# Patient Record
Sex: Female | Born: 1955 | Race: White | Hispanic: No | Marital: Single | Smoking: Former smoker
Health system: Southern US, Community
[De-identification: ages and names within clinical notes are randomized; demographics above are authoritative.]

## PROBLEM LIST (undated history)

## (undated) DIAGNOSIS — R7989 Other specified abnormal findings of blood chemistry: Secondary | ICD-10-CM

## (undated) DIAGNOSIS — R0602 Shortness of breath: Secondary | ICD-10-CM

## (undated) DIAGNOSIS — L88 Pyoderma gangrenosum: Secondary | ICD-10-CM

## (undated) DIAGNOSIS — I10 Essential (primary) hypertension: Secondary | ICD-10-CM

## (undated) DIAGNOSIS — E785 Hyperlipidemia, unspecified: Secondary | ICD-10-CM

## (undated) DIAGNOSIS — R002 Palpitations: Secondary | ICD-10-CM

## (undated) DIAGNOSIS — E221 Hyperprolactinemia: Secondary | ICD-10-CM

## (undated) DIAGNOSIS — R945 Abnormal results of liver function studies: Secondary | ICD-10-CM

## (undated) DIAGNOSIS — I421 Obstructive hypertrophic cardiomyopathy: Secondary | ICD-10-CM

## (undated) DIAGNOSIS — E669 Obesity, unspecified: Secondary | ICD-10-CM

## (undated) DIAGNOSIS — R079 Chest pain, unspecified: Secondary | ICD-10-CM

## (undated) HISTORY — DX: Obesity, unspecified: E66.9

## (undated) HISTORY — DX: Palpitations: R00.2

## (undated) HISTORY — DX: Essential (primary) hypertension: I10

## (undated) HISTORY — DX: Other specified abnormal findings of blood chemistry: R79.89

## (undated) HISTORY — DX: Shortness of breath: R06.02

## (undated) HISTORY — DX: Hyperlipidemia, unspecified: E78.5

## (undated) HISTORY — DX: Abnormal results of liver function studies: R94.5

## (undated) HISTORY — DX: Pyoderma gangrenosum: L88

## (undated) HISTORY — DX: Hyperprolactinemia: E22.1

## (undated) HISTORY — DX: Obstructive hypertrophic cardiomyopathy: I42.1

## (undated) HISTORY — DX: Chest pain, unspecified: R07.9

---

## 2004-01-18 HISTORY — PX: SKIN GRAFT: SHX250

## 2010-09-23 ENCOUNTER — Ambulatory Visit (INDEPENDENT_AMBULATORY_CARE_PROVIDER_SITE_OTHER): Payer: BC Managed Care – PPO | Admitting: Internal Medicine

## 2010-09-23 ENCOUNTER — Encounter: Payer: Self-pay | Admitting: Internal Medicine

## 2010-09-23 DIAGNOSIS — E785 Hyperlipidemia, unspecified: Secondary | ICD-10-CM | POA: Insufficient documentation

## 2010-09-23 DIAGNOSIS — IMO0001 Reserved for inherently not codable concepts without codable children: Secondary | ICD-10-CM

## 2010-09-23 DIAGNOSIS — L88 Pyoderma gangrenosum: Secondary | ICD-10-CM

## 2010-09-23 DIAGNOSIS — Z136 Encounter for screening for cardiovascular disorders: Secondary | ICD-10-CM

## 2010-09-23 DIAGNOSIS — R002 Palpitations: Secondary | ICD-10-CM | POA: Insufficient documentation

## 2010-09-23 DIAGNOSIS — E221 Hyperprolactinemia: Secondary | ICD-10-CM

## 2010-09-23 DIAGNOSIS — Z0000 Encounter for general adult medical examination without abnormal findings: Secondary | ICD-10-CM

## 2010-09-23 DIAGNOSIS — E782 Mixed hyperlipidemia: Secondary | ICD-10-CM | POA: Insufficient documentation

## 2010-09-23 LAB — CBC WITH DIFFERENTIAL/PLATELET
Basophils Absolute: 0 10*3/uL (ref 0.0–0.1)
Eosinophils Relative: 1.2 % (ref 0.0–5.0)
HCT: 39 % (ref 36.0–46.0)
Hemoglobin: 13.1 g/dL (ref 12.0–15.0)
Lymphocytes Relative: 32.8 % (ref 12.0–46.0)
Lymphs Abs: 2 10*3/uL (ref 0.7–4.0)
Monocytes Relative: 6.3 % (ref 3.0–12.0)
Neutro Abs: 3.6 10*3/uL (ref 1.4–7.7)
RBC: 4.14 Mil/uL (ref 3.87–5.11)
RDW: 14.9 % — ABNORMAL HIGH (ref 11.5–14.6)
WBC: 6.1 10*3/uL (ref 4.5–10.5)

## 2010-09-23 LAB — COMPREHENSIVE METABOLIC PANEL
ALT: 49 U/L — ABNORMAL HIGH (ref 0–35)
AST: 29 U/L (ref 0–37)
Alkaline Phosphatase: 74 U/L (ref 39–117)
Calcium: 9.4 mg/dL (ref 8.4–10.5)
GFR: 96.26 mL/min (ref >60.00)
Potassium: 4.5 mEq/L (ref 3.5–5.1)
Sodium: 139 mEq/L (ref 135–145)

## 2010-09-23 MED ORDER — NITROGLYCERIN 0.4 MG SL SUBL: SUBLINGUAL_TABLET | SUBLINGUAL | Status: DC

## 2010-09-23 NOTE — Assessment & Plan Note (Addendum)
3 months history of palpitations, last episode was 09-11-2010. Symptoms are associated with chest pain. EKG today w/o acute changes and no different from old one  Plan: Chest x-ray Cardiology referral for further evaluation. Labs Start aspirin 81 mg daily, nitroglycerin when necessary. ER if symptoms severe or persistent more than 10 minutes

## 2010-09-23 NOTE — Patient Instructions (Signed)
Aspirin 1 a day Nitroglycerine as needed ER if symptoms severe, persist > 10 minutes

## 2010-09-23 NOTE — Progress Notes (Signed)
  Subjective:    Patient ID: Christine Jacobson, female    DOB: 09-26-1955, 55 y.o.   MRN: [id number]  HPI New patient Was doing very good up until May 2012 when she not increase weight despite no change in her diet, she is also having on and off palpitations along with a "rush feeling"  to the neck; symptoms are both at rest and with physical activity, has associated shortness of breath and a chest pain (sharp, left-sided) with some radiation to the left arm which is not happening every time. Symptoms lasted 5-10 minutes and self subsided. Has noted that drinking  red wine helps . No associated diaphoresis or nausea. Reports that the symptoms are getting more frequent although she cannot tell me how often. Last severe episode was 09-11-10. She mentioned she is very anxious today, but strongly denies depression; stressin her life is not particularly bad at the present time. She brought  a number of records from her previous PCP: 04-2009 had a cholesterol panel (scanned) she was seen in 06/2009--had  atypical chest pain, EKG then showed a normal sinus rhythm, she declined a referral to cardiology for further evaluation due to cost   Past Medical History  Diagnosis Date  . Mild hyperlipidemia   . Elevated BP   . Liver function test abnormality     normal when repeated  . Hyperprolactinemia     dx in her 50, took meds, self d/c a while back  . Pyoderma gangrenosa    Past Surgical History  Procedure Date  . Skin graft 2006    porcine, R leg   History   Social History  . Marital Status: Single    Spouse Name: N/A    Number of Children: 0  . Years of Education: N/A   Occupational History  . para Psychologist, forensic, part time cook    Social History Main Topics  . Smoking status: Never Smoker   . Smokeless tobacco: Not on file  . Alcohol Use: Yes     wine  . Drug Use: No  . Sexually Active: Not on file   Other Topics Concern  . Not on file   Social History Narrative   Lives by self    Family History  Problem Relation Age of Onset  . Cancer Neg Hx   . Coronary artery disease      F in his 37  . Diabetes      GP     Review of Systems Denies any fever or cough No GERD-type symptoms, no abd pain  Oral extremity edema, nor recent airplane trip. Not taking any new medicines, in fact she only takes ibuprofen  Weight has increased lately.     Objective:   Physical Exam  Constitutional: She appears well-developed and well-nourished. No distress.  HENT:  Head: Normocephalic and atraumatic.  Neck: No thyromegaly present.  Cardiovascular: Normal rate, regular rhythm and normal heart sounds.   No murmur heard. Pulmonary/Chest: Breath sounds normal. No respiratory distress. She has no wheezes. She has no rales.  Abdominal: Soft. Bowel sounds are normal. She exhibits no distension. There is no tenderness. There is no rebound and no guarding.  Musculoskeletal: She exhibits no edema.  Skin: Skin is warm and dry. She is not diaphoretic.  Psychiatric:       Not depression, she seems slightly anxious, seems to have a  "type A. personality          Assessment & Plan:

## 2010-10-13 ENCOUNTER — Ambulatory Visit (INDEPENDENT_AMBULATORY_CARE_PROVIDER_SITE_OTHER)
Admission: RE | Admit: 2010-10-13 | Discharge: 2010-10-13 | Disposition: A | Discharge status: Home / Self Care | Payer: BC Managed Care – PPO | Source: Ambulatory Visit | Attending: Internal Medicine | Admitting: Internal Medicine

## 2010-10-13 DIAGNOSIS — R002 Palpitations: Secondary | ICD-10-CM

## 2010-10-14 ENCOUNTER — Telehealth: Payer: Self-pay | Admitting: *Deleted

## 2010-10-14 NOTE — Telephone Encounter (Signed)
Patient called stating that she was told by the Xray technician that her results would be ready and that she could get them 'in 2 hours' when she had the test done yesterday (10/13/10).? Called patient with apology for this misleading information given and informed her that we would contact her as soon as results were noted and signed off/released by physician.

## 2010-10-18 ENCOUNTER — Encounter: Payer: Self-pay | Admitting: Cardiology

## 2010-10-18 ENCOUNTER — Ambulatory Visit (INDEPENDENT_AMBULATORY_CARE_PROVIDER_SITE_OTHER): Payer: BC Managed Care – PPO | Admitting: Cardiology

## 2010-10-18 DIAGNOSIS — E785 Hyperlipidemia, unspecified: Secondary | ICD-10-CM

## 2010-10-18 DIAGNOSIS — R011 Cardiac murmur, unspecified: Secondary | ICD-10-CM

## 2010-10-18 DIAGNOSIS — R079 Chest pain, unspecified: Secondary | ICD-10-CM

## 2010-10-18 DIAGNOSIS — R002 Palpitations: Secondary | ICD-10-CM

## 2010-10-18 NOTE — Assessment & Plan Note (Signed)
Possible mild aortic stenosis murmur on examination. Note no change with Valsalva. Schedule echocardiogram.

## 2010-10-18 NOTE — Progress Notes (Signed)
HPI: 55 year old female for evaluation of chest pain and palpitations. TSH and potassium normal in September 2012. Patient states that for the last 2 months she has had intermittent chest pain. Her first 2 episodes were initiated by her heart racing followed by a squeezing sensation in the substernal area. Those feelings radiated to her head. There was no associated nausea, shortness of breath or diaphoresis. The symptoms lasted approximately 5 minutes and resolved spontaneously. Since that time she notes dyspnea and chest tightness with more extreme activities such as climbing stairs or walking up a hill. Note this is new compared to previous as she was very active and had no symptoms previously. Her chest pain resolves with slowing down.  Current Outpatient Prescriptions  Medication Sig Dispense Refill  . aspirin 81 MG tablet Take 81 mg by mouth daily.        . Ibuprofen 200 MG CAPS Take by mouth as needed.        . nitroGLYCERIN (NITROSTAT) 0.4 MG SL tablet 1 sublingual every 5 minutes 3 times if chest pain  20 tablet  1    No Known Allergies  Past Medical History  Diagnosis Date  . Mild hyperlipidemia   . Liver function test abnormality     normal when repeated  . Hyperprolactinemia     dx in her 1, took meds, self d/c a while back  . Pyoderma gangrenosa     Past Surgical History  Procedure Date  . Skin graft 2006    porcine, R leg    History   Social History  . Marital Status: Single    Spouse Name: N/A    Number of Children: 0  . Years of Education: N/A   Occupational History  . para Psychologist, forensic, part time cook    Social History Main Topics  . Smoking status: Never Smoker   . Smokeless tobacco: Not on file  . Alcohol Use: Yes     wine  . Drug Use: No  . Sexually Active: Not on file   Other Topics Concern  . Not on file   Social History Narrative   Lives by self    Family History  Problem Relation Age of Onset  . Cancer Neg Hx   . Coronary artery  disease      F in his 4  . Diabetes      GP  . Heart disease Mother     endocarditis    ROS: no fevers or chills, productive cough, hemoptysis, dysphasia, odynophagia, melena, hematochezia, dysuria, hematuria, rash, seizure activity, orthopnea, PND, pedal edema, claudication. Remaining systems are negative.  Physical Exam: General:  Well developed/well nourished in NAD Skin warm/dry Patient not depressed No peripheral clubbing Back-normal HEENT-normal/normal eyelids Neck supple/normal carotid upstroke bilaterally; no bruits; no JVD; no thyromegaly chest - CTA/ normal expansion CV - RRR/normal S1 and S2; no rubs or gallops;  PMI nondisplaced; 2/6 systolic murmur left sternal border. No change with Valsalva. Abdomen -NT/ND, no HSM, no mass, + bowel sounds, no bruit 2+ femoral pulses, no bruits Ext-no edema, chords, 2+ DP Neuro-grossly nonfocal  ECG 09/23/10 sinus rhythm with no ST changes.

## 2010-10-18 NOTE — Assessment & Plan Note (Signed)
Management per primary care. 

## 2010-10-18 NOTE — Patient Instructions (Signed)
Your physician has requested that you have a stress echocardiogram. For further information please visit [email or web address] Please follow instruction sheet as given.   Your physician has requested that you have an echocardiogram. Echocardiography is a painless test that uses sound waves to create images of your heart. It provides your doctor with information about the size and shape of your heart and how well your heart's chambers and valves are working. This procedure takes approximately one hour. There are no restrictions for this procedure.

## 2010-10-18 NOTE — Assessment & Plan Note (Signed)
If her symptoms worsen Will schedule [facility].

## 2010-10-18 NOTE — Assessment & Plan Note (Signed)
Patient's symptoms are concerning. Her initial symptoms were less typical but she is now having exertional chest pain relieved with rest. We discussed the options today including catheterization versus functional study. She would prefer conservative measures if possible. We'll begin with stress echocardiogram and have low threshold for cardiac catheterization. Continue aspirin.

## 2010-10-19 ENCOUNTER — Telehealth: Payer: Self-pay | Admitting: Cardiology

## 2010-10-19 NOTE — Telephone Encounter (Signed)
Left message for pt to call back to make appt to see dr Jens Som in 4-6 weeks Deliah Goody

## 2010-10-19 NOTE — Telephone Encounter (Signed)
Pt called pt returning your call

## 2010-11-02 ENCOUNTER — Telehealth: Payer: Self-pay | Admitting: Cardiology

## 2010-11-02 NOTE — Telephone Encounter (Signed)
Spoke with pt who is concerned about any injections she may receive during stress echo due to recent meningitis reports in patients receiving spinal injections.  I reviewed with Weston Brass, PharmD and any IV contrast pt may need during stress echo is not related to the medications involved in meningitis cases. Pt given this information.

## 2010-11-02 NOTE — Telephone Encounter (Signed)
Pt would like to speak with nurse regarding upcoming appt w/ MD. Please call back.

## 2010-11-03 ENCOUNTER — Encounter: Payer: Self-pay | Admitting: *Deleted

## 2010-11-08 ENCOUNTER — Other Ambulatory Visit (HOSPITAL_COMMUNITY): Payer: BC Managed Care – PPO | Admitting: Radiology

## 2010-11-08 ENCOUNTER — Telehealth (HOSPITAL_COMMUNITY): Payer: Self-pay | Admitting: Cardiology

## 2010-11-08 DIAGNOSIS — R002 Palpitations: Secondary | ICD-10-CM

## 2010-11-08 NOTE — Telephone Encounter (Signed)
Pt called and for the second night in a row her heart was pounding/woke from a sleep.  No chest pain/no shortness of breath, just rapid rate.  Last for about 5 minutes.  Does not occur during day.  She is concerned.  Please call her back.

## 2010-11-08 NOTE — Telephone Encounter (Signed)
Spoke with patient and she is having stress echo on Monday.  Reviewed dictation of Dr Ludwig Clarks and he stated if symptoms continue will get an event monitor.  Advised patient we could go ahead and proceed with this, she stated she would just have them put it on day of stress echo.

## 2010-11-15 ENCOUNTER — Other Ambulatory Visit (HOSPITAL_COMMUNITY): Payer: BC Managed Care – PPO | Admitting: Radiology

## 2010-11-22 ENCOUNTER — Other Ambulatory Visit (HOSPITAL_COMMUNITY): Payer: BC Managed Care – PPO | Admitting: Radiology

## 2010-11-22 ENCOUNTER — Ambulatory Visit (HOSPITAL_BASED_OUTPATIENT_CLINIC_OR_DEPARTMENT_OTHER): Payer: BC Managed Care – PPO | Admitting: Radiology

## 2010-11-22 ENCOUNTER — Ambulatory Visit (HOSPITAL_COMMUNITY): Payer: BC Managed Care – PPO | Attending: Cardiology | Admitting: Radiology

## 2010-11-22 ENCOUNTER — Other Ambulatory Visit (HOSPITAL_COMMUNITY): Payer: Self-pay | Admitting: Cardiology

## 2010-11-22 ENCOUNTER — Encounter (INDEPENDENT_AMBULATORY_CARE_PROVIDER_SITE_OTHER): Payer: BC Managed Care – PPO

## 2010-11-22 DIAGNOSIS — R0609 Other forms of dyspnea: Secondary | ICD-10-CM | POA: Insufficient documentation

## 2010-11-22 DIAGNOSIS — R011 Cardiac murmur, unspecified: Secondary | ICD-10-CM

## 2010-11-22 DIAGNOSIS — R079 Chest pain, unspecified: Secondary | ICD-10-CM

## 2010-11-22 DIAGNOSIS — R0989 Other specified symptoms and signs involving the circulatory and respiratory systems: Secondary | ICD-10-CM | POA: Insufficient documentation

## 2010-11-22 DIAGNOSIS — E785 Hyperlipidemia, unspecified: Secondary | ICD-10-CM | POA: Insufficient documentation

## 2010-11-22 DIAGNOSIS — R002 Palpitations: Secondary | ICD-10-CM

## 2010-11-22 DIAGNOSIS — R000 Tachycardia, unspecified: Secondary | ICD-10-CM | POA: Insufficient documentation

## 2010-11-22 DIAGNOSIS — R072 Precordial pain: Secondary | ICD-10-CM

## 2010-11-22 HISTORY — DX: Chest pain, unspecified: R07.9

## 2010-11-23 ENCOUNTER — Encounter: Payer: Self-pay | Admitting: Cardiology

## 2010-11-23 ENCOUNTER — Ambulatory Visit (INDEPENDENT_AMBULATORY_CARE_PROVIDER_SITE_OTHER): Payer: BC Managed Care – PPO | Admitting: Cardiology

## 2010-11-23 DIAGNOSIS — I10 Essential (primary) hypertension: Secondary | ICD-10-CM

## 2010-11-23 DIAGNOSIS — R011 Cardiac murmur, unspecified: Secondary | ICD-10-CM

## 2010-11-23 DIAGNOSIS — R002 Palpitations: Secondary | ICD-10-CM

## 2010-11-23 MED ORDER — METOPROLOL SUCCINATE ER 25 MG PO TB24: 25.0000 mg | ORAL_TABLET | Freq: Every day | ORAL | Status: DC

## 2010-11-23 NOTE — Assessment & Plan Note (Signed)
Symptoms improved and stress test normal. We will follow for now.

## 2010-11-23 NOTE — Assessment & Plan Note (Signed)
Patient's blood pressure was elevated at time of stress test and remains elevated. He is also noted to have left ventricular hypertrophy. Begin Toprol 25 mg daily and increase as needed. This should also help with palpitations.

## 2010-11-23 NOTE — Assessment & Plan Note (Signed)
This appears to be related to proximal septal thickening with narrow LVOT. Mild gradient at rest. Add Toprol and we'll plan repeat echoes in the future. I do not think this is consistent with hypertrophic cardio myopathy.

## 2010-11-23 NOTE — Progress Notes (Signed)
HPI: Pleasant female I initially saw in Oct of 2012 for evaluation of chest pain and palpitations. TSH and potassium normal in September 2012. I felt the symptoms were concerning. Patient elected noninvasive evaluation initially. Stress echo performed Nov 2012 revealed no chest pain, no ST changes and no stress-induced wall motion abnormalities; note hypertensive response. Echo in Nov 2012 revealed Normal LV function; mild LVH, proximal septal thickening with narrow LVOT and mildly elevated gradient of 2.1 m/s; mild MR and TR. Patient also now wearing cardionet for palpitations. Since I last saw her, she denies dyspnea on exertion, orthopnea, PND or syncope. Occasional palpitations. She had an episode of chest pain during a stressful situation several days ago. Otherwise no chest pain.   Current Outpatient Prescriptions  Medication Sig Dispense Refill  . aspirin 81 MG tablet Take 81 mg by mouth daily.        . Ibuprofen 200 MG CAPS Take by mouth as needed.        . nitroGLYCERIN (NITROSTAT) 0.4 MG SL tablet 1 sublingual every 5 minutes 3 times if chest pain  20 tablet  1     Past Medical History  Diagnosis Date  . Mild hyperlipidemia   . Liver function test abnormality     normal when repeated  . Hyperprolactinemia     dx in her 45, took meds, self d/c a while back  . Pyoderma gangrenosa     Past Surgical History  Procedure Date  . Skin graft 2006    porcine, R leg    History   Social History  . Marital Status: Single    Spouse Name: N/A    Number of Children: 0  . Years of Education: N/A   Occupational History  . para Psychologist, forensic, part time cook    Social History Main Topics  . Smoking status: Never Smoker   . Smokeless tobacco: Not on file  . Alcohol Use: Yes     wine  . Drug Use: No  . Sexually Active: Not on file   Other Topics Concern  . Not on file   Social History Narrative   Lives by self    ROS: no fevers or chills, productive cough, hemoptysis,  dysphasia, odynophagia, melena, hematochezia, dysuria, hematuria, rash, seizure activity, orthopnea, PND, pedal edema, claudication. Remaining systems are negative.  Physical Exam: Well-developed well-nourished in no acute distress.  Skin is warm and dry.  HEENT is normal.  Neck is supple. No thyromegaly.  Chest is clear to auscultation with normal expansion.  Cardiovascular exam is regular rate and rhythm. 2/6 systolic murmur left sternal border that increases with Valsalva. Abdominal exam nontender or distended. No masses palpated. Extremities show no edema. neuro grossly intact

## 2010-11-23 NOTE — Patient Instructions (Signed)
Your physician recommends that you schedule a follow-up appointment in: 6 to 8 weeks  START METOPROLOL SUCC. 25MG  ONCE DAILY

## 2010-11-23 NOTE — Assessment & Plan Note (Signed)
Await results of cardionet. Add Toprol.

## 2010-11-26 ENCOUNTER — Telehealth: Payer: Self-pay | Admitting: Cardiology

## 2010-11-26 NOTE — Telephone Encounter (Signed)
Pt calling re medication, has questions

## 2010-11-26 NOTE — Telephone Encounter (Signed)
Spoke with pt, she is concerned about the side effects listed with the toprol. She wants to try taking 1/2 tablet to start. Pt given the okay to start at 1/2 tablet daily. She will call with problems Christine Jacobson

## 2010-12-13 ENCOUNTER — Telehealth: Payer: Self-pay | Admitting: Cardiology

## 2010-12-13 NOTE — Telephone Encounter (Signed)
Pt wants to stop wearing monitor, she has been feeling fine and wants to stop. Informed her Dr Jens Som not in office today and I would forward to his nurse and she can be informed tomorrow, pt ok with information.

## 2010-12-13 NOTE — Telephone Encounter (Signed)
New problem She has questions about her heart monitor please call

## 2010-12-13 NOTE — Telephone Encounter (Signed)
Would prefer patient to complete monitor if she is willling Christine Jacobson

## 2010-12-14 NOTE — Telephone Encounter (Signed)
Left message for pt of dr crenshaw's recommendations Christine Jacobson  

## 2010-12-21 ENCOUNTER — Telehealth: Payer: Self-pay | Admitting: Cardiology

## 2010-12-21 NOTE — Telephone Encounter (Signed)
Spoke with pt, questions regarding dental work answered Christine Jacobson

## 2010-12-21 NOTE — Telephone Encounter (Signed)
Pt calling re update prior to dental work

## 2010-12-29 ENCOUNTER — Telehealth: Payer: Self-pay | Admitting: *Deleted

## 2010-12-29 NOTE — Telephone Encounter (Signed)
Left message for pt, monitor reviewed by dr Jens Som shows sinus with rare PAC

## 2010-12-30 ENCOUNTER — Telehealth: Payer: Self-pay | Admitting: Cardiology

## 2010-12-30 NOTE — Telephone Encounter (Signed)
Spoke with pt, questions answered.

## 2010-12-30 NOTE — Telephone Encounter (Signed)
Fu Call: Pt returning call to Stanton Kidney, wanting to ask another question. Please return pt call to discuss further.

## 2011-01-03 ENCOUNTER — Ambulatory Visit: Payer: BC Managed Care – PPO | Admitting: Cardiology

## 2011-01-21 ENCOUNTER — Ambulatory Visit: Payer: BC Managed Care – PPO | Admitting: Cardiology

## 2011-02-08 ENCOUNTER — Encounter: Payer: Self-pay | Admitting: Cardiology

## 2011-02-08 ENCOUNTER — Encounter: Payer: Self-pay | Admitting: *Deleted

## 2011-02-08 ENCOUNTER — Ambulatory Visit (INDEPENDENT_AMBULATORY_CARE_PROVIDER_SITE_OTHER): Payer: BC Managed Care – PPO | Admitting: Cardiology

## 2011-02-08 VITALS — BP 140/82 | Ht 64.0 in | Wt 209.0 lb

## 2011-02-08 DIAGNOSIS — R011 Cardiac murmur, unspecified: Secondary | ICD-10-CM

## 2011-02-08 DIAGNOSIS — R002 Palpitations: Secondary | ICD-10-CM

## 2011-02-08 DIAGNOSIS — R079 Chest pain, unspecified: Secondary | ICD-10-CM

## 2011-02-08 DIAGNOSIS — I10 Essential (primary) hypertension: Secondary | ICD-10-CM

## 2011-02-08 NOTE — Assessment & Plan Note (Signed)
Most likely from proximal septal thickening and mild LVOT gradient. Continue beta blocker.

## 2011-02-08 NOTE — Patient Instructions (Signed)
Your physician wants you to follow-up in: 6 MONTHS You will receive a reminder letter in the mail two months in advance. If you don't receive a letter, please call our office to schedule the follow-up appointment. 

## 2011-02-08 NOTE — Assessment & Plan Note (Signed)
No further symptoms. Stress echo negative. No further evaluation at this time.

## 2011-02-08 NOTE — Assessment & Plan Note (Signed)
Blood pressure controlled. Continue present medications. 

## 2011-02-08 NOTE — Progress Notes (Signed)
   [id number] female I initially saw in Oct of 2012 for evaluation of chest pain and palpitations. TSH and potassium normal in September 2012. I felt the symptoms were concerning. Patient elected noninvasive evaluation initially. Stress echo performed Nov 2012 revealed no chest pain, no ST changes and no stress-induced wall motion abnormalities; note hypertensive response. Echo in Nov 2012 revealed Normal LV function; mild LVH, proximal septal thickening with narrow LVOT and mildly elevated gradient of 2.1 m/s; mild MR and TR. Cardionet in Nov 2012 revealed sinus with pacs. Since I last saw her, she denies dyspnea on exertion, orthopnea, PND or syncope. No chest pain. Palpitations improved.   Current Outpatient Prescriptions  Medication Sig Dispense Refill  . aspirin 81 MG tablet Take 81 mg by mouth daily.        . Ibuprofen 200 MG CAPS Take by mouth as needed.        . metoprolol succinate (TOPROL XL) 25 MG 24 hr tablet Take 1 tablet (25 mg total) by mouth daily.  30 tablet  11  . nitroGLYCERIN (NITROSTAT) 0.4 MG SL tablet 1 sublingual every 5 minutes 3 times if chest pain  20 tablet  1     Past Medical History  Diagnosis Date  . Mild hyperlipidemia   . Liver function test abnormality     normal when repeated  . Hyperprolactinemia     dx in her 50, took meds, self d/c a while back  . Pyoderma gangrenosa     Past Surgical History  Procedure Date  . Skin graft 2006    porcine, R leg    History   Social History  . Marital Status: Single    Spouse Name: N/A    Number of Children: 0  . Years of Education: N/A   Occupational History  . para Psychologist, forensic, part time cook    Social History Main Topics  . Smoking status: Never Smoker   . Smokeless tobacco: Not on file  . Alcohol Use: Yes     wine  . Drug Use: No  . Sexually Active: Not on file   Other Topics Concern  . Not on file   Social History Narrative   Lives by self    ROS: no fevers or chills, productive  cough, hemoptysis, dysphasia, odynophagia, melena, hematochezia, dysuria, hematuria, rash, seizure activity, orthopnea, PND, pedal edema, claudication. Remaining systems are negative.  Physical Exam: Well-developed well-nourished in no acute distress.  Skin is warm and dry.  HEENT is normal.  Neck is supple. No thyromegaly.  Chest is clear to auscultation with normal expansion.  Cardiovascular exam is regular rate and rhythm. 2/6 systolic murmur left sternal border Abdominal exam nontender or distended. No masses palpated. Extremities show no edema. neuro grossly intact  ECG sinus bradycardia at a rate of 55. Left ventricular hypertrophy. RV conduction delay. Nonspecific ST changes.

## 2011-02-08 NOTE — Assessment & Plan Note (Signed)
Monitor showed PACs. Symptoms improved. Continue beta blocker.

## 2011-02-23 ENCOUNTER — Telehealth: Payer: Self-pay | Admitting: Cardiology

## 2011-02-23 NOTE — Telephone Encounter (Signed)
Left message at home number for pt to call back to discuss symptoms with Toprol.

## 2011-02-23 NOTE — Telephone Encounter (Signed)
New Problem   Patient thinks she is having an adverse reaction to medication to Toprolol, experiencing fatigue, nausea, and heart burn.  Please return call to patient on wk# [phone]

## 2011-02-24 ENCOUNTER — Telehealth: Payer: Self-pay | Admitting: *Deleted

## 2011-02-24 NOTE — Telephone Encounter (Signed)
02/24/11--0930--phoned pt to dicuss problem with metoprolol--pt states she has been taking this med for along time and has never had any reactions before--she states she has been under a lot of pressure at her job lately and feels this is reason she felt poorly that day--she is feeling OK now--no nausea etc. --advised to call back if future problems--pt agrees--nt

## 2011-04-25 ENCOUNTER — Telehealth: Payer: Self-pay | Admitting: Cardiology

## 2011-04-25 NOTE — Telephone Encounter (Signed)
Spoke with pt, she is taking the toprol daily. She will cont and if something bothers her she will call.

## 2011-04-25 NOTE — Telephone Encounter (Signed)
Palpitations may worsen off toprol; continue if possible Olga Millers

## 2011-04-25 NOTE — Telephone Encounter (Signed)
New problem:  C/O having side effect  from medication- toprol XL  25 mg. For the past couple of days.

## 2011-04-25 NOTE — Telephone Encounter (Signed)
Spoke with pt, for the last couple days she c/o not being able to sleep, her heart racing and extreme fatigue. She thinks this is related to the toprol. She reports she does not take the toprol daily. Will forward for dr Jens Som review

## 2011-05-25 ENCOUNTER — Telehealth: Payer: Self-pay | Admitting: Cardiology

## 2011-05-25 NOTE — Telephone Encounter (Signed)
Spoke with pt, she has tried to cont the metoprolol  But she is having SOB with exertion, some chest discomfort and also anxiety about the metoprolol. She will see lori gerhardt np tomorrow to discuss her symptoms and metoprolol.

## 2011-05-25 NOTE — Telephone Encounter (Signed)
New msg Pt said she is taking metoprolol. She is having side effects from taking this med. Please call

## 2011-05-26 ENCOUNTER — Ambulatory Visit (INDEPENDENT_AMBULATORY_CARE_PROVIDER_SITE_OTHER): Payer: BC Managed Care – PPO | Admitting: Nurse Practitioner

## 2011-05-26 ENCOUNTER — Encounter: Payer: Self-pay | Admitting: Nurse Practitioner

## 2011-05-26 ENCOUNTER — Telehealth: Payer: Self-pay | Admitting: Cardiology

## 2011-05-26 DIAGNOSIS — R079 Chest pain, unspecified: Secondary | ICD-10-CM

## 2011-05-26 DIAGNOSIS — I10 Essential (primary) hypertension: Secondary | ICD-10-CM

## 2011-05-26 LAB — BASIC METABOLIC PANEL
BUN: 18 mg/dL (ref 6–23)
CO2: 26 mEq/L (ref 19–32)
Calcium: 9.6 mg/dL (ref 8.4–10.5)
Chloride: 106 mEq/L (ref 96–112)
Creatinine, Ser: 0.8 mg/dL (ref 0.4–1.2)
GFR: 84.31 mL/min (ref >60.00)
Glucose, Bld: 98 mg/dL (ref 70–99)
Potassium: 4 mEq/L (ref 3.5–5.1)
Sodium: 140 mEq/L (ref 135–145)

## 2011-05-26 LAB — CK TOTAL AND CKMB (NOT AT ARMC)
CK, MB: 4 ng/mL (ref 0.3–4.0)
Relative Index: 3.6 — ABNORMAL HIGH (ref 0.0–2.5)
Total CK: 110 U/L (ref 7–177)

## 2011-05-26 LAB — CBC WITH DIFFERENTIAL/PLATELET
Basophils Absolute: 0.1 10*3/uL (ref 0.0–0.1)
Basophils Relative: 0.8 % (ref 0.0–3.0)
Eosinophils Absolute: 0.1 10*3/uL (ref 0.0–0.7)
Eosinophils Relative: 1.6 % (ref 0.0–5.0)
HCT: 39.9 % (ref 36.0–46.0)
Hemoglobin: 13.4 g/dL (ref 12.0–15.0)
Lymphocytes Relative: 31.1 % (ref 12.0–46.0)
Lymphs Abs: 1.9 10*3/uL (ref 0.7–4.0)
MCHC: 33.5 g/dL (ref 30.0–36.0)
MCV: 92.4 fl (ref 78.0–100.0)
Monocytes Absolute: 0.4 10*3/uL (ref 0.1–1.0)
Monocytes Relative: 7 % (ref 3.0–12.0)
Neutro Abs: 3.6 10*3/uL (ref 1.4–7.7)
Neutrophils Relative %: 59.5 % (ref 43.0–77.0)
Platelets: 207 10*3/uL (ref 150.0–400.0)
RBC: 4.32 Mil/uL (ref 3.87–5.11)
RDW: 14.7 % — ABNORMAL HIGH (ref 11.5–14.6)
WBC: 6 10*3/uL (ref 4.5–10.5)

## 2011-05-26 LAB — LIPID PANEL
Cholesterol: 229 mg/dL — ABNORMAL HIGH (ref 0–200)
HDL: 100.9 mg/dL (ref >39.00)
Total CHOL/HDL Ratio: 2
Triglycerides: 59 mg/dL (ref 0.0–149.0)
VLDL: 11.8 mg/dL (ref 0.0–40.0)

## 2011-05-26 LAB — HEPATIC FUNCTION PANEL
ALT: 128 U/L — ABNORMAL HIGH (ref 0–35)
AST: 83 U/L — ABNORMAL HIGH (ref 0–37)
Albumin: 3.8 g/dL (ref 3.5–5.2)
Alkaline Phosphatase: 111 U/L (ref 39–117)
Bilirubin, Direct: 0.1 mg/dL (ref 0.0–0.3)
Total Bilirubin: 0.7 mg/dL (ref 0.3–1.2)
Total Protein: 7.1 g/dL (ref 6.0–8.3)

## 2011-05-26 LAB — APTT: aPTT: 24.6 s (ref 21.7–28.8)

## 2011-05-26 LAB — PROTIME-INR
INR: 0.9 ratio (ref 0.8–1.0)
Prothrombin Time: 9.7 s — ABNORMAL LOW (ref 10.2–12.4)

## 2011-05-26 LAB — LDL CHOLESTEROL, DIRECT: Direct LDL: 121.8 mg/dL

## 2011-05-26 MED ORDER — ISOSORBIDE MONONITRATE ER 30 MG PO TB24: 30.0000 mg | ORAL_TABLET | Freq: Every day | ORAL | Status: DC

## 2011-05-26 NOTE — Assessment & Plan Note (Signed)
Blood pressure is elevated here today. Imdur was started. She will need further evaluation and management. She is quite anxious at today's visit which may be contributing.

## 2011-05-26 NOTE — Patient Instructions (Signed)
Cardiac catheterization is recommended  We are going to start Imdur 30 mg daily   Use your NTG under your tongue for recurrent chest pain. May take one tablet every 5 minutes. If you are still having discomfort after 3 tablets in 15 minutes, call 911.  Call Deliah Goody as soon as you make your decision regarding this procedure.  Call the [facility] office at [phone] if you have any questions, problems or concerns.

## 2011-05-26 NOTE — Telephone Encounter (Signed)
Pt saw lori gerhardt today, who suggested she have c cath to explain reason for angina--pt refused at the visit ,but later called back to sched cath--cath is sched for 5/21 with dr jordon --pt wanted to wait so she could get her work place covered--labs are already drawn and i have advised her to come in on Friday 5/10, so someone can go over instructions with her--i felt this should be done in person as pt seems extremely nervous, and i'm afraid she won't retain much over phone--paper work is with Associate Professor poole and copy is with kelly lanier--pt agrees to come for instructions--nt

## 2011-05-26 NOTE — Progress Notes (Signed)
Christine Jacobson Date of Birth: [birth year] Medical Record #[id number]  History of Present Illness: Christine Jacobson is seen today for a work in visit. She is seen for Dr. Jens Som. She has a history of HLD and palpitations. She has had exertional chest pain last fall and she elected a noninvasive approach of evaluation. Dr. Jens Som had offered cardiac cath and thought her symptoms were concerning. She has been evaluated with a stress echo back in November of 2012. She has been maintained on beta blocker therapy. She has called several times over the past few months thinking she was not tolerating the Toprol. She has been off and on the Toprol but is currently taking it at night.   She comes in today. She is here alone. She spoke to [place] yesterday and reported SOB with exertion, recurrent chest discomfort and some anxiety about the metoprolol. She has not felt well for at least the last 2 weeks. She says she is having this "difficult" chest pain that comes on with exertion. She cannot walk even a block now. She has been trying to exercise but has to stop due to the discomfort. She will get short of breath. No symptoms at rest. One day, she was walking down [address] to the [place] just a short distance and again had to stop. A police officer actually stopped and wanted to see if she wanted an ambulance. She admits to lots of stress at work. She is not on cholesterol medicine. Blood pressure is elevated today. She does not check at home. She has used NTG x 1 last week with relief.   Current Outpatient Prescriptions on File Prior to Visit  Medication Sig Dispense Refill  . aspirin 81 MG tablet Take 81 mg by mouth daily.        . Ibuprofen 200 MG CAPS Take by mouth as needed.        . metoprolol succinate (TOPROL XL) 25 MG 24 hr tablet Take 1 tablet (25 mg total) by mouth daily.  30 tablet  11  . nitroGLYCERIN (NITROSTAT) 0.4 MG SL tablet 1 sublingual every 5 minutes 3 times if chest pain  20 tablet  1  .  isosorbide mononitrate (IMDUR) 30 MG 24 hr tablet Take 1 tablet (30 mg total) by mouth daily.  30 tablet  11    No Known Allergies  Past Medical History  Diagnosis Date  . Mild hyperlipidemia   . Liver function test abnormality     normal when repeated  . Hyperprolactinemia     dx in her 19, took meds, self d/c a while back  . Pyoderma gangrenosa   . Palpitations     negative stress echo in November 2012 with normal LV function; mild LVH, proximal septal thickening with narrow LVOT and mild gradient; mild MR and TR; Cardionet showed PACs in November 2012  . HTN (hypertension)   . Obesity     Past Surgical History  Procedure Date  . Skin graft 2006    porcine, R leg    History  Smoking status  . Never Smoker   Smokeless tobacco  . Not on file    History  Alcohol Use  . Yes    wine    Family History  Problem Relation Age of Onset  . Cancer Neg Hx   . Coronary artery disease      F in his 32  . Diabetes      GP  . Heart disease Mother  endocarditis    Review of Systems: The review of systems is per the HPI.  All other systems were reviewed and are negative.  Physical Exam: BP 162/102  Pulse 67  Ht 5\' 4"  (1.626 m)  Wt 208 lb (94.348 kg)  BMI 35.70 kg/m2  SpO2 98% Patient is very pleasant and in no acute distress. She is overweight. Skin is warm and dry. Color is normal.  HEENT is unremarkable. Normocephalic/atraumatic. PERRL. Sclera are nonicteric. Neck is supple. No masses. No JVD. Lungs are clear. Cardiac exam shows a regular rate and rhythm. Abdomen is soft. Extremities are without edema. Gait and ROM are intact. No gross neurologic deficits noted.   LABORATORY DATA: EKG today shows sinus rhythm. She has no ST or T wave changes.   No recent lipids noted.  Lab Results  Component Value Date   WBC 6.1 09/23/2010   HGB 13.1 09/23/2010   HCT 39.0 09/23/2010   PLT 226.0 09/23/2010   GLUCOSE 88 09/23/2010   ALT 49* 09/23/2010   AST 29 09/23/2010   NA 139  09/23/2010   K 4.5 09/23/2010   CL 106 09/23/2010   CREATININE 0.7 09/23/2010   BUN 16 09/23/2010   CO2 26 09/23/2010   TSH 0.52 09/23/2010    Assessment / Plan:

## 2011-05-26 NOTE — Telephone Encounter (Signed)
Above reviewed Christine Jacobson

## 2011-05-26 NOTE — Telephone Encounter (Signed)
PT SAW Christine Jacobson TODAY AND NEEDS TO SCHEDULE HEART CATH WOULD LIKE 06-03-11 INSTEAD OF 5-10, PLS CALL

## 2011-05-26 NOTE — Assessment & Plan Note (Addendum)
Patient presents with at least 2 weeks of exertional chest pain associated with shortness of breath. She has no symptoms at rest. She has multiple CV risk factors. I have advised cardiac catheterization. Cardiac cath was discussed in detail including the risks and benefits.  I offered to have this procedure performed tomorrow.  Upon that recommendation she became quite anxious. Says she does not have time to schedule or time to go to the hospital. Despite repeated explanations that her symptoms do sound anginal in nature, she continues to refuse to schedule. She is agreeable to starting Imdur 30 mg. She is on aspirin. She says she will call Deliah Goody, RN for Dr. Jens Som once she decides but is not able to make that decision today. I will alert both Dr. Jens Som and Stanton Kidney. I have checked labs today which will include pre cath labs if she changes her mind later today. The triage room is alerted that she may call back to schedule.

## 2011-05-27 ENCOUNTER — Other Ambulatory Visit: Payer: Self-pay | Admitting: Nurse Practitioner

## 2011-05-27 DIAGNOSIS — Z01810 Encounter for preprocedural cardiovascular examination: Secondary | ICD-10-CM

## 2011-05-30 ENCOUNTER — Telehealth: Payer: Self-pay | Admitting: Cardiology

## 2011-05-30 ENCOUNTER — Encounter: Payer: Self-pay | Admitting: *Deleted

## 2011-05-30 NOTE — Telephone Encounter (Signed)
Spoke with pt, SHE HAS CANCELED HER CATH AND IS GOING TO [facility] FOR A 2ND OPTION.

## 2011-05-30 NOTE — Telephone Encounter (Signed)
Pt rtn call to debra from friday

## 2011-06-07 ENCOUNTER — Encounter (HOSPITAL_BASED_OUTPATIENT_CLINIC_OR_DEPARTMENT_OTHER): Payer: Self-pay

## 2011-06-07 ENCOUNTER — Inpatient Hospital Stay (HOSPITAL_BASED_OUTPATIENT_CLINIC_OR_DEPARTMENT_OTHER): Admit: 2011-06-07 | Payer: BC Managed Care – PPO | Admitting: Cardiology

## 2011-06-07 SURGERY — JV LEFT HEART CATHETERIZATION WITH CORONARY ANGIOGRAM
Anesthesia: Moderate Sedation

## 2011-06-21 DIAGNOSIS — I421 Obstructive hypertrophic cardiomyopathy: Secondary | ICD-10-CM

## 2011-06-21 HISTORY — DX: Obstructive hypertrophic cardiomyopathy: I42.1

## 2011-07-11 DIAGNOSIS — R0602 Shortness of breath: Secondary | ICD-10-CM

## 2011-07-11 HISTORY — DX: Shortness of breath: R06.02

## 2012-02-28 ENCOUNTER — Telehealth: Payer: Self-pay | Admitting: Internal Medicine

## 2012-02-28 NOTE — Telephone Encounter (Signed)
Noted  

## 2012-02-28 NOTE — Telephone Encounter (Signed)
Patient Information:  Caller Name: Nissi  Phone: [phone]  Patient: Christine Jacobson  Gender: Female  DOB: Apr 05, 1955  Age: 57 Years  PCP: Willow Ora  Office Follow Up:  Does the office need to follow up with this patient?: No  Instructions For The Office: N/A  RN Note:  Patient states that she does not have complete loss of hearing in affected ear.  Symptoms  Reason For Call & Symptoms: Left ear clogged with ear wax  Reviewed Health History In EMR: Yes  Reviewed Medications In EMR: Yes  Reviewed Allergies In EMR: Yes  Reviewed Surgeries / Procedures: Yes  Date of Onset of Symptoms: 02/27/2012  Treatments Tried: tried to unclog using  finger and qtip  Treatments Tried Worked: No  Guideline(s) Used:  Earwax  Disposition Per Guideline:   Home Care  Reason For Disposition Reached:   Earwax removal, questions about  Advice Given:  Ear Drops - Use For 4 Days To Soften And Remove Wax:  Ear drops can soften and break up earwax.  Get Debrox or Murine ear drops (OTC) from a pharmacy  Use 5 drops in affected ear, 2 times daily, for 4 days.  Caution - Ear Drops:   STOP using ear drops if pain occurs.  Ear Syringing - When Wax Remains After Using Ear Drops:   Use ear drops to soften the wax for 4 days first.  You can remove any remaining wax by gently squirting the ear with lukewarm water, using a soft rubber ear syringe.  Expected Course:   If this advice does not help you, then you should make an appointment with your doctor in the next 2 weeks.  Call Back If:   Earache occurs  You become worse.

## 2012-03-06 ENCOUNTER — Telehealth: Payer: Self-pay | Admitting: Internal Medicine

## 2012-03-06 ENCOUNTER — Encounter: Payer: Self-pay | Admitting: Family

## 2012-03-06 ENCOUNTER — Ambulatory Visit (INDEPENDENT_AMBULATORY_CARE_PROVIDER_SITE_OTHER): Payer: BC Managed Care – PPO | Admitting: Family

## 2012-03-06 VITALS — BP 152/100 | HR 66 | Temp 98.4°F | Wt 208.0 lb

## 2012-03-06 DIAGNOSIS — J309 Allergic rhinitis, unspecified: Secondary | ICD-10-CM

## 2012-03-06 DIAGNOSIS — H6120 Impacted cerumen, unspecified ear: Secondary | ICD-10-CM

## 2012-03-06 DIAGNOSIS — H9202 Otalgia, left ear: Secondary | ICD-10-CM

## 2012-03-06 DIAGNOSIS — H6122 Impacted cerumen, left ear: Secondary | ICD-10-CM

## 2012-03-06 DIAGNOSIS — H9209 Otalgia, unspecified ear: Secondary | ICD-10-CM

## 2012-03-06 NOTE — Telephone Encounter (Signed)
Patient was seen elsewhere today

## 2012-03-06 NOTE — Telephone Encounter (Signed)
Patient Information:  Caller Name: Zana  Phone: [phone]  Patient: Christine Jacobson, Christine Jacobson  Gender: Female  DOB: [birth year]  Age: 57 Years  PCP: Willow Ora  Office Follow Up:  Does the office need to follow up with this patient?: No  Instructions For The Office: N/A  RN Note:  Currently at work.  No appointments remain at [facility] office; appointment scheduled for 03/06/12 at 1600 with Lubertha Sayres, PA at [facility] office.   Symptoms  Reason For Call & Symptoms: L ear congestion with dizziness and mild disorientation "like have not slept enough with symptoms of a "head cold."  Reviewed Health History In EMR: Yes  Reviewed Medications In EMR: Yes  Reviewed Allergies In EMR: Yes  Reviewed Surgeries / Procedures: Yes  Date of Onset of Symptoms: 02/25/2012  Treatments Tried: Deborox  Treatments Tried Worked: No  Guideline(s) Used:  Hearing Loss  Ear - Congestion  Disposition Per Guideline:   See Today in Office  Reason For Disposition Reached:   Patient wants to be seen  Advice Given:  Reassurance:  Definition: Ear congestion is the medical term used to describe symptoms of a stuffy, full, or plugged sensation in the ear. People also sometimes describe crackling or popping noises in the ear. Sometimes hearing seems slightly muffled.  Eustacian tube: There is a small collapsible tube that runs between the middle ear and the nose. Normally, it permits tiny amounts of air to move in and out of the middle ear. When the tube gets blocked, air or fluid can build up behind the ear drum (tympanic membrane). This causes the symptoms of ear congestion.  Here is some care advice that should help.  Causes  Blowing the nose too hard can also push air and fluid into the eustachian tube.  Treatment - Chewing and Swallowing:   Try chewing gum.  You can also try swallowing water while pinching your nostrils closed. The reason this works is that it creates a small vacuum in the  nose. This helps the eustachian tube to open up.  Call Back If:   Ear congestion lasts over 48 hours  Ear pain or fever occurs  You become worse.

## 2012-03-06 NOTE — Progress Notes (Signed)
Subjective:    Patient ID: Christine Jacobson, female    DOB: 1955/12/25, 57 y.o.   MRN: [id number]  HPI 57 year old white female, nonsmoker, patient of Dr. positive in today with complaints of left ear pain x10 days, dizziness, sneezing, congestion. Hasn't taken over-the-counter Advil for pain relief and using the box in her left ear with no relief   Review of Systems  Constitutional: Negative.   HENT: Positive for ear pain.   Respiratory: Negative.   Cardiovascular: Negative.   Genitourinary: Negative.   Musculoskeletal: Negative.   Skin: Negative.   Allergic/Immunologic: Negative.   Neurological: Positive for dizziness. Negative for light-headedness and numbness.  Hematological: Negative.   Psychiatric/Behavioral: Negative.    Past Medical History  Diagnosis Date  . Mild hyperlipidemia   . Liver function test abnormality     normal when repeated  . Hyperprolactinemia     dx in her 39, took meds, self d/c a while back  . Pyoderma gangrenosa   . Palpitations     negative stress echo in November 2012 with normal LV function; mild LVH, proximal septal thickening with narrow LVOT and mild gradient; mild MR and TR; Cardionet showed PACs in November 2012  . HTN (hypertension)   . Obesity     History   Social History  . Marital Status: Single    Spouse Name: N/A    Number of Children: 0  . Years of Education: N/A   Occupational History  . para Psychologist, forensic, part time cook    Social History Main Topics  . Smoking status: Never Smoker   . Smokeless tobacco: Not on file  . Alcohol Use: Yes     Comment: wine  . Drug Use: No  . Sexually Active: Not Currently   Other Topics Concern  . Not on file   Social History Narrative   Lives by self    Past Surgical History  Procedure Laterality Date  . Skin graft  2006    porcine, R leg    Family History  Problem Relation Age of Onset  . Cancer Neg Hx   . Coronary artery disease      F in his 32  . Diabetes      GP   . Heart disease Mother     endocarditis    No Known Allergies  Current Outpatient Prescriptions on File Prior to Visit  Medication Sig Dispense Refill  . aspirin 81 MG tablet Take 81 mg by mouth daily.        . Ibuprofen 200 MG CAPS Take by mouth as needed.        . nitroGLYCERIN (NITROSTAT) 0.4 MG SL tablet 1 sublingual every 5 minutes 3 times if chest pain  20 tablet  1  . isosorbide mononitrate (IMDUR) 30 MG 24 hr tablet Take 1 tablet (30 mg total) by mouth daily.  30 tablet  11  . metoprolol succinate (TOPROL XL) 25 MG 24 hr tablet Take 1 tablet (25 mg total) by mouth daily.  30 tablet  11   No current facility-administered medications on file prior to visit.    BP 152/100  Pulse 66  Temp(Src) 98.4 F (36.9 C) (Oral)  Wt 208 lb (94.348 kg)  BMI 35.69 kg/m2  SpO2 98%chart    Objective:   Physical Exam  Constitutional: She is oriented to person, place, and time. She appears well-developed and well-nourished.  HENT:  Right Ear: External ear normal.  Nose: Nose normal.  Mouth/Throat: Oropharynx is  clear and moist.  Left ear cerumen impaction.  Neck: Normal range of motion. Neck supple.  Cardiovascular: Normal rate, regular rhythm and normal heart sounds.   Pulmonary/Chest: Effort normal and breath sounds normal.  Neurological: She is alert and oriented to person, place, and time.  Skin: Skin is warm and dry.  Psychiatric: She has a normal mood and affect.      Informed consent was obtained and peroxide gel was inserted into the ears bilaterally using the lavage kit the ears were lavaged until clean.Inspection with a cerumen spoon removed residual wax. Patient tolerated the procedure well.     Assessment & Plan:  Assessment: 1. Cerumen impaction 2. Otalgia 3. Rhinitis  Plan: Over-the-counter Claritin or Zyrtec once daily. Patient call the office if symptoms worsen or persist. Recheck a schedule, and as needed.

## 2012-03-06 NOTE — Patient Instructions (Addendum)
1. Claritin or Zyrtec once daily   Cerumen Impaction A cerumen impaction is when the wax in your ear forms a plug. This plug usually causes reduced hearing. Sometimes it also causes an earache or dizziness. Removing a cerumen impaction can be difficult and painful. The wax sticks to the ear canal. The canal is sensitive and bleeds easily. If you try to remove a heavy wax buildup with a cotton tipped swab, you may push it in further. Irrigation with water, suction, and small ear curettes may be used to clear out the wax. If the impaction is fixed to the skin in the ear canal, ear drops may be needed for a few days to loosen the wax. People who build up a lot of wax frequently can use ear wax removal products available in your local drugstore. SEEK MEDICAL CARE IF:  You develop an earache, increased hearing loss, or marked dizziness. Document Released: 02/11/2004 Document Revised: 03/28/2011 Document Reviewed: 04/02/2009 [facility] Patient Information 2013 [place], [place].

## 2012-06-08 ENCOUNTER — Ambulatory Visit: Payer: BC Managed Care – PPO | Admitting: Cardiovascular Disease

## 2012-06-13 ENCOUNTER — Encounter: Payer: Self-pay | Admitting: Cardiovascular Disease

## 2012-06-14 ENCOUNTER — Ambulatory Visit (INDEPENDENT_AMBULATORY_CARE_PROVIDER_SITE_OTHER): Payer: BC Managed Care – PPO | Admitting: Cardiovascular Disease

## 2012-06-14 ENCOUNTER — Encounter: Payer: Self-pay | Admitting: Cardiovascular Disease

## 2012-06-14 VITALS — BP 130/80 | HR 67 | Ht 64.0 in | Wt 196.0 lb

## 2012-06-14 DIAGNOSIS — R079 Chest pain, unspecified: Secondary | ICD-10-CM

## 2012-06-14 DIAGNOSIS — R011 Cardiac murmur, unspecified: Secondary | ICD-10-CM

## 2012-06-14 NOTE — Patient Instructions (Addendum)
Return in 6 months.    Continue same medications.

## 2012-06-17 ENCOUNTER — Encounter: Payer: Self-pay | Admitting: Cardiovascular Disease

## 2012-06-17 NOTE — Progress Notes (Signed)
Patient ID: Christine Jacobson, female   DOB: 1955/02/08, 57 y.o.   MRN: [id number]   Reason for office visit Followup for hypertension, hyperlipidemia, atypical chest pain  Overall the patient is feeling better. She continues consistent and successful efforts at losing weight. She remains a mildly obese range but is making progress. She now believes that many of her complaints are related to stress at work. She is having more more conflicts with her direct supervisor. These commonly bring on her symptoms. 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 She describes a feeling of dizziness and fullness at the top of her head if she bends over and gets up too quickly. Has not recently been troubled by chest pain and has only very mild ankle edema.  No Known Allergies  Current Outpatient Prescriptions  Medication Sig Dispense Refill  . aspirin 81 MG tablet Take 81 mg by mouth daily. Only taking occas      . Ibuprofen 200 MG CAPS Take by mouth as needed.        . metoprolol succinate (TOPROL-XL) 25 MG 24 hr tablet Take 25 mg by mouth 2 (two) times daily before a meal.      . nitroGLYCERIN (NITROSTAT) 0.4 MG SL tablet 1 sublingual every 5 minutes 3 times if chest pain  20 tablet  1  . hydrochlorothiazide (MICROZIDE) 12.5 MG capsule 12.5 mg daily.       . isosorbide mononitrate (IMDUR) 30 MG 24 hr tablet Take 1 tablet (30 mg total) by mouth daily.  30 tablet  11   No current facility-administered medications for this visit.    Past Medical History  Diagnosis Date  . Mild hyperlipidemia   . Liver function test abnormality     normal when repeated  . Hyperprolactinemia     dx in her 54, took meds, self d/c a while back  . Pyoderma gangrenosa   . Palpitations     negative stress echo in November 2012 with normal LV function; mild LVH, proximal septal thickening with narrow LVOT and mild gradient; mild MR and TR; Cardionet  showed PACs in November 2012  . HTN (hypertension)   . Obesity   . HOCM (hypertrophic obstructive cardiomyopathy) 06/21/2011    2D ECHO - EF >55%, normal  . Chest pain 11/22/2010    2D STRESS ECHO - EF 60%, peak stress EF 80%, normal, no evidence for stress-induced ischemia  . Shortness of breath 07/11/2011    MET TEST    Past Surgical History  Procedure Laterality Date  . Skin graft  2006    porcine, R leg    Family History  Problem Relation Age of Onset  . Cancer Neg Hx   . Coronary artery disease      F in his 2  . Diabetes      GP  . Heart disease Mother     endocarditis  . Ovarian cancer Mother   . Emphysema Father     History   Social History  . Marital Status: Single    Spouse Name: N/A    Number of Children: 0  . Years of Education: N/A   Occupational History  . para Psychologist, forensic, part time cook    Social History Main Topics  . Smoking status: Never Smoker   . Smokeless tobacco: Not on file  . Alcohol Use: 3.5 - 7 oz/week    7-14 drink(s)  per week     Comment: wine  . Drug Use: No  . Sexually Active: Not Currently   Other Topics Concern  . Not on file   Social History Narrative   Lives by self    Review of systems: The patient specifically denies any chest pain at rest or with exertion, dyspnea at rest or with exertion, orthopnea, paroxysmal nocturnal dyspnea, syncope, palpitations, focal neurological deficits, intermittent claudication, lower extremity edema, unexplained weight gain, cough, hemoptysis or wheezing.   PHYSICAL EXAM BP 130/80  Pulse 67  Ht 5\' 4"  (1.626 m)  Wt 196 lb (88.905 kg)  BMI 33.63 kg/m2  General: Alert, oriented x3, no distress, mildly obese  Head: no evidence of trauma, PERRL, EOMI, no exophtalmos or lid lag, no myxedema, no xanthelasma; normal ears, nose and oropharynx Neck: normal jugular venous pulsations and no hepatojugular reflux; brisk carotid pulses without delay and no carotid bruits Chest: clear to  auscultation, no signs of consolidation by percussion or palpation, normal fremitus, symmetrical and full respiratory excursions Cardiovascular: normal position and quality of the apical impulse, regular rhythm, normal first and second heart sounds, grade 2/6 early peaking systolic ejection murmur in the aortic focus, rubs or gallops Abdomen: no tenderness or distention, no masses by palpation, no abnormal pulsatility or arterial bruits, normal bowel sounds, no hepatosplenomegaly Extremities: no clubbing, cyanosis or edema; 2+ radial, ulnar and brachial pulses bilaterally; 2+ right femoral, posterior tibial and dorsalis pedis pulses; 2+ left femoral, posterior tibial and dorsalis pedis pulses; no subclavian or femoral bruits Neurological: grossly nonfocal   EKG: Normal sinus rhythm, voltage criteria for LV hypertrophy only  Lipid Panel     Component Value Date/Time   CHOL 229* 05/26/2011 0928   TRIG 59.0 05/26/2011 0928   HDL 100.90 05/26/2011 0928   CHOLHDL 2 05/26/2011 0928   VLDL 11.8 05/26/2011 0928    BMET    Component Value Date/Time   NA 140 05/26/2011 0928   K 4.0 05/26/2011 0928   CL 106 05/26/2011 0928   CO2 26 05/26/2011 0928   GLUCOSE 98 05/26/2011 0928   BUN 18 05/26/2011 0928   CREATININE 0.8 05/26/2011 0928   CALCIUM 9.6 05/26/2011 0928     ASSESSMENT AND PLAN  Her symptoms are satisfactorily controlled. Her blood pressures in the desirable range. She has mildly elevated total cholesterol but this is compensated by an excellent HDL cholesterol. Have congratulated her on her commitment to regular physical exercise and dietary restraint. This has paid off as she has lost over 16 pounds of weight in about 9 months. We discussed the fact that she is still in the obese category and has some way to go.  Note that she had a normal cardiopulmonary stress test in June of last year. The only abnormality was the inability to achieve 85% maximum predicted heart rate for age, this is due to intentional  beta blocker therapy. Low-dose beta blockade appears to have improved her symptoms. No changes to make her medications today. Her systolic murmur raised concern for possible hypertrophic cardiomyopathy with LV outflow tract obstruction but this was not found by echocardiography  Christine Jacobson  Thurmon Fair, MD, [facility] [phone] office [phone] pager

## 2012-07-12 ENCOUNTER — Other Ambulatory Visit: Payer: Self-pay | Admitting: Cardiovascular Disease

## 2012-07-12 NOTE — Telephone Encounter (Signed)
Rx was sent to pharmacy electronically. 

## 2012-11-23 ENCOUNTER — Ambulatory Visit (HOSPITAL_BASED_OUTPATIENT_CLINIC_OR_DEPARTMENT_OTHER)
Admission: RE | Admit: 2012-11-23 | Discharge: 2012-11-23 | Disposition: A | Discharge status: Home / Self Care | Payer: BC Managed Care – PPO | Source: Ambulatory Visit | Attending: Internal Medicine | Admitting: Internal Medicine

## 2012-11-23 ENCOUNTER — Ambulatory Visit (INDEPENDENT_AMBULATORY_CARE_PROVIDER_SITE_OTHER): Payer: BC Managed Care – PPO | Admitting: Internal Medicine

## 2012-11-23 ENCOUNTER — Encounter: Payer: Self-pay | Admitting: Internal Medicine

## 2012-11-23 VITALS — BP 164/79 | HR 77 | Temp 98.0°F | Wt 189.0 lb

## 2012-11-23 DIAGNOSIS — R509 Fever, unspecified: Secondary | ICD-10-CM | POA: Insufficient documentation

## 2012-11-23 DIAGNOSIS — J209 Acute bronchitis, unspecified: Secondary | ICD-10-CM

## 2012-11-23 DIAGNOSIS — R0989 Other specified symptoms and signs involving the circulatory and respiratory systems: Secondary | ICD-10-CM | POA: Insufficient documentation

## 2012-11-23 DIAGNOSIS — R05 Cough: Secondary | ICD-10-CM | POA: Insufficient documentation

## 2012-11-23 DIAGNOSIS — R042 Hemoptysis: Secondary | ICD-10-CM | POA: Insufficient documentation

## 2012-11-23 DIAGNOSIS — R059 Cough, unspecified: Secondary | ICD-10-CM | POA: Insufficient documentation

## 2012-11-23 DIAGNOSIS — J189 Pneumonia, unspecified organism: Secondary | ICD-10-CM

## 2012-11-23 MED ORDER — MOXIFLOXACIN HCL 400 MG PO TABS: 400.0000 mg | ORAL_TABLET | Freq: Every day | ORAL | Status: DC

## 2012-11-23 NOTE — Assessment & Plan Note (Addendum)
Patient presents with respiratory symptoms and hemoptysis, I am somehow concerned about hemoptysis but this is happening in the setting of an acute respiratory illness thus is unlikely to be malignant. BP slightly elevated today, likely because she is ill. Plan:Chest x-ray, [facility].Prompt return  if not better, see instructions  Addendum: X-ray did show pneumonia, patient called : [facility], followup in 3 weeks, must let me know or go to the ER if she's not improving in the next couple of days, will fax a work excuse to [phone]

## 2012-11-23 NOTE — Progress Notes (Signed)
  Subjective:    Patient ID: Christine Jacobson, female    DOB: 1955-08-06, 57 y.o.   MRN: [id number]  HPI Acute visit, CC "I have a bad cold" Symptoms started 4 days ago: Fever up to 100.8, cough with greenish sputum mixed with some blood ; states  the sputum is getting thinner. She also has developed a steady pain at the mid back on the right, not worse with deep breaths or cough.  Past Medical History  Diagnosis Date  . Mild hyperlipidemia   . Liver function test abnormality     normal when repeated  . Hyperprolactinemia     dx in her 30, took meds, self d/c a while back  . Pyoderma gangrenosa   . Palpitations     negative stress echo in November 2012 with normal LV function; mild LVH, proximal septal thickening with narrow LVOT and mild gradient; mild MR and TR; Cardionet showed PACs in November 2012  . HTN (hypertension)   . Obesity   . HOCM (hypertrophic obstructive cardiomyopathy) 06/21/2011    2D ECHO - EF >55%, normal  . Chest pain 11/22/2010    2D STRESS ECHO - EF 60%, peak stress EF 80%, normal, no evidence for stress-induced ischemia  . Shortness of breath 07/11/2011    MET TEST   Past Surgical History  Procedure Laterality Date  . Skin graft  2006    porcine, R leg    Review of Systems Denies sinus pain or congestion. No substernal chest pain, no difficulty breathing, mild wheezing?. Lower extremity edema is at baseline, no pain in the calves No recent prolonged plane or car trip. No foreign trips. Have nausea x1 yesterday, no vomiting diarrhea. No myalgias.     Objective:   Physical Exam BP 164/79  Pulse 77  Temp(Src) 98 F (36.7 C)  Wt 189 lb (85.73 kg)  SpO2 94% General -- alert, well-developed, Not toxic appearing. HEENT-- Not pale. TMs normal, throat symmetric, no redness or discharge. Face symmetric, sinuses not tender to palpation. Nose not congested.  Lungs -- normal respiratory effort, no intercostal retractions, no accessory muscle use, andfew rhonchi  at bilateral bases. No crepitus , egophony, no increased work of breathing. Heart-- + murmur. Regular   Extremities-- no pretibial edema bilaterally , calves symmetric, not tender  Neurologic--  alert & oriented X3. Speech normal, gait normal, strength normal in all extremities.  Psych-- Cognition and judgment appear intact. Cooperative with normal attention span and concentration. No anxious appearing , no depressed appearing.      Assessment & Plan:

## 2012-11-23 NOTE — Progress Notes (Signed)
Pre visit review using our clinic review tool, if applicable. No additional management support is needed unless otherwise documented below in the visit note. 

## 2012-11-23 NOTE — Patient Instructions (Signed)
Get the XR at [facility], corner of [address] (10 minutes form here); they are open 24/7 [address]  [place], [place] [address] [phone]   Rest, fluids , tylenol For cough, take Mucinex DM twice a day until better   Take the antibiotic as prescribed  (Avelox) Call if no better in few days Call anytime if the symptoms are severe (high fever, short of breath, chest pain). Also, come back to the office if you continue coughing up  Blood

## 2012-12-02 ENCOUNTER — Telehealth: Payer: Self-pay | Admitting: Internal Medicine

## 2012-12-02 NOTE — Telephone Encounter (Signed)
Please call patient, was diagnosed with pneumonia, is she feeling better? Any problems?

## 2012-12-04 NOTE — Telephone Encounter (Signed)
Pt scheduled 12/19/12 follow-up. DJR

## 2012-12-04 NOTE — Telephone Encounter (Signed)
Spoke with pt. Pt states is feeling better with the antibiotics. Pt wants to know if she could get a refill on the antibiotics or if possible a prescription that will prevent her from having another relapse.  Please advise.  DJR

## 2012-12-04 NOTE — Telephone Encounter (Addendum)
If she is improving and no fever, no need to have another round of antibiotics I do recommend a follow up for a checkup in 2 or 3 weeks, Will need a followup chest x-ray

## 2012-12-10 ENCOUNTER — Ambulatory Visit: Payer: BC Managed Care – PPO | Admitting: Internal Medicine

## 2012-12-12 ENCOUNTER — Ambulatory Visit: Payer: BC Managed Care – PPO | Admitting: Cardiovascular Disease

## 2012-12-19 ENCOUNTER — Ambulatory Visit: Payer: BC Managed Care – PPO | Admitting: Internal Medicine

## 2012-12-19 DIAGNOSIS — Z0289 Encounter for other administrative examinations: Secondary | ICD-10-CM

## 2013-01-06 ENCOUNTER — Telehealth: Payer: Self-pay | Admitting: Internal Medicine

## 2013-01-06 DIAGNOSIS — J189 Pneumonia, unspecified organism: Secondary | ICD-10-CM

## 2013-01-06 NOTE — Telephone Encounter (Signed)
Advise pt : due for repeated CXR, please arrange Dx pneumonia

## 2013-01-07 NOTE — Telephone Encounter (Signed)
Done. DJR  

## 2013-01-23 ENCOUNTER — Telehealth: Payer: Self-pay | Admitting: *Deleted

## 2013-01-23 DIAGNOSIS — J189 Pneumonia, unspecified organism: Secondary | ICD-10-CM

## 2013-01-23 NOTE — Telephone Encounter (Signed)
Spoke with patient and made aware that she needed a follow up CXR. Future orders already placed by Dr. Drue NovelPaz.

## 2013-01-29 ENCOUNTER — Encounter: Payer: Self-pay | Admitting: *Deleted

## 2013-01-29 ENCOUNTER — Ambulatory Visit (HOSPITAL_BASED_OUTPATIENT_CLINIC_OR_DEPARTMENT_OTHER)
Admission: RE | Admit: 2013-01-29 | Discharge: 2013-01-29 | Disposition: A | Discharge status: Home / Self Care | Payer: BC Managed Care – PPO | Source: Ambulatory Visit | Attending: Internal Medicine | Admitting: Internal Medicine

## 2013-01-29 DIAGNOSIS — J189 Pneumonia, unspecified organism: Secondary | ICD-10-CM

## 2013-02-27 ENCOUNTER — Ambulatory Visit: Payer: BC Managed Care – PPO | Admitting: Internal Medicine

## 2013-02-28 ENCOUNTER — Ambulatory Visit: Payer: BC Managed Care – PPO | Admitting: Cardiovascular Disease

## 2013-03-19 ENCOUNTER — Ambulatory Visit: Payer: BC Managed Care – PPO | Admitting: Cardiovascular Disease

## 2013-04-04 ENCOUNTER — Ambulatory Visit: Payer: BC Managed Care – PPO | Admitting: Cardiovascular Disease

## 2013-05-03 ENCOUNTER — Encounter: Payer: Self-pay | Admitting: Cardiovascular Disease

## 2013-05-03 ENCOUNTER — Ambulatory Visit (INDEPENDENT_AMBULATORY_CARE_PROVIDER_SITE_OTHER): Payer: BC Managed Care – PPO | Admitting: Cardiovascular Disease

## 2013-05-03 VITALS — BP 110/70 | HR 75 | Resp 16 | Ht 64.0 in | Wt 184.1 lb

## 2013-05-03 DIAGNOSIS — R079 Chest pain, unspecified: Secondary | ICD-10-CM

## 2013-05-03 MED ORDER — HYDROCHLOROTHIAZIDE 12.5 MG PO CAPS: 12.5000 mg | ORAL_CAPSULE | Freq: Every day | ORAL | Status: DC

## 2013-05-03 NOTE — Patient Instructions (Signed)
Decrease HCTZ to every other day if leg cramping persists.  Dr. Royann Shiversroitoru recommends that you schedule a follow-up appointment in: 6 months.

## 2013-05-04 ENCOUNTER — Emergency Department (HOSPITAL_COMMUNITY)
Admission: EM | Admit: 2013-05-04 | Discharge: 2013-05-05 | Disposition: A | Discharge status: Home / Self Care | Payer: BC Managed Care – PPO | Attending: Emergency Medicine | Admitting: Emergency Medicine

## 2013-05-04 ENCOUNTER — Encounter (HOSPITAL_COMMUNITY): Payer: Self-pay | Admitting: Emergency Medicine

## 2013-05-04 ENCOUNTER — Encounter: Payer: Self-pay | Admitting: Cardiovascular Disease

## 2013-05-04 DIAGNOSIS — E669 Obesity, unspecified: Secondary | ICD-10-CM | POA: Insufficient documentation

## 2013-05-04 DIAGNOSIS — I10 Essential (primary) hypertension: Secondary | ICD-10-CM | POA: Insufficient documentation

## 2013-05-04 DIAGNOSIS — Z872 Personal history of diseases of the skin and subcutaneous tissue: Secondary | ICD-10-CM | POA: Insufficient documentation

## 2013-05-04 DIAGNOSIS — E785 Hyperlipidemia, unspecified: Secondary | ICD-10-CM | POA: Insufficient documentation

## 2013-05-04 DIAGNOSIS — R55 Syncope and collapse: Secondary | ICD-10-CM | POA: Insufficient documentation

## 2013-05-04 DIAGNOSIS — F411 Generalized anxiety disorder: Secondary | ICD-10-CM | POA: Insufficient documentation

## 2013-05-04 DIAGNOSIS — Z79899 Other long term (current) drug therapy: Secondary | ICD-10-CM | POA: Insufficient documentation

## 2013-05-04 DIAGNOSIS — I517 Cardiomegaly: Secondary | ICD-10-CM | POA: Insufficient documentation

## 2013-05-04 DIAGNOSIS — Z7982 Long term (current) use of aspirin: Secondary | ICD-10-CM | POA: Insufficient documentation

## 2013-05-04 NOTE — Progress Notes (Signed)
Patient ID: Christine Jacobson, female   DOB: 1955/08/27, 58 y.o.   MRN: [id number]      Reason for office visit Hypertension, hyperlipidemia, murmur  Mrs. Dimaggio has made significant progress with weight loss. She is now in the mildly obese range and weighs almost 30 pounds less than when we first met. She continues to have a lot of emotional upheaval in her life but overall appears to be more optimistic and balance then in the past. She had issues with bad bronchitis earlier this year but this has resolved. She has had some problems with leg cramps at night, but wants to continue taking a small dose of diuretic to avoid ankle edema  She has a very loud dynamic systolic murmur on examination but on echocardiography she has only very mild outflow tract obstruction. She had a normal stress echo in 2012. She underwent a cardiopulmonary stress test in 2013 that was normal, with the exception of beta blocker related to chronotropic incompetence. She has not tolerated more than a small dose of beta blocker due to fatigue. She has never experienced syncope. She has occasional chest pain that is associated with emotional distress rather than with physical activity. She has mild exertional dyspnea that is improving as her exercise regimen continues.   No Known Allergies  Current Outpatient Prescriptions  Medication Sig Dispense Refill  . aspirin 81 MG tablet Take 81 mg by mouth daily. Only taking occas      . hydrochlorothiazide (MICROZIDE) 12.5 MG capsule Take 1 capsule (12.5 mg total) by mouth daily.  90 capsule  3  . Ibuprofen 200 MG CAPS Take by mouth as needed.        . metoprolol succinate (TOPROL-XL) 25 MG 24 hr tablet Take 1 tablet (25 mg total) by mouth 2 (two) times daily.  60 tablet  11  . nitroGLYCERIN (NITROSTAT) 0.4 MG SL tablet 1 sublingual every 5 minutes 3 times if chest pain  20 tablet  1   No current facility-administered medications for this visit.    Past Medical History  Diagnosis  Date  . Mild hyperlipidemia   . Liver function test abnormality     normal when repeated  . Hyperprolactinemia     dx in her 54, took meds, self d/c a while back  . Pyoderma gangrenosa   . Palpitations     negative stress echo in November 2012 with normal LV function; mild LVH, proximal septal thickening with narrow LVOT and mild gradient; mild MR and TR; Cardionet showed PACs in November 2012  . HTN (hypertension)   . Obesity   . HOCM (hypertrophic obstructive cardiomyopathy) 06/21/2011    2D ECHO - EF >55%, normal  . Chest pain 11/22/2010    2D STRESS ECHO - EF 60%, peak stress EF 80%, normal, no evidence for stress-induced ischemia  . Shortness of breath 07/11/2011    MET TEST    Past Surgical History  Procedure Laterality Date  . Skin graft  2006    porcine, R leg    Family History  Problem Relation Age of Onset  . Cancer Neg Hx   . Coronary artery disease      F in his 89  . Diabetes      GP  . Heart disease Mother     endocarditis  . Ovarian cancer Mother   . Emphysema Father     History   Social History  . Marital Status: Single    Spouse Name: N/A  Number of Children: 0  . Years of Education: N/A   Occupational History  . para Statistician, part time cook    Social History Main Topics  . Smoking status: Never Smoker   . Smokeless tobacco: Never Used  . Alcohol Use: 3.5 - 7.0 oz/week    7-14 drink(s) per week     Comment: wine  . Drug Use: No  . Sexual Activity: Not Currently   Other Topics Concern  . Not on file   Social History Narrative   Lives by self    Review of systems: The patient specifically denies any chest pain at rest or with exertion, but occasionally feels it with emotional distress. She has mild dyspnea with exertion, has not had dyspnea at rest, orthopnea, paroxysmal nocturnal dyspnea, syncope, palpitations, focal neurological deficits, intermittent claudication, lower extremity edema, unexplained weight gain, cough,  hemoptysis or wheezing.  The patient also denies abdominal pain, nausea, vomiting, dysphagia, diarrhea, constipation, polyuria, polydipsia, dysuria, hematuria, frequency, urgency, abnormal bleeding or bruising, fever, chills, unexpected weight changes, mood swings, change in skin or hair texture, change in voice quality, auditory or visual problems, allergic reactions or rashes, new musculoskeletal complaints other than usual "aches and pains".   PHYSICAL EXAM BP 110/70  Pulse 75  Resp 16  Ht _0  (1.626 m)  Wt 184 lb 1.6 oz (83.507 kg)  BMI 31.59 kg/m2 General: Alert, oriented x3, no distress, mildly obese  Head: no evidence of trauma, PERRL, EOMI, no exophtalmos or lid lag, no myxedema, no xanthelasma; normal ears, nose and oropharynx  Neck: normal jugular venous pulsations and no hepatojugular reflux; brisk carotid pulses without delay and no carotid bruits  Chest: clear to auscultation, no signs of consolidation by percussion or palpation, normal fremitus, symmetrical and full respiratory excursions  Cardiovascular: normal position and quality of the apical impulse, regular rhythm, normal first and second heart sounds, grade 2/6 early peaking systolic ejection murmur in the aortic focus that increases with Valsalva maneuver, rubs or gallops  Abdomen: no tenderness or distention, no masses by palpation, no abnormal pulsatility or arterial bruits, normal bowel sounds, no hepatosplenomegaly  Extremities: no clubbing, cyanosis or edema; 2+ radial, ulnar and brachial pulses bilaterally; 2+ right femoral, posterior tibial and dorsalis pedis pulses; 2+ left femoral, posterior tibial and dorsalis pedis pulses; no subclavian or femoral bruits  Neurological: grossly nonfocal   EKG: Normal sinus rhythm, left atrial abnormality, minor IVCD, no change from previous tracings  Lipid Panel     Component Value Date/Time   CHOL 229* 05/26/2011 0928   TRIG 59.0 05/26/2011 0928   HDL 100.90 05/26/2011 0928     CHOLHDL 2 05/26/2011 0928   VLDL 11.8 05/26/2011 0928    BMET    Component Value Date/Time   NA 140 05/26/2011 0928   K 4.0 05/26/2011 0928   CL 106 05/26/2011 0928   CO2 26 05/26/2011 0928   GLUCOSE 98 05/26/2011 0928   BUN 18 05/26/2011 0928   CREATININE 0.8 05/26/2011 0928   CALCIUM 9.6 05/26/2011 0928     ASSESSMENT AND PLAN   Mrs. Hobbs appears to have a forme fruste of hypertrophic obstructive cardiomyopathy, with benign clinical course and the absence of any high-risk features. No changes are made her current medical therapy. She is congratulated on her successful weight loss and encouraged to keep up the good work. She does not think that she will feel well if she decreases her weight to less than 175-180 pounds. \ Patient  Instructions  Decrease HCTZ to every other day if leg cramping persists.  Dr. Sallyanne Kuster recommends that you schedule a follow-up appointment in: 6 months.     Orders Placed This Encounter  Procedures  . EKG 12-Lead   Meds ordered this encounter  Medications  . hydrochlorothiazide (MICROZIDE) 12.5 MG capsule    Sig: Take 1 capsule (12.5 mg total) by mouth daily.    Dispense:  90 capsule    Refill:  3    Jahi Roza  Sanda Klein, MD, [facility] [phone] office [phone] pager

## 2013-05-04 NOTE — ED Notes (Signed)
Per EMS pt car broke down, [employer] was talking to patient and pt has 5 second syncopal episode- caught by [employer]. No injury. Pt has hx of anxiety and similar syncopal episode earlier this week. Pt denies SOB, CP. Pt lungs clear and equal. Pt. Alert and oriented x4.

## 2013-05-04 NOTE — ED Provider Notes (Signed)
CSN: [id number]     Arrival date & time 05/04/13  2309 History   First MD Initiated Contact with Patient 05/04/13 2320     Chief Complaint  Patient presents with  . Loss of Consciousness     (Consider location/radiation/quality/duration/timing/severity/associated sxs/prior Treatment) HPI Patient is a 58 yo woman with a history of HTN who presents after an episode of syncope. The patient says she has full recall of the event. She says she began to feel lightheaded and a little dizzy followed by shivering and then a feeling that her face was flushed. She briefly lost conciousness and slumped to the ground.   This episode was preceded by the patient's car breaking down on her way home from a 12 hr shift. From her car, she had walked across the street to a BP station and called 911. [employer] responded and the officer was going to let him use his cell phone to try to reach road side assistance. The patient says she became overhwhelmed by anxiety and feels that this is what caused her syncopal event.   Patient says she has been under a lot of stress. She had a similar episode last week which caused her to collapse to her knees.   Patient denies chest pain, SOB, headache. No new medications.   Past Medical History  Diagnosis Date  . Mild hyperlipidemia   . Liver function test abnormality     normal when repeated  . Hyperprolactinemia     dx in her 33, took meds, self d/c a while back  . Pyoderma gangrenosa   . Palpitations     negative stress echo in November 2012 with normal LV function; mild LVH, proximal septal thickening with narrow LVOT and mild gradient; mild MR and TR; Cardionet showed PACs in November 2012  . HTN (hypertension)   . Obesity   . HOCM (hypertrophic obstructive cardiomyopathy) 06/21/2011    2D ECHO - EF >55%, normal  . Chest pain 11/22/2010    2D STRESS ECHO - EF 60%, peak stress EF 80%, normal, no evidence for stress-induced ischemia  . Shortness of breath 07/11/2011    MET  TEST   Past Surgical History  Procedure Laterality Date  . Skin graft  2006    porcine, R leg   Family History  Problem Relation Age of Onset  . Cancer Neg Hx   . Coronary artery disease      F in his 64  . Diabetes      GP  . Heart disease Mother     endocarditis  . Ovarian cancer Mother   . Emphysema Father    History  Substance Use Topics  . Smoking status: Never Smoker   . Smokeless tobacco: Never Used  . Alcohol Use: 3.5 - 7.0 oz/week    7-14 drink(s) per week     Comment: wine   OB History   Grav Para Term Preterm Abortions TAB SAB Ect Mult Living                 Review of Systems Ten point review of symptoms performed and is negative with the exception of symptoms noted above.      Allergies  Review of patient's allergies indicates no known allergies.  Home Medications   Prior to Admission medications   Medication Sig Start Date End Date Taking? Authorizing Provider  aspirin 81 MG tablet Take 81 mg by mouth daily. Only taking occas    Historical Provider, MD  hydrochlorothiazide (  MICROZIDE) 12.5 MG capsule Take 1 capsule (12.5 mg total) by mouth daily. 05/03/13   [name] [name], MD  Ibuprofen 200 MG CAPS Take by mouth as needed.      Historical Provider, MD  metoprolol succinate (TOPROL-XL) 25 MG 24 hr tablet Take 1 tablet (25 mg total) by mouth 2 (two) times daily. 07/12/12   [name] [name], MD  nitroGLYCERIN (NITROSTAT) 0.4 MG SL tablet 1 sublingual every 5 minutes 3 times if chest pain 09/23/10   Colon Branch, MD   BP 147/68  Temp(Src) 97.8 F (36.6 C) (Oral)  Resp 16  Ht 5' 4" (1.626 m)  Wt 178 lb (80.74 kg)  BMI 30.54 kg/m2  SpO2 97% Physical Exam Gen: well developed and well nourished appearing Head: NCAT Eyes: PERL, EOMI Nose: no epistaixis or rhinorrhea Mouth/throat: mucosa is moist and pink Neck: supple, no stridor Lungs: CTA B, no wheezing, rhonchi or rales CV: regular rate and rythm, good distal pulses.  Abd: soft, notender,  nondistended Back: no ttp, no cva ttp Skin: warm and dry Ext: no edema, normal to inspection Neuro: CN ii-xii grossly intact, no focal deficits, normal speech, 5/5 motor strength both arms and legs, normal finger to nose Psyche; anxious affect,  calm and cooperative.  ED Course  Procedures (including critical care time)  Results for orders placed during the hospital encounter of 05/04/13 (from the past 24 hour(s))  CBC WITH DIFFERENTIAL     Status: None   Collection Time    05/04/13 11:59 PM      Result Value Ref Range   WBC 8.4  4.0 - 10.5 K/uL   RBC 4.04  3.87 - 5.11 MIL/uL   Hemoglobin 12.6  12.0 - 15.0 g/dL   HCT 37.3  36.0 - 46.0 %   MCV 92.3  78.0 - 100.0 fL   MCH 31.2  26.0 - 34.0 pg   MCHC 33.8  30.0 - 36.0 g/dL   RDW 13.7  11.5 - 15.5 %   Platelets 171  150 - 400 K/uL   Neutrophils Relative % 62  43 - 77 %   Neutro Abs 5.2  1.7 - 7.7 K/uL   Lymphocytes Relative 30  12 - 46 %   Lymphs Abs 2.5  0.7 - 4.0 K/uL   Monocytes Relative 6  3 - 12 %   Monocytes Absolute 0.5  0.1 - 1.0 K/uL   Eosinophils Relative 2  0 - 5 %   Eosinophils Absolute 0.1  0.0 - 0.7 K/uL   Basophils Relative 0  0 - 1 %   Basophils Absolute 0.0  0.0 - 0.1 K/uL  BASIC METABOLIC PANEL     Status: None   Collection Time    05/04/13 11:59 PM      Result Value Ref Range   Sodium 140  137 - 147 mEq/L   Potassium 3.7  3.7 - 5.3 mEq/L   Chloride 103  96 - 112 mEq/L   CO2 24  19 - 32 mEq/L   Glucose, Bld 85  70 - 99 mg/dL   BUN 23  6 - 23 mg/dL   Creatinine, Ser 0.73  0.50 - 1.10 mg/dL   Calcium 9.4  8.4 - 10.5 mg/dL   GFR calc non Af Amer >90  >90 mL/min   GFR calc Af Amer >90  >90 mL/min  I-STAT TROPOININ, ED     Status: None   Collection Time    05/05/13 12:31 AM  Result Value Ref Range   Troponin i, poc 0.01  0.00 - 0.08 ng/mL   Comment 3               EKG: nsr, no acute ischemic changes, normal intervals, normal axis, normal qrs complex, no acute ischemic changes  MDM   DDX:  vasovagal episode, cardiogenic syncope, anemia, volume depletion.   0117: patient is feeling better and says she is anxious to go home.   Case discussed with Cardiology fellow who has evaluated the patient, reviewed her most recent TTE and does not feel any further urgent or inpatient work up is indicated. The patient will f/u with her cardiologist to discuss use of a Holter monitor. Counseled re: return sx.   Elyn Peers, MD 05/05/13 [phone]

## 2013-05-05 DIAGNOSIS — I517 Cardiomegaly: Secondary | ICD-10-CM

## 2013-05-05 DIAGNOSIS — I10 Essential (primary) hypertension: Secondary | ICD-10-CM

## 2013-05-05 DIAGNOSIS — R55 Syncope and collapse: Secondary | ICD-10-CM

## 2013-05-05 LAB — BASIC METABOLIC PANEL
BUN: 23 mg/dL (ref 6–23)
CALCIUM: 9.4 mg/dL (ref 8.4–10.5)
CO2: 24 meq/L (ref 19–32)
CREATININE: 0.73 mg/dL (ref 0.50–1.10)
Chloride: 103 mEq/L (ref 96–112)
GFR calc non Af Amer: >90 mL/min (ref >90)
Glucose, Bld: 85 mg/dL (ref 70–99)
Potassium: 3.7 mEq/L (ref 3.7–5.3)
SODIUM: 140 meq/L (ref 137–147)

## 2013-05-05 LAB — CBC WITH DIFFERENTIAL/PLATELET
BASOS ABS: 0 10*3/uL (ref 0.0–0.1)
BASOS PCT: 0 % (ref 0–1)
Eosinophils Absolute: 0.1 10*3/uL (ref 0.0–0.7)
Eosinophils Relative: 2 % (ref 0–5)
HEMATOCRIT: 37.3 % (ref 36.0–46.0)
Hemoglobin: 12.6 g/dL (ref 12.0–15.0)
LYMPHS PCT: 30 % (ref 12–46)
Lymphs Abs: 2.5 10*3/uL (ref 0.7–4.0)
MCH: 31.2 pg (ref 26.0–34.0)
MCHC: 33.8 g/dL (ref 30.0–36.0)
MCV: 92.3 fL (ref 78.0–100.0)
MONO ABS: 0.5 10*3/uL (ref 0.1–1.0)
Monocytes Relative: 6 % (ref 3–12)
Neutro Abs: 5.2 10*3/uL (ref 1.7–7.7)
Neutrophils Relative %: 62 % (ref 43–77)
PLATELETS: 171 10*3/uL (ref 150–400)
RBC: 4.04 MIL/uL (ref 3.87–5.11)
RDW: 13.7 % (ref 11.5–15.5)
WBC: 8.4 10*3/uL (ref 4.0–10.5)

## 2013-05-05 LAB — I-STAT TROPONIN, ED: Troponin i, poc: 0.01 ng/mL (ref 0.00–0.08)

## 2013-05-05 NOTE — Consult Note (Signed)
Reason for Consult: Syncope Referring Physician: Jayni Prescher is an 58 y.o. female.  HPI: Ms Onofre is a 58 year old female with a history of dyslipidemia, PVCs, hypertension and mild LVH. She apparently has a mild HCM without a significant gradient. Ms Lannan was coming home from work this evening when her car broke down. She was overwhelmed emotionally. She felt flushed in her face and briefly lost consciousness. Ms Mcnicholas says that this has happened to her in the past during emotional or extremely stressful times. She denies any similar events occuring with exertion. She denies any chest pain or palpitations at this time. She has been in the ED for several hours, but hasn't had any abnormalities on her telemetry.   Past Medical History  Diagnosis Date  . Mild hyperlipidemia   . Liver function test abnormality     normal when repeated  . Hyperprolactinemia     dx in her 72, took meds, self d/c a while back  . Pyoderma gangrenosa   . Palpitations     negative stress echo in November 2012 with normal LV function; mild LVH, proximal septal thickening with narrow LVOT and mild gradient; mild MR and TR; Cardionet showed PACs in November 2012  . HTN (hypertension)   . Obesity   . HOCM (hypertrophic obstructive cardiomyopathy) 06/21/2011    2D ECHO - EF >55%, normal  . Chest pain 11/22/2010    2D STRESS ECHO - EF 60%, peak stress EF 80%, normal, no evidence for stress-induced ischemia  . Shortness of breath 07/11/2011    MET TEST    Past Surgical History  Procedure Laterality Date  . Skin graft  2006    porcine, R leg    Family History  Problem Relation Age of Onset  . Cancer Neg Hx   . Coronary artery disease      F in his 36  . Diabetes      GP  . Heart disease Mother     endocarditis  . Ovarian cancer Mother   . Emphysema Father     Social History:  reports that she has never smoked. She has never used smokeless tobacco. She reports that she drinks about 3.5 - 7 ounces  of alcohol per week. She reports that she does not use illicit drugs.  Allergies: No Known Allergies  Medications: I have reviewed the patient's current medications.  Results for orders placed during the hospital encounter of 05/04/13 (from the past 48 hour(s))  CBC WITH DIFFERENTIAL     Status: None   Collection Time    05/04/13 11:59 PM      Result Value Ref Range   WBC 8.4  4.0 - 10.5 K/uL   RBC 4.04  3.87 - 5.11 MIL/uL   Hemoglobin 12.6  12.0 - 15.0 g/dL   HCT 37.3  36.0 - 46.0 %   MCV 92.3  78.0 - 100.0 fL   MCH 31.2  26.0 - 34.0 pg   MCHC 33.8  30.0 - 36.0 g/dL   RDW 13.7  11.5 - 15.5 %   Platelets 171  150 - 400 K/uL   Neutrophils Relative % 62  43 - 77 %   Neutro Abs 5.2  1.7 - 7.7 K/uL   Lymphocytes Relative 30  12 - 46 %   Lymphs Abs 2.5  0.7 - 4.0 K/uL   Monocytes Relative 6  3 - 12 %   Monocytes Absolute 0.5  0.1 - 1.0 K/uL  Eosinophils Relative 2  0 - 5 %   Eosinophils Absolute 0.1  0.0 - 0.7 K/uL   Basophils Relative 0  0 - 1 %   Basophils Absolute 0.0  0.0 - 0.1 K/uL  BASIC METABOLIC PANEL     Status: None   Collection Time    05/04/13 11:59 PM      Result Value Ref Range   Sodium 140  137 - 147 mEq/L   Potassium 3.7  3.7 - 5.3 mEq/L   Chloride 103  96 - 112 mEq/L   CO2 24  19 - 32 mEq/L   Glucose, Bld 85  70 - 99 mg/dL   BUN 23  6 - 23 mg/dL   Creatinine, Ser 0.73  0.50 - 1.10 mg/dL   Calcium 9.4  8.4 - 10.5 mg/dL   GFR calc non Af Amer >90  >90 mL/min   GFR calc Af Amer >90  >90 mL/min   Comment: (NOTE)     The eGFR has been calculated using the CKD EPI equation.     This calculation has not been validated in all clinical situations.     eGFR's persistently <90 mL/min signify possible Chronic Kidney     Disease.  Randolm Idol, ED     Status: None   Collection Time    05/05/13 12:31 AM      Result Value Ref Range   Troponin i, poc 0.01  0.00 - 0.08 ng/mL   Comment 3            Comment: Due to the release kinetics of cTnI,     a negative  result within the first hours     of the onset of symptoms does not rule out     myocardial infarction with certainty.     If myocardial infarction is still suspected,     repeat the test at appropriate intervals.    No results found.  Review of Systems  All other systems reviewed and are negative.  Blood pressure 132/67, pulse 67, temperature 97.8 F (36.6 C), temperature source Oral, resp. rate 15, height _0  (1.626 m), weight 178 lb (80.74 kg), SpO2 96.00%. Physical Exam  Nursing note and vitals reviewed. Constitutional: She is oriented to person, place, and time. She appears well-developed and well-nourished. No distress.  Neck: No JVD present.  Cardiovascular: Normal rate, regular rhythm, intact distal pulses and normal pulses.   No extrasystoles are present.  Murmur heard.  Systolic murmur is present with a grade of 2/6  Respiratory: Effort normal and breath sounds normal.  GI: Soft. Bowel sounds are normal.  Musculoskeletal: She exhibits no edema.  Neurological: She is alert and oriented to person, place, and time.  Skin: Skin is warm and dry. She is not diaphoretic.   ecg shows normal sinus rhythm   Assessment/Plan: Ms Elms is a pleasant 58 year old female with history of hypertension, dyslipidemia and LVH who presents with an episode of syncope that occurred during a stressful period. She doesn't seem to have any high-risk abnormalities on her ECG at this time. Her telemetry does not show any arrhythmias. Her labs to date are normal.  She would like to go home this morning. Given her LV dimensions I think it is unlikely that she had syncope related to outlet obstruction. I do not see any reason for further observation as an inpatient.       Javier Docker MD 05/05/2013, 2:02 AM

## 2013-05-05 NOTE — ED Notes (Signed)
MD at bedside. 

## 2013-05-10 ENCOUNTER — Telehealth: Payer: Self-pay | Admitting: *Deleted

## 2013-05-10 DIAGNOSIS — R55 Syncope and collapse: Secondary | ICD-10-CM

## 2013-05-10 NOTE — Telephone Encounter (Signed)
Pt was calling in regards to her ER visit last week, pt is not feeling well. She fainted and black out after her car broke down in the middle of the street. She stated she wanted to speak to someone about getting something to calm down. She wants to speak to Dr. Salena Saner himself or Britta MccreedyBarbara  [facility]

## 2013-05-10 NOTE — Telephone Encounter (Signed)
Please schedule for a repeat echo for HOCM and syncope - do Valsalva maneuver for LVOT obstruction. I don't think an anxiolytic is the right thing for this.

## 2013-05-10 NOTE — Telephone Encounter (Signed)
Saturday PM broke down in the middle of a busy street, had to call [employer] for assistance.  So emotionally overwhelmed by this experience she became hot and flushed and briefly lost consciousness.  [facility] and [facility].  ER doctor thought it could have been caused by her outflow obstruction and/or increased stress.  Patient wants to know if she should take an anti-anxiety med or something else to prevent this from happening again.  Will review with Dr. Salena Saner when he comes in this afternoon and call her with advise.  Patient voiced understanding.

## 2013-05-10 NOTE — Telephone Encounter (Signed)
Tried to call patient with Dr. Renaye Rakers's suggestion.  He does not want to start on an anti-anxiety med.  Would like a repeat echo.  Patients phone (mailbox) is full.  Left phone # to return call.

## 2013-05-13 ENCOUNTER — Telehealth (HOSPITAL_COMMUNITY): Payer: Self-pay | Admitting: *Deleted

## 2013-05-13 NOTE — Telephone Encounter (Signed)
PATIENT NOTIFIED DR. C DOES NOT WANT TO START HER ON ANY ANTI-ANXIETY MEDS.  FEELS EPISODE WAS STRESS INDUCED.  VOICED UNDERSTANDING AND WILL CONTINUE CURRENT MEDICATIONS.

## 2013-06-04 ENCOUNTER — Telehealth (HOSPITAL_COMMUNITY): Payer: Self-pay | Admitting: *Deleted

## 2013-08-08 ENCOUNTER — Other Ambulatory Visit (HOSPITAL_COMMUNITY): Payer: Self-pay | Admitting: Cardiovascular Disease

## 2013-08-08 DIAGNOSIS — R55 Syncope and collapse: Secondary | ICD-10-CM

## 2013-08-14 ENCOUNTER — Other Ambulatory Visit: Payer: Self-pay | Admitting: Cardiovascular Disease

## 2013-08-15 NOTE — Telephone Encounter (Signed)
Rx was sent to pharmacy electronically. 

## 2013-09-04 ENCOUNTER — Ambulatory Visit (HOSPITAL_COMMUNITY): Payer: BC Managed Care – PPO

## 2013-09-11 ENCOUNTER — Ambulatory Visit: Payer: BC Managed Care – PPO | Admitting: Cardiovascular Disease

## 2013-10-03 ENCOUNTER — Encounter: Payer: Self-pay | Admitting: Cardiovascular Disease

## 2013-10-03 ENCOUNTER — Telehealth: Payer: Self-pay | Admitting: Cardiovascular Disease

## 2013-10-04 NOTE — Telephone Encounter (Signed)
Closed encounter °

## 2013-10-18 ENCOUNTER — Inpatient Hospital Stay (HOSPITAL_COMMUNITY): Admission: RE | Admit: 2013-10-18 | Payer: BC Managed Care – PPO | Source: Ambulatory Visit

## 2013-10-23 ENCOUNTER — Ambulatory Visit: Payer: BC Managed Care – PPO | Admitting: Cardiovascular Disease

## 2013-10-25 ENCOUNTER — Ambulatory Visit: Payer: BC Managed Care – PPO | Admitting: Cardiovascular Disease

## 2013-12-05 ENCOUNTER — Encounter: Payer: Self-pay | Admitting: Cardiovascular Disease

## 2013-12-05 ENCOUNTER — Ambulatory Visit (INDEPENDENT_AMBULATORY_CARE_PROVIDER_SITE_OTHER): Payer: BC Managed Care – PPO | Admitting: Cardiovascular Disease

## 2013-12-05 VITALS — BP 141/83 | HR 60 | Resp 16 | Ht 64.0 in | Wt 193.7 lb

## 2013-12-05 DIAGNOSIS — R011 Cardiac murmur, unspecified: Secondary | ICD-10-CM

## 2013-12-05 DIAGNOSIS — R0789 Other chest pain: Secondary | ICD-10-CM

## 2013-12-05 DIAGNOSIS — R55 Syncope and collapse: Secondary | ICD-10-CM

## 2013-12-05 DIAGNOSIS — R072 Precordial pain: Secondary | ICD-10-CM

## 2013-12-05 DIAGNOSIS — Z87898 Personal history of other specified conditions: Secondary | ICD-10-CM | POA: Insufficient documentation

## 2013-12-05 DIAGNOSIS — I10 Essential (primary) hypertension: Secondary | ICD-10-CM

## 2013-12-05 DIAGNOSIS — F419 Anxiety disorder, unspecified: Secondary | ICD-10-CM

## 2013-12-05 DIAGNOSIS — R002 Palpitations: Secondary | ICD-10-CM

## 2013-12-05 MED ORDER — HYDROCHLOROTHIAZIDE 12.5 MG PO CAPS: 12.5000 mg | ORAL_CAPSULE | ORAL | Status: DC | PRN

## 2013-12-05 MED ORDER — FUROSEMIDE 20 MG PO TABS: 20.0000 mg | ORAL_TABLET | Freq: Every day | ORAL | Status: DC

## 2013-12-05 NOTE — Progress Notes (Signed)
Patient ID: Christine Jacobson, female   DOB: 13-Dec-1955, 58 y.o.   MRN: [id number]      Reason for office visit Chest pain, syncope  Christine Jacobson is suffering from a lot of problems with anxiety. 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 She is also having serious financial problems. All these constantly weighing upon her. She really thinks she needs professional counseling and has been unable to "unwind" using conventional methods. She does not want to take anxiolytics.  The day after her last office appointment she had a syncopal event when talking to a policeman in the middle of the road, when her car broke down. The prodrome was highly suggestive of vasovagal syncope and she recalls passing out during stressful situations in the past. She has never had an heralded syncope or syncope associated with injury. No syncope since last May.  She has episodes of chest discomfort radiating into her left arm that occur at rest, not with exertion. She has also developed edema of her lower extremities and has exertional shortness of breath, functional class II (when asking a little more detail about her exercise tolerance, this hasn't really changed). She has been poorly compliant with sodium restriction. She "craves salt". She has gained weight and feels that her anxiety is making her eat more than she should.  As on previous evaluations she has a very distinct systolic murmur that is abolished by hand grip and intensifies after the Valsalva maneuver. Echo evaluation has shown at most a minimal increase in velocities across the left ventricular outflow tract. She does not have the typical echo phenotype of true obstructive cardiomyopathy.  She has previously had a stress echocardiogram and a cardiopulmonary stress test, neither one of which showed findings suggestive of hypertrophic obstructive cardiomyopathy or mitral valve prolapse or  any regional wall motion abnormalities or other signs of CAD. She has normal left ventricular ejection fraction. She has never had anatomical assessment for coronary disease.   No Known Allergies  Current Outpatient Prescriptions  Medication Sig Dispense Refill  . aspirin 81 MG tablet Take 81 mg by mouth daily as needed (takes when out of Toprol). Only taking occas    . Ibuprofen 200 MG CAPS Take 200-800 mg by mouth every 6 (six) hours as needed (headache or pain).     . metoprolol succinate (TOPROL-XL) 25 MG 24 hr tablet TAKE 1 TABLET (25 MG TOTAL) BY MOUTH 2 (TWO) TIMES DAILY. 60 tablet 9  . nitroGLYCERIN (NITROSTAT) 0.4 MG SL tablet 1 sublingual every 5 minutes 3 times if chest pain 20 tablet 1  . furosemide (LASIX) 20 MG tablet Take 1 tablet (20 mg total) by mouth daily. 30 tablet 6   No current facility-administered medications for this visit.    Past Medical History  Diagnosis Date  . Mild hyperlipidemia   . Liver function test abnormality     normal when repeated  . Hyperprolactinemia     dx in her 43, took meds, self d/c a while back  . Pyoderma gangrenosa   . Palpitations     negative stress echo in November 2012 with normal LV function; mild LVH, proximal septal thickening with narrow LVOT and mild gradient; mild MR and TR; Cardionet showed PACs in November 2012  . HTN (hypertension)   . Obesity   . HOCM (hypertrophic obstructive cardiomyopathy) 06/21/2011    2D ECHO - EF >55%,  normal  . Chest pain 11/22/2010    2D STRESS ECHO - EF 60%, peak stress EF 80%, normal, no evidence for stress-induced ischemia  . Shortness of breath 07/11/2011    MET TEST    Past Surgical History  Procedure Laterality Date  . Skin graft  2006    porcine, R leg    Family History  Problem Relation Age of Onset  . Cancer Neg Hx   . Coronary artery disease      F in his 18  . Diabetes      GP  . Heart disease Mother     endocarditis  . Ovarian cancer Mother   . Emphysema Father      History   Social History  . Marital Status: Single    Spouse Name: N/A    Number of Children: 0  . Years of Education: N/A   Occupational History  . para Statistician, part time cook    Social History Main Topics  . Smoking status: Never Smoker   . Smokeless tobacco: Never Used  . Alcohol Use: 3.5 - 7.0 oz/week    7-14 drink(s) per week     Comment: wine  . Drug Use: No  . Sexual Activity: Not Currently   Other Topics Concern  . Not on file   Social History Narrative   Lives by self    Review of systems: The patient specifically denies any chest pain at rest or with exertion, dyspnea at rest or with exertion, orthopnea, paroxysmal nocturnal dyspnea, syncope, palpitations, focal neurological deficits, intermittent claudication, lower extremity edema, unexplained weight gain, cough, hemoptysis or wheezing.  The patient also denies abdominal pain, nausea, vomiting, dysphagia, diarrhea, constipation, polyuria, polydipsia, dysuria, hematuria, frequency, urgency, abnormal bleeding or bruising, fever, chills, unexpected weight changes, mood swings, change in skin or hair texture, change in voice quality, auditory or visual problems, allergic reactions or rashes, new musculoskeletal complaints other than usual "aches and pains".   PHYSICAL EXAM BP 141/83 mmHg  Pulse 60  Resp 16  Ht _0  (1.626 m)  Wt 193 lb 11.2 oz (87.862 kg)  BMI 33.23 kg/m2 General: Alert, oriented x3, no distress, mildly obese  Head: no evidence of trauma, PERRL, EOMI, no exophtalmos or lid lag, no myxedema, no xanthelasma; normal ears, nose and oropharynx  Neck: normal jugular venous pulsations and no hepatojugular reflux; brisk carotid pulses without delay and no carotid bruits  Chest: clear to auscultation, no signs of consolidation by percussion or palpation, normal fremitus, symmetrical and full respiratory excursions  Cardiovascular: normal position and quality of the apical impulse,  regular rhythm, normal first and second heart sounds, grade 2/6 early peaking systolic ejection murmur in the aortic focus that increases with Valsalva maneuver, disappears with handgrip, no rubs or gallops  Abdomen: no tenderness or distention, no masses by palpation, no abnormal pulsatility or arterial bruits, normal bowel sounds, no hepatosplenomegaly  Extremities: no clubbing, cyanosis or edema; 2+ radial, ulnar and brachial pulses bilaterally; 2+ right femoral, posterior tibial and dorsalis pedis pulses; 2+ left femoral, posterior tibial and dorsalis pedis pulses; no subclavian or femoral bruits  Neurological: grossly nonfocal   EKG: NSR, minor IVCD with nonspecific ST segment changes  Lipid Panel     Component Value Date/Time   CHOL 229* 05/26/2011 0928   TRIG 59.0 05/26/2011 0928   HDL 100.90 05/26/2011 0928   CHOLHDL 2 05/26/2011 0928   VLDL 11.8 05/26/2011 0928   LDLDIRECT 121.8 05/26/2011 7672  BMET    Component Value Date/Time   NA 140 05/04/2013 2359   K 3.7 05/04/2013 2359   CL 103 05/04/2013 2359   CO2 24 05/04/2013 2359   GLUCOSE 85 05/04/2013 2359   BUN 23 05/04/2013 2359   CREATININE 0.73 05/04/2013 2359   CALCIUM 9.4 05/04/2013 2359   GFRNONAA >90 05/04/2013 2359   GFRAA >90 05/04/2013 2359     ASSESSMENT AND PLAN  Christine Jacobson's chest discomfort is atypical and may just be associated with anxiety, although she does have some coronary risk factors. She has had 2 functional studies in the last couple of years that were normal. She has no had anatomical assessment for CAD. I think should be an excellent candidate for a cardiac CT angiogram but she is unable to afford any cardiac tests at this time. Indeed, her financial worries may be the basis for many of her complaints.  Her murmur remains a bit of a puzzle. It clearly is a functional murmur that depends on loading conditions, thankfully without echocardiographic findings that would support a diagnosis of  hypertrophic obstructive cardiomyopathy or mitral valve prolapse with significant regurgitation.  I think she might benefit from intermittent low dose loop diuretic therapy for her fluid gain. I have advised her that most important would be salt restriction again.  We'll try to provide her a referral to a psychiatrist or counselor for anxiety.  Orders Placed This Encounter  Procedures  . Ambulatory referral to Psychiatry  . EKG 12-Lead   Meds ordered this encounter  Medications  . DISCONTD: hydrochlorothiazide (MICROZIDE) 12.5 MG capsule    Sig: Take 1 capsule (12.5 mg total) by mouth as needed.    Dispense:  30 capsule    Refill:  5  . furosemide (LASIX) 20 MG tablet    Sig: Take 1 tablet (20 mg total) by mouth daily.    Dispense:  30 tablet    Refill:  Forsan Kalliope Riesen, MD, [facility] [phone] office [phone] pager

## 2013-12-05 NOTE — Patient Instructions (Signed)
START Furosemide 20mg  daily for swelling.  A referral has been to [facility].  They will contact you to make an appointment.  Dr. Royann Shiversroitoru recommends that you schedule a follow-up appointment in: 3 months.

## 2013-12-18 ENCOUNTER — Telehealth: Payer: Self-pay | Admitting: Cardiovascular Disease

## 2013-12-18 NOTE — Telephone Encounter (Signed)
Pt said Dr C was suppose to find out some information for her.He told her this when he saw about 2 weeks ago.She asked for Myer HaffBarbara,you can give this message to her tomorrow.

## 2013-12-18 NOTE — Telephone Encounter (Signed)
Called patient back to clarify concerns. Pt states during last visit she discussed with Dr. Royann Shiversroitoru and Britta MccreedyBarbara possible referral to counseling services. Wanted to F/U on recommendation/referral need. Pt stated no other concerns were stated at this time.

## 2013-12-19 ENCOUNTER — Encounter: Payer: Self-pay | Admitting: *Deleted

## 2013-12-19 NOTE — Telephone Encounter (Signed)
Spoke with pt, she was given the number to [facility] [phone]. She does not require a referral to be seen.

## 2013-12-19 NOTE — Telephone Encounter (Signed)
This encounter was created in error - please disregard.

## 2014-03-05 ENCOUNTER — Ambulatory Visit: Payer: BC Managed Care – PPO | Admitting: Cardiovascular Disease

## 2014-04-05 IMAGING — CR DG CHEST 2V
2 series · 2 of 2 positions shown · non-contrast
Comparison: 10/13/2010

CLINICAL DATA: Fever, cough, congestion and hemoptysis.

EXAM:
CHEST - 2 VIEW

[w chest pa]
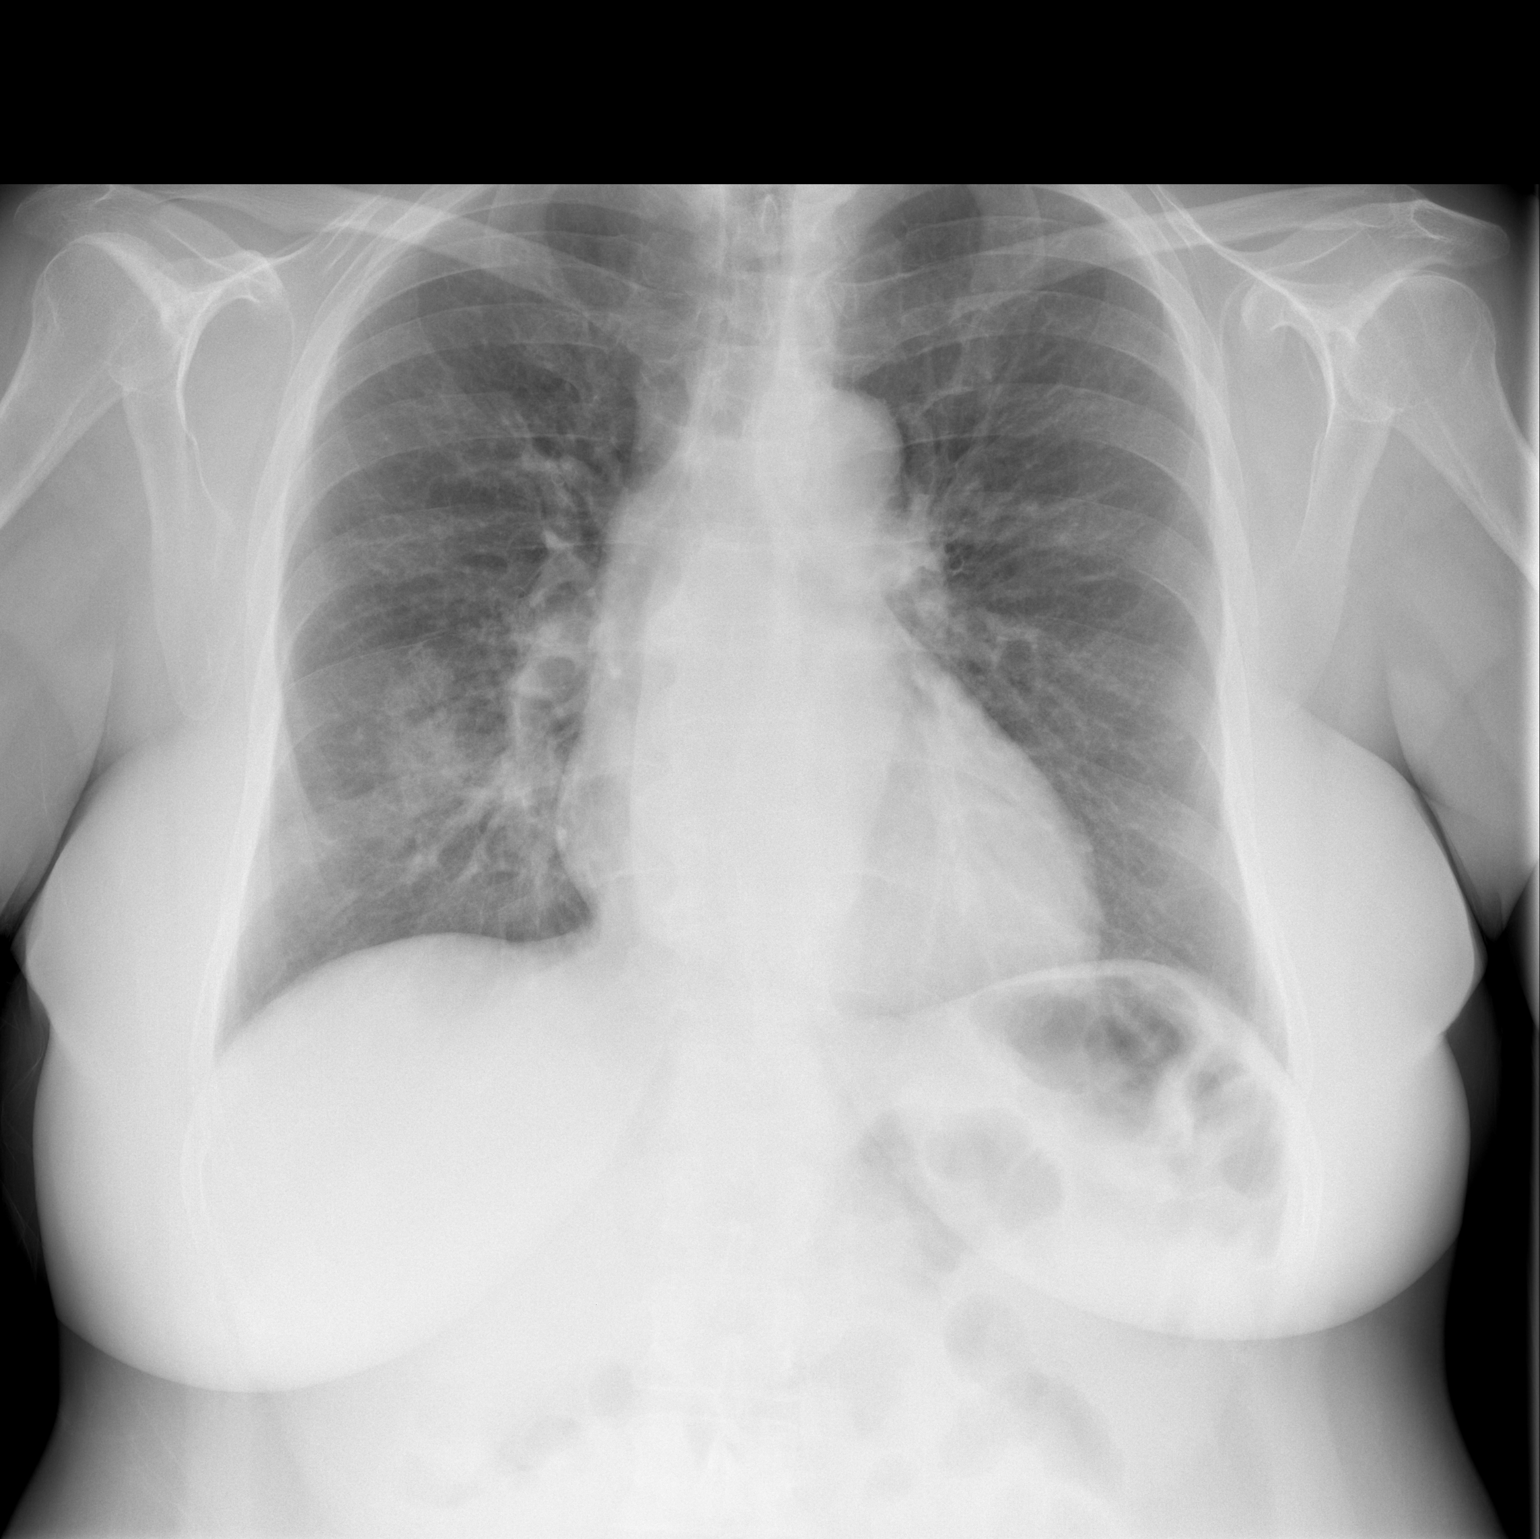

[w chest lat]
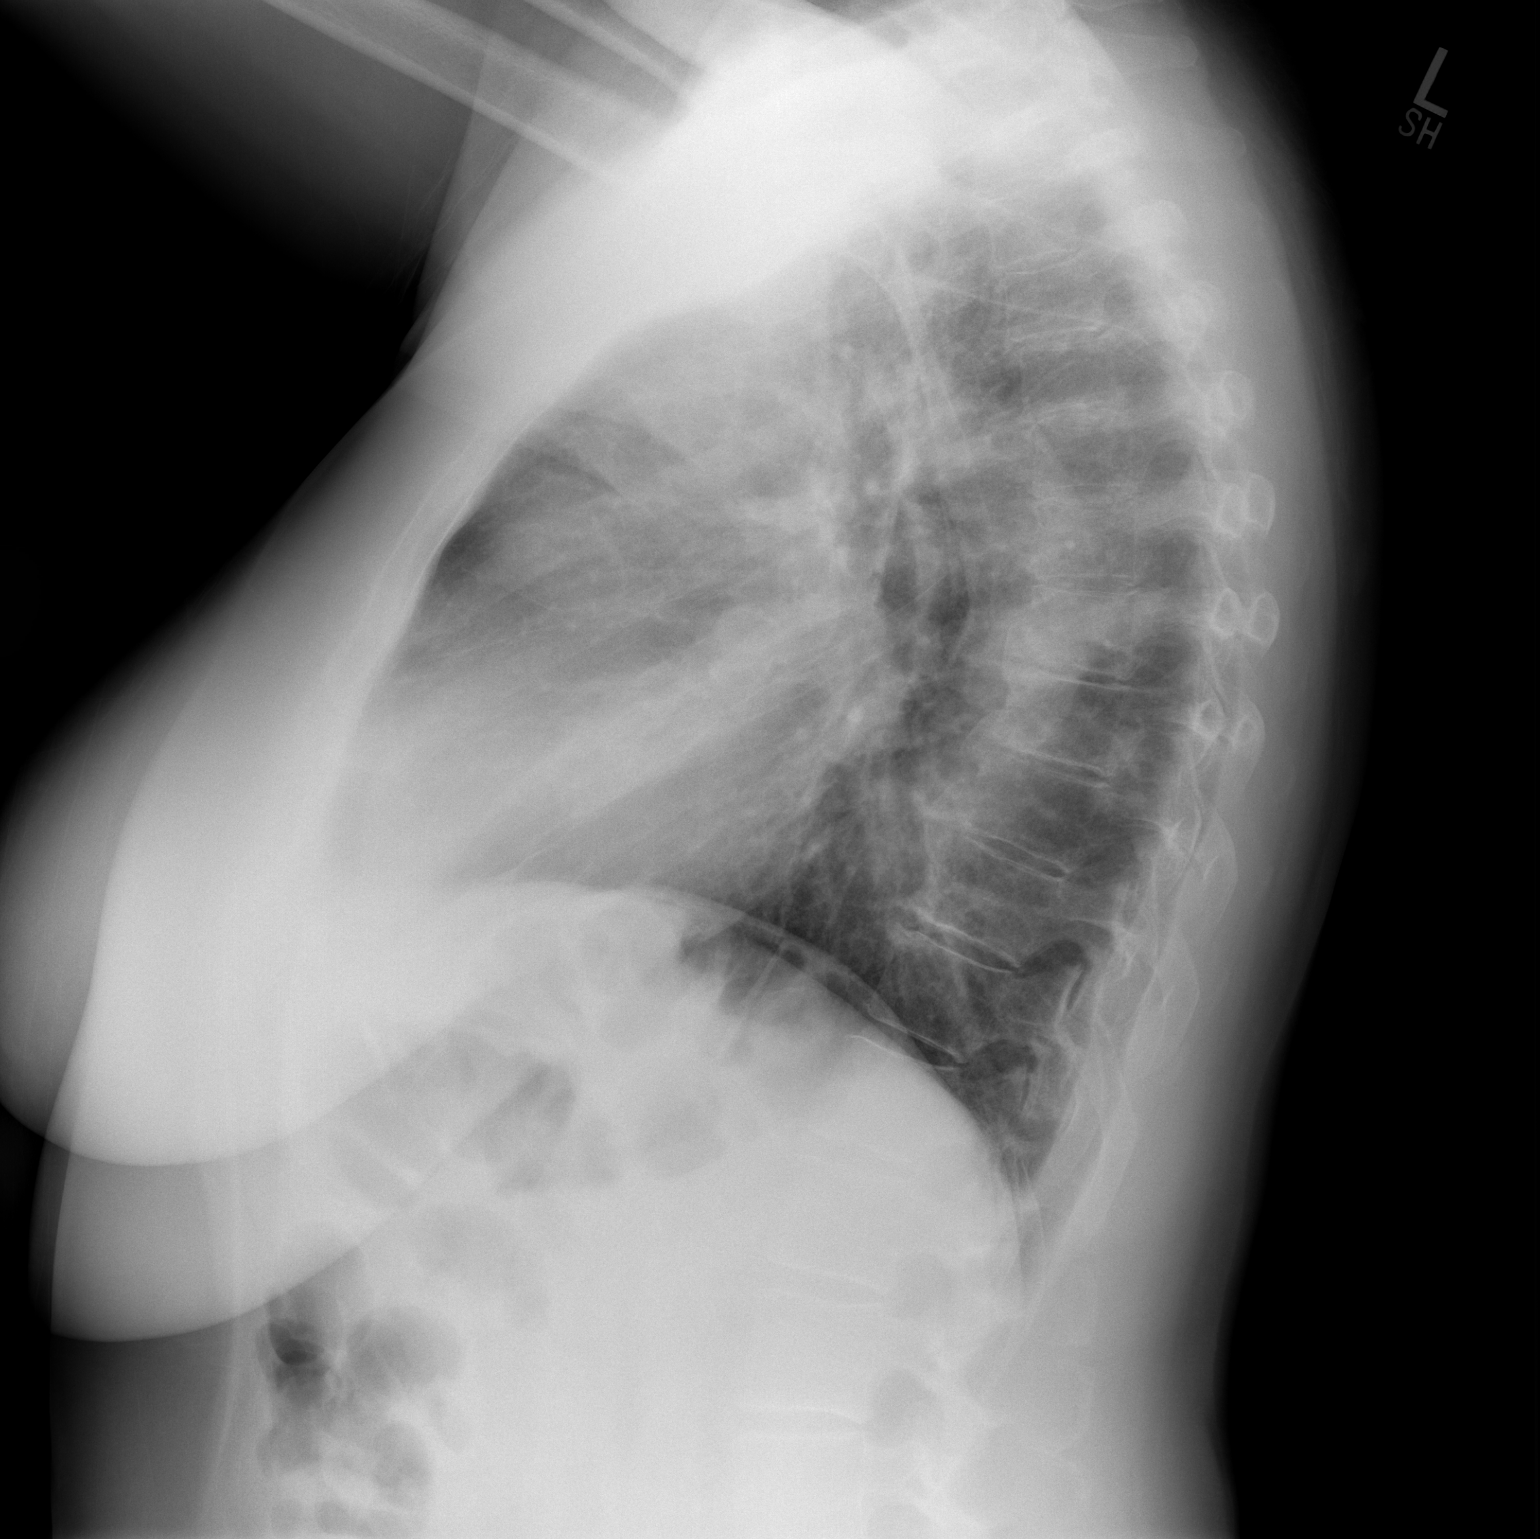

[2 of 2 positions shown; findings below may reference images not displayed]

FINDINGS: There is a new area of parenchymal lung opacity in the on the right
most likely localizing primarily to the right middle lobe but also
potentially extending into the right lower lobe. Findings are most
likely consistent with acute pneumonia. The dominant density is
somewhat nodular, measuring roughly 2.5 x 4 cm. Recommend close
radiographic followup, as underlying pulmonary mass cannot be
completely excluded.

There is no evidence of pulmonary edema or pleural fluid. The heart
size is normal. The aorta shows mild tortuosity. Degenerative
changes are present in the thoracic spine.
IMPRESSION: New parenchymal opacity of the right lung at the is likely
consistent with acute pneumonia involving the right middle lobe and
also potentially the right lower lung. The dominant density is
somewhat nodular and followup is recommended. If there is persistent
nodular opacity remaining, consider chest CT evaluation.

## 2014-04-25 ENCOUNTER — Telehealth: Payer: Self-pay | Admitting: Cardiovascular Disease

## 2014-04-25 MED ORDER — METOPROLOL SUCCINATE ER 25 MG PO TB24: ORAL_TABLET | ORAL | Status: DC

## 2014-04-25 MED ORDER — FUROSEMIDE 20 MG PO TABS: 20.0000 mg | ORAL_TABLET | Freq: Every day | ORAL | Status: DC

## 2014-04-25 NOTE — Telephone Encounter (Signed)
Spoke with patient and she needs Rx for metoprolol and lasix sent to pharmacy - she is changing jobs and will be on a 3 month probationary period before her new insurance kicks in.   Rx(s) sent to pharmacy electronically for #90 for lasix and metoprolol

## 2014-04-25 NOTE — Telephone Encounter (Signed)
Please call,need to discuss getting her medicine called in until her new insurance kick in.

## 2014-06-20 ENCOUNTER — Ambulatory Visit: Payer: Self-pay | Admitting: Internal Medicine

## 2014-09-17 ENCOUNTER — Encounter: Payer: Self-pay | Admitting: Physician Assistant

## 2014-09-17 ENCOUNTER — Ambulatory Visit (INDEPENDENT_AMBULATORY_CARE_PROVIDER_SITE_OTHER): Payer: BLUE CROSS/BLUE SHIELD | Admitting: Physician Assistant

## 2014-09-17 VITALS — BP 130/84 | HR 66 | Ht 64.0 in | Wt 196.6 lb

## 2014-09-17 DIAGNOSIS — E785 Hyperlipidemia, unspecified: Secondary | ICD-10-CM | POA: Diagnosis not present

## 2014-09-17 DIAGNOSIS — R011 Cardiac murmur, unspecified: Secondary | ICD-10-CM | POA: Diagnosis not present

## 2014-09-17 DIAGNOSIS — I10 Essential (primary) hypertension: Secondary | ICD-10-CM

## 2014-09-17 DIAGNOSIS — R600 Localized edema: Secondary | ICD-10-CM | POA: Insufficient documentation

## 2014-09-17 DIAGNOSIS — R079 Chest pain, unspecified: Secondary | ICD-10-CM

## 2014-09-17 NOTE — Patient Instructions (Signed)
Your physician recommends that you schedule a follow-up appointment in: December with Dr. Royann Shivers.

## 2014-09-17 NOTE — Progress Notes (Signed)
Patient ID: Christine Jacobson, female   DOB: [birth year], 59 y.o.   MRN: [id number]     Date:  09/17/2014   ID:  Christine Jacobson, DOB 11-Jan-1956, MRN [id number]  PCP:  Kathlene November, MD  Primary Cardiologist:  [name]  Chief Complaint  Patient presents with  . Follow-up    Intermittent episodes of chest pain.  Occas. wakes in the middle of the night wheezing and/or congested. Now sleeping with 3-4 pillows. Bilateral ankle edema.     History of Present Illness: Christine Jacobson is a 59 y.o. female with a history of HOCM, hypertension, palpitations, hyperlipidemia, hyperprolactinemia, obesity, pyoderma gangraenosa.  She has a history of syncopal event when talking to a policeman in the middle of the road, when her car broke down. The prodrome was highly suggestive of vasovagal syncope and she recalls passing out during stressful situations in the past. She has never had an heralded syncope or syncope associated with injury. No syncope since last May.  She has episodes of chest discomfort radiating into her left arm that occur at rest, not with exertion. She has also developed edema of her lower extremities and has exertional shortness of breath, functional class II (when asking a little more detail about her exercise tolerance, this hasn't really changed). She has been poorly compliant with sodium restriction. She "craves salt". She has gained weight and feels that her anxiety is making her eat more than she should.  As on previous evaluations she has a very distinct systolic murmur that is abolished by hand grip and intensifies after the Valsalva maneuver. Echo evaluation has shown at most a minimal increase in velocities across the left ventricular outflow tract. She does not have the typical echo phenotype of true obstructive cardiomyopathy.  She has previously had a stress echocardiogram and a cardiopulmonary stress test, neither one of which showed findings suggestive of hypertrophic obstructive  cardiomyopathy or mitral valve prolapse or any regional wall motion abnormalities or other signs of CAD. She has normal left ventricular ejection fraction. She has never had anatomical assessment for coronary disease.  During her previous office visit in November she was offered a cardiac CT but given her financial situation she declined. She presents today with complaints of chest discomfort, wheezing and left lower extremity edema.  She reports chest discomfort for 2-3 weeks now and states she feels congested during the night. Her last episode of chest pain was on Saturday after eating a bagel.  She had gone home took a nitroglycerin and fell asleep in her pain had resolved. Christine Jacobson does not appear that the nitroglycerin improved her symptoms.  The chest pain is nonexertional. She walks up flights of stairs on a repeated basis and does not have chest pain.  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  She does a lot of bread baking as a hobby which makes her feel really good.  She says she is trying to watch her salt intake and is using Mrs.-now. She does report being under a lot of stress which seems to be a chronic thing for her.  Swelling in her legs has been off and on all summer. She is taking her Lasix daily   The patient currently denies nausea, vomiting, fever, shortness of breath, orthopnea, dizziness, PND, cough, congestion, abdominal pain, hematochezia, melena, claudication.  Wt Readings from Last 3 Encounters:  09/17/14 196 lb 9.6 oz (89.177 kg)  12/05/13  193 lb 11.2 oz (87.862 kg)  05/04/13 178 lb (80.74 kg)     Past Medical History  Diagnosis Date  . Mild hyperlipidemia   . Liver function test abnormality     normal when repeated  . Hyperprolactinemia     dx in her 4, took meds, self d/c a while back  . Pyoderma gangrenosa   . Palpitations     negative stress echo in November 2012 with normal LV function; mild  LVH, proximal septal thickening with narrow LVOT and mild gradient; mild MR and TR; Cardionet showed PACs in November 2012  . HTN (hypertension)   . Obesity   . HOCM (hypertrophic obstructive cardiomyopathy) 06/21/2011    2D ECHO - EF >55%, normal  . Chest pain 11/22/2010    2D STRESS ECHO - EF 60%, peak stress EF 80%, normal, no evidence for stress-induced ischemia  . Shortness of breath 07/11/2011    MET TEST    Current Outpatient Prescriptions  Medication Sig Dispense Refill  . aspirin 81 MG tablet Take 81 mg by mouth daily as needed (takes when out of Toprol). Only taking occas    . furosemide (LASIX) 20 MG tablet Take 1 tablet (20 mg total) by mouth daily. 90 tablet 1  . Ibuprofen 200 MG CAPS Take 200-800 mg by mouth every 6 (six) hours as needed (headache or pain).     . metoprolol succinate (TOPROL-XL) 25 MG 24 hr tablet TAKE 1 TABLET (25 MG TOTAL) BY MOUTH 2 (TWO) TIMES DAILY. 180 tablet 1  . nitroGLYCERIN (NITROSTAT) 0.4 MG SL tablet 1 sublingual every 5 minutes 3 times if chest pain 20 tablet 1   No current facility-administered medications for this visit.    Allergies:   No Known Allergies  Social History:  The patient  reports that she has never smoked. She has never used smokeless tobacco. She reports that she drinks about 3.5 - 7.0 oz of alcohol per week. She reports that she does not use illicit drugs.   Family history:   Family History  Problem Relation Age of Onset  . Cancer Neg Hx   . Coronary artery disease      F in his 10  . Diabetes      GP  . Heart disease Mother     endocarditis  . Ovarian cancer Mother   . Emphysema Father     ROS:  Please see the history of present illness.  All other systems reviewed and negative.   PHYSICAL EXAM: VS:  BP 130/84 mmHg  Pulse 66  Ht 5' 4"  (1.626 m)  Wt 196 lb 9.6 oz (89.177 kg)  BMI 33.73 kg/m2 Obese, well developed, in no acute distress HEENT: Pupils are equal round react to light accommodation extraocular  movements are intact.  Neck: no JVDNo cervical lymphadenopathy. Cardiac: Regular rate and rhythm 2/6 systolic murmur. Lungs:  clear to auscultation bilaterally, no wheezing, rhonchi or rales Abd: soft, nontender, positive bowel sounds all quadrants, no hepatosplenomegaly Ext: 1+ left greater than right lower extremity edema.  2+ radial and dorsalis pedis pulses. Skin: warm and dry Neuro:  Grossly normal  EKG:  Normal sinus rhythm atrial enlargement. Rate 66 bpm  ASSESSMENT AND PLAN:  Problem List Items Addressed This Visit    Chest pain - Primary   Relevant Orders   EKG 12-Lead      Chest pain:  I again offered the patient a cardiac CTA. However, I do not think this is cardiac related  as she she can exert herself by going up stairs and not have chest pain. She does appear quite anxious and seems more worried about wasting my time.  She also has a very high deductible on her insurance.  Hyperlipidemia:  I don't see any current results. We should probably recheck this in the near future.  Essential hypertension:  Blood pressure controlled at this time.  Lower extremity edema:  This apparently comes and goes all summer. She is on her feet and walking a good part of the day I doubt is related to a blood clot.  She will continue to take Lasix.  Wheezing:  This continues she may need spirometry with bronchodilation challenge. However, she is not wheezing on exam today her lungs are clear.

## 2014-10-01 ENCOUNTER — Other Ambulatory Visit: Payer: Self-pay

## 2014-10-01 MED ORDER — METOPROLOL SUCCINATE ER 25 MG PO TB24: ORAL_TABLET | ORAL | Status: DC

## 2014-11-06 ENCOUNTER — Other Ambulatory Visit: Payer: Self-pay | Admitting: Cardiovascular Disease

## 2014-11-21 ENCOUNTER — Other Ambulatory Visit: Payer: Self-pay | Admitting: Cardiovascular Disease

## 2014-11-21 MED ORDER — FUROSEMIDE 20 MG PO TABS: 20.0000 mg | ORAL_TABLET | Freq: Every day | ORAL | Status: DC

## 2014-11-24 ENCOUNTER — Encounter: Payer: Self-pay | Admitting: Cardiovascular Disease

## 2014-12-25 ENCOUNTER — Telehealth: Payer: Self-pay | Admitting: Internal Medicine

## 2014-12-25 NOTE — Telephone Encounter (Signed)
Pt declines the flu vaccine. She does not believe in having it and would like chart to note this.

## 2014-12-26 NOTE — Telephone Encounter (Signed)
Noted, Pt has not been seen since 2014, recommended to scheduled 30 minute appt.

## 2014-12-29 ENCOUNTER — Ambulatory Visit: Payer: BLUE CROSS/BLUE SHIELD | Admitting: Cardiovascular Disease

## 2015-01-07 ENCOUNTER — Telehealth: Payer: Self-pay | Admitting: Cardiovascular Disease

## 2015-01-07 NOTE — Telephone Encounter (Signed)
Returned call to patient.She stated she would like to be referred to [facility].Message sent to Dr.[name].

## 2015-01-07 NOTE — Telephone Encounter (Signed)
Gladly, can use diastolic HF as diagnosis

## 2015-01-07 NOTE — Telephone Encounter (Signed)
Returned call to patient no answer.Left message on personal voice Dr.[name] advised ok for referral to cardiac rehab.  [place] [facility] called left message on personal voice mail to fax order for Dr.[name] to sign.

## 2015-01-07 NOTE — Telephone Encounter (Signed)
Please call,she wants to get a referral to Card[facility].

## 2015-01-08 NOTE — Telephone Encounter (Signed)
Received cardiac rehab order [facility].Order put in Dr.[name]'s folder to be signed 01/09/15.Nya will fax order.

## 2015-01-09 ENCOUNTER — Ambulatory Visit: Payer: BLUE CROSS/BLUE SHIELD | Admitting: Cardiovascular Disease

## 2015-01-22 ENCOUNTER — Telehealth: Payer: Self-pay | Admitting: *Deleted

## 2015-01-22 NOTE — Telephone Encounter (Signed)
Authorization to start exercising at [facility] - instructed to stay well hydrated and allow longer cool down period (tendency to have vasovagal response).

## 2015-02-20 ENCOUNTER — Encounter: Payer: Self-pay | Admitting: Cardiovascular Disease

## 2015-02-20 ENCOUNTER — Ambulatory Visit (INDEPENDENT_AMBULATORY_CARE_PROVIDER_SITE_OTHER): Payer: BLUE CROSS/BLUE SHIELD | Admitting: Cardiovascular Disease

## 2015-02-20 DIAGNOSIS — R55 Syncope and collapse: Secondary | ICD-10-CM | POA: Diagnosis not present

## 2015-02-20 DIAGNOSIS — I10 Essential (primary) hypertension: Secondary | ICD-10-CM

## 2015-02-20 DIAGNOSIS — R011 Cardiac murmur, unspecified: Secondary | ICD-10-CM

## 2015-02-20 DIAGNOSIS — R9431 Abnormal electrocardiogram [ECG] [EKG]: Secondary | ICD-10-CM | POA: Diagnosis not present

## 2015-02-20 NOTE — Progress Notes (Signed)
Patient ID: Christine Jacobson, female   DOB: 1955/03/04, 59 y.o.   MRN: [id number]    Cardiology Office Note    Date:  02/22/2015   ID:  Christine Jacobson, DOB 10/31/1955, MRN [id number]  PCP:  Kathlene November, MD  Cardiologist:   Sanda Klein, MD   Chief Complaint  Patient presents with  . Follow-up     having some chest currently, has little shortness of breath, has edema, no pain or cramping in legs, no lightheadedness or dizziness    History of Present Illness:  Christine Jacobson is a 60 y.o. female with a history of mild hypertension, syncope (probably vasovagal), exertional dyspnea, lower extremity edema and a very loud systolic murmur that varies in intensity with unloading maneuvers.  Since the last appointment she has not had syncopal events but does occasionally have dizzy spells, not necessarily related to exertion. She is planning to join a gym. She is still unhappy and stressed about her job.  Despite the fact that she reports good compliance with sodium restriction she has had to increase her furosemide to 20 mg 2 tablets daily due to recurrent lower extremity edema. Her shortness of breath his not a big complaint at this time. She denies dizziness and lightheadedness and has not had any syncope. She continues to have unpredictable and poorly defined chest discomfort, not related to exertion.  As on previous evaluations she has a very loud and very distinct systolic murmur that is diminished with handgrip and intensified by the Valsalva maneuver. Performing the Valsalva maneuver makes her feel little dizzy. Previous ECG, including the one today show evidence of left atrial abnormality and left ventricular hypertrophy, but echo has not shown evidence of significant left ventricular outflow tract obstruction or mitral valve prolapse or really any significant thickening of the left ventricle. Left ventricular systolic function is normal and she had mild mitral regurgitation. Cardio pulmonary stress  testing did not show evidence of ischemic response, but heart rate achieved was only 74% of maximum predicted (2013).  Past Medical History  Diagnosis Date  . Mild hyperlipidemia   . Liver function test abnormality     normal when repeated  . Hyperprolactinemia ([place])     dx in her 70, took meds, self d/c a while back  . Pyoderma gangrenosa   . Palpitations     negative stress echo in November 2012 with normal LV function; mild LVH, proximal septal thickening with narrow LVOT and mild gradient; mild MR and TR; Cardionet showed PACs in November 2012  . HTN (hypertension)   . Obesity   . HOCM (hypertrophic obstructive cardiomyopathy) ([place]) 06/21/2011    2D ECHO - EF >55%, normal  . Chest pain 11/22/2010    2D STRESS ECHO - EF 60%, peak stress EF 80%, normal, no evidence for stress-induced ischemia  . Shortness of breath 07/11/2011    MET TEST    Past Surgical History  Procedure Laterality Date  . Skin graft  2006    porcine, R leg    Outpatient Prescriptions Prior to Visit  Medication Sig Dispense Refill  . aspirin 81 MG tablet Take 81 mg by mouth daily as needed (takes when out of Toprol). Only taking occas    . Ibuprofen 200 MG CAPS Take 200-800 mg by mouth every 6 (six) hours as needed (headache or pain).     . metoprolol succinate (TOPROL-XL) 25 MG 24 hr tablet TAKE 1 TABLET (25 MG TOTAL) BY MOUTH 2 (TWO) TIMES DAILY. 180 tablet 1  .  nitroGLYCERIN (NITROSTAT) 0.4 MG SL tablet 1 sublingual every 5 minutes 3 times if chest pain 20 tablet 1  . furosemide (LASIX) 20 MG tablet Take 1 tablet (20 mg total) by mouth daily. (Patient taking differently: Take 20 mg by mouth 2 (two) times daily. ) 90 tablet 2   No facility-administered medications prior to visit.     Allergies:   Review of patient's allergies indicates no known allergies.   Social History   Social History  . Marital Status: Single    Spouse Name: N/A  . Number of Children: 0  . Years of Education: N/A   Occupational  History  . para Statistician, part time cook    Social History Main Topics  . Smoking status: Never Smoker   . Smokeless tobacco: Never Used  . Alcohol Use: 3.5 - 7.0 oz/week    7-14 drink(s) per week     Comment: wine  . Drug Use: No  . Sexual Activity: Not Currently   Other Topics Concern  . None   Social History Narrative   Lives by self     Family History:  The patient's family history includes Emphysema in her father; Heart disease in her mother; Ovarian cancer in her mother. There is no history of Cancer.   ROS:   Please see the history of present illness.    ROS All other systems reviewed and are negative.   PHYSICAL EXAM:   VS:  BP 114/76 mmHg  Pulse 73  Ht _0  (1.626 m)  Wt 90.351 kg (199 lb 3 oz)  BMI 34.17 kg/m2   GEN: Well nourished, well developed, in no acute distress HEENT: normal Neck: no JVD, carotid bruits, or masses Cardiac: RRR; grade 3/6 early to mid peaking systolic ejection murmur heard best in the right upper sternal border, less distinct towards the apex, intensifies with the Valsalva maneuver and diminishes with handgrip; no diastolic murmurs, rubs, or gallops,no edema  Respiratory:  clear to auscultation bilaterally, normal work of breathing GI: soft, nontender, nondistended, + BS MS: no deformity or atrophy Skin: warm and dry, no rash Neuro:  Alert and Oriented x 3, Strength and sensation are intact Psych: euthymic mood, full affect  Wt Readings from Last 3 Encounters:  02/20/15 90.351 kg (199 lb 3 oz)  09/17/14 89.177 kg (196 lb 9.6 oz)  12/05/13 87.862 kg (193 lb 11.2 oz)      Studies/Labs Reviewed:   EKG:  EKG is ordered today.  The ekg ordered today demonstrates sinus rhythm, left atrial abnormality, left ventricular hypertrophy with ST depression and T-wave inversion in leads 1 and aVL. T-wave inversion is more distinct today in the aVL, otherwise similar to previous tracings. Borderline prolonged QRS at 98 ms and borderline  QTC 456 ms  Recent Labs: No results found for requested labs within last 365 days.   Lipid Panel    Component Value Date/Time   CHOL 229* 05/26/2011 0928   TRIG 59.0 05/26/2011 0928   HDL 100.90 05/26/2011 0928   CHOLHDL 2 05/26/2011 0928   VLDL 11.8 05/26/2011 0928   LDLDIRECT 121.8 05/26/2011 0928    ASSESSMENT:    1. Murmur   2. Abnormal ECG   3. Essential hypertension   4. Vasovagal syncope      PLAN:  In order of problems listed above:  1. Her murmur is quite striking and we have yet to find a good explanation for it. I recommend repeat echocardiogram with the Valsalva maneuver. I'm  still concerned that she may have hypertrophic cardiomyopathy. Note that she did not tolerate beta blockers well when higher doses were used 2. LVH by ECG criteria, cannot exclude superimposed ischemia 3. Good blood pressure control 4. Avoid excessive diuretic use since this may increase the likelihood of both vasovagal syncope and LVOT obstruction should this be present.    Medication Adjustments/Labs and Tests Ordered: Current medicines are reviewed at length with the patient today.  Concerns regarding medicines are outlined above.  Medication changes, Labs and Tests ordered today are listed in the Patient Instructions below. Patient Instructions  Your physician has requested that you have an echocardiogram. Echocardiography is a painless test that uses sound waves to create images of your heart. It provides your doctor with information about the size and shape of your heart and how well your heart's chambers and valves are working. This procedure takes approximately one hour. There are no restrictions for this procedure.  Dr. Sallyanne Kuster recommends that you schedule a follow-up appointment in: [place]  02/22/2015 9:30 AM    [facility] [place], [place], [place]  [address] Phone: [phone]; Fax: [phone]

## 2015-02-20 NOTE — Patient Instructions (Signed)
Your physician has requested that you have an echocardiogram. Echocardiography is a painless test that uses sound waves to create images of your heart. It provides your doctor with information about the size and shape of your heart and how well your heart's chambers and valves are working. This procedure takes approximately one hour. There are no restrictions for this procedure.  Dr. [name] recommends that you schedule a follow-up appointment in: ONE YEAR   

## 2015-03-05 ENCOUNTER — Other Ambulatory Visit: Payer: Self-pay

## 2015-03-05 ENCOUNTER — Telehealth: Payer: Self-pay | Admitting: Cardiovascular Disease

## 2015-03-05 ENCOUNTER — Ambulatory Visit (HOSPITAL_COMMUNITY): Payer: BLUE CROSS/BLUE SHIELD | Attending: Internal Medicine

## 2015-03-05 DIAGNOSIS — E785 Hyperlipidemia, unspecified: Secondary | ICD-10-CM | POA: Diagnosis not present

## 2015-03-05 DIAGNOSIS — Z8249 Family history of ischemic heart disease and other diseases of the circulatory system: Secondary | ICD-10-CM | POA: Insufficient documentation

## 2015-03-05 DIAGNOSIS — Q244 Congenital subaortic stenosis: Secondary | ICD-10-CM | POA: Diagnosis not present

## 2015-03-05 DIAGNOSIS — I071 Rheumatic tricuspid insufficiency: Secondary | ICD-10-CM | POA: Diagnosis not present

## 2015-03-05 DIAGNOSIS — I340 Nonrheumatic mitral (valve) insufficiency: Secondary | ICD-10-CM | POA: Diagnosis not present

## 2015-03-05 DIAGNOSIS — R011 Cardiac murmur, unspecified: Secondary | ICD-10-CM | POA: Insufficient documentation

## 2015-03-05 DIAGNOSIS — I119 Hypertensive heart disease without heart failure: Secondary | ICD-10-CM | POA: Diagnosis not present

## 2015-03-05 DIAGNOSIS — I421 Obstructive hypertrophic cardiomyopathy: Secondary | ICD-10-CM | POA: Diagnosis present

## 2015-03-05 NOTE — Telephone Encounter (Signed)
Follow Up  Pt called crying. Request a call back as soon as you get the ECHO results.

## 2015-03-05 NOTE — Telephone Encounter (Signed)
Forward to [employer] CMA

## 2015-03-06 ENCOUNTER — Telehealth: Payer: Self-pay | Admitting: *Deleted

## 2015-03-06 MED ORDER — METOPROLOL SUCCINATE ER 25 MG PO TB24: ORAL_TABLET | ORAL | Status: DC

## 2015-03-06 NOTE — Telephone Encounter (Signed)
LM with echo results.  Instructed to increase Metoprolol to  AM and  PM.  New Rx sent to pharmacy.  Message sent to scheduling for an appt in 4-8 weeks.

## 2015-03-06 NOTE — Telephone Encounter (Signed)
[facility] 03/06/15 2:01pm

## 2015-03-06 NOTE — Telephone Encounter (Signed)
Follow up  ° ° ° °Returning call back to nurse  °

## 2015-03-06 NOTE — Telephone Encounter (Signed)
Echo results given to patient.  Voiced understanding.  Will increase metoprolol to 25 mg in the AM and 50 mg in the evening.  appt will be scheduled in 4-8 weeks.

## 2015-03-06 NOTE — Telephone Encounter (Signed)
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 Would like to try to increase her metoprolol again. Can she please try to take 50 mg in the evening and 25 mg in AM? And can I see her back in clinic in a few weeks (4-8)?

## 2015-03-09 ENCOUNTER — Telehealth: Payer: Self-pay | Admitting: Cardiovascular Disease

## 2015-03-09 NOTE — Telephone Encounter (Signed)
She wants to know if she can go to the fitness center? If not there please leave message on her voicemail.

## 2015-03-09 NOTE — Telephone Encounter (Signed)
Yes, she can. No weightlifting, please

## 2015-03-09 NOTE — Telephone Encounter (Signed)
After giving Echo results wants to make sure she can participate in an exercise program done through [facility] maintenance program.  Will verify with Dr. Salena Saner and advise.

## 2015-03-09 NOTE — Telephone Encounter (Signed)
LM with instructions

## 2015-03-13 ENCOUNTER — Telehealth: Payer: Self-pay | Admitting: Cardiovascular Disease

## 2015-03-13 NOTE — Telephone Encounter (Signed)
Yes please

## 2015-03-16 MED ORDER — FUROSEMIDE 20 MG PO TABS: 20.0000 mg | ORAL_TABLET | Freq: Two times a day (BID) | ORAL | Status: DC

## 2015-03-16 NOTE — Telephone Encounter (Signed)
Sent Rx to pharmacy--Furosemide 20 mg 1 PO BID.

## 2015-04-26 ENCOUNTER — Other Ambulatory Visit: Payer: Self-pay | Admitting: Cardiovascular Disease

## 2015-04-27 NOTE — Telephone Encounter (Signed)
REFILL 

## 2015-05-11 ENCOUNTER — Ambulatory Visit: Payer: Self-pay | Admitting: Cardiovascular Disease

## 2015-07-29 ENCOUNTER — Other Ambulatory Visit: Payer: Self-pay | Admitting: Cardiovascular Disease

## 2015-09-08 ENCOUNTER — Other Ambulatory Visit: Payer: Self-pay | Admitting: Cardiovascular Disease

## 2015-09-08 NOTE — Telephone Encounter (Signed)
Rx(s) sent to pharmacy electronically.  

## 2015-09-14 ENCOUNTER — Telehealth: Payer: Self-pay | Admitting: Cardiovascular Disease

## 2015-09-14 MED ORDER — FUROSEMIDE 20 MG PO TABS: 20.0000 mg | ORAL_TABLET | Freq: Two times a day (BID) | ORAL | 3 refills | Status: DC

## 2015-09-14 NOTE — Telephone Encounter (Signed)
Received a call from pharmacist at [facility] requesting a 90 day refill for lasix, advised ok.

## 2015-09-14 NOTE — Telephone Encounter (Signed)
Christine Jacobson is calling to get a authorization to have a 90 day supply versus a 30 day supply , due to her insurance will only pay for only a 90 day supply

## 2015-09-29 ENCOUNTER — Other Ambulatory Visit: Payer: Self-pay | Admitting: Cardiovascular Disease

## 2016-01-22 ENCOUNTER — Encounter: Payer: Self-pay | Admitting: Cardiovascular Disease

## 2016-01-22 ENCOUNTER — Ambulatory Visit (INDEPENDENT_AMBULATORY_CARE_PROVIDER_SITE_OTHER): Payer: 59 | Admitting: Cardiovascular Disease

## 2016-01-22 VITALS — BP 130/90 | HR 71 | Wt 205.0 lb

## 2016-01-22 DIAGNOSIS — I10 Essential (primary) hypertension: Secondary | ICD-10-CM | POA: Diagnosis not present

## 2016-01-22 DIAGNOSIS — I421 Obstructive hypertrophic cardiomyopathy: Secondary | ICD-10-CM

## 2016-01-22 DIAGNOSIS — R55 Syncope and collapse: Secondary | ICD-10-CM | POA: Diagnosis not present

## 2016-01-22 MED ORDER — METOPROLOL SUCCINATE ER 50 MG PO TB24: 50.0000 mg | ORAL_TABLET | Freq: Two times a day (BID) | ORAL | 3 refills | Status: DC

## 2016-01-22 NOTE — Patient Instructions (Signed)
Dr Royann Shiversroitoru has recommended making the following medication changes: 1. TAKE Metoprolol - 1 tablet (50 mg total) every morning and 2 tablets (100 mg total) every evening  Your physician recommends that you schedule a follow-up appointment in 6 months.  If you need a refill on your cardiac medications before your next appointment, please call your pharmacy.
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
## 2016-07-25 ENCOUNTER — Other Ambulatory Visit: Payer: Self-pay | Admitting: Cardiovascular Disease

## 2016-08-24 ENCOUNTER — Telehealth: Payer: Self-pay | Admitting: Cardiovascular Disease

## 2016-08-24 NOTE — Telephone Encounter (Signed)
New message    Pt is calling.   Pt c/o Shortness Of Breath: STAT if SOB developed within the last 24 hours or pt is noticeably SOB on the phone  1. Are you currently SOB (can you hear that pt is SOB on the phone)? Almost.  2. How long have you been experiencing SOB? Since yesterday  3. Are you SOB when sitting or when up moving around? Up moving around  4. Are you currently experiencing any other symptoms? Chest heaviness.

## 2016-08-24 NOTE — Telephone Encounter (Signed)
Spoke with patient and she stated she had bronchitis with bronchospasms in February, diagnosed at Urgent Care. Since this she has continued to feel short of breath at times. She did have episode of tightness, subsided. She has no known CAD. Patient scheduled for first available 09/15/16 with Bjorn Loserhonda B PA. Did advised patient to follow up with PCP as well since started after illness in February  Last Echo 03/05/2015 Will forward to Dr Rubie Maidroituro for review

## 2016-09-15 ENCOUNTER — Ambulatory Visit: Payer: 59 | Admitting: Physician Assistant

## 2016-09-23 ENCOUNTER — Ambulatory Visit (INDEPENDENT_AMBULATORY_CARE_PROVIDER_SITE_OTHER): Payer: BLUE CROSS/BLUE SHIELD | Admitting: Cardiovascular Disease

## 2016-09-23 VITALS — BP 130/80 | HR 60 | Ht 64.0 in | Wt 197.0 lb

## 2016-09-23 DIAGNOSIS — I5032 Chronic diastolic (congestive) heart failure: Secondary | ICD-10-CM

## 2016-09-23 DIAGNOSIS — I421 Obstructive hypertrophic cardiomyopathy: Secondary | ICD-10-CM | POA: Diagnosis not present

## 2016-09-23 DIAGNOSIS — Z87898 Personal history of other specified conditions: Secondary | ICD-10-CM

## 2016-09-23 DIAGNOSIS — I10 Essential (primary) hypertension: Secondary | ICD-10-CM | POA: Diagnosis not present

## 2016-09-23 MED ORDER — POTASSIUM CHLORIDE ER 10 MEQ PO TBCR: 10.0000 meq | EXTENDED_RELEASE_TABLET | Freq: Two times a day (BID) | ORAL | 3 refills | Status: DC

## 2016-09-23 NOTE — Progress Notes (Signed)
Patient ID: Christine Jacobson, female   DOB: 01/07/1956, 61 y.o.   MRN: [id number]    Cardiology Office Note    Date:  09/25/2016   ID:  Christine Jacobson, DOB 1955/08/11, MRN [id number]  PCP:  Patient, No Pcp Per  Cardiologist:   Sanda Klein, MD   Chief Complaint  Patient presents with  . Follow-up    CHEST PAIN/ DOE, SWELLING HEAVY SWEATING     History of Present Illness:  Christine Jacobson is a 61 y.o. female with HOCM and a history of mild hypertension, syncope (probably vasovagal).  Dashanique has recently had problems with worsening leg edema, which is uncomfortable and sometimes makes it hard to put her shoes on. She has also noticed dyspnea, mostly functional class III. She has occasional dizzy spells but has not experienced syncope.  She had pneumonia in March and received antibiotics and prednisone. She feels like she hasn't been the same since then. She develops shortness of breath with activities of daily living. She reports being 189 pounds on her home scale which is a big difference from our office scale (8 lb more).  Denies chest pain, palpitations, claudication or focal neurological events. She is taking a total of 150 mg of metoprolol succinate in a 24-hour period, she has found that the best schedule for her is 50 mg taken 3 times a day.  The echocardiogram performed January 2018 showed a complex of findings that is highly consistent with hypertrophic obstructive cardiomyopathy including severe basal septal hypertrophy (2.5 cm) and LV outflow tract obstruction with a resting gradient of 64 mmHg increasing to 144 mmHg following the Valsalva maneuver. Mitral regurgitation was mild. The left atrium was severely dilated.  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   Past Medical History:  Diagnosis Date  . Chest pain 11/22/2010   2D STRESS ECHO - EF 60%, peak stress EF 80%, normal, no evidence for stress-induced ischemia  . HOCM  (hypertrophic obstructive cardiomyopathy) ([place]) 06/21/2011   2D ECHO - EF >55%, normal  . HTN (hypertension)   . Hyperprolactinemia ([place])    dx in her 34, took meds, self d/c a while back  . Liver function test abnormality    normal when repeated  . Mild hyperlipidemia   . Obesity   . Palpitations    negative stress echo in November 2012 with normal LV function; mild LVH, proximal septal thickening with narrow LVOT and mild gradient; mild MR and TR; Cardionet showed PACs in November 2012  . Pyoderma gangrenosa   . Shortness of breath 07/11/2011   MET TEST    Past Surgical History:  Procedure Laterality Date  . SKIN GRAFT  2006   porcine, R leg    Outpatient Medications Prior to Visit  Medication Sig Dispense Refill  . aspirin 81 MG tablet Take 81 mg by mouth daily as needed (takes when out of Toprol). Only taking occas    . furosemide (LASIX) 20 MG tablet TAKE 1 TABLET BY MOUTH TWICE A DAY 180 tablet 2  . Ibuprofen 200 MG CAPS Take 200-800 mg by mouth every 6 (six) hours as needed (headache or pain).     . metoprolol succinate (TOPROL-XL) 50 MG 24 hr tablet Take 1-2 tablets (50-100 mg total) by mouth 2 (two) times daily. 270 tablet 3  . nitroGLYCERIN (NITROSTAT) 0.4 MG SL tablet 1 sublingual every 5 minutes 3 times if chest pain 20 tablet 1  No facility-administered medications prior to visit.      Allergies:   Patient has no known allergies.   Social History   Social History  . Marital status: Single    Spouse name: N/A  . Number of children: 0  . Years of education: N/A   Occupational History  . para Statistician, part time cook    Social History Main Topics  . Smoking status: Never Smoker  . Smokeless tobacco: Never Used  . Alcohol use 3.5 - 7.0 oz/week    7 - 14 drink(s) per week     Comment: wine  . Drug use: No  . Sexual activity: Not Currently   Other Topics Concern  . Not on file   Social History Narrative   Lives by self     Family History:  The  patient's family history includes Coronary artery disease in her unknown relative; Diabetes in her unknown relative; Emphysema in her father; Heart disease in her mother; Ovarian cancer in her mother.   ROS:   Please see the history of present illness.    ROS All other systems reviewed and are negative.   PHYSICAL EXAM:   VS:  BP 130/80   Pulse 60   Ht 5' 4"  (1.626 m)   Wt 197 lb (89.4 kg)   BMI 33.81 kg/m     General: Alert, oriented x3, no distress, mildly obese Head: no evidence of trauma, PERRL, EOMI, no exophtalmos or lid lag, no myxedema, no xanthelasma; normal ears, nose and oropharynx Neck: normal jugular venous pulsations and no hepatojugular reflux; brisk carotid pulses without delay and no carotid bruits Chest: clear to auscultation, no signs of consolidation by percussion or palpation, normal fremitus, symmetrical and full respiratory excursions Cardiovascular: normal position and quality of the apical impulse, regular rhythm, normal first and second heart sounds, grade 3/6 early to mid peaking systolic ejection murmur heard best in the right upper sternal border, less distinct towards the apex, intensifies with the Valsalva maneuver and diminishes with handgrip; no diastolic murmurs, rubs or gallops. She has 2+ pedal edema symmetrically Abdomen: no tenderness or distention, no masses by palpation, no abnormal pulsatility or arterial bruits, normal bowel sounds, no hepatosplenomegaly Extremities: no clubbing, cyanosis; 2+ radial, ulnar and brachial pulses bilaterally; 2+ right femoral, posterior tibial and dorsalis pedis pulses; 2+ left femoral, posterior tibial and dorsalis pedis pulses; no subclavian or femoral bruits Neurological: grossly nonfocal Psych: euthymic mood, full affect  Wt Readings from Last 3 Encounters:  09/23/16 197 lb (89.4 kg)  01/22/16 205 lb (93 kg)  02/20/15 199 lb 3 oz (90.4 kg)      Studies/Labs Reviewed:   EKG:  EKG is ordered today.  It shows  normal sinus rhythm and left ventricular hypertrophy without typical repolarization changes (secondary repol changes have been present intermittently on past ECGs), QTc 434 ms  Recent Labs: No results found for requested labs within last 8760 hours.   Lipid Panel    Component Value Date/Time   CHOL 229 (H) 05/26/2011 0928   TRIG 59.0 05/26/2011 0928   HDL 100.90 05/26/2011 0928   CHOLHDL 2 05/26/2011 0928   VLDL 11.8 05/26/2011 0928   LDLDIRECT 121.8 05/26/2011 0928    ASSESSMENT:    1. HOCM (hypertrophic obstructive cardiomyopathy) ([place])      PLAN:  In order of problems listed above:  1. HOCM: She does not have high risk features such as family history of unexplained sudden death or excessive LVH. She  has had recurrent syncope which places her at increased risk, but both events sounded more consistent with vasovagal or hemodynamic mechanism rather than arrhythmia. When the option was discussed in the past she declined defibrillator implantation. She is on a relatively high beta blocker dose and I don't think her heart rate will permit much increase.  2. CHF: she has symptoms of diastolic heart failure and is hypervolemic. Increase diuretics. She has been taking ibuprofen frequently for arthritis pain. Asked her to try to take acetaminophen as much as possible instead and use NSAIDs very sparingly. Restrict sodium intake. Recommended daily weight monitoring and bring a log to her follow-up appointment. 3. HTN: Good blood pressure control. Use beta blockers preferentially and avoid vasodilators. 4. Syncope: Avoid excessive diuretic use since this may increase the likelihood of both vasovagal syncope and LVOT obstruction.   Medication Adjustments/Labs and Tests Ordered: Current medicines are reviewed at length with the patient today.  Concerns regarding medicines are outlined above.  Medication changes, Labs and Tests ordered today are listed in the Patient Instructions below. Patient  Instructions  Dr Sallyanne Kuster has recommended making the following medication changes: 5. INCREASE Furosemide to 40 mg twice daily until you reach 180 pounds on your home scale. RESUME usual dose thereafter. 6. START Potassium 10 mEq twice daily. STOP when you go back to your usual dose of Furosemide. 7. USE Ibuprofen sparingly!  Your physician recommends that you schedule a follow-up appointment in 2-3 weeks with Kerin Ransom, PA.  Dr Sallyanne Kuster recommends that you schedule a follow-up appointment in 6 months. You will receive a reminder letter in the mail two months in advance. If you don't receive a letter, please call our office to schedule the follow-up appointment.  If you need a refill on your cardiac medications before your next appointment, please call your pharmacy.   Signed, Sanda Klein, MD  09/25/2016 1:04 PM    [facility] [address], [place], [place]  [address] Phone: [phone]; Fax: [phone]

## 2016-09-23 NOTE — Patient Instructions (Signed)
Dr Royann Shiversroitoru has recommended making the following medication changes: 1. INCREASE Furosemide to 40 mg twice daily until you reach 180 pounds on your home scale. RESUME usual dose thereafter. 2. START Potassium 10 mEq twice daily. STOP when you go back to your usual dose of Furosemide. 3. USE Ibuprofen sparingly!  Your physician recommends that you schedule a follow-up appointment in 2-3 weeks with Corine ShelterLuke Kilroy, PA.  Dr Royann Shiversroitoru recommends that you schedule a follow-up appointment in 6 months. You will receive a reminder letter in the mail two months in advance. If you don't receive a letter, please call our office to schedule the follow-up appointment.  If you need a refill on your cardiac medications before your next appointment, please call your pharmacy.

## 2016-09-25 ENCOUNTER — Encounter: Payer: Self-pay | Admitting: Cardiovascular Disease

## 2016-10-11 ENCOUNTER — Ambulatory Visit: Payer: BLUE CROSS/BLUE SHIELD | Admitting: Cardiology

## 2016-12-15 ENCOUNTER — Encounter: Payer: Self-pay | Admitting: *Deleted

## 2016-12-15 ENCOUNTER — Ambulatory Visit: Payer: BLUE CROSS/BLUE SHIELD | Admitting: Cardiology

## 2016-12-15 ENCOUNTER — Encounter: Payer: Self-pay | Admitting: Cardiology

## 2016-12-15 VITALS — BP 126/80 | HR 83 | Ht 64.0 in | Wt 213.0 lb

## 2016-12-15 DIAGNOSIS — I421 Obstructive hypertrophic cardiomyopathy: Secondary | ICD-10-CM

## 2016-12-15 DIAGNOSIS — I5033 Acute on chronic diastolic (congestive) heart failure: Secondary | ICD-10-CM | POA: Diagnosis not present

## 2016-12-15 DIAGNOSIS — Z87898 Personal history of other specified conditions: Secondary | ICD-10-CM

## 2016-12-15 DIAGNOSIS — I5032 Chronic diastolic (congestive) heart failure: Secondary | ICD-10-CM | POA: Insufficient documentation

## 2016-12-15 DIAGNOSIS — I10 Essential (primary) hypertension: Secondary | ICD-10-CM | POA: Diagnosis not present

## 2016-12-15 LAB — CBC
Hematocrit: 38.9 % (ref 34.0–46.6)
Hemoglobin: 12.9 g/dL (ref 11.1–15.9)
MCH: 31.3 pg (ref 26.6–33.0)
MCHC: 33.2 g/dL (ref 31.5–35.7)
MCV: 94 fL (ref 79–97)
Platelets: 265 10*3/uL (ref 150–379)
RBC: 4.12 x10E6/uL (ref 3.77–5.28)
RDW: 14.3 % (ref 12.3–15.4)
WBC: 7 10*3/uL (ref 3.4–10.8)

## 2016-12-15 LAB — BASIC METABOLIC PANEL
BUN/Creatinine Ratio: 23 (ref 12–28)
BUN: 23 mg/dL (ref 8–27)
CO2: 31 mmol/L — ABNORMAL HIGH (ref 20–29)
Calcium: 10.6 mg/dL — ABNORMAL HIGH (ref 8.7–10.3)
Chloride: 103 mmol/L (ref 96–106)
Creatinine, Ser: 1.02 mg/dL — ABNORMAL HIGH (ref 0.57–1.00)
GFR calc Af Amer: 68 mL/min/{1.73_m2} (ref >59)
GFR calc non Af Amer: 59 mL/min/{1.73_m2} — ABNORMAL LOW (ref >59)
Glucose: 121 mg/dL — ABNORMAL HIGH (ref 65–99)
Potassium: 4.2 mmol/L (ref 3.5–5.2)
Sodium: 142 mmol/L (ref 134–144)

## 2016-12-15 LAB — TSH: TSH: 0.854 u[IU]/mL (ref 0.450–4.500)

## 2016-12-15 MED ORDER — METOLAZONE 2.5 MG PO TABS: ORAL_TABLET | ORAL | 2 refills | Status: DC

## 2016-12-15 NOTE — Patient Instructions (Addendum)
Medication Instructions: Your physician recommends that you continue on your current medications as directed. Please refer to the Current Medication list given to you today.  Adding Metolazone 2.5 mg--will give instruction after lab work today. Prescription [facility]   Labwork: Your physician recommends that you return for lab work today: [STAT Bmet], CBC, TSH, BNP  Follow-Up: We have scheduled a follow-up appointment next week with Corine ShelterLuke Kilroy, PA-C next Wednesday, 12/5 at 10 AM.

## 2016-12-15 NOTE — Assessment & Plan Note (Addendum)
Last echo Feb 2017-"HOCM with severe subaortic stenosis".

## 2016-12-15 NOTE — Assessment & Plan Note (Signed)
H/O vaso vagal syncope- avoid volume depletion

## 2016-12-15 NOTE — Assessment & Plan Note (Signed)
Controlled.  

## 2016-12-15 NOTE — Assessment & Plan Note (Signed)
Volume overloaded on exam and by history

## 2016-12-15 NOTE — Progress Notes (Signed)
Labs look good. Be careful w excessively fast diuresis - HOCM gradient and vasovagal events both may worsen if she is intravascularly depleted.  MCr

## 2016-12-15 NOTE — Progress Notes (Addendum)
12/15/2016 Christine Jacobson   01/30/1955  [id number]  Primary Physician Patient, No Pcp Per Primary Cardiologist: Dr Sallyanne Kuster  HPI:  61 y/o female with a history of HOCM, DCHF, HTN, and a h/o vaso vagal syncope. She saw Dr Sallyanne Kuster in Sept 2018 and she was volume overloaded. He increased her lasix to 40 mg BID till her wgt came down to 180 lbs, she was 197 lbs when he saw her. She was to f/u in a week or two with me but couldn't make the appointment. She is in the office today with complaints of increasing DOE and LE edema. She was in [facility] over the [place] and says she had to take a wheel chair in the airport secondary to [place]. She denies orthopnea or syncope. She says she is watching her salt intake. She tells me her wgt at home is 186 lbs but she weighed 213 lbs today in the office.   Current Outpatient Medications  Medication Sig Dispense Refill  . aspirin 81 MG tablet Take 81 mg by mouth daily as needed (takes when out of Toprol). Only taking occas    . furosemide (LASIX) 40 MG tablet Take 40 mg by mouth daily.    . Ibuprofen 200 MG CAPS Take 200-800 mg by mouth every 6 (six) hours as needed (headache or pain).     Marland Kitchen metolazone (ZAROXOLYN) 2.5 MG tablet Take by mouth as directed. 15 tablet 2  . metoprolol succinate (TOPROL-XL) 50 MG 24 hr tablet Take 1-2 tablets (50-100 mg total) by mouth 2 (two) times daily. 270 tablet 3  . nitroGLYCERIN (NITROSTAT) 0.4 MG SL tablet 1 sublingual every 5 minutes 3 times if chest pain 20 tablet 1  . potassium chloride (K-DUR) 10 MEQ tablet Take 1 tablet (10 mEq total) by mouth 2 (two) times daily. (Patient not taking: Reported on 12/15/2016) 180 tablet 3   No current facility-administered medications for this visit.     No Known Allergies  Past Medical History:  Diagnosis Date  . Chest pain 11/22/2010   2D STRESS ECHO - EF 60%, peak stress EF 80%, normal, no evidence for stress-induced ischemia  . HOCM (hypertrophic obstructive cardiomyopathy) ([place])  06/21/2011   2D ECHO - EF >55%, normal  . HTN (hypertension)   . Hyperprolactinemia ([place])    dx in her 62, took meds, self d/c a while back  . Liver function test abnormality    normal when repeated  . Mild hyperlipidemia   . Obesity   . Palpitations    negative stress echo in November 2012 with normal LV function; mild LVH, proximal septal thickening with narrow LVOT and mild gradient; mild MR and TR; Cardionet showed PACs in November 2012  . Pyoderma gangrenosa   . Shortness of breath 07/11/2011   MET TEST    Social History   Socioeconomic History  . Marital status: Single    Spouse name: Not on file  . Number of children: 0  . Years of education: Not on file  . Highest education level: Not on file  Social Needs  . Financial resource strain: Not on file  . Food insecurity - worry: Not on file  . Food insecurity - inability: Not on file  . Transportation needs - medical: Not on file  . Transportation needs - non-medical: Not on file  Occupational History  . Occupation: para Statistician, part time cook  Tobacco Use  . Smoking status: Never Smoker  . Smokeless tobacco: Never Used  Substance and Sexual Activity  . Alcohol use: Yes    Alcohol/week: 3.5 - 7.0 oz    Types: 7 - 14 drink(s) per week    Comment: wine  . Drug use: No  . Sexual activity: Not Currently  Other Topics Concern  . Not on file  Social History Narrative   Lives by self     Family History  Problem Relation Age of Onset  . Heart disease Mother        endocarditis  . Ovarian cancer Mother   . Emphysema Father   . Coronary artery disease Unknown        F in his 34  . Diabetes Unknown        GP  . Cancer Neg Hx      Review of Systems: General: negative for chills, fever, night sweats or weight changes.  Cardiovascular: negative for chest pain, orthopnea, palpitations, paroxysmal nocturnal dyspnea  Dermatological: negative for rash Respiratory: negative for cough or wheezing Urologic:  negative for hematuria Abdominal: negative for nausea, vomiting, diarrhea, bright red blood per rectum, melena, or hematemesis Neurologic: negative for visual changes, syncope, or dizziness All other systems reviewed and are otherwise negative except as noted above.    Blood pressure 126/80, pulse 83, height 5' 4"  (1.626 m), weight 213 lb (96.6 kg), SpO2 97 %.  General appearance: alert, cooperative, no distress and moderately obese Lungs: clear to auscultation bilaterally Heart: regular rate and rhythm and 3/6 systolic murmur AOV, LSB Extremities: 2+ pitting edema Skin: Skin color, texture, turgor normal. No rashes or lesions Neurologic: Grossly normal   ASSESSMENT AND PLAN:   Acute on chronic diastolic (congestive) heart failure (HCC) Volume overloaded on exam and by history  Hypertension Controlled  HOCM (hypertrophic obstructive cardiomyopathy) ([place]) Last echo Feb 2017-"HOCM with severe subaortic stenosis".   History of syncope H/O vaso vagal syncope- avoid volume depletion   PLAN  No recent labs in the computer. She is not taking K+-"I wasn't sure what dose". I would like to add metolazone for a few days but I want to see a BMP first, will order today as STAT. I'll also check a CBC, BNP, and TSH. She'll need close f/u, I'll see her next week on a day Dr Sallyanne Kuster is in the office.   Kerin Ransom PA-C 12/15/2016 9:17 AM   [place]: Her labs looked good including K+. I'll have her take Metolazone 2.5 mg x 3 days, the decrease to twice a weeK.

## 2016-12-16 ENCOUNTER — Other Ambulatory Visit: Payer: Self-pay | Admitting: Cardiovascular Disease

## 2016-12-16 LAB — BRAIN NATRIURETIC PEPTIDE: BNP: 395 pg/mL — ABNORMAL HIGH (ref 0.0–100.0)

## 2016-12-21 ENCOUNTER — Telehealth: Payer: Self-pay | Admitting: Cardiovascular Disease

## 2016-12-21 ENCOUNTER — Ambulatory Visit: Payer: BLUE CROSS/BLUE SHIELD | Admitting: Cardiology

## 2016-12-21 NOTE — Telephone Encounter (Signed)
Did not need this encounter °

## 2016-12-29 ENCOUNTER — Ambulatory Visit: Payer: BLUE CROSS/BLUE SHIELD | Admitting: Cardiovascular Disease

## 2016-12-29 ENCOUNTER — Encounter: Payer: Self-pay | Admitting: Cardiovascular Disease

## 2016-12-29 VITALS — BP 136/82 | HR 72 | Ht 64.0 in | Wt 215.0 lb

## 2016-12-29 DIAGNOSIS — I5033 Acute on chronic diastolic (congestive) heart failure: Secondary | ICD-10-CM | POA: Diagnosis not present

## 2016-12-29 DIAGNOSIS — I421 Obstructive hypertrophic cardiomyopathy: Secondary | ICD-10-CM

## 2016-12-29 DIAGNOSIS — Z87898 Personal history of other specified conditions: Secondary | ICD-10-CM

## 2016-12-29 DIAGNOSIS — R600 Localized edema: Secondary | ICD-10-CM

## 2016-12-29 DIAGNOSIS — I10 Essential (primary) hypertension: Secondary | ICD-10-CM | POA: Diagnosis not present

## 2016-12-29 DIAGNOSIS — R0602 Shortness of breath: Secondary | ICD-10-CM

## 2016-12-29 MED ORDER — FUROSEMIDE 40 MG PO TABS: 40.0000 mg | ORAL_TABLET | Freq: Two times a day (BID) | ORAL | 3 refills | Status: DC

## 2016-12-29 NOTE — Patient Instructions (Signed)
Dr Royann Shiversroitoru has recommended making the following medication changes: 1. INCREASE Furosemide to 40 mg TWICE daily  Your physician recommends that you weigh, daily, at the same time every day, and in the same amount of clothing. Please record your daily weights on the handout provided and bring it to your next appointment.  Your physician recommends that you return for lab work in 2 weeks.  Dr Royann Shiversroitoru recommends that you schedule a follow-up appointment in JANUARY 2019.

## 2016-12-30 NOTE — Progress Notes (Signed)
Patient ID: Christine Jacobson, female   DOB: May 13, 1955, 61 y.o.   MRN: [id number]    Cardiology Office Note    Date:  12/30/2016   ID:  Christine Jacobson, DOB 1955/09/02, MRN [id number]  PCP:  Patient, No Pcp Per  Cardiologist:   Sanda Klein, MD   Chief Complaint  Patient presents with  . Follow-up    discuss lasix dosage. discuss taking potassium    History of Present Illness:  Christine Jacobson is a 61 y.o. female with HOCM and a history of mild hypertension, syncope (probably vasovagal).  Her weight was substantially higher at 213 lb when [place] saw Kerin Ransom in the office on November 29.  We had previously estimated that her dry weight was around 180 pounds, but this was based on her home scale.  Nana says that although our office scale today shows 215 pounds her home scale still shows 185 pounds.  This has caused a lot of confusion.  Luke recommended treatment with metolazone, but Kendall was confused about the recommendations and has not taken any.  Since her office appointment with Lurena Joiner she had an episode of gastroenteritis with chills, nausea and vomiting, diarrhea lasting about 72 hours.  She has also had a lot of emotional stress.  Her brother's wife was diagnosed with metastatic breast cancer and they are very close.  She has not had recent syncope and denies dizziness or lightheadedness.  She continues to have leg edema and NYHA functional class II exertional dyspnea.  The echocardiogram performed February 201 20 7 showed a complex of findings that is highly consistent with hypertrophic obstructive cardiomyopathy including severe basal septal hypertrophy (2.5 cm) and LV outflow tract obstruction with a resting gradient of 64 mmHg increasing to 144 mmHg following the Valsalva maneuver. Mitral regurgitation was mild. The left atrium was severely dilated.  She was very scared when Lurena Joiner mentioned the possible need for surgery for her condition.  We talked about septal ablation using either  open heart surgery or alcohol septal ablation via percutaneous methods.  I am not sure that she needs either one of these procedures performed in the near future, but described them to her to try to relieve some of the concern that she had  Past Medical History:  Diagnosis Date  . Chest pain 11/22/2010   2D STRESS ECHO - EF 60%, peak stress EF 80%, normal, no evidence for stress-induced ischemia  . HOCM (hypertrophic obstructive cardiomyopathy) ([place]) 06/21/2011   2D ECHO - EF >55%, normal  . HTN (hypertension)   . Hyperprolactinemia ([place])    dx in her 65, took meds, self d/c a while back  . Liver function test abnormality    normal when repeated  . Mild hyperlipidemia   . Obesity   . Palpitations    negative stress echo in November 2012 with normal LV function; mild LVH, proximal septal thickening with narrow LVOT and mild gradient; mild MR and TR; Cardionet showed PACs in November 2012  . Pyoderma gangrenosa   . Shortness of breath 07/11/2011   MET TEST    Past Surgical History:  Procedure Laterality Date  . SKIN GRAFT  2006   porcine, R leg    Outpatient Medications Prior to Visit  Medication Sig Dispense Refill  . aspirin 81 MG tablet Take 81 mg by mouth daily as needed (takes when out of Toprol). Only taking occas    . Ibuprofen 200 MG CAPS Take 200-800 mg by mouth every 6 (six) hours as  needed (headache or pain).     Marland Kitchen metolazone (ZAROXOLYN) 2.5 MG tablet Take by mouth as directed. 15 tablet 2  . metoprolol succinate (TOPROL-XL) 50 MG 24 hr tablet Take 1-2 tablets (50-100 mg total) by mouth 2 (two) times daily. 270 tablet 3  . nitroGLYCERIN (NITROSTAT) 0.4 MG SL tablet 1 sublingual every 5 minutes 3 times if chest pain 20 tablet 1  . furosemide (LASIX) 20 MG tablet Take 20 mg by mouth 3 (three) times daily.    . furosemide (LASIX) 20 MG tablet TAKE 1 TABLET BY MOUTH TWICE A DAY 180 tablet 2  . furosemide (LASIX) 40 MG tablet Take 40 mg by mouth daily.    . potassium chloride  (K-DUR) 10 MEQ tablet Take 1 tablet (10 mEq total) by mouth 2 (two) times daily. (Patient not taking: Reported on 12/15/2016) 180 tablet 3   No facility-administered medications prior to visit.      Allergies:   Patient has no known allergies.   Social History   Socioeconomic History  . Marital status: Single    Spouse name: None  . Number of children: 0  . Years of education: None  . Highest education level: None  Social Needs  . Financial resource strain: None  . Food insecurity - worry: None  . Food insecurity - inability: None  . Transportation needs - medical: None  . Transportation needs - non-medical: None  Occupational History  . Occupation: para Statistician, part time cook  Tobacco Use  . Smoking status: Never Smoker  . Smokeless tobacco: Never Used  Substance and Sexual Activity  . Alcohol use: Yes    Alcohol/week: 3.5 - 7.0 oz    Types: 7 - 14 drink(s) per week    Comment: wine  . Drug use: No  . Sexual activity: Not Currently  Other Topics Concern  . None  Social History Narrative   Lives by self     Family History:  The patient's family history includes Coronary artery disease in her unknown relative; Diabetes in her unknown relative; Emphysema in her father; Heart disease in her mother; Ovarian cancer in her mother.   ROS:   Please see the history of present illness.    ROS All other systems reviewed and are negative.   PHYSICAL EXAM:   VS:  BP 136/82   Pulse 72   Ht _0  (1.626 m)   Wt 215 lb (97.5 kg)   BMI 36.90 kg/m      General: Alert, oriented x3, no distress, moderately obes Head: no evidence of trauma, PERRL, EOMI, no exophtalmos or lid lag, no myxedema, no xanthelasma; normal ears, nose and oropharynx Neck: normal jugular venous pulsations and no hepatojugular reflux; brisk carotid pulses without delay and no carotid bruits Chest: clear to auscultation, no signs of consolidation by percussion or palpation, normal fremitus,  symmetrical and full respiratory excursions Cardiovascular: normal position and quality of the apical impulse, regular rhythm, normal first and second heart sounds, no diastolic murmurs, rubs or gallops, grade 3/6 early to mid peaking systolic ejection murmur heard best in the right upper sternal border, less distinct towards the apex, intensifies with the Valsalva maneuver and diminishes with handgrip. She has 2+ pedal edema symmetrically Abdomen: no tenderness or distention, no masses by palpation, no abnormal pulsatility or arterial bruits, normal bowel sounds, no hepatosplenomegaly Extremities: no clubbing, cyanosis or edema; 2+ radial, ulnar and brachial pulses bilaterally; 2+ right femoral, posterior tibial and dorsalis pedis pulses; 2+  left femoral, posterior tibial and dorsalis pedis pulses; no subclavian or femoral bruits Neurological: grossly nonfocal Psych: Normal mood and affect   Wt Readings from Last 3 Encounters:  12/29/16 215 lb (97.5 kg)  12/15/16 213 lb (96.6 kg)  09/23/16 197 lb (89.4 kg)      Studies/Labs Reviewed:   EKG:  EKG is not ordered today.  Recent Labs: 12/15/2016: BNP 395.0; BUN 23; Creatinine, Ser 1.02; Hemoglobin 12.9; Platelets 265; Potassium 4.2; Sodium 142; TSH 0.854   Lipid Panel    Component Value Date/Time   CHOL 229 (H) 05/26/2011 0928   TRIG 59.0 05/26/2011 0928   HDL 100.90 05/26/2011 0928   CHOLHDL 2 05/26/2011 0928   VLDL 11.8 05/26/2011 0928   LDLDIRECT 121.8 05/26/2011 0928    ASSESSMENT:    1. Shortness of breath   2. Obstructive hypertrophic cardiomyopathy ([place])   3. Acute on chronic diastolic heart failure ([place])   4. Essential hypertension   5. History of syncope   6. Lower extremity edema      PLAN:  In order of problems listed above:  1. HOCM: She does not have high risk features such as family history of unexplained sudden death or excessive LVH. She has had recurrent syncope which places her at increased risk, but both  events sounded more consistent with vasovagal or hemodynamic mechanism rather than arrhythmia. When the option was discussed in the past she declined defibrillator implantation.  I am actually pleased that she did not take the metolazone, since his sudden aggressive diuresis puts her at risk for worsening outflow tract obstruction and syncope.  May choose to repeat her echocardiogram in a couple of weeks.  May benefit from even higher doses of beta-blockers, but in the past these have been limited by bradycardia.  Heart rate is unusually fast for her today.  We briefly reviewed the possible need for septal ablation in the future, but I think it is premature to discuss this. 2. CHF: she has symptoms of diastolic heart failure and is hypervolemic.  The change in her weight is truly puzzling and I can only explain it by assuming that she has gained true weight.  I am not sure how to reconcile the big difference between her home scale and our office scale.  Increase loop diuretics.  Restrict sodium intake. Recommended daily weight monitoring and bring a log to her follow-up appointment. 3. HTN: Good blood pressure control. Use beta blockers preferentially and avoid vasodilators. 4. Syncope: Avoid excessively rapid diuresis since this may increase the likelihood of both vasovagal syncope and LVOT obstruction.   Medication Adjustments/Labs and Tests Ordered: Current medicines are reviewed at length with the patient today.  Concerns regarding medicines are outlined above.  Medication changes, Labs and Tests ordered today are listed in the Patient Instructions below. Patient Instructions  Dr Sallyanne Kuster has recommended making the following medication changes: 1. INCREASE Furosemide to 40 mg TWICE daily  Your physician recommends that you weigh, daily, at the same time every day, and in the same amount of clothing. Please record your daily weights on the handout provided and bring it to your next  appointment.  Your physician recommends that you return for lab work in 2 weeks.  Dr Sallyanne Kuster recommends that you schedule a follow-up appointment in JANUARY 2019.   Signed, Sanda Klein, MD  12/30/2016 6:59 PM    [facility] [place], [place], [place]  [address] Phone: [phone]; Fax: [phone]

## 2017-01-16 ENCOUNTER — Other Ambulatory Visit: Payer: Self-pay | Admitting: Cardiovascular Disease

## 2017-01-16 DIAGNOSIS — E669 Obesity, unspecified: Secondary | ICD-10-CM | POA: Insufficient documentation

## 2017-01-16 DIAGNOSIS — I10 Essential (primary) hypertension: Secondary | ICD-10-CM | POA: Insufficient documentation

## 2017-01-16 DIAGNOSIS — R7989 Other specified abnormal findings of blood chemistry: Secondary | ICD-10-CM | POA: Insufficient documentation

## 2017-01-16 DIAGNOSIS — R945 Abnormal results of liver function studies: Secondary | ICD-10-CM | POA: Insufficient documentation

## 2017-02-09 ENCOUNTER — Ambulatory Visit: Payer: BLUE CROSS/BLUE SHIELD | Admitting: Cardiovascular Disease

## 2017-02-09 ENCOUNTER — Encounter: Payer: Self-pay | Admitting: Cardiovascular Disease

## 2017-02-09 VITALS — BP 150/82 | HR 52 | Ht 64.0 in | Wt 214.0 lb

## 2017-02-09 DIAGNOSIS — I10 Essential (primary) hypertension: Secondary | ICD-10-CM | POA: Diagnosis not present

## 2017-02-09 DIAGNOSIS — Z87898 Personal history of other specified conditions: Secondary | ICD-10-CM | POA: Diagnosis not present

## 2017-02-09 DIAGNOSIS — I5032 Chronic diastolic (congestive) heart failure: Secondary | ICD-10-CM | POA: Diagnosis not present

## 2017-02-09 DIAGNOSIS — I421 Obstructive hypertrophic cardiomyopathy: Secondary | ICD-10-CM | POA: Diagnosis not present

## 2017-02-09 NOTE — Patient Instructions (Signed)
Medication Instructions: Your physician recommends that you continue on your current medications as directed. Please refer to the Current Medication list given to you today.   Follow-Up: Your physician wants you to follow-up in: 6 months with Dr. [name]. You will receive a reminder letter in the mail two months in advance. If you don't receive a letter, please call our office to schedule the follow-up appointment.  If you need a refill on your cardiac medications before your next appointment, please call your pharmacy. 

## 2017-02-09 NOTE — Progress Notes (Signed)
Patient ID: Christine Jacobson, female   DOB: 1955/01/21, 62 y.o.   MRN: [id number]    Cardiology Office Note    Date:  02/10/2017   ID:  Christine Jacobson, DOB 17-Dec-1955, MRN [id number]  PCP:  Patient, No Pcp Per  Cardiologist:   Sanda Klein, MD   Chief Complaint  Patient presents with  . 1 month f/u visit    pt states she has not stayed on her diet; no other problems or Sx.    History of Present Illness:  Christine Jacobson is a 62 y.o. female with HOCM and a history of mild hypertension, syncope (probably vasovagal).  She feels much better than she did at her last appointment, even though she weighs just as much and her feet looks just as edematous.  She reports losing about 6 pounds of weight but then eating some Doritos due to stress at work and regaining the fluid.  Nevertheless she is able to walk vigorously and only has NYHA functional class II shortness of breath.  She no longer has any difficulty walking her dog.  She denies syncope, dizziness or lightheadedness or palpitations.  She does not have chest pain.  The echocardiogram performed February 201 20 7 showed a complex of findings that is highly consistent with hypertrophic obstructive cardiomyopathy including severe basal septal hypertrophy (2.5 cm) and LV outflow tract obstruction with a resting gradient of 64 mmHg increasing to 144 mmHg following the Valsalva maneuver. Mitral regurgitation was mild. The left atrium was severely dilated.  Past Medical History:  Diagnosis Date  . Chest pain 11/22/2010   2D STRESS ECHO - EF 60%, peak stress EF 80%, normal, no evidence for stress-induced ischemia  . HOCM (hypertrophic obstructive cardiomyopathy) ([place]) 06/21/2011   2D ECHO - EF >55%, normal  . HTN (hypertension)   . Hyperprolactinemia ([place])    dx in her 58, took meds, self d/c a while back  . Liver function test abnormality    normal when repeated  . Mild hyperlipidemia   . Obesity   . Palpitations    negative stress echo in November  2012 with normal LV function; mild LVH, proximal septal thickening with narrow LVOT and mild gradient; mild MR and TR; Cardionet showed PACs in November 2012  . Pyoderma gangrenosa   . Shortness of breath 07/11/2011   MET TEST    Past Surgical History:  Procedure Laterality Date  . SKIN GRAFT  2006   porcine, R leg    Outpatient Medications Prior to Visit  Medication Sig Dispense Refill  . aspirin 81 MG tablet Take 81 mg by mouth daily as needed (takes when out of Toprol). Only taking occas    . furosemide (LASIX) 40 MG tablet Take 1 tablet (40 mg total) by mouth 2 (two) times daily. 180 tablet 3  . Ibuprofen 200 MG CAPS Take 200-800 mg by mouth every 6 (six) hours as needed (headache or pain).     Marland Kitchen metolazone (ZAROXOLYN) 2.5 MG tablet Take by mouth as directed. 15 tablet 2  . metoprolol succinate (TOPROL-XL) 50 MG 24 hr tablet TAKE 1-2 TABLETS (50-100 MG TOTAL) BY MOUTH 2 (TWO) TIMES DAILY. 270 tablet 3  . nitroGLYCERIN (NITROSTAT) 0.4 MG SL tablet 1 sublingual every 5 minutes 3 times if chest pain 20 tablet 1   No facility-administered medications prior to visit.      Allergies:   Patient has no known allergies.   Social History   Socioeconomic History  . Marital status: Single  Spouse name: None  . Number of children: 0  . Years of education: None  . Highest education level: None  Social Needs  . Financial resource strain: None  . Food insecurity - worry: None  . Food insecurity - inability: None  . Transportation needs - medical: None  . Transportation needs - non-medical: None  Occupational History  . Occupation: para Statistician, part time cook  Tobacco Use  . Smoking status: Never Smoker  . Smokeless tobacco: Never Used  Substance and Sexual Activity  . Alcohol use: Yes    Alcohol/week: 3.5 - 7.0 oz    Types: 7 - 14 drink(s) per week    Comment: wine  . Drug use: No  . Sexual activity: Not Currently  Other Topics Concern  . None  Social History  Narrative   Lives by self     Family History:  The patient's family history includes Coronary artery disease in her unknown relative; Diabetes in her unknown relative; Emphysema in her father; Heart disease in her mother; Ovarian cancer in her mother.   ROS:   Please see the history of present illness.    ROS All other systems reviewed and are negative.   PHYSICAL EXAM:   VS:  BP (!) 150/82 (BP Location: Left Arm, Patient Position: Sitting, Cuff Size: Large)   Pulse (!) 52   Ht _0  (1.626 m)   Wt 214 lb (97.1 kg)   BMI 36.73 kg/m     General: Alert, oriented x3, no distress, moderately obese Head: no evidence of trauma, PERRL, EOMI, no exophtalmos or lid lag, no myxedema, no xanthelasma; normal ears, nose and oropharynx Neck: normal jugular venous pulsations and no hepatojugular reflux; brisk carotid pulses without delay and no carotid bruits Chest: clear to auscultation, no signs of consolidation by percussion or palpation, normal fremitus, symmetrical and full respiratory excursions Cardiovascular: normal position and quality of the apical impulse, regular rhythm, normal first and second heart sounds, no diastolic murmurs, rubs or gallops, grade 3/6 early to mid peaking systolic ejection murmur heard best in the right upper sternal border, less distinct towards the apex, intensifies with the Valsalva maneuver and diminishes with handgrip. She has 2+ pedal edema symmetrically Abdomen: no tenderness or distention, no masses by palpation, no abnormal pulsatility or arterial bruits, normal bowel sounds, no hepatosplenomegaly Extremities: no clubbing, cyanosis; symmetrical 2+ pedal edema; 2+ radial, ulnar and brachial pulses bilaterally; 2+ right femoral, posterior tibial and dorsalis pedis pulses; 2+ left femoral, posterior tibial and dorsalis pedis pulses; no subclavian or femoral bruits Neurological: grossly nonfocal Psych: Normal mood and affect    Wt Readings from Last 3  Encounters:  02/09/17 214 lb (97.1 kg)  12/29/16 215 lb (97.5 kg)  12/15/16 213 lb (96.6 kg)      Studies/Labs Reviewed:   EKG:  EKG is ordered today.  It shows sinus bradycardia with a rate of 52 bpm, left atrial abnormality and left ventricular hypertrophy, QTC 429 ms  Recent Labs: 12/15/2016: BNP 395.0; BUN 23; Creatinine, Ser 1.02; Hemoglobin 12.9; Platelets 265; Potassium 4.2; Sodium 142; TSH 0.854   Lipid Panel    Component Value Date/Time   CHOL 229 (H) 05/26/2011 0928   TRIG 59.0 05/26/2011 0928   HDL 100.90 05/26/2011 0928   CHOLHDL 2 05/26/2011 0928   VLDL 11.8 05/26/2011 0928   LDLDIRECT 121.8 05/26/2011 0928    ASSESSMENT:    1. Obstructive hypertrophic cardiomyopathy ([place])   2. Chronic diastolic heart failure ([place])  3. Essential hypertension   4. History of syncope      PLAN:  In order of problems listed above:  1. HOCM: She does not have high risk features such as family history of unexplained sudden death or excessive LVH. She has had recurrent syncope which places her at increased risk, but both events sounded more consistent with vasovagal or hemodynamic mechanism rather than arrhythmia. When the option was discussed in the past she declined defibrillator implantation.  Bradycardia limits additional doses of beta-blocker.  I am glad that we did not increase her dose of beta-blocker at the last appointment, since today her heart rate is only 52 bpm.  I wonder if she may have missed some doses of beta-blocker last time and that is why she was sicker and had a faster heartbeat.  It is quite possible that in the future she may require septal myectomy or alcohol septal ablation, as longer as her symptoms are relatively well controlled as they are today, can defer aggressive procedures. 2. CHF: She appears mildly hypervolemic today but is not short of breath.  Avoid excessive diuresis since this may precipitate syncope and worsening outflow tract obstruction.   T. 3. HTN: Blood pressure was high when she first checked in, when we rechecked her blood pressure was 126/70 mmHg.  No changes made to her medications 4. Syncope: She has had 2 previous episodes of syncope , but both events sounded more consistent with vasovagal or hemodynamic mechanism rather than arrhythmia.Avoid excessively rapid diuresis since this may increase the likelihood of both vasovagal syncope and LVOT obstruction.   Medication Adjustments/Labs and Tests Ordered: Current medicines are reviewed at length with the patient today.  Concerns regarding medicines are outlined above.  Medication changes, Labs and Tests ordered today are listed in the Patient Instructions below. Patient Instructions  Medication Instructions: Your physician recommends that you continue on your current medications as directed. Please refer to the Current Medication list given to you today.   Follow-Up: Your physician wants you to follow-up in: 6 months with Dr. Sallyanne Kuster. You will receive a reminder letter in the mail two months in advance. If you don't receive a letter, please call our office to schedule the follow-up appointment.  If you need a refill on your cardiac medications before your next appointment, please call your pharmacy.    Signed, Sanda Klein, MD  02/10/2017 6:34 PM    [facility] [address], [place], [place]  [address] Phone: [phone]; Fax: [phone]

## 2017-07-04 ENCOUNTER — Telehealth: Payer: Self-pay | Admitting: Cardiovascular Disease

## 2017-07-04 NOTE — Telephone Encounter (Signed)
If it persists I would check a PA and lateral CXR MCr

## 2017-07-04 NOTE — Telephone Encounter (Signed)
Returned call to patient, patient reports she has had a harsh cough x 1 week and she is unsure why.  She states it is not productive, but "something wants to come up".  She has had edema in her feet but states this has improved.   Only endorses SOB with coughing.   Does not weigh herself as her scales are broken.   Patient states she is improving and would like to just call back if it doesn't continue to improve.   Advised to monitor salt intake, weigh if possible, monitor for increase in swelling, SOB.   Patient agreed and will call back in about a week per her request.   Advised I would make Dr. Royann Shiversroitoru aware.

## 2017-07-04 NOTE — Telephone Encounter (Signed)
New Message   Patient is calling because she is having a harsh cough and its leaving her heaving. She feels like she is choking and its becoming concerning. Please call.

## 2017-08-09 NOTE — Progress Notes (Signed)
patient ID: Christine Jacobson, female   DOB: 1955/05/09, 62 y.o.   MRN: [id number]    Cardiology Office Note    Date:  08/10/2017   ID:  Christine Jacobson, DOB September 20, 1955, MRN [id number]  PCP:  Patient, No Pcp Per  Cardiologist:   Christine Klein, MD   Chief Complaint  Patient presents with  . 6 month f/u visit    pt c/o SOB and difficulty walking; stress; states she has been going to the gym twice a week and trying to watch her weight; wondering about having an echo    History of Present Illness:  Christine Jacobson is a 62 y.o. female with HOCM and a history of mild hypertension, syncope (probably vasovagal).  She is generally doing well although she is under a lot of stress at work.  She was also having chronic financial problems and will have to restart a part-time job at [employer].  These problems are bigger issue right now than any physical complaints.  She denies any issues with syncope or dizziness.  She does have exertional dyspnea NYHA functional class II (1 flight of stairs).  She tries to exercise at least twice a week.  She walks her dog, Cookie.  She continues to have issues with pedal edema.  She has found that she does best when she takes the metoprolol 3 times a day and the furosemide also 3 times a day.  She has not taken any metolazone.  Palpitations occur when she is upset and are not associated with dizziness or near syncope.  The echocardiogram performed March 05, 2015 showed a complex of findings that is highly consistent with hypertrophic obstructive cardiomyopathy including severe basal septal hypertrophy (2.5 cm) and LV outflow tract obstruction with a resting gradient of 64 mmHg increasing to 144 mmHg following the Valsalva maneuver. Mitral regurgitation was mild. The left atrium was severely dilated.  Past Medical History:  Diagnosis Date  . Chest pain 11/22/2010   2D STRESS ECHO - EF 60%, peak stress EF 80%, normal, no evidence for stress-induced ischemia  . HOCM (hypertrophic  obstructive cardiomyopathy) ([place]) 06/21/2011   2D ECHO - EF >55%, normal  . HTN (hypertension)   . Hyperprolactinemia ([place])    dx in her 39, took meds, self d/c a while back  . Liver function test abnormality    normal when repeated  . Mild hyperlipidemia   . Obesity   . Palpitations    negative stress echo in November 2012 with normal LV function; mild LVH, proximal septal thickening with narrow LVOT and mild gradient; mild MR and TR; Cardionet showed PACs in November 2012  . Pyoderma gangrenosa   . Shortness of breath 07/11/2011   MET TEST    Past Surgical History:  Procedure Laterality Date  . SKIN GRAFT  2006   porcine, R leg    Outpatient Medications Prior to Visit  Medication Sig Dispense Refill  . aspirin 81 MG tablet Take 81 mg by mouth daily as needed (takes when out of Toprol). Only taking occas    . Ibuprofen 200 MG CAPS Take 200-800 mg by mouth every 6 (six) hours as needed (headache or pain).     Marland Kitchen metolazone (ZAROXOLYN) 2.5 MG tablet Take by mouth as directed. 15 tablet 2  . nitroGLYCERIN (NITROSTAT) 0.4 MG SL tablet 1 sublingual every 5 minutes 3 times if chest pain 20 tablet 1  . furosemide (LASIX) 40 MG tablet Take 1 tablet (40 mg total) by mouth 2 (two) times  daily. 180 tablet 3  . metoprolol succinate (TOPROL-XL) 50 MG 24 hr tablet TAKE 1-2 TABLETS (50-100 MG TOTAL) BY MOUTH 2 (TWO) TIMES DAILY. 270 tablet 3   No facility-administered medications prior to visit.      Allergies:   Patient has no known allergies.   Social History   Socioeconomic History  . Marital status: Single    Spouse name: Not on file  . Number of children: 0  . Years of education: Not on file  . Highest education level: Not on file  Occupational History  . Occupation: para Statistician, part time [place]  . Financial resource strain: Not on file  . Food insecurity:    Worry: Not on file    Inability: Not on file  . Transportation needs:    Medical: Not on file     Non-medical: Not on file  Tobacco Use  . Smoking status: Never Smoker  . Smokeless tobacco: Never Used  Substance and Sexual Activity  . Alcohol use: Yes    Alcohol/week: 4.2 - 8.4 oz    Types: 7 - 14 drink(s) per week    Comment: wine  . Drug use: No  . Sexual activity: Not Currently  Lifestyle  . Physical activity:    Days per week: Not on file    Minutes per session: Not on file  . Stress: Not on file  Relationships  . Social connections:    Talks on phone: Not on file    Gets together: Not on file    Attends religious service: Not on file    Active member of club or organization: Not on file    Attends meetings of clubs or organizations: Not on file    Relationship status: Not on file  Other Topics Concern  . Not on file  Social History Narrative   Lives by self     Family History:  The patient's family history includes Coronary artery disease in her unknown relative; Diabetes in her unknown relative; Emphysema in her father; Heart disease in her mother; Ovarian cancer in her mother.   ROS:   Please see the history of present illness.    ROS All other systems reviewed and are negative.   PHYSICAL EXAM:   VS:  BP 130/82   Pulse 68   Ht 5' 4" (1.626 m)   Wt 214 lb (97.1 kg)   BMI 36.73 kg/m     General: Alert, oriented x3, no distress, obese Head: no evidence of trauma, PERRL, EOMI, no exophtalmos or lid lag, no myxedema, no xanthelasma; normal ears, nose and oropharynx Neck: normal jugular venous pulsations and no hepatojugular reflux; brisk carotid pulses without delay and no carotid bruits Chest: clear to auscultation, no signs of consolidation by percussion or palpation, normal fremitus, symmetrical and full respiratory excursions Cardiovascular: normal position and quality of the apical impulse, regular rhythm, normal first and second heart sounds, grade 3/6  mid peaking systolic ejection murmur heard best in the right upper sternal border, less distinct  towards the apex, intensifies with the Valsalva maneuver and diminishes with handgrip. She has 1 + pedal edema symmetrically Abdomen: no tenderness or distention, no masses by palpation, no abnormal pulsatility or arterial bruits, normal bowel sounds, no hepatosplenomegaly Extremities: no clubbing, cyanosis or edema; 2+ radial, ulnar and brachial pulses bilaterally; 2+ right femoral, posterior tibial and dorsalis pedis pulses; 2+ left femoral, posterior tibial and dorsalis pedis pulses; no subclavian or femoral bruits Neurological: grossly  nonfocal Psych: Normal mood and affect    Wt Readings from Last 3 Encounters:  08/10/17 214 lb (97.1 kg)  02/09/17 214 lb (97.1 kg)  12/29/16 215 lb (97.5 kg)      Studies/Labs Reviewed:   EKG:  EKG is ordered today.  It shows sinus bradycardia, left atrial abnormality, left ventricular hypertrophy, normal QT Recent Labs: 12/15/2016: BNP 395.0; BUN 23; Creatinine, Ser 1.02; Hemoglobin 12.9; Platelets 265; Potassium 4.2; Sodium 142; TSH 0.854   Lipid Panel    Component Value Date/Time   CHOL 229 (H) 05/26/2011 0928   TRIG 59.0 05/26/2011 0928   HDL 100.90 05/26/2011 0928   CHOLHDL 2 05/26/2011 0928   VLDL 11.8 05/26/2011 0928   LDLDIRECT 121.8 05/26/2011 0928    ASSESSMENT:    1. HOCM (hypertrophic obstructive cardiomyopathy) ([place])   2. Chronic diastolic (congestive) heart failure ([place])   3. Essential hypertension   4. History of syncope      PLAN:  In order of problems listed above:  1. HOCM: She does not have high risk features such as family history of unexplained sudden death or excessive LVH. She has had 2 previous episodes of syncope which places her at increased risk, but both events sounded more consistent with vasovagal or hemodynamic mechanism rather than arrhythmia. When the option was discussed in the past she declined defibrillator implantation.  She has self increase her dose of beta-blocker and is tolerating it well.    It  is quite possible that in the future she may require septal myectomy or alcohol septal ablation, as long as her symptoms are relatively well controlled (as they are today), can defer aggressive procedures.  She should avoid heat and excessive physical exertion.  Her murmur is quite loud today.  Would like to repeat her echocardiogram to reassess the outflow tract gradient and the degree of mitral insufficiency. 2. CHF: She has mild pedal edema but no other findings to suggest hypervolemia.  Avoid excessive diuresis since this may precipitate syncope and worsening outflow tract obstruction.   3. HTN: Well-controlled.  Her pattern of LVH is more consistent with genetic hypertrophic cardiomyopathy than it is with secondary hypertrophy. 4. Syncope: She has had 2 previous episodes of syncope , but both events sounded more consistent with vasovagal or hemodynamic mechanism rather than arrhythmia.Avoid excessively rapid diuresis since this may increase the likelihood of both vasovagal syncope and LVOT obstruction.   Medication Adjustments/Labs and Tests Ordered: Current medicines are reviewed at length with the patient today.  Concerns regarding medicines are outlined above.  Medication changes, Labs and Tests ordered today are listed in the Patient Instructions below. Patient Instructions  Medication Instructions:  Your physician has recommended you make the following change in your medication:  1) INCREASE Lasix to 40 mg tablet by mouth THREE times daily 2) INCREASE Toprol to 50 mg tablet by mouth THREE times daily    Labwork: none  Testing/Procedures: Your physician has requested that you have an echocardiogram. Echocardiography is a painless test that uses sound waves to create images of your heart. It provides your doctor with information about the size and shape of your heart and how well your heart's chambers and valves are working. This procedure takes approximately one hour. There are no  restrictions for this procedure.    Follow-Up: Your physician wants you to follow-up in: 6 months with Dr. Sallyanne Kuster. You will receive a reminder letter in the mail two months in advance. If you don't receive a letter,  please call our office to schedule the follow-up appointment.   Any Other Special Instructions Will Be Listed Below (If Applicable).     If you need a refill on your cardiac medications before your next appointment, please call your pharmacy.     Signed, Christine Klein, MD  08/10/2017 2:17 PM    [facility] [address], [place], [place]  [address] Phone: [phone]; Fax: [phone]

## 2017-08-10 ENCOUNTER — Encounter: Payer: Self-pay | Admitting: Cardiovascular Disease

## 2017-08-10 ENCOUNTER — Ambulatory Visit: Payer: BLUE CROSS/BLUE SHIELD | Admitting: Cardiovascular Disease

## 2017-08-10 VITALS — BP 130/82 | HR 68 | Ht 64.0 in | Wt 214.0 lb

## 2017-08-10 DIAGNOSIS — I5032 Chronic diastolic (congestive) heart failure: Secondary | ICD-10-CM | POA: Diagnosis not present

## 2017-08-10 DIAGNOSIS — I10 Essential (primary) hypertension: Secondary | ICD-10-CM

## 2017-08-10 DIAGNOSIS — I421 Obstructive hypertrophic cardiomyopathy: Secondary | ICD-10-CM

## 2017-08-10 DIAGNOSIS — Z87898 Personal history of other specified conditions: Secondary | ICD-10-CM

## 2017-08-10 MED ORDER — METOPROLOL SUCCINATE ER 50 MG PO TB24: 50.0000 mg | ORAL_TABLET | Freq: Three times a day (TID) | ORAL | 3 refills | Status: DC

## 2017-08-10 MED ORDER — FUROSEMIDE 40 MG PO TABS: 40.0000 mg | ORAL_TABLET | Freq: Three times a day (TID) | ORAL | 3 refills | Status: DC

## 2017-08-10 NOTE — Patient Instructions (Signed)
Medication Instructions:  Your physician has recommended you make the following change in your medication:  1) INCREASE Lasix to 40 mg tablet by mouth THREE times daily 2) INCREASE Toprol to 50 mg tablet by mouth THREE times daily    Labwork: none  Testing/Procedures: Your physician has requested that you have an echocardiogram. Echocardiography is a painless test that uses sound waves to create images of your heart. It provides your doctor with information about the size and shape of your heart and how well your heart's chambers and valves are working. This procedure takes approximately one hour. There are no restrictions for this procedure.    Follow-Up: Your physician wants you to follow-up in: 6 months with Dr. Royann Shiversroitoru. You will receive a reminder letter in the mail two months in advance. If you don't receive a letter, please call our office to schedule the follow-up appointment.   Any Other Special Instructions Will Be Listed Below (If Applicable).     If you need a refill on your cardiac medications before your next appointment, please call your pharmacy.

## 2017-08-16 ENCOUNTER — Ambulatory Visit (HOSPITAL_COMMUNITY): Payer: BLUE CROSS/BLUE SHIELD | Attending: Cardiovascular Disease

## 2017-08-16 ENCOUNTER — Other Ambulatory Visit: Payer: Self-pay

## 2017-08-16 DIAGNOSIS — Z87898 Personal history of other specified conditions: Secondary | ICD-10-CM

## 2017-08-16 DIAGNOSIS — I340 Nonrheumatic mitral (valve) insufficiency: Secondary | ICD-10-CM | POA: Insufficient documentation

## 2017-08-16 DIAGNOSIS — I5032 Chronic diastolic (congestive) heart failure: Secondary | ICD-10-CM

## 2017-08-16 DIAGNOSIS — I10 Essential (primary) hypertension: Secondary | ICD-10-CM

## 2017-08-16 DIAGNOSIS — I421 Obstructive hypertrophic cardiomyopathy: Secondary | ICD-10-CM

## 2017-08-16 DIAGNOSIS — I110 Hypertensive heart disease with heart failure: Secondary | ICD-10-CM | POA: Insufficient documentation

## 2017-09-20 ENCOUNTER — Ambulatory Visit (INDEPENDENT_AMBULATORY_CARE_PROVIDER_SITE_OTHER): Payer: Self-pay

## 2017-09-20 ENCOUNTER — Encounter (INDEPENDENT_AMBULATORY_CARE_PROVIDER_SITE_OTHER): Payer: Self-pay | Admitting: Family

## 2017-09-20 ENCOUNTER — Ambulatory Visit (INDEPENDENT_AMBULATORY_CARE_PROVIDER_SITE_OTHER): Payer: BLUE CROSS/BLUE SHIELD | Admitting: Family

## 2017-09-20 VITALS — Ht 64.0 in | Wt 214.0 lb

## 2017-09-20 DIAGNOSIS — M25551 Pain in right hip: Secondary | ICD-10-CM | POA: Diagnosis not present

## 2017-09-20 DIAGNOSIS — M5416 Radiculopathy, lumbar region: Secondary | ICD-10-CM | POA: Diagnosis not present

## 2017-09-20 MED ORDER — PREDNISONE 50 MG PO TABS: ORAL_TABLET | ORAL | 0 refills | Status: DC

## 2017-09-20 NOTE — Progress Notes (Signed)
Office Visit Note   Patient: Christine Jacobson           Date of Birth: [birth year]           MRN: [id number] Visit Date: 09/20/2017              Requested by: No referring provider defined for this encounter. PCP: Patient, No Pcp Per  Chief Complaint  Patient presents with  . Right Hip - Pain    X 1 month NKI      HPI: The patient is a 62 year old woman who presents today complaining of right hip pain.  This is been ongoing for about a month.  She has had no known injury.  Describes the pain as shooting and radiating this is in her posterior right buttocks and down her lateral right hip and into her groin.  Describes some associated heaviness of the right leg.  Has not had any weakness.  Some tingling in the anterior thigh and groin.  Denies any low back pain  Assessment & Plan: Visit Diagnoses:  1. Pain in right hip     Plan: We will trial a prednisone course.  Feel that this is lumbar radiculopathy. May follow-up in the office in 4 weeks time if no improvement.  May need to consider MRI.  Follow-Up Instructions: No follow-ups on file.   Right Hip Exam   Tenderness  The patient is experiencing no tenderness.   Range of Motion  The patient has normal right hip ROM.  Muscle Strength  The patient has normal right hip strength.  Tests  FABER: negative   Back Exam   Range of Motion  The patient has normal back ROM.  Muscle Strength  The patient has normal back strength.  Tests  Straight leg raise right: negative Straight leg raise left: negative  Other  Gait: normal       Patient is alert, oriented, no adenopathy, well-dressed, normal affect, normal respiratory effort.   Imaging: No results found. No images are attached to the encounter.  Labs: No results found for: HGBA1C, ESRSEDRATE, CRP, LABURIC, REPTSTATUS, GRAMSTAIN, CULT, LABORGA   Lab Results  Component Value Date   ALBUMIN 3.8 05/26/2011   ALBUMIN 4.1 09/23/2010    Body mass index  is 36.73 kg/m.  Orders:  Orders Placed This Encounter  Procedures  . XR HIP UNILAT W OR W/O PELVIS 2-3 VIEWS RIGHT  . XR Lumbar Spine 2-3 Views   Meds ordered this encounter  Medications  . predniSONE (DELTASONE) 50 MG tablet    Sig: Take one tablet daily x 5 days    Dispense:  5 tablet    Refill:  0     Procedures: No procedures performed  Clinical Data: No additional findings.  ROS:  All other systems negative, except as noted in the HPI. Review of Systems  Constitutional: Negative for chills and fever.  Musculoskeletal: Positive for arthralgias and myalgias.  Neurological: Negative for weakness.    Objective: Vital Signs: Ht 5' 4" (1.626 m)   Wt 214 lb (97.1 kg)   BMI 36.73 kg/m   Specialty Comments:  No specialty comments available.  PMFS History: Patient Active Problem List   Diagnosis Date Noted  . Obesity   . Liver function test abnormality   . HTN (hypertension)   . Acute on chronic diastolic (congestive) heart failure ([place]) 12/15/2016  . HOCM (hypertrophic obstructive cardiomyopathy) ([place]) 01/22/2016  . Lower extremity edema 09/17/2014  . History of syncope 12/05/2013  .  Pneumonia 11/23/2012  . Shortness of breath 07/11/2011  . Hypertension 11/23/2010  . Chest pain 10/18/2010  . Murmur 10/18/2010  . Palpitations 09/23/2010  . Mild hyperlipidemia   . Elevated BP   . Hyperprolactinemia ([place])   . Pyoderma gangrenosa    Past Medical History:  Diagnosis Date  . Chest pain 11/22/2010   2D STRESS ECHO - EF 60%, peak stress EF 80%, normal, no evidence for stress-induced ischemia  . HOCM (hypertrophic obstructive cardiomyopathy) ([place]) 06/21/2011   2D ECHO - EF >55%, normal  . HTN (hypertension)   . Hyperprolactinemia ([place])    dx in her 63, took meds, self d/c a while back  . Liver function test abnormality    normal when repeated  . Mild hyperlipidemia   . Obesity   . Palpitations    negative stress echo in November 2012 with normal LV  function; mild LVH, proximal septal thickening with narrow LVOT and mild gradient; mild MR and TR; Cardionet showed PACs in November 2012  . Pyoderma gangrenosa   . Shortness of breath 07/11/2011   MET TEST    Family History  Problem Relation Age of Onset  . Heart disease Mother        endocarditis  . Ovarian cancer Mother   . Emphysema Father   . Coronary artery disease Unknown        F in his 3  . Diabetes Unknown        GP  . Cancer Neg Hx     Past Surgical History:  Procedure Laterality Date  . SKIN GRAFT  2006   porcine, R leg   Social History   Occupational History  . Occupation: para Statistician, part time cook  Tobacco Use  . Smoking status: Never Smoker  . Smokeless tobacco: Never Used  Substance and Sexual Activity  . Alcohol use: Yes    Alcohol/week: 7.0 - 14.0 standard drinks    Types: 7 - 14 drink(s) per week    Comment: wine  . Drug use: No  . Sexual activity: Not Currently

## 2017-09-23 ENCOUNTER — Other Ambulatory Visit (INDEPENDENT_AMBULATORY_CARE_PROVIDER_SITE_OTHER): Payer: Self-pay | Admitting: Family

## 2017-09-25 ENCOUNTER — Telehealth: Payer: Self-pay | Admitting: Cardiovascular Disease

## 2017-09-25 NOTE — Telephone Encounter (Signed)
New message  Pt c/o medication issue:  1. Name of Medication: furosemide (LASIX) 40 MG tablet  2. How are you currently taking this medication (dosage and times per day)? 3 times daily  3. Are you having a reaction (difficulty breathing--STAT)? No   4. What is your medication issue? Patient states that she is taking Ibuprofen for Arthritis and sciatica and it counteracts with the Lasix. Please advise if there is anther medication that can be taken instead of ibuprofen.

## 2017-09-25 NOTE — Telephone Encounter (Signed)
Would recommend she avoid the combination.  She can try acetaminophen 500-100 mg bid to help with the pain.  She should limit the ibuprofen to only rarely as needed as it can diminish the effect of furosemide.

## 2017-09-25 NOTE — Telephone Encounter (Signed)
Patient called with [facility] recommendations. Voiced understanding.

## 2018-06-13 ENCOUNTER — Telehealth: Payer: Self-pay | Admitting: *Deleted

## 2018-06-13 NOTE — Telephone Encounter (Signed)
A message was left, re: follow up visit. 

## 2018-06-26 NOTE — Telephone Encounter (Signed)
Follow up     Pt is returning call  She would like a call back because she is not sure if she needs to be seen in the office or schedule and Virtual Visit     Please call

## 2018-07-19 ENCOUNTER — Other Ambulatory Visit: Payer: Self-pay | Admitting: Cardiovascular Disease

## 2018-08-22 ENCOUNTER — Telehealth: Payer: Self-pay | Admitting: *Deleted

## 2018-08-22 ENCOUNTER — Telehealth (INDEPENDENT_AMBULATORY_CARE_PROVIDER_SITE_OTHER): Payer: Medicare Other | Admitting: Cardiovascular Disease

## 2018-08-22 DIAGNOSIS — E785 Hyperlipidemia, unspecified: Secondary | ICD-10-CM | POA: Diagnosis not present

## 2018-08-22 DIAGNOSIS — I10 Essential (primary) hypertension: Secondary | ICD-10-CM | POA: Diagnosis not present

## 2018-08-22 DIAGNOSIS — I5032 Chronic diastolic (congestive) heart failure: Secondary | ICD-10-CM | POA: Diagnosis not present

## 2018-08-22 DIAGNOSIS — I421 Obstructive hypertrophic cardiomyopathy: Secondary | ICD-10-CM | POA: Diagnosis not present

## 2018-08-22 DIAGNOSIS — R55 Syncope and collapse: Secondary | ICD-10-CM

## 2018-08-22 NOTE — Telephone Encounter (Signed)
Virtual Visit Pre-Appointment Phone Call  "(Name), I am calling you today to discuss your upcoming appointment. We are currently trying to limit exposure to the virus that causes COVID-19 by seeing patients at home rather than in the office."  1. "What is the BEST phone number to call the day of the visit?" - include this in appointment notes  2. "Do you have or have access to (through a family member/friend) a smartphone with video capability that we can use for your visit?" a. If yes - list this number in appt notes as "cell" (if different from BEST phone #) and list the appointment type as a VIDEO visit in appointment notes b. If no - list the appointment type as a PHONE visit in appointment notes  Confirm consent - "In the setting of the current Covid19 crisis, you are scheduled for a (phone or video) visit with your provider on (date) at (time).  Just as we do with many in-office visits, in order for you to participate in this visit, we must obtain consent.  If you'd like, I can send this to your mychart (if signed up) or email for you to review.  Otherwise, I can obtain your verbal consent now.  All virtual visits are billed to your insurance company just like a normal visit would be.  By agreeing to a virtual visit, we'd like you to understand that the technology does not allow for your provider to perform an examination, and thus may limit your provider's ability to fully assess your condition. If your provider identifies any concerns that need to be evaluated in person, we will make arrangements to do so.  Finally, though the technology is pretty good, we cannot assure that it will always work on either your or our end, and in the setting of a video visit, we may have to convert it to a phone-only visit.  In either situation, we cannot ensure that we have a secure connection.  Are you willing to proceed?"Yes 3. Advise patient to be prepared - "Two hours prior to your appointment, go ahead  and check your blood pressure, pulse, oxygen saturation, and your weight (if you have the equipment to check those) and write them all down. When your visit starts, your provider will ask you for this information. If you have an Apple Watch or Kardia device, please plan to have heart rate information ready on the day of your appointment. Please have a pen and paper handy nearby the day of the visit as well."  4. Give patient instructions for MyChart download to smartphone OR Doximity/Doxy.me as below if video visit (depending on what platform provider is using)  5. Inform patient they will receive a phone call 15 minutes prior to their appointment time (may be from unknown caller ID) so they should be prepared to answer    TELEPHONE CALL NOTE  Glorie Schartz has been deemed a candidate for a follow-up tele-health visit to limit community exposure during the Covid-19 pandemic. I spoke with the patient via phone to ensure availability of phone/video source, confirm preferred email & phone number, and discuss instructions and expectations.  I reminded Suleika Lampi to be prepared with any vital sign and/or heart rhythm information that could potentially be obtained via home monitoring, at the time of her visit. I reminded Marlinda Sesma to expect a phone call prior to her visit.  Ricci Barker, RN 08/22/2018 8:39 AM   INSTRUCTIONS FOR DOWNLOADING THE MYCHART APP TO  SMARTPHONE  - The patient must first make sure to have activated MyChart and know their login information - If Apple, go to [employer] Store and type in MyChart in the search bar and download the app. If Android, ask patient to go to [employer] Play Store and type in [place] in the search bar and download the app. The app is free but as with any other app downloads, their phone may require them to verify saved payment information or Apple/Android password.  - The patient will need to then log into the app with their MyChart username and password,  and select [facility] as their healthcare provider to link the account. When it is time for your visit, go to the MyChart app, find appointments, and click Begin Video Visit. Be sure to Select Allow for your device to access the Microphone and Camera for your visit. You will then be connected, and your provider will be with you shortly.  **If they have any issues connecting, or need assistance please contact MyChart service desk [phone])**  **If using a computer, in order to ensure the best quality for their visit they will need to use either of the following Internet Browsers: [employer] Edge, or Google Chrome**  IF USING DOXIMITY or DOXY.ME - The patient will receive a link just prior to their visit by text.     FULL LENGTH CONSENT FOR TELE-HEALTH VISIT   I hereby voluntarily request, consent and authorize [facility] and its employed or contracted physicians, physician assistants, nurse practitioners or other licensed health care professionals (the Practitioner), to provide me with telemedicine health care services (the "Services") as deemed necessary by the treating Practitioner. I acknowledge and consent to receive the Services by the Practitioner via telemedicine. I understand that the telemedicine visit will involve communicating with the Practitioner through live audiovisual communication technology and the disclosure of certain medical information by electronic transmission. I acknowledge that I have been given the opportunity to request an in-person assessment or other available alternative prior to the telemedicine visit and am voluntarily participating in the telemedicine visit.  I understand that I have the right to withhold or withdraw my consent to the use of telemedicine in the course of my care at any time, without affecting my right to future care or treatment, and that the Practitioner or I may terminate the telemedicine visit at any time. I understand that I  have the right to inspect all information obtained and/or recorded in the course of the telemedicine visit and may receive copies of available information for a reasonable fee.  I understand that some of the potential risks of receiving the Services via telemedicine include:  Marland Kitchen. Delay or interruption in medical evaluation due to technological equipment failure or disruption; . Information transmitted may not be sufficient (e.g. poor resolution of images) to allow for appropriate medical decision making by the Practitioner; and/or  . In rare instances, security protocols could fail, causing a breach of personal health information.  Furthermore, I acknowledge that it is my responsibility to provide information about my medical history, conditions and care that is complete and accurate to the best of my ability. I acknowledge that Practitioner's advice, recommendations, and/or decision may be based on factors not within their control, such as incomplete or inaccurate data provided by me or distortions of diagnostic images or specimens that may result from electronic transmissions. I understand that the practice of medicine is not an exact science and that Practitioner makes no warranties or  guarantees regarding treatment outcomes. I acknowledge that I will receive a copy of this consent concurrently upon execution via email to the email address I last provided but may also request a printed copy by calling the office of [facility].    I understand that my insurance will be billed for this visit.   I have read or had this consent read to me. . I understand the contents of this consent, which adequately explains the benefits and risks of the Services being provided via telemedicine.  . I have been provided ample opportunity to ask questions regarding this consent and the Services and have had my questions answered to my satisfaction. . I give my informed consent for the services to be provided through the  use of telemedicine in my medical care  By participating in this telemedicine visit I agree to the above.

## 2018-08-22 NOTE — Patient Instructions (Signed)
Medication Instructions:  Your physician recommends that you continue on your current medications as directed. Please refer to the Current Medication list given to you today.  If you need a refill on your cardiac medications before your next appointment, please call your pharmacy.   Lab work: None ordered  Testing/Procedures: None ordered  Follow-Up: At [facility], you and your health needs are our priority.  As part of our continuing mission to provide you with exceptional heart care, we have created designated Provider Care Teams.  These Care Teams include your primary Cardiologist (physician) and Advanced Practice Providers (APPs -  Physician Assistants and Nurse Practitioners) who all work together to provide you with the care you need, when you need it. You will need a follow up appointment in 3 months.  Please call our office 2 months in advance to schedule this appointment.  You may see Sanda Klein, MD or one of the following Advanced Practice Providers on your designated Care Team: Almyra Deforest, PA-C Fabian Sharp, [place]      3

## 2018-08-22 NOTE — Progress Notes (Signed)
Virtual Visit via Telephone Note   This visit type was conducted due to national recommendations for restrictions regarding the COVID-19 Pandemic (e.g. social distancing) in an effort to limit this patient's exposure and mitigate transmission in our community.  Due to her co-morbid illnesses, this patient is at least at moderate risk for complications without adequate follow up.  This format is felt to be most appropriate for this patient at this time.  The patient did not have access to video technology/had technical difficulties with video requiring transitioning to audio format only (telephone).  All issues noted in this document were discussed and addressed.  No physical exam could be performed with this format.  Please refer to the patient's chart for her  consent to telehealth for [facility].   Date:  08/22/2018   ID:  Christine Jacobson, DOB 20-Feb-1955, MRN [id number]  Patient Location: Home Provider Location: Home  PCP:  Patient, No Pcp Per  Cardiologist:  Sanda Klein, MD  Electrophysiologist:  None   Evaluation Performed:  Follow-Up Visit  Chief Complaint:  dyspnea  History of Present Illness:    Christine Jacobson is a 63 y.o. female with hypertrophic obstructive cardiomyopathy and diastolic heart failure, obesity and hypertension.  It has been a year since her last appointment.  3 months ago, at the height of the coronavirus lockdown, she had a syncopal event at home.  She was doing light housework, had a very brief episode of dizziness and remembers falling, then woke up on the ground and had a bruise around her eye.  She does not recall palpitations or angina preceding the event.  She was scared to seek medical attention due to the pandemic.  She has not had any issues with dizziness or syncope since then.  This is her third episode of loss of consciousness in the last roughly 8 years.  She has been less physically active due to the pandemic and has gained weight and is more short  of breath.  She has moderate pedal edema.  She does not have orthopnea or PND, exertional chest discomfort or exertional syncope.  She saw her new primary care provider, Dr. Kelton Pillar on Monday and had an ECG ("okay") and labs ("blood work okay, but protein spilling in the urine").  She is scheduled to have a 24-hour urine collection.  The echocardiogram performed March 05, 2015 showed a complex of findings that is highly consistent with hypertrophic obstructive cardiomyopathy including severe basal septal hypertrophy (2.5 cm) and LV outflow tract obstruction with a resting gradient of 64 mmHg increasing to 144 mmHg following the Valsalva maneuver. Mitral regurgitation was mild. The left atrium was severely dilated.  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  She has Medicare part A but has not yet had any supplemental insurance and is not sure she can afford it.  Finances are tight.  The patient does not have symptoms concerning for COVID-19 infection (fever, chills, cough, or new shortness of breath).    Past Medical History:  Diagnosis Date  . Chest pain 11/22/2010   2D STRESS ECHO - EF 60%, peak stress EF 80%, normal, no evidence for stress-induced ischemia  . HOCM (hypertrophic obstructive cardiomyopathy) ([place]) 06/21/2011   2D ECHO - EF >55%, normal  . HTN (hypertension)   . Hyperprolactinemia ([place])    dx in her 22, took meds, self d/c a while back  . Liver function test  abnormality    normal when repeated  . Mild hyperlipidemia   . Obesity   . Palpitations    negative stress echo in November 2012 with normal LV function; mild LVH, proximal septal thickening with narrow LVOT and mild gradient; mild MR and TR; Cardionet showed PACs in November 2012  . Pyoderma gangrenosa   . Shortness of breath 07/11/2011   MET TEST   Past Surgical History:  Procedure Laterality Date  . SKIN GRAFT  2006   porcine, R leg     Current  Meds  Medication Sig  . aspirin 81 MG tablet Take 81 mg by mouth daily as needed (takes when out of Toprol). Only taking occas  . furosemide (LASIX) 40 MG tablet TAKE 1 TABLET (40 MG TOTAL) BY MOUTH 3 (THREE) TIMES DAILY.  Marland Kitchen Ibuprofen 200 MG CAPS Take 200-800 mg by mouth every 6 (six) hours as needed (headache or pain).   . metoprolol succinate (TOPROL-XL) 50 MG 24 hr tablet Take 1 tablet (50 mg total) by mouth 3 (three) times daily.  . nitroGLYCERIN (NITROSTAT) 0.4 MG SL tablet 1 sublingual every 5 minutes 3 times if chest pain     Allergies:   Patient has no known allergies.   Social History   Tobacco Use  . Smoking status: Never Smoker  . Smokeless tobacco: Never Used  Substance Use Topics  . Alcohol use: Yes    Alcohol/week: 7.0 - 14.0 standard drinks    Types: 7 - 14 drink(s) per week    Comment: wine  . Drug use: No     Family Hx: The patient's family history includes Coronary artery disease in her unknown relative; Diabetes in her unknown relative; Emphysema in her father; Heart disease in her mother; Ovarian cancer in her mother. There is no history of Cancer.  ROS:   Please see the history of present illness.    All other systems reviewed and are negative.   Prior CV studies:   The following studies were reviewed today:  Labs/Other Tests and Data Reviewed:    EKG:  No ECG reviewed.  Recent Labs: No results found for requested labs within last 8760 hours.   Recent Lipid Panel Lab Results  Component Value Date/Time   CHOL 229 (H) 05/26/2011 09:28 AM   TRIG 59.0 05/26/2011 09:28 AM   HDL 100.90 05/26/2011 09:28 AM   CHOLHDL 2 05/26/2011 09:28 AM   LDLDIRECT 121.8 05/26/2011 09:28 AM    Wt Readings from Last 3 Encounters:  09/20/17 214 lb (97.1 kg)  08/10/17 214 lb (97.1 kg)  02/09/17 214 lb (97.1 kg)     Objective:    Vital Signs:  There were no vitals taken for this visit.   VITAL SIGNS:  reviewed Unable to examine  ASSESSMENT & PLAN:    1.  Syncope: It is concerning that she could have arrhythmic syncope related to hypertrophic cardiomyopathy. She has had 2 previous episodes of syncope , but both events sounded more consistent with vasovagal or hemodynamic mechanism rather than arrhythmia.  The current episode was more abrupt and not associated with precipitating circumstances.  I advised an implantable loop recorder, but finances will be an issue.  We will get her ECG and labs from Dr. Laurann Montana and touch base with her again.  I wonder if we can get some financial assistance for the loop recorder.  2. HOCM: The other possibility is that she had syncope related to obstruction.  She is on beta-blockers 3 times  a day.  Unable to get vital signs today, but hopefully will be able to see her vital signs from her recent appointment with Dr. Laurann Montana.  Advised her not to take metolazone until we clarify the cause of her syncope (she has never taken it yet).  May have to reevaluate for possible septal myomectomy.  In the meantime avoid excessive diuresis, avoid heat, avoid straining.  Avoid abrupt discontinuation of beta-blockers.  She should call promptly for any future near syncope or syncope episodes.  COVID-19 Education: The signs and symptoms of COVID-19 were discussed with the patient and how to seek care for testing (follow up with PCP or arrange E-visit).  The importance of social distancing was discussed today.  Time:   Today, I have spent 22 minutes with the patient with telehealth technology discussing the above problems.     Medication Adjustments/Labs and Tests Ordered: Current medicines are reviewed at length with the patient today.  Concerns regarding medicines are outlined above.   Tests Ordered: No orders of the defined types were placed in this encounter.   Medication Changes: No orders of the defined types were placed in this encounter.   Follow Up:  In Person 3 months  Signed, Sanda Klein, MD  08/22/2018 11:23 AM     [place]

## 2018-08-28 ENCOUNTER — Ambulatory Visit: Payer: Self-pay | Admitting: Cardiovascular Disease

## 2018-10-14 ENCOUNTER — Other Ambulatory Visit: Payer: Self-pay | Admitting: Cardiovascular Disease

## 2018-11-27 ENCOUNTER — Ambulatory Visit: Payer: Medicare Other | Admitting: Cardiovascular Disease

## 2018-12-06 ENCOUNTER — Telehealth: Payer: Self-pay | Admitting: Cardiovascular Disease

## 2018-12-06 NOTE — Telephone Encounter (Signed)
New message:      Patient calling to let the doctor she has get tested for covid this week.

## 2018-12-06 NOTE — Telephone Encounter (Signed)
Pt would like Dr. Sallyanne Kuster to be aware of the following:  Pt states that beginning Tuesday afternoon she suddenly began feeling tired, shaky, SOB, she had chills and left work early. Her PCP told her to go and get a COVID test. She has been afebrile and feeling better as of today. She has [facility] to get tested for COVID-19. She is still experiencing some mild SOB on exertion. She has not been back to work yet. Pt will call once she knows the results of her test. I advised pt to call back with worsening symptoms such as fever, SOB, CP, N/V. She verbalized understanding.

## 2018-12-10 ENCOUNTER — Telehealth: Payer: Self-pay | Admitting: Cardiovascular Disease

## 2018-12-10 NOTE — Telephone Encounter (Signed)
Called patient back- she has no way to print and fax, so she would like to contact her PCP office if we need the results-  Her PCP is Derrek Monaco, MD advised I would send to primary nurse in case they needed the results. Patient was thankful, and had no other questions.

## 2018-12-10 NOTE — Telephone Encounter (Signed)
New Message  Pt calling and states that she took Covid-19 test and results came back negative. Would like to know how to send results to office  Please call

## 2019-01-01 ENCOUNTER — Other Ambulatory Visit: Payer: Self-pay | Admitting: Cardiovascular Disease

## 2019-01-01 NOTE — Telephone Encounter (Signed)
Rx has been sent to the pharmacy electronically. ° °

## 2019-01-09 ENCOUNTER — Ambulatory Visit (INDEPENDENT_AMBULATORY_CARE_PROVIDER_SITE_OTHER): Payer: Medicare Other | Admitting: Cardiovascular Disease

## 2019-01-09 ENCOUNTER — Other Ambulatory Visit: Payer: Self-pay

## 2019-01-09 ENCOUNTER — Encounter: Payer: Self-pay | Admitting: Cardiovascular Disease

## 2019-01-09 VITALS — BP 130/72 | HR 68 | Temp 96.4°F | Ht 63.0 in | Wt 211.0 lb

## 2019-01-09 DIAGNOSIS — R55 Syncope and collapse: Secondary | ICD-10-CM

## 2019-01-09 DIAGNOSIS — I421 Obstructive hypertrophic cardiomyopathy: Secondary | ICD-10-CM

## 2019-01-09 DIAGNOSIS — I10 Essential (primary) hypertension: Secondary | ICD-10-CM

## 2019-01-09 DIAGNOSIS — I5032 Chronic diastolic (congestive) heart failure: Secondary | ICD-10-CM

## 2019-01-09 MED ORDER — METOPROLOL SUCCINATE ER 100 MG PO TB24: 100.0000 mg | ORAL_TABLET | Freq: Two times a day (BID) | ORAL | 1 refills | Status: DC

## 2019-01-09 NOTE — Progress Notes (Signed)
patient ID: Christine Jacobson, female   DOB: Mar 11, 1955, 63 y.o.   MRN: [id number]    Cardiology Office Note    Date:  01/14/2019   ID:  Christine Jacobson, DOB [birth year], MRN [id number]  PCP:  Patient, No Pcp Per  Cardiologist:   Sanda Klein, MD   Chief Complaint  Patient presents with  . Loss of Consciousness  . Cardiomyopathy    History of Present Illness:  Christine Jacobson is a 63 y.o. female with HOCM and a history of mild hypertension, syncope (probably vasovagal).  She believes she has had another syncopal event.  It occurred while she was rushing around her own home alone.  She did not injure herself.  1 day she woke up with chest tightness and headache "all day".  She has no chest pain currently.  She has not had palpitations.  She denies orthopnea or PND.  On the other hand, her breathing has improved, probably at least in part because she has lost a little weight.  She walks her dog cookie regularly.  She can climb a flight of stairs without stopping.  She has mild pedal edema.    She is now working only at [employer]  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  She has found that she does best when she takes the metoprolol 3 times a day and the furosemide also 3 times a day.   The echocardiogram performed March 05, 2015 showed a complex of findings that is highly consistent with hypertrophic obstructive cardiomyopathy including severe basal septal hypertrophy (2.5 cm) and LV outflow tract obstruction with a resting gradient of 64 mmHg increasing to 144 mmHg following the Valsalva maneuver. Mitral regurgitation was mild. The left atrium was severely dilated.  The follow-up echocardiogram in July 2019 confirmed severe LVH, EF 65 to 70%, elevated filling pressures/pseudonormal pattern.  Unfortunately Valsalva maneuver was not performed to check for an inducible  gradient.  Past Medical History:  Diagnosis Date  . Chest pain 11/22/2010   2D STRESS ECHO - EF 60%, peak stress EF 80%, normal, no evidence for stress-induced ischemia  . HOCM (hypertrophic obstructive cardiomyopathy) ([place]) 06/21/2011   2D ECHO - EF >55%, normal  . HTN (hypertension)   . Hyperprolactinemia ([place])    dx in her 73, took meds, self d/c a while back  . Liver function test abnormality    normal when repeated  . Mild hyperlipidemia   . Obesity   . Palpitations    negative stress echo in November 2012 with normal LV function; mild LVH, proximal septal thickening with narrow LVOT and mild gradient; mild MR and TR; Cardionet showed PACs in November 2012  . Pyoderma gangrenosa   . Shortness of breath 07/11/2011   MET TEST    Past Surgical History:  Procedure Laterality Date  . SKIN GRAFT  2006   porcine, R leg    Outpatient Medications Prior to Visit  Medication Sig Dispense Refill  . aspirin 81 MG tablet Take 81 mg by mouth daily as needed (takes when out of Toprol). Only taking occas    . furosemide (LASIX) 40 MG tablet TAKE 1 TABLET (40 MG TOTAL) BY MOUTH 3 (THREE) TIMES DAILY. 270 tablet 2  . Ibuprofen 200 MG CAPS Take 200-800 mg by mouth every 6 (six) hours as needed (headache or pain).     Marland Kitchen metolazone (  ZAROXOLYN) 2.5 MG tablet Take by mouth as directed. (Patient not taking: Reported on 08/22/2018) 15 tablet 2  . nitroGLYCERIN (NITROSTAT) 0.4 MG SL tablet 1 sublingual every 5 minutes 3 times if chest pain 20 tablet 1  . predniSONE (DELTASONE) 50 MG tablet Take one tablet daily x 5 days (Patient not taking: Reported on 08/22/2018) 5 tablet 0  . metoprolol succinate (TOPROL-XL) 50 MG 24 hr tablet TAKE 1 TABLET BY MOUTH THREE TIMES A DAY 270 tablet 3   No facility-administered medications prior to visit.     Allergies:   Patient has no known allergies.   Social History   Socioeconomic History  . Marital status: Single    Spouse name: Not on file  . Number of  children: 0  . Years of education: Not on file  . Highest education level: Not on file  Occupational History  . Occupation: para Statistician, part time cook  Tobacco Use  . Smoking status: Never Smoker  . Smokeless tobacco: Never Used  Substance and Sexual Activity  . Alcohol use: Yes    Alcohol/week: 7.0 - 14.0 standard drinks    Types: 7 - 14 drink(s) per week    Comment: wine  . Drug use: No  . Sexual activity: Not Currently  Other Topics Concern  . Not on file  Social History Narrative   Lives by self   Social Determinants of Health   Financial Resource Strain:   . Difficulty of Paying Living Expenses: Not on file  Food Insecurity:   . Worried About Charity fundraiser in the Last Year: Not on file  . Ran Out of Food in the Last Year: Not on file  Transportation Needs:   . Lack of Transportation (Medical): Not on file  . Lack of Transportation (Non-Medical): Not on file  Physical Activity:   . Days of Exercise per Week: Not on file  . Minutes of Exercise per Session: Not on file  Stress:   . Feeling of Stress : Not on file  Social Connections:   . Frequency of Communication with Friends and Family: Not on file  . Frequency of Social Gatherings with Friends and Family: Not on file  . Attends Religious Services: Not on file  . Active Member of Clubs or Organizations: Not on file  . Attends Archivist Meetings: Not on file  . Marital Status: Not on file     Family History:  The patient's family history includes Coronary artery disease in her unknown relative; Diabetes in her unknown relative; Emphysema in her father; Heart disease in her mother; Ovarian cancer in her mother.   ROS:   Please see the history of present illness.    ROS All other systems are reviewed and are negative.   PHYSICAL EXAM:   VS:  BP 130/72   Pulse 68   Temp (!) 96.4 F (35.8 C)   Ht 5' 3"  (1.6 m)   Wt 211 lb (95.7 kg)   BMI 37.38 kg/m     General: Alert, oriented  x3, no distress, obese Head: no evidence of trauma, PERRL, EOMI, no exophtalmos or lid lag, no myxedema, no xanthelasma; normal ears, nose and oropharynx Neck: normal jugular venous pulsations and no hepatojugular reflux; brisk carotid pulses without delay and no carotid bruits Chest: clear to auscultation, no signs of consolidation by percussion or palpation, normal fremitus, symmetrical and full respiratory excursions Cardiovascular: normal position and quality of the apical impulse, regular rhythm, normal  first and second heart sounds, 3/6 systolic murmur is heard both at the base and at the apex, intensifying with a Valsalva maneuver and diminishes with handgrip, no diastolic murmurs, rubs or gallops Abdomen: no tenderness or distention, no masses by palpation, no abnormal pulsatility or arterial bruits, normal bowel sounds, no hepatosplenomegaly Extremities: no clubbing, cyanosis ; there is symmetrical 1+ pedal edema; 2+ radial, ulnar and brachial pulses bilaterally; 2+ right femoral, posterior tibial and dorsalis pedis pulses; 2+ left femoral, posterior tibial and dorsalis pedis pulses; no subclavian or femoral bruits Neurological: grossly nonfocal Psych: Normal mood and affect'  Wt Readings from Last 3 Encounters:  01/09/19 211 lb (95.7 kg)  09/20/17 214 lb (97.1 kg)  08/10/17 214 lb (97.1 kg)      Studies/Labs Reviewed:   EKG:  EKG is ordered today.  It shows sinus bradycardia, left atrial abnormality, left ventricular hypertrophy, normal QT Recent Labs: No results found for requested labs within last 8760 hours.   Lipid Panel    Component Value Date/Time   CHOL 229 (H) 05/26/2011 0928   TRIG 59.0 05/26/2011 0928   HDL 100.90 05/26/2011 0928   CHOLHDL 2 05/26/2011 0928   VLDL 11.8 05/26/2011 0928   LDLDIRECT 121.8 05/26/2011 0928    ASSESSMENT:    1. Obstructive hypertrophic cardiomyopathy ([place])   2. Chronic diastolic heart failure ([place])   3. Essential hypertension   4.  Syncope and collapse      PLAN:  In order of problems listed above:  1. HOCM: She has had 3, maybe 4 episodes of syncope in her life, all of them in the last 6 or 7 years.  Many of these had features suggestive of vasovagal syncope, but she is also at risk for ventricular arrhythmia.  Otherwise, she does not have high risk features such as family history of unexplained sudden death or excessive LVH.  I recommended again that we perform at least loop recorder implantation to clarify the cause for her syncopal events.  We will also beta-blocker given her anticoagulated early (prefers to take this antibiotic doses).  It is quite possible that in the future she may require septal myectomy or alcohol septal ablation, as long as her symptoms are relatively well controlled, can defer aggressive procedures.  She should avoid heat and excessive physical exertion.   2. CHF: As before, tolerate some pedal edema and avoid excessive diuresis which could precipitate worsening LVOT obstruction and syncope..   3. HTN: Remains well controlled.  Her pattern of LVH is more consistent with genetic hypertrophic cardiomyopathy than it is with secondary hypertrophy. 4. Syncope: Discussed loop recorder implantation.  She is concerned about a cough, but otherwise appears agreeable. This procedure has been fully reviewed with the patient and informed consent has been obtained.  We will ask our billing department to clarify the cost.   Medication Adjustments/Labs and Tests Ordered: Current medicines are reviewed at length with the patient today.  Concerns regarding medicines are outlined above.  Medication changes, Labs and Tests ordered today are listed in the Patient Instructions below. Patient Instructions  Medication Instructions:  CHANGE: How you take the Metoprolol to 100 mg twice daily.  *If you need a refill on your cardiac medications before your next appointment, please call your pharmacy*  Lab Work: None  ordered If you have labs (blood work) drawn today and your tests are completely normal, you will receive your results only by: Marland Kitchen MyChart Message (if you have MyChart) OR . A paper  copy in the mail If you have any lab test that is abnormal or we need to change your treatment, we will call you to review the results.  Testing/Procedures: None ordered  Follow-Up: At [facility], you and your health needs are our priority.  As part of our continuing mission to provide you with exceptional heart care, we have created designated Provider Care Teams.  These Care Teams include your primary Cardiologist (physician) and Advanced Practice Providers (APPs -  Physician Assistants and Nurse Practitioners) who all work together to provide you with the care you need, when you need it.  Your next appointment:   6 month(s)  The format for your next appointment:   In Person  Provider:   You may see Sanda Klein, MD or one of the following Advanced Practice Providers on your designated Care Team:    Almyra Deforest, PA-C  Fabian Sharp, [place] or   Roby Lofts, PA-C      Signed, Sanda Klein, MD  01/14/2019 6:22 PM    [facility] [address], [place], [place]  [address] Phone: [phone]; Fax: [phone]

## 2019-01-09 NOTE — Patient Instructions (Signed)
Medication Instructions:  CHANGE: How you take the Metoprolol to 100 mg twice daily.  *If you need a refill on your cardiac medications before your next appointment, please call your pharmacy*  Lab Work: None ordered If you have labs (blood work) drawn today and your tests are completely normal, you will receive your results only by: Marland Kitchen MyChart Message (if you have MyChart) OR . A paper copy in the mail If you have any lab test that is abnormal or we need to change your treatment, we will call you to review the results.  Testing/Procedures: None ordered  Follow-Up: At [facility], you and your health needs are our priority.  As part of our continuing mission to provide you with exceptional heart care, we have created designated Provider Care Teams.  These Care Teams include your primary Cardiologist (physician) and Advanced Practice Providers (APPs -  Physician Assistants and Nurse Practitioners) who all work together to provide you with the care you need, when you need it.  Your next appointment:   6 month(s)  The format for your next appointment:   In Person  Provider:   You may see Sanda Klein, MD or one of the following Advanced Practice Providers on your designated Care Team:    Almyra Deforest, PA-C  Fabian Sharp, PA-C or   Roby Lofts, [place]

## 2019-01-14 ENCOUNTER — Encounter: Payer: Self-pay | Admitting: Cardiovascular Disease

## 2019-06-18 ENCOUNTER — Other Ambulatory Visit: Payer: Self-pay | Admitting: Cardiovascular Disease

## 2019-06-19 ENCOUNTER — Telehealth: Payer: Self-pay | Admitting: Cardiovascular Disease

## 2019-06-19 MED ORDER — METOPROLOL SUCCINATE ER 100 MG PO TB24: 100.0000 mg | ORAL_TABLET | Freq: Two times a day (BID) | ORAL | 1 refills | Status: DC

## 2019-06-19 NOTE — Telephone Encounter (Signed)
Left message to call back to clarify rx

## 2019-06-19 NOTE — Telephone Encounter (Signed)
Spoke to patient, she states she informed MD at last OV she was taking her metoprolol succinate (Toprol XL) 100 three times daily.     Advised per OV note and AVS instructions they had requested to decrease to BID.     She states she takes 100 mg in the AM, at lunch, and PM.   States she works a very stressful job and she takes one at lunch "so her heart rate slows down".   She says this may be stress related.    States her BP is "good".   Advised rx for BID has been sent for BID, can pick up so she doesn't go without.   Also advised would take as recommended BID, would send message to MD to review.   Patient verbalized understanding.

## 2019-06-19 NOTE — Telephone Encounter (Signed)
Pt c/o medication issue:  1. Name of Medication: metoprolol succinate (TOPROL-XL) 100 MG 24 hr tablet  2. How are you currently taking this medication (dosage and times per day)? 1 tablet 3 times a day  3. Are you having a reaction (difficulty breathing--STAT)? no  4. What is your medication issue? Patient states she needs a new prescription of the medication for 3 times a day sent to [facility]. She states she gets a 90 day supply and she is out of the medication.

## 2019-06-26 ENCOUNTER — Encounter: Payer: Self-pay | Admitting: Cardiology

## 2019-06-26 ENCOUNTER — Telehealth (INDEPENDENT_AMBULATORY_CARE_PROVIDER_SITE_OTHER): Payer: Medicare Other | Admitting: Cardiology

## 2019-06-26 VITALS — Ht 63.0 in | Wt 194.0 lb

## 2019-06-26 DIAGNOSIS — I421 Obstructive hypertrophic cardiomyopathy: Secondary | ICD-10-CM | POA: Diagnosis not present

## 2019-06-26 DIAGNOSIS — R55 Syncope and collapse: Secondary | ICD-10-CM

## 2019-06-26 DIAGNOSIS — Z87898 Personal history of other specified conditions: Secondary | ICD-10-CM | POA: Diagnosis not present

## 2019-06-26 DIAGNOSIS — I5032 Chronic diastolic (congestive) heart failure: Secondary | ICD-10-CM

## 2019-06-26 NOTE — Patient Instructions (Signed)
Medication Instructions:  Your physician recommends that you continue on your current medications as directed. Please refer to the Current Medication list given to you today.  *If you need a refill on your cardiac medications before your next appointment, please call your pharmacy*    Follow-Up: At [facility], you and your health needs are our priority.  As part of our continuing mission to provide you with exceptional heart care, we have created designated Provider Care Teams.  These Care Teams include your primary Cardiologist (physician) and Advanced Practice Providers (APPs -  Physician Assistants and Nurse Practitioners) who all work together to provide you with the care you need, when you need it.  We recommend signing up for the patient portal called "MyChart".  Sign up information is provided on this After Visit Summary.  MyChart is used to connect with patients for Virtual Visits (Telemedicine).  Patients are able to view lab/test results, encounter notes, upcoming appointments, etc.  Non-urgent messages can be sent to your provider as well.   To learn more about what you can do with MyChart, go to [email or web address].    Your next appointment:   6 month(s)  The format for your next appointment:   In Person  Provider:   You may see Thurmon Fair, MD or one of the following Advanced Practice Providers on your designated Care Team:    Azalee Course, PA-C  Micah Flesher, [place] or   Judy Pimple, [place]    Other Instructions Please call our office 2 months in advance to schedule your follow-up appointment with Dr. Royann Shivers.

## 2019-06-26 NOTE — Progress Notes (Signed)
TY

## 2019-06-26 NOTE — Progress Notes (Signed)
Virtual Visit via Telephone Note   This visit type was conducted due to national recommendations for restrictions regarding the COVID-19 Pandemic (e.g. social distancing) in an effort to limit this patient's exposure and mitigate transmission in our community.  Due to her co-morbid illnesses, this patient is at least at moderate risk for complications without adequate follow up.  This format is felt to be most appropriate for this patient at this time.  The patient did not have access to video technology/had technical difficulties with video requiring transitioning to audio format only (telephone).  All issues noted in this document were discussed and addressed.  No physical exam could be performed with this format.  Please refer to the patient's chart for her  consent to telehealth for [facility].   The patient was identified using 2 identifiers.  Date:  06/26/2019   ID:  Christine Jacobson, DOB 09-20-1955, MRN [id number]  Patient Location: Home Provider Location: Home  PCP:  Patient, No Pcp Per  Cardiologist:  Sanda Klein, MD  Electrophysiologist:  None   Evaluation Performed:  Follow-Up Visit  Chief Complaint:  none  History of Present Illness:    Christine Jacobson is a 64 y.o. female with a history of hypertrophic obstructive cardiomyopathy and past syncope which is felt to be vasovagal.  She was contacted today for routine follow-up.  The patient has not had syncope since her last office visit.  She has developed some back issues.  She was prescribed diclofenac and tells me she takes Advil in between.  She tells me that Tylenol causes stomach upset.  She is still working at [employer] at [employer].  She is on her feet most of the day.  She says her job causes her a lot of stress and despite our recommendations she takes her Toprol 3 times a day.  In discussing her work situation she became tearful.  I offered her an office visit but she declined saying that we had more important things  to do and sicker patients.  The patient does not have symptoms concerning for COVID-19 infection (fever, chills, cough, or new shortness of breath).    Past Medical History:  Diagnosis Date  . Chest pain 11/22/2010   2D STRESS ECHO - EF 60%, peak stress EF 80%, normal, no evidence for stress-induced ischemia  . HOCM (hypertrophic obstructive cardiomyopathy) ([place]) 06/21/2011   2D ECHO - EF >55%, normal  . HTN (hypertension)   . Hyperprolactinemia ([place])    dx in her 34, took meds, self d/c a while back  . Liver function test abnormality    normal when repeated  . Mild hyperlipidemia   . Obesity   . Palpitations    negative stress echo in November 2012 with normal LV function; mild LVH, proximal septal thickening with narrow LVOT and mild gradient; mild MR and TR; Cardionet showed PACs in November 2012  . Pyoderma gangrenosa   . Shortness of breath 07/11/2011   MET TEST   Past Surgical History:  Procedure Laterality Date  . SKIN GRAFT  2006   porcine, R leg     Current Meds  Medication Sig  . aspirin 81 MG tablet Take 81 mg by mouth daily as needed (takes when out of Toprol). Only taking occas  . diclofenac (VOLTAREN) 75 MG EC tablet Take 75 mg by mouth 2 (two) times daily as needed.  . furosemide (LASIX) 40 MG tablet TAKE 1 TABLET (40 MG TOTAL) BY MOUTH 3 (THREE) TIMES DAILY.  Marland Kitchen  Ibuprofen 200 MG CAPS Take 200-800 mg by mouth every 6 (six) hours as needed (headache or pain).   Marland Kitchen metolazone (ZAROXOLYN) 2.5 MG tablet Take by mouth as directed.  . metoprolol succinate (TOPROL-XL) 100 MG 24 hr tablet Take 1 tablet (100 mg total) by mouth 2 (two) times daily. Take with or immediately following a meal.  . nitroGLYCERIN (NITROSTAT) 0.4 MG SL tablet 1 sublingual every 5 minutes 3 times if chest pain  . predniSONE (DELTASONE) 50 MG tablet Take one tablet daily x 5 days     Allergies:   Patient has no known allergies.   Social History   Tobacco Use  . Smoking status: Never Smoker  .  Smokeless tobacco: Never Used  Substance Use Topics  . Alcohol use: Yes    Alcohol/week: 7.0 - 14.0 standard drinks    Types: 7 - 14 drink(s) per week    Comment: wine  . Drug use: No     Family Hx: The patient's family history includes Coronary artery disease in her unknown relative; Diabetes in her unknown relative; Emphysema in her father; Heart disease in her mother; Ovarian cancer in her mother. There is no history of Cancer.  ROS:   Please see the history of present illness.    All other systems reviewed and are negative.   Prior CV studies:   The following studies were reviewed today:  Echo- 08/16/2017- Study Conclusions   - Left ventricle: The cavity size was normal. Wall thickness was  increased in a pattern of severe LVH. Systolic function was  vigorous. The estimated ejection fraction was in the range of 65%  to 70%. Wall motion was normal; there were no regional wall  motion abnormalities. Features are consistent with a pseudonormal  left ventricular filling pattern, with concomitant abnormal  relaxation and increased filling pressure (grade 2 diastolic  dysfunction).  - Aortic valve: Mildly calcified annulus. Mildly thickened  leaflets.  - Mitral valve: There was mild regurgitation.  - Left atrium: The atrium was severely dilated.  - Right atrium: The atrium was mildly dilated.  - Pulmonary arteries: Systolic pressure was mildly increased. PA  peak pressure: 38 mm Hg (S).   Labs/Other Tests and Data Reviewed:    EKG:  An ECG dated 01/09/2019 was personally reviewed today and demonstrated:  NSR, PVC, LVH  Recent Labs: No results found for requested labs within last 8760 hours.   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  Instructed to not take two different NSAIDs and to try Tylenol again, that GI upset is an unusual side effect.  F/U  Dr Sallyanne Kuster in 6 months.   COVID-19 Education: The signs and symptoms of COVID-19 were discussed with the patient and how to seek care for testing (follow up with PCP or arrange E-visit).  The importance of social distancing was discussed today.  Time:   Today, I have spent 15 minutes with the patient with telehealth technology discussing the above problems.     Medication Adjustments/Labs and Tests Ordered: Current medicines are reviewed at length with the patient today.  Concerns regarding medicines are outlined above.   Tests Ordered: No orders of the defined types were placed in this encounter.   Medication Changes: No orders of the defined types were placed in this encounter.   Follow Up:  In Person Dr Sallyanne Kuster in 6 months.   Signed, Kerin Ransom, PA-C  06/26/2019 10:00 AM     [facility]

## 2019-07-18 ENCOUNTER — Emergency Department (HOSPITAL_BASED_OUTPATIENT_CLINIC_OR_DEPARTMENT_OTHER)
Admission: EM | Admit: 2019-07-18 | Discharge: 2019-07-18 | Disposition: A | Discharge status: Home / Self Care | Payer: Medicare Other | Attending: Emergency Medicine | Admitting: Emergency Medicine

## 2019-07-18 ENCOUNTER — Emergency Department (HOSPITAL_BASED_OUTPATIENT_CLINIC_OR_DEPARTMENT_OTHER): Payer: Medicare Other

## 2019-07-18 ENCOUNTER — Encounter (HOSPITAL_BASED_OUTPATIENT_CLINIC_OR_DEPARTMENT_OTHER): Payer: Self-pay | Admitting: *Deleted

## 2019-07-18 ENCOUNTER — Other Ambulatory Visit: Payer: Self-pay

## 2019-07-18 DIAGNOSIS — Y999 Unspecified external cause status: Secondary | ICD-10-CM | POA: Insufficient documentation

## 2019-07-18 DIAGNOSIS — S8992XA Unspecified injury of left lower leg, initial encounter: Secondary | ICD-10-CM | POA: Diagnosis present

## 2019-07-18 DIAGNOSIS — I10 Essential (primary) hypertension: Secondary | ICD-10-CM | POA: Insufficient documentation

## 2019-07-18 DIAGNOSIS — Z7982 Long term (current) use of aspirin: Secondary | ICD-10-CM | POA: Diagnosis not present

## 2019-07-18 DIAGNOSIS — Y929 Unspecified place or not applicable: Secondary | ICD-10-CM | POA: Insufficient documentation

## 2019-07-18 DIAGNOSIS — S80812A Abrasion, left lower leg, initial encounter: Secondary | ICD-10-CM | POA: Diagnosis not present

## 2019-07-18 DIAGNOSIS — W19XXXA Unspecified fall, initial encounter: Secondary | ICD-10-CM

## 2019-07-18 DIAGNOSIS — R062 Wheezing: Secondary | ICD-10-CM | POA: Diagnosis not present

## 2019-07-18 DIAGNOSIS — Y93K9 Activity, other involving animal care: Secondary | ICD-10-CM | POA: Diagnosis not present

## 2019-07-18 DIAGNOSIS — T148XXA Other injury of unspecified body region, initial encounter: Secondary | ICD-10-CM

## 2019-07-18 DIAGNOSIS — W1839XA Other fall on same level, initial encounter: Secondary | ICD-10-CM | POA: Insufficient documentation

## 2019-07-18 DIAGNOSIS — R0602 Shortness of breath: Secondary | ICD-10-CM | POA: Insufficient documentation

## 2019-07-18 LAB — CBC
HCT: 38.5 % (ref 36.0–46.0)
Hemoglobin: 12.5 g/dL (ref 12.0–15.0)
MCH: 33.1 pg (ref 26.0–34.0)
MCHC: 32.5 g/dL (ref 30.0–36.0)
MCV: 101.9 fL — ABNORMAL HIGH (ref 80.0–100.0)
Platelets: 227 10*3/uL (ref 150–400)
RBC: 3.78 MIL/uL — ABNORMAL LOW (ref 3.87–5.11)
RDW: 13.8 % (ref 11.5–15.5)
WBC: 8.7 10*3/uL (ref 4.0–10.5)
nRBC: 0 % (ref 0.0–0.2)

## 2019-07-18 LAB — BASIC METABOLIC PANEL
Anion gap: 12 (ref 5–15)
BUN: 25 mg/dL — ABNORMAL HIGH (ref 8–23)
CO2: 25 mmol/L (ref 22–32)
Calcium: 9.5 mg/dL (ref 8.9–10.3)
Chloride: 106 mmol/L (ref 98–111)
Creatinine, Ser: 1.08 mg/dL — ABNORMAL HIGH (ref 0.44–1.00)
GFR calc Af Amer: >60 mL/min (ref >60)
GFR calc non Af Amer: 53 mL/min — ABNORMAL LOW (ref >60)
Glucose, Bld: 110 mg/dL — ABNORMAL HIGH (ref 70–99)
Potassium: 3.4 mmol/L — ABNORMAL LOW (ref 3.5–5.1)
Sodium: 143 mmol/L (ref 135–145)

## 2019-07-18 MED ORDER — ALBUTEROL SULFATE HFA 108 (90 BASE) MCG/ACT IN AERS
2.0000 | INHALATION_SPRAY | Freq: Four times a day (QID) | RESPIRATORY_TRACT | Status: DC | PRN
  Administered 2019-07-18: 2 via RESPIRATORY_TRACT
  Filled 2019-07-18: qty 6.7

## 2019-07-18 NOTE — Discharge Instructions (Addendum)
Work-up here today without any significant abnormalities.  Wound care to the scrape on your left leg.   For the wheezing use the albuterol inhaler 2 puffs every 6 hours.  Chest x-ray today without any significant findings no evidence of pneumonia.  And make an appointment to follow-up with your primary care doctor for recheck and cardiologist as scheduled

## 2019-07-18 NOTE — ED Provider Notes (Addendum)
[facility] EM[facility]Y DEPARTMENT Provider Note   CSN: [id number] Arrival date & time: 07/18/19  1144     History Chief Complaint  Patient presents with  . Fall    Christine Jacobson is a 64 y.o. female.  Patient's dog got loose from her.  Patient was chasing her dog and fell this morning.  Patient has an abrasion to the left leg but this actually from her dogs fall.  Stated that when she was going after her dog her left leg get became numb and gave out.  No pain in the leg.  No injuries from the fall other than the abrasion.  Patient also become very winded when she was chasing after the dog.  No chest pain.  Patient's primary care provider is in the [facility] area.  In addition patient followed by Dr. Loletha Grayer cardiology in [place].  Patient did not pass out.  No syncope.  Patient states that for the past 2 weeks she has been feeling wheezy.  Thought maybe she had an upper respiratory infection.  EMS was called to the scene they recommended she get evaluated.  Past medical history significant for hypertension, shortness of breath, hypertrophic obstructive cardiomyopathy.        Past Medical History:  Diagnosis Date  . Chest pain 11/22/2010   2D STRESS ECHO - EF 60%, peak stress EF 80%, normal, no evidence for stress-induced ischemia  . HOCM (hypertrophic obstructive cardiomyopathy) ([place]) 06/21/2011   2D ECHO - EF >55%, normal  . HTN (hypertension)   . Hyperprolactinemia ([place])    dx in her 57, took meds, self d/c a while back  . Liver function test abnormality    normal when repeated  . Mild hyperlipidemia   . Obesity   . Palpitations    negative stress echo in November 2012 with normal LV function; mild LVH, proximal septal thickening with narrow LVOT and mild gradient; mild MR and TR; Cardionet showed PACs in November 2012  . Pyoderma gangrenosa   . Shortness of breath 07/11/2011   MET TEST    Patient Active Problem List   Diagnosis Date Noted  . Obesity   . Liver  function test abnormality   . HTN (hypertension)   . Chronic diastolic heart failure ([place]) 12/15/2016  . HOCM (hypertrophic obstructive cardiomyopathy) ([place]) 01/22/2016  . Lower extremity edema 09/17/2014  . History of syncope 12/05/2013  . Pneumonia 11/23/2012  . Shortness of breath 07/11/2011  . Hypertension 11/23/2010  . Chest pain 10/18/2010  . Murmur 10/18/2010  . Palpitations 09/23/2010  . Mild hyperlipidemia   . Elevated BP   . Hyperprolactinemia ([place])   . Pyoderma gangrenosa     Past Surgical History:  Procedure Laterality Date  . SKIN GRAFT  2006   porcine, R leg     OB History   No obstetric history on file.     Family History  Problem Relation Age of Onset  . Heart disease Mother        endocarditis  . Ovarian cancer Mother   . Emphysema Father   . Coronary artery disease Other        F in his 90  . Diabetes Other        GP  . Cancer Neg Hx     Social History   Tobacco Use  . Smoking status: Never Smoker  . Smokeless tobacco: Never Used  Substance Use Topics  . Alcohol use: Yes    Alcohol/week: 7.0 - 14.0  standard drinks    Types: 7 - 14 Standard drinks or equivalent per week    Comment: wine everday  . Drug use: No    Home Medications Prior to Admission medications   Medication Sig Start Date End Date Taking? Authorizing Provider  aspirin 81 MG tablet Take 81 mg by mouth daily as needed (takes when out of Toprol). Only taking occas   Yes [provider]  furosemide (LASIX) 40 MG tablet TAKE 1 TABLET (40 MG TOTAL) BY MOUTH 3 (THREE) TIMES DAILY. 01/01/19  Yes [name], [name], MD  metoprolol succinate (TOPROL-XL) 100 MG 24 hr tablet Take 1 tablet (100 mg total) by mouth 2 (two) times daily. Take with or immediately following a meal. 06/19/19  Yes [name], [name], MD  diclofenac (VOLTAREN) 75 MG EC tablet Take 75 mg by mouth 2 (two) times daily as needed. 03/28/19   [provider]  Ibuprofen 200 MG CAPS Take 200-800 mg by mouth  every 6 (six) hours as needed (headache or pain).     [provider]  metolazone (ZAROXOLYN) 2.5 MG tablet Take by mouth as directed. 12/15/16   Erlene Quan, PA-C  nitroGLYCERIN (NITROSTAT) 0.4 MG SL tablet 1 sublingual every 5 minutes 3 times if chest pain 09/23/10   Colon Branch, MD    Allergies    Patient has no known allergies.  Review of Systems   Review of Systems  Constitutional: Negative for chills and fever.  HENT: Negative for congestion, rhinorrhea and sore throat.   Eyes: Negative for visual disturbance.  Respiratory: Positive for shortness of breath and wheezing. Negative for cough.   Cardiovascular: Positive for leg swelling. Negative for chest pain.  Gastrointestinal: Negative for abdominal pain, diarrhea, nausea and vomiting.  Genitourinary: Negative for dysuria.  Musculoskeletal: Negative for back pain and neck pain.  Skin: Positive for wound. Negative for rash.  Neurological: Negative for dizziness, light-headedness and headaches.  Hematological: Does not bruise/bleed easily.  Psychiatric/Behavioral: Negative for confusion.    Physical Exam Updated Vital Signs BP 111/77   Pulse 64   Resp 18   Ht 1.626 m (5' 4" )   Wt 88.2 kg   SpO2 91%   BMI 33.39 kg/m   Physical Exam Vitals and nursing note reviewed.  Constitutional:      General: She is not in acute distress.    Appearance: Normal appearance. She is well-developed.  HENT:     Head: Normocephalic and atraumatic.  Eyes:     Extraocular Movements: Extraocular movements intact.     Conjunctiva/sclera: Conjunctivae normal.     Pupils: Pupils are equal, round, and reactive to light.  Cardiovascular:     Rate and Rhythm: Normal rate and regular rhythm.     Heart sounds: No murmur heard.   Pulmonary:     Effort: Pulmonary effort is normal. No respiratory distress.     Breath sounds: Wheezing present.     Comments: Bilateral wheezing.  No respiratory distress.  No use of accessory muscles.  Air  oxygen sat is 95%. Abdominal:     Palpations: Abdomen is soft.     Tenderness: There is no abdominal tenderness.  Musculoskeletal:        General: Swelling and signs of injury present. Normal range of motion.     Cervical back: Normal range of motion and neck supple.     Comments: Patient left leg anterior shin with a 1 cm skin abrasion ulcer healing.  Old.  And then superficial  abrasion laceration fall type abrasions to the lower part of the leg.  No active bleeding.  Wounds clean.  Skin:    General: Skin is warm and dry.     Capillary Refill: Capillary refill takes less than 2 seconds.  Neurological:     General: No focal deficit present.     Mental Status: She is alert and oriented to person, place, and time.     Cranial Nerves: No cranial nerve deficit.     Sensory: No sensory deficit.     Motor: No weakness.     ED Results / Procedures / Treatments   Labs (all labs ordered are listed, but only abnormal results are displayed) Labs Reviewed  CBC - Abnormal; Notable for the following components:      Result Value   RBC 3.78 (*)    MCV 101.9 (*)    All other components within normal limits  BASIC METABOLIC PANEL - Abnormal; Notable for the following components:   Potassium 3.4 (*)    Glucose, Bld 110 (*)    BUN 25 (*)    Creatinine, Ser 1.08 (*)    GFR calc non Af Amer 53 (*)    All other components within normal limits    EKG EKG Interpretation  Date/Time:  Thursday July 18 2019 11:48:21 EDT Ventricular Rate:  63 PR Interval:    QRS Duration: 111 QT Interval:  450 QTC Calculation: 461 R Axis:   5 Text Interpretation: Sinus rhythm Left atrial enlargement Left ventricular hypertrophy Confirmed by Fredia Sorrow [phone]) on 07/18/2019 12:04:03 PM   Radiology DG Chest 2 View  Result Date: 07/18/2019 CLINICAL DATA:  Pain following fall EXAM: CHEST - 2 VIEW COMPARISON:  January 29, 2013 FINDINGS: There is mild left midlung atelectasis. Lungs elsewhere are clear. Heart  is upper normal in size with pulmonary vascularity normal. No adenopathy. No pneumothorax. There is degenerative change in the thoracic spine. IMPRESSION: Mild left midlung atelectasis. Lungs otherwise clear. Heart upper normal in size. No pneumothorax. Electronically Signed   By: Lowella Grip III M.D.   On: 07/18/2019 13:07    Procedures Procedures (including critical care time)  Medications Ordered in ED Medications - No data to display  ED Course  I have reviewed the triage vital signs and the nursing notes.  Pertinent labs & imaging results that were available during my care of the patient were reviewed by me and considered in my medical decision making (see chart for details).    MDM Rules/Calculators/A&P                          Patient will be treated with albuterol inhaler for the wheezing.  Oxygen saturations on room air are normal.  Mid 90s or above.  Chest x-ray without acute findings.  Labs without significant abnormality.  EKG without acute changes.  Patient stable for discharge home we will have her use albuterol inhaler 2 puffs every 6 hours for a week.  Have her follow back up with her cardiologist.   Final Clinical Impression(s) / ED Diagnoses Final diagnoses:  Fall, initial encounter  Abrasion    Rx / DC Orders ED Discharge Orders    None       Fredia Sorrow, MD 07/18/19 1428    Fredia Sorrow, MD 07/18/19 1435

## 2019-07-18 NOTE — ED Triage Notes (Addendum)
Patient was chasing her dog and fell this morning. Numbness to left leg voiced.

## 2019-08-16 ENCOUNTER — Telehealth: Payer: Self-pay | Admitting: Cardiovascular Disease

## 2019-08-16 NOTE — Telephone Encounter (Signed)
FMLA paper work was left in [employer] on 08/16/2019.

## 2019-08-21 ENCOUNTER — Telehealth: Payer: Self-pay | Admitting: Cardiovascular Disease

## 2019-08-21 NOTE — Telephone Encounter (Signed)
New Message  Patient is returning two missed calls. Please give patient a call back if called.

## 2019-08-21 NOTE — Telephone Encounter (Signed)
Spoke to patient she stated she already spoke to [place] in medical records.She will be coming to office to sign a release.

## 2019-09-28 ENCOUNTER — Other Ambulatory Visit: Payer: Self-pay | Admitting: Cardiovascular Disease

## 2020-04-17 ENCOUNTER — Telehealth: Payer: Self-pay | Admitting: Cardiovascular Disease

## 2020-04-17 NOTE — Telephone Encounter (Addendum)
Call placed to the patient to see if she could come in today to see Dr. Royann Shivers. She stated that she could not due to work. She expressed her appreciation for the call to check on her and the offer of an appointment. She stated that she was feeling fine now but is very stressed with work and could not get off for an appointment. If she had a chance she would call back to set up an appointment. She has been advised to ask for this nurse so that I can add her in hopefully. ED precautions were given.   Have still been unable to get in touch with her PCP.

## 2020-04-17 NOTE — Telephone Encounter (Signed)
Patient c/o Palpitations:  High priority if patient c/o lightheadedness, shortness of breath, or chest pain  1) How long have you had palpitations/irregular HR/ Afib? Are you having the symptoms now?  Sydney with [facility] states the patient was seen by them yesterday around 5:00 PM. She had an EKG and they discovered she was in afib. Sherron Ales is not currently with the patient, unsure whether she is currently in afib.   2) Are you currently experiencing lightheadedness, SOB or CP?  No   3) Do you have a history of afib (atrial fibrillation) or irregular heart rhythm?  Gwendalyn Ege assumes it is new onset afib States she doesn't see an afib dx in her chart  4) Have you checked your BP or HR? (document readings if available):  04/17/20: 148/98 85  5) Are you experiencing any other symptoms?  No    Pt c/o Syncope: STAT if syncope occurred within 30 minutes and pt complains of lightheadedness High Priority if episode of passing out, completely, today or in last 24 hours   1. Did you pass out today?  No    2. When is the last time you passed out?  04/12/20  3. Has this occurred multiple times?  Yes    4. Did you have any symptoms prior to passing out? Sherron Ales assumes it occurs when patient is under stress or  With exertion

## 2020-04-17 NOTE — Telephone Encounter (Signed)
Can we please get that ECG faxed to [place]? She needs an appointment please.As soon as we can get her in.

## 2020-04-17 NOTE — Telephone Encounter (Signed)
Called patient to discuss the issues that were presented by her PCP from yesterday.   Patient states she feels fine today, she is at work currently. She does not have a way to check her HR or BP at home, I did offer to have her come in for a EKG nurse visit to see what she was in today or next week, but patient is unable to do that due to working two jobs and having to check her schedule for next week. She would call back to schedule for anytime next week for either an appointment to be evaluated as she is overdue for follow up or to have a nurse visit EKG.   Patient has had no other episodes of passing out since the 03/27 episode. Patient states she can not talk right now due to being at work but assured me she was feeling fine at this time.   Will route to MD and nurse to make aware.

## 2020-04-17 NOTE — Telephone Encounter (Signed)
Attempted to call the number provider for [place] from PCP. The number went to an undisclosed cell phone.   Tried calling the PCP office listed and was put on hold for 20 minutes with no answer.

## 2020-04-22 NOTE — Telephone Encounter (Signed)
EKG will be faxed to the office.

## 2020-04-22 NOTE — Telephone Encounter (Signed)
Attempted to reach the primary care office. Have been unable to get through.

## 2020-04-22 NOTE — Telephone Encounter (Signed)
Left a message for [place] to call back to get a copy of the EKG.

## 2020-04-24 MED ORDER — APIXABAN 5 MG PO TABS
5.0000 mg | ORAL_TABLET | Freq: Two times a day (BID) | ORAL | 11 refills | Status: DC
Start: 2020-04-24 — End: 2021-03-29

## 2020-04-24 NOTE — Telephone Encounter (Signed)
Received ECG - clearly shows AFib. Recommend starting an anticoagulant - for example Eliquis 5 mg twice daily. She will probably need to apply for a patient assistance program. Would like to see her back in office.

## 2020-04-24 NOTE — Addendum Note (Signed)
Addended by: Sandi Mariscal on: 04/24/2020 02:08 PM   Modules accepted: Orders

## 2020-04-24 NOTE — Telephone Encounter (Addendum)
Patient has been made aware and will start the Eliquis. She stated that she will call back for an appointment. She was currently on the side of the road with a broken down car.   She has been made aware to not take the Aspirin or NSAIDS when on the Eliquis.

## 2020-05-17 ENCOUNTER — Other Ambulatory Visit: Payer: Self-pay | Admitting: Cardiovascular Disease

## 2020-05-20 ENCOUNTER — Other Ambulatory Visit: Payer: Self-pay | Admitting: Cardiovascular Disease

## 2020-08-26 ENCOUNTER — Other Ambulatory Visit: Payer: Self-pay | Admitting: Cardiovascular Disease

## 2020-09-09 ENCOUNTER — Other Ambulatory Visit: Payer: Self-pay | Admitting: Cardiovascular Disease

## 2020-11-27 IMAGING — CR DG CHEST 2V
2 series · 2 of 2 positions shown · non-contrast
Comparison: January 29, 2013

CLINICAL DATA: Pain following fall

EXAM:
CHEST - 2 VIEW

[w chest pa]
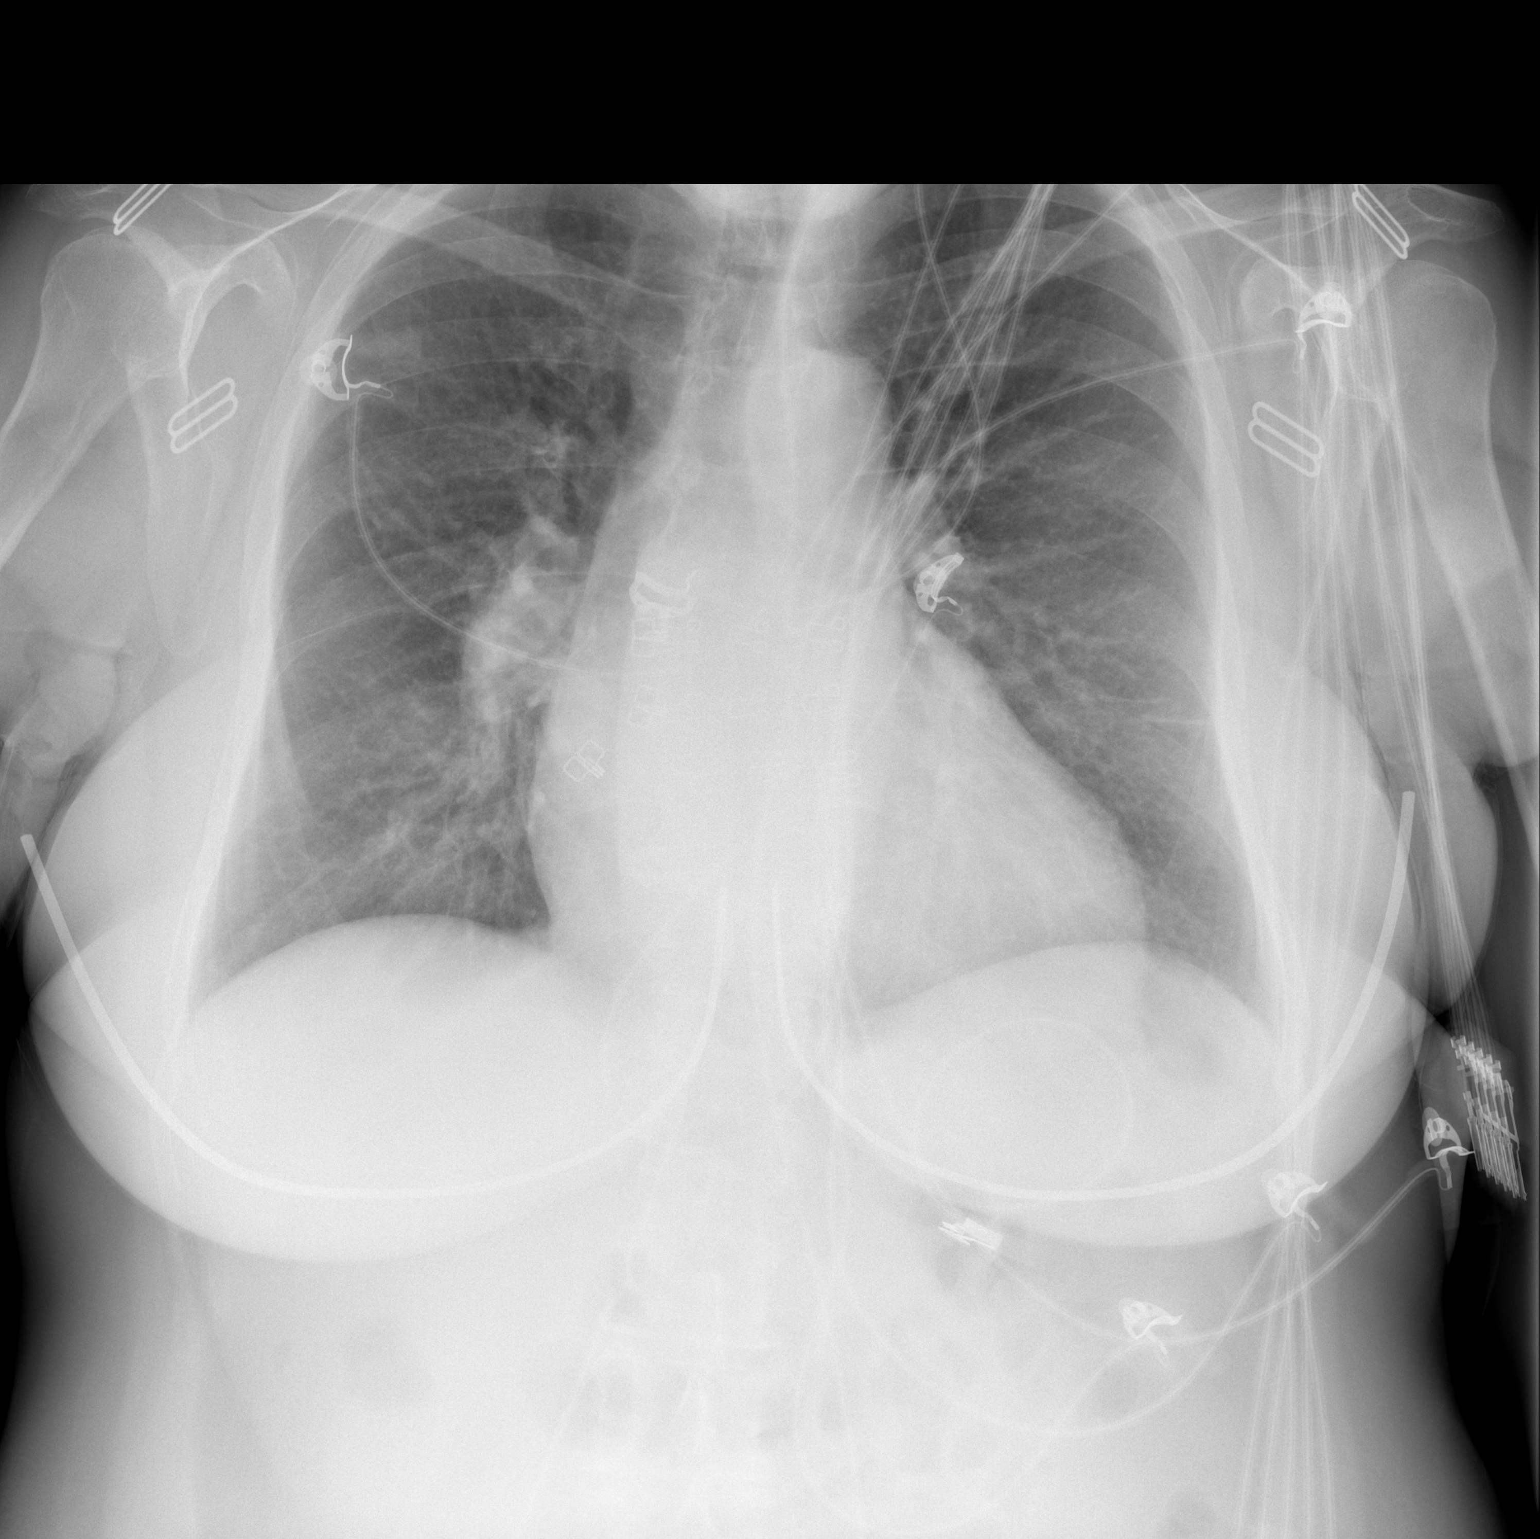

[w chest lat]
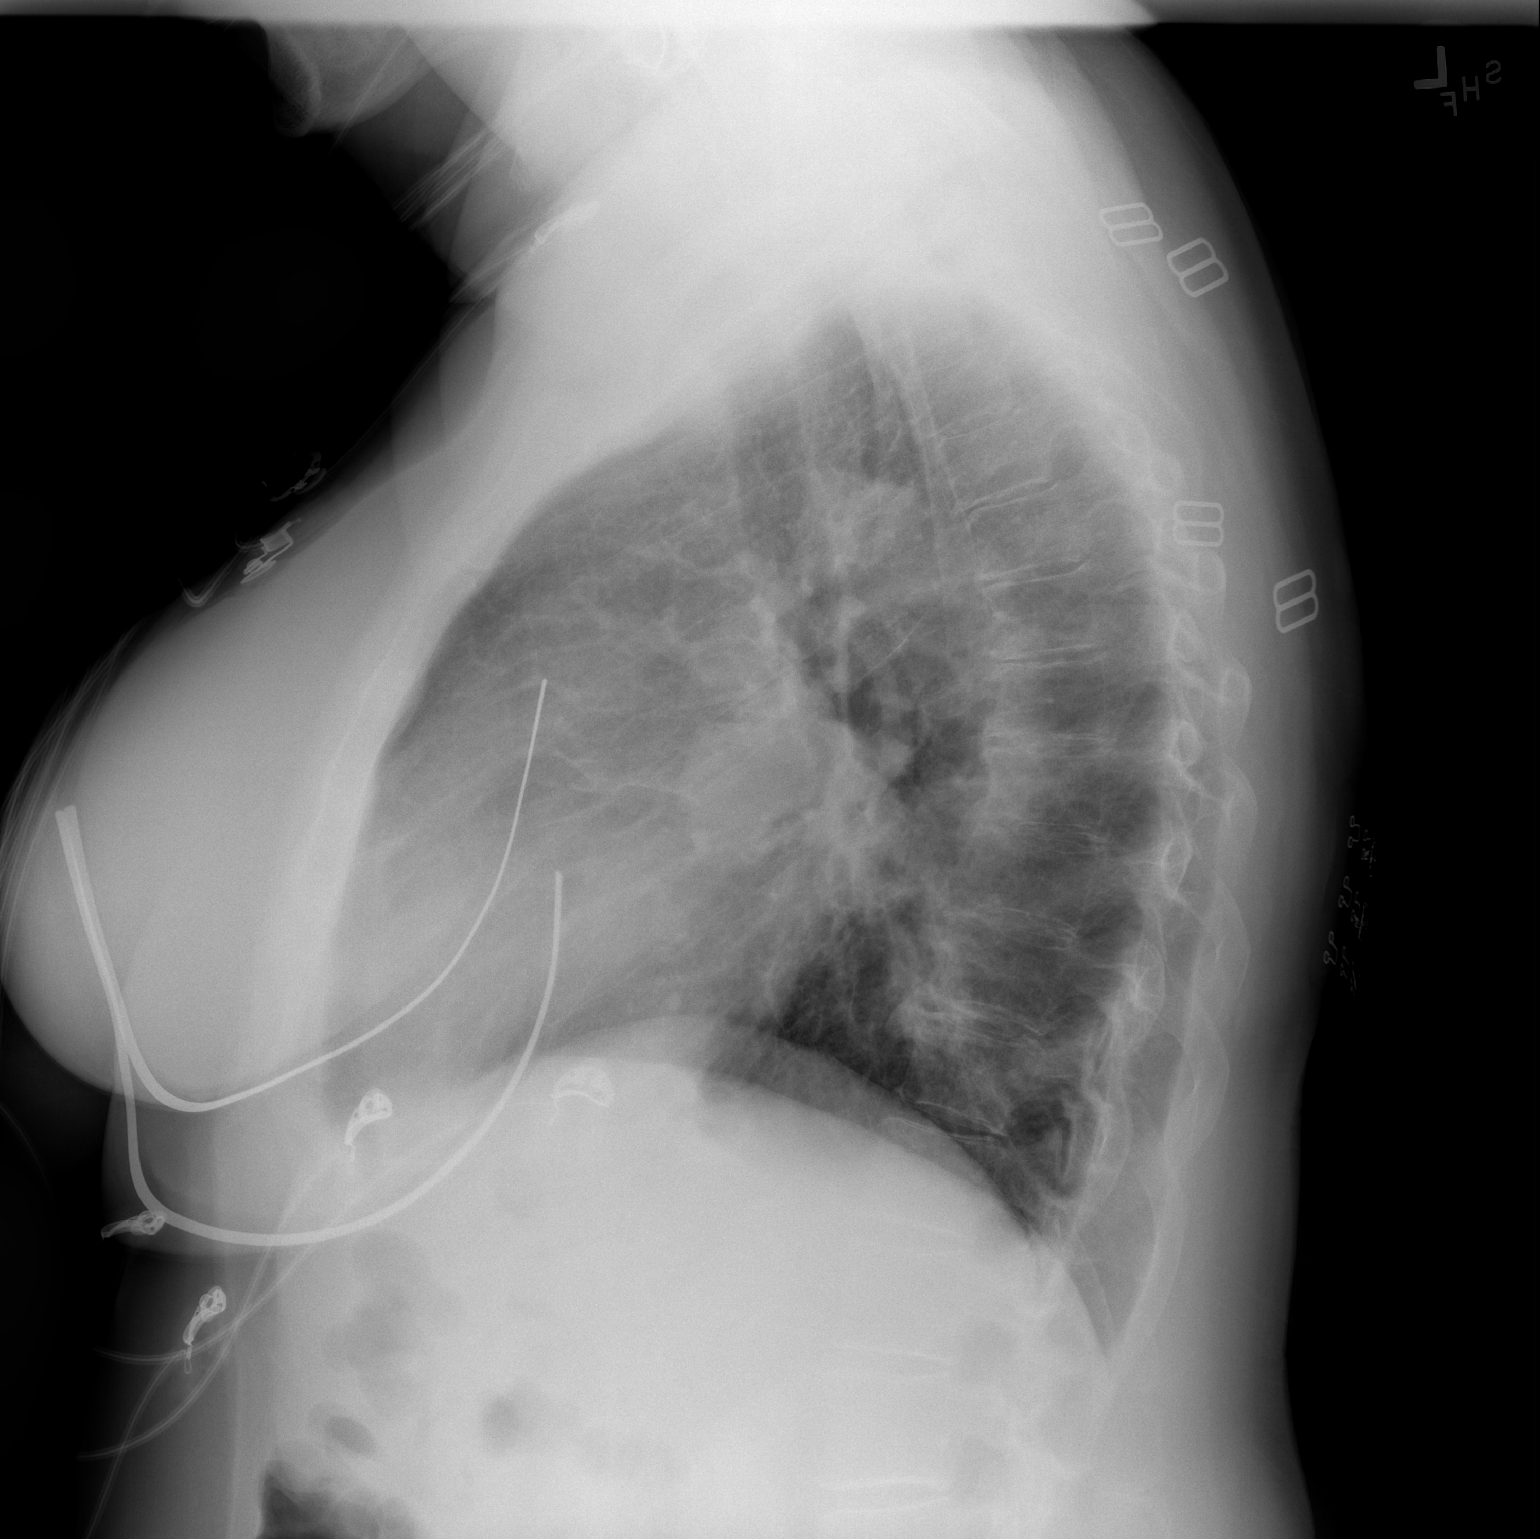

[2 of 2 positions shown; findings below may reference images not displayed]

FINDINGS: There is mild left midlung atelectasis. Lungs elsewhere are clear.
Heart is upper normal in size with pulmonary vascularity normal. No
adenopathy. No pneumothorax. There is degenerative change in the
thoracic spine.
IMPRESSION: Mild left midlung atelectasis. Lungs otherwise clear. Heart upper
normal in size. No pneumothorax.

## 2020-12-16 ENCOUNTER — Ambulatory Visit: Payer: Medicare Other | Admitting: Cardiovascular Disease

## 2021-01-19 ENCOUNTER — Other Ambulatory Visit: Payer: Self-pay | Admitting: Cardiovascular Disease

## 2021-02-26 ENCOUNTER — Other Ambulatory Visit: Payer: Self-pay | Admitting: Cardiovascular Disease

## 2021-03-01 ENCOUNTER — Encounter: Payer: Self-pay | Admitting: Cardiovascular Disease

## 2021-03-01 NOTE — Telephone Encounter (Signed)
Error

## 2021-03-15 ENCOUNTER — Other Ambulatory Visit: Payer: Self-pay | Admitting: Cardiovascular Disease

## 2021-03-26 NOTE — Progress Notes (Signed)
Office Visit    Patient Name: Christine Jacobson Date of Encounter: 03/29/2021  Primary Care Provider:  Patient, No Pcp Per (Inactive) Primary Cardiologist:  Sanda Klein, MD  Chief Complaint    66 year old female with a history of hypertrophic obstructive cardiomyopathy, chronic diastolic heart failure, paroxysmal atrial fibrillation, hypertension, hyperlipidemia, palpitations, vasovagal syncope, and obesity who presents for follow-up related to heart failure/HOCM and atrial fibrillation.  Past Medical History    Past Medical History:  Diagnosis Date   Chest pain 11/22/2010   2D STRESS ECHO - EF 60%, peak stress EF 80%, normal, no evidence for stress-induced ischemia   HOCM (hypertrophic obstructive cardiomyopathy) ([place]) 06/21/2011   2D ECHO - EF >55%, normal   HTN (hypertension)    Hyperprolactinemia ([facility])    dx in her 18, took meds, self d/c a while back   Liver function test abnormality    normal when repeated   Mild hyperlipidemia    Obesity    Palpitations    negative stress echo in November 2012 with normal LV function; mild LVH, proximal septal thickening with narrow LVOT and mild gradient; mild MR and TR; Cardionet showed PACs in November 2012   Pyoderma gangrenosa    Shortness of breath 07/11/2011   MET TEST   Past Surgical History:  Procedure Laterality Date   SKIN GRAFT  2006   porcine, R leg    Allergies  No Known Allergies  History of Present Illness    66 year old female with the above past medical history including hypertrophic obstructive cardiomyopathy, chronic diastolic heart failure, paroxysmal atrial fibrillation, hypertension, hyperlipidemia, palpitations, vasovagal syncope, and obesity.  She had a normal stress echo in 2012 in the setting of chest pain and palpitations. Cardiopulmonary stress test in 2013 was also normal. She has not tolerated more than a small dose of beta-blocker in the past due to significant fatigue, bradycardia. She previously  declined coronary CTA.  Echocardiogram in 2017 showed severe basal septal hypertrophy (2.5 cm) and LV outflow tract obstruction with a resting gradient of 64 mmHg increasing to 144 mmHg following Valsalva maneuver, mild mitral valve regurgitation, findings consistent with hypertrophic obstructive cardiomyopathy.  She saw Dr. Sallyanne Kuster in 2018 with complaints of lower extremity edema, weight gain and was started on Lasix. She has a history of syncopal episodes, however, these were thought to be vasovagal in nature. Most recent echocardiogram in July 2019 showed EF 65-70%, severe LVH, wall thickness, vigorous systolic function, consistent with pseudonormal LV filling pattern, DDD, mild mitral valve regurgitation, severe LAE, mild RAE, mildly increased PA pressure, 38 mmHg.  She saw Dr. Sallyanne Kuster in December 2020 and loop recorder was recommended given history of syncope, however, this was never completed. She was last seen virtually on 06/26/2019 and reported self-adjusting her beta blocker. She saw her PCP on 04/16/2020. EKG at the time showed rate controlled atrial fibrillation. She was asymptomatic.  She called our office to notify us, and was subsequently started on Eliquis, and advised to follow-up as an outpatient.  She has not been seen in the office since.    She presents today for follow-up. Since her last visit she reports an increased dyspnea on exertion, orthopnea, weight gain, and bilateral lower extremity edema. She states her weight has gone up approximately 30 pounds in the past 2 months. EKG today shows rate controlled atrial fibrillation, 73 bpm. She states she was unaware that she was supposed to start taking Eliquis back in April 2022.  She reports  intermittent palpitations, denies presyncope, syncope.  She has been self-titrating her metoprolol, taking 100 mg twice daily, but occasionally takes it 3 times daily when she "feels bad."  She denies symptoms concerning for angina.  She states she knows  she should do better to take care of herself, but finds this difficult, and doesn't listen to her body. However, she decided to follow-up now that she is feeling so poorly.  Home Medications    Current Outpatient Medications  Medication Sig Dispense Refill   furosemide (LASIX) 80 MG tablet Take 1 tablet (80 mg total) by mouth daily. 30 tablet 1   Ibuprofen 200 MG CAPS Take 200-800 mg by mouth every 6 (six) hours as needed (headache or pain).      metoprolol succinate (TOPROL-XL) 100 MG 24 hr tablet TAKE ONE TABLET BY MOUTH TWICE A DAY WITH OR IMMEDIATELY FOLLOWING A MEAL 180 tablet 0   nitroGLYCERIN (NITROSTAT) 0.4 MG SL tablet 1 sublingual every 5 minutes 3 times if chest pain 20 tablet 1   potassium chloride SA (KLOR-CON M) 20 MEQ tablet Take 1 tablet (20 mEq total) by mouth daily. 30 tablet 1   No current facility-administered medications for this visit.     Review of Systems    She denies chest pain, pnd, n, v, dizziness, syncope, or early satiety. All other systems reviewed and are otherwise negative except as noted above.   Physical Exam    VS:  BP (!) 150/90 (BP Location: Left Arm, Patient Position: Sitting, Cuff Size: Normal)    Pulse 89    Resp 20    Ht 5' 4" (1.626 m)    Wt 212 lb (96.2 kg)    SpO2 96%    BMI 36.39 kg/m   GEN: Well nourished, well developed, in no acute distress. HEENT: normal. Neck: Supple, no JVD, carotid bruits, or masses. Cardiac: RRR, 2/6 systolic murmur, no rubs, or gallops. No clubbing, cyanosis, 1+ pitting bilateral lower extremity edema.  Radials/DP/PT 2+ and equal bilaterally.  Respiratory:  Respirations regular and unlabored, clear to auscultation bilaterally. GI: Soft, nontender, nondistended, BS + x 4. MS: no deformity or atrophy. Skin: warm and dry, no rash. Neuro:  Strength and sensation are intact. Psych: Normal affect.  Accessory Clinical Findings    ECG personally reviewed by me today - Atrial fibrillation, 73 bpm - no acute  changes.  Lab Results  Component Value Date   WBC 8.7 07/18/2019   HGB 12.5 07/18/2019   HCT 38.5 07/18/2019   MCV 101.9 (H) 07/18/2019   PLT 227 07/18/2019   Lab Results  Component Value Date   CREATININE 1.08 (H) 07/18/2019   BUN 25 (H) 07/18/2019   NA 143 07/18/2019   K 3.4 (L) 07/18/2019   CL 106 07/18/2019   CO2 25 07/18/2019   Lab Results  Component Value Date   ALT 128 (H) 05/26/2011   AST 83 (H) 05/26/2011   ALKPHOS 111 05/26/2011   BILITOT 0.7 05/26/2011   Lab Results  Component Value Date   CHOL 229 (H) 05/26/2011   HDL 100.90 05/26/2011   LDLDIRECT 121.8 05/26/2011   TRIG 59.0 05/26/2011   CHOLHDL 2 05/26/2011    No results found for: HGBA1C  Assessment & Plan    1. Hypertrophic obstructive cardiomyopathy:  Most recent echo in July 2019 showed EF 65-70%, severe LVH, wall thickness, vigorous systolic function, consistent with pseudonormal LV filling pattern, DDD, mild mitral valve regurgitation, severe LAE, mild RAE, mildly increased PA  pressure, 38 mmHg.  Murmur on exam.  She denies presyncope, syncope. She is volume overloaded on exam today as below. Will repeat echocardiogram. Continue Metoprolol, lasix 80 mg bid as below.  Caution with overdiuresis and risk of syncope.  Recommend she avoid excessive physical exertion at this time.  Case discussed with Dr. Sallyanne Kuster, primary cardiologist.  Following repeat echo and follow-up with Dr. Sallyanne Kuster, consider referral to Dr. Ezra Sites for possible consideration of [place].   2. Chronic diastolic heart failure: Recent echo as above. She is volume overloaded on exam today.  She reports dyspnea on exertion, orthopnea, weight gain, bilateral lower extremity edema, has worsened significantly over the past week.  Repeat echo pending as above.  Will increase Lasix to 80 mg twice daily.  Start potassium 20 mEq daily.  Check BMET today, repeat BMET in 1 week with increased Lasix dose.   3. Persistent atrial fibrillation:  Diagnosed in March 2022. Rate controlled. CHA2DS2VASc = 4.  She states she did not know she was supposed to start taking Eliquis, and never started this medication. EKG today shows rate controlled atrial fibrillation.  Uncertain how long she has been in atrial fibrillation. Goal at this point is rate control and anticoagulation. Restart Eliquis, continue metoprolol. BMET today, repeat BMET, CBC in 1 week.   4. Hypertension: BP above goal.  She declines home BP checks. Continue current antihypertensive regimen with increased Lasix as above.     5. Hyperlipidemia: No recent LDL on file. Unfortunately, we did not have time to discuss this in office today. Consider repeat lipids at follow-up visit.   6. Disposition: Follow-up in 2 weeks, follow-up with Dr. Sallyanne Kuster as soon as possible.  [name], NP 03/29/2021, 9:30 AM

## 2021-03-29 ENCOUNTER — Encounter: Payer: Self-pay | Admitting: Nurse Practitioner

## 2021-03-29 ENCOUNTER — Ambulatory Visit: Payer: Medicare Other | Admitting: Nurse Practitioner

## 2021-03-29 ENCOUNTER — Other Ambulatory Visit: Payer: Self-pay | Admitting: Cardiovascular Disease

## 2021-03-29 ENCOUNTER — Other Ambulatory Visit: Payer: Self-pay

## 2021-03-29 VITALS — BP 150/90 | HR 89 | Resp 20 | Ht 64.0 in | Wt 212.0 lb

## 2021-03-29 DIAGNOSIS — I4811 Longstanding persistent atrial fibrillation: Secondary | ICD-10-CM | POA: Diagnosis not present

## 2021-03-29 DIAGNOSIS — R0602 Shortness of breath: Secondary | ICD-10-CM

## 2021-03-29 DIAGNOSIS — I5032 Chronic diastolic (congestive) heart failure: Secondary | ICD-10-CM

## 2021-03-29 DIAGNOSIS — I4891 Unspecified atrial fibrillation: Secondary | ICD-10-CM

## 2021-03-29 DIAGNOSIS — I10 Essential (primary) hypertension: Secondary | ICD-10-CM

## 2021-03-29 DIAGNOSIS — E782 Mixed hyperlipidemia: Secondary | ICD-10-CM

## 2021-03-29 DIAGNOSIS — I421 Obstructive hypertrophic cardiomyopathy: Secondary | ICD-10-CM

## 2021-03-29 LAB — CBC
Hematocrit: 38.3 % (ref 34.0–46.6)
Hemoglobin: 13.2 g/dL (ref 11.1–15.9)
MCH: 32.8 pg (ref 26.6–33.0)
MCHC: 34.5 g/dL (ref 31.5–35.7)
MCV: 95 fL (ref 79–97)
Platelets: 199 10*3/uL (ref 150–450)
RBC: 4.02 x10E6/uL (ref 3.77–5.28)
RDW: 13.8 % (ref 11.7–15.4)
WBC: 7.5 10*3/uL (ref 3.4–10.8)

## 2021-03-29 MED ORDER — POTASSIUM CHLORIDE CRYS ER 20 MEQ PO TBCR: 20.0000 meq | EXTENDED_RELEASE_TABLET | Freq: Every day | ORAL | 1 refills | Status: DC

## 2021-03-29 MED ORDER — FUROSEMIDE 80 MG PO TABS: 80.0000 mg | ORAL_TABLET | Freq: Every day | ORAL | 1 refills | Status: DC

## 2021-03-29 NOTE — Patient Instructions (Addendum)
Medication Instructions:  ?STOP Asprin 81 mg ?Start Eliquis 5 mg twice daily ?Start Potassium 20 meq daily ?Increase Lasix to 80 mg twice daily  ? ?*If you need a refill on your cardiac medications before your next appointment, please call your pharmacy* ? ? ?Lab Work: ?Your physician recommends that you complete labs today and return in 1 week for labs ?BMET(today) ?BMET & CBC (1 week) ? ?If you have labs (blood work) drawn today and your tests are completely normal, you will receive your results only by: ?MyChart Message (if you have MyChart) OR ?A paper copy in the mail ?If you have any lab test that is abnormal or we need to change your treatment, we will call you to review the results. ? ? ?Testing/Procedures: ?Your physician has requested that you have an echocardiogram. Echocardiography is a painless test that uses sound waves to create images of your heart. It provides your doctor with information about the size and shape of your heart and how well your heart?s chambers and valves are working. This procedure takes approximately one hour. There are no restrictions for this procedure.  ? ? ?Follow-Up: ?At [facility], you and your health needs are our priority.  As part of our continuing mission to provide you with exceptional heart care, we have created designated Provider Care Teams.  These Care Teams include your primary Cardiologist (physician) and Advanced Practice Providers (APPs -  Physician Assistants and Nurse Practitioners) who all work together to provide you with the care you need, when you need it. ? ?We recommend signing up for the patient portal called "MyChart".  Sign up information is provided on this After Visit Summary.  MyChart is used to connect with patients for Virtual Visits (Telemedicine).  Patients are able to view lab/test results, encounter notes, upcoming appointments, etc.  Non-urgent messages can be sent to your provider as well.   ?To learn more about what you can do with  MyChart, go to [email or web address].   ? ?Your next appointment:   ?2 week(s) ? ?The format for your next appointment:   ?In Person ? ?Provider:   ?Thurmon Fair, MD  or Bernadene Person, NP      ? ? ? ?

## 2021-03-30 ENCOUNTER — Telehealth: Payer: Self-pay | Admitting: Cardiovascular Disease

## 2021-03-30 LAB — BASIC METABOLIC PANEL
BUN/Creatinine Ratio: 20 (ref 12–28)
BUN: 23 mg/dL (ref 8–27)
CO2: 22 mmol/L (ref 20–29)
Calcium: 9.9 mg/dL (ref 8.7–10.3)
Chloride: 104 mmol/L (ref 96–106)
Creatinine, Ser: 1.13 mg/dL — ABNORMAL HIGH (ref 0.57–1.00)
Glucose: 105 mg/dL — ABNORMAL HIGH (ref 70–99)
Potassium: 4.4 mmol/L (ref 3.5–5.2)
Sodium: 143 mmol/L (ref 134–144)
eGFR: 53 mL/min/{1.73_m2} — ABNORMAL LOW (ref >59)

## 2021-03-30 MED ORDER — APIXABAN 5 MG PO TABS: 5.0000 mg | ORAL_TABLET | Freq: Two times a day (BID) | ORAL | 5 refills | Status: DC

## 2021-03-30 NOTE — Telephone Encounter (Signed)
Pt c/o medication issue: ? ?1. Name of Medication: Eliquis  ? ?2. How are you currently taking this medication (dosage and times per day)?   ? ?3. Are you having a reaction (difficulty breathing--STAT)? no ? ?4. What is your medication issue? Patient states she is supposed to be taking Eliquis but it wasn't called in to the pharmacy. Please advise ? ?

## 2021-03-30 NOTE — Telephone Encounter (Signed)
Prescription refill request for Eliquis received. ?Indication: afib  ?Last office visit: 03/29/2021, [place] ?Scr: 1.13, 03/29/2021 ?Age: 66 yo   ?Weight: 96.3 kg  ? ? ?Called and spoke to pt and she would like prescription sent to [facility]. Refill sent.   ? ? ? From [place] Monge's note from 3/13:  ? ?Restart Eliquis, continue metoprolol. BMET today, repeat BMET, CBC in 1 week.  ? ?Medication Instructions:  ?STOP Asprin 81 mg ?Start Eliquis 5 mg twice daily ?Start Potassium 20 meq daily ?Increase Lasix to 80 mg twice daily  ?  ?

## 2021-03-31 ENCOUNTER — Telehealth: Payer: Self-pay

## 2021-03-31 NOTE — Telephone Encounter (Addendum)
Spoke with pt. Pt was notified of results. ---- Message from Joylene Grapes, NP sent at 03/30/2021  1:17 PM EDT ----- ?Recent labs show stable kidney function and electrolytes.  Continue current medications and follow-up as planned.  Thank you. ?

## 2021-04-06 ENCOUNTER — Telehealth: Payer: Self-pay | Admitting: Nurse Practitioner

## 2021-04-06 NOTE — Telephone Encounter (Signed)
Docusate sodium ?

## 2021-04-06 NOTE — Telephone Encounter (Signed)
Informed patient I will forward her question to PharmD. ?

## 2021-04-06 NOTE — Telephone Encounter (Signed)
New Message: ? ? ? ? ?Patient says she is constipated. She wants to know the name of a Stool Softener Ser that she can take and will not  interfere with her other medicine please. ?

## 2021-04-07 NOTE — Telephone Encounter (Signed)
Spoke with patient and explained colace. She said she asked her pharmacist yesterday and was told the same medication. She asked if her medications could be making her constipated. I explained medications can affect people differently regarding side effects. We discussed low sodium/salt diet and high fiber diet. She asked for a sooner appointment with Dr. Royann Shivers. Patient agreed to 4/14 at 9:30 ?

## 2021-04-09 ENCOUNTER — Other Ambulatory Visit (HOSPITAL_COMMUNITY): Payer: Medicare Other

## 2021-04-16 ENCOUNTER — Telehealth (HOSPITAL_COMMUNITY): Payer: Self-pay | Admitting: Nurse Practitioner

## 2021-04-16 NOTE — Telephone Encounter (Signed)
Patient cancelled echocardiogram for reason below: ? ?04/09/2021 12:52 PM LO:VFIEP, SHAWANA A  ?Cancel Rsn: Patient (sick) ? ?Order will be removed from the Echo wq and when patient calls back to reschedule we will reinstate the order.  ?

## 2021-04-30 ENCOUNTER — Ambulatory Visit: Payer: Medicare Other | Admitting: Cardiovascular Disease

## 2021-04-30 ENCOUNTER — Encounter: Payer: Self-pay | Admitting: Cardiovascular Disease

## 2021-04-30 VITALS — BP 121/90 | HR 71 | Ht 64.0 in | Wt 214.0 lb

## 2021-04-30 DIAGNOSIS — I10 Essential (primary) hypertension: Secondary | ICD-10-CM

## 2021-04-30 DIAGNOSIS — I5032 Chronic diastolic (congestive) heart failure: Secondary | ICD-10-CM | POA: Diagnosis not present

## 2021-04-30 DIAGNOSIS — I4819 Other persistent atrial fibrillation: Secondary | ICD-10-CM

## 2021-04-30 DIAGNOSIS — I421 Obstructive hypertrophic cardiomyopathy: Secondary | ICD-10-CM

## 2021-04-30 DIAGNOSIS — Z87898 Personal history of other specified conditions: Secondary | ICD-10-CM

## 2021-04-30 NOTE — Patient Instructions (Signed)
Medication Instructions:  No changes *If you need a refill on your cardiac medications before your next appointment, please call your pharmacy*   Lab Work: None ordered If you have labs (blood work) drawn today and your tests are completely normal, you will receive your results only by: MyChart Message (if you have MyChart) OR A paper copy in the mail If you have any lab test that is abnormal or we need to change your treatment, we will call you to review the results.   Testing/Procedures: None ordered   Follow-Up: At [facility], you and your health needs are our priority.  As part of our continuing mission to provide you with exceptional heart care, we have created designated Provider Care Teams.  These Care Teams include your primary Cardiologist (physician) and Advanced Practice Providers (APPs -  Physician Assistants and Nurse Practitioners) who all work together to provide you with the care you need, when you need it.  We recommend signing up for the patient portal called "MyChart".  Sign up information is provided on this After Visit Summary.  MyChart is used to connect with patients for Virtual Visits (Telemedicine).  Patients are able to view lab/test results, encounter notes, upcoming appointments, etc.  Non-urgent messages can be sent to your provider as well.   To learn more about what you can do with MyChart, go to [email or web address].    Your next appointment:   6 month(s)  The format for your next appointment:   In Person  Provider:   [name] [name], MD {    Important Information About Sugar       

## 2021-05-02 ENCOUNTER — Encounter: Payer: Self-pay | Admitting: Cardiovascular Disease

## 2021-05-02 NOTE — Progress Notes (Signed)
?Cardiology Office Note:   ? ?Date:  05/02/2021  ? ?IDSyrenity Jacobson, DOB 07/08/1955, MRN [id number] ? ?PCP:  Patient, No Pcp Per (Inactive) ?  ?[facility] Providers ?Cardiologist:  Sanda Klein, MD    ? ?Referring MD: No ref. provider found  ? ?Chief Complaint  ?Patient presents with  ? Cardiomyopathy  ? Atrial Fibrillation  ? ? ?History of Present Illness:   ? ?Christine Jacobson is a 66 y.o. female with a hx of hypertrophic obstructive cardiomyopathy, chronic diastolic heart failure, paroxysmal atrial fibrillation, hypertension, hypercholesterolemia, history of syncope, moderate obesity, presenting in follow-up. ? ?Previous echocardiograms have shown resting LVOT gradients as high as 64 mmHg, increasing to 144 mmHg with a Valsalva maneuver, on a background of asymmetric septal hypertrophy.  Doppler has shown evidence of pseudonormal LV filling pattern and mild PAH estimated at 38 mmHg.  Also notable, she had a severely dilated left atrium with an end-systolic diameter of 55 mm. ? ?She has had previous syncope and circumstances that suggested a possible vasovagal mechanism, but quite possibly also related to LV outflow tract obstruction.  We had planned a loop recorder to make sure she is not having arrhythmia, but this has never been completed. ? ?She was incidentally found to have atrial fibrillation when she saw her primary care provider on April 16, 2020.  She is now taking Eliquis and has not had any problems with bleeding.  She has not had any falls or injuries.  When she was seen in our office last month on March 29, 2021 she was in atrial fibrillation with controlled ventricular rate at 73 bpm.  Her rhythm is regular on exam today.  It is quite possible she has been in persistent atrial fibrillation for the last year. ? ?She is taking the metoprolol tartrate 100 mg at least twice daily but occasionally takes an extra dose of metoprolol if she "feels bad".  She denies any interim episodes of syncope.  She  she denies experiencing chest pain either at rest or with activity.  NYHA functional class II exertional dyspnea. ? ?Past Medical History:  ?Diagnosis Date  ? Chest pain 11/22/2010  ? 2D STRESS ECHO - EF 60%, peak stress EF 80%, normal, no evidence for stress-induced ischemia  ? HOCM (hypertrophic obstructive cardiomyopathy) ([place]) 06/21/2011  ? 2D ECHO - EF >55%, normal  ? HTN (hypertension)   ? Hyperprolactinemia ([place])   ? dx in her 19, took meds, self d/c a while back  ? Liver function test abnormality   ? normal when repeated  ? Mild hyperlipidemia   ? Obesity   ? Palpitations   ? negative stress echo in November 2012 with normal LV function; mild LVH, proximal septal thickening with narrow LVOT and mild gradient; mild MR and TR; Cardionet showed PACs in November 2012  ? Pyoderma gangrenosa   ? Shortness of breath 07/11/2011  ? MET TEST  ? ? ?Past Surgical History:  ?Procedure Laterality Date  ? SKIN GRAFT  2006  ? porcine, R leg  ? ? ?Current Medications: ?Current Meds  ?Medication Sig  ? furosemide (LASIX) 80 MG tablet Take 1 tablet (80 mg total) by mouth daily.  ? Ibuprofen 200 MG CAPS Take 200-800 mg by mouth every 6 (six) hours as needed (headache or pain).   ? metoprolol succinate (TOPROL-XL) 100 MG 24 hr tablet TAKE ONE TABLET BY MOUTH TWICE A DAY WITH OR IMMEDIATELY FOLLOWING A MEAL  ? nitroGLYCERIN (NITROSTAT) 0.4 MG SL tablet 1  sublingual every 5 minutes 3 times if chest pain  ? potassium chloride SA (KLOR-CON M) 20 MEQ tablet Take 1 tablet (20 mEq total) by mouth daily.  ?  ? ?Allergies:   Patient has no known allergies.  ? ?Social History  ? ?Socioeconomic History  ? Marital status: Single  ?  Spouse name: Not on file  ? Number of children: 0  ? Years of education: Not on file  ? Highest education level: Not on file  ?Occupational History  ? Occupation: para Statistician, part time cook  ?Tobacco Use  ? Smoking status: Never  ? Smokeless tobacco: Never  ?Substance and Sexual Activity  ? Alcohol use:  Yes  ?  Alcohol/week: 7.0 - 14.0 standard drinks  ?  Types: 7 - 14 Standard drinks or equivalent per week  ?  Comment: wine everday  ? Drug use: No  ? Sexual activity: Not Currently  ?Other Topics Concern  ? Not on file  ?Social History Narrative  ? Lives by self  ? ?Social Determinants of Health  ? ?Financial Resource Strain: Not on file  ?Food Insecurity: Not on file  ?Transportation Needs: Not on file  ?Physical Activity: Not on file  ?Stress: Not on file  ?Social Connections: Not on file  ?  ? ?Family History: ?The patient's family history includes Coronary artery disease in an other family member; Diabetes in an other family member; Emphysema in her father; Heart disease in her mother; Ovarian cancer in her mother. There is no history of Cancer. ? ?ROS:   ?Please see the history of present illness.    ? All other systems reviewed and are negative. ? ?EKGs/Labs/Other Studies Reviewed:   ? ?The following studies were reviewed today: ?Echocardiogram 2019 ?- Left ventricle: The cavity size was normal. Wall thickness was  ?  increased in a pattern of severe LVH. Systolic function was  ?  vigorous. The estimated ejection fraction was in the range of 65%  ?  to 70%. Wall motion was normal; there were no regional wall  ?  motion abnormalities. Features are consistent with a pseudonormal  ?  left ventricular filling pattern, with concomitant abnormal  ?  relaxation and increased filling pressure (grade 2 diastolic  ?  dysfunction).  ?- Aortic valve: Mildly calcified annulus. Mildly thickened  ?  leaflets.  ?- Mitral valve: There was mild regurgitation.  ?- Left atrium: The atrium was severely dilated.  ?- Right atrium: The atrium was mildly dilated.  ?- Pulmonary arteries: Systolic pressure was mildly increased. PA  ?  peak pressure: 38 mm Hg (S).  ? ?EKG:  EKG is not ordered today.  The ekg ordered 03/29/2021 demonstrates atrial fibrillation with controlled ventricular rate, left ventricular hypertrophy with secondary  polarization abnormalities ? ?Recent Labs: ?03/29/2021: BUN 23; Creatinine, Ser 1.13; Hemoglobin 13.2; Platelets 199; Potassium 4.4; Sodium 143  ?Recent Lipid Panel ?   ?Component Value Date/Time  ? CHOL 229 (H) 05/26/2011 2542  ? TRIG 59.0 05/26/2011 0928  ? HDL 100.90 05/26/2011 0928  ? CHOLHDL 2 05/26/2011 0928  ? VLDL 11.8 05/26/2011 0928  ? LDLDIRECT 121.8 05/26/2011 0928  ? ? ? ?Risk Assessment/Calculations:   ?CHA2DS2-VASc score is not relevant in the setting of hypertrophic obstructive cardiomyopathy.  Anticoagulation is indicated regardless of the score. ?CHA2DS2-VASc Score = 4  ? This indicates a 4.8% annual risk of stroke. ?The patient's score is based upon: ?CHF History: 1 ?HTN History: 1 ?Diabetes History: 0 ?Stroke History: 0 ?  Vascular Disease History: 0 ?Age Score: 1 ?Gender Score: 1 ?  ? ? ?    ? ?Physical Exam:   ? ?VS:  BP 121/90   Pulse 71   Ht 5' 4"  (1.626 m)   Wt 214 lb (97.1 kg)   SpO2 98%   BMI 36.73 kg/m?    ? ?Wt Readings from Last 3 Encounters:  ?04/30/21 214 lb (97.1 kg)  ?03/29/21 212 lb (96.2 kg)  ?07/18/19 194 lb 8 oz (88.2 kg)  ?  ? ?GEN: Moderately obese, well nourished, well developed in no acute distress ?HEENT: Normal ?NECK: No JVD; No carotid bruits ?LYMPHATICS: No lymphadenopathy ?CARDIAC: Irregular rhythm, systolic murmur is faint 1/6 and is only heard intermittently, following longer RR intervals, no diastolic murmurs, rubs, gallops ?RESPIRATORY:  Clear to auscultation without rales, wheezing or rhonchi  ?ABDOMEN: Soft, non-tender, non-distended ?MUSCULOSKELETAL: 2+ symmetrical pedal edema; No deformity  ?SKIN: Warm and dry ?NEUROLOGIC:  Alert and oriented x 3 ?PSYCHIATRIC:  Normal affect  ? ?ASSESSMENT:   ? ?1. HOCM (hypertrophic obstructive cardiomyopathy) ([place])   ?2. Persistent atrial fibrillation ([place])   ?3. Chronic diastolic heart failure ([place])   ?4. History of syncope   ?5. Essential hypertension   ? ?PLAN:   ? ?In order of problems listed above: ? ?HOCM: Currently  she appears to be remarkably well compensated on this higher dose of beta-blocker.  NYHA functional class I.  Murmur is much less prominent.  Discussed the mechanism of LV obstruction with HOCM, more likely to

## 2021-05-20 ENCOUNTER — Ambulatory Visit: Payer: Medicare Other | Admitting: Nurse Practitioner

## 2021-05-25 ENCOUNTER — Other Ambulatory Visit: Payer: Self-pay | Admitting: Cardiovascular Disease

## 2021-05-25 ENCOUNTER — Telehealth: Payer: Self-pay | Admitting: Cardiovascular Disease

## 2021-05-25 NOTE — Telephone Encounter (Signed)
?*  STAT* If patient is at the pharmacy, call can be transferred to refill team. ? ? ?1. Which medications need to be refilled? (please list name of each medication and dose if known)  ? metoprolol succinate (TOPROL-XL) 100 MG 24 hr tablet  ? ?2. Which pharmacy/location (including street and city if local pharmacy) is medication to be sent to? [id number] - [place], [place] [phone] ? ?3. Do they need a 30 day or 90 day supply?  ?90 day ? ?

## 2021-05-28 NOTE — Telephone Encounter (Signed)
Refill sent to pharmacy already on 05/25/21. ?

## 2021-07-28 ENCOUNTER — Encounter: Payer: Self-pay | Admitting: Cardiology

## 2021-07-28 ENCOUNTER — Ambulatory Visit: Payer: Medicare Other | Admitting: Cardiology

## 2021-07-28 VITALS — BP 150/99 | HR 78 | Temp 98.4°F | Resp 17 | Ht 64.0 in | Wt 220.0 lb

## 2021-07-28 DIAGNOSIS — R400 Somnolence: Secondary | ICD-10-CM

## 2021-07-28 DIAGNOSIS — I10 Essential (primary) hypertension: Secondary | ICD-10-CM

## 2021-07-28 DIAGNOSIS — I421 Obstructive hypertrophic cardiomyopathy: Secondary | ICD-10-CM

## 2021-07-28 DIAGNOSIS — I5032 Chronic diastolic (congestive) heart failure: Secondary | ICD-10-CM

## 2021-07-28 DIAGNOSIS — I480 Paroxysmal atrial fibrillation: Secondary | ICD-10-CM | POA: Insufficient documentation

## 2021-07-28 DIAGNOSIS — I4819 Other persistent atrial fibrillation: Secondary | ICD-10-CM | POA: Insufficient documentation

## 2021-07-28 DIAGNOSIS — N1831 Chronic kidney disease, stage 3a: Secondary | ICD-10-CM

## 2021-07-28 MED ORDER — DILTIAZEM HCL ER COATED BEADS 180 MG PO CP24: 180.0000 mg | ORAL_CAPSULE | Freq: Every day | ORAL | 2 refills | Status: DC

## 2021-07-28 NOTE — Progress Notes (Signed)
Primary Physician/Referring:  Patient, No Pcp Per  Patient ID: Christine Jacobson, female    DOB: November 25, 1955, 66 y.o.   MRN: [id number]  Chief Complaint  Patient presents with   New Patient (Initial Visit)   Hypertension   Atrial Fibrillation   HPI:    Christine Jacobson  is a 66 y.o. Patient with HOCM intracavitary pressure gradient of 64 mmHg increasing millimeter mercury with Valsalva maneuver, chronic diastolic heart failure grade 2 diastolic dysfunction, paroxysmal atrial fibrillation, primary hypertension, hypercholesterolemia, moderate obesity is being previously followed by Dr. Sallyanne Kuster now wants to establish with me.  First episode of atrial fibrillation on 04/16/2020 and has been on Eliquis without bleeding diathesis, last EKG on 03/29/2021 revealing persistent atrial fibrillation.  Patient is markedly sedentary.  Except for chronic dyspnea on exertion and back pain and joint pain, she has no specific complaints today.  She has occasionally noticed elevated heart rate and palpitations for which she takes extra metoprolol.  No dizziness or syncope.  Past Medical History:  Diagnosis Date   Chest pain 11/22/2010   2D STRESS ECHO - EF 60%, peak stress EF 80%, normal, no evidence for stress-induced ischemia   HOCM (hypertrophic obstructive cardiomyopathy) ([place]) 06/21/2011   2D ECHO - EF >55%, normal   HTN (hypertension)    Hyperprolactinemia (HCC)    dx in her 18, took meds, self d/c a while back   Liver function test abnormality    normal when repeated   Mild hyperlipidemia    Obesity    Palpitations    negative stress echo in November 2012 with normal LV function; mild LVH, proximal septal thickening with narrow LVOT and mild gradient; mild MR and TR; Cardionet showed PACs in November 2012   Pyoderma gangrenosa    Shortness of breath 07/11/2011   MET TEST   Past Surgical History:  Procedure Laterality Date   SKIN GRAFT  2006   porcine, R leg   Family History  Problem Relation Age of  Onset   Heart disease Mother        endocarditis   Ovarian cancer Mother    Emphysema Father    Coronary artery disease Other        F in his 42   Diabetes Other        GP   Cancer Neg Hx     Social History   Tobacco Use   Smoking status: Never   Smokeless tobacco: Never  Substance Use Topics   Alcohol use: Yes    Alcohol/week: 7.0 - 14.0 standard drinks of alcohol    Types: 7 - 14 Standard drinks or equivalent per week    Comment: wine everday   Marital Status: Single  ROS  Review of Systems  Constitutional: Positive for malaise/fatigue.  Cardiovascular:  Positive for dyspnea on exertion and leg swelling. Negative for chest pain.  Musculoskeletal:  Positive for arthritis and back pain.   Objective  Blood pressure (!) 150/99, pulse 78, temperature 98.4 F (36.9 C), temperature source Temporal, resp. rate 17, height 5' 4"  (1.626 m), weight 220 lb (99.8 kg), SpO2 98 %. Body mass index is 37.76 kg/m.     07/28/2021   11:09 AM 07/28/2021   10:37 AM 04/30/2021   10:12 PM  Vitals with BMI  Height  5' 4"    Weight  220 lbs   BMI  37.16   Systolic 967 893 810  Diastolic 99 175 90  Pulse  78  Physical Exam Constitutional:      Appearance: She is obese.  Neck:     Vascular: No carotid bruit or JVD.  Cardiovascular:     Rate and Rhythm: Normal rate and regular rhythm.     Pulses:          Femoral pulses are 0 on the right side and 0 on the left side.      Popliteal pulses are 0 on the right side and 0 on the left side.       Dorsalis pedis pulses are 0 on the right side and 0 on the left side.       Posterior tibial pulses are 0 on the right side and 0 on the left side.     Heart sounds: Heart sounds are distant. Murmur heard.     Midsystolic murmur is present with a grade of 2/6 at the upper right sternal border.     No gallop.     Comments: Could not feel her peripheral pulses due to nonpitting edema in bilateral lower extremity and obesity. Pulmonary:      Effort: Pulmonary effort is normal.     Breath sounds: Normal breath sounds.  Abdominal:     General: Bowel sounds are normal.     Palpations: Abdomen is soft.     Comments: Obese. Pannus present  Musculoskeletal:     Right lower leg: Edema (Nonpitting edema with chronic venous stasis changes and thick skin.) present.     Left lower leg: Edema (Nonpitting edema with chronic venous stasis changes and thick skin.) present.     Medications and allergies  No Known Allergies   Medication list after today's encounter   Current Outpatient Medications:    apixaban (ELIQUIS) 5 MG TABS tablet, Take 1 tablet (5 mg total) by mouth 2 (two) times daily., Disp: 60 tablet, Rfl: 5   diltiazem (CARDIZEM CD) 180 MG 24 hr capsule, Take 1 capsule (180 mg total) by mouth daily., Disp: 30 capsule, Rfl: 2   Ibuprofen 200 MG CAPS, Take 200-800 mg by mouth every 6 (six) hours as needed (headache or pain). , Disp: , Rfl:    metoprolol succinate (TOPROL-XL) 100 MG 24 hr tablet, TAKE ONE TABLET BY MOUTH TWICE A DAY WITH OR IMMEDIATELY FOLLOWING A MEAL, Disp: 180 tablet, Rfl: 3   nitroGLYCERIN (NITROSTAT) 0.4 MG SL tablet, 1 sublingual every 5 minutes 3 times if chest pain, Disp: 20 tablet, Rfl: 1   potassium chloride SA (KLOR-CON M) 20 MEQ tablet, Take 1 tablet (20 mEq total) by mouth daily., Disp: 30 tablet, Rfl: 1   furosemide (LASIX) 80 MG tablet, Take 0.5 tablets (40 mg total) by mouth daily., Disp: 30 tablet, Rfl: 1  Laboratory examination:   Lab Results  Component Value Date   NA 143 03/29/2021   K 4.4 03/29/2021   CO2 22 03/29/2021   GLUCOSE 105 (H) 03/29/2021   BUN 23 03/29/2021   CREATININE 1.13 (H) 03/29/2021   CALCIUM 9.9 03/29/2021   EGFR 53 (L) 03/29/2021   GFRNONAA 53 (L) 07/18/2019     CrCl cannot be calculated (Patient's most recent lab result is older than the maximum 21 days allowed.).     Latest Ref Rng & Units 03/29/2021    9:34 AM 07/18/2019    1:02 PM 12/15/2016    9:27 AM  CMP   Glucose 70 - 99 mg/dL 105  110  121   BUN 8 - 27 mg/dL 23  25  23   Creatinine 0.57 - 1.00 mg/dL 1.13  1.08  1.02   Sodium 134 - 144 mmol/L 143  143  142   Potassium 3.5 - 5.2 mmol/L 4.4  3.4  4.2   Chloride 96 - 106 mmol/L 104  106  103   CO2 20 - 29 mmol/L 22  25  31    Calcium 8.7 - 10.3 mg/dL 9.9  9.5  10.6       Latest Ref Rng & Units 03/29/2021    9:33 AM 07/18/2019    1:02 PM 12/15/2016    9:26 AM  CBC  WBC 3.4 - 10.8 x10E3/uL 7.5  8.7  7.0   Hemoglobin 11.1 - 15.9 g/dL 13.2  12.5  12.9   Hematocrit 34.0 - 46.6 % 38.3  38.5  38.9   Platelets 150 - 450 x10E3/uL 199  227  265    Lipid Panel No results for input(s): "CHOL", "TRIG", "[place]", "VLDL", "HDL", "CHOLHDL", "LDLDIRECT" in the last 8760 hours.  HEMOGLOBIN A1C No results found for: "HGBA1C", "MPG" TSH No results for input(s): "TSH" in the last 8760 hours.  Radiology:   Chest x-ray 07/18/2019: There is mild left midlung atelectasis. Lungs elsewhere are clear. Heart is upper normal in size with pulmonary vascularity normal. No adenopathy. No pneumothorax. There is degenerative change in the thoracic spine.  Cardiac Studies:   Echocardiogram 07/29/2017: - Left ventricle: The cavity size was normal. Wall thickness was   increased in a pattern of severe LVH. Systolic function was   vigorous. The estimated ejection fraction was in the range of 65%   to 70%. Wall motion was normal; there were no regional wall   motion abnormalities. Features are consistent with a pseudonormal  left ventricular filling pattern, with concomitant abnormal   relaxation and increased filling pressure (grade 2 diastolic dysfunction). - Aortic valve: Mildly calcified annulus. Mildly thickened  leaflets. - Mitral valve: There was mild regurgitation. - Left atrium: The atrium was severely dilated. - Right atrium: The atrium was mildly dilated. - Pulmonary arteries: Systolic pressure was mildly increased. PA  peak pressure: 38 mm Hg (S).  EKG:    EKG 07/28/2021: Atrial fibrillation with controlled ventricular response at rate of 79 bpm, LVH with repolarization abnormality.  Cannot exclude lateral ischemia. No significant change from 03/29/2021, lateral ST changes are slightly more pronounced.  Abnormal EKG.    Assessment     ICD-10-CM   1. HOCM (hypertrophic obstructive cardiomyopathy) (HCC)  I42.1 diltiazem (CARDIZEM CD) 180 MG 24 hr capsule    PCV ECHOCARDIOGRAM COMPLETE    PCV MYOCARDIAL PERFUSION WITH LEXISCAN    2. Persistent atrial fibrillation (HCC)  I48.19 EKG 12-Lead    diltiazem (CARDIZEM CD) 180 MG 24 hr capsule    PCV ECHOCARDIOGRAM COMPLETE    PCV MYOCARDIAL PERFUSION WITH LEXISCAN    3. Chronic diastolic heart failure (HCC)  I50.32 PCV ECHOCARDIOGRAM COMPLETE    PCV MYOCARDIAL PERFUSION WITH LEXISCAN    Ambulatory referral to Sleep Studies    furosemide (LASIX) 80 MG tablet    4. Primary hypertension  I10 Hgb A1c w/o eAG    Lipid Panel With LDL/HDL Ratio    TSH    Brain natriuretic peptide    5. Stage 3a chronic kidney disease (HCC)  N18.31     6. Daytime somnolence  R40.0 Ambulatory referral to Sleep Studies    7. Class 2 severe obesity due to excess calories with serious comorbidity and body mass index (BMI) of  37.0 to 37.9 in adult [facility])  E66.01 Ambulatory referral to Sleep Studies   Z68.37        Orders Placed This Encounter  Procedures   Hgb A1c w/o eAG   Lipid Panel With LDL/HDL Ratio   TSH   Brain natriuretic peptide   Ambulatory referral to Sleep Studies    Referral Priority:   Routine    Referral Type:   Consultation    Referral Reason:   Specialty Services Required    Referred to Provider:   Marius Ditch, MD    Number of Visits Requested:   1   PCV MYOCARDIAL PERFUSION WITH LEXISCAN    Standing Status:   Future    Standing Expiration Date:   09/28/2021   EKG 12-Lead   PCV ECHOCARDIOGRAM COMPLETE    Standing Status:   Future    Standing Expiration Date:   07/29/2022    Meds  ordered this encounter  Medications   diltiazem (CARDIZEM CD) 180 MG 24 hr capsule    Sig: Take 1 capsule (180 mg total) by mouth daily.    Dispense:  30 capsule    Refill:  2    Medications Discontinued During This Encounter  Medication Reason   furosemide (LASIX) 80 MG tablet      Recommendations:   Christine Jacobson is a 66 y.o.  Patient with HOCM intracavitary pressure gradient of 64 mmHg increasing millimeter mercury with Valsalva maneuver, chronic diastolic heart failure grade 2 diastolic dysfunction, paroxysmal atrial fibrillation, primary hypertension, hypercholesterolemia, moderate obesity is being previously followed by Dr. Sallyanne Kuster now wants to establish with me.  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  Patient is single and does not have any children or close relatives.  First episode of atrial fibrillation on 04/16/2020 and has been on Eliquis without bleeding diathesis, last EKG on 03/29/2021 revealing persistent atrial fibrillation.  Patient has markedly sent lifestyle, he is dyspneic even walking across the hallway, has very poor social habits, lives in a group home like situation with community kitchen and essentially does not cook.  Extensive review, she is in persistent atrial fibrillation, I would like to get baseline repeat echocardiogram and also a nuclear stress test along with routine labs including TSH and A1c.  I would like to see her back in 6 weeks.  I will add diltiazem CD 180 mg daily, to reduce her heart rate and improve diastolic compliance and reduce LVOT obstruction.  She is also taking massive amounts of furosemide, has stage III chronic kidney disease, advised her that the diastolic dysfunction worsens with diuresis especially in HOCM.  We will reduce the dose to 40 mg daily.  She has daytime somnolence, moderate obesity with significant arrhythmias as a risk factor for sleep apnea as well, she will need sleep study, will  refer her to Dr. Nehemiah Settle.  She drinks 1 bottle of wine a day, has quit drinking wine for the last 4 days.  Suspect her calorie intake is about 2000 cal a day or more.  I have discussed regarding weight loss.  Encouraged her to stay abstinent from alcohol.  I also highly recommended that she find her own place as she is meaningfully employed.  I would like to see her back in 6 weeks for follow-up.  She needs a PCP, will try to find a physician's office close to her home place.  This was a >60-minute office visit encounter and complex medical history.  Adrian Prows, MD, [facility] 07/28/2021, 12:37 PM Office: [phone]

## 2021-07-30 ENCOUNTER — Telehealth: Payer: Self-pay

## 2021-07-30 NOTE — Telephone Encounter (Signed)
VM 1110 : Patient left a message on VM.  Message is as follows:  " well good morning my name is Christine Jacobson my date of birth is January 18, 1955 I would appreciate a call back at [phone] the wonderful Dr Jacinto Halim has prescribed some new a pill for me I picked it up yesterday but I don't know if I should take this one or if or take it with the Toprol XR.  I'm not sure exactly what he wants me to do please can you call me my cellphone number again is [phone] my name is Christine Jacobson date of birth Aug 07, 1955 thank you."  I called patient, she just wants to know if she needs to take BOTH Diltiazem and Toprol together or STOP the TOPROL.   After speaking with Dr. Jacinto Halim, he said that she should be taking them both. Patient aware.

## 2021-08-09 ENCOUNTER — Other Ambulatory Visit: Payer: Medicare Other

## 2021-08-23 ENCOUNTER — Ambulatory Visit: Payer: Medicare Other

## 2021-08-23 DIAGNOSIS — I421 Obstructive hypertrophic cardiomyopathy: Secondary | ICD-10-CM

## 2021-08-23 DIAGNOSIS — I4819 Other persistent atrial fibrillation: Secondary | ICD-10-CM

## 2021-08-23 DIAGNOSIS — I5032 Chronic diastolic (congestive) heart failure: Secondary | ICD-10-CM

## 2021-08-25 ENCOUNTER — Other Ambulatory Visit: Payer: Medicare Other

## 2021-08-25 NOTE — Progress Notes (Signed)
Discuss on his office visit soon

## 2021-09-02 ENCOUNTER — Ambulatory Visit: Payer: Medicare Other

## 2021-09-02 DIAGNOSIS — I5032 Chronic diastolic (congestive) heart failure: Secondary | ICD-10-CM

## 2021-09-02 DIAGNOSIS — I4819 Other persistent atrial fibrillation: Secondary | ICD-10-CM

## 2021-09-02 DIAGNOSIS — I421 Obstructive hypertrophic cardiomyopathy: Secondary | ICD-10-CM

## 2021-09-09 ENCOUNTER — Ambulatory Visit: Payer: Medicare Other | Admitting: Cardiology

## 2021-09-09 ENCOUNTER — Encounter: Payer: Self-pay | Admitting: Cardiology

## 2021-09-09 VITALS — BP 142/78 | HR 80 | Temp 97.8°F | Resp 16 | Ht 64.0 in | Wt 222.0 lb

## 2021-09-09 DIAGNOSIS — L03116 Cellulitis of left lower limb: Secondary | ICD-10-CM

## 2021-09-09 DIAGNOSIS — I5031 Acute diastolic (congestive) heart failure: Secondary | ICD-10-CM

## 2021-09-09 DIAGNOSIS — I4819 Other persistent atrial fibrillation: Secondary | ICD-10-CM

## 2021-09-09 DIAGNOSIS — I421 Obstructive hypertrophic cardiomyopathy: Secondary | ICD-10-CM

## 2021-09-09 DIAGNOSIS — I10 Essential (primary) hypertension: Secondary | ICD-10-CM

## 2021-09-09 MED ORDER — DILTIAZEM HCL ER COATED BEADS 180 MG PO CP24: 180.0000 mg | ORAL_CAPSULE | Freq: Two times a day (BID) | ORAL | 2 refills | Status: DC

## 2021-09-09 MED ORDER — EMPAGLIFLOZIN 10 MG PO TABS: 10.0000 mg | ORAL_TABLET | Freq: Every day | ORAL | 1 refills | Status: DC

## 2021-09-09 MED ORDER — TORSEMIDE 20 MG PO TABS: 40.0000 mg | ORAL_TABLET | Freq: Every day | ORAL | 2 refills | Status: DC

## 2021-09-09 MED ORDER — AMOXICILLIN-POT CLAVULANATE 500-125 MG PO TABS: 1.0000 | ORAL_TABLET | Freq: Three times a day (TID) | ORAL | 0 refills | Status: DC

## 2021-09-09 NOTE — Progress Notes (Signed)
Primary Physician/Referring:  No primary care provider on file.  Patient ID: Christine Jacobson, female    DOB: 1955-04-14, 66 y.o.   MRN: [id number]  Chief Complaint  Patient presents with   Hypertension   Atrial Fibrillation   Follow-up    6 weeks   HPI:    Christine Jacobson  is a 66 y.o. 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  Patient is single and does not have any children or close relatives.  Patient has markedly sent lifestyle, he is dyspneic even walking across the hallway, has very poor social habits, lives in a group home like situation with community kitchen and essentially does not cook.  Patient established with me 68 weeks ago.  She now presents for follow-up.  I had added diltiazem CD1 80 mg daily and reduce the dose of diuretics to improve diastolic function and to induce bradycardia.  In view of HOCM.  Patient has restarted her furosemide at 80 mg twice daily stating that her leg swelling is worse.    Past Medical History:  Diagnosis Date   Chest pain 11/22/2010   2D STRESS ECHO - EF 60%, peak stress EF 80%, normal, no evidence for stress-induced ischemia   HOCM (hypertrophic obstructive cardiomyopathy) ([place]) 06/21/2011   2D ECHO - EF >55%, normal   HTN (hypertension)    Hyperprolactinemia (HCC)    dx in her 18, took meds, self d/c a while back   Liver function test abnormality    normal when repeated   Mild hyperlipidemia    Obesity    Palpitations    negative stress echo in November 2012 with normal LV function; mild LVH, proximal septal thickening with narrow LVOT and mild gradient; mild MR and TR; Cardionet showed PACs in November 2012   Pyoderma gangrenosa     Shortness of breath 07/11/2011   MET TEST   Past Surgical History:  Procedure Laterality Date   SKIN GRAFT  2006   porcine, R leg   Family History  Problem Relation Age of Onset   Heart disease Mother        endocarditis   Ovarian cancer Mother    Emphysema Father    Coronary artery disease Other        F in his 27   Diabetes Other        GP   Cancer Neg Hx     Social History   Tobacco Use   Smoking status: Never   Smokeless tobacco: Never  Substance Use Topics   Alcohol use: Yes    Alcohol/week: 1.0 standard drink of alcohol    Types: 1 Glasses of wine per week    Comment: wine everday   Marital Status: Single  ROS  Review of Systems  Constitutional: Positive for malaise/fatigue.  Cardiovascular:  Positive for dyspnea on exertion and leg swelling. Negative for chest pain.  Musculoskeletal:  Positive for arthritis and back pain.   Objective  Blood pressure (!) 142/78, pulse 80, temperature 97.8 F (36.6 C), temperature source Temporal, resp. rate 16, height 5' 4"  (1.626 m), weight 222 lb (100.7 kg), SpO2 94 %. Body mass index is 38.11 kg/m.     09/09/2021    2:32 PM 07/28/2021   11:09 AM 07/28/2021  10:37 AM  Vitals with BMI  Height 5' 4"   5' 4"   Weight 222 lbs  220 lbs  BMI 99.24  26.83  Systolic 419 622 297  Diastolic 78 99 989  Pulse 80  78     Physical Exam Constitutional:      Appearance: She is obese.  Neck:     Vascular: No carotid bruit or JVD.  Cardiovascular:     Rate and Rhythm: Normal rate and regular rhythm.     Pulses:          Femoral pulses are 0 on the right side and 0 on the left side.      Popliteal pulses are 0 on the right side and 0 on the left side.       Dorsalis pedis pulses are 0 on the right side and 0 on the left side.       Posterior tibial pulses are 0 on the right side and 0 on the left side.     Heart sounds: Heart sounds are distant. Murmur heard.     Midsystolic murmur is present with a grade of 2/6 at the upper right  sternal border.     No gallop.     Comments: Could not feel her peripheral pulses due to nonpitting edema in bilateral lower extremity and obesity. Pulmonary:     Effort: Pulmonary effort is normal.     Breath sounds: Normal breath sounds.  Abdominal:     General: Bowel sounds are normal.     Palpations: Abdomen is soft.     Comments: Obese. Pannus present  Musculoskeletal:     Right lower leg: Edema (Nonpitting edema with chronic venous stasis changes and thick skin.) present.     Left lower leg: Edema (Nonpitting edema with chronic venous stasis changes and thick skin.) present.  Skin:    Findings: Lesion (cellulitis right shin) present.     Medications and allergies  No Known Allergies   Medication list after today's encounter   Current Outpatient Medications:    amoxicillin-clavulanate (AUGMENTIN) 500-125 MG tablet, Take 1 tablet (500 mg total) by mouth 3 (three) times daily., Disp: 30 tablet, Rfl: 0   apixaban (ELIQUIS) 5 MG TABS tablet, Take 1 tablet (5 mg total) by mouth 2 (two) times daily., Disp: 60 tablet, Rfl: 5   empagliflozin (JARDIANCE) 10 MG TABS tablet, Take 1 tablet (10 mg total) by mouth daily before breakfast., Disp: 30 tablet, Rfl: 1   Ibuprofen 200 MG CAPS, Take 200-800 mg by mouth every 6 (six) hours as needed (headache or pain). , Disp: , Rfl:    metoprolol succinate (TOPROL-XL) 100 MG 24 hr tablet, TAKE ONE TABLET BY MOUTH TWICE A DAY WITH OR IMMEDIATELY FOLLOWING A MEAL (Patient taking differently: Take 100 mg by mouth in the morning, at noon, and at bedtime.), Disp: 180 tablet, Rfl: 3   nitroGLYCERIN (NITROSTAT) 0.4 MG SL tablet, 1 sublingual every 5 minutes 3 times if chest pain, Disp: 20 tablet, Rfl: 1   potassium chloride SA (KLOR-CON M) 20 MEQ tablet, Take 1 tablet (20 mEq total) by mouth daily., Disp: 30 tablet, Rfl: 1   torsemide (DEMADEX) 20 MG tablet, Take 2 tablets (40 mg total) by mouth daily., Disp: 60 tablet, Rfl: 2   diltiazem (CARDIZEM CD) 180  MG 24 hr capsule, Take 1 capsule (180 mg total) by mouth in the morning and at bedtime., Disp: 60 capsule, Rfl: 2  Laboratory examination:   Lab Results  Component Value Date   NA 143 03/29/2021   K 4.4 03/29/2021   CO2 22 03/29/2021   GLUCOSE 105 (H) 03/29/2021   BUN 23 03/29/2021   CREATININE 1.13 (H) 03/29/2021   CALCIUM 9.9 03/29/2021   EGFR 53 (L) 03/29/2021   GFRNONAA 53 (L) 07/18/2019     CrCl cannot be calculated (Patient's most recent lab result is older than the maximum 21 days allowed.).     Latest Ref Rng & Units 03/29/2021    9:34 AM 07/18/2019    1:02 PM 12/15/2016    9:27 AM  CMP  Glucose 70 - 99 mg/dL 105  110  121   BUN 8 - 27 mg/dL 23  25  23    Creatinine 0.57 - 1.00 mg/dL 1.13  1.08  1.02   Sodium 134 - 144 mmol/L 143  143  142   Potassium 3.5 - 5.2 mmol/L 4.4  3.4  4.2   Chloride 96 - 106 mmol/L 104  106  103   CO2 20 - 29 mmol/L 22  25  31    Calcium 8.7 - 10.3 mg/dL 9.9  9.5  10.6       Latest Ref Rng & Units 03/29/2021    9:33 AM 07/18/2019    1:02 PM 12/15/2016    9:26 AM  CBC  WBC 3.4 - 10.8 x10E3/uL 7.5  8.7  7.0   Hemoglobin 11.1 - 15.9 g/dL 13.2  12.5  12.9   Hematocrit 34.0 - 46.6 % 38.3  38.5  38.9   Platelets 150 - 450 x10E3/uL 199  227  265    Lipid Panel No results for input(s): "CHOL", "TRIG", "[place]", "VLDL", "HDL", "CHOLHDL", "LDLDIRECT" in the last 8760 hours.  HEMOGLOBIN A1C No results found for: "HGBA1C", "MPG" TSH No results for input(s): "TSH" in the last 8760 hours.  Radiology:   Chest x-ray 07/18/2019: There is mild left midlung atelectasis. Lungs elsewhere are clear. Heart is upper normal in size with pulmonary vascularity normal. No adenopathy. No pneumothorax. There is degenerative change in the thoracic spine.  Cardiac Studies:   PCV ECHOCARDIOGRAM COMPLETE 09/02/2021  Narrative Echocardiogram 08/17/2021: 1. Normal LV systolic function with visual EF 60-65%. Left ventricle cavity is normal in size. Hypertrophic  cardiomyopathy. Severe concentric hypertrophy of the left ventricle. Normal global wall motion. Ungraded restrictive diastolic dysfunction, elevated LAP. 2. Left atrial cavity is severely dilated at 5.4 cm. 3. Right atrial cavity is moderately dilated. 4. Right ventricle cavity is mild to moderately dilated. Normal right ventricular function. 5. Mild (Grade I) mitral regurgitation. Systolic anterior motion of MV noted. Moderate mitral valve posterior leaflet calcification. E-wave dominant mitral inflow. 6. Structurally normal tricuspid valve. Moderate tricuspid regurgitation. Moderate pulmonary hypertension. RVSP measures 45 mmHg. 7. IVC is dilated with respiratory variation. 9. Recommend TEE.  Compared to the study done on 07/29/2017 report, RV dilatation is new.  Otherwise no significant change.     PCV MYOCARDIAL PERFUSION WITH LEXISCAN 08/23/2021  Narrative Lexiscan (with Mod Bruce protocol) Nuclear stress test 08/23/2021 Myocardial perfusion is normal. Overall LV systolic function is normal without regional wall motion abnormalities. Stress LV EF: 63%. Abnormal ECG stress, positive for ischemic changes in inferior leads. TID ratio 1.28, which is abnormal therefore cannot r/o balanced ischemia given positive EKG and TID. No previous exam available for comparison. High risk study.    EKG:   EKG 09/09/2021: Atrial fibrillation with controlled ventricular response at rate of 76 bpm, incomplete right bundle branch block, nonspecific ST-T  abnormality.  LVH.  No significant change from 07/28/2021.   Assessment     ICD-10-CM   1. Persistent atrial fibrillation (HCC)  I48.19 EKG 12-Lead    diltiazem (CARDIZEM CD) 180 MG 24 hr capsule    2. Primary hypertension  I10 CMP14+EGFR    3. Cellulitis of left leg  L03.116 amoxicillin-clavulanate (AUGMENTIN) 500-125 MG tablet    4. HOCM (hypertrophic obstructive cardiomyopathy) (HCC)  I42.1 diltiazem (CARDIZEM CD) 180 MG 24 hr capsule    5. Acute  diastolic heart failure (HCC)  I50.31 torsemide (DEMADEX) 20 MG tablet    empagliflozin (JARDIANCE) 10 MG TABS tablet       Orders Placed This Encounter  Procedures   CMP14+EGFR   EKG 12-Lead    Meds ordered this encounter  Medications   amoxicillin-clavulanate (AUGMENTIN) 500-125 MG tablet    Sig: Take 1 tablet (500 mg total) by mouth 3 (three) times daily.    Dispense:  30 tablet    Refill:  0   torsemide (DEMADEX) 20 MG tablet    Sig: Take 2 tablets (40 mg total) by mouth daily.    Dispense:  60 tablet    Refill:  2   diltiazem (CARDIZEM CD) 180 MG 24 hr capsule    Sig: Take 1 capsule (180 mg total) by mouth in the morning and at bedtime.    Dispense:  60 capsule    Refill:  2   empagliflozin (JARDIANCE) 10 MG TABS tablet    Sig: Take 1 tablet (10 mg total) by mouth daily before breakfast.    Dispense:  30 tablet    Refill:  1    Medications Discontinued During This Encounter  Medication Reason   furosemide (LASIX) 80 MG tablet Change in therapy   diltiazem (CARDIZEM CD) 180 MG 24 hr capsule Reorder     Recommendations:   Christine Jacobson is a 66 y.o.  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  Patient is single and does not have any children or close relatives.  Patient has markedly sent lifestyle, he is dyspneic even walking across the hallway, has very poor social habits, lives in a group home like situation with community kitchen and essentially does not cook.  Patient established with me 68 weeks ago.  She now presents for follow-up.  I had added diltiazem CD1 80 mg daily and reduce the dose of diuretics to improve diastolic function and to induce bradycardia.  In  view of HOCM.  Patient has restarted her furosemide at 80 mg twice daily stating that her leg swelling is worse.  She has no developed cellulitis of her left shin.  I have prescribed her Augmentin 3 times daily, I will also start her on torsemide 40 mg daily and discontinue furosemide as she is not responding well to Lasix.  Reduction in salt and fluids again discussed extensively with the patient.  Will increase diltiazem CD from 180 mg daily to twice daily dosing to decrease her heart rate further.  We will start Jardiance 10 mg daily for diastolic heart failure.  Patient has canceled setting up a sleep study which I encouraged her to keep for sleep study.  She did not obtain any of the lab work that I requested on her last  office visit including BMP, TSH, lipids and A1c, I will also add CMP as she does have stage IIIa chronic kidney disease.  I have reviewed the results of the echocardiogram with the patient.  Patient is mostly unchanged from prior echocardiogram from 2019 except RV dilatation is new and suspect part of chronic cor pulmonale and probably chronic diastolic heart failure leading to secondary pulm hypertension.  Nuclear stress test also reviewed, had abnormal EKG response with ischemic changes in inferior leads and 3 times daily was abnormal at 1.28 and hence may indicate multivessel disease.  She may need cardiac catheterization at some point but at this time I would like to continue medical therapy.  In view of her underlying comorbidity.  Extensive discussion with the patient regarding lifestyle modification.  I would like to see her back in 2 weeks as she is at extreme high risk for admission to the hospital with heart failure.  She will also need to be scheduled for cardioversion which I will discuss on her next office visit.  She promises to get her blood work done tomorrow.  This was a 40-minute office visit encounter.   Adrian Prows, MD, [facility] 09/09/2021, 5:51 PM Office:  [phone]

## 2021-09-09 NOTE — Patient Instructions (Signed)
Blood work tomorrow.

## 2021-09-14 ENCOUNTER — Encounter: Payer: Self-pay | Admitting: Cardiology

## 2021-09-14 ENCOUNTER — Telehealth: Payer: Self-pay | Admitting: Cardiology

## 2021-09-14 NOTE — Telephone Encounter (Signed)
The above named patient needs to be excluded from work starting tomorrow for 1 week and will return to work on 09/21/2021.   I sent this to her employer

## 2021-09-14 NOTE — Telephone Encounter (Signed)
Patient says at last appointment, she was told she could get a letter excusing her from work for a week. She's asking if you can write it. Employer is [employer], fax # [phone].

## 2021-09-22 ENCOUNTER — Encounter: Payer: Self-pay | Admitting: Cardiology

## 2021-09-22 ENCOUNTER — Ambulatory Visit: Payer: Medicare Other | Admitting: Cardiology

## 2021-09-22 VITALS — BP 143/95 | HR 75 | Temp 98.1°F | Ht 64.0 in | Wt 214.0 lb

## 2021-09-22 DIAGNOSIS — I5031 Acute diastolic (congestive) heart failure: Secondary | ICD-10-CM

## 2021-09-22 DIAGNOSIS — I4819 Other persistent atrial fibrillation: Secondary | ICD-10-CM

## 2021-09-22 DIAGNOSIS — I421 Obstructive hypertrophic cardiomyopathy: Secondary | ICD-10-CM

## 2021-09-22 DIAGNOSIS — I10 Essential (primary) hypertension: Secondary | ICD-10-CM

## 2021-09-22 NOTE — Progress Notes (Signed)
Primary Physician/Referring:  No primary care provider on file.  Patient ID: Christine Jacobson, female    DOB: Dec 23, 1955, 66 y.o.   MRN: [id number]  Chief Complaint  Patient presents with   Congestive Heart Failure   Follow-up    2 weeks   HPI:    Christine Jacobson  is a 66 y.o. 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  Patient is single and does not have any children or close relatives.  I had seen her 2 weeks ago, she had developed cellulitis of her left leg and had started on antibiotics.  I had also taken her off of work for a week to keep herself at bedrest to decrease her chronic lymphedema and also to improve her cellulitis.  She has lost about 8 pounds in weight since starting Jardiance.  States that her leg edema is improved.  She still feels miserable overall and does not feel well.  Dyspnea is chronic.  Past Medical History:  Diagnosis Date   Chest pain 11/22/2010   2D STRESS ECHO - EF 60%, peak stress EF 80%, normal, no evidence for stress-induced ischemia   HOCM (hypertrophic obstructive cardiomyopathy) ([place]) 06/21/2011   2D ECHO - EF >55%, normal   HTN (hypertension)    Hyperprolactinemia (HCC)    dx in her 18, took meds, self d/c a while back   Liver function test abnormality    normal when repeated   Mild hyperlipidemia    Obesity    Palpitations    negative stress echo in November 2012 with normal LV function; mild LVH, proximal septal thickening with narrow LVOT and mild gradient; mild MR and TR; Cardionet showed PACs in November 2012   Pyoderma gangrenosa    Shortness of breath 07/11/2011   MET TEST   Past Surgical History:  Procedure Laterality Date   SKIN  GRAFT  2006   porcine, R leg   Family History  Problem Relation Age of Onset   Heart disease Mother        endocarditis   Ovarian cancer Mother    Emphysema Father    Coronary artery disease Other        F in his 37   Diabetes Other        GP   Cancer Neg Hx     Social History   Tobacco Use   Smoking status: Never   Smokeless tobacco: Never  Substance Use Topics   Alcohol use: Yes    Alcohol/week: 1.0 standard drink of alcohol    Types: 1 Glasses of wine per week    Comment: wine everday   Marital Status: Single  ROS  Review of Systems  Constitutional: Positive for malaise/fatigue.  Cardiovascular:  Positive for dyspnea on exertion and leg swelling. Negative for chest pain.  Musculoskeletal:  Positive for arthritis and back pain.   Objective  Blood pressure (!) 143/95, pulse 75, temperature 98.1 F (36.7 C), temperature source Temporal, height _0  (1.626 m), weight 214 lb (97.1 kg), head circumference 16" (40.6 cm), SpO2 94 %. Body mass index is 36.73 kg/m.     09/22/2021   11:27 AM 09/09/2021    2:32 PM 07/28/2021   11:09 AM  Vitals with BMI  Height _1  5'  4"   Weight 214 lbs 222 lbs   BMI 14.97 02.63   Systolic 785 885 027  Diastolic 95 78 99  Pulse 75 80      Physical Exam Constitutional:      Appearance: She is obese.  Neck:     Vascular: No carotid bruit or JVD.  Cardiovascular:     Rate and Rhythm: Normal rate and regular rhythm.     Pulses:          Femoral pulses are 0 on the right side and 0 on the left side.      Popliteal pulses are 0 on the right side and 0 on the left side.       Dorsalis pedis pulses are 0 on the right side and 0 on the left side.       Posterior tibial pulses are 0 on the right side and 0 on the left side.     Heart sounds: Heart sounds are distant. Murmur heard.     Midsystolic murmur is present with a grade of 2/6 at the upper right sternal border.     No gallop.     Comments: Could not feel her peripheral pulses due  to nonpitting edema in bilateral lower extremity and obesity. Pulmonary:     Effort: Pulmonary effort is normal.     Breath sounds: Normal breath sounds.  Abdominal:     General: Bowel sounds are normal.     Palpations: Abdomen is soft.     Comments: Obese. Pannus present  Musculoskeletal:     Right lower leg: Edema (Nonpitting edema with chronic venous stasis changes and thick skin.) present.     Left lower leg: Edema (Nonpitting edema with chronic venous stasis changes and thick skin.) present.  Skin:    Findings: Lesion (cellulitis right shin) present.     Medications and allergies  No Known Allergies   Medication list after today's encounter   Current Outpatient Medications:    amoxicillin-clavulanate (AUGMENTIN) 500-125 MG tablet, Take 1 tablet (500 mg total) by mouth 3 (three) times daily., Disp: 30 tablet, Rfl: 0   apixaban (ELIQUIS) 5 MG TABS tablet, Take 1 tablet (5 mg total) by mouth 2 (two) times daily., Disp: 60 tablet, Rfl: 5   diltiazem (CARDIZEM CD) 180 MG 24 hr capsule, Take 1 capsule (180 mg total) by mouth in the morning and at bedtime., Disp: 60 capsule, Rfl: 2   empagliflozin (JARDIANCE) 10 MG TABS tablet, Take 1 tablet (10 mg total) by mouth daily before breakfast., Disp: 30 tablet, Rfl: 1   Ibuprofen 200 MG CAPS, Take 200-800 mg by mouth every 6 (six) hours as needed (headache or pain). , Disp: , Rfl:    metoprolol succinate (TOPROL-XL) 100 MG 24 hr tablet, TAKE ONE TABLET BY MOUTH TWICE A DAY WITH OR IMMEDIATELY FOLLOWING A MEAL (Patient taking differently: Take 100 mg by mouth in the morning, at noon, and at bedtime.), Disp: 180 tablet, Rfl: 3   potassium chloride SA (KLOR-CON M) 20 MEQ tablet, Take 1 tablet (20 mEq total) by mouth daily., Disp: 30 tablet, Rfl: 1   torsemide (DEMADEX) 20 MG tablet, Take 2 tablets (40 mg total) by mouth daily., Disp: 60 tablet, Rfl: 2  Laboratory examination:   Lab Results  Component Value Date   NA 143 03/29/2021   K 4.4  03/29/2021   CO2 22 03/29/2021   GLUCOSE 105 (H) 03/29/2021   BUN 23 03/29/2021   CREATININE 1.13 (H)  03/29/2021   CALCIUM 9.9 03/29/2021   EGFR 53 (L) 03/29/2021   GFRNONAA 53 (L) 07/18/2019   CrCl cannot be calculated (Patient's most recent lab result is older than the maximum 21 days allowed.).     Latest Ref Rng & Units 03/29/2021    9:34 AM 07/18/2019    1:02 PM 12/15/2016    9:27 AM  CMP  Glucose 70 - 99 mg/dL 105  110  121   BUN 8 - 27 mg/dL _0 Creatinine 0.57 - 1.00 mg/dL 1.13  1.08  1.02   Sodium 134 - 144 mmol/L 143  143  142   Potassium 3.5 - 5.2 mmol/L 4.4  3.4  4.2   Chloride 96 - 106 mmol/L 104  106  103   CO2 20 - 29 mmol/L _1 Calcium 8.7 - 10.3 mg/dL 9.9  9.5  10.6       Latest Ref Rng & Units 03/29/2021    9:33 AM 07/18/2019    1:02 PM 12/15/2016    9:26 AM  CBC  WBC 3.4 - 10.8 x10E3/uL 7.5  8.7  7.0   Hemoglobin 11.1 - 15.9 g/dL 13.2  12.5  12.9   Hematocrit 34.0 - 46.6 % 38.3  38.5  38.9   Platelets 150 - 450 x10E3/uL 199  227  265    Lipid Panel No results for input(s): "CHOL", "TRIG", "[place]", "VLDL", "HDL", "CHOLHDL", "LDLDIRECT" in the last 8760 hours.  HEMOGLOBIN A1C No results found for: "HGBA1C", "MPG" TSH No results for input(s): "TSH" in the last 8760 hours.  Radiology:   Chest x-ray 07/18/2019: There is mild left midlung atelectasis. Lungs elsewhere are clear. Heart is upper normal in size with pulmonary vascularity normal. No adenopathy. No pneumothorax. There is degenerative change in the thoracic spine.  Cardiac Studies:   PCV ECHOCARDIOGRAM COMPLETE 09/02/2021  Narrative Echocardiogram 08/17/2021: 1. Normal LV systolic function with visual EF 60-65%. Left ventricle cavity is normal in size. Hypertrophic cardiomyopathy. Severe concentric hypertrophy of the left ventricle. Normal global wall motion. Ungraded restrictive diastolic dysfunction, elevated LAP. 2. Left atrial cavity is severely dilated at 5.4 cm. 3.  Right atrial cavity is moderately dilated. 4. Right ventricle cavity is mild to moderately dilated. Normal right ventricular function. 5. Mild (Grade I) mitral regurgitation. Systolic anterior motion of MV noted. Moderate mitral valve posterior leaflet calcification. E-wave dominant mitral inflow. 6. Structurally normal tricuspid valve. Moderate tricuspid regurgitation. Moderate pulmonary hypertension. RVSP measures 45 mmHg. 7. IVC is dilated with respiratory variation. 9. Recommend TEE.  Compared to the study done on 07/29/2017 report, RV dilatation is new.  Otherwise no significant change.  PCV MYOCARDIAL PERFUSION WITH LEXISCAN 08/23/2021  Myocardial perfusion is normal. Overall LV systolic function is normal without regional wall motion abnormalities. Stress LV EF: 63%. Abnormal ECG stress, positive for ischemic changes in inferior leads. TID ratio 1.28, which is abnormal therefore cannot r/o balanced ischemia given positive EKG and TID. No previous exam available for comparison. High risk study.    EKG:   EKG 09/09/2021: Atrial fibrillation with controlled ventricular response at rate of 76 bpm, incomplete right bundle branch block, nonspecific ST-T abnormality.  LVH.  No significant change from 07/28/2021.   Assessment     ICD-10-CM   1. Acute diastolic heart failure (HCC)  I50.31     2. Persistent atrial fibrillation (HCC)  I48.19     3. Primary hypertension  I10  4. HOCM (hypertrophic obstructive cardiomyopathy) (HCC)  I42.1        No orders of the defined types were placed in this encounter.   No orders of the defined types were placed in this encounter.   Medications Discontinued During This Encounter  Medication Reason   nitroGLYCERIN (NITROSTAT) 0.4 MG SL tablet      Recommendations:   Christine Jacobson is a 66 y.o.  Patient with HOCM intracavitary pressure gradient of 64 mmHg increasing millimeter mercury with Valsalva maneuver, chronic diastolic heart failure  grade 2 diastolic dysfunction, paroxysmal atrial fibrillation, primary hypertension, hypercholesterolemia, moderate obesity,   It was felt to be wild strain cardiomyopathy in the absence of family history.  Patient is single and does not have any children or close relatives.  She has had a nonischemic nuclear stress test in August 2023.  However, 3 times daily ratio was elevated.  Suspected to be due to subendocardial ischemia from HOCM.  I had seen her 2 weeks ago, she had developed cellulitis of her left leg and had started on antibiotics.  I had also taken her off of work for a week to keep herself at bedrest to decrease her chronic lymphedema and also to improve her cellulitis.  She has lost about 8 pounds in weight since starting Jardiance and also heart rate is slightly better with addition and increasing diltiazem CD to twice daily dosing along with beta-blockers to reduce the heart rate and intracavitary pressure gradient.  Extremely complex presentation, she is extremely depressed and anxious as well.  I offered her hospitalization for wound care and also heart failure management which she refused.  She promises to keep the appointment to get labs done which she has missed twice already.  I also offered her medications for anxiety and depression.  I suspect she probably has sleep apnea but he canceled the appointment and does not want to evaluate any further.  She request that I do not add any new medications, do not send her to see any other physicians and she is "tired".  For now I will increase the dose of torsemide to twice daily dosing from once a day to improve her leg edema and also I suspect she probably has venous insufficiency and has developed chronic lymphedema as well and may benefit from venous insufficiency study which she again does not want to undergo at present.  I would like to see her back in 4 weeks for follow-up.  I have tried my best to improve her condition, she now has  ulcerations on her shin with mild discharge although is not foul-smelling.  Serosanguineous discharge.  She is presently still on antibiotics that are prescribed for 2 weeks and she will continue this for now.  She promises to get her blood work done tomorrow ( had said so last time as well).  This was a 40-minute office visit encounter.  I spent a total of 39 minutes with the patient and discussing her major medical issues and hospitalization.   Adrian Prows, MD, [facility] 09/22/2021, 11:43 AM Office: [phone]

## 2021-09-23 ENCOUNTER — Other Ambulatory Visit: Payer: Self-pay

## 2021-09-23 DIAGNOSIS — L03116 Cellulitis of left lower limb: Secondary | ICD-10-CM

## 2021-09-23 MED ORDER — AMOXICILLIN-POT CLAVULANATE 500-125 MG PO TABS: 1.0000 | ORAL_TABLET | Freq: Three times a day (TID) | ORAL | 0 refills | Status: DC

## 2021-09-24 ENCOUNTER — Ambulatory Visit: Payer: Medicare Other | Admitting: Cardiology

## 2021-10-07 ENCOUNTER — Other Ambulatory Visit: Payer: Self-pay | Admitting: Cardiology

## 2021-10-07 ENCOUNTER — Telehealth: Payer: Self-pay

## 2021-10-07 DIAGNOSIS — L03116 Cellulitis of left lower limb: Secondary | ICD-10-CM

## 2021-10-07 NOTE — Telephone Encounter (Signed)
Patient called and stated that her leg is leaking on the back of her left leg and is very painful. She has complete the antibiotic this morning and needs to know if you want her to try something else. Patient rated pain level at an 8. Please advise.   Cell [phone]

## 2021-10-07 NOTE — Telephone Encounter (Signed)
She needs to be admitted to the hospital. I offered this to her last time. I am unable to manage this in the OP setting

## 2021-10-08 NOTE — Telephone Encounter (Signed)
I spoke with patient and she is very upset that she keeps getting "bad news" from you. She is refusing to go to hospital today, but she said she will sleep it off for tonight and if its still bad tomorrow she will call ambulance to come get her.

## 2021-10-09 ENCOUNTER — Emergency Department (HOSPITAL_COMMUNITY)
Admission: EM | Admit: 2021-10-09 | Discharge: 2021-10-09 | Disposition: A | Discharge status: Home / Self Care | Payer: Medicare Other | Attending: Emergency Medicine | Admitting: Emergency Medicine

## 2021-10-09 ENCOUNTER — Other Ambulatory Visit: Payer: Self-pay

## 2021-10-09 ENCOUNTER — Encounter (HOSPITAL_COMMUNITY): Payer: Self-pay

## 2021-10-09 ENCOUNTER — Emergency Department (HOSPITAL_BASED_OUTPATIENT_CLINIC_OR_DEPARTMENT_OTHER): Payer: Medicare Other

## 2021-10-09 DIAGNOSIS — L03116 Cellulitis of left lower limb: Secondary | ICD-10-CM | POA: Diagnosis not present

## 2021-10-09 DIAGNOSIS — Z79899 Other long term (current) drug therapy: Secondary | ICD-10-CM | POA: Insufficient documentation

## 2021-10-09 DIAGNOSIS — I110 Hypertensive heart disease with heart failure: Secondary | ICD-10-CM | POA: Insufficient documentation

## 2021-10-09 DIAGNOSIS — Z7901 Long term (current) use of anticoagulants: Secondary | ICD-10-CM | POA: Diagnosis not present

## 2021-10-09 DIAGNOSIS — I509 Heart failure, unspecified: Secondary | ICD-10-CM | POA: Insufficient documentation

## 2021-10-09 DIAGNOSIS — I4891 Unspecified atrial fibrillation: Secondary | ICD-10-CM | POA: Diagnosis not present

## 2021-10-09 DIAGNOSIS — M7989 Other specified soft tissue disorders: Secondary | ICD-10-CM

## 2021-10-09 DIAGNOSIS — M79605 Pain in left leg: Secondary | ICD-10-CM | POA: Diagnosis present

## 2021-10-09 DIAGNOSIS — R600 Localized edema: Secondary | ICD-10-CM

## 2021-10-09 LAB — BASIC METABOLIC PANEL
Anion gap: 11 (ref 5–15)
BUN: 46 mg/dL — ABNORMAL HIGH (ref 8–23)
CO2: 27 mmol/L (ref 22–32)
Calcium: 10.3 mg/dL (ref 8.9–10.3)
Chloride: 100 mmol/L (ref 98–111)
Creatinine, Ser: 1.51 mg/dL — ABNORMAL HIGH (ref 0.44–1.00)
GFR, Estimated: 37 mL/min — ABNORMAL LOW (ref >60)
Glucose, Bld: 137 mg/dL — ABNORMAL HIGH (ref 70–99)
Potassium: 3 mmol/L — ABNORMAL LOW (ref 3.5–5.1)
Sodium: 138 mmol/L (ref 135–145)

## 2021-10-09 LAB — CBC WITH DIFFERENTIAL/PLATELET
Abs Immature Granulocytes: 0.06 10*3/uL (ref 0.00–0.07)
Basophils Absolute: 0.1 10*3/uL (ref 0.0–0.1)
Basophils Relative: 1 %
Eosinophils Absolute: 0.1 10*3/uL (ref 0.0–0.5)
Eosinophils Relative: 2 %
HCT: 42.7 % (ref 36.0–46.0)
Hemoglobin: 14.2 g/dL (ref 12.0–15.0)
Immature Granulocytes: 1 %
Lymphocytes Relative: 11 %
Lymphs Abs: 0.9 10*3/uL (ref 0.7–4.0)
MCH: 31.8 pg (ref 26.0–34.0)
MCHC: 33.3 g/dL (ref 30.0–36.0)
MCV: 95.7 fL (ref 80.0–100.0)
Monocytes Absolute: 0.5 10*3/uL (ref 0.1–1.0)
Monocytes Relative: 6 %
Neutro Abs: 7 10*3/uL (ref 1.7–7.7)
Neutrophils Relative %: 79 %
Platelets: 216 10*3/uL (ref 150–400)
RBC: 4.46 MIL/uL (ref 3.87–5.11)
RDW: 14.8 % (ref 11.5–15.5)
WBC: 8.8 10*3/uL (ref 4.0–10.5)
nRBC: 0 % (ref 0.0–0.2)

## 2021-10-09 LAB — LACTIC ACID, PLASMA: Lactic Acid, Venous: 1.3 mmol/L (ref 0.5–1.9)

## 2021-10-09 MED ORDER — OXYCODONE-ACETAMINOPHEN 5-325 MG PO TABS
1.0000 | ORAL_TABLET | Freq: Once | ORAL | Status: AC
  Administered 2021-10-09: 1 via ORAL
  Filled 2021-10-09: qty 1

## 2021-10-09 MED ORDER — TORSEMIDE 20 MG PO TABS
40.0000 mg | ORAL_TABLET | ORAL | Status: AC
Start: 2021-10-09 — End: 2021-10-09
  Administered 2021-10-09: 40 mg via ORAL
  Filled 2021-10-09 (×2): qty 2

## 2021-10-09 MED ORDER — POTASSIUM CHLORIDE 20 MEQ PO PACK
60.0000 meq | PACK | ORAL | Status: AC
  Administered 2021-10-09: 60 meq via ORAL
  Filled 2021-10-09: qty 3

## 2021-10-09 MED ORDER — DEXTROSE 5 % IV SOLN
1500.0000 mg | Freq: Once | INTRAVENOUS | Status: AC
  Administered 2021-10-09: 1500 mg via INTRAVENOUS
  Filled 2021-10-09: qty 75

## 2021-10-09 NOTE — Progress Notes (Signed)
Left lower extremity venous duplex has been completed. Preliminary results can be found in CV Proc through chart review.  Results were given to Dr. Philip Aspen.  10/09/21 2:40 PM Christine Jacobson RVT

## 2021-10-09 NOTE — ED Provider Notes (Signed)
[facility] DEPT Provider Note   CSN: [id number] Arrival date & time: 10/09/21  4081     History  Chief Complaint  Patient presents with   Leg Swelling    Christine Jacobson is a 66 y.o. female.  66 year old female with a history of heart failure with preserved ejection fraction, AF on eliquis, HOCM, and venous insufficiency who presents to the emergency department with left lower extremity swelling and pain.  Patient states that she started having worsening bilateral lower extremity swelling several weeks ago after she was changed from Lasix to torsemide.  Says that her left lower extremity has become red and painful.  States that she completed a course of amoxicillin for cellulitis on Thursday and thought that it may have improved mildly but then started weeping and becoming more painful again today.  Denies any fevers, chills, or fatigue.  Thinks that she has been compliant with her torsemide but is unsure exactly how often she is supposed to take it.  Says that she has been compliant with her Eliquis recently.   Past Medical History:  Diagnosis Date   Chest pain 11/22/2010   2D STRESS ECHO - EF 60%, peak stress EF 80%, normal, no evidence for stress-induced ischemia   HOCM (hypertrophic obstructive cardiomyopathy) ([place]) 06/21/2011   2D ECHO - EF >55%, normal   HTN (hypertension)    Hyperprolactinemia ([place])    dx in her 18, took meds, self d/c a while back   Liver function test abnormality    normal when repeated   Mild hyperlipidemia    Obesity    Palpitations    negative stress echo in November 2012 with normal LV function; mild LVH, proximal septal thickening with narrow LVOT and mild gradient; mild MR and TR; Cardionet showed PACs in November 2012   Pyoderma gangrenosa    Shortness of breath 07/11/2011   MET TEST      Home Medications Prior to Admission medications   Medication Sig Start Date End Date Taking? Authorizing Provider   amoxicillin-clavulanate (AUGMENTIN) 500-125 MG tablet Take 1 tablet (500 mg total) by mouth 3 (three) times daily. 09/23/21   Adrian Prows, MD  apixaban (ELIQUIS) 5 MG TABS tablet Take 1 tablet (5 mg total) by mouth 2 (two) times daily. 03/30/21   [name], NP  diltiazem (CARDIZEM CD) 180 MG 24 hr capsule Take 1 capsule (180 mg total) by mouth in the morning and at bedtime. 09/09/21 12/08/21  Adrian Prows, MD  empagliflozin (JARDIANCE) 10 MG TABS tablet Take 1 tablet (10 mg total) by mouth daily before breakfast. 09/09/21   Adrian Prows, MD  Ibuprofen 200 MG CAPS Take 200-800 mg by mouth every 6 (six) hours as needed (headache or pain).     [provider]  metoprolol succinate (TOPROL-XL) 100 MG 24 hr tablet TAKE ONE TABLET BY MOUTH TWICE A DAY WITH OR IMMEDIATELY FOLLOWING A MEAL Patient taking differently: Take 100 mg by mouth in the morning, at noon, and at bedtime. 05/25/21   [name], [name], MD  potassium chloride SA (KLOR-CON M) 20 MEQ tablet Take 1 tablet (20 mEq total) by mouth 2 (two) times daily. 09/22/21   Adrian Prows, MD  torsemide (DEMADEX) 20 MG tablet Take 1 tablet (20 mg total) by mouth 2 (two) times daily at 10 am and 4 pm. 09/22/21   Adrian Prows, MD      Allergies    Patient has no known allergies.    Review of Systems  Review of Systems  Physical Exam Updated Vital Signs BP (!) 124/103   Pulse 60   Temp 97.9 F (36.6 C) (Oral)   Resp 20   Ht _0  (1.626 m)   Wt 95.3 kg   SpO2 96%   BMI 36.05 kg/m  Physical Exam Vitals and nursing note reviewed.  Constitutional:      General: She is not in acute distress.    Appearance: She is well-developed.  HENT:     Head: Normocephalic and atraumatic.     Right Ear: External ear normal.     Left Ear: External ear normal.     Nose: Nose normal.  Eyes:     Extraocular Movements: Extraocular movements intact.     Conjunctiva/sclera: Conjunctivae normal.     Pupils: Pupils are equal, round, and reactive to light.   Cardiovascular:     Rate and Rhythm: Regular rhythm. Bradycardia present.  Pulmonary:     Effort: Pulmonary effort is normal. No respiratory distress.  Abdominal:     General: Abdomen is flat. There is no distension.  Musculoskeletal:        General: No swelling.     Cervical back: Normal range of motion and neck supple.     Right lower leg: Edema present.     Left lower leg: Edema (erythema, no crepitance) present.  Skin:    General: Skin is warm and dry.     Capillary Refill: Capillary refill takes less than 2 seconds.  Neurological:     Mental Status: She is alert and oriented to person, place, and time. Mental status is at baseline.  Psychiatric:        Mood and Affect: Mood normal.        ED Results / Procedures / Treatments   Labs (all labs ordered are listed, but only abnormal results are displayed) Labs Reviewed  BASIC METABOLIC PANEL - Abnormal; Notable for the following components:      Result Value   Potassium 3.0 (*)    Glucose, Bld 137 (*)    BUN 46 (*)    Creatinine, Ser 1.51 (*)    GFR, Estimated 37 (*)    All other components within normal limits  CULTURE, BLOOD (ROUTINE X 2)  CBC WITH DIFFERENTIAL/PLATELET  LACTIC ACID, PLASMA    EKG None  Radiology VAS [place] LOWER EXTREMITY VENOUS (DVT) (ONLY [facility] & [facility])  Result Date: 10/09/2021  Lower Venous DVT Study Patient Name:  Christine Jacobson  Date of Exam:   10/09/2021 Medical Rec #: [id number]      Accession #:    [id number] Date of Birth: [birth year]      Patient Gender: F Patient Age:   71 years Exam Location:  [facility] Procedure:      VAS [place] LOWER EXTREMITY VENOUS (DVT) Referring Phys: Herbie Baltimore Giavonni Fonder --------------------------------------------------------------------------------  Indications: Swelling.  Risk Factors: None identified. Limitations: Body habitus and poor ultrasound/tissue interface. Comparison Study: No prior studies. Performing Technologist: Oliver Hum RVT  Examination Guidelines: A  complete evaluation includes B-mode imaging, spectral Doppler, color Doppler, and power Doppler as needed of all accessible portions of each vessel. Bilateral testing is considered an integral part of a complete examination. Limited examinations for reoccurring indications may be performed as noted. The reflux portion of the exam is performed with the patient in reverse Trendelenburg.  +-----+---------------+---------+-----------+----------+--------------+ RIGHTCompressibilityPhasicitySpontaneityPropertiesThrombus Aging +-----+---------------+---------+-----------+----------+--------------+ CFV  Full           Yes      Yes                                 +-----+---------------+---------+-----------+----------+--------------+   +---------+---------------+---------+-----------+----------+--------------+  LEFT     CompressibilityPhasicitySpontaneityPropertiesThrombus Aging +---------+---------------+---------+-----------+----------+--------------+ CFV      Full           Yes      Yes                                 +---------+---------------+---------+-----------+----------+--------------+ SFJ      Full                                                        +---------+---------------+---------+-----------+----------+--------------+ FV Prox  Full                                                        +---------+---------------+---------+-----------+----------+--------------+ FV Mid   Full                                                        +---------+---------------+---------+-----------+----------+--------------+ FV DistalFull                                                        +---------+---------------+---------+-----------+----------+--------------+ PFV      Full                                                        +---------+---------------+---------+-----------+----------+--------------+ POP      Full           Yes      Yes                                  +---------+---------------+---------+-----------+----------+--------------+ PTV      Full                                                        +---------+---------------+---------+-----------+----------+--------------+ PERO     Full                                                        +---------+---------------+---------+-----------+----------+--------------+    Summary: RIGHT: - No evidence of common femoral vein obstruction.  LEFT: - There is no evidence of deep vein thrombosis in the lower extremity. However, portions of this examination were limited- see technologist comments above.  - No cystic structure found in the  popliteal fossa.  *See table(s) above for measurements and observations.    Preliminary     Procedures Procedures   Medications Ordered in ED Medications  torsemide (DEMADEX) tablet 40 mg (40 mg Oral Given 10/09/21 1515)  potassium chloride (KLOR-CON) packet 60 mEq (60 mEq Oral Given 10/09/21 1430)  dalbavancin (DALVANCE) 1,500 mg in dextrose 5 % 500 mL IVPB (0 mg Intravenous Stopped 10/09/21 1549)  oxyCODONE-acetaminophen (PERCOCET/ROXICET) 5-325 MG per tablet 1 tablet (1 tablet Oral Given 10/09/21 1515)    ED Course/ Medical Decision Making/ A&P Clinical Course as of 10/09/21 1930  Sat Oct 09, 2021  1410 Notified by ultrasound tech that there is no left lower extremity DVT noted on her study. [RP]    Clinical Course User Index [RP] Fransico Meadow, MD                           Medical Decision Making Risk Prescription drug management.   Christine Jacobson is a 66 year old female with a history of heart failure with preserved ejection fraction, AF on eliquis, HOCM, and venous insufficiency who presents to the emergency department with left lower extremity swelling and pain.   Initial Ddx:  Cellulitis, DVT, Venous insufficiency, CHF   MDM:  Appears on exam that pt has venous insufficiency vs lymphedema on her LLE but also with overlying  cellulitis. Feel DVT less likely 2/2 the fact that the pt is on [facility] but will obtain US today to rule out since it appears that there is not a prior. Since she failed OP amoxicillin will treat with dalbavancin today. Considered worsening CHF but appears to be more unilateral.   Plan:  Labs DVT US Torsemide Dalbavancin  ED Summary:  DVT US without evidence of DVT. Pt was given torsemide with good response and had low K that was repleted. Also given dalbavancin with ID follow-up and instructions to fu with her PCP regarding repeat lab work and wound check.    Dispo: DC Home. Return precautions discussed including, but not limited to, those listed in the AVS. Allowed pt time to ask questions which were answered fully prior to dc.   Records reviewed Care Everywhere The following labs were independently interpreted: Chemistry  Final Clinical Impression(s) / ED Diagnoses Final diagnoses:  Leg edema  Cellulitis of left lower extremity    Rx / DC Orders ED Discharge Orders          Ordered    Ambulatory referral to Infectious Disease       Comments: Cellulitis patient:  Received dalbavancin on 10/09/2021.   10/09/21 1330              Fransico Meadow, MD 10/09/21 1930

## 2021-10-09 NOTE — ED Triage Notes (Signed)
Pt reports bilateral leg swelling x10 days. Was prescribed Augmentin 10 days ago for cellulitis. Pt reports drainage to left lower leg. Hx lymphedema

## 2021-10-09 NOTE — Discharge Instructions (Addendum)
Today you were seen in the emergency department for your leg swelling and infection.    In the emergency department you got a long acting antibiotic (dalbavancin).    At home, please continue your fluid pill as prescribed by your primary doctor and wear compression stockings.    Check your MyChart online for the results of any tests that had not resulted by the time you left the emergency department.   Follow-up with your primary doctor in 2-3 days regarding your visit and for a wound check of your leg and to have your kidney function checked.  You will also be contacted by the infectious disease team for a follow-up appointment with them.  Return immediately to the emergency department if you experience any of the following: Worsening pain, redness, fevers, or any other concerning symptoms.    Thank you for visiting our Emergency Department. It was a pleasure taking care of you today.

## 2021-10-09 NOTE — Progress Notes (Signed)
Pharmacy Note:  Dalbavancin for Acute Bacterial Skin and Skin Structure Infection (ABSSSI) Patients to [facility] Discharge Christine Jacobson is an 66 y.o. female who presented to [facility] on 10/09/2021 with an Acute Bacterial Skin and Skin Structure Infection  Inclusion criteria - Indication [x]  Moderately large skin lesion (>=75 cm2 or larger - about the size of a baseball) [x]  Cellulitis  Inclusion Criteria - at least one SIRS criteria present []  WBC > 12,000 or < 4000 []  temp >100.9 or < 96.8 []  heart rate >90[]  respiratory rate >20 - Failed outpatient antibiotic treatment.  Patient was evaluated for the following exclusion criteria and no exclusions were found  Hardware involvement, Hypotension / shock, Elevated lactate (>2) without other explanation, gram-negative infection risk factors (bites, water exposure, infection after trauma, infection after skin graft, neutropenia, burns, severe immunocompromise), necrotizing fasciitis possible or confirmed, Known or suspected osteomyelitis or septic arthritis, endocarditis, diabetic foot infection, ischemic ulcers, post-operative wound infection, perirectal infections, need for drainage in the operating room, hand or facial infections, injection drug users with a fever, bacteremia, pregnancy or breastfeeding, allergy to related antibiotics like vancomycin, known liver disease (t.bili >2x ULN or AST/ALT 3x ULN)   Martyna Thorns S. Alford Highland, PharmD, BCPS Clinical Staff Pharmacist [email or web address]  Wayland Salinas 10/09/2021, 1:46 PM Clinical Pharmacist

## 2021-10-13 ENCOUNTER — Emergency Department (HOSPITAL_COMMUNITY): Payer: Medicare Other

## 2021-10-13 ENCOUNTER — Encounter: Payer: Self-pay | Admitting: Cardiology

## 2021-10-13 ENCOUNTER — Ambulatory Visit: Payer: Medicare Other | Admitting: Cardiology

## 2021-10-13 ENCOUNTER — Other Ambulatory Visit: Payer: Self-pay

## 2021-10-13 ENCOUNTER — Encounter (HOSPITAL_COMMUNITY): Payer: Self-pay

## 2021-10-13 ENCOUNTER — Observation Stay (HOSPITAL_COMMUNITY)
Admission: EM | Admit: 2021-10-13 | Discharge: 2021-10-15 | Disposition: A | Discharge status: Home / Self Care | Payer: Medicare Other | Attending: Family Medicine | Admitting: Family Medicine

## 2021-10-13 VITALS — BP 124/80 | HR 70 | Temp 98.0°F | Resp 16 | Ht 64.0 in | Wt 213.4 lb

## 2021-10-13 DIAGNOSIS — I10 Essential (primary) hypertension: Secondary | ICD-10-CM | POA: Diagnosis present

## 2021-10-13 DIAGNOSIS — I4819 Other persistent atrial fibrillation: Secondary | ICD-10-CM | POA: Insufficient documentation

## 2021-10-13 DIAGNOSIS — L02416 Cutaneous abscess of left lower limb: Secondary | ICD-10-CM | POA: Diagnosis not present

## 2021-10-13 DIAGNOSIS — I421 Obstructive hypertrophic cardiomyopathy: Secondary | ICD-10-CM

## 2021-10-13 DIAGNOSIS — I5031 Acute diastolic (congestive) heart failure: Secondary | ICD-10-CM

## 2021-10-13 DIAGNOSIS — I272 Pulmonary hypertension, unspecified: Secondary | ICD-10-CM

## 2021-10-13 DIAGNOSIS — L03116 Cellulitis of left lower limb: Secondary | ICD-10-CM

## 2021-10-13 DIAGNOSIS — Z7984 Long term (current) use of oral hypoglycemic drugs: Secondary | ICD-10-CM | POA: Insufficient documentation

## 2021-10-13 DIAGNOSIS — I110 Hypertensive heart disease with heart failure: Secondary | ICD-10-CM | POA: Insufficient documentation

## 2021-10-13 DIAGNOSIS — N179 Acute kidney failure, unspecified: Secondary | ICD-10-CM | POA: Diagnosis not present

## 2021-10-13 DIAGNOSIS — Z87891 Personal history of nicotine dependence: Secondary | ICD-10-CM | POA: Insufficient documentation

## 2021-10-13 DIAGNOSIS — E782 Mixed hyperlipidemia: Secondary | ICD-10-CM | POA: Diagnosis present

## 2021-10-13 DIAGNOSIS — I5032 Chronic diastolic (congestive) heart failure: Secondary | ICD-10-CM | POA: Diagnosis present

## 2021-10-13 DIAGNOSIS — Z7901 Long term (current) use of anticoagulants: Secondary | ICD-10-CM | POA: Diagnosis not present

## 2021-10-13 DIAGNOSIS — R42 Dizziness and giddiness: Secondary | ICD-10-CM | POA: Diagnosis present

## 2021-10-13 DIAGNOSIS — I2729 Other secondary pulmonary hypertension: Secondary | ICD-10-CM

## 2021-10-13 DIAGNOSIS — E66812 Obesity, class 2: Secondary | ICD-10-CM

## 2021-10-13 DIAGNOSIS — L88 Pyoderma gangrenosum: Secondary | ICD-10-CM

## 2021-10-13 DIAGNOSIS — I5033 Acute on chronic diastolic (congestive) heart failure: Secondary | ICD-10-CM | POA: Diagnosis not present

## 2021-10-13 DIAGNOSIS — R600 Localized edema: Secondary | ICD-10-CM

## 2021-10-13 DIAGNOSIS — E785 Hyperlipidemia, unspecified: Secondary | ICD-10-CM | POA: Diagnosis present

## 2021-10-13 DIAGNOSIS — I480 Paroxysmal atrial fibrillation: Secondary | ICD-10-CM | POA: Diagnosis present

## 2021-10-13 LAB — CBC WITH DIFFERENTIAL/PLATELET
Abs Immature Granulocytes: 0.08 10*3/uL — ABNORMAL HIGH (ref 0.00–0.07)
Basophils Absolute: 0.1 10*3/uL (ref 0.0–0.1)
Basophils Relative: 1 %
Eosinophils Absolute: 0.3 10*3/uL (ref 0.0–0.5)
Eosinophils Relative: 3 %
HCT: 42.7 % (ref 36.0–46.0)
Hemoglobin: 14.5 g/dL (ref 12.0–15.0)
Immature Granulocytes: 1 %
Lymphocytes Relative: 10 %
Lymphs Abs: 0.8 10*3/uL (ref 0.7–4.0)
MCH: 32.2 pg (ref 26.0–34.0)
MCHC: 34 g/dL (ref 30.0–36.0)
MCV: 94.9 fL (ref 80.0–100.0)
Monocytes Absolute: 0.6 10*3/uL (ref 0.1–1.0)
Monocytes Relative: 7 %
Neutro Abs: 6.5 10*3/uL (ref 1.7–7.7)
Neutrophils Relative %: 78 %
Platelets: 223 10*3/uL (ref 150–400)
RBC: 4.5 MIL/uL (ref 3.87–5.11)
RDW: 15.1 % (ref 11.5–15.5)
WBC: 8.4 10*3/uL (ref 4.0–10.5)
nRBC: 0.2 % (ref 0.0–0.2)

## 2021-10-13 LAB — BASIC METABOLIC PANEL
Anion gap: 8 (ref 5–15)
BUN: 48 mg/dL — ABNORMAL HIGH (ref 8–23)
CO2: 26 mmol/L (ref 22–32)
Calcium: 9.8 mg/dL (ref 8.9–10.3)
Chloride: 102 mmol/L (ref 98–111)
Creatinine, Ser: 1.78 mg/dL — ABNORMAL HIGH (ref 0.44–1.00)
GFR, Estimated: 31 mL/min — ABNORMAL LOW (ref >60)
Glucose, Bld: 113 mg/dL — ABNORMAL HIGH (ref 70–99)
Potassium: 3.8 mmol/L (ref 3.5–5.1)
Sodium: 136 mmol/L (ref 135–145)

## 2021-10-13 LAB — BRAIN NATRIURETIC PEPTIDE: B Natriuretic Peptide: 675.6 pg/mL — ABNORMAL HIGH (ref 0.0–100.0)

## 2021-10-13 LAB — GLUCOSE, CAPILLARY: Glucose-Capillary: 145 mg/dL — ABNORMAL HIGH (ref 70–99)

## 2021-10-13 MED ORDER — DILTIAZEM HCL ER COATED BEADS 180 MG PO CP24
180.0000 mg | ORAL_CAPSULE | Freq: Two times a day (BID) | ORAL | Status: DC
  Administered 2021-10-13 – 2021-10-15 (×4): 180 mg via ORAL
  Filled 2021-10-13 (×4): qty 1

## 2021-10-13 MED ORDER — ACETAMINOPHEN 650 MG RE SUPP: 650.0000 mg | Freq: Four times a day (QID) | RECTAL | Status: DC | PRN

## 2021-10-13 MED ORDER — POTASSIUM CHLORIDE CRYS ER 20 MEQ PO TBCR
20.0000 meq | EXTENDED_RELEASE_TABLET | Freq: Two times a day (BID) | ORAL | Status: DC
  Administered 2021-10-13 – 2021-10-14 (×3): 20 meq via ORAL
  Filled 2021-10-13 (×3): qty 1

## 2021-10-13 MED ORDER — ONDANSETRON HCL 4 MG PO TABS: 4.0000 mg | ORAL_TABLET | Freq: Four times a day (QID) | ORAL | Status: DC | PRN

## 2021-10-13 MED ORDER — OXYCODONE HCL 5 MG PO TABS
5.0000 mg | ORAL_TABLET | ORAL | Status: DC | PRN
  Administered 2021-10-13 – 2021-10-15 (×4): 5 mg via ORAL
  Filled 2021-10-13 (×5): qty 1

## 2021-10-13 MED ORDER — BISACODYL 5 MG PO TBEC: 5.0000 mg | DELAYED_RELEASE_TABLET | Freq: Every day | ORAL | Status: DC | PRN

## 2021-10-13 MED ORDER — HYDRALAZINE HCL 20 MG/ML IJ SOLN: 5.0000 mg | INTRAMUSCULAR | Status: DC | PRN

## 2021-10-13 MED ORDER — FENTANYL CITRATE PF 50 MCG/ML IJ SOSY
50.0000 ug | PREFILLED_SYRINGE | Freq: Once | INTRAMUSCULAR | Status: AC
  Administered 2021-10-13: 50 ug via INTRAVENOUS
  Filled 2021-10-13: qty 1

## 2021-10-13 MED ORDER — SODIUM CHLORIDE 0.9 % IV SOLN
2.0000 g | INTRAVENOUS | Status: DC
  Administered 2021-10-13 – 2021-10-14 (×2): 2 g via INTRAVENOUS
  Filled 2021-10-13 (×2): qty 20

## 2021-10-13 MED ORDER — POLYETHYLENE GLYCOL 3350 17 G PO PACK: 17.0000 g | PACK | Freq: Every day | ORAL | Status: DC | PRN

## 2021-10-13 MED ORDER — FUROSEMIDE 10 MG/ML PO SOLN
40.0000 mg | Freq: Once | ORAL | Status: DC
  Filled 2021-10-13: qty 5

## 2021-10-13 MED ORDER — EMPAGLIFLOZIN 10 MG PO TABS
10.0000 mg | ORAL_TABLET | Freq: Every day | ORAL | Status: DC
  Administered 2021-10-13 – 2021-10-14 (×2): 10 mg via ORAL
  Filled 2021-10-13 (×2): qty 1

## 2021-10-13 MED ORDER — ACETAMINOPHEN 325 MG PO TABS
650.0000 mg | ORAL_TABLET | Freq: Four times a day (QID) | ORAL | Status: DC | PRN
  Administered 2021-10-13: 650 mg via ORAL
  Filled 2021-10-13: qty 2

## 2021-10-13 MED ORDER — HALOPERIDOL LACTATE 5 MG/ML IJ SOLN: 2.0000 mg | Freq: Four times a day (QID) | INTRAMUSCULAR | Status: DC | PRN

## 2021-10-13 MED ORDER — SODIUM CHLORIDE 0.9% FLUSH
3.0000 mL | Freq: Two times a day (BID) | INTRAVENOUS | Status: DC
  Administered 2021-10-14 – 2021-10-15 (×2): 3 mL via INTRAVENOUS

## 2021-10-13 MED ORDER — DOCUSATE SODIUM 100 MG PO CAPS
100.0000 mg | ORAL_CAPSULE | Freq: Two times a day (BID) | ORAL | Status: DC
  Administered 2021-10-13 – 2021-10-15 (×2): 100 mg via ORAL
  Filled 2021-10-13 (×4): qty 1

## 2021-10-13 MED ORDER — METOPROLOL SUCCINATE ER 100 MG PO TB24
100.0000 mg | ORAL_TABLET | Freq: Two times a day (BID) | ORAL | Status: DC
  Administered 2021-10-13 – 2021-10-15 (×4): 100 mg via ORAL
  Filled 2021-10-13 (×4): qty 1

## 2021-10-13 MED ORDER — MORPHINE SULFATE (PF) 2 MG/ML IV SOLN
2.0000 mg | INTRAVENOUS | Status: DC | PRN
  Administered 2021-10-14 – 2021-10-15 (×2): 2 mg via INTRAVENOUS
  Filled 2021-10-13 (×2): qty 1

## 2021-10-13 MED ORDER — ONDANSETRON HCL 4 MG/2ML IJ SOLN: 4.0000 mg | Freq: Four times a day (QID) | INTRAMUSCULAR | Status: DC | PRN

## 2021-10-13 MED ORDER — APIXABAN 5 MG PO TABS
5.0000 mg | ORAL_TABLET | Freq: Two times a day (BID) | ORAL | Status: DC
  Administered 2021-10-13 – 2021-10-15 (×4): 5 mg via ORAL
  Filled 2021-10-13 (×4): qty 1

## 2021-10-13 MED ORDER — TORSEMIDE 20 MG PO TABS
20.0000 mg | ORAL_TABLET | Freq: Two times a day (BID) | ORAL | Status: DC
  Administered 2021-10-13 – 2021-10-15 (×4): 20 mg via ORAL
  Filled 2021-10-13 (×4): qty 1

## 2021-10-13 NOTE — ED Triage Notes (Addendum)
Patient BIB [facility] from cardiology office for evaluation of intense dizziness and ongoing cellulitis in bilateral lower extremities. Patient has history of afib, is on eliquis. Denies nausea now but reports some nausea earlier this morning before dizziness started.  EMS Vitals BP 120/90 HR 56 SpO2 97% on room air

## 2021-10-13 NOTE — ED Notes (Signed)
Pt stated to cut pants d/t being painful to take pants off.

## 2021-10-13 NOTE — Progress Notes (Signed)
Primary Physician/Referring:  Patient, No Pcp Per  Patient ID: Christine Jacobson, female    DOB: 05/30/1955, 66 y.o.   MRN: [id number]  Chief Complaint  Patient presents with   Congestive Heart Failure   Edema   Follow-up   HPI:    Christine Jacobson  is a 66 y.o. 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  Patient is single and does not have any children or close relatives.  She has had a nonischemic nuclear stress test in August 2023 however TID was high and presumed to be due to subendocardial ischemia from HOCM..  I had seen her repeatedly for management of presumed diastolic heart failure, she has not gotten any labs done.  She continues to have 3-4+ leg edema, in fact I had mentioned that she needed to go to the emergency room as I could not manage her in the outpatient basis.  She did present to the ED on 10/09/2021 but was discharged home after ED evaluation. She still feels miserable overall and does not feel well.    Past Medical History:  Diagnosis Date   Chest pain 11/22/2010   2D STRESS ECHO - EF 60%, peak stress EF 80%, normal, no evidence for stress-induced ischemia   HOCM (hypertrophic obstructive cardiomyopathy) ([place]) 06/21/2011   2D ECHO - EF >55%, normal   HTN (hypertension)    Hyperprolactinemia (HCC)    dx in her 18, took meds, self d/c a while back   Liver function test abnormality    normal when repeated   Mild hyperlipidemia    Obesity    Palpitations    negative stress echo in November 2012 with normal LV function; mild LVH, proximal septal thickening with narrow LVOT and mild gradient; mild MR and TR; Cardionet showed PACs in November 2012   Pyoderma gangrenosa     Shortness of breath 07/11/2011   MET TEST   Past Surgical History:  Procedure Laterality Date   SKIN GRAFT  2006   porcine, R leg   Family History  Problem Relation Age of Onset   Heart disease Mother        endocarditis   Ovarian cancer Mother    Emphysema Father    Coronary artery disease Other        F in his 37   Diabetes Other        GP   Cancer Neg Hx     Social History   Tobacco Use   Smoking status: Never   Smokeless tobacco: Never  Substance Use Topics   Alcohol use: Yes    Alcohol/week: 1.0 standard drink of alcohol    Types: 1 Glasses of wine per week    Comment: wine everday   Marital Status: Single  ROS  Review of Systems  Constitutional: Positive for malaise/fatigue.  Cardiovascular:  Positive for dyspnea on exertion and leg swelling. Negative for chest pain.  Musculoskeletal:  Positive for arthritis and back pain.   Objective  Blood pressure 124/80, pulse 70, temperature 98 F (36.7 C), temperature source Temporal, resp. rate 16, height _0  (1.626 m), weight 213 lb 6.4 oz (96.8 kg), SpO2 93 %. Body mass index is 36.63 kg/m.     10/13/2021   11:42 AM 10/13/2021  9:56 AM 10/09/2021    3:03 PM  Vitals with BMI  Height  $Remov'5\' 4"'aTObdP$  $Remove'5\' 4"'PYhwljj$   Weight  213 lbs 6 oz 210 lbs  BMI  62.95 28.41  Systolic  324 401  Diastolic  80 027  Pulse  70 60     Information is confidential and restricted. Go to Review Flowsheets to unlock data.     Physical Exam Constitutional:      Appearance: She is obese.  Neck:     Vascular: No carotid bruit or JVD.  Cardiovascular:     Rate and Rhythm: Normal rate and regular rhythm.     Pulses:          Femoral pulses are 0 on the right side and 0 on the left side.      Popliteal pulses are 0 on the right side and 0 on the left side.       Dorsalis pedis pulses are 0 on the right side and 0 on the left side.       Posterior tibial pulses are 0 on the right side and 0 on the left side.     Heart sounds: Heart sounds are  distant. Murmur heard.     Midsystolic murmur is present with a grade of 2/6 at the upper right sternal border.     No gallop.     Comments: Could not feel her peripheral pulses due to nonpitting edema in bilateral lower extremity and obesity. Pulmonary:     Effort: Pulmonary effort is normal.     Breath sounds: Normal breath sounds.  Abdominal:     General: Bowel sounds are normal.     Palpations: Abdomen is soft.     Comments: Obese. Pannus present  Musculoskeletal:     Right lower leg: Edema (Nonpitting edema with chronic venous stasis changes and thick skin.) present.     Left lower leg: Edema (Nonpitting edema with chronic venous stasis changes and thick skin.) present.  Skin:    Findings: Lesion (cellulitis left shin) present.     Medications and allergies  No Known Allergies   Medication list after today's encounter   Current Outpatient Medications:    apixaban (ELIQUIS) 5 MG TABS tablet, Take 1 tablet (5 mg total) by mouth 2 (two) times daily., Disp: 60 tablet, Rfl: 5   diltiazem (CARDIZEM CD) 180 MG 24 hr capsule, Take 1 capsule (180 mg total) by mouth in the morning and at bedtime., Disp: 60 capsule, Rfl: 2   empagliflozin (JARDIANCE) 10 MG TABS tablet, Take 1 tablet (10 mg total) by mouth daily before breakfast., Disp: 30 tablet, Rfl: 1   Ibuprofen 200 MG CAPS, Take 200-800 mg by mouth every 6 (six) hours as needed (headache or pain). , Disp: , Rfl:    metoprolol succinate (TOPROL-XL) 100 MG 24 hr tablet, TAKE ONE TABLET BY MOUTH TWICE A DAY WITH OR IMMEDIATELY FOLLOWING A MEAL (Patient taking differently: Take 100 mg by mouth in the morning, at noon, and at bedtime.), Disp: 180 tablet, Rfl: 3   potassium chloride SA (KLOR-CON M) 20 MEQ tablet, Take 1 tablet (20 mEq total) by mouth 2 (two) times daily., Disp: 30 tablet, Rfl: 1   torsemide (DEMADEX) 20 MG tablet, Take 1 tablet (20 mg total) by mouth 2 (two) times daily at 10 am and 4 pm., Disp: 60 tablet, Rfl: 2  Laboratory  examination:   Lab Results  Component Value Date   NA 138 10/09/2021   K  3.0 (L) 10/09/2021   CO2 27 10/09/2021   GLUCOSE 137 (H) 10/09/2021   BUN 46 (H) 10/09/2021   CREATININE 1.51 (H) 10/09/2021   CALCIUM 10.3 10/09/2021   EGFR 53 (L) 03/29/2021   GFRNONAA 37 (L) 10/09/2021   estimated creatinine clearance is 40.2 mL/min (A) (by C-G formula based on SCr of 1.51 mg/dL (H)).     Latest Ref Rng & Units 10/09/2021   10:28 AM 03/29/2021    9:34 AM 07/18/2019    1:02 PM  CMP  Glucose 70 - 99 mg/dL 137  105  110   BUN 8 - 23 mg/dL 46  23  25   Creatinine 0.44 - 1.00 mg/dL 1.51  1.13  1.08   Sodium 135 - 145 mmol/L 138  143  143   Potassium 3.5 - 5.1 mmol/L 3.0  4.4  3.4   Chloride 98 - 111 mmol/L 100  104  106   CO2 22 - 32 mmol/L _0 Calcium 8.9 - 10.3 mg/dL 10.3  9.9  9.5       Latest Ref Rng & Units 10/09/2021   10:28 AM 03/29/2021    9:33 AM 07/18/2019    1:02 PM  CBC  WBC 4.0 - 10.5 K/uL 8.8  7.5  8.7   Hemoglobin 12.0 - 15.0 g/dL 14.2  13.2  12.5   Hematocrit 36.0 - 46.0 % 42.7  38.3  38.5   Platelets 150 - 400 K/uL 216  199  227    Lipid Panel No results for input(s): "CHOL", "TRIG", "[place]", "VLDL", "HDL", "CHOLHDL", "LDLDIRECT" in the last 8760 hours.  HEMOGLOBIN A1C No results found for: "HGBA1C", "MPG" TSH No results for input(s): "TSH" in the last 8760 hours.  Radiology:   Chest x-ray 07/18/2019: There is mild left midlung atelectasis. Lungs elsewhere are clear. Heart is upper normal in size with pulmonary vascularity normal. No adenopathy. No pneumothorax. There is degenerative change in the thoracic spine.  Cardiac Studies:   PCV ECHOCARDIOGRAM COMPLETE 09/02/2021  Narrative Echocardiogram 08/17/2021: 1. Normal LV systolic function with visual EF 60-65%. Left ventricle cavity is normal in size. Hypertrophic cardiomyopathy. Severe concentric hypertrophy of the left ventricle. Normal global wall motion. Ungraded restrictive diastolic  dysfunction, elevated LAP. 2. Left atrial cavity is severely dilated at 5.4 cm. 3. Right atrial cavity is moderately dilated. 4. Right ventricle cavity is mild to moderately dilated. Normal right ventricular function. 5. Mild (Grade I) mitral regurgitation. Systolic anterior motion of MV noted. Moderate mitral valve posterior leaflet calcification. E-wave dominant mitral inflow. 6. Structurally normal tricuspid valve. Moderate tricuspid regurgitation. Moderate pulmonary hypertension. RVSP measures 45 mmHg. 7. IVC is dilated with respiratory variation. 9. Recommend TEE.  Compared to the study done on 07/29/2017 report, RV dilatation is new.  Otherwise no significant change.  PCV MYOCARDIAL PERFUSION WITH LEXISCAN 08/23/2021  Myocardial perfusion is normal. Overall LV systolic function is normal without regional wall motion abnormalities. Stress LV EF: 63%. Abnormal ECG stress, positive for ischemic changes in inferior leads. TID ratio 1.28, which is abnormal therefore cannot r/o balanced ischemia given positive EKG and TID. No previous exam available for comparison. High risk study.    EKG:   EKG 09/09/2021: Atrial fibrillation with controlled ventricular response at rate of 76 bpm, incomplete right bundle branch block, nonspecific ST-T abnormality.  LVH.  No significant change from 07/28/2021.   Assessment     ICD-10-CM   1. HOCM (hypertrophic obstructive cardiomyopathy) ([place])  I42.1     2. Acute diastolic heart failure (HCC)  I50.31     3. Cellulitis of left leg  L03.116     4. Class 2 severe obesity due to excess calories with serious comorbidity and body mass index (BMI) of 37.0 to 37.9 in adult (Christine Jacobson)  E66.01    Z68.37     5. Persistent atrial fibrillation (HCC)  I48.19         No orders of the defined types were placed in this encounter.   No orders of the defined types were placed in this encounter.   Medications Discontinued During This Encounter  Medication Reason    amoxicillin-clavulanate (AUGMENTIN) 500-125 MG tablet      Recommendations:   Christine Jacobson is a 65 y.o.  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  Patient is single and does not have any children or close relatives.  She has had a nonischemic nuclear stress test in August 2023 however TID was high and presumed to be due to subendocardial ischemia from HOCM.Marland Kitchen  She is extremely depressed and anxious as well.  I offered her hospitalization for wound care and also heart failure management which she refused.  She promises to keep the appointment to get labs done which she has missed twice already.  I also offered her medications for anxiety and depression.  I had seen her repeatedly for management of presumed diastolic heart failure, she has not gotten any labs done.  She continues to have 3-4+ leg edema, in fact I had mentioned that she needed to go to the emergency room as I could not manage her in the outpatient basis.  She did present to the ED on 10/09/2021 but was discharged home after ED evaluation.  Today again she comes in with continued lower extremity edema, continued ulceration of her shin, request new antibiotics.  I reviewed her labs from the hospitalization ED visit, she has had acute renal failure, continues to have marked dyspnea even with minimal activity, hence I will not be able to treat her in the outpatient basis without knowing her lab values and also evaluate her for not just heart failure but renal failure as well and she will need wound care and probably IV antibiotics.  Patient literally fell on the floor in office trying to walk from the examination room to the checkout, I  have recommended that we call EMS and send her directly to the emergency room for hospitalization and further management.  I suspect she probably has sleep apnea but he canceled the appointment and does not want to evaluate any further.  She request that I do not add any new medications, do not send her to see any other physicians and she is "tired".   This was a 40-minute office visit encounter.  I spent a total of 39 minutes with the patient and discussing her major medical issues and hospitalization.  She seems to think that we keep giving her bad news and she is also thinking that the whole thing was a joke, as she is still not sure what exactly is happening to her.  I have offered her that she could certainly get an opinion from a different physician as well.   Adrian Prows, MD, [facility] 10/13/2021,  12:21 PM Office: [phone]

## 2021-10-13 NOTE — ED Provider Triage Note (Addendum)
Emergency Medicine Provider Triage Evaluation Note  Christine Jacobson , a 66 y.o. female  was evaluated in triage.  Pt complains of intermittent dizziness, left leg pain, and a feeling of unease.  Patient is unable at this time clarify what she means about the uneasy feeling.  She states she was at cardiology this morning for a follow-up appointment and felt dizzy when standing.  She states that while sitting her dizziness has improved.  She denies chest pain, shortness of breath at this time does complain of lower left leg pain due to a presumptive cellulitis.  She was in the emergency department on Saturday and was given a Dalvance injection.  No DVT was found on ultrasound at that visit.  Brief chart review shows appoint with Dr. Einar Gip of cardiology this morning.  He reportedly offered the patient hospitalization for either heart failure or wound care but the patient declined at that time.  He had recommended a 4-week follow-up appointment.  Patient with history of atrial fibrillation  Review of Systems  Positive: As above Negative: As above  Physical Exam  BP 115/69 (BP Location: Right Arm)   Pulse (!) 113   Temp 97.6 F (36.4 C) (Oral)   Resp 16   SpO2 95%  Gen:   Awake, no distress   Resp:  Normal effort  MSK:   Moves extremities without difficulty  Other:    Medical Decision Making  Medically screening exam initiated at 11:54 AM.  Appropriate orders placed.  Serin Mano was informed that the remainder of the evaluation will be completed by another provider, this initial triage assessment does not replace that evaluation, and the importance of remaining in the ED until their evaluation is complete.     Dorothyann Peng, PA-C 10/13/21 1155    Dorothyann Peng, PA-C 10/13/21 1156

## 2021-10-13 NOTE — ED Notes (Signed)
Pt lower extremities has +2 Edema bilaterally specifically on feet. Cellulitis on left lower leg with weeping

## 2021-10-13 NOTE — ED Provider Notes (Signed)
[facility] EMERGENCY DEPARTMENT Provider Note   CSN: [id number] Arrival date & time: 10/13/21  1128     History  Chief Complaint  Patient presents with   Dizziness    Christine Jacobson is a 66 y.o. female.   Dizziness    Patient with past medical history of hypertrophic obstructive cardiomyopathy, hypertension, hyperlipidemia, diastolic CHF, morbid obesity, PAF on Eliquis, moderate obesity presents today due to lower extremity pain and dyspnea on exertion.  Seen in cardiology office earlier today and advised likely admission for CHF exacerbation and cellulitis has been unresponsive to antibiotics.    Dyspnea on exertion has been worsening, she is no longer able to ambulate across the room without needing to rest.  She is having lower extremity swelling which has been worsening despite being on torsemide.  She was seen in the ED, started on antibiotics which has not been improving the lower extremity erythema.  Negative DVT study and she is anticoagulated on Eliquis currently.Marland Kitchen  No chest pain, fevers, chills, IV drug use, lateralized weakness or numbness.  Home Medications Prior to Admission medications   Medication Sig Start Date End Date Taking? Authorizing Provider  apixaban (ELIQUIS) 5 MG TABS tablet Take 1 tablet (5 mg total) by mouth 2 (two) times daily. 03/30/21   [name], NP  diltiazem (CARDIZEM CD) 180 MG 24 hr capsule Take 1 capsule (180 mg total) by mouth in the morning and at bedtime. 09/09/21 12/08/21  Adrian Prows, MD  empagliflozin (JARDIANCE) 10 MG TABS tablet Take 1 tablet (10 mg total) by mouth daily before breakfast. 09/09/21   Adrian Prows, MD  Ibuprofen 200 MG CAPS Take 200-800 mg by mouth every 6 (six) hours as needed (headache or pain).     [provider]  metoprolol succinate (TOPROL-XL) 100 MG 24 hr tablet TAKE ONE TABLET BY MOUTH TWICE A DAY WITH OR IMMEDIATELY FOLLOWING A MEAL Patient taking differently: Take 100 mg by mouth in the  morning, at noon, and at bedtime. 05/25/21   [name], [name], MD  potassium chloride SA (KLOR-CON M) 20 MEQ tablet Take 1 tablet (20 mEq total) by mouth 2 (two) times daily. 09/22/21   Adrian Prows, MD  torsemide (DEMADEX) 20 MG tablet Take 1 tablet (20 mg total) by mouth 2 (two) times daily at 10 am and 4 pm. 09/22/21   Adrian Prows, MD      Allergies    Patient has no known allergies.    Review of Systems   Review of Systems  Neurological:  Positive for dizziness.    Physical Exam Updated Vital Signs BP (!) 141/82   Pulse (!) 54   Temp 97.6 F (36.4 C) (Oral)   Resp 14   Ht 5\' 4"  (1.626 m)   Wt 96.8 kg   SpO2 100%   BMI 36.63 kg/m  Physical Exam Vitals and nursing note reviewed. Exam conducted with a chaperone present.  Constitutional:      Appearance: Normal appearance. She is obese. She is ill-appearing.  HENT:     Head: Normocephalic and atraumatic.  Eyes:     General: No scleral icterus.       Right eye: No discharge.        Left eye: No discharge.     Extraocular Movements: Extraocular movements intact.     Pupils: Pupils are equal, round, and reactive to light.  Cardiovascular:     Rate and Rhythm: Normal rate. Rhythm irregular.     Heart  sounds: Murmur heard.     No friction rub. No gallop.  Pulmonary:     Effort: Pulmonary effort is normal. No respiratory distress.     Breath sounds: Normal breath sounds.  Abdominal:     General: Abdomen is flat. Bowel sounds are normal. There is no distension.     Palpations: Abdomen is soft.     Tenderness: There is no abdominal tenderness.  Musculoskeletal:     Right lower leg: Edema present.     Left lower leg: Edema present.  Skin:    General: Skin is warm and dry.     Coloration: Skin is not jaundiced.     Findings: Erythema present.  Neurological:     Mental Status: She is alert. Mental status is at baseline.     Coordination: Coordination normal.      ED Results / Procedures / Treatments   Labs (all labs ordered  are listed, but only abnormal results are displayed) Labs Reviewed  BASIC METABOLIC PANEL - Abnormal; Notable for the following components:      Result Value   Glucose, Bld 113 (*)    BUN 48 (*)    Creatinine, Ser 1.78 (*)    GFR, Estimated 31 (*)    All other components within normal limits  CBC WITH DIFFERENTIAL/PLATELET - Abnormal; Notable for the following components:   Abs Immature Granulocytes 0.08 (*)    All other components within normal limits  BRAIN NATRIURETIC PEPTIDE - Abnormal; Notable for the following components:   B Natriuretic Peptide 675.6 (*)    All other components within normal limits    EKG None  Radiology No results found.  Procedures Procedures    Medications Ordered in ED Medications  furosemide (LASIX) 10 MG/ML solution 40 mg (has no administration in time range)  fentaNYL (SUBLIMAZE) injection 50 mcg (50 mcg Intravenous Given 10/13/21 1433)    ED Course/ Medical Decision Making/ A&P Clinical Course as of 10/13/21 1501  Wed Oct 13, 2021  1230 Brain natriuretic peptide [JM]  1448 DG Chest 2 View I viewed chest x-ray which is negative for any acute process. [HS]    Clinical Course User Index [HS] Sherrill Raring, PA-C [JM] Lyn Henri Bynum Bellows                           Medical Decision Making Amount and/or Complexity of Data Reviewed Radiology:  Decision-making details documented in ED Course.  Risk Prescription drug management. Decision regarding hospitalization.   Patient presents due to DOE and bilateral lower extremity pain and erythema.  Differential includes but not limited to CHF exacerbation, venous insufficiency, cellulitis, sepsis  Patient has multiple comorbidities complicating care today as detailed HPI.  I reviewed external medical records.   ---Patient seen in ED 10/09/2021 due to lower extremity erythema and swelling, negative DVT studies bilaterally given Dalbavancin.  Previously was on amoxicillin with no improvement.   Lactic acid was negative, blood cultures were negative. ---Patient seen earlier today in the office by her cardiologist Dr. Einar Gip and was offered admission for wound management and CHF exacerbation which she declined.    I ordered, viewed and interpreted laboratory work-up. CBC without leukocytosis or anemia. BMP without gross electrolyte derangement.  Patient has worsening renal function and AKI, creatinine 1.78.  It was elevated at 1.5 at previous ED visit which is significantly increased compared to baseline of normal renal function. BNP elevated at 675.6  Patient is on  cardiac monitoring, patient is in atrial fibrillation with bradycardic rhythm.  EKG shows atrial fibrillation with bradycardia per my interpretation.  I ordered Lasix and fentanyl for pain.  Reviewed patient's home medication list  I ordered and reviewed interpreted chest x-ray.  No I considered repeat blood culture but given no growth 3 days ago I do not think indicated.  Consider repeat lactic with patient is afebrile and does not meet SIRS criteria, negative lactic 3 days ago as well.  Will obtain at this time.  Patient has failed outpatient antibiotics and Dalvance.  She is having worsening lower extremity edema, worsening renal function and concern for worsening lower extremity edema in setting of CHF.  I think patient will need admission, I will consult cardiology to see if they would like to admit to their service versus hospitalist service.  I consulted with cardiology, awaiting callback his cardiologist Dr. Terri Skains on call for [facility] - he is currently in a procedure, will call back -Spoke with Dr. Terri Skains, cardiology will consult but advises admission to hospitalist service.  I spoke with Dr. Lorin [facility] who agrees with admitting the patient.        Final Clinical Impression(s) / ED Diagnoses Final diagnoses:  Acute on chronic diastolic congestive heart failure (HCC)  Cellulitis of left lower extremity  AKI  (acute kidney injury) [facility])    Rx / DC Orders ED Discharge Orders     None         Sherrill Raring, PA-C 10/13/21 1501    Tretha Sciara, MD 10/14/21 [phone]

## 2021-10-13 NOTE — H&P (Signed)
History and Physical    Patient: Christine Jacobson WSF:[id number] DOB: 11-27-1955 DOA: 10/13/2021 DOS: the patient was seen and examined on 10/13/2021 PCP: Patient, No Pcp Per  Patient coming from: Home - lives alone; NOK: No one   Chief Complaint: Edema  HPI: Christine Jacobson is a 66 y.o. female with medical history significant of HOCM; chronic diastolic CHF; afib on Eliquis; HTN; HLD; pyoderma gangrenosa; and hyperprolactinemia presenting with worsening edema, LE wounds.  She went ot see Dr. Einar Gip.  Her leg wasn't improving, was at [facility] Saturday and he said she should have been admitted so he convinced her to come back for admission.  No SOB.  The wound looks terrible, worse than this AM.  The wound has been there in total for maybe a month.  No fevers.  No prior h/o non-healing wounds.  No orthopnea.    During my evaluation of the patient, it was clear that she was frustrated, did not want to answer questions.  She became extremely upset about the fact that she appears to have an infection and was angry that she was not admitted Saturday at [facility].  I explained that I could not say whether she needed hospitalization at that time, only that she does now.  I told her that I was sorry that she is frustrated.  She began screaming and shaking her fists at me.  I then asked if she has concerns about her mood and she told me that was an inappropriate question.  I asked about SI/HI and she appeared to deny those things.    ER Course:  CHF exacerbation and LE edema.  3rd ED visit - treated with Augmentin and Dalvance.  Worsening edema and wounds.  Negative DVT US.  Worsening DOE.  Sent by cardiologist.  Creatinine 1.6, 1.5 on 9/23, normal baseline.  Cultures and lactate negative last week, no SIRS criteria.  Cards will see.  Given Lasix and Fentanyl.     Review of Systems: As mentioned in the history of present illness. All other systems reviewed and are negative. Past Medical History:  Diagnosis Date   Chest  pain 11/22/2010   2D STRESS ECHO - EF 60%, peak stress EF 80%, normal, no evidence for stress-induced ischemia   HOCM (hypertrophic obstructive cardiomyopathy) ([place]) 06/21/2011   2D ECHO - EF >55%, normal   HTN (hypertension)    Hyperprolactinemia ([place])    dx in her 18, took meds, self d/c a while back   Liver function test abnormality    normal when repeated   Mild hyperlipidemia    Obesity    Palpitations    negative stress echo in November 2012 with normal LV function; mild LVH, proximal septal thickening with narrow LVOT and mild gradient; mild MR and TR; Cardionet showed PACs in November 2012   Pyoderma gangrenosa    Shortness of breath 07/11/2011   MET TEST   Past Surgical History:  Procedure Laterality Date   SKIN GRAFT  2006   porcine, R leg   Social History:  reports that she has quit smoking. Her smoking use included cigarettes. She has a 4.00 pack-year smoking history. She has never used smokeless tobacco. She reports current alcohol use of about 1.0 standard drink of alcohol per week. She reports that she does not use drugs.  No Known Allergies  Family History  Problem Relation Age of Onset   Heart disease Mother        endocarditis   Ovarian cancer Mother  Emphysema Father    Coronary artery disease Other        F in his 57   Diabetes Other        GP   Cancer Neg Hx     Prior to Admission medications   Medication Sig Start Date End Date Taking? Authorizing Provider  apixaban (ELIQUIS) 5 MG TABS tablet Take 1 tablet (5 mg total) by mouth 2 (two) times daily. 03/30/21   [name], NP  diltiazem (CARDIZEM CD) 180 MG 24 hr capsule Take 1 capsule (180 mg total) by mouth in the morning and at bedtime. 09/09/21 12/08/21  Adrian Prows, MD  empagliflozin (JARDIANCE) 10 MG TABS tablet Take 1 tablet (10 mg total) by mouth daily before breakfast. 09/09/21   Adrian Prows, MD  Ibuprofen 200 MG CAPS Take 200-800 mg by mouth every 6 (six) hours as needed (headache or pain).      [provider]  metoprolol succinate (TOPROL-XL) 100 MG 24 hr tablet TAKE ONE TABLET BY MOUTH TWICE A DAY WITH OR IMMEDIATELY FOLLOWING A MEAL Patient taking differently: Take 100 mg by mouth in the morning, at noon, and at bedtime. 05/25/21   [name], [name], MD  potassium chloride SA (KLOR-CON M) 20 MEQ tablet Take 1 tablet (20 mEq total) by mouth 2 (two) times daily. 09/22/21   Adrian Prows, MD  torsemide (DEMADEX) 20 MG tablet Take 1 tablet (20 mg total) by mouth 2 (two) times daily at 10 am and 4 pm. 09/22/21   Adrian Prows, MD    Physical Exam: Vitals:   10/13/21 1430 10/13/21 1645 10/13/21 1648 10/13/21 1709  BP: (!) 141/82 127/78  (!) 148/90  Pulse: (!) 54 (!) 101  63  Resp: 14 16  18   Temp:   97.9 F (36.6 C) 98 F (36.7 C)  TempSrc:    Oral  SpO2: 100% 100%  99%  Weight:      Height:       General:  Appears hostile and is in NAD Eyes:  EOMI, normal lids, iris ENT:  grossly normal hearing, lips & tongue, mmm Neck:  no LAD, masses or thyromegaly Cardiovascular:  Irregularly irregular, no m/r/g.  Respiratory:   CTA bilaterally with no wheezes/rales/rhonchi.  Normal respiratory effort. Abdomen:  soft, NT, ND Skin:  LLE with erythema of the entire lower leg but purulent-appearing changes along the posterior lower leg; stasis-appearing changes of B lower legs      Musculoskeletal:  grossly normal tone BUE/BLE, good ROM, no bony abnormality Psychiatric:  cantankerous mood and affect, speech hostile but appropriate, AOx3 Neurologic:  CN 2-12 grossly intact, moves all extremities in coordinated fashion   Radiological Exams on Admission: Independently reviewed - see discussion in A/P where applicable  DG Chest 2 View  Result Date: 10/13/2021 CLINICAL DATA:  Weakness, dizziness. EXAM: CHEST - 2 VIEW COMPARISON:  July 18, 2019. FINDINGS: Stable cardiomediastinal silhouette. Both lungs are clear. The visualized skeletal structures are unremarkable. IMPRESSION: No active  cardiopulmonary disease. Electronically Signed   By: Marijo Conception M.D.   On: 10/13/2021 12:36    EKG: Independently reviewed. Afib with rate 52; nonspecific ST changes with no evidence of acute ischemia   Labs on Admission: I have personally reviewed the available labs and imaging studies at the time of the admission.  Pertinent labs:    Glucose 113 BUN 48/Creatinine 1.78/GFR 31; 46/1.51/37 on 9/23; 23/1.13/53 in March Normal CBC BNP 675.6; 395 in 2018   Assessment and  Plan: Principal Problem:   Cellulitis and abscess of left lower extremity Active Problems:   Mild hyperlipidemia   Chronic diastolic heart failure (HCC)   Primary hypertension   Persistent atrial fibrillation (HCC)    LLE Cellulitis -Patient with B stasis with dermatitis presenting with marked erythema and posterior ulceration of the LLE -She does have a h/o pyoderma gangrenosum and is s/p grafting on the LLE; this can recur and occur on the contralateral side so should be considered if she is not improving with current treatment -Normal WBC count -She was not given antibiotics in the ER -Will treat with Rocephin based on the cellulitis order set -Will observe for now on telemetry  Chronic diastolic CHF with HOCM -She was sent by her cardiology office but does not appear grossly volume overloaded at this time -Will continue home diuretic, Demadex -Continue Jardiance, BB -Appears to be compensated but cardiology has been consulted  Afib -Rate controlled with Toprol XL, diltiazem -Continue Eliquis  HTN -Continue Toprol XL, Diltiazem  HLD -She does not appear to be taking medications for this issue at this time   Mood disturbance -She was quite hostile and even aggressive at the time of admission -She lives alone and was unable to name a NOK -There are RF identified here for mood disorder -Her behavior was threatening even though she denied SI/HI -When asked about code status, she yelled, "I JUST  DON'T CARE" -Will request psychiatry consult  Obesity -Body mass index is 36.63 kg/m..  -Weight loss should be encouraged -Outpatient PCP/bariatric medicine f/u encouraged     Advance Care Planning:   Code Status: Full Code   Consults: Psychiatry; cardiology  DVT Prophylaxis: Eliquis  Family Communication: None present; she declined to have me call anyone at the time of admission  Severity of Illness: The appropriate patient status for this patient is OBSERVATION. Observation status is judged to be reasonable and necessary in order to provide the required intensity of service to ensure the patient's safety. The patient's presenting symptoms, physical exam findings, and initial radiographic and laboratory data in the context of their medical condition is felt to place them at decreased risk for further clinical deterioration. Furthermore, it is anticipated that the patient will be medically stable for discharge from the hospital within 2 midnights of admission.   Author: Karmen Bongo, MD 10/13/2021 6:40 PM  For on call review [email or web address].

## 2021-10-13 NOTE — ED Notes (Signed)
Pt requested RN to cut pants off

## 2021-10-14 ENCOUNTER — Observation Stay (HOSPITAL_COMMUNITY): Payer: Medicare Other

## 2021-10-14 DIAGNOSIS — L03116 Cellulitis of left lower limb: Secondary | ICD-10-CM | POA: Diagnosis not present

## 2021-10-14 DIAGNOSIS — I4819 Other persistent atrial fibrillation: Secondary | ICD-10-CM

## 2021-10-14 DIAGNOSIS — R451 Restlessness and agitation: Secondary | ICD-10-CM | POA: Diagnosis not present

## 2021-10-14 DIAGNOSIS — I5032 Chronic diastolic (congestive) heart failure: Secondary | ICD-10-CM | POA: Diagnosis not present

## 2021-10-14 DIAGNOSIS — I5033 Acute on chronic diastolic (congestive) heart failure: Secondary | ICD-10-CM | POA: Diagnosis not present

## 2021-10-14 DIAGNOSIS — F39 Unspecified mood [affective] disorder: Secondary | ICD-10-CM

## 2021-10-14 DIAGNOSIS — I10 Essential (primary) hypertension: Secondary | ICD-10-CM

## 2021-10-14 LAB — HIV ANTIBODY (ROUTINE TESTING W REFLEX): HIV Screen 4th Generation wRfx: NONREACTIVE

## 2021-10-14 LAB — BASIC METABOLIC PANEL
Anion gap: 12 (ref 5–15)
BUN: 42 mg/dL — ABNORMAL HIGH (ref 8–23)
CO2: 24 mmol/L (ref 22–32)
Calcium: 9.6 mg/dL (ref 8.9–10.3)
Chloride: 101 mmol/L (ref 98–111)
Creatinine, Ser: 1.8 mg/dL — ABNORMAL HIGH (ref 0.44–1.00)
GFR, Estimated: 30 mL/min — ABNORMAL LOW (ref >60)
Glucose, Bld: 129 mg/dL — ABNORMAL HIGH (ref 70–99)
Potassium: 3.7 mmol/L (ref 3.5–5.1)
Sodium: 137 mmol/L (ref 135–145)

## 2021-10-14 LAB — CBC
HCT: 39.1 % (ref 36.0–46.0)
Hemoglobin: 13.1 g/dL (ref 12.0–15.0)
MCH: 31.6 pg (ref 26.0–34.0)
MCHC: 33.5 g/dL (ref 30.0–36.0)
MCV: 94.4 fL (ref 80.0–100.0)
Platelets: 208 10*3/uL (ref 150–400)
RBC: 4.14 MIL/uL (ref 3.87–5.11)
RDW: 15.2 % (ref 11.5–15.5)
WBC: 9 10*3/uL (ref 4.0–10.5)
nRBC: 0 % (ref 0.0–0.2)

## 2021-10-14 LAB — LIPID PANEL
Cholesterol: 232 mg/dL — ABNORMAL HIGH (ref 0–200)
HDL: 52 mg/dL (ref >40)
LDL Cholesterol: 142 mg/dL — ABNORMAL HIGH (ref 0–99)
Total CHOL/HDL Ratio: 4.5 RATIO
Triglycerides: 191 mg/dL — ABNORMAL HIGH (ref <150)
VLDL: 38 mg/dL (ref 0–40)

## 2021-10-14 LAB — CULTURE, BLOOD (ROUTINE X 2)
Culture: NO GROWTH
Special Requests: ADEQUATE

## 2021-10-14 LAB — BRAIN NATRIURETIC PEPTIDE: B Natriuretic Peptide: 639.2 pg/mL — ABNORMAL HIGH (ref 0.0–100.0)

## 2021-10-14 MED ORDER — SODIUM CHLORIDE 0.9% FLUSH
3.0000 mL | Freq: Two times a day (BID) | INTRAVENOUS | Status: DC
  Administered 2021-10-15: 3 mL via INTRAVENOUS

## 2021-10-14 MED ORDER — FUROSEMIDE 10 MG/ML IJ SOLN
40.0000 mg | Freq: Once | INTRAMUSCULAR | Status: AC
  Administered 2021-10-14: 40 mg via INTRAVENOUS
  Filled 2021-10-14: qty 4

## 2021-10-14 MED ORDER — DIAZEPAM 5 MG PO TABS: 5.0000 mg | ORAL_TABLET | Freq: Every day | ORAL | Status: DC

## 2021-10-14 MED ORDER — MEDIHONEY WOUND/BURN DRESSING EX PSTE
1.0000 | PASTE | Freq: Every day | CUTANEOUS | Status: DC
  Administered 2021-10-14 – 2021-10-15 (×2): 1 via TOPICAL
  Filled 2021-10-14: qty 44

## 2021-10-14 MED ORDER — SERTRALINE HCL 50 MG PO TABS
50.0000 mg | ORAL_TABLET | Freq: Every day | ORAL | Status: DC
  Filled 2021-10-14: qty 1

## 2021-10-14 NOTE — Care Management Obs Status (Signed)
[facility] NOTIFICATION   Patient Details  Name: Rachael Ferrie MRN: [id number] Date of Birth: 07-Jan-1956   Medicare Observation Status Notification Given:  Yes    Zenon Mayo, RN 10/14/2021, 1:46 PM

## 2021-10-14 NOTE — Evaluation (Signed)
Occupational Therapy Evaluation Patient Details Name: Christine Jacobson MRN: [id number] DOB: February 06, 1955 Today's Date: 10/14/2021   History of Present Illness 66 y.o. female with medical history significant of HOCM; chronic diastolic CHF; afib on Eliquis; HTN; HLD; pyoderma gangrenosa; and hyperprolactinemia presenting with worsening edema, LE wounds.   Clinical Impression   Patient admitted for the diagnosis above.  She presents with decreased activity tolerance and pain with her left foot with mobility.  At baseline she lives in a apartment with communal kitchen and living room.  The patient has a separate bedroom and bathroom.  She continues to work, drive and manage her own ADL,iADL.  The patient states she does not use an AD for mobility.  The patient has a full flight of stairs to reach her apartment.  Currently she is very close to her baseline, she is needing increased time and rest breaks due to the deficits mentioned above.  No OT needs exist in the acute setting, but she would benefit from continued mobility while in the hospital.        Recommendations for follow up therapy are one component of a multi-disciplinary discharge planning process, led by the attending physician.  Recommendations may be updated based on patient status, additional functional criteria and insurance authorization.   Follow Up Recommendations  No OT follow up    Assistance Recommended at Discharge PRN  Patient can return home with the following Assist for transportation    Functional Status Assessment  Patient has not had a recent decline in their functional status  Equipment Recommendations  Tub/shower seat    Recommendations for Other Services  PT Consult     Precautions / Restrictions Precautions Precautions: Fall Restrictions Weight Bearing Restrictions: No      Mobility Bed Mobility Overal bed mobility: Independent                  Transfers Overall transfer level: Independent                  General transfer comment: walking to bathroom without assist.  Walked the halls.  Able to place socks without assist.      Balance Overall balance assessment: Mild deficits observed, not formally tested                                         ADL either performed or assessed with clinical judgement   ADL                                         General ADL Comments: Generalized supervision     Vision Baseline Vision/History: 1 Wears glasses Patient Visual Report: No change from baseline       Perception Perception Perception: Within Functional Limits   Praxis Praxis Praxis: Intact    Pertinent Vitals/Pain Pain Assessment Pain Assessment: Faces Faces Pain Scale: Hurts little more Pain Location: L foot with mobility. Pain Descriptors / Indicators: Nagging Pain Intervention(s): Limited activity within patient's tolerance     Hand Dominance Right   Extremity/Trunk Assessment Upper Extremity Assessment Upper Extremity Assessment: Overall WFL for tasks assessed   Lower Extremity Assessment Lower Extremity Assessment: Defer to PT evaluation   Cervical / Trunk Assessment Cervical / Trunk Assessment: Kyphotic   Communication Communication Communication: No difficulties  Cognition Arousal/Alertness: Awake/alert Behavior During Therapy: Anxious Overall Cognitive Status: Within Functional Limits for tasks assessed                                       General Comments  short stride    Exercises     Shoulder Instructions      Home Living Family/patient expects to be discharged to:: Other (Comment) (Patient has her own room and bathroom with communal kitchen/living room.  States it was a dorm for students at one time.) Living Arrangements: Non-relatives/Friends Available Help at Discharge: Friend(s);Available PRN/intermittently Type of Home: House Home Access: Stairs to enter [employer] of Steps: full flight Entrance Stairs-Rails: Left Home Layout: One level     Bathroom Shower/Tub: Tub/shower unit;Sponge bathes at baseline   [employer]: Standard Bathroom Accessibility: Yes How Accessible: Accessible via walker Home Equipment: None          Prior Functioning/Environment Prior Level of Function : Independent/Modified Independent;Working/employed;Driving               ADLs Comments: No assist for ADL, iADL, 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        OT Problem List: Pain;Decreased activity tolerance      OT Treatment/Interventions:      OT Goals(Current goals can be found in the care plan section) Acute Rehab OT Goals Patient Stated Goal: Return home OT Goal Formulation: With patient Time For Goal Achievement: 10/22/21 Potential to Achieve Goals: Good  OT Frequency:      Co-evaluation              AM-PAC OT "6 Clicks" Daily Activity     Outcome Measure Help from another person eating meals?: None Help from another person taking care of personal grooming?: None Help from another person toileting, which includes using toliet, bedpan, or urinal?: None Help from another person bathing (including washing, rinsing, drying)?: None Help from another person to put on and taking off regular upper body clothing?: None Help from another person to put on and taking off regular lower body clothing?: None 6 Click Score: 24   End of Session Nurse Communication: Mobility status  Activity Tolerance: Patient tolerated treatment well Patient left: in chair;with call bell/phone within reach  OT Visit Diagnosis: Pain Pain - Right/Left: Left Pain - part of body: Ankle and joints of foot                Time: 3159-4585 OT Time Calculation (min): 18 min Charges:  OT General Charges $OT Visit: 1 Visit OT Evaluation $OT Eval Moderate Complexity: 1 Mod  10/14/2021  RP, OTR/L  Acute Rehabilitation Services  Office:   [phone]   Suzanna Obey 10/14/2021, 4:27 PM

## 2021-10-14 NOTE — Progress Notes (Signed)
   10/14/21 1310  Clinical Encounter Type  Visited With Patient  Visit Type Initial;Spiritual support  Referral From Nurse  Consult/Referral To Chaplain   Chaplain responded to a request for a rabbi to visit with patient. I received the names of two rabbi's at [facility]. I am waiting for a call back.  I met with the patient, Christine Jacobson, and explained where I was in securing a rabbi. [name] appreciated the update and looks forward to seeing the rabbi. [name] also shared that was very nervous and was not her usual friendly self with the medical team she encounters yesterday.  She expressed that she was so scared and behaved badly. [name] expressed remorse for her actions and that she was in a better frame of mind today. I thanked [name] for her explanation and shared that the medical staff appreciates her acknowledgement. I assured her that as soon as I know something more I will let her know.   [name] [facility]  [phone]

## 2021-10-14 NOTE — Consult Note (Addendum)
[facility] Nurse Consult Note: Reason for Consult: Consult requested for bilat legs.  Performed remotely after review of progress notes and photos in the EMR.  Pt has a history of heart disease with chronic edema and also pyoderma gangrenosum. Wound type: Bilat lower legs with cellulitis; left leg more affected then right. Both with generalized edema and erythremia, mod amt yellow drainage. Left posterior leg with full thickness wound with yellow moist slough.  Dressing procedure/placement/frequency: Topical treatment orders provided for bedside nurses to perform as follows to absorb drainage, assist with removal of nonviable tissue, and reduce edema as follows: Apply Medihoney to left posterior leg Q day, and Xeroform gauze to any other open weeping areas on bilat legs Q day, then cover with ABD pads and kerlex, beginning just behind toes, to below knees, then ace wrap in the same manner.  Please re-consult if further assistance is needed.  Thank-you,  Julien Girt MSN, [place], [place], [place], [place]

## 2021-10-14 NOTE — Consult Note (Addendum)
[facility] Face-to-Face Psychiatry Consult   Reason for Consult:  Mood d/o with significant agitation Referring Physician:  Lorin Mercy Patient Identification: Christine Jacobson MRN:  [id number] Principal Diagnosis: Cellulitis and abscess of left lower extremity Diagnosis:  Principal Problem:   Cellulitis and abscess of left lower extremity Active Problems:   Mild hyperlipidemia   Chronic diastolic heart failure ([place])   Primary hypertension   Persistent atrial fibrillation (HCC)   Total Time spent with patient: 15 minutes  Subjective:   Christine Jacobson is a 66 y.o. female was seen and evaluated face-to-face by this provider due to consult request for evaluation of "mood disorder and significant agitation."   Christine Jacobson  presents with a bright and pleasant affect.  Reports feeling upset and overwhelmed on yesterday due to prolonged admission to a hospital bed/unit.  She reports her primary doctor requested that she be admitted last Saturday however she  was just recently admitted. Christine Jacobson stated  "  I was just frustrated and taking my anger out on the other doctor with the blond hair."   Christine Jacobson denied that she is followed by therapy or psychiatry currently.  Denies auditory or visual hallucinations.  Denied suicidal or homicidal ideations.  Denied previous inpatient admissions.  Reports occasional alcohol use with wine. Stated " all I need is vodka or glass of wine and I will be fine."  denies depression or depressive symptoms however, during this assessment patient became tearful and guarded.  Christine Jacobson reports she resides alone.  Denies any additional family support.  Denies children.  She is requesting to speak to a [place].  Declined initiating any and antidepressants and or the need for anxiolytics during this evaluation.  Attempted to screen patient using the PHQ9/ GAD 7 questionnaire, However patient's responses were short and abrupt  "no" responses. She stated that " I know you have a job to do but I am fine."    During evaluation Christine Jacobson is siting in bed in no acute distress.She is alert/oriented x 4; calm/cooperative; and mood congruent with affect.  She is speaking in a clear tone at moderate volume, and normal pace; with good eye contact. Her thought process is coherent and relevant; There is no indication that she is currently responding to internal/external stimuli or experiencing delusional thought content; and she has denied suicidal/self-harm/homicidal ideation, psychosis, and paranoia.  Patient has remained calm throughout assessment and has answered questions appropriately.    NP will follow-up with TOC for consult  with Christine Jacobson.  Consider reconsulting as needed.  Case staffed with attending psychiatrist Dwyane Dee.  Support, encouragement and reassurance was provided  HPI: Per admission assessment note noted 09/22/2021 "Rhealyn Arline  is a 66 y.o. 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  Patient is single and does not have any children or close relatives."  Past Psychiatric History:  Denied history with mental illness, denied previous inpatient admissions.  Denied that she is prescribed any psychotropic medications.  Does report occasional alcohol use.  Denied illicit substance abuse history.  Risk to Self:   Risk to Others:   Prior Inpatient Therapy:   Prior Outpatient Therapy:    Past Medical History:  Past Medical History:  Diagnosis Date   Chest pain 11/22/2010   2D STRESS ECHO - EF 60%, peak  stress EF 80%, normal, no evidence for stress-induced ischemia   HOCM (hypertrophic obstructive cardiomyopathy) ([place]) 06/21/2011   2D ECHO - EF >55%, normal   HTN (hypertension)     Hyperprolactinemia (HCC)    dx in her 18, took meds, self d/c a while back   Liver function test abnormality    normal when repeated   Mild hyperlipidemia    Obesity    Palpitations    negative stress echo in November 2012 with normal LV function; mild LVH, proximal septal thickening with narrow LVOT and mild gradient; mild MR and TR; Cardionet showed PACs in November 2012   Pyoderma gangrenosa    Shortness of breath 07/11/2011   MET TEST    Past Surgical History:  Procedure Laterality Date   SKIN GRAFT  2006   porcine, R leg   Family History:  Family History  Problem Relation Age of Onset   Heart disease Mother        endocarditis   Ovarian cancer Mother    Emphysema Father    Coronary artery disease Other        F in his 70   Diabetes Other        GP   Cancer Neg Hx    Family Psychiatric  History:  Social History:  Social History   Substance and Sexual Activity  Alcohol Use Yes   Alcohol/week: 1.0 standard drink of alcohol   Types: 1 Glasses of wine per week   Comment: "I do have my wine" every day, has cut down from 2 bottles at a time, currently 2-3 glasses     Social History   Substance and Sexual Activity  Drug Use No    Social History   Socioeconomic History   Marital status: Single    Spouse name: Not on file   Number of children: 0   Years of education: Not on file   Highest education level: Not on file  Occupational History   Occupation: para Statistician, part time cook  Tobacco Use   Smoking status: Former    Packs/day: 1.00    Years: 4.00    Total pack years: 4.00    Types: Cigarettes   Smokeless tobacco: Never  Vaping Use   Vaping Use: Never used  Substance and Sexual Activity   Alcohol use: Yes    Alcohol/week: 1.0 standard drink of alcohol    Types: 1 Glasses of wine per week    Comment: "I do have my wine" every day, has cut down from 2 bottles at a time, currently 2-3 glasses   Drug use: No   Sexual activity: Not Currently   Other Topics Concern   Not on file  Social History Narrative   Lives by self   Social Determinants of Health   Financial Resource Strain: Not on file  Food Insecurity: Not on file  Transportation Needs: Not on file  Physical Activity: Not on file  Stress: Not on file  Social Connections: Not on file   Additional Social History:    Allergies:  No Known Allergies  Labs:  Results for orders placed or performed during the hospital encounter of 10/13/21 (from the past 48 hour(s))  Basic metabolic panel     Status: Abnormal   Collection Time: 10/13/21 12:06 PM  Result Value Ref Range   Sodium 136 135 - 145 mmol/L   Potassium 3.8 3.5 - 5.1 mmol/L   Chloride 102 98 - 111 mmol/L   CO2 26  22 - 32 mmol/L   Glucose, Bld 113 (H) 70 - 99 mg/dL    Comment: Glucose reference range applies only to samples taken after fasting for at least 8 hours.   BUN 48 (H) 8 - 23 mg/dL   Creatinine, Ser 1.78 (H) 0.44 - 1.00 mg/dL   Calcium 9.8 8.9 - 10.3 mg/dL   GFR, Estimated 31 (L) >60 mL/min    Comment: (NOTE) Calculated using the CKD-EPI Creatinine Equation (2021)    Anion gap 8 5 - 15    Comment: Performed at [facility] [address] [place], [place] [address]  CBC with Differential     Status: Abnormal   Collection Time: 10/13/21 12:06 PM  Result Value Ref Range   WBC 8.4 4.0 - 10.5 K/uL   RBC 4.50 3.87 - 5.11 MIL/uL   Hemoglobin 14.5 12.0 - 15.0 g/dL   HCT 42.7 36.0 - 46.0 %   MCV 94.9 80.0 - 100.0 fL   MCH 32.2 26.0 - 34.0 pg   MCHC 34.0 30.0 - 36.0 g/dL   RDW 15.1 11.5 - 15.5 %   Platelets 223 150 - 400 K/uL   nRBC 0.2 0.0 - 0.2 %   Neutrophils Relative % 78 %   Neutro Abs 6.5 1.7 - 7.7 K/uL   Lymphocytes Relative 10 %   Lymphs Abs 0.8 0.7 - 4.0 K/uL   Monocytes Relative 7 %   Monocytes Absolute 0.6 0.1 - 1.0 K/uL   Eosinophils Relative 3 %   Eosinophils Absolute 0.3 0.0 - 0.5 K/uL   Basophils Relative 1 %   Basophils Absolute 0.1 0.0 - 0.1 K/uL   Immature  Granulocytes 1 %   Abs Immature Granulocytes 0.08 (H) 0.00 - 0.07 K/uL    Comment: Performed at [facility] [address] [place], [place] [address]  Brain natriuretic peptide     Status: Abnormal   Collection Time: 10/13/21 12:06 PM  Result Value Ref Range   B Natriuretic Peptide 675.6 (H) 0.0 - 100.0 pg/mL    Comment: Performed at [facility] [address] [place], [place] [address]  Glucose, capillary     Status: Abnormal   Collection Time: 10/13/21  9:55 PM  Result Value Ref Range   Glucose-Capillary 145 (H) 70 - 99 mg/dL    Comment: Glucose reference range applies only to samples taken after fasting for at least 8 hours.  HIV Antibody (routine testing w rflx)     Status: None   Collection Time: 10/14/21 12:42 AM  Result Value Ref Range   HIV Screen 4th Generation wRfx Non Reactive Non Reactive    Comment: Performed at [facility], [place] [address], [place] [address]  CBC     Status: None   Collection Time: 10/14/21 12:42 AM  Result Value Ref Range   WBC 9.0 4.0 - 10.5 K/uL   RBC 4.14 3.87 - 5.11 MIL/uL   Hemoglobin 13.1 12.0 - 15.0 g/dL   HCT 39.1 36.0 - 46.0 %   MCV 94.4 80.0 - 100.0 fL   MCH 31.6 26.0 - 34.0 pg   MCHC 33.5 30.0 - 36.0 g/dL   RDW 15.2 11.5 - 15.5 %   Platelets 208 150 - 400 K/uL   nRBC 0.0 0.0 - 0.2 %    Comment: Performed at [facility], [place] [address], [place], [place] [address]  Brain natriuretic peptide     Status: Abnormal  Collection Time: 10/14/21 12:42 AM  Result Value Ref Range   B Natriuretic Peptide 639.2 (H) 0.0 - 100.0 pg/mL    Comment: Performed at [facility] [address], [place], [place] [address]  Basic metabolic panel     Status: Abnormal   Collection Time: 10/14/21 12:42 AM  Result Value Ref Range   Sodium 137 135 - 145 mmol/L   Potassium 3.7 3.5 - 5.1 mmol/L   Chloride 101 98 - 111 mmol/L   CO2 24 22 - 32 mmol/L   Glucose, Bld 129 (H) 70 - 99 mg/dL    Comment: Glucose reference  range applies only to samples taken after fasting for at least 8 hours.   BUN 42 (H) 8 - 23 mg/dL   Creatinine, Ser 1.80 (H) 0.44 - 1.00 mg/dL   Calcium 9.6 8.9 - 10.3 mg/dL   GFR, Estimated 30 (L) >60 mL/min    Comment: (NOTE) Calculated using the CKD-EPI Creatinine Equation (2021)    Anion gap 12 5 - 15    Comment: Performed at [facility] [address], [place], [place] [address]  Lipid panel     Status: Abnormal   Collection Time: 10/14/21 12:42 AM  Result Value Ref Range   Cholesterol 232 (H) 0 - 200 mg/dL   Triglycerides 191 (H) <150 mg/dL   HDL 52 >40 mg/dL   Total CHOL/HDL Ratio 4.5 RATIO   VLDL 38 0 - 40 mg/dL   LDL Cholesterol 142 (H) 0 - 99 mg/dL    Comment:        Total Cholesterol/HDL:CHD Risk Coronary Heart Disease Risk Table                     Men   Women  1/2 Average Risk   3.4   3.3  Average Risk       5.0   4.4  2 X Average Risk   9.6   7.1  3 X Average Risk  23.4   11.0        Use the calculated Patient Ratio above and the CHD Risk Table to determine the patient's CHD Risk.        ATP III CLASSIFICATION (LDL):  <100     mg/dL   Optimal  100-129  mg/dL   Near or Above                    Optimal  130-159  mg/dL   Borderline  160-189  mg/dL   High  >190     mg/dL   Very High Performed at [facility] [address], [place], [place] [address]     Current Facility-Administered Medications  Medication Dose Route Frequency Provider Last Rate Last Admin   acetaminophen (TYLENOL) tablet 650 mg  650 mg Oral Q6H PRN Karmen Bongo, MD   650 mg at 10/13/21 1954   Or   acetaminophen (TYLENOL) suppository 650 mg  650 mg Rectal Q6H PRN Karmen Bongo, MD       apixaban Arne Cleveland) tablet 5 mg  5 mg Oral BID Karmen Bongo, MD   5 mg at 10/14/21 0854   bisacodyl (DULCOLAX) EC tablet 5 mg  5 mg Oral Daily PRN Karmen Bongo, MD       cefTRIAXone (ROCEPHIN) 2 g in sodium chloride 0.9 % 100 mL IVPB  2 g Intravenous Q24H Karmen Bongo, MD 200  mL/hr at 10/13/21 1746 2 g at 10/13/21  1746   diltiazem (CARDIZEM CD) 24 hr capsule 180 mg  180 mg Oral BID Karmen Bongo, MD   180 mg at 10/14/21 7482   docusate sodium (COLACE) capsule 100 mg  100 mg Oral BID Karmen Bongo, MD   100 mg at 10/13/21 2224   empagliflozin (JARDIANCE) tablet 10 mg  10 mg Oral Daily Adrian Prows, MD   10 mg at 10/14/21 0854   haloperidol lactate (HALDOL) injection 2 mg  2 mg Intravenous Q6H PRN Karmen Bongo, MD       hydrALAZINE (APRESOLINE) injection 5 mg  5 mg Intravenous Q4H PRN Karmen Bongo, MD       leptospermum manuka honey (MEDIHONEY) paste 1 Application  1 Application Topical Daily Mariel Aloe, MD       metoprolol succinate (TOPROL-XL) 24 hr tablet 100 mg  100 mg Oral BID Karmen Bongo, MD   100 mg at 10/14/21 0854   morphine (PF) 2 MG/ML injection 2 mg  2 mg Intravenous Q2H PRN Karmen Bongo, MD       ondansetron [facility]) tablet 4 mg  4 mg Oral Q6H PRN Karmen Bongo, MD       Or   ondansetron [facility]) injection 4 mg  4 mg Intravenous Q6H PRN Karmen Bongo, MD       oxyCODONE (Oxy IR/ROXICODONE) immediate release tablet 5 mg  5 mg Oral Q4H PRN Karmen Bongo, MD   5 mg at 10/13/21 2223   polyethylene glycol (MIRALAX / GLYCOLAX) packet 17 g  17 g Oral Daily PRN Karmen Bongo, MD       potassium chloride SA (KLOR-CON M) CR tablet 20 mEq  20 mEq Oral BID Karmen Bongo, MD   20 mEq at 10/14/21 0853   sodium chloride flush (NS) 0.9 % injection 3 mL  3 mL Intravenous Lillia Mountain, MD   3 mL at 10/14/21 0856   torsemide (DEMADEX) tablet 20 mg  20 mg Oral BID Karmen Bongo, MD   20 mg at 10/14/21 7078    Musculoskeletal:             Psychiatric Specialty Exam:  Presentation  General Appearance: Appropriate for Environment Eye Contact:Good Speech:Clear and Coherent Speech Volume:Normal Handedness:Right  Mood and Affect  Mood:Anxious Affect:Congruent  Thought Process  Thought Processes:Coherent Descriptions  of Associations:Intact  Orientation:Full (Time, Place and Person)  Thought Content:Logical  History of Schizophrenia/Schizoaffective disorder:No data recorded Duration of Psychotic Symptoms:No data recorded Hallucinations:Hallucinations: None  Ideas of Reference:None  Suicidal Thoughts:Suicidal Thoughts: No  Homicidal Thoughts:Homicidal Thoughts: No   Sensorium  Memory:Immediate Good; Recent Good; Remote Good Judgment:Fair Insight:Fair  Executive Functions  Concentration:Fair Attention Span:Good [employer] of Knowledge:Good Language:Good  Psychomotor Activity  Psychomotor Activity:Psychomotor Activity: Normal  Assets  Assets:Desire for Improvement; Armed forces logistics/support/administrative officer; Intimacy  Sleep  Sleep:Sleep: Fair  Physical Exam: Physical Exam Vitals and nursing note reviewed.  Cardiovascular:     Rate and Rhythm: Normal rate and regular rhythm.  Neurological:     Mental Status: She is oriented to person, place, and time.  Psychiatric:        Mood and Affect: Mood normal.        Behavior: Behavior normal.        Thought Content: Thought content normal.    Review of Systems  Eyes: Negative.   Gastrointestinal: Negative.   Musculoskeletal: Negative.   Endo/Heme/Allergies: Negative.   Psychiatric/Behavioral:  Negative for suicidal ideas. Depression: mild.The patient is not nervous/anxious.   All other systems reviewed  and are negative.  Blood pressure (!) 112/100, pulse 70, temperature 98.2 F (36.8 C), temperature source Oral, resp. rate 18, height 5' 4"  (1.626 m), weight 95.6 kg, SpO2 97 %. Body mass index is 36.18 kg/m.  Treatment Plan Summary: Plan orders placed for TOC to initiate follow-up with Christine Jacobson  Patient to consider initiating antidepressant for mood stabilization-however declined at this time.  -Patient is considering therapy services at discharge pending follow-up with Christine Jacobson.   Disposition: Patient does not meet criteria for psychiatric  inpatient admission.  Derrill [place], NP 10/14/2021 10:02 AM

## 2021-10-14 NOTE — TOC Progression Note (Addendum)
Transition of Care [facility]) - Progression Note    Patient Details  Name: Christine Jacobson MRN: [id number] Date of Birth: 03/06/1955  [facility] CM/SW Contact  Zenon Mayo, RN Phone Number: 10/14/2021, 11:11 AM  Clinical Narrative:    TOC received consult for patient to speak to a [place],  NCM pageed the Chaplain, they will look into , to see if they can set this up for patient. n From home alone, indep, HF, LLe cellulits, WOC to see, Pysch consulted, see note. TOC following.        Expected Discharge Plan and Services                                                 Social Determinants of Health (SDOH) Interventions    Readmission Risk Interventions     No data to display

## 2021-10-14 NOTE — Progress Notes (Signed)
PROGRESS NOTE    Christine Jacobson  SFK:[id number] DOB: 03-01-1955 DOA: 10/13/2021 PCP: Patient, No Pcp Per   Brief Narrative: Christine Jacobson is a 66 y.o. female with a history of HOCM, chronic diastolic heart failure, atrial fibrillation on Eliquis, hypertension, hyperlipidemia, pyoderma gangrenosa and hyperprolactinemia. Patient presented from her cardiologist's office secondary to edema and left leg cellulitis. Patient started empirically on Ceftriaxone.   Assessment and Plan:  Left lower extremity cellulitis Secondary to chronic LE dermatitis/wounds. Afebrile. No cultures obtained. Started empirically on Ceftriaxone. -Left leg x-ray/CT scan -Continue Ceftriaxone  Chronic diastolic heart failure HOCM No overt evidence of acute heart failure.  -Continue torsemide  Atrial fibrillation -Continue diltiazem and Eliquis  AKI Baseline creatinine is difficult to tease. Creatinine of 1.13 from March 2023 but more recent creatinine of 1.51 from 9/23. Creatinine of 1.78 on admission and is stable. -Repeat BMP in AM -Lasix 40 mg IV x1  Primary hypertension -Continue Toprol XL  Mood disorder Psychiatry consulted. Patient started on Zoloft  Obesity Body mass index is 36.18 kg/m.  DVT prophylaxis: Eliquis Code Status:   Code Status: Full Code Family Communication: None at bedside Disposition Plan: Discharge likely in 24 hours   Consultants:  None  Procedures:  None  Antimicrobials: Ceftriaxone    Subjective: Patient reports that her cardiologist said she needs to stay in the hospital. She reports left lower leg pain. She reports no issues with ambulation, however. Afebrile overnight.  Objective: BP (!) 112/100 (BP Location: Right Arm)   Pulse 70   Temp 98.2 F (36.8 C) (Oral)   Resp 18   Ht 5\' 4"  (1.626 m)   Wt 95.6 kg   SpO2 97%   BMI 36.18 kg/m   Examination:  General exam: Appears anxious and comfortable Respiratory system: Clear to auscultation.  Respiratory effort normal. Cardiovascular system: S1 & S2 heard, RRR. Gastrointestinal system: Abdomen is nondistended, soft and nontender. Normal bowel sounds heard. Central nervous system: Alert and oriented. No focal neurological deficits. Musculoskeletal: Bilateral LE edema. No calf tenderness Skin: Left lower extremity with mild erythema and posterior lower leg with wound and overlying slough   Data Reviewed: I have personally reviewed following labs and imaging studies  CBC Lab Results  Component Value Date   WBC 9.0 10/14/2021   RBC 4.14 10/14/2021   HGB 13.1 10/14/2021   HCT 39.1 10/14/2021   MCV 94.4 10/14/2021   MCH 31.6 10/14/2021   PLT 208 10/14/2021   MCHC 33.5 10/14/2021   RDW 15.2 10/14/2021   LYMPHSABS 0.8 10/13/2021   MONOABS 0.6 10/13/2021   EOSABS 0.3 10/13/2021   BASOSABS 0.1 17/49/4496     Last metabolic panel Lab Results  Component Value Date   NA 137 10/14/2021   K 3.7 10/14/2021   CL 101 10/14/2021   CO2 24 10/14/2021   BUN 42 (H) 10/14/2021   CREATININE 1.80 (H) 10/14/2021   GLUCOSE 129 (H) 10/14/2021   GFRNONAA 30 (L) 10/14/2021   GFRAA >60 07/18/2019   CALCIUM 9.6 10/14/2021   PROT 7.1 05/26/2011   ALBUMIN 3.8 05/26/2011   BILITOT 0.7 05/26/2011   ALKPHOS 111 05/26/2011   AST 83 (H) 05/26/2011   ALT 128 (H) 05/26/2011   ANIONGAP 12 10/14/2021    GFR: Estimated Creatinine Clearance: 33.6 mL/min (A) (by C-G formula based on SCr of 1.8 mg/dL (H)).  Recent Results (from the past 240 hour(s))  Culture, blood (routine x 2)     Status: None   Collection Time:  10/09/21 10:28 AM   Specimen: BLOOD  Result Value Ref Range Status   Specimen Description   Final    BLOOD RIGHT ANTECUBITAL Performed at [facility] [address], [place], [place] [address]    Special Requests   Final    BOTTLES DRAWN AEROBIC AND ANAEROBIC Blood Culture adequate volume Performed at [facility] [address]  [place], [place] [address]    Culture   Final    NO GROWTH 5 DAYS Performed at [facility], [place] [address] [place], [place] [address]    Report Status 10/14/2021 FINAL  Final      Radiology Studies: DG Tibia/Fibula Left  Result Date: 10/14/2021 CLINICAL DATA:  Cellulitis left lower leg. EXAM: LEFT TIBIA AND FIBULA - 2 VIEW COMPARISON:  None Available. FINDINGS: Negative for fracture or osteomyelitis.  No skeletal abnormality Soft tissue swelling around the ankle most prominent medially. IMPRESSION: No acute skeletal abnormality Electronically Signed   By: Franchot Gallo M.D.   On: 10/14/2021 12:26   DG Chest 2 View  Result Date: 10/13/2021 CLINICAL DATA:  Weakness, dizziness. EXAM: CHEST - 2 VIEW COMPARISON:  July 18, 2019. FINDINGS: Stable cardiomediastinal silhouette. Both lungs are clear. The visualized skeletal structures are unremarkable. IMPRESSION: No active cardiopulmonary disease. Electronically Signed   By: Marijo Conception M.D.   On: 10/13/2021 12:36      LOS: 0 days    Cordelia Poche, MD [facility] 10/14/2021, 4:22 PM   If 7PM-7AM, please contact night-coverage [email or web address]

## 2021-10-14 NOTE — Consult Note (Addendum)
CARDIOLOGY CONSULT NOTE  Patient ID: Christine Christine MRN: [id number] DOB/AGE: [birth year] 66 y.o.  Admit date: 10/13/2021 Referring Physician  Karmen Bongo, MD Primary Physician:  Patient, No Pcp Per Reason for Consultation  CHF  Patient ID: Christine Christine, female    DOB: 07-26-1955, 66 y.o.   MRN: [id number]  Chief Complaint  Patient presents with   Dizziness  Shortness of breath Leg swelling HPI:    Christine Christine  is a 66 y.o. 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  Patient is single and does not have any children or close relatives.  She has had a nonischemic nuclear stress test in August 2023 however TID was high and presumed to be due to subendocardial ischemia from HOCM..   I had seen her repeatedly for management of presumed diastolic heart failure, she has not gotten any labs done.  She continues to have 3-4+ leg edema, in fact I had mentioned that she needed to go to the emergency room as I could not manage her in the outpatient basis.  She did present to the ED on 10/09/2021 but was discharged home after ED evaluation. She still feels miserable overall and does not feel well.    States that she has been in the hospital yesterday, appreciates care given to her, apologized to her "bad behavior" last night in the emergency room.  No significant change in overall how she feels with regard to generalized fatigue, leg edema, shortness of breath.  Denies PND or orthopnea or chest pain or palpitations.  Past Medical History:  Diagnosis Date   Chest pain 11/22/2010   2D STRESS ECHO - EF 60%, peak stress EF 80%, normal, no evidence for stress-induced ischemia   HOCM (hypertrophic  obstructive cardiomyopathy) ([place]) 06/21/2011   2D ECHO - EF >55%, normal   HTN (hypertension)    Hyperprolactinemia ([place])    dx in her 18, took meds, self d/c a while back   Liver function test abnormality    normal when repeated   Mild hyperlipidemia    Obesity    Palpitations    negative stress echo in November 2012 with normal LV function; mild LVH, proximal septal thickening with narrow LVOT and mild gradient; mild MR and TR; Cardionet showed PACs in November 2012   Pyoderma gangrenosa    Shortness of breath 07/11/2011   MET TEST   Past Surgical History:  Procedure Laterality Date   SKIN GRAFT  2006   porcine, R leg   Social History   Tobacco Use   Smoking status: Former    Packs/day: 1.00    Years: 4.00    Total pack years: 4.00    Types: Cigarettes   Smokeless tobacco: Never  Substance Use Topics   Alcohol use: Yes    Alcohol/week: 1.0 standard drink of alcohol    Types: 1 Glasses of wine per week    Comment: "I do have my wine" every day, has cut down from 2 bottles at a time, currently 2-3 glasses    Family History  Problem Relation Age of Onset   Heart disease Mother        endocarditis   Ovarian cancer Mother    Emphysema Father    Coronary artery disease Other  F in his 58   Diabetes Other        GP   Cancer Neg Hx     Marital Status: Single  ROS  Review of Systems  Cardiovascular:  Positive for dyspnea on exertion and leg swelling. Negative for chest pain.   Objective      10/14/2021    8:52 AM 10/14/2021    5:39 AM 10/13/2021    8:08 PM  Vitals with BMI  Weight  210 lbs 13 oz   BMI  21.11   Systolic 552 080 223  Diastolic 361 81 80  Pulse 70 64 68    Blood pressure (!) 112/100, pulse 70, temperature 98.2 F (36.8 C), temperature source Oral, resp. rate 18, height 5' 4"  (1.626 m), weight 95.6 kg, SpO2 97 %.    Physical Exam Constitutional:      Appearance: She is obese.  Neck:     Vascular: Carotid bruit and JVD present.   Cardiovascular:     Rate and Rhythm: Regular rhythm. Bradycardia present.     Heart sounds: Murmur heard.     Crescendo mid to late systolic murmur is present with a grade of 3/6 at the upper right sternal border.     No gallop.     Comments: Unable to feel her pedal pulse.  Pulmonary:     Effort: Pulmonary effort is normal.     Breath sounds: Normal breath sounds.  Abdominal:     General: Bowel sounds are normal.     Palpations: Abdomen is soft.  Musculoskeletal:     Right lower leg: Edema ((Nonpitting edema with chronic venous stasis changes and thick skin.)) present.     Left lower leg: Edema ((Nonpitting edema with chronic venous stasis changes and thick skin.) Ulcerations on the shin of her legs noted) present.    Laboratory examination:   Recent Labs    10/09/21 1028 10/13/21 1206 10/14/21 0042  NA 138 136 137  K 3.0* 3.8 3.7  CL 100 102 101  CO2 27 26 24   GLUCOSE 137* 113* 129*  BUN 46* 48* 42*  CREATININE 1.51* 1.78* 1.80*  CALCIUM 10.3 9.8 9.6  GFRNONAA 37* 31* 30*   estimated creatinine clearance is 33.6 mL/min (A) (by C-G formula based on SCr of 1.8 mg/dL (H)).     Latest Ref Rng & Units 10/14/2021   12:42 AM 10/13/2021   12:06 PM 10/09/2021   10:28 AM  CMP  Glucose 70 - 99 mg/dL 129  113  137   BUN 8 - 23 mg/dL 42  48  46   Creatinine 0.44 - 1.00 mg/dL 1.80  1.78  1.51   Sodium 135 - 145 mmol/L 137  136  138   Potassium 3.5 - 5.1 mmol/L 3.7  3.8  3.0   Chloride 98 - 111 mmol/L 101  102  100   CO2 22 - 32 mmol/L 24  26  27    Calcium 8.9 - 10.3 mg/dL 9.6  9.8  10.3       Latest Ref Rng & Units 10/14/2021   12:42 AM 10/13/2021   12:06 PM 10/09/2021   10:28 AM  CBC  WBC 4.0 - 10.5 K/uL 9.0  8.4  8.8   Hemoglobin 12.0 - 15.0 g/dL 13.1  14.5  14.2   Hematocrit 36.0 - 46.0 % 39.1  42.7  42.7   Platelets 150 - 400 K/uL 208  223  216    Lipid Panel Recent Labs  10/14/21 0042  CHOL 232*  TRIG 191*  LDLCALC 142*  VLDL 38  HDL 52  CHOLHDL 4.5     HEMOGLOBIN A1C No results found for: "HGBA1C", "MPG" TSH No results for input(s): "TSH" in the last 8760 hours. BNP (last 3 results) Recent Labs    10/13/21 1206 10/14/21 0042  BNP 675.6* 639.2*   Cardiac Panel (last 3 results) No results for input(s): "CKTOTAL", "CKMB", "TROPONINIHS", "RELINDX" in the last 72 hours.   Medications and allergies  No Known Allergies   Current Meds  Medication Sig   acetaminophen (TYLENOL) 325 MG tablet Take 650 mg by mouth every 6 (six) hours as needed for mild pain or moderate pain.   amoxicillin-clavulanate (AUGMENTIN) 500-125 MG tablet Take 1 tablet by mouth 3 (three) times daily.   apixaban (ELIQUIS) 5 MG TABS tablet Take 1 tablet (5 mg total) by mouth 2 (two) times daily.   diltiazem (CARDIZEM CD) 180 MG 24 hr capsule Take 1 capsule (180 mg total) by mouth in the morning and at bedtime.   empagliflozin (JARDIANCE) 10 MG TABS tablet Take 1 tablet (10 mg total) by mouth daily before breakfast.   Ibuprofen 200 MG CAPS Take 200-800 mg by mouth every 6 (six) hours as needed (headache or pain).    metoprolol succinate (TOPROL-XL) 100 MG 24 hr tablet TAKE ONE TABLET BY MOUTH TWICE A DAY WITH OR IMMEDIATELY FOLLOWING A MEAL (Patient taking differently: Take 100 mg by mouth in the morning and at bedtime.)   potassium chloride SA (KLOR-CON M) 20 MEQ tablet Take 1 tablet (20 mEq total) by mouth 2 (two) times daily.   torsemide (DEMADEX) 20 MG tablet Take 1 tablet (20 mg total) by mouth 2 (two) times daily at 10 am and 4 pm.    Scheduled Meds:  apixaban  5 mg Oral BID   diazepam  5 mg Oral QHS   diltiazem  180 mg Oral BID   docusate sodium  100 mg Oral BID   leptospermum manuka honey  1 Application Topical Daily   metoprolol succinate  100 mg Oral BID   potassium chloride SA  20 mEq Oral BID   sertraline  50 mg Oral QHS   sodium chloride flush  3 mL Intravenous Q12H   torsemide  20 mg Oral BID   Continuous Infusions:  cefTRIAXone (ROCEPHIN)  IV 2  g (10/14/21 1751)   PRN Meds:.acetaminophen **OR** acetaminophen, bisacodyl, haloperidol lactate, hydrALAZINE, morphine injection, ondansetron **OR** ondansetron (ZOFRAN) IV, oxyCODONE, polyethylene glycol   I/O last 3 completed shifts: In: 680 [P.O.:580; IV Piggyback:100] Out: -  No intake/output data recorded.  Net IO Since Admission: 680 mL [10/14/21 2128]   Radiology:   DG Tibia/Fibula Left  Result Date: 10/14/2021 CLINICAL DATA:  Cellulitis left lower leg. EXAM: LEFT TIBIA AND FIBULA - 2 VIEW COMPARISON:  None Available. FINDINGS: Negative for fracture or osteomyelitis.  No skeletal abnormality Soft tissue swelling around the ankle most prominent medially. IMPRESSION: No acute skeletal abnormality Electronically Signed   By: Franchot Gallo M.D.   On: 10/14/2021 12:26   DG Chest 2 View  Result Date: 10/13/2021 CLINICAL DATA:  Weakness, dizziness. EXAM: CHEST - 2 VIEW COMPARISON:  July 18, 2019. FINDINGS: Stable cardiomediastinal silhouette. Both lungs are clear. The visualized skeletal structures are unremarkable. IMPRESSION: No active cardiopulmonary disease. Electronically Signed   By: Marijo Conception M.D.   On: 10/13/2021 12:36    Cardiac Studies:   PCV ECHOCARDIOGRAM COMPLETE 09/02/2021  Narrative Echocardiogram 08/17/2021: 1. Normal LV systolic function with visual EF 60-65%. Left ventricle cavity is normal in size. Hypertrophic cardiomyopathy. Severe concentric hypertrophy of the left ventricle. Normal global wall motion. Ungraded restrictive diastolic dysfunction, elevated LAP. 2. Left atrial cavity is severely dilated at 5.4 cm. 3. Right atrial cavity is moderately dilated. 4. Right ventricle cavity is mild to moderately dilated. Normal right ventricular function. 5. Mild (Grade I) mitral regurgitation. Systolic anterior motion of MV noted. Moderate mitral valve posterior leaflet calcification. E-wave dominant mitral inflow. 6. Structurally normal tricuspid valve. Moderate  tricuspid regurgitation. Moderate pulmonary hypertension. RVSP measures 45 mmHg. 7. IVC is dilated with respiratory variation. 9. Recommend TEE.  Compared to the study done on 07/29/2017 report, RV dilatation is new.  Otherwise no significant change.   PCV MYOCARDIAL PERFUSION WITH LEXISCAN 08/23/2021  Myocardial perfusion is normal. Overall LV systolic function is normal without regional wall motion abnormalities. Stress LV EF: 63%. Abnormal ECG stress, positive for ischemic changes in inferior leads. TID ratio 1.28, which is abnormal therefore cannot r/o balanced ischemia given positive EKG and TID. No previous exam available for comparison. High risk study.  EKG:  10/13/2021: Atrial fibrillation with controlled ventricular response at rate of 56 bpm, incomplete right bundle branch block, nonspecific ST-T abnormality.  LVH. Compared to 09/09/21, HR down from 76/min  Assessment   1.  Acute on chronic diastolic heart failure 2.  Acute renal failure secondary to ATN from low cardiac output. 3.  HOCM 4.  History of pyoderma gangrenosum with worsening bilateral leg edema, open wounds in the left leg. 5.  Reactive depression 6.  Prior history of excess alcohol use 7.  Persistent atrial fibrillation with controlled ventricular response CHA2DS2-VASc Score is 4.  Yearly risk of stroke: 5% (F, A, HTN, CHF).  Score of 1=0.6; 2=2.2; 3=3.2; 4=4.8; 5=7.2; 6=9.8; 7=>9.8) -(CHF; HTN; vasc disease DM,  Female = 1; Age <65 =0; 65-74 = 1,  >75 =2; stroke/embolism= 2).    Recommendations:   Patient is extremely sick, she was unable to walk even a few feet without nearly falling down, seeping wound drain in the left leg, 3-4+ leg edema bilaterally, left worse with open wounds.  Patient is also in acute renal failure from diuresis as well.  Patient's living conditions are extremely poor and she lives in a group home with inability to cook herself better meals, essentially eats out at [employer] and  does not have a healthy living environment.    She is presently on Jardiance and torsemide, will continue the same for today and if serum creatinine continues to rise, will consider doing a right heart catheterization to evaluate further.  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  Continue combination of beta-blocker therapy and calcium blocker therapy to keep her heart rate <60 bpm to reduce LVOT obstruction in view of HOCM.  Would not discharge her until renal function stabilizes as her renal function is completely normal until recently.  She has very high risk of recurrent admission to the hospital as well.  Management of cellulitis and leg infection as per primary team.  She has been extremely frustrated with all her medical conditions and poor living conditions, her interaction with the physicians last night in the ED was noted and I have discussed with her face-to-face and she apologizes.  I discussed with her regarding reactional depression and I have started her on Zoloft 50 mg in the evening.  He  also drinks about a bottle of wine every night.  We will continue to follow. Will add valium 5 mg for the evening for agitation and probable withdrawal starting 10 pm.   With regard to atrial fibrillation, she is rate controlled and is tolerating anticoagulation.  It may also be worthwhile to consider cardioversion.  She may need amiodarone prior to cardioversion.     Adrian Prows, MD, [facility] 10/14/2021, 9:28 PM Office: [phone]

## 2021-10-14 NOTE — Hospital Course (Addendum)
Christine Jacobson is a 66 y.o. female with a history of HOCM, chronic diastolic heart failure, atrial fibrillation on Eliquis, hypertension, hyperlipidemia, pyoderma gangrenosa and hyperprolactinemia. Patient presented from her cardiologist's office secondary to edema and left leg cellulitis. Patient started empirically on Ceftriaxone. Patient transitioned to cefadroxil on discharge and referral to wound clinic.

## 2021-10-14 NOTE — Progress Notes (Addendum)
Order received for strict I&O; urinary collection device now placed in toilet of patient's bathroom. Provided instructions on use and when to notify RN for measurement and disposal.  Patient states that she would like a social work consult anticipating high demand and limited availability of any available Rabbi.  Patient states that she would like to discuss planned heart catheterization with Dr. Einar Gip "in the morning". Dr. Einar Gip offered to speak over the phone; patient deferred citing the time of night. Informed Dr. Einar Gip. Consent not obtained as of this time with only one site of IV access; catheterization preparation otherwise continuing as planned including NPO after midnight.

## 2021-10-15 ENCOUNTER — Encounter (HOSPITAL_COMMUNITY): Payer: Self-pay | Admitting: Cardiology

## 2021-10-15 ENCOUNTER — Encounter (HOSPITAL_COMMUNITY)
Admission: EM | Disposition: A | Discharge status: Home / Self Care | Payer: Self-pay | Source: Home / Self Care | Attending: Emergency Medicine

## 2021-10-15 ENCOUNTER — Ambulatory Visit: Payer: Medicare Other | Admitting: Internal Medicine

## 2021-10-15 DIAGNOSIS — E785 Hyperlipidemia, unspecified: Secondary | ICD-10-CM

## 2021-10-15 DIAGNOSIS — I2729 Other secondary pulmonary hypertension: Secondary | ICD-10-CM

## 2021-10-15 DIAGNOSIS — I272 Pulmonary hypertension, unspecified: Secondary | ICD-10-CM

## 2021-10-15 DIAGNOSIS — I5032 Chronic diastolic (congestive) heart failure: Secondary | ICD-10-CM | POA: Diagnosis not present

## 2021-10-15 DIAGNOSIS — L03116 Cellulitis of left lower limb: Secondary | ICD-10-CM | POA: Diagnosis not present

## 2021-10-15 DIAGNOSIS — I5033 Acute on chronic diastolic (congestive) heart failure: Secondary | ICD-10-CM | POA: Diagnosis not present

## 2021-10-15 HISTORY — PX: RIGHT HEART CATH: CATH118263

## 2021-10-15 LAB — BASIC METABOLIC PANEL
Anion gap: 11 (ref 5–15)
BUN: 25 mg/dL — ABNORMAL HIGH (ref 8–23)
CO2: 29 mmol/L (ref 22–32)
Calcium: 8.9 mg/dL (ref 8.9–10.3)
Chloride: 98 mmol/L (ref 98–111)
Creatinine, Ser: 1.26 mg/dL — ABNORMAL HIGH (ref 0.44–1.00)
GFR, Estimated: 47 mL/min — ABNORMAL LOW (ref >60)
Glucose, Bld: 100 mg/dL — ABNORMAL HIGH (ref 70–99)
Potassium: 3.2 mmol/L — ABNORMAL LOW (ref 3.5–5.1)
Sodium: 138 mmol/L (ref 135–145)

## 2021-10-15 LAB — POCT I-STAT EG7
Acid-Base Excess: 4 mmol/L — ABNORMAL HIGH (ref 0.0–2.0)
Acid-Base Excess: 5 mmol/L — ABNORMAL HIGH (ref 0.0–2.0)
Bicarbonate: 28.2 mmol/L — ABNORMAL HIGH (ref 20.0–28.0)
Bicarbonate: 29.6 mmol/L — ABNORMAL HIGH (ref 20.0–28.0)
Calcium, Ion: 1.16 mmol/L (ref 1.15–1.40)
Calcium, Ion: 1.22 mmol/L (ref 1.15–1.40)
HCT: 38 % (ref 36.0–46.0)
HCT: 39 % (ref 36.0–46.0)
Hemoglobin: 12.9 g/dL (ref 12.0–15.0)
Hemoglobin: 13.3 g/dL (ref 12.0–15.0)
O2 Saturation: 65 %
O2 Saturation: 67 %
Potassium: 3.3 mmol/L — ABNORMAL LOW (ref 3.5–5.1)
Potassium: 3.5 mmol/L (ref 3.5–5.1)
Sodium: 138 mmol/L (ref 135–145)
Sodium: 140 mmol/L (ref 135–145)
TCO2: 29 mmol/L (ref 22–32)
TCO2: 31 mmol/L (ref 22–32)
pCO2, Ven: 41.9 mmHg — ABNORMAL LOW (ref 44–60)
pCO2, Ven: 43.3 mmHg — ABNORMAL LOW (ref 44–60)
pH, Ven: 7.437 — ABNORMAL HIGH (ref 7.25–7.43)
pH, Ven: 7.443 — ABNORMAL HIGH (ref 7.25–7.43)
pO2, Ven: 33 mmHg (ref 32–45)
pO2, Ven: 34 mmHg (ref 32–45)

## 2021-10-15 LAB — BRAIN NATRIURETIC PEPTIDE: B Natriuretic Peptide: 531.2 pg/mL — ABNORMAL HIGH (ref 0.0–100.0)

## 2021-10-15 LAB — MAGNESIUM: Magnesium: 2 mg/dL (ref 1.7–2.4)

## 2021-10-15 SURGERY — RIGHT HEART CATH

## 2021-10-15 MED ORDER — SODIUM CHLORIDE 0.9% FLUSH: 3.0000 mL | INTRAVENOUS | Status: DC | PRN

## 2021-10-15 MED ORDER — SODIUM CHLORIDE 0.9 % IV SOLN: INTRAVENOUS | Status: DC

## 2021-10-15 MED ORDER — HEPARIN (PORCINE) IN NACL 1000-0.9 UT/500ML-% IV SOLN
INTRAVENOUS | Status: DC | PRN
  Administered 2021-10-15: 500 mL

## 2021-10-15 MED ORDER — LIDOCAINE HCL (PF) 1 % IJ SOLN
INTRAMUSCULAR | Status: DC | PRN
  Administered 2021-10-15: 2 mL

## 2021-10-15 MED ORDER — SODIUM CHLORIDE 0.9 % IV SOLN: 250.0000 mL | INTRAVENOUS | Status: DC | PRN

## 2021-10-15 MED ORDER — CEFADROXIL 500 MG PO CAPS: 500.0000 mg | ORAL_CAPSULE | Freq: Two times a day (BID) | ORAL | 0 refills | Status: AC

## 2021-10-15 MED ORDER — FUROSEMIDE 10 MG/ML IJ SOLN
40.0000 mg | Freq: Once | INTRAMUSCULAR | Status: AC
  Administered 2021-10-15: 40 mg via INTRAVENOUS
  Filled 2021-10-15: qty 4

## 2021-10-15 MED ORDER — POTASSIUM CHLORIDE CRYS ER 20 MEQ PO TBCR
40.0000 meq | EXTENDED_RELEASE_TABLET | Freq: Once | ORAL | Status: AC
  Administered 2021-10-15: 40 meq via ORAL
  Filled 2021-10-15: qty 2

## 2021-10-15 MED ORDER — HEPARIN (PORCINE) IN NACL 1000-0.9 UT/500ML-% IV SOLN
INTRAVENOUS | Status: AC
  Filled 2021-10-15: qty 500

## 2021-10-15 MED ORDER — LIDOCAINE HCL (PF) 1 % IJ SOLN
INTRAMUSCULAR | Status: AC
  Filled 2021-10-15: qty 30

## 2021-10-15 MED ORDER — POTASSIUM CHLORIDE CRYS ER 20 MEQ PO TBCR
40.0000 meq | EXTENDED_RELEASE_TABLET | ORAL | Status: AC
  Administered 2021-10-15 (×2): 40 meq via ORAL
  Filled 2021-10-15 (×2): qty 2

## 2021-10-15 SURGICAL SUPPLY — 8 items
CATH BALLN WEDGE 5F 110CM (CATHETERS) IMPLANT
PACK CARDIAC CATHETERIZATION (CUSTOM PROCEDURE TRAY) IMPLANT
PROTECTION STATION PRESSURIZED (MISCELLANEOUS) ×1
SHEATH GLIDE SLENDER 4/5FR (SHEATH) IMPLANT
STATION PROTECTION PRESSURIZED (MISCELLANEOUS) IMPLANT
TRANSDUCER W/STOPCOCK (MISCELLANEOUS) IMPLANT
TUBING ART PRESS 72  MALE/FEM (TUBING) ×1
TUBING ART PRESS 72 MALE/FEM (TUBING) IMPLANT

## 2021-10-15 NOTE — Interval H&P Note (Signed)
History and Physical Interval Note:  10/15/2021 11:05 AM  Christine Jacobson  has presented today for surgery, with the diagnosis of chf.  The various methods of treatment have been discussed with the patient and family. After consideration of risks, benefits and other options for treatment, the patient has consented to  Procedure(s): RIGHT HEART CATH (N/A) as a surgical intervention.  The patient's history has been reviewed, patient examined, no change in status, stable for surgery.  I have reviewed the patient's chart and labs.  Questions were answered to the patient's satisfaction.    2012 Appropriate Use Criteria for Diagnostic Catheterization Pulmonary Hypertension (Right Heart Catheterization) Indication:  Suspected pulmonary hypertension Elevated estimated right ventricular systolic pressure on resting echo study A (7) Indication: 98; Score 7 295  Zaydn Gutridge J Alisha Bacus

## 2021-10-15 NOTE — Discharge Summary (Signed)
Physician Discharge Summary   Patient: Christine Jacobson MRN: [id number] DOB: [birth year]  Admit date:     10/13/2021  Discharge date: 10/15/21  Discharge Physician: Cordelia Poche, MD   PCP: Patient, No Pcp Per   Recommendations at discharge:  Outpatient wound care PCP follow-up  Discharge Diagnoses: Principal Problem:   Cellulitis and abscess of left lower extremity Active Problems:   Mild hyperlipidemia   Chronic diastolic heart failure (HCC)   Primary hypertension   Persistent atrial fibrillation (HCC)  Resolved Problems:   Acute on chronic diastolic (congestive) heart failure [facility])  Hospital Course: [name] is a 66 y.o. female with a history of HOCM, chronic diastolic heart failure, atrial fibrillation on Eliquis, hypertension, hyperlipidemia, pyoderma gangrenosa and hyperprolactinemia. Patient presented from her cardiologist's office secondary to edema and left leg cellulitis. Patient started empirically on Ceftriaxone. Patient transitioned to cefadroxil on discharge and referral to wound clinic.  Assessment and Plan:  Left lower extremity cellulitis Secondary to chronic LE dermatitis/wounds. Afebrile. No cultures obtained. Started empirically on Ceftriaxone and transitioned to Cefadroxil on discharge to complete 7 days of treatment. Lower extremity x-ray and CT scan without evidence of abscess. Referral to outpatient wound care.   Chronic diastolic heart failure HOCM No overt evidence of acute heart failure. Right heart catheterization performed on 9/29 and is significant for moderate pulmonary hypertension.   Atrial fibrillation Continue diltiazem and Eliquis   AKI Baseline creatinine is difficult to tease. Creatinine of 1.13 from March 2023 but more recent creatinine of 1.51 from 9/23. Creatinine of 1.78 on admission and improved to 1.26 on day of discharge after Lasix 40 mg IV x1.  Hypokalemia Secondary to diuresis. Potassium of 3.2. Increased potassium  supplementation given. Resume home potassium on discharge.   Primary hypertension Continue Toprol XL  Mood disorder Psychiatry consulted but patient declined medication therapy.   Obesity Body mass index is 35.75 kg/m.   Consultants: Psychiatry, Cardiology Procedures performed: Right heart catheterization  Disposition: Home Diet recommendation: Cardiac diet  DISCHARGE MEDICATION: Allergies as of 10/15/2021   No Known Allergies      Medication List     STOP taking these medications    amoxicillin-clavulanate 500-125 MG tablet Commonly known as: AUGMENTIN   Ibuprofen 200 MG Caps       TAKE these medications    acetaminophen 325 MG tablet Commonly known as: TYLENOL Take 650 mg by mouth every 6 (six) hours as needed for mild pain or moderate pain.   apixaban 5 MG Tabs tablet Commonly known as: ELIQUIS Take 1 tablet (5 mg total) by mouth 2 (two) times daily.   cefadroxil 500 MG capsule Commonly known as: DURICEF Take 1 capsule (500 mg total) by mouth 2 (two) times daily for 5 days.   diltiazem 180 MG 24 hr capsule Commonly known as: CARDIZEM CD Take 1 capsule (180 mg total) by mouth in the morning and at bedtime.   empagliflozin 10 MG Tabs tablet Commonly known as: Jardiance Take 1 tablet (10 mg total) by mouth daily before breakfast.   metoprolol succinate 100 MG 24 hr tablet Commonly known as: TOPROL-XL TAKE ONE TABLET BY MOUTH TWICE A DAY WITH OR IMMEDIATELY FOLLOWING A MEAL What changed: See the new instructions.   potassium chloride SA 20 MEQ tablet Commonly known as: KLOR-CON M Take 1 tablet (20 mEq total) by mouth 2 (two) times daily.   torsemide 20 MG tablet Commonly known as: DEMADEX Take 1 tablet (20 mg total) by mouth 2 (two) times  daily at 10 am and 4 pm.               Discharge Care Instructions  (From admission, onward)           Start     Ordered   10/15/21 0000  Discharge wound care:       Comments: Apply Medihoney to  left posterior leg Q day, and Xeroform gauze to any other open weeping areas on bilat legs Q day, then cover with ABD pads and kerlex, beginning just behind toes, to below knees, then ace wrap in the same manner.   10/15/21 1231            Discharge Exam: BP 114/65   Pulse (!) 0   Temp 97.9 F (36.6 C) (Oral)   Resp 17   Ht 5\' 4"  (1.626 m)   Wt 94.5 kg   SpO2 100%   BMI 35.75 kg/m   General exam: Appears calm and comfortable Respiratory system: Clear to auscultation. Respiratory effort normal. Cardiovascular system: S1 & S2 heard, RRR. Gastrointestinal system: Abdomen is nondistended, soft and nontender.  Central nervous system: Alert and oriented. No focal neurological deficits. Musculoskeletal: No edema. No calf tenderness Skin: improved LLE erythema. Wounds without purulent drainage Psychiatry: Judgement and insight appear normal. Mood & affect appropriate.   Condition at discharge: stable  The results of significant diagnostics from this hospitalization (including imaging, microbiology, ancillary and laboratory) are listed below for reference.   Imaging Studies: CT TIBIA FIBULA LEFT WO CONTRAST  Result Date: 10/14/2021 CLINICAL DATA:  Soft tissue infection suspected. EXAM: CT OF THE LOWER LEFT EXTREMITY WITHOUT CONTRAST TECHNIQUE: Multidetector CT imaging of the lower left extremity was performed according to the standard protocol. RADIATION DOSE REDUCTION: This exam was performed according to the departmental dose-optimization program which includes automated exposure control, adjustment of the mA and/or kV according to patient size and/or use of iterative reconstruction technique. COMPARISON:  None Available. FINDINGS: Bones/Joint/Cartilage No cortical erosion or periosteal reaction. Subchondral cystic changes of the patellofemoral compartment. Small joint effusion. No acute fracture or dislocation. Baker's cyst measuring approximately 2.1 x 2.4 x 4.5 cm. Ligaments  Suboptimally assessed by CT. Muscles and Tendons Muscles are normal in bulk. No intramuscular hematoma or fluid collection. Soft tissues Marked skin thickening and subcutaneous soft tissue edema of the leg prominent about the lateral aspect of the leg and about the ankle/foot. No fluid collection or hematoma. No evidence of subcutaneous gas. IMPRESSION: 1. Skin thickening and marked subcutaneous soft tissue edema of the leg and ankle without evidence of fluid collection, abscess. 2. Muscles are normal in bulk. No intramuscular hematoma or fluid collection. 3. Moderate patellofemoral knee osteoarthritis with joint effusion. Baker's cyst measuring approximately 2.1 x 2.4 x 4.5 cm. No acute osseous abnormality. Electronically Signed   By: Keane Police D.O.   On: 10/14/2021 22:20   DG Tibia/Fibula Left  Result Date: 10/14/2021 CLINICAL DATA:  Cellulitis left lower leg. EXAM: LEFT TIBIA AND FIBULA - 2 VIEW COMPARISON:  None Available. FINDINGS: Negative for fracture or osteomyelitis.  No skeletal abnormality Soft tissue swelling around the ankle most prominent medially. IMPRESSION: No acute skeletal abnormality Electronically Signed   By: Franchot Gallo M.D.   On: 10/14/2021 12:26   DG Chest 2 View  Result Date: 10/13/2021 CLINICAL DATA:  Weakness, dizziness. EXAM: CHEST - 2 VIEW COMPARISON:  July 18, 2019. FINDINGS: Stable cardiomediastinal silhouette. Both lungs are clear. The visualized skeletal structures are unremarkable. IMPRESSION: No  active cardiopulmonary disease. Electronically Signed   By: Marijo Conception M.D.   On: 10/13/2021 12:36   VAS [place] LOWER EXTREMITY VENOUS (DVT) (ONLY [facility] & [facility])  Result Date: 10/10/2021  Lower Venous DVT Study Patient Name:  SHANIJA PERUSSE  Date of Exam:   10/09/2021 Medical Rec #: [id number]      Accession #:    [id number] Date of Birth: Nov 10, 1955      Patient Gender: F Patient Age:   73 years Exam Location:  [facility] Procedure:      VAS [place] LOWER EXTREMITY VENOUS  (DVT) Referring Phys: Herbie Baltimore PATERSON --------------------------------------------------------------------------------  Indications: Swelling.  Risk Factors: None identified. Limitations: Body habitus and poor ultrasound/tissue interface. Comparison Study: No prior studies. Performing Technologist: Oliver Hum RVT  Examination Guidelines: A complete evaluation includes B-mode imaging, spectral Doppler, color Doppler, and power Doppler as needed of all accessible portions of each vessel. Bilateral testing is considered an integral part of a complete examination. Limited examinations for reoccurring indications may be performed as noted. The reflux portion of the exam is performed with the patient in reverse Trendelenburg.  +-----+---------------+---------+-----------+----------+--------------+ RIGHTCompressibilityPhasicitySpontaneityPropertiesThrombus Aging +-----+---------------+---------+-----------+----------+--------------+ CFV  Full           Yes      Yes                                 +-----+---------------+---------+-----------+----------+--------------+   +---------+---------------+---------+-----------+----------+--------------+ LEFT     CompressibilityPhasicitySpontaneityPropertiesThrombus Aging +---------+---------------+---------+-----------+----------+--------------+ CFV      Full           Yes      Yes                                 +---------+---------------+---------+-----------+----------+--------------+ SFJ      Full                                                        +---------+---------------+---------+-----------+----------+--------------+ FV Prox  Full                                                        +---------+---------------+---------+-----------+----------+--------------+ FV Mid   Full                                                        +---------+---------------+---------+-----------+----------+--------------+ FV DistalFull                                                         +---------+---------------+---------+-----------+----------+--------------+ PFV      Full                                                        +---------+---------------+---------+-----------+----------+--------------+  POP      Full           Yes      Yes                                 +---------+---------------+---------+-----------+----------+--------------+ PTV      Full                                                        +---------+---------------+---------+-----------+----------+--------------+ PERO     Full                                                        +---------+---------------+---------+-----------+----------+--------------+    Summary: RIGHT: - No evidence of common femoral vein obstruction.  LEFT: - There is no evidence of deep vein thrombosis in the lower extremity. However, portions of this examination were limited- see technologist comments above.  - No cystic structure found in the popliteal fossa.  *See table(s) above for measurements and observations. Electronically signed by Jamelle Haring on 10/10/2021 at 9:51:13 AM.    Final     Microbiology: Results for orders placed or performed during the hospital encounter of 10/09/21  Culture, blood (routine x 2)     Status: None   Collection Time: 10/09/21 10:28 AM   Specimen: BLOOD  Result Value Ref Range Status   Specimen Description   Final    BLOOD RIGHT ANTECUBITAL Performed at [facility], [place] [address] [place], [place] [address]    Special Requests   Final    BOTTLES DRAWN AEROBIC AND ANAEROBIC Blood Culture adequate volume Performed at [place] [address], [place], [place] [address]    Culture   Final    NO GROWTH 5 DAYS Performed at [facility], [place] [address], [place],  [address]    Report Status 10/14/2021 FINAL  Final    Labs: CBC: Recent Labs  Lab  10/09/21 1028 10/13/21 1206 10/14/21 0042  WBC 8.8 8.4 9.0  NEUTROABS 7.0 6.5  --   HGB 14.2 14.5 13.1  HCT 42.7 42.7 39.1  MCV 95.7 94.9 94.4  PLT 216 223 [id number]   Basic Metabolic Panel: Recent Labs  Lab 10/09/21 1028 10/13/21 1206 10/14/21 0042 10/15/21 0039  NA 138 136 137 138  K 3.0* 3.8 3.7 3.2*  CL 100 102 101 98  CO2 27 26 24 29   GLUCOSE 137* 113* 129* 100*  BUN 46* 48* 42* 25*  CREATININE 1.51* 1.78* 1.80* 1.26*  CALCIUM 10.3 9.8 9.6 8.9  MG  --   --   --  2.0    CBG: Recent Labs  Lab 10/13/21 2155  GLUCAP 145*    Discharge time spent: 35 minutes.  Signed: Cordelia Poche, MD [facility] 10/15/2021

## 2021-10-15 NOTE — Discharge Instructions (Signed)
Christine Jacobson,  You were in the hospital with a skin infection. This has improved. Please follow-up with wound care as Dr. Einar Gip has arranged for you. Please continue antibiotics.

## 2021-10-15 NOTE — H&P (View-Only) (Signed)
Subjective:  Feels better.  Slept well.  Intake/Output from previous day:  I/O last 3 completed shifts: In: 680 [P.O.:580; IV Piggyback:100] Out: -  No intake/output data recorded. Net IO Since Admission: 680 mL [10/15/21 0850]  Blood pressure 125/64, pulse 72, temperature 97.8 F (36.6 C), temperature source Oral, resp. rate 17, height 5' 4" (1.626 m), weight 94.5 kg, SpO2 96 %. Physical Exam  Lab Results: Lab Results  Component Value Date   NA 138 10/15/2021   K 3.2 (L) 10/15/2021   CO2 29 10/15/2021   GLUCOSE 100 (H) 10/15/2021   BUN 25 (H) 10/15/2021   CREATININE 1.26 (H) 10/15/2021   CALCIUM 8.9 10/15/2021   EGFR 53 (L) 03/29/2021   GFRNONAA 47 (L) 10/15/2021    BNP (last 3 results) Recent Labs    10/13/21 1206 10/14/21 0042 10/15/21 0039  BNP 675.6* 639.2* 531.2*    ProBNP (last 3 results) No results for input(s): "PROBNP" in the last 8760 hours.    Latest Ref Rng & Units 10/15/2021   12:39 AM 10/14/2021   12:42 AM 10/13/2021   12:06 PM  BMP  Glucose 70 - 99 mg/dL 100  129  113   BUN 8 - 23 mg/dL 25  42  48   Creatinine 0.44 - 1.00 mg/dL 1.26  1.80  1.78   Sodium 135 - 145 mmol/L 138  137  136   Potassium 3.5 - 5.1 mmol/L 3.2  3.7  3.8   Chloride 98 - 111 mmol/L 98  101  102   CO2 22 - 32 mmol/L 29  24  26   Calcium 8.9 - 10.3 mg/dL 8.9  9.6  9.8       Latest Ref Rng & Units 05/26/2011    9:28 AM 09/23/2010   11:55 AM  Hepatic Function  Total Protein 6.0 - 8.3 g/dL 7.1  7.3   Albumin 3.5 - 5.2 g/dL 3.8  4.1   AST 0 - 37 U/L 83  29   ALT 0 - 35 U/L 128  49   Alk Phosphatase 39 - 117 U/L 111  74   Total Bilirubin 0.3 - 1.2 mg/dL 0.7  0.6   Bilirubin, Direct 0.0 - 0.3 mg/dL 0.1        Latest Ref Rng & Units 10/14/2021   12:42 AM 10/13/2021   12:06 PM 10/09/2021   10:28 AM  CBC  WBC 4.0 - 10.5 K/uL 9.0  8.4  8.8   Hemoglobin 12.0 - 15.0 g/dL 13.1  14.5  14.2   Hematocrit 36.0 - 46.0 % 39.1  42.7  42.7   Platelets 150 - 400 K/uL 208  223  216     Lipid Panel     Component Value Date/Time   CHOL 232 (H) 10/14/2021 0042   TRIG 191 (H) 10/14/2021 0042   HDL 52 10/14/2021 0042   CHOLHDL 4.5 10/14/2021 0042   VLDL 38 10/14/2021 0042   LDLCALC 142 (H) 10/14/2021 0042   LDLDIRECT 121.8 05/26/2011 0928   Cardiac Panel (last 3 results) No results for input(s): "CKTOTAL", "CKMB", "TROPONINI", "RELINDX" in the last 72 hours.  HEMOGLOBIN A1C No results found for: "HGBA1C", "MPG" TSH No results for input(s): "TSH" in the last 8760 hours. Imaging:  CXR 10/13/2021: Stable cardiomediastinal silhouette. Both lungs are clear. The visualized skeletal structures are unremarkable.  Cardiac Studies:  EKG:   EKG 09/09/2021: Atrial fibrillation with controlled ventricular response at rate of 76 bpm, incomplete right bundle branch block,   nonspecific ST-T abnormality.  LVH.  No significant change from 07/28/2021  PCV ECHOCARDIOGRAM COMPLETE 09/02/2021   Narrative Echocardiogram 08/17/2021: 1. Normal LV systolic function with visual EF 60-65%. Left ventricle cavity is normal in size. Hypertrophic cardiomyopathy. Severe concentric hypertrophy of the left ventricle. Normal global wall motion. Ungraded restrictive diastolic dysfunction, elevated LAP. 2. Left atrial cavity is severely dilated at 5.4 cm. 3. Right atrial cavity is moderately dilated. 4. Right ventricle cavity is mild to moderately dilated. Normal right ventricular function. 5. Mild (Grade I) mitral regurgitation. Systolic anterior motion of MV noted. Moderate mitral valve posterior leaflet calcification. E-wave dominant mitral inflow. 6. Structurally normal tricuspid valve. Moderate tricuspid regurgitation. Moderate pulmonary hypertension. RVSP measures 45 mmHg. 7. IVC is dilated with respiratory variation. 9. Recommend TEE.  Compared to the study done on 07/29/2017 report, RV dilatation is new.  Otherwise no significant change.   PCV MYOCARDIAL PERFUSION WITH LEXISCAN 08/23/2021   Myocardial perfusion is normal. Overall LV systolic function is normal without regional wall motion abnormalities. Stress LV EF: 63%. Abnormal ECG stress, positive for ischemic changes in inferior leads. TID ratio 1.28, which is abnormal therefore cannot r/o balanced ischemia given positive EKG and TID. No previous exam available for comparison. High risk study.   Scheduled Meds:  apixaban  5 mg Oral BID   diazepam  5 mg Oral QHS   diltiazem  180 mg Oral BID   docusate sodium  100 mg Oral BID   furosemide  40 mg Intravenous Once   leptospermum manuka honey  1 Application Topical Daily   metoprolol succinate  100 mg Oral BID   potassium chloride  40 mEq Oral Once   potassium chloride  40 mEq Oral Q4H   sertraline  50 mg Oral QHS   sodium chloride flush  3 mL Intravenous Q12H   sodium chloride flush  3 mL Intravenous Q12H   torsemide  20 mg Oral BID   Continuous Infusions:  cefTRIAXone (ROCEPHIN)  IV 2 g (10/14/21 1751)   PRN Meds:.acetaminophen **OR** acetaminophen, bisacodyl, haloperidol lactate, hydrALAZINE, morphine injection, ondansetron **OR** ondansetron (ZOFRAN) IV, oxyCODONE, polyethylene glycol  Assessment  1.  Acute on chronic diastolic heart failure 2.  HOCM 3.  Obesity hypoventilation and chronic dyspnea on exertion 4.  Strongly suspect obstructive sleep apnea 5.  Chronic renal insufficiency stage IIIa, serum creatinine has improved. 6.  Pyoderma gangrenosum left leg, history of right leg grafting for similar situation, suspect she will need dermatology follow-up.  7.  Excess alcohol intake, patient has been drinking about a bottle of wine every night. 8.  Depression   Plan:   Christine Jacobson  is a 66 y.o. Patient with HOCM intracavitary pressure gradient of 64 mmHg increasing millimeter mercury with Valsalva maneuver, chronic diastolic heart failure grade 2 diastolic dysfunction, paroxysmal atrial fibrillation, primary hypertension, hypercholesterolemia, moderate  obesity,   It was felt to be wild strain cardiomyopathy in the absence of family history.  Patient is single and does not have any children or close relatives.  She has had a nonischemic nuclear stress test in August 2023 however TID was high and presumed to be due to subendocardial ischemia from HOCM.  Now admitted with worsening dyspnea, worsening leg edema and open wounds involving the left leg.  Her left leg ulcerations appear to be more consistent with pyoderma gangrenosum and not necessarily related to edema.  She has had right leg skin transplantation in the past for treatment of the same.  Suspect she will   need the same to be done on the left side for complete healing.  We will request primary team if they would have any recommendations regarding this.  Also she would like to know if she would have to apply any kind of cream or ointments for her left leg along with routine dressing.  With regard to acute on chronic diastolic heart failure, although strict I's and O's were not maintained, leg edema appears much improved and essentially now has nonpitting edema that is chronic.  There is no JVD.  She still has faint bibasilar crackles.  I will give her 1 dose of IV Lasix to see if she has performed diuresis.  I will schedule her for right  heart catheterization to evaluate cardiac output and cardiac index along with evaluation of pulmonary pressures.  Would like to know if her pulmonary hypertension is related to [facility] group 2 or 1.  Otherwise from cardiac standpoint, we will continue present medications and would reduce the dose of torsemide to 20 mg once a day and I will start her on amiodarone and possibly consider cardioversion to see if I can maintain sinus rhythm to improve diastolic compliance depending upon her right heart catheterization.  Otherwise she is ready for discharge after right heart catheterization.  Patient is willing to proceed.  I have discussed with her regarding complete  abstinence from alcohol.  She probably has severe sleep apnea and alcohol use probably contributing to worsening of her symptoms.  I have started her on Zoloft 50 mg in the evening, she will continue the same.    Adrian Prows, MD, [facility] 10/15/2021, 8:50 AM Office: [phone] Fax: [phone] Pager: [phone]

## 2021-10-15 NOTE — Evaluation (Signed)
Physical Therapy Evaluation Patient Details Name: Christine Jacobson MRN: [id number] DOB: July 30, 1955 Today's Date: 10/15/2021  History of Present Illness  66 y.o. female with medical history significant of HOCM; chronic diastolic CHF; afib on Eliquis; HTN; HLD; pyoderma gangrenosa; and hyperprolactinemia presenting with worsening edema, LE wounds.  Clinical Impression  Pt doing well with mobility and no further PT needed.  Ready for dc from PT standpoint.        Recommendations for follow up therapy are one component of a multi-disciplinary discharge planning process, led by the attending physician.  Recommendations may be updated based on patient status, additional functional criteria and insurance authorization.  Follow Up Recommendations No PT follow up      Assistance Recommended at Discharge None  Patient can return home with the following       Equipment Recommendations None recommended by PT  Recommendations for Other Services       Functional Status Assessment Patient has not had a recent decline in their functional status     Precautions / Restrictions Precautions Precautions: None Restrictions Weight Bearing Restrictions: No      Mobility  Bed Mobility Overal bed mobility: Independent                  Transfers Overall transfer level: Independent                      Ambulation/Gait Ambulation/Gait assistance: Modified independent (Device/Increase time) Gait Distance (Feet): 15 Feet Assistive device: None Gait Pattern/deviations: Step-through pattern, Decreased stride length Gait velocity: decr Gait velocity interpretation: 1.31 - 2.62 ft/sec, indicative of limited community ambulator   General Gait Details: Steady gait with slow pace  Stairs Stairs: Yes Stairs assistance: Supervision Stair Management: One rail Left, One rail Right, Step to pattern, Forwards Number of Stairs: 3 General stair comments: Used portable step and sink counter  as simulated rail. Performed step x 3.  Wheelchair Mobility    Modified Rankin (Stroke Patients Only)       Balance Overall balance assessment: Mild deficits observed, not formally tested                                           Pertinent Vitals/Pain      Home Living Family/patient expects to be discharged to:: Other (Comment) (Patient has her own room and bathroom with communal kitchen/living room.  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     Hand Dominance   Dominant Hand: Right    Extremity/Trunk Assessment   Upper Extremity Assessment Upper Extremity Assessment: Defer to OT evaluation    Lower Extremity Assessment Lower Extremity Assessment: Overall WFL for tasks assessed    Cervical / Trunk Assessment Cervical / Trunk Assessment: Kyphotic  Communication   Communication: No difficulties  Cognition Arousal/Alertness: Awake/alert Behavior During Therapy: WFL for tasks assessed/performed Overall Cognitive Status: Within Functional Limits for tasks assessed  General Comments      Exercises     Assessment/Plan    PT Assessment Patient does not need any further PT services  PT Problem List         PT Treatment Interventions      PT Goals (Current goals can be found in the Care Plan section)  Acute Rehab PT Goals PT Goal Formulation: All assessment and education complete, DC  therapy    Frequency       Co-evaluation               AM-PAC PT "6 Clicks" Mobility  Outcome Measure Help needed turning from your back to your side while in a flat bed without using bedrails?: None Help needed moving from lying on your back to sitting on the side of a flat bed without using bedrails?: None Help needed moving to and from a bed to a chair (including a wheelchair)?: None Help needed standing up from a chair using your arms (e.g., wheelchair or bedside chair)?: None Help needed to walk in hospital room?: None Help needed climbing 3-5 steps with a railing? : None 6 Click Score: 24    End of Session   Activity Tolerance: Patient tolerated treatment well Patient left: in bed;with call bell/phone within reach (sitting EOB) Nurse Communication: Mobility status      Time: 1340-1350 PT Time Calculation (min) (ACUTE ONLY): 10 min   Charges:   PT Evaluation $PT Eval Low Complexity: 1 Low          [place] [facility] [place] 10/15/2021, 2:06 PM

## 2021-10-15 NOTE — Progress Notes (Signed)
Subjective:  Feels better.  Slept well.  Intake/Output from previous day:  I/O last 3 completed shifts: In: 680 [P.O.:580; IV Piggyback:100] Out: -  No intake/output data recorded. Net IO Since Admission: 680 mL [10/15/21 0850]  Blood pressure 125/64, pulse 72, temperature 97.8 F (36.6 C), temperature source Oral, resp. rate 17, height 5' 4" (1.626 m), weight 94.5 kg, SpO2 96 %. Physical Exam  Lab Results: Lab Results  Component Value Date   NA 138 10/15/2021   K 3.2 (L) 10/15/2021   CO2 29 10/15/2021   GLUCOSE 100 (H) 10/15/2021   BUN 25 (H) 10/15/2021   CREATININE 1.26 (H) 10/15/2021   CALCIUM 8.9 10/15/2021   EGFR 53 (L) 03/29/2021   GFRNONAA 47 (L) 10/15/2021    BNP (last 3 results) Recent Labs    10/13/21 1206 10/14/21 0042 10/15/21 0039  BNP 675.6* 639.2* 531.2*    ProBNP (last 3 results) No results for input(s): "PROBNP" in the last 8760 hours.    Latest Ref Rng & Units 10/15/2021   12:39 AM 10/14/2021   12:42 AM 10/13/2021   12:06 PM  BMP  Glucose 70 - 99 mg/dL 100  129  113   BUN 8 - 23 mg/dL 25  42  48   Creatinine 0.44 - 1.00 mg/dL 1.26  1.80  1.78   Sodium 135 - 145 mmol/L 138  137  136   Potassium 3.5 - 5.1 mmol/L 3.2  3.7  3.8   Chloride 98 - 111 mmol/L 98  101  102   CO2 22 - 32 mmol/L _0 Calcium 8.9 - 10.3 mg/dL 8.9  9.6  9.8       Latest Ref Rng & Units 05/26/2011    9:28 AM 09/23/2010   11:55 AM  Hepatic Function  Total Protein 6.0 - 8.3 g/dL 7.1  7.3   Albumin 3.5 - 5.2 g/dL 3.8  4.1   AST 0 - 37 U/L 83  29   ALT 0 - 35 U/L 128  49   Alk Phosphatase 39 - 117 U/L 111  74   Total Bilirubin 0.3 - 1.2 mg/dL 0.7  0.6   Bilirubin, Direct 0.0 - 0.3 mg/dL 0.1        Latest Ref Rng & Units 10/14/2021   12:42 AM 10/13/2021   12:06 PM 10/09/2021   10:28 AM  CBC  WBC 4.0 - 10.5 K/uL 9.0  8.4  8.8   Hemoglobin 12.0 - 15.0 g/dL 13.1  14.5  14.2   Hematocrit 36.0 - 46.0 % 39.1  42.7  42.7   Platelets 150 - 400 K/uL 208  223  216     Lipid Panel     Component Value Date/Time   CHOL 232 (H) 10/14/2021 0042   TRIG 191 (H) 10/14/2021 0042   HDL 52 10/14/2021 0042   CHOLHDL 4.5 10/14/2021 0042   VLDL 38 10/14/2021 0042   LDLCALC 142 (H) 10/14/2021 0042   LDLDIRECT 121.8 05/26/2011 0928   Cardiac Panel (last 3 results) No results for input(s): "CKTOTAL", "CKMB", "TROPONINI", "RELINDX" in the last 72 hours.  HEMOGLOBIN A1C No results found for: "HGBA1C", "MPG" TSH No results for input(s): "TSH" in the last 8760 hours. Imaging:  CXR 10/13/2021: Stable cardiomediastinal silhouette. Both lungs are clear. The visualized skeletal structures are unremarkable.  Cardiac Studies:  EKG:   EKG 09/09/2021: Atrial fibrillation with controlled ventricular response at rate of 76 bpm, incomplete right bundle branch block,  nonspecific ST-T abnormality.  LVH.  No significant change from 07/28/2021  PCV ECHOCARDIOGRAM COMPLETE 09/02/2021   Narrative Echocardiogram 08/17/2021: 1. Normal LV systolic function with visual EF 60-65%. Left ventricle cavity is normal in size. Hypertrophic cardiomyopathy. Severe concentric hypertrophy of the left ventricle. Normal global wall motion. Ungraded restrictive diastolic dysfunction, elevated LAP. 2. Left atrial cavity is severely dilated at 5.4 cm. 3. Right atrial cavity is moderately dilated. 4. Right ventricle cavity is mild to moderately dilated. Normal right ventricular function. 5. Mild (Grade I) mitral regurgitation. Systolic anterior motion of MV noted. Moderate mitral valve posterior leaflet calcification. E-wave dominant mitral inflow. 6. Structurally normal tricuspid valve. Moderate tricuspid regurgitation. Moderate pulmonary hypertension. RVSP measures 45 mmHg. 7. IVC is dilated with respiratory variation. 9. Recommend TEE.  Compared to the study done on 07/29/2017 report, RV dilatation is new.  Otherwise no significant change.   PCV MYOCARDIAL PERFUSION WITH LEXISCAN 08/23/2021   Myocardial perfusion is normal. Overall LV systolic function is normal without regional wall motion abnormalities. Stress LV EF: 63%. Abnormal ECG stress, positive for ischemic changes in inferior leads. TID ratio 1.28, which is abnormal therefore cannot r/o balanced ischemia given positive EKG and TID. No previous exam available for comparison. High risk study.   Scheduled Meds:  apixaban  5 mg Oral BID   diazepam  5 mg Oral QHS   diltiazem  180 mg Oral BID   docusate sodium  100 mg Oral BID   furosemide  40 mg Intravenous Once   leptospermum manuka honey  1 Application Topical Daily   metoprolol succinate  100 mg Oral BID   potassium chloride  40 mEq Oral Once   potassium chloride  40 mEq Oral Q4H   sertraline  50 mg Oral QHS   sodium chloride flush  3 mL Intravenous Q12H   sodium chloride flush  3 mL Intravenous Q12H   torsemide  20 mg Oral BID   Continuous Infusions:  cefTRIAXone (ROCEPHIN)  IV 2 g (10/14/21 1751)   PRN Meds:.acetaminophen **OR** acetaminophen, bisacodyl, haloperidol lactate, hydrALAZINE, morphine injection, ondansetron **OR** ondansetron (ZOFRAN) IV, oxyCODONE, polyethylene glycol  Assessment  1.  Acute on chronic diastolic heart failure 2.  HOCM 3.  Obesity hypoventilation and chronic dyspnea on exertion 4.  Strongly suspect obstructive sleep apnea 5.  Chronic renal insufficiency stage IIIa, serum creatinine has improved. 6.  Pyoderma gangrenosum left leg, history of right leg grafting for similar situation, suspect she will need dermatology follow-up.  7.  Excess alcohol intake, patient has been drinking about a bottle of wine every night. 8.  Depression   Plan:   Christine Jacobson  is a 66 y.o. Patient with HOCM intracavitary pressure gradient of 64 mmHg increasing millimeter mercury with Valsalva maneuver, chronic diastolic heart failure grade 2 diastolic dysfunction, paroxysmal atrial fibrillation, primary hypertension, hypercholesterolemia, moderate  obesity,   It was felt to be wild strain cardiomyopathy in the absence of family history.  Patient is single and does not have any children or close relatives.  She has had a nonischemic nuclear stress test in August 2023 however TID was high and presumed to be due to subendocardial ischemia from HOCM.  Now admitted with worsening dyspnea, worsening leg edema and open wounds involving the left leg.  Her left leg ulcerations appear to be more consistent with pyoderma gangrenosum and not necessarily related to edema.  She has had right leg skin transplantation in the past for treatment of the same.  Suspect she will   need the same to be done on the left side for complete healing.  We will request primary team if they would have any recommendations regarding this.  Also she would like to know if she would have to apply any kind of cream or ointments for her left leg along with routine dressing.  With regard to acute on chronic diastolic heart failure, although strict I's and O's were not maintained, leg edema appears much improved and essentially now has nonpitting edema that is chronic.  There is no JVD.  She still has faint bibasilar crackles.  I will give her 1 dose of IV Lasix to see if she has performed diuresis.  I will schedule her for right  heart catheterization to evaluate cardiac output and cardiac index along with evaluation of pulmonary pressures.  Would like to know if her pulmonary hypertension is related to [facility] group 2 or 1.  Otherwise from cardiac standpoint, we will continue present medications and would reduce the dose of torsemide to 20 mg once a day and I will start her on amiodarone and possibly consider cardioversion to see if I can maintain sinus rhythm to improve diastolic compliance depending upon her right heart catheterization.  Otherwise she is ready for discharge after right heart catheterization.  Patient is willing to proceed.  I have discussed with her regarding complete  abstinence from alcohol.  She probably has severe sleep apnea and alcohol use probably contributing to worsening of her symptoms.  I have started her on Zoloft 50 mg in the evening, she will continue the same.    Adrian Prows, MD, [facility] 10/15/2021, 8:50 AM Office: [phone] Fax: [phone] Pager: [phone]

## 2021-10-18 ENCOUNTER — Telehealth: Payer: Self-pay

## 2021-10-18 ENCOUNTER — Other Ambulatory Visit: Payer: Self-pay

## 2021-10-18 DIAGNOSIS — F4321 Adjustment disorder with depressed mood: Secondary | ICD-10-CM

## 2021-10-18 MED ORDER — SERTRALINE HCL 50 MG PO TABS: 50.0000 mg | ORAL_TABLET | Freq: Every day | ORAL | 1 refills | Status: DC

## 2021-10-18 NOTE — Telephone Encounter (Signed)
[id number] [address], [address]  [place], [place] [address]   Please ask her if she plans to f/u [facility] or will continue to see [place]. She has no appointment and I had sent a message to [facility] this morning

## 2021-10-18 NOTE — Telephone Encounter (Signed)
ICD-10-CM   1. Situational depression  F43.21 sertraline (ZOLOFT) 50 MG tablet     Meds ordered this encounter  Medications   sertraline (ZOLOFT) 50 MG tablet    Sig: Take 1 tablet (50 mg total) by mouth at bedtime.    Dispense:  30 tablet    Refill:  1

## 2021-10-18 NOTE — Telephone Encounter (Signed)
Patient called back to make [place] aware her correct pharmacy is [facility] I resent medication there. Also patient stated she is going to f/u with you she is scheduled to come in Oct 13

## 2021-10-20 ENCOUNTER — Ambulatory Visit: Payer: Medicare Other | Admitting: Internal Medicine

## 2021-10-25 ENCOUNTER — Inpatient Hospital Stay: Payer: Self-pay | Admitting: Nurse Practitioner

## 2021-10-25 ENCOUNTER — Encounter (HOSPITAL_BASED_OUTPATIENT_CLINIC_OR_DEPARTMENT_OTHER): Payer: Medicare Other | Attending: Internal Medicine | Admitting: Internal Medicine

## 2021-10-25 DIAGNOSIS — I87332 Chronic venous hypertension (idiopathic) with ulcer and inflammation of left lower extremity: Secondary | ICD-10-CM | POA: Insufficient documentation

## 2021-10-25 DIAGNOSIS — L97228 Non-pressure chronic ulcer of left calf with other specified severity: Secondary | ICD-10-CM | POA: Insufficient documentation

## 2021-10-25 DIAGNOSIS — I480 Paroxysmal atrial fibrillation: Secondary | ICD-10-CM | POA: Insufficient documentation

## 2021-10-25 DIAGNOSIS — I110 Hypertensive heart disease with heart failure: Secondary | ICD-10-CM | POA: Insufficient documentation

## 2021-10-25 DIAGNOSIS — Z87891 Personal history of nicotine dependence: Secondary | ICD-10-CM | POA: Insufficient documentation

## 2021-10-25 DIAGNOSIS — I5032 Chronic diastolic (congestive) heart failure: Secondary | ICD-10-CM | POA: Insufficient documentation

## 2021-10-25 DIAGNOSIS — I421 Obstructive hypertrophic cardiomyopathy: Secondary | ICD-10-CM | POA: Insufficient documentation

## 2021-10-25 DIAGNOSIS — L03116 Cellulitis of left lower limb: Secondary | ICD-10-CM | POA: Insufficient documentation

## 2021-10-26 ENCOUNTER — Ambulatory Visit: Payer: Medicare Other | Admitting: Internal Medicine

## 2021-10-26 ENCOUNTER — Other Ambulatory Visit: Payer: Self-pay

## 2021-10-26 ENCOUNTER — Encounter: Payer: Self-pay | Admitting: Internal Medicine

## 2021-10-26 VITALS — BP 123/85 | HR 65 | Temp 97.8°F | Ht 64.0 in | Wt 205.0 lb

## 2021-10-26 DIAGNOSIS — L03116 Cellulitis of left lower limb: Secondary | ICD-10-CM

## 2021-10-26 DIAGNOSIS — L02416 Cutaneous abscess of left lower limb: Secondary | ICD-10-CM | POA: Diagnosis not present

## 2021-10-26 NOTE — Progress Notes (Signed)
[facility]      Reason for Consult:cellulitis    Referring Physician: Dr. Lonny Prude    Patient ID: Christine Jacobson, female    DOB: 1955-05-04, 66 y.o.   MRN: [id number]  HPI:   [name] is here for evaluation of her recent hospitalization for cellulitis. She was admitted 9/27-29 for ongoing left leg cellulitis and treated with IV ceftriaxone and discharged on oral cefadroxil to complete 7 more days.  She also has HOCM and on appropriate medications.  She was referred here and to wound care, though missed the wound care appointment.  No fever.  No leukocytosis.     Past Medical History:  Diagnosis Date   Chest pain 11/22/2010   2D STRESS ECHO - EF 60%, peak stress EF 80%, normal, no evidence for stress-induced ischemia   HOCM (hypertrophic obstructive cardiomyopathy) ([facility]) 06/21/2011   2D ECHO - EF >55%, normal   HTN (hypertension)    Hyperprolactinemia ([facility])    dx in her 18, took meds, self d/c a while back   Liver function test abnormality    normal when repeated   Mild hyperlipidemia    Obesity    Palpitations    negative stress echo in November 2012 with normal LV function; mild LVH, proximal septal thickening with narrow LVOT and mild gradient; mild MR and TR; Cardionet showed PACs in November 2012   Pyoderma gangrenosa    Shortness of breath 07/11/2011   MET TEST    Prior to Admission medications   Medication Sig Start Date End Date Taking? Authorizing Provider  acetaminophen (TYLENOL) 325 MG tablet Take 650 mg by mouth every 6 (six) hours as needed for mild pain or moderate pain.    [provider]  apixaban (ELIQUIS) 5 MG TABS tablet Take 1 tablet (5 mg total) by mouth 2 (two) times daily. 03/30/21   [name], NP  diltiazem (CARDIZEM CD) 180 MG 24 hr capsule Take 1 capsule (180 mg total) by mouth in the morning and at bedtime. 09/09/21 12/08/21  Adrian Prows, MD  empagliflozin (JARDIANCE) 10 MG TABS tablet Take 1 tablet (10 mg total) by  mouth daily before breakfast. 09/09/21   Adrian Prows, MD  metoprolol succinate (TOPROL-XL) 100 MG 24 hr tablet TAKE ONE TABLET BY MOUTH TWICE A DAY WITH OR IMMEDIATELY FOLLOWING A MEAL Patient taking differently: Take 100 mg by mouth in the morning and at bedtime. 05/25/21   [name], [name], MD  potassium chloride SA (KLOR-CON M) 20 MEQ tablet Take 1 tablet (20 mEq total) by mouth 2 (two) times daily. 09/22/21   Adrian Prows, MD  sertraline (ZOLOFT) 50 MG tablet Take 1 tablet (50 mg total) by mouth at bedtime. 10/18/21   Adrian Prows, MD  torsemide (DEMADEX) 20 MG tablet Take 1 tablet (20 mg total) by mouth 2 (two) times daily at 10 am and 4 pm. 09/22/21   Adrian Prows, MD    No Known Allergies  Social History   Tobacco Use   Smoking status: Former    Packs/day: 1.00    Years: 4.00    Total pack years: 4.00    Types: Cigarettes   Smokeless tobacco: Never  Vaping Use   Vaping Use: Never used  Substance Use Topics   Alcohol use: Yes    Alcohol/week: 1.0 standard drink of alcohol    Types: 1 Glasses of wine per week    Comment: "I do have my wine" every day, has cut  down from 2 bottles at a time, currently 2-3 glasses   Drug use: No    Family History  Problem Relation Age of Onset   Heart disease Mother        endocarditis   Ovarian cancer Mother    Emphysema Father    Coronary artery disease Other        F in his 34   Diabetes Other        GP   Cancer Neg Hx     Review of Systems  Constitutional: negative for fevers and chills All other systems reviewed and are negative    Constitutional: in no apparent distress  Respiratory: normal respiratory effort Musculoskeletal: bilateral leg edema, sloughing of skin, ruddy, no purulence, no warmth, no tenderness  Labs: Lab Results  Component Value Date   WBC 9.0 10/14/2021   HGB 12.9 10/15/2021   HGB 13.3 10/15/2021   HCT 38.0 10/15/2021   HCT 39.0 10/15/2021   MCV 94.4 10/14/2021   PLT 208 10/14/2021    Lab Results  Component  Value Date   CREATININE 1.26 (H) 10/15/2021   BUN 25 (H) 10/15/2021   NA 140 10/15/2021   NA 138 10/15/2021   K 3.3 (L) 10/15/2021   K 3.5 10/15/2021   CL 98 10/15/2021   CO2 29 10/15/2021    Lab Results  Component Value Date   ALT 128 (H) 05/26/2011   AST 83 (H) 05/26/2011   ALKPHOS 111 05/26/2011   BILITOT 0.7 05/26/2011   INR 0.9 05/26/2011     Assessment: cellulitis, s/p dalbavancin x 1 and then hospitalization with ceftriaxone > cefadroxil.  No current signs of infection.  I discussed nail care and wound care to reduce her chances of recurrence of cellulitis.   Plan: 1)  wound care  Follow up here as needed.

## 2021-10-29 ENCOUNTER — Ambulatory Visit: Payer: Medicare Other | Admitting: Cardiology

## 2021-10-29 ENCOUNTER — Encounter: Payer: Self-pay | Admitting: Cardiology

## 2021-10-29 VITALS — BP 110/77 | HR 73 | Temp 97.9°F | Resp 16 | Ht 64.0 in | Wt 205.8 lb

## 2021-10-29 DIAGNOSIS — F4321 Adjustment disorder with depressed mood: Secondary | ICD-10-CM

## 2021-10-29 DIAGNOSIS — I421 Obstructive hypertrophic cardiomyopathy: Secondary | ICD-10-CM

## 2021-10-29 DIAGNOSIS — I5032 Chronic diastolic (congestive) heart failure: Secondary | ICD-10-CM

## 2021-10-29 DIAGNOSIS — I10 Essential (primary) hypertension: Secondary | ICD-10-CM

## 2021-10-29 DIAGNOSIS — I4819 Other persistent atrial fibrillation: Secondary | ICD-10-CM

## 2021-10-29 MED ORDER — SERTRALINE HCL 50 MG PO TABS: 50.0000 mg | ORAL_TABLET | Freq: Every day | ORAL | 2 refills | Status: DC

## 2021-10-29 NOTE — Progress Notes (Signed)
Primary Physician/Referring:  Patient, No Pcp Per  Patient ID: Christine Jacobson, female    DOB: 1955/04/19, 66 y.o.   MRN: [id number]  Chief Complaint  Patient presents with   Hospitalization Follow-up   Hypertension   HPI:    Christine Jacobson  is a 66 y.o. 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  Patient is single and does not have any children or close relatives.  She has had a nonischemic nuclear stress test in August 2023 however TID was high and presumed to be due to subendocardial ischemia from HOCM.Marland Kitchen  Patient was admitted to [facility] on 10/13/2021 and discharged on 10/15/2021 with diuresis, wound care and antibiotic initiation.  She is also seen infectious disease 2 days ago and has now been referred for wound care.  Patient feels much improved with regard to dyspnea and the wound has well-healed as per ID.  She is feeling much improved, she has lost about 1520 pounds in weight, leg edema is improved.  Dyspnea has improved.  No chest pain or palpitations.  Past Medical History:  Diagnosis Date   Chest pain 11/22/2010   2D STRESS ECHO - EF 60%, peak stress EF 80%, normal, no evidence for stress-induced ischemia   HOCM (hypertrophic obstructive cardiomyopathy) ([place]) 06/21/2011   2D ECHO - EF >55%, normal   HTN (hypertension)    Hyperprolactinemia (HCC)    dx in her 18, took meds, self d/c a while back   Liver function test abnormality    normal when repeated   Mild hyperlipidemia    Obesity    Palpitations    negative stress echo in November 2012 with normal LV function; mild LVH, proximal septal thickening with narrow LVOT and mild gradient; mild MR and TR; Cardionet  showed PACs in November 2012   Pyoderma gangrenosa    Shortness of breath 07/11/2011   MET TEST   Past Surgical History:  Procedure Laterality Date   RIGHT HEART CATH N/A 10/15/2021   Procedure: RIGHT HEART CATH;  Surgeon: Nigel Mormon, MD;  Location: [place] [facility];  Service: Cardiovascular;  Laterality: N/A;   SKIN GRAFT  2006   porcine, R leg   Family History  Problem Relation Age of Onset   Heart disease Mother        endocarditis   Ovarian cancer Mother    Emphysema Father    Coronary artery disease Other        F in his 3   Diabetes Other        GP   Cancer Neg Hx     Social History   Tobacco Use   Smoking status: Former    Packs/day: 1.00    Years: 4.00    Total pack years: 4.00    Types: Cigarettes   Smokeless tobacco: Never  Substance Use Topics   Alcohol use: Not Currently    Alcohol/week: 1.0 standard drink of alcohol    Types: 1 Glasses of wine per week    Comment: "I do have my wine" every day, has cut down from 2 bottles at a time, currently 2-3 glasses   Marital Status: Single  ROS  Review of Systems  Constitutional: Positive for malaise/fatigue.  Cardiovascular:  Positive for dyspnea  on exertion and leg swelling. Negative for chest pain.  Musculoskeletal:  Positive for arthritis and back pain.   Objective  Blood pressure 110/77, pulse 73, temperature 97.9 F (36.6 C), temperature source Temporal, resp. rate 16, height _0  (1.626 m), weight 205 lb 12.8 oz (93.4 kg), SpO2 96 %. Body mass index is 35.33 kg/m.     10/29/2021    9:23 AM 10/26/2021    8:59 AM 10/15/2021   12:38 PM  Vitals with BMI  Height _1  _2    Weight 205 lbs 13 oz 205 lbs   BMI 50.93 26.71   Systolic 245 809   Diastolic 77 85   Pulse 73 65 0     Physical Exam Constitutional:      Appearance: She is obese.  Neck:     Vascular: No carotid bruit or JVD.  Cardiovascular:     Rate and Rhythm: Normal rate and regular rhythm.     Pulses:          Dorsalis  pedis pulses are 0 on the right side and 0 on the left side.       Posterior tibial pulses are 0 on the right side and 0 on the left side.     Heart sounds: Heart sounds are distant. Murmur heard.     Midsystolic murmur is present with a grade of 2/6 at the upper right sternal border.     No gallop.  Pulmonary:     Effort: Pulmonary effort is normal.     Breath sounds: Normal breath sounds.  Abdominal:     General: Bowel sounds are normal.     Palpations: Abdomen is soft.     Comments: Obese. Pannus present  Musculoskeletal:     Right lower leg: Edema (Nonpitting edema with chronic venous stasis changes and thick skin.) present.     Left lower leg: Edema (Nonpitting edema with chronic venous stasis changes and thick skin.) present.  Skin:    Findings: Lesion (cellulitis left and also right shin) present.  Neurological:     Deep Tendon Reflexes: Abnormal reflex: chronic diastolic hea.    Medications and allergies  No Known Allergies   Medication list after today's encounter   Current Outpatient Medications:    apixaban (ELIQUIS) 5 MG TABS tablet, Take 1 tablet (5 mg total) by mouth 2 (two) times daily., Disp: 60 tablet, Rfl: 5   diltiazem (CARDIZEM CD) 180 MG 24 hr capsule, Take 1 capsule (180 mg total) by mouth in the morning and at bedtime., Disp: 60 capsule, Rfl: 2   empagliflozin (JARDIANCE) 10 MG TABS tablet, Take 1 tablet (10 mg total) by mouth daily before breakfast., Disp: 30 tablet, Rfl: 1   IBUPROFEN PO, Take by mouth as needed., Disp: , Rfl:    metoprolol succinate (TOPROL-XL) 100 MG 24 hr tablet, TAKE ONE TABLET BY MOUTH TWICE A DAY WITH OR IMMEDIATELY FOLLOWING A MEAL (Patient taking differently: Take 100 mg by mouth in the morning and at bedtime.), Disp: 180 tablet, Rfl: 3   potassium chloride SA (KLOR-CON M) 20 MEQ tablet, Take 1 tablet (20 mEq total) by mouth 2 (two) times daily., Disp: 30 tablet, Rfl: 1   torsemide (DEMADEX) 20 MG tablet, Take 1 tablet (20 mg total)  by mouth 2 (two) times daily at 10 am and 4 pm., Disp: 60 tablet, Rfl: 2   sertraline (ZOLOFT) 50 MG tablet, Take 1 tablet (50 mg total) by mouth at bedtime., Disp: 30 tablet,  Rfl: 2  Laboratory examination:   Lab Results  Component Value Date   NA 140 10/15/2021   NA 138 10/15/2021   K 3.3 (L) 10/15/2021   K 3.5 10/15/2021   CO2 29 10/15/2021   GLUCOSE 100 (H) 10/15/2021   BUN 25 (H) 10/15/2021   CREATININE 1.26 (H) 10/15/2021   CALCIUM 8.9 10/15/2021   EGFR 53 (L) 03/29/2021   GFRNONAA 47 (L) 10/15/2021   estimated creatinine clearance is 47.4 mL/min (A) (by C-G formula based on SCr of 1.26 mg/dL (H)).     Latest Ref Rng & Units 10/15/2021   11:19 AM 10/15/2021   12:39 AM 10/14/2021   12:42 AM  CMP  Glucose 70 - 99 mg/dL  100  129   BUN 8 - 23 mg/dL  25  42   Creatinine 0.44 - 1.00 mg/dL  1.26  1.80   Sodium 135 - 145 mmol/L 135 - 145 mmol/L 140    138  138  137   Potassium 3.5 - 5.1 mmol/L 3.5 - 5.1 mmol/L 3.3    3.5  3.2  3.7   Chloride 98 - 111 mmol/L  98  101   CO2 22 - 32 mmol/L  29  24   Calcium 8.9 - 10.3 mg/dL  8.9  9.6       Latest Ref Rng & Units 10/15/2021   11:19 AM 10/14/2021   12:42 AM 10/13/2021   12:06 PM  CBC  WBC 4.0 - 10.5 K/uL  9.0  8.4   Hemoglobin 12.0 - 15.0 g/dL 12.0 - 15.0 g/dL 12.9    13.3  13.1  14.5   Hematocrit 36.0 - 46.0 % 36.0 - 46.0 % 38.0    39.0  39.1  42.7   Platelets 150 - 400 K/uL  208  223    Lipid Panel Recent Labs    10/14/21 0042  CHOL 232*  TRIG 191*  LDLCALC 142*  VLDL 38  HDL 52  CHOLHDL 4.5    HEMOGLOBIN A1C No results found for: "HGBA1C", "MPG" TSH No results for input(s): "TSH" in the last 8760 hours.  Radiology:   Chest x-ray 07/18/2019: There is mild left midlung atelectasis. Lungs elsewhere are clear. Heart is upper normal in size with pulmonary vascularity normal. No adenopathy. No pneumothorax. There is degenerative change in the thoracic spine.  Cardiac Studies:   PCV ECHOCARDIOGRAM  COMPLETE 09/02/2021  Narrative Echocardiogram 08/17/2021: 1. Normal LV systolic function with visual EF 60-65%. Left ventricle cavity is normal in size. Hypertrophic cardiomyopathy. Severe concentric hypertrophy of the left ventricle. Normal global wall motion. Ungraded restrictive diastolic dysfunction, elevated LAP. 2. Left atrial cavity is severely dilated at 5.4 cm. 3. Right atrial cavity is moderately dilated. 4. Right ventricle cavity is mild to moderately dilated. Normal right ventricular function. 5. Mild (Grade I) mitral regurgitation. Systolic anterior motion of MV noted. Moderate mitral valve posterior leaflet calcification. E-wave dominant mitral inflow. 6. Structurally normal tricuspid valve. Moderate tricuspid regurgitation. Moderate pulmonary hypertension. RVSP measures 45 mmHg. 7. IVC is dilated with respiratory variation. 9. Recommend TEE.  Compared to the study done on 07/29/2017 report, RV dilatation is new.  Otherwise no significant change.  PCV MYOCARDIAL PERFUSION WITH LEXISCAN 08/23/2021  Myocardial perfusion is normal. Overall LV systolic function is normal without regional wall motion abnormalities. Stress LV EF: 63%. Abnormal ECG stress, positive for ischemic changes in inferior leads. TID ratio 1.28, which is abnormal therefore cannot r/o balanced ischemia given positive EKG and  TID. No previous exam available for comparison. High risk study.  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  Cannot exclude inferior and lateral ischemia.  Compared to 09/09/2021, lateral ST depression slightly more prominent.  Assessment     ICD-10-CM   1. Chronic diastolic  (congestive) heart failure (HCC)  I50.32     2. Persistent atrial fibrillation (HCC)  I48.19 EKG 12-Lead    3. HOCM (hypertrophic obstructive cardiomyopathy) (HCC)  I42.1     4. Primary hypertension  I10     5. Situational depression  F43.21 sertraline (ZOLOFT) 50 MG tablet       Orders Placed This Encounter  Procedures   EKG 12-Lead   Meds ordered this encounter  Medications   sertraline (ZOLOFT) 50 MG tablet    Sig: Take 1 tablet (50 mg total) by mouth at bedtime.    Dispense:  30 tablet    Refill:  2   Medications Discontinued During This Encounter  Medication Reason   acetaminophen (TYLENOL) 325 MG tablet    sertraline (ZOLOFT) 50 MG tablet Reorder     Recommendations:   Melvenia Favela is a 66 y.o.  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  Patient is single and does not have any children or close relatives.  She has had a nonischemic nuclear stress test in August 2023 however TID was high and presumed to be due to subendocardial ischemia from HOCM.Marland Kitchen  Patient was admitted to [facility] on 10/13/2021 and discharged on 10/15/2021 with diuresis, wound care and antibiotic initiation.  She is also seen infectious disease 2 days ago and has now been referred for wound care.  Patient feels much improved with regard to dyspnea and the wound has well-healed as per ID.  Today there is no clinical evidence of acute decompensated heart failure.  Lungs are clear.  Atrial fibrillation is rate controlled.  She is tolerating anticoagulation with Eliquis well.  Blood pressure is well controlled.  She needs to be on an ARB however due to patient being overwhelmed, will initiate this  on her next office visit.  For now I did not make any changes to her medications.  She will need repeat labs which I will also do this on her next office visit.  I reviewed with her that she would benefit from direct-current cardioversion which she wants to think about this for now as she has had a lot going on.  I refilled her prescription for Zoloft, she did do well on this when she was in the hospital but the prescription was never picked up again.  I suspect she will need 100 mg.  She would be a good candidate for OCEANIC-AF (Asundexian - factor XIa inhibitor PO BID vs Apixaban PO BID in patients with A. Fib for stroke prevention.     Adrian Prows, MD, [facility] 10/29/2021, 10:13 AM Office: [phone]

## 2021-11-03 ENCOUNTER — Telehealth: Payer: Self-pay

## 2021-11-03 NOTE — Telephone Encounter (Signed)
Any medication can do this. Doubt though. She can hold this for 1 week and re challenge herself with the medication and see

## 2021-11-03 NOTE — Telephone Encounter (Signed)
Patient called and states that she would like to know if there may be another way of taking her medication ( Jardiance 10mg ) she states that this medication is causing her to become sick. She states that this has been happening for a while right now but yesterday while at work she got sick; vomiting, nausea and dizziness. She wanted to know was this a side effect or not.   Please advise

## 2021-11-04 ENCOUNTER — Encounter (HOSPITAL_BASED_OUTPATIENT_CLINIC_OR_DEPARTMENT_OTHER): Payer: Medicare Other | Admitting: Internal Medicine

## 2021-11-04 DIAGNOSIS — I5032 Chronic diastolic (congestive) heart failure: Secondary | ICD-10-CM | POA: Diagnosis not present

## 2021-11-04 DIAGNOSIS — L97228 Non-pressure chronic ulcer of left calf with other specified severity: Secondary | ICD-10-CM | POA: Diagnosis not present

## 2021-11-04 DIAGNOSIS — I87332 Chronic venous hypertension (idiopathic) with ulcer and inflammation of left lower extremity: Secondary | ICD-10-CM | POA: Diagnosis present

## 2021-11-04 DIAGNOSIS — I480 Paroxysmal atrial fibrillation: Secondary | ICD-10-CM | POA: Diagnosis not present

## 2021-11-04 DIAGNOSIS — I110 Hypertensive heart disease with heart failure: Secondary | ICD-10-CM | POA: Diagnosis not present

## 2021-11-04 DIAGNOSIS — I421 Obstructive hypertrophic cardiomyopathy: Secondary | ICD-10-CM | POA: Diagnosis not present

## 2021-11-04 DIAGNOSIS — Z87891 Personal history of nicotine dependence: Secondary | ICD-10-CM | POA: Diagnosis not present

## 2021-11-04 DIAGNOSIS — L03116 Cellulitis of left lower limb: Secondary | ICD-10-CM | POA: Diagnosis not present

## 2021-11-04 NOTE — Telephone Encounter (Signed)
Spoke with patient and she gave a verbal understanding of message above

## 2021-11-04 NOTE — Progress Notes (Signed)
Christine Jacobson ([id number]) [id number] Page 1 of 4 Visit Report for 11/04/2021 Abuse Risk Screen Details Patient Name: Date of Service: Christine Jacobson, Christine Jacobson 11/04/2021 8:00 A M Medical Record Number: [id number] Patient Account Number: [id number] Date of Birth/Sex: Treating RN: June 25, 1955 (66 y.o. Female) Sharyn Creamer Primary Care Chet Greenley: PA Haig Prophet, [place] Other Clinician: Referring Noriko Macari: Treating Gudrun Axe/Extender: [name] in Treatment: 0 Abuse Risk Screen Items Answer ABUSE RISK SCREEN: Has anyone close to you tried to hurt or harm you recentlyo No Do you feel uncomfortable with anyone in your familyo No Has anyone forced you do things that you didnt want to doo No Electronic Signature(s) Signed: 11/04/2021 5:03:48 PM By: Sharyn Creamer RN, BSN Entered By: Sharyn Creamer on 11/04/2021 08:37:01 -------------------------------------------------------------------------------- Activities of Daily Living Details Patient Name: Date of Service: Christine Jacobson 11/04/2021 8:00 A M Medical Record Number: [id number] Patient Account Number: [id number] Date of Birth/Sex: Treating RN: January 16, 1956 (66 y.o. Female) Sharyn Creamer Primary Care [name]: PA Haig [place], [place] Other Clinician: Referring Cherylin Waguespack: Treating Saesha Llerenas/Extender: [name] in Treatment: 0 Activities of Daily Living Items Answer Activities of Daily Living (Please select one for each item) Drive Automobile Completely Able T Medications ake Completely Able Use T elephone Completely Able Care for Appearance Completely Able Use T oilet Completely Able Bath / Shower Completely Able Dress Self Completely Able Feed Self Completely Able Walk Completely Able Get In / Out Bed Completely Able Housework Completely Able Prepare Meals Completely Able Handle Money Completely Able Shop for Self Completely Able Electronic Signature(s) Signed:  11/04/2021 5:03:48 PM By: Sharyn Creamer RN, BSN Entered By: Sharyn Creamer on 11/04/2021 08:37:29 Cotto, [name] ([id number]) [id number] Page 2 of 4 -------------------------------------------------------------------------------- Education Screening Details Patient Name: Date of Service: Christine Jacobson 11/04/2021 8:00 A M Medical Record Number: [id number] Patient Account Number: [id number] Date of Birth/Sex: Treating RN: [birth year] (66 y.o. Female) Sharyn Creamer Primary Care [name]: PA Haig [place], [place] Other Clinician: Referring [name]: Treating [name]/Extender: [name] in Treatment: 0 Learning Preferences/Education Level/Primary Language Learning Preference: Explanation, Demonstration, Printed Material Preferred Language: English Cognitive Barrier Language Barrier: No Translator Needed: No Memory Deficit: No Emotional Barrier: No Cultural/Religious Beliefs Affecting Medical Care: No Physical Barrier Impaired Vision: No Impaired Hearing: No Decreased Hand dexterity: No Knowledge/Comprehension Knowledge Level: High Comprehension Level: High Ability to understand written instructions: High Ability to understand verbal instructions: High Motivation Anxiety Level: Anxious Cooperation: Cooperative Education Importance: Acknowledges Need Interest in Health Problems: Asks Questions Perception: Coherent Willingness to Engage in Self-Management High Activities: Readiness to Engage in Self-Management High Activities: Electronic Signature(s) Signed: 11/04/2021 5:03:48 PM By: Sharyn Creamer RN, BSN Entered By: Sharyn Creamer on 11/04/2021 08:38:00 -------------------------------------------------------------------------------- Fall Risk Assessment Details Patient Name: Date of Service: Christine Jacobson 11/04/2021 8:00 A M Medical Record Number: [id number] Patient Account Number: [id number] Date of Birth/Sex: Treating  RN: 12-19-1955 (66 y.o. Female) Sharyn Creamer Primary Care [name]: PA [place], [place] Other Clinician: Referring Kitti Mcclish: Treating [name]/Extender: [name] in Treatment: 0 Fall Risk Assessment Items Have you had 2 or more falls in the last 12 monthso 0 No Have you had any fall that resulted in injury in the last 12 monthso 0 No FALLS RISK SCREEN Mascio, [name] ([id number]) [id number] Page 3 of 4 History of falling - immediate or within 3 months 0 No Secondary diagnosis (Do you have 2 or more medical diagnoseso) 15 Yes Ambulatory aid None/bed rest/wheelchair/nurse 0 Yes Crutches/cane/walker 0 No Furniture 0  No Intravenous therapy Access/Saline/Heparin Lock 0 No Gait/Transferring Normal/ bed rest/ wheelchair 0 Yes Weak (short steps with or without shuffle, stooped but able to lift head while walking, may seek 0 No support from furniture) Impaired (short steps with shuffle, may have difficulty arising from chair, head down, impaired 0 No balance) Mental Status Oriented to own ability 0 No Electronic Signature(s) Signed: 11/04/2021 5:03:48 PM By: Redmond Pulling RN, BSN Entered By: Redmond Pulling on 11/04/2021 08:38:45 -------------------------------------------------------------------------------- Foot Assessment Details Patient Name: Date of Service: Christine Jacobson 11/04/2021 8:00 A M Medical Record Number: [id number] Patient Account Number: [id number] Date of Birth/Sex: Treating RN: 03/11/1955 (66 y.o. Female) Redmond Pulling Primary Care Airon Sahni: PA [place], [place] Other Clinician: Referring Promise Bushong: Treating Charlayne Vultaggio/Extender: Ephriam Jenkins in Treatment: 0 Foot Assessment Items Site Locations + = Sensation present, - = Sensation absent, C = Callus, U = Ulcer R = Redness, W = Warmth, M = Maceration, PU = Pre-ulcerative lesion F = Fissure, S = Swelling, D = Dryness Assessment Right: Left: Other  Deformity: No No Prior Foot Ulcer: No No Prior Amputation: No No Charcot Joint: No No Ambulatory Status: Gait: Christine Jacobson ([id number]) [id number] Page 4 of 4 Electronic Signature(s) Signed: 11/04/2021 5:03:48 PM By: Redmond Pulling RN, BSN Entered By: Redmond Pulling on 11/04/2021 08:41:24 -------------------------------------------------------------------------------- Nutrition Risk Screening Details Patient Name: Date of Service: SHAKHIA, GRAMAJO Jacobson 11/04/2021 8:00 A M Medical Record Number: [id number] Patient Account Number: [id number] Date of Birth/Sex: Treating RN: June 27, 1955 (66 y.o. Female) Redmond Pulling Primary Care Sayde Lish: PA Zenovia Jordan, [place] Other Clinician: Referring Craig Ionescu: Treating Lakiya Cottam/Extender: Ephriam Jenkins in Treatment: 0 Height (in): 64 Weight (lbs): 203 Body Mass Index (BMI): 34.8 Nutrition Risk Screening Items Score Screening NUTRITION RISK SCREEN: I have an illness or condition that made me change the kind and/or amount of food I eat 2 Yes I eat fewer than two meals per day 3 Yes I eat few fruits and vegetables, or milk products 0 No I have three or more drinks of beer, liquor or wine almost every day 0 No I have tooth or mouth problems that make it hard for me to eat 0 No I don't always have enough money to buy the food I need 0 No I eat alone most of the time 1 Yes I take three or more different prescribed or over-the-counter drugs a day 1 Yes Without wanting to, I have lost or gained 10 pounds in the last six months 2 Yes I am not always physically able to shop, cook and/or feed myself 0 No Nutrition Protocols Good Risk Protocol Moderate Risk Protocol High Risk Proctocol Risk Level: High Risk Score: 9 Electronic Signature(s) Signed: 11/04/2021 5:03:48 PM By: Redmond Pulling RN, BSN Entered By: Redmond Pulling on 11/04/2021 08:39:21

## 2021-11-04 NOTE — Telephone Encounter (Signed)
LVM for patient to return call to discuss above message

## 2021-11-04 NOTE — Progress Notes (Signed)
Christine Jacobson, Christine Jacobson ([id number]) [id number] Page 1 of 10 Visit Report for 11/04/2021 Chief Complaint Document Details Patient Name: Date of Service: Christine Jacobson, Christine Jacobson 11/04/2021 8:00 A M Medical Record Number: [id number] Patient Account Number: [id number] Date of Birth/Sex: Treating RN: 05-09-1955 (66 y.o. Female) Primary Care Provider: PA Jacobson, [place] Other Clinician: Referring Provider: Treating Provider/Extender: Christine Jacobson in Treatment: 0 Information Obtained from: Patient Chief Complaint 11/04/2021; patient is here for review of wounds on the left lower leg referred by Dr. Linus Jacobson Electronic Signature(s) Signed: 11/04/2021 4:29:29 PM By: Christine Ham MD Entered By: Christine Jacobson on 11/04/2021 09:41:51 -------------------------------------------------------------------------------- HPI Details Patient Name: Date of Service: Christine [name] 11/04/2021 8:00 A M Medical Record Number: [id number] Patient Account Number: [id number] Date of Birth/Sex: Treating RN: Aug 03, 1955 (66 y.o. Female) Primary Care Provider: PA Jacobson, [place] Other Clinician: Referring Provider: Treating Provider/Extender: Christine Jacobson in Treatment: 0 History of Present Illness HPI Description: ADMISSION 11/04/2021 This is a 65 year old woman who comes into the clinic for review of wounds on the left leg. This includes several small areas on the left lateral lower leg as well as a substantial area on the left posterior calf. The patient was referred by Dr. Linus Jacobson of infectious disease. She says that this began in the end of September when she developed lower extremity swelling and pain. She was seen in urgent care and put on Dalbvancin. However she required admission from 9/21 through 9/27 and was treated with ceftriaxone outpatient cefadroxil. A CT scan of the leg was negative for abscess. She was referred as an outpatient to Dr. Arelia Jacobson who saw her  on October 10 did not feel she had any additional infectious issues and referred her here. 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 She has been using a cream she had on the wounds on her left leg left over from that timeframe. Past medical history includes recent diagnosis of chronic diastolic heart failure, paroxysmal A-fib on Eliquis, HOCM/ASH, hypertension and a history of pyoderma that is a self-report. ABI in this clinic was 0.63. 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 Female) Primary Care Provider: PA Jacobson, [place] Other Clinician: Referring Provider: Treating Provider/Extender: Christine Jacobson in Treatment: 0 Constitutional Sitting or standing Blood Pressure is within target range for patient.. Pulse regular and within target range for patient.Marland Kitchen Respirations regular, non-labored and within target range.. Temperature is normal and within the target range for the patient.Marland Kitchen Appears in no distress. Respiratory work of breathing is normal. Bilateral breath sounds are clear and equal in all lobes with no wheezes, rales or rhonchi.. Cardiovascular 2 out of 6 short midsystolic murmur JVP was not elevated no gallops no sacral edema. Both her dorsalis pedis and posterior tibial pulses are  palpable however she has a delayed capillary refill. Pitting edema of the left leg, what appears to be stasis dermatitis. She has a healed area on the right anterior mid tibia  which she says was the plan years ago. Notes Wound exam; she has small wounds on the left lateral calf but a substantial area on the left posterior calf that is covered in a completely adherent fibrinous yellow surface. I used a #5 curette and simply touch that this area over the surface this will not be an easy debridement and the patient barely could tolerate me touching this. Electronic Signature(s) Signed: 11/04/2021 4:29:29 PM By: Christine Ham MD Entered By: Christine Jacobson on 11/04/2021 09:47:38 -------------------------------------------------------------------------------- Physician Orders Details Patient Name: Date of Service: Christine Jacobson, Christine Jacobson 11/04/2021 8:00 A M Medical Record Number: [id number] Patient Account Number: [id number] Date of Birth/Sex: Treating RN: 1955/12/04 (66 y.o. Female) Christine Jacobson Primary Care Provider: PA Haig Jacobson, [place] Other Clinician: Referring Provider: Treating Provider/Extender: Christine Jacobson in Treatment: 0 Verbal / Phone Orders: No Diagnosis Coding Follow-up Appointments ppointment in 1 week. - Dr. Celine Jacobson - Room 3 Return A Anesthetic Wound #1 Left,Posterior Lower Leg (In clinic) Topical Lidocaine 5% applied to wound bed Bathing/ Shower/ Hygiene May shower with protection but do not get wound dressing(s) wet. - purchase cast protector from [facility], [facility] or [employer] Edema Control - Lymphedema / SCD / Other Bilateral Lower Extremities Elevate legs to the level of the heart or above for 30 minutes daily and/or when sitting, a frequency of: Avoid standing for long periods of time. Exercise regularly - walking daily Moisturize legs daily. - right leg Wound Treatment Wound #1 - Lower Leg Wound Laterality: Left, Posterior Christine Jacobson, Christine Jacobson ([id number])  [id number] Page 3 of 10 Cleanser: Soap and Water 1 x Per Week/30 Days Discharge Instructions: May shower and wash wound with dial antibacterial soap and water prior to dressing change. Peri-Wound Care: Triamcinolone 15 (g) 1 x Per Week/30 Days Discharge Instructions: Use triamcinolone 15 (g) as directed Peri-Wound Care: Sween Lotion (Moisturizing lotion) 1 x Per Week/30 Days Discharge Instructions: Apply moisturizing lotion as directed Prim Dressing: IODOFLEX 0.9% Cadexomer Iodine Pad 4x6 cm 1 x Per Week/30 Days ary Discharge Instructions: Apply to wound bed as instructed Secondary Dressing: ABD Pad, 8x10 1 x Per Week/30 Days Discharge Instructions: Apply over primary dressing as directed. Secondary Dressing: Woven Gauze Sponge, Non-Sterile 4x4 in 1 x Per Week/30 Days Discharge Instructions: Apply over primary dressing as directed. Secured With: Coban Self-Adherent Wrap 4x5 (in/yd) 1 x Per Week/30 Days Discharge Instructions: Secure with Coban as directed. Secured With: [employer], 4.5x3.1 (in/yd) 1 x Per Week/30 Days Discharge Instructions: Secure with Kerlix as directed. Wound #2 - Lower Leg Wound Laterality: Left, Lateral Cleanser: Soap and Water 1 x Per Week/30 Days Discharge Instructions: May shower and wash wound with dial antibacterial soap and water prior to dressing change. Peri-Wound Care: Triamcinolone 15 (g) 1 x Per Week/30 Days Discharge Instructions: Use triamcinolone 15 (g) as directed Peri-Wound Care: Sween Lotion (Moisturizing lotion) 1 x Per Week/30 Days Discharge Instructions: Apply moisturizing lotion as directed Prim Dressing: IODOFLEX 0.9% Cadexomer Iodine Pad 4x6 cm 1 x Per Week/30 Days ary Discharge Instructions: Apply to wound bed as instructed Secondary Dressing: ABD Pad, 8x10 1 x Per Week/30 Days Discharge Instructions: Apply over primary dressing as directed. Secondary Dressing: Woven Gauze Sponge, Non-Sterile 4x4 in 1 x  Per Week/30 Days Discharge Instructions: Apply over primary dressing as directed. Secured With: Coban Self-Adherent Wrap 4x5 (in/yd) 1 x Per Week/30 Days Discharge Instructions: Secure with Coban as directed. Secured With: [employer], 4.5x3.1 (in/yd) 1 x Per Week/30 Days Discharge Instructions: Secure with  Kerlix as directed. Services and Therapies rterial Studies- Bilateral with ABI/TBI - Abnormal arterial studies in clinic with non healing ulcer on Left lower leg A Venous Studies -Bilateral with reflux - Bilateral lower extremities swelling with non healing ulcer to left lower leg Patient Medications llergies: No Known Allergies A Notifications Medication Indication Start End prior to debridement 11/04/2021 lidocaine DOSE topical 5 % ointment - ointment topical Electronic Signature(s) Signed: 11/04/2021 4:29:29 PM By: Christine Ham MD Signed: 11/04/2021 5:03:48 PM By: Christine Creamer RN, BSN Entered By: Christine Jacobson on 11/04/2021 12:23:40 Christine Jacobson, Christine Jacobson ([id number]) [id number] Page 4 of 10 Prescription 11/04/2021 Haider Crissie -------------------------------------------------------------------------------- , Christine Ham MD Patient Name: Provider: Oct 31, 1955 [id number] Date of Birth: NPI#: Female [id number] Sex: DEA #: [phone] [id number] Phone #: License #: [place] Patient Address: [facility] [address] [place], [place] [address] [place], [place] [address] [phone] Allergies No Known Allergies Provider's Orders rterial Studies- Bilateral with ABI/TBI - Abnormal arterial studies in clinic with non healing ulcer on Left lower leg A Hand Signature: Date(s): Prescription 11/04/2021 Renard Matter MD Patient Name: Provider: 1955/03/09 [id number] Date of Birth: NPI#: Female [id number] Sex: DEA #: [phone] [id number] Phone #: License #: [place] Patient Address: [facility] [address] [place], [place] [address] [place], [place] [address] [phone] Allergies No Known Allergies Provider's Orders Venous Studies -Bilateral with reflux - Bilateral lower extremities swelling with non healing ulcer to left lower leg Hand Signature: Date(s): Electronic Signature(s) Signed: 11/04/2021 4:29:29 PM By: Christine Ham MD Signed: 11/04/2021 5:03:48 PM By: Christine Creamer RN, BSN Entered By: Christine Jacobson on 11/04/2021 12:23:41 -------------------------------------------------------------------------------- Problem List Details Patient Name: Date of Service: BERTILLA, SHATTLES Jacobson 11/04/2021 8:00 A M Medical Record Number: [id number] Patient Account Number: [id number] Date of Birth/Sex: Treating RN: [birth year] (66 y.o. Female) Primary Care Provider: PA Jacobson, [place] Other Clinician: Referring Provider: Treating Provider/Extender: Christine Jacobson in Treatment: 0 Birney, Raseel ([id number]) [id number] Page 5 of 10 Active Problems ICD-10 Encounter Code Description Active Date MDM Diagnosis I87.332 Chronic venous hypertension (idiopathic) with ulcer and inflammation of left 11/04/2021 No Yes lower extremity L97.228 Non-pressure chronic ulcer of left calf with other specified severity 11/04/2021 No Yes L03.116 Cellulitis of left lower limb 11/04/2021 No Yes Inactive Problems Resolved Problems Electronic Signature(s) Signed: 11/04/2021 4:29:29 PM By: Christine Ham MD Entered By: Christine Jacobson on 11/04/2021 09:41:04 -------------------------------------------------------------------------------- Progress Note Details Patient Name: Date of Service: Christine Jacobson, Christine Jacobson 11/04/2021 8:00 A M Medical Record Number: [id number] Patient Account Number: [id number] Date of Birth/Sex: Treating RN: 11-30-1955 (66 y.o. Female) Primary Care Provider:  PA Jacobson, [place] Other Clinician: Referring Provider: Treating Provider/Extender: Christine Jacobson in Treatment: 0 Subjective Chief Complaint Information obtained from Patient 11/04/2021; patient is here for review of wounds on the left lower leg referred by Dr. Linus Jacobson History of Present Illness (HPI) ADMISSION 11/04/2021 This is a 66 year old woman who comes into the clinic for review of wounds on the left leg. This includes several small areas on the left lateral lower leg as well as a substantial area on the left posterior calf. The patient was referred by Dr. Linus Jacobson of infectious disease. She says that this began in the end of September when she developed lower extremity swelling and pain. She was seen in urgent care and put on Dalbvancin. However she required admission from 9/21 through  9/27 and was treated with ceftriaxone outpatient cefadroxil. A CT scan of the leg was negative for abscess. She was referred as an outpatient to Dr. Arelia Jacobson who saw her on October 10 did not feel she had any additional infectious issues and referred her here. 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 She has been using a cream she had on the wounds on her left leg left over from that timeframe. Past medical history includes recent diagnosis of chronic diastolic heart failure, paroxysmal A-fib on Eliquis, HOCM/ASH, hypertension and a history of pyoderma that is a self-report. ABI in this clinic was 0.63. The patient is a non-smoker she is not a known diabetic Patient History Information obtained from Patient. Allergies No Known Allergies Family History Cancer - Mother, Diabetes - Maternal Grandparents, Hypertension - Father, Lung Disease - Father, Stroke - Maternal Grandparents. Christine Jacobson, Christine Jacobson ([id number])  [id number] Page 6 of 10 Social History Former smoker - quit 20+ years ago, Marital Status - Single, Alcohol Use - Never, Drug Use - No History, Caffeine Use - Never. Medical History Hematologic/Lymphatic Patient has history of Lymphedema Cardiovascular Patient has history of Congestive Heart Failure, Hypertension Hospitalization/Surgery History - heart cath 10/15/21. Medical A Surgical History Notes nd Cardiovascular cardiomyopathy hyperlipidemia Psychiatric depression/anxiety Review of Systems (ROS) Constitutional Symptoms (General Health) Complains or has symptoms of Chills. Denies complaints or symptoms of Fever. Eyes Complains or has symptoms of Glasses / Contacts. Ear/Nose/Mouth/Throat Denies complaints or symptoms of Chronic sinus problems or rhinitis. Respiratory Complains or has symptoms of Shortness of Breath. Gastrointestinal Complains or has symptoms of Vomiting. Denies complaints or symptoms of Frequent diarrhea, Nausea, minor vomiting possibly related to medicaiton changes, pcp treating Endocrine Denies complaints or symptoms of Heat/cold intolerance. Genitourinary Denies complaints or symptoms of Frequent urination. Integumentary (Skin) Complains or has symptoms of Wounds - left lower leg. Musculoskeletal Denies complaints or symptoms of Muscle Pain, Muscle Weakness. Neurologic Denies complaints or symptoms of Numbness/parasthesias. Objective Constitutional Sitting or standing Blood Pressure is within target range for patient.. Pulse regular and within target range for patient.Marland Kitchen Respirations regular, non-labored and within target range.. Temperature is normal and within the target range for the patient.Marland Kitchen Appears in no distress. Vitals Time Taken: 8:26 AM, Height: 64 in, Source: Stated, Weight: 203 lbs, Source: Stated, BMI: 34.8, Temperature: 97.7 F, Pulse: 62 bpm, Respiratory Rate: 18 breaths/min, Blood Pressure: 111/73  mmHg. Respiratory work of breathing is normal. Bilateral breath sounds are clear and equal in all lobes with no wheezes, rales or rhonchi.. Cardiovascular 2 out of 6 short midsystolic murmur JVP was not elevated no gallops no sacral edema. Both her dorsalis pedis and posterior tibial pulses are palpable however she has a delayed capillary refill. Pitting edema of the left leg, what appears to be stasis dermatitis. She has a healed area on the right anterior mid tibia which she says was the plan years ago. General Notes: Wound exam; she has small wounds on the left lateral calf but a substantial area on the left posterior calf that is covered in a completely adherent fibrinous yellow surface. I used a #5 curette and simply touch that this area over the surface this will not be an easy debridement and the patient barely could tolerate me touching this. Integumentary (Hair, Skin) Wound #1 status is Open. Original cause  of wound was Gradually Appeared. The date acquired was: 08/17/2021. The wound is located on the Left,Posterior Lower Leg. The wound measures 9cm length x 7cm width x 0.2cm depth; 49.48cm^2 area and 9.896cm^3 volume. There is Fat Layer (Subcutaneous Tissue) exposed. There is no tunneling or undermining noted. There is a medium amount of serosanguineous drainage noted. There is no granulation within the wound bed. There is a large (67-100%) amount of necrotic tissue within the wound bed including Adherent Slough. The periwound skin appearance exhibited: Dry/Scaly, Erythema. The surrounding wound skin color is noted with erythema which is circumferential. Periwound temperature was noted as No Abnormality. The periwound has tenderness on palpation. Wound #2 status is Open. Original cause of wound was Gradually Appeared. The date acquired was: 08/17/2021. The wound is located on the Left,Lateral Lower Leg. The wound measures 4cm length x 3.7cm width x 0.1cm depth; 11.624cm^2 area and 1.162cm^3  volume. There is Fat Layer (Subcutaneous Tissue) exposed. There is no tunneling or undermining noted. There is a medium amount of serosanguineous drainage noted. There is small (1-33%) Christine Jacobson granulation within the wound bed. There is a large (67-100%) amount of necrotic tissue within the wound bed including Eschar and Adherent Slough. The periwound skin appearance exhibited: Dry/Scaly, Erythema. The surrounding wound skin color is noted with erythema which is circumferential. Periwound temperature was noted as No Abnormality. The periwound has tenderness on palpation. 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 - Dr. Celine Jacobson - Room 3 Anesthetic: Wound #1 Left,Posterior Lower Leg: (In clinic) Topical Lidocaine 5% applied to wound bed Bathing/ Shower/ Hygiene: May shower with protection but do not get wound dressing(s) wet. - purchase cast protector from [facility], [facility] or [employer] Edema Control - Lymphedema / SCD / Other: Elevate legs to the level of the heart or above for 30 minutes daily and/or when sitting, a frequency of: Avoid standing for long periods of time. Exercise regularly - walking daily Moisturize legs daily. - right leg Consults ordered were: Vascular - formal arterial doppler and formal ABI/TBI The following medication(s) was prescribed: lidocaine topical 5 % ointment ointment topical for prior to debridement was prescribed at facility WOUND #1: - Lower Leg Wound Laterality: Left, Posterior Cleanser: Soap and Water 1 x Per Week/30 Days Discharge Instructions: May shower and wash wound with dial antibacterial soap and water prior to dressing change. Peri-Wound  Care: Triamcinolone 15 (g) 1 x Per Week/30 Days Discharge Instructions: Use triamcinolone 15 (g) as directed Peri-Wound Care: Sween Lotion (Moisturizing lotion) 1 x Per Week/30 Days Discharge Instructions: Apply moisturizing lotion as directed Prim Dressing: IODOFLEX 0.9% Cadexomer Iodine Pad 4x6 cm 1 x Per Week/30 Days ary Discharge Instructions: Apply to wound bed as instructed Secondary Dressing: ABD Pad, 8x10 1 x Per Week/30 Days Discharge Instructions: Apply over primary dressing as directed. Secondary Dressing: Woven Gauze Sponge, Non-Sterile 4x4 in 1 x Per Week/30 Days Discharge Instructions: Apply over primary dressing as directed. Secured With: Coban Self-Adherent Wrap 4x5 (in/yd) 1 x Per Week/30 Days Discharge Instructions: Secure with Coban as directed. Secured With: [employer], 4.5x3.1 (in/yd) 1 x Per Week/30 Days Discharge Instructions: Secure with Kerlix as directed. WOUND #2: - Lower Leg Wound Laterality: Left, Lateral Cleanser: Soap and Water 1 x Per Week/30 Days Discharge Instructions:  May shower and wash wound with dial antibacterial soap and water prior to dressing change. Peri-Wound Care: Triamcinolone 15 (g) 1 x Per Week/30 Days Discharge Instructions: Use triamcinolone 15 (g) as directed Peri-Wound Care: Sween Lotion (Moisturizing lotion) 1 x Per Week/30 Days Discharge Instructions: Apply moisturizing lotion as directed Prim Dressing: IODOFLEX 0.9% Cadexomer Iodine Pad 4x6 cm 1 x Per Week/30 Days ary Discharge Instructions: Apply to wound bed as instructed Secondary Dressing: ABD Pad, 8x10 1 x Per Week/30 Days Discharge Instructions: Apply over primary dressing as directed. Secondary Dressing: Woven Gauze Sponge, Non-Sterile 4x4 in 1 x Per Week/30 Days Discharge Instructions: Apply over primary dressing as directed. Secured With: Coban Self-Adherent Wrap 4x5 (in/yd) 1 x Per Week/30 Days Discharge Instructions: Secure with Coban as directed. Secured With:  [employer], 4.5x3.1 (in/yd) 1 x Per Week/30 Days Discharge Instructions: Secure with Kerlix as directed. 1. I suspect this is going to turn out to be chronic venous hypertension and stasis dermatitis with complicating ulcers. 2. Her ABI was only 0.63 I had difficulty feeling the pulses in her feet certainly dictates arterial evaluation and I have referred her to vein and vascular 3. I think we probably should look at her venous status with venous reflux studies while she is getting her arterial studies 4. The pyoderma from years ago adds an interesting side like to this story although I am doubtful that is what is going on in the posterior left calf but I cannot rule it out entirely. Pyoderma is known to recur. If this rapidly expands deteriorates in characteristics she will require a biopsy and then in itself will not be easy 5. I am not sure what she has been putting on the wound I actually asked her to bring it in next week 6. We put Iodosorb, ABDs under kerlix Coban. Liberal TCA to the surrounding inflammation #7 the patient has chronic diastolic heart failure I did not pick up on any of this on exam. She has a systolic murmur likely her ASH but no evidence of heart failure. 8. She was originally treated as an outpatient and in hospital for cellulitis I do not see any of that currently.I do not see any need for antibiotics at the present Electronic Signature(s) Signed: 11/04/2021 4:29:29 PM By: Christine Ham MD Entered By: Christine Jacobson on 11/04/2021 09:52:13 Christine Jacobson, Christine Jacobson ([id number]) [id number] Page 8 of 10 -------------------------------------------------------------------------------- HxROS Details Patient Name: Date of Service: Christine Jacobson, Christine Jacobson 11/04/2021 8:00 A M Medical Record Number: [id number] Patient Account Number: [id number] Date of Birth/Sex: Treating RN: 11-04-1955 (66 y.o. Female) Christine Jacobson Primary Care Provider: PA Haig Jacobson, [place] Other  Clinician: Referring Provider: Treating Provider/Extender: Christine Jacobson in Treatment: 0 Information Obtained From Patient Constitutional Symptoms (General Health) Complaints and Symptoms: Positive for: Chills Negative for: Fever Eyes Complaints and Symptoms: Positive for: Glasses / Contacts Ear/Nose/Mouth/Throat Complaints and Symptoms: Negative for: Chronic sinus problems or rhinitis Respiratory Complaints and Symptoms: Positive for: Shortness of Breath Gastrointestinal Complaints and Symptoms: Positive for: Vomiting Negative for: Frequent diarrhea; Nausea Review of System Notes: minor vomiting possibly related to medicaiton changes, pcp treating Endocrine Complaints and Symptoms: Negative for: Heat/cold intolerance Genitourinary Complaints and Symptoms: Negative for: Frequent urination Integumentary (Skin) Complaints and Symptoms: Positive for: Wounds - left lower leg Musculoskeletal Complaints and Symptoms: Negative for: Muscle Pain; Muscle Weakness Neurologic Complaints and Symptoms: Negative for: Numbness/parasthesias Hematologic/Lymphatic Medical History: Positive for: Lymphedema Christine Jacobson, Christine Jacobson ([id number]) [id number] Page 9 of 10 Cardiovascular Medical History: Positive for: Congestive  Heart Failure; Hypertension Past Medical History Notes: cardiomyopathy hyperlipidemia Immunological Oncologic Psychiatric Medical History: Past Medical History Notes: depression/anxiety Immunizations Pneumococcal Vaccine: Received Pneumococcal Vaccination: No Implantable Devices None Hospitalization / Surgery History Type of Hospitalization/Surgery heart cath 10/15/21 Family and Social History Cancer: Yes - Mother; Diabetes: Yes - Maternal Grandparents; Hypertension: Yes - Father; Lung Disease: Yes - Father; Stroke: Yes - Maternal Grandparents; Former smoker - quit 20+ years ago; Marital Status - Single; Alcohol  Use: Never; Drug Use: No History; Caffeine Use: Never; Financial Concerns: No; Food, Clothing or Shelter Needs: No; Support System Lacking: Yes - lives alone; Transportation Concerns: No Electronic Signature(s) Signed: 11/04/2021 4:29:29 PM By: Christine Ham MD Signed: 11/04/2021 5:03:48 PM By: Christine Creamer RN, BSN Entered By: Christine Jacobson on 11/04/2021 08:36:50 -------------------------------------------------------------------------------- [facility] Details Patient Name: Date of Service: Christine Jacobson, ARENDALL Jacobson 11/04/2021 Medical Record Number: [id number] Patient Account Number: [id number] Date of Birth/Sex: Treating RN: 1955/09/04 (66 y.o. 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 L03.116 Cellulitis of left lower limb Facility Procedures : CPT4 Code: [id number] Description: 99214 - WOUND CARE VISIT-LEV 4 EST PT Modifier: Quantity: 1 Physician Procedures : CPT4 Code Description Modifier [id number] - WC PHYS LEVEL 4 - NEW PT Kwiatek, Aliegha ([id number]) [id number] ICD-10 Diagnosis Description I87.332 Chronic venous hypertension (idiopathic) with ulcer and inflammation of left lower  extremity L97.228 Non-pressure chronic ulcer of left calf with other specified severity L03.116 Cellulitis of left lower limb Quantity: 1 27.pdf Page 10 of 10 Electronic Signature(s) Signed: 11/04/2021 4:29:29 PM By: Christine Ham MD Signed: 11/04/2021 5:03:48 PM By: Christine Creamer RN, BSN Entered By: Christine Jacobson on 11/04/2021 09:58:36

## 2021-11-04 NOTE — Progress Notes (Signed)
ONETHA, GAFFEY ([id number]) [id number] Page 1 of 11 Visit Report for 11/04/2021 Allergy List Details Patient Name: Date of Service: Christine Jacobson, Christine Jacobson 11/04/2021 8:00 A M Medical Record Number: [id number] Patient Account Number: [id number] Date of Birth/Sex: Treating RN: [birth year] (66 y.o. Female) Redmond Pulling Primary Care Reilyn Nelson: PA Zenovia Jordan, [place] Other Clinician: Referring Londynn Sonoda: Treating Jazier Mcglamery/Extender: Ephriam Jenkins in Treatment: 0 Allergies Active Allergies No Known Allergies Allergy Notes Electronic Signature(s) Signed: 11/04/2021 5:03:48 PM By: Redmond Pulling RN, BSN Entered By: Redmond Pulling on 11/04/2021 08:27:54 -------------------------------------------------------------------------------- Arrival Information Details Patient Name: Date of Service: Christine Jacobson, Christine Jacobson 11/04/2021 8:00 A M Medical Record Number: [id number] Patient Account Number: [id number] Date of Birth/Sex: Treating RN: 07-Aug-1955 (66 y.o. Female) Redmond Pulling Primary Care Kaylan Yates: PA Zenovia Jordan, [place] Other Clinician: Referring Thaniel Coluccio: Treating Jezreel Sisk/Extender: Ephriam Jenkins in Treatment: 0 Visit Information Patient Arrived: Ambulatory Arrival Time: 08:08 Accompanied By: self Transfer Assistance: None Patient Identification Verified: Yes Secondary Verification Process Completed: Yes Electronic Signature(s) Signed: 11/04/2021 5:03:48 PM By: Redmond Pulling RN, BSN Entered By: Redmond Pulling on 11/04/2021 08:19:04 -------------------------------------------------------------------------------- Clinic Level of Care Assessment Details Patient Name: Date of Service: Christine Jacobson, Christine Jacobson 11/04/2021 8:00 A M Medical Record Number: [id number] Patient Account Number: [id number] Date of Birth/Sex: Treating RN: 04-11-1955 (66 y.o. Female) Redmond Pulling Primary Care Vickee Mormino: PA Fidela Juneau Other Clinician: LINDSAY, STRAKA ([id number])  [id number] Page 2 of 11 Referring Angelo Prindle: Treating Ashaun Gaughan/Extender: Ephriam Jenkins in Treatment: 0 Clinic Level of Care Assessment Items TOOL 2 Quantity Score X- 1 0 Use when only an EandM is performed on the INITIAL visit ASSESSMENTS - Nursing Assessment / Reassessment X- 1 20 General Physical Exam (combine w/ comprehensive assessment (listed just below) when performed on new pt. evals) X- 1 25 Comprehensive Assessment (HX, ROS, Risk Assessments, Wounds Hx, etc.) ASSESSMENTS - Wound and Skin A ssessment / Reassessment X - Simple Wound Assessment / Reassessment - one wound 1 5 []  - 0 Complex Wound Assessment / Reassessment - multiple wounds []  - 0 Dermatologic / Skin Assessment (not related to wound area) ASSESSMENTS - Ostomy and/or Continence Assessment and Care []  - 0 Incontinence Assessment and Management []  - 0 Ostomy Care Assessment and Management (repouching, etc.) PROCESS - Coordination of Care X - Simple Patient / Family Education for ongoing care 1 15 []  - 0 Complex (extensive) Patient / Family Education for ongoing care X- 1 10 Staff obtains , Records, T Results / Process Orders est []  - 0 Staff telephones HHA, Nursing Homes / Clarify orders / etc []  - 0 Routine Transfer to another Facility (non-emergent condition) []  - 0 Routine Hospital Admission (non-emergent condition) X- 1 15 New Admissions / / Ordering NPWT Apligraf, etc. , []  - 0 Emergency Hospital Admission (emergent condition) []  - 0 Simple Discharge Coordination []  - 0 Complex (extensive) Discharge Coordination PROCESS - Special Needs []  - 0 Pediatric / Minor Patient Management []  - 0 Isolation Patient Management []  - 0 Hearing / Language / Visual special needs []  - 0 Assessment of Community assistance (transportation, D/C planning, etc.) []  - 0 Additional assistance / Altered mentation []  - 0 Support  Surface(s) Assessment (bed, cushion, seat, etc.) INTERVENTIONS - Wound Cleansing / Measurement X- 1 5 Wound Imaging (photographs - any number of wounds) []  - 0 Wound Tracing (instead of photographs) []  - 0 Simple Wound Measurement - one wound X- 2 5 Complex Wound Measurement - multiple wounds []  - 0 Simple Wound Cleansing -  one wound X- 2 5 Complex Wound Cleansing - multiple wounds INTERVENTIONS - Wound Dressings []  - 0 Small Wound Dressing one or multiple wounds X- 1 15 Medium Wound Dressing one or multiple wounds []  - 0 Large Wound Dressing one or multiple wounds []  - 0 Application of Medications - injection INTERVENTIONS - Miscellaneous Joe, Shauntel ([id number]) [id number] Page 3 of 11 []  - 0 External ear exam []  - 0 Specimen Collection (cultures, biopsies, blood, body fluids, etc.) []  - 0 Specimen(s) / Culture(s) sent or taken to Lab for analysis []  - 0 Patient Transfer (multiple staff / Harrel Lemon Lift / Similar devices) []  - 0 Simple Staple / Suture removal (25 or less) []  - 0 Complex Staple / Suture removal (26 or more) []  - 0 Hypo / Hyperglycemic Management (close monitor of Blood Glucose) X- 1 15 Ankle / Brachial Index (ABI) - do not check if billed separately Has the patient been seen at the hospital within the last three years: Yes Total Score: 145 Level Of Care: New/Established - Level 4 Electronic Signature(s) Signed: 11/04/2021 5:03:48 PM By: Sharyn Creamer RN, BSN Entered By: Sharyn Creamer on 11/04/2021 09:58:18 -------------------------------------------------------------------------------- Encounter Discharge Information Details Patient Name: Date of Service: Christine Jacobson, Christine Jacobson 11/04/2021 8:00 [place] Record Number: [id number] Patient Account Number: [id number] Date of Birth/Sex: Treating RN: 09-11-1955 (66 y.o. Female) Sharyn Creamer Primary Care Lipa Knauff: PA Haig Prophet, [place] Other Clinician: Referring Sevrin Sally: Treating  Canaan Prue/Extender: [name] in Treatment: 0 Encounter Discharge Information Items Discharge Condition: Stable Ambulatory Status: Ambulatory Discharge Destination: Home Transportation: Private Auto Accompanied By: self Schedule Follow-up Appointment: Yes Clinical Summary of Care: Patient Declined Electronic Signature(s) Signed: 11/04/2021 5:03:48 PM By: Sharyn Creamer RN, BSN Entered By: Sharyn Creamer on 11/04/2021 09:59:23 -------------------------------------------------------------------------------- Lower Extremity Assessment Details Patient Name: Date of Service: Christine Jacobson, Christine Jacobson 11/04/2021 8:00 A M Medical Record Number: [id number] Patient Account Number: [id number] Date of Birth/Sex: Treating RN: 1955/02/22 (66 y.o. Female) Sharyn Creamer Primary Care Indiyah Paone: PA Haig Prophet, [place] Other Clinician: Referring Samariya Rockhold: Treating Jaecob Lowden/Extender: [name] in Treatment: 0 Edema Assessment Assessed: [Left: No] [Right: No] [Left: Edema] [Right: :] H[Left: ERBST, Vyla ([id number])] [Right: [id number] Page 4 of 11] Calf Left: Right: Point of Measurement: 32 cm From Medial Instep 41 cm Ankle Left: Right: Point of Measurement: 10 cm From Medial Instep 28.7 cm Knee To Floor Left: Right: From Medial Instep 39 cm Vascular Assessment Pulses: Dorsalis Pedis Palpable: [Left:Yes] Blood Pressure: Brachial: [Left:111] Ankle: [Left:Dorsalis Pedis: 70 0.63] Electronic Signature(s) Signed: 11/04/2021 5:03:48 PM By: Sharyn Creamer RN, BSN Entered By: Sharyn Creamer on 11/04/2021 08:45:49 -------------------------------------------------------------------------------- Multi Wound Chart Details Patient Name: Date of Service: Christine Jacobson, Christine Jacobson 11/04/2021 8:00 A M Medical Record Number: [id number] Patient Account Number: [id number] Date of Birth/Sex: Treating RN: 06/23/1955 (66 y.o. Female) Primary Care  Sage Hammill: PA [place], [place] Other Clinician: Referring Noelia Lenart: Treating Lyzbeth Genrich/Extender: [name] in Treatment: 0 Vital Signs Height(in): 64 Pulse(bpm): 9 Weight(lbs): 69 Blood Pressure(mmHg): 111/73 Body Mass Index(BMI): 34.8 Temperature(F): 97.7 Respiratory Rate(breaths/min): 18 [1:Photos:] [N/A:N/A] Left, Posterior Lower Leg Left, Lateral Lower Leg N/A Wound Location: Gradually Appeared Gradually Appeared N/A Wounding Event: Venous Leg Ulcer Venous Leg Ulcer N/A Primary Etiology: Lymphedema, Congestive Heart Lymphedema, Congestive Heart N/A Comorbid History: Failure, Hypertension Failure, Hypertension 08/17/2021 08/17/2021 N/A Date Acquired: 0 0 N/A Weeks of Treatment: Open Open N/A Wound Status: No No N/A Wound Recurrence: 9x7x0.2 4x3.7x0.1 N/A Measurements L x W x D (cm) 49.48 11.624 N/A  A (cm) : rea 9.896 1.162 N/A Volume (cm) : Tilly, Raushanah ([id number]) [id number] Page 5 of 11 Full Thickness Without Exposed Full Thickness Without Exposed N/A Classification: Support Structures Support Structures Medium Medium N/A Exudate A mount: Serosanguineous Serosanguineous N/A Exudate Type: red, brown red, brown N/A Exudate Color: None Present (0%) Small (1-33%) N/A Granulation Amount: N/A Red N/A Granulation Quality: Large (67-100%) Large (67-100%) N/A Necrotic Amount: Adherent Slough Eschar, Adherent Slough N/A Necrotic Tissue: Fat Layer (Subcutaneous Tissue): Yes Fat Layer (Subcutaneous Tissue): Yes N/A Exposed Structures: Fascia: No Fascia: No Tendon: No Tendon: No Muscle: No Muscle: No Joint: No Joint: No Bone: No Bone: No None None N/A Epithelialization: Dry/Scaly: Yes Dry/Scaly: Yes N/A Periwound Skin Moisture: Erythema: Yes Erythema: Yes N/A Periwound Skin Color: Circumferential Circumferential N/A Erythema Location: No Abnormality No Abnormality N/A Temperature: Yes Yes  N/A Tenderness on Palpation: Treatment Notes Electronic Signature(s) Signed: 11/04/2021 4:29:29 PM By: Baltazar Najjarobson, Michael MD Entered By: Baltazar Najjarobson, Michael on 11/04/2021 09:41:14 -------------------------------------------------------------------------------- Multi-Disciplinary Care Plan Details Patient Name: Date of Service: Christine Jacobson, Christine Jacobson 11/04/2021 8:00 A M Medical Record Number: [id number] Patient Account Number: [id number] Date of Birth/Sex: Treating RN: 01-18-1955 (66 y.o. Female) Redmond PullingPalmer, Carrie Primary Care Devon Kingdon: PA Zenovia [place], [place] Other Clinician: Referring Zackory Pudlo: Treating Alexzandrea Normington/Extender: Ephriam Jenkinsobson, Michael Comer, Robert Weeks in Treatment: 0 Active Inactive Orientation to the Wound Care Program Nursing Diagnoses: Knowledge deficit related to the wound healing center program Goals: Patient/caregiver will verbalize understanding of the Wound Healing Center Program Date Initiated: 11/04/2021 Target Resolution Date: 11/19/2021 Goal Status: Active Interventions: Provide education on orientation to the wound center Notes: Venous Leg Ulcer Nursing Diagnoses: Knowledge deficit related to disease process and management Goals: Patient will maintain optimal edema control Date Initiated: 11/04/2021 Target Resolution Date: 12/02/2021 Goal Status: Active Patient/caregiver will verbalize understanding of disease process and disease management Date Initiated: 11/04/2021 Target Resolution Date: 12/16/2021 Goal Status: Active Verify adequate tissue perfusion prior to therapeutic compression application Date Initiated: 11/04/2021 Target Resolution Date: 12/23/2021 Seward CarolHERBST, Dao ([id number]) [id number] Page 6 of 11 Goal Status: Active Interventions: Assess peripheral edema status every visit. Compression as ordered Provide education on venous insufficiency Notes: Wound/Skin Impairment Nursing Diagnoses: Impaired tissue integrity Knowledge deficit related  to ulceration/compromised skin integrity Goals: Patient/caregiver will verbalize understanding of skin care regimen Date Initiated: 11/04/2021 Target Resolution Date: 12/09/2021 Goal Status: Active Ulcer/skin breakdown will have a volume reduction of 30% by week 4 Date Initiated: 11/04/2021 Target Resolution Date: 12/02/2021 Goal Status: Active Interventions: Assess patient/caregiver ability to obtain necessary supplies Assess patient/caregiver ability to perform ulcer/skin care regimen upon admission and as needed Assess ulceration(s) every visit Provide education on ulcer and skin care Notes: Electronic Signature(s) Signed: 11/04/2021 5:03:48 PM By: Redmond PullingPalmer, Carrie RN, BSN Entered By: Redmond PullingPalmer, Carrie on 11/04/2021 09:01:18 -------------------------------------------------------------------------------- Pain Assessment Details Patient Name: Date of Service: Christine Jacobson, Christine Jacobson 11/04/2021 8:00 A M Medical Record Number: [id number] Patient Account Number: [id number] Date of Birth/Sex: Treating RN: 01-18-1955 (66 y.o. Female) Redmond PullingPalmer, Carrie Primary Care Lyrical Sowle: PA Zenovia [place], [place] Other Clinician: Referring Lindberg Zenon: Treating Randale Carvalho/Extender: Ephriam Jenkinsobson, Michael Comer, Robert Weeks in Treatment: 0 Active Problems Location of Pain Severity and Description of Pain Patient Has Paino Yes Site Locations Rate the pain. Current Pain Level: 2 Worst Pain Level: 7 Havlin, Allene ([id number]) [id number] Page 7 of 11 Pain Management and Medication Current Pain Management: Electronic Signature(s) Signed: 11/04/2021 5:03:48 PM By: Redmond PullingPalmer, Carrie RN, BSN Entered By: Redmond PullingPalmer, Carrie on 11/04/2021 08:52:23 -------------------------------------------------------------------------------- Patient/Caregiver Education Details Patient Name: Date of Service: Christine Jacobson, Christine Jacobson 10/19/2023andnbsp8:00 A M Medical Record Number: [id number] Patient Account Number: [id number] Date of  Birth/Gender: Treating RN: 08-20-1955 (66 y.o. Female) Redmond Pulling Primary Care Physician: PA Zenovia Jordan, [place] Other Clinician: Referring Physician: Treating Physician/Extender: Ephriam Jenkins in Treatment: 0 Education Assessment Education Provided To: Patient Education Topics Provided Venous: Handouts: Controlling Swelling with Multilayered Compression Wraps, Managing Venous Disease and Related Ulcers Methods: Explain/Verbal, Printed Responses: State content correctly Welcome T [facility]: o Handouts: Welcome T [facility] o Methods: Explain/Verbal, Printed Responses: State content correctly Wound/Skin Impairment: Handouts: Caring for Your Ulcer, Skin Care Do's and Dont's Methods: Explain/Verbal, Printed Responses: State content correctly Electronic Signature(s) Signed: 11/04/2021 5:03:48 PM By: Redmond Pulling RN, BSN Entered By: Redmond Pulling on 11/04/2021 09:02:34 -------------------------------------------------------------------------------- Wound Assessment Details Patient Name: Date of Service: Christine Jacobson, Christine Jacobson 11/04/2021 8:00 A M Medical Record Number: [id number] Patient Account Number: [id number] Date of Birth/Sex: Treating RN: Nov 15, 1955 (66 y.o. Female) Redmond Pulling Primary Care Bethanny Toelle: PA Zenovia Jordan, [place] Other Clinician: Referring [employer]aya Mcjunkins: Treating Razan Siler/Extender: Ephriam Jenkins in Treatment: 0 Wound Status Wound Number: 1 Primary Etiology: Venous Leg Ulcer Wound Location: Left, Posterior Lower Leg Wound Status: Open Wounding Event: Gradually Appeared Comorbid History: Lymphedema, Congestive Heart Failure, Hypertension Corum, April ([id number]) [id number] Page 8 of 11 Date Acquired: 08/17/2021 Weeks Of Treatment: 0 Clustered Wound: No Photos Wound Measurements Length: (cm) 9 Width: (cm) 7 Depth: (cm) 0.2 Area: (cm) 49.48 Volume: (cm) 9.896 % Reduction in Area: %  Reduction in Volume: Epithelialization: None Tunneling: No Undermining: No Wound Description Classification: Full Thickness Without Exposed Support Structures Exudate Amount: Medium Exudate Type: Serosanguineous Exudate Color: red, brown Foul Odor After Cleansing: No Slough/Fibrino Yes Wound Bed Granulation Amount: None Present (0%) Exposed Structure Necrotic Amount: Large (67-100%) Fascia Exposed: No Necrotic Quality: Adherent Slough Fat Layer (Subcutaneous Tissue) Exposed: Yes Tendon Exposed: No Muscle Exposed: No Joint Exposed: No Bone Exposed: No Periwound Skin Texture Texture Color No Abnormalities Noted: No No Abnormalities Noted: No Erythema: Yes Moisture Erythema Location: Circumferential No Abnormalities Noted: No Dry / Scaly: Yes Temperature / Pain Temperature: No Abnormality Tenderness on Palpation: Yes Treatment Notes Wound #1 (Lower Leg) Wound Laterality: Left, Posterior Cleanser Soap and Water Discharge Instruction: May shower and wash wound with dial antibacterial soap and water prior to dressing change. Peri-Wound Care Triamcinolone 15 (g) Discharge Instruction: Use triamcinolone 15 (g) as directed Sween Lotion (Moisturizing lotion) Discharge Instruction: Apply moisturizing lotion as directed Topical Primary Dressing IODOFLEX 0.9% Cadexomer Iodine Pad 4x6 cm Discharge Instruction: Apply to wound bed as instructed Secondary Dressing ABD Pad, 8x10 Discharge Instruction: Apply over primary dressing as directed. ANSLIE, SPADAFORA ([id number]) [id number] Page 9 of 11 Woven Gauze Sponge, Non-Sterile 4x4 in Discharge Instruction: Apply over primary dressing as directed. Secured With [employer] 4x5 (in/yd) Discharge Instruction: Secure with Coban as directed. Kerlix Roll Sterile, 4.5x3.1 (in/yd) Discharge Instruction: Secure with Kerlix as directed. Compression Wrap Compression Stockings Add-Ons Electronic  Signature(s) Signed: 11/04/2021 5:03:48 PM By: Redmond Pulling RN, BSN Entered By: Redmond Pulling on 11/04/2021 08:51:04 -------------------------------------------------------------------------------- Wound Assessment Details Patient Name: Date of Service: Christine Jacobson, Christine Jacobson 11/04/2021 8:00 A M Medical Record Number: [id number] Patient Account Number: [id number] Date of Birth/Sex: Treating RN: 1955/06/01 (66 y.o. Female) Redmond Pulling Primary Care Marceline Napierala: PA Zenovia Jordan, [place] Other Clinician: Referring Samar Dass: Treating Erion Weightman/Extender: Ephriam Jenkins in Treatment: 0 Wound Status Wound Number: 2 Primary Etiology: Venous Leg Ulcer Wound Location: Left, Lateral Lower Leg Wound  Status: Open Wounding Event: Gradually Appeared Comorbid History: Lymphedema, Congestive Heart Failure, Hypertension Date Acquired: 08/17/2021 Weeks Of Treatment: 0 Clustered Wound: No Photos Wound Measurements Length: (cm) 4 Width: (cm) 3.7 Depth: (cm) 0.1 Area: (cm) 11.624 Volume: (cm) 1.162 % Reduction in Area: % Reduction in Volume: Epithelialization: None Tunneling: No Undermining: No Wound Description Classification: Full Thickness Without Exposed Suppor Exudate Amount: Medium Exudate Type: Serosanguineous Exudate Color: red, brown t Structures Foul Odor After Cleansing: No Slough/Fibrino Yes Wound Bed Granulation Amount: Small (1-33%) Exposed Structure Granulation Quality: Red Fascia Exposed: No Papp, Donna ([id number]) [id number] Page 10 of 11 Necrotic Amount: Large (67-100%) Fat Layer (Subcutaneous Tissue) Exposed: Yes Necrotic Quality: Eschar, Adherent Slough Tendon Exposed: No Muscle Exposed: No Joint Exposed: No Bone Exposed: No Periwound Skin Texture Texture Color No Abnormalities Noted: No No Abnormalities Noted: No Erythema: Yes Moisture Erythema Location: Circumferential No Abnormalities Noted: No Dry / Scaly: Yes  Temperature / Pain Temperature: No Abnormality Tenderness on Palpation: Yes Treatment Notes Wound #2 (Lower Leg) Wound Laterality: Left, Lateral Cleanser Soap and Water Discharge Instruction: May shower and wash wound with dial antibacterial soap and water prior to dressing change. Peri-Wound Care Triamcinolone 15 (g) Discharge Instruction: Use triamcinolone 15 (g) as directed Sween Lotion (Moisturizing lotion) Discharge Instruction: Apply moisturizing lotion as directed Topical Primary Dressing IODOFLEX 0.9% Cadexomer Iodine Pad 4x6 cm Discharge Instruction: Apply to wound bed as instructed Secondary Dressing ABD Pad, 8x10 Discharge Instruction: Apply over primary dressing as directed. Woven Gauze Sponge, Non-Sterile 4x4 in Discharge Instruction: Apply over primary dressing as directed. Secured With [employer] 4x5 (in/yd) Discharge Instruction: Secure with Coban as directed. Kerlix Roll Sterile, 4.5x3.1 (in/yd) Discharge Instruction: Secure with Kerlix as directed. Compression Wrap Compression Stockings Add-Ons Electronic Signature(s) Signed: 11/04/2021 5:03:48 PM By: Redmond Pulling RN, BSN Entered By: Redmond Pulling on 11/04/2021 08:51:29 -------------------------------------------------------------------------------- Vitals Details Patient Name: Date of Service: Chisenhall, Christine Jacobson 11/04/2021 8:00 A M Medical Record Number: [id number] Patient Account Number: [id number] Date of Birth/Sex: Treating RN: [birth year] (66 y.o. Female) Redmond Pulling Primary Care Naimah Yingst: PA Zenovia Jordan, [place] Other Clinician: Referring Mirian Casco: Treating Arnelle Nale/Extender: Ephriam Jenkins in Treatment: 0 Eubank, Darcee ([id number]) [id number] Page 11 of 11 Vital Signs Time Taken: 08:26 Temperature (F): 97.7 Height (in): 64 Pulse (bpm): 62 Source: Stated Respiratory Rate (breaths/min): 18 Weight (lbs): 203 Blood Pressure (mmHg):  111/73 Source: Stated Reference Range: 80 - 120 mg / dl Body Mass Index (BMI): 34.8 Electronic Signature(s) Signed: 11/04/2021 5:03:48 PM By: Redmond Pulling RN, BSN Entered By: Redmond Pulling on 11/04/2021 08:27:47

## 2021-11-07 ENCOUNTER — Other Ambulatory Visit: Payer: Self-pay | Admitting: Cardiology

## 2021-11-07 DIAGNOSIS — I5031 Acute diastolic (congestive) heart failure: Secondary | ICD-10-CM

## 2021-11-11 ENCOUNTER — Encounter (HOSPITAL_BASED_OUTPATIENT_CLINIC_OR_DEPARTMENT_OTHER): Payer: Medicare Other | Admitting: General Surgery

## 2021-11-11 DIAGNOSIS — I87332 Chronic venous hypertension (idiopathic) with ulcer and inflammation of left lower extremity: Secondary | ICD-10-CM | POA: Diagnosis not present

## 2021-11-11 NOTE — Progress Notes (Signed)
ESSYNCE, OZIER ([id number]) [id number] Page 1 of 8 Visit Report for 11/11/2021 Chief Complaint Document Details Patient Name: Date of Service: Christine Jacobson, Christine Jacobson 11/11/2021 9:15 A M Medical Record Number: [id number] Patient Account Number: [id number] Date of Birth/Sex: Treating RN: 09/08/1955 (66 y.o. Christine Jacobson Primary Care Provider: PA Haig Prophet, [place] Other Clinician: Referring Provider: Treating Provider/Extender: Elizebeth Koller in Treatment: 1 Information Obtained from: Patient Chief Complaint 11/04/2021; patient is here for review of wounds on the left lower leg referred by Dr. Linus Salmons Electronic Signature(s) Signed: 11/11/2021 9:55:34 AM By: Fredirick Maudlin MD FACS Entered By: Fredirick Maudlin on 11/11/2021 09:55:34 -------------------------------------------------------------------------------- Debridement Details Patient Name: Date of Service: Christine Jacobson, Christine Jacobson 11/11/2021 9:15 A M Medical Record Number: [id number] Patient Account Number: [id number] Date of Birth/Sex: Treating RN: Jul 10, 1955 (66 y.o. Christine Jacobson Primary Care Provider: PA Haig Prophet, [place] Other Clinician: Referring Provider: Treating Provider/Extender: Elizebeth Koller in Treatment: 1 Debridement Performed for Assessment: Wound #1 Left,Posterior Lower Leg Performed By: Physician Fredirick Maudlin, MD Debridement Type: Debridement Severity of Tissue Pre Debridement: Fat layer exposed Level of Consciousness (Pre-procedure): Awake and Alert Pre-procedure Verification/Time Out Yes - 09:47 Taken: Start Time: 09:48 Pain Control: Lidocaine 5% topical ointment T Area Debrided (L x W): otal 8.4 (cm) x 4.6 (cm) = 38.64 (cm) Tissue and other material debrided: Non-Viable, Slough, Slough Level: Non-Viable Tissue Debridement Description: Selective/Open Wound Instrument: Curette Specimen: Tissue Culture Number of Specimens T aken: 1 Bleeding:  Minimum Hemostasis Achieved: Pressure Procedural Pain: 5 Post Procedural Pain: 0 Response to Treatment: Procedure was tolerated well Level of Consciousness (Post- Awake and Alert procedure): Post Debridement Measurements of Total Wound Length: (cm) 8.4 Width: (cm) 4.6 Depth: (cm) 0.1 Volume: (cm) 3.035 Character of Wound/Ulcer Post Debridement: Requires Further Debridement Jacobson, Christine ([id number]) [id number] Page 2 of 8 Severity of Tissue Post Debridement: Fat layer exposed Post Procedure Diagnosis Same as Pre-procedure Notes Scribed for Dr. Celine Ahr by Blanche East, RN Electronic Signature(s) Signed: 11/11/2021 10:39:50 AM By: Fredirick Maudlin MD FACS Signed: 11/11/2021 11:57:43 AM By: Worthy Rancher Signed: 11/11/2021 4:24:03 PM By: Adline Peals Previous Signature: 11/11/2021 9:56:40 AM Version By: Worthy Rancher Entered By: Worthy Rancher on 11/11/2021 10:28:58 -------------------------------------------------------------------------------- HPI Details Patient Name: Date of Service: Christine [name] 11/11/2021 9:15 A M Medical Record Number: [id number] Patient Account Number: [id number] Date of Birth/Sex: Treating RN: 09/29/1955 (66 y.o. Christine Jacobson Primary Care Provider: PA Haig Prophet, [place] Other Clinician: Referring Provider: Treating Provider/Extender: Elizebeth Koller in Treatment: 1 History of Present Illness HPI Description: ADMISSION 11/04/2021 This is a 66 year old woman who comes into the clinic for review of wounds on the left leg. This includes several small areas on the left lateral lower leg as well as a substantial area on the left posterior calf. The patient was referred by Dr. Linus Salmons of infectious disease. She says that this began in the end of September when she developed lower extremity swelling and pain. She was seen in urgent care and put on Dalbvancin. However she required admission from 9/21 through 9/27  and was treated with ceftriaxone outpatient cefadroxil. A CT scan of the leg was negative for abscess. She was referred as an outpatient to Dr. Arelia Longest who saw her on October 10 did not feel she had any additional infectious issues and referred her here. 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 She has been using a cream she had on the wounds on her left leg left over from that timeframe. Past medical history includes recent diagnosis of chronic diastolic heart failure, paroxysmal A-fib on Eliquis, HOCM/ASH, hypertension and a history of pyoderma that is a self-report. ABI in this clinic was 0.63. The patient is a non-smoker she is not a known diabetic 11/11/2021: The patient did not go for her vascular studies. She says she did not understand what she was being called about. I spent some time explaining to her the value of those tests for her wound healing. Today, the 2 wounds have merged into 1. There is some slough on the wound surface and it remains extremely tender. No malodor or purulent drainage. Electronic Signature(s) Signed: 11/11/2021 9:56:38 AM By: Fredirick Maudlin MD FACS Entered By: Fredirick Maudlin on 11/11/2021 09:56:37 -------------------------------------------------------------------------------- Physical Exam Details Patient Name: Date of Service: Christine Jacobson, Christine Jacobson 11/11/2021 9:15 A M Medical Record Number: [id number] Patient Account Number: [id number] Date of Birth/Sex: Treating RN: February 07, 1955 (66 y.o. Christine Jacobson Primary Care Provider: PA Haig Prophet, [place] Other Clinician: Referring Provider: Treating Provider/Extender: Elizebeth Koller in Treatment: Jacobson, Christine ([id number]) [id number] Page 3 of 8 Constitutional Hypertensive, asymptomatic.  Bradycardic, asymptomatic.. . . No acute distress.. Notes 11/11/2021: Today, the 2 wounds have merged into 1. There is some slough on the wound surface and it remains extremely tender. No malodor or purulent drainage. Electronic Signature(s) Signed: 11/11/2021 9:57:42 AM By: Fredirick Maudlin MD FACS Entered By: Fredirick Maudlin on 11/11/2021 09:57:42 -------------------------------------------------------------------------------- Physician Orders Details Patient Name: Date of Service: Christine Jacobson, Christine Jacobson 11/11/2021 9:15 A M Medical Record Number: [id number] Patient Account Number: [id number] Date of Birth/Sex: Treating RN: 1955/10/19 (66 y.o. Christine Jacobson Primary Care Provider: PA Haig Prophet, [place] Other Clinician: Referring Provider: Treating Provider/Extender: Elizebeth Koller in Treatment: 1 Verbal / Phone Orders: No Diagnosis Coding ICD-10 Coding Code Description [id number] Chronic venous hypertension (idiopathic) with ulcer and inflammation of left lower extremity L97.228 Non-pressure chronic ulcer of left calf with other specified severity L03.116 Cellulitis of left lower limb Follow-up Appointments ppointment in 1 week. - Dr. Celine Ahr - Room 3 Return A Anesthetic Wound #1 Left,Posterior Lower Leg (In clinic) Topical Lidocaine 5% applied to wound bed Bathing/ Shower/ Hygiene May shower with protection but do not get wound dressing(s) wet. - purchase cast protector from [facility], [facility] or [employer] Edema Control - Lymphedema / SCD / Other Bilateral Lower Extremities Elevate legs to the level of the heart or above for 30 minutes daily and/or when sitting, a frequency of: Avoid standing for long periods of time. Exercise regularly - walking daily Moisturize legs daily. - right leg Wound Treatment Wound #1 - Lower Leg Wound Laterality: Left, Posterior Cleanser: Soap and Water 1 x Per Week/30 Days Discharge Instructions: May shower and wash wound with dial  antibacterial soap and water prior to dressing change. Peri-Wound Care: Triamcinolone 15 (g) 1 x Per Week/30 Days Discharge Instructions: Use triamcinolone 15 (g) as directed Peri-Wound Care: Sween Lotion (Moisturizing lotion) 1 x Per Week/30 Days Discharge Instructions: Apply moisturizing lotion as directed Prim Dressing: IODOFLEX 0.9% Cadexomer Iodine Pad 4x6 cm 1 x Per Week/30 Days ary Discharge Instructions: Apply to wound bed as instructed Secondary Dressing: ABD Pad, 8x10 1 x Per Week/30 Days Discharge Instructions: Apply over primary dressing as directed. Secondary Dressing: Woven Gauze Sponge, Non-Sterile 4x4 in 1 x Per  Week/30 Days Discharge Instructions: Apply over primary dressing as directed. Secured With: Coban Self-Adherent Wrap 4x5 (in/yd) 1 x Per Week/30 Days Zerbe, Ryen ([id number]) [id number].pdf Page 4 of 8 Discharge Instructions: Secure with Coban as directed. Secured With: [employer], 4.5x3.1 (in/yd) 1 x Per Week/30 Days Discharge Instructions: Secure with Kerlix as directed. Laboratory erobe culture (MICRO) - non healing wound Left posterior lower leg - (ICD10 L97.228 - Non- Bacteria identified in Unspecified specimen by A pressure chronic ulcer of left calf with other specified severity) LOINC Code: [id number] Convenience Name: Aerobic culture-specimen not specified Electronic Signature(s) Signed: 11/11/2021 10:39:50 AM By: Fredirick Maudlin MD FACS Signed: 11/11/2021 11:57:43 AM By: Worthy Rancher Previous Signature: 11/11/2021 9:56:40 AM Version By: Worthy Rancher Entered By: Worthy Rancher on 11/11/2021 10:30:07 -------------------------------------------------------------------------------- Problem List Details Patient Name: Date of Service: Christine Jacobson, Christine Jacobson 11/11/2021 9:15 A M Medical Record Number: [id number] Patient Account Number: [id number] Date of Birth/Sex: Treating RN: May 20, 1955 (66 y.o. Christine Jacobson Primary Care  Provider: PA Haig Prophet, [place] Other Clinician: Referring Provider: Treating Provider/Extender: Elizebeth Koller in Treatment: 1 Active Problems ICD-10 Encounter Code Description Active Date MDM Diagnosis I87.332 Chronic venous hypertension (idiopathic) with ulcer and inflammation of left 11/04/2021 No Yes lower extremity L97.228 Non-pressure chronic ulcer of left calf with other specified severity 11/04/2021 No Yes L03.116 Cellulitis of left lower limb 11/04/2021 No Yes Inactive Problems Resolved Problems Electronic Signature(s) Signed: 11/11/2021 9:55:20 AM By: Fredirick Maudlin MD FACS Entered By: Fredirick Maudlin on 11/11/2021 09:55:20 Progress Note Details -------------------------------------------------------------------------------- Christine Jacobson ([id number]) [id number] Page 5 of 8 Patient Name: Date of Service: Christine Jacobson, Christine Jacobson 11/11/2021 9:15 A M Medical Record Number: [id number] Patient Account Number: [id number] Date of Birth/Sex: Treating RN: 03/16/1955 (66 y.o. Christine Jacobson Primary Care Provider: PA Haig Prophet, [place] Other Clinician: Referring Provider: Treating Provider/Extender: Elizebeth Koller in Treatment: 1 Subjective Chief Complaint Information obtained from Patient 11/04/2021; patient is here for review of wounds on the left lower leg referred by Dr. Linus Salmons History of Present Illness (HPI) ADMISSION 11/04/2021 This is a 66 year old woman who comes into the clinic for review of wounds on the left leg. This includes several small areas on the left lateral lower leg as well as a substantial area on the left posterior calf. The patient was referred by Dr. Linus Salmons of infectious disease. She says that this began in the end of September when she developed lower extremity swelling and pain. She was seen in urgent care and put on Dalbvancin. However she required admission from 9/21 through 9/27 and was  treated with ceftriaxone outpatient cefadroxil. A CT scan of the leg was negative for abscess. She was referred as an outpatient to Dr. Arelia Longest who saw her on October 10 did not feel she had any additional infectious issues and referred her here. 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 She has been using a cream she had on the wounds on her left leg left over from that timeframe. Past medical history includes recent diagnosis of chronic diastolic heart failure, paroxysmal A-fib on Eliquis, HOCM/ASH, hypertension and a history of pyoderma that is a self-report. ABI in this clinic was 0.63. The patient is a non-smoker she is not a known diabetic 11/11/2021: The patient did not go for her vascular studies. She  says she did not understand what she was being called about. I spent some time explaining to her the value of those tests for her wound healing. Today, the 2 wounds have merged into 1. There is some slough on the wound surface and it remains extremely tender. No malodor or purulent drainage. Patient History Information obtained from Patient. Family History Cancer - Mother, Diabetes - Maternal Grandparents, Hypertension - Father, Lung Disease - Father, Stroke - Maternal Grandparents. Social History Former smoker - quit 20+ years ago, Marital Status - Single, Alcohol Use - Never, Drug Use - No History, Caffeine Use - Never. Medical History Hematologic/Lymphatic Patient has history of Lymphedema Cardiovascular Patient has history of Congestive Heart Failure, Hypertension Hospitalization/Surgery History - heart cath 10/15/21. Medical A Surgical History Notes nd Cardiovascular cardiomyopathy hyperlipidemia Psychiatric depression/anxiety Objective Constitutional Hypertensive, asymptomatic. Bradycardic,  asymptomatic.Marland Kitchen No acute distress.. Vitals Time Taken: 9:20 AM, Height: 64 in, Weight: 203 lbs, BMI: 34.8, Temperature: 97.6 F, Pulse: 53 bpm, Respiratory Rate: 20 breaths/min, Blood Pressure: 156/98 mmHg. General Notes: 11/11/2021: Today, the 2 wounds have merged into 1. There is some slough on the wound surface and it remains extremely tender. No malodor or purulent drainage. Integumentary (Hair, Skin) Wound #1 status is Open. Original cause of wound was Gradually Appeared. The date acquired was: 08/17/2021. The wound has been in treatment 1 weeks. The wound is located on the Left,Posterior Lower Leg. The wound measures 8.4cm length x 4.6cm width x 0.1cm depth; 30.348cm^2 area and 3.035cm^3 volume. There is Fat Layer (Subcutaneous Tissue) exposed. There is a medium amount of serosanguineous drainage noted. There is no granulation within the wound bed. There is a large (67-100%) amount of necrotic tissue within the wound bed including Adherent Slough. The periwound skin appearance exhibited: Kazi, Malia, Sharese ([id number]) [id number] Page 6 of 8 Erythema. The surrounding wound skin color is noted with erythema which is circumferential. Periwound temperature was noted as No Abnormality. The periwound has tenderness on palpation. Wound #2 status is Converted. Original cause of wound was Gradually Appeared. The date acquired was: 08/17/2021. The wound has been in treatment 1 weeks. The wound is located on the Left,Lateral Lower Leg. The wound measures 0.1cm length x 0.1cm width x 0.1cm depth; 0.008cm^2 area and 0.001cm^3 volume. There is no tunneling or undermining noted. There is a medium amount of serosanguineous drainage noted. There is small (1-33%) red, friable granulation within the wound bed. There is a large (67-100%) amount of necrotic tissue within the wound bed including Adherent Slough. The periwound skin appearance exhibited: Maceration. 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 There was a Selective/Open Wound Non-Viable Tissue Debridement with a total area of 38.64 sq cm performed by Fredirick Maudlin, MD. With the following instrument(s): Curette to remove Non-Viable tissue/material. Material removed includes [facility] after achieving pain control using Lidocaine 5% topical ointment. No specimens were taken. A time out was conducted at 09:47, prior to the start of the procedure. A Minimum amount of bleeding was controlled with Pressure. The procedure was tolerated well with a pain level of 5 throughout and a pain level of 0 following the procedure. Post Debridement Measurements: 8.4cm length x 4.6cm width x 0.1cm depth; 3.035cm^3  volume. Character of Wound/Ulcer Post Debridement requires further debridement. Severity of Tissue Post Debridement is: Fat layer exposed. Post procedure Diagnosis Wound #1: Same as Pre-Procedure General Notes: Scribed for Dr. Celine Ahr by Blanche East, RN. Plan Follow-up Appointments: Return Appointment in 1 week. - Dr. Celine Ahr - Room 3 Anesthetic: Wound #1 Left,Posterior Lower Leg: (In clinic) Topical Lidocaine 5% applied to wound bed Bathing/ Shower/ Hygiene: May shower with protection but do not get wound dressing(s) wet. - purchase cast protector from [facility], [facility] or [employer] Edema Control - Lymphedema / SCD / Other: Elevate legs to the level of the heart or above for 30 minutes daily and/or when sitting, a frequency of: Avoid standing for long periods of time. Exercise regularly - walking daily Moisturize legs daily. - right leg WOUND #1: - Lower Leg Wound  Laterality: Left, Posterior Cleanser: Soap and Water 1 x Per Week/30 Days Discharge Instructions: May shower and wash wound with dial antibacterial soap and water prior to dressing change. Peri-Wound Care: Triamcinolone 15 (g) 1 x Per Week/30 Days Discharge Instructions: Use triamcinolone 15 (g) as directed Peri-Wound Care: Sween Lotion (Moisturizing lotion) 1 x Per Week/30 Days Discharge Instructions: Apply moisturizing lotion as directed Prim Dressing: IODOFLEX 0.9% Cadexomer Iodine Pad 4x6 cm 1 x Per Week/30 Days ary Discharge Instructions: Apply to wound bed as instructed Secondary Dressing: ABD Pad, 8x10 1 x Per Week/30 Days Discharge Instructions: Apply over primary dressing as directed. Secondary Dressing: Woven Gauze Sponge, Non-Sterile 4x4 in 1 x Per Week/30 Days Discharge Instructions: Apply over primary dressing as directed. Secured With: Coban Self-Adherent Wrap 4x5 (in/yd) 1 x Per Week/30 Days Discharge Instructions: Secure with Coban as directed. Secured With: [employer], 4.5x3.1 (in/yd) 1 x Per Week/30 Days Discharge Instructions: Secure with Kerlix as directed. 11/11/2021: Today, the 2 wounds have merged into 1. There is some slough on the wound surface and it remains extremely tender. No malodor or purulent drainage. I used a curette to debride as much slough as the patient would permit. Debridement was limited secondary to pain. I also took a culture to see if there is any infectious reason that she is having such significant pain. I discussed the value of the arterial and venous reflux studies that were ordered and encouraged her to contact the vascular clinic to have these performed, preferably before our visit next week. For now we will continue Iodoflex and Kerlix and Coban compression. Follow-up in 1 week. Christine Jacobson, Christine Jacobson ([id number]) [id number] Page 7 of 8 Electronic Signature(s) Signed: 11/11/2021 10:11:36 AM By: Fredirick Maudlin  MD FACS Entered By: Fredirick Maudlin on 11/11/2021 10:11:36 -------------------------------------------------------------------------------- HxROS Details Patient Name: Date of Service: Christine Jacobson, Christine Jacobson 11/11/2021 9:15 A M Medical Record Number: [id number] Patient Account Number: [id number] Date of Birth/Sex: Treating RN: 04-02-1955 (66 y.o. Christine Jacobson Primary Care Provider: PA Haig Prophet, [place] Other Clinician: Referring Provider: Treating Provider/Extender: Elizebeth Koller in Treatment: 1 Information Obtained From Patient Hematologic/Lymphatic Medical History: Positive for: Lymphedema Cardiovascular Medical History: Positive for: Congestive Heart Failure; Hypertension Past Medical History Notes: cardiomyopathy hyperlipidemia Psychiatric Medical History: Past Medical History Notes: depression/anxiety Immunizations Pneumococcal Vaccine: Received Pneumococcal Vaccination: No Implantable Devices None Hospitalization / Surgery History Type of Hospitalization/Surgery heart cath 10/15/21 Family and Social History Cancer: Yes - Mother; Diabetes: Yes - Maternal Grandparents; Hypertension: Yes - Father; Lung Disease: Yes - Father; Stroke: Yes - Maternal Grandparents; Former smoker - quit 20+ years ago; Marital Status - Single; Alcohol Use: Never; Drug Use: No History; Caffeine  Use: Never; Financial Concerns: No; Food, Clothing or Shelter Needs: No; Support System Lacking: Yes - lives alone; Transportation Concerns: No Electronic Signature(s) Signed: 11/11/2021 10:39:50 AM By: Fredirick Maudlin MD FACS Signed: 11/11/2021 4:24:03 PM By: Adline Peals Entered By: Fredirick Maudlin on 11/11/2021 09:56:45 SuperBill Details -------------------------------------------------------------------------------- Christine Jacobson ([id number]) [id number] Page 8 of 8 Patient Name: Date of Service: Christine Jacobson, Christine Jacobson 11/11/2021 Medical Record  Number: [id number] Patient Account Number: [id number] Date of Birth/Sex: Treating RN: 11-15-1955 (66 y.o. Christine Jacobson Primary Care Provider: PA Haig Prophet, [place] Other Clinician: Referring Provider: Treating Provider/Extender: Elizebeth Koller in Treatment: 1 Diagnosis Coding ICD-10 Codes Code Description [id number] Chronic venous hypertension (idiopathic) with ulcer and inflammation of left lower extremity L97.228 Non-pressure chronic ulcer of left calf with other specified severity L03.116 Cellulitis of left lower limb Facility Procedures CPT4 Code Description Modifier Quantity [id number] - DEBRIDE WOUND 1ST 20 SQ CM OR < 1 ICD-10 Diagnosis Description L97.228 Non-pressure chronic ulcer of left calf with other specified severity [id number] - DEBRIDE WOUND EA ADDL 20 SQ CM 1 ICD-10 Diagnosis Description L97.228 Non-pressure chronic ulcer of left calf with other specified severity Physician Procedures Quantity CPT4 Code Description Modifier [id number] - WC PHYS LEVEL 3 - EST PT 25 1 ICD-10 Diagnosis Description I87.332 Chronic venous hypertension (idiopathic) with ulcer and inflammation of left lower extremity L97.228 Non-pressure chronic ulcer of left calf with other specified severity L03.116 Cellulitis of left lower limb [id number] - WC PHYS DEBR WO ANESTH 20 SQ CM 1 ICD-10 Diagnosis Description L97.228 Non-pressure chronic ulcer of left calf with other specified severity [id number] - WC PHYS DEBR WO ANESTH EA ADD 20 CM 1 ICD-10 Diagnosis Description L97.228 Non-pressure chronic ulcer of left calf with other specified severity Electronic Signature(s) Signed: 11/11/2021 10:11:57 AM By: Fredirick Maudlin MD FACS Entered By: Fredirick Maudlin on 11/11/2021 10:11:57

## 2021-11-12 ENCOUNTER — Other Ambulatory Visit (HOSPITAL_COMMUNITY): Payer: Self-pay | Admitting: Internal Medicine

## 2021-11-12 DIAGNOSIS — L97228 Non-pressure chronic ulcer of left calf with other specified severity: Secondary | ICD-10-CM

## 2021-11-12 DIAGNOSIS — I87332 Chronic venous hypertension (idiopathic) with ulcer and inflammation of left lower extremity: Secondary | ICD-10-CM

## 2021-11-16 NOTE — Progress Notes (Signed)
THEOLA, CUELLAR ([id number]) [id number] Page 1 of 8 Visit Report for 11/11/2021 Arrival Information Details Patient Name: Date of Service: RIHAM, POLYAKOV 11/11/2021 9:15 A M Medical Record Number: [id number] Patient Account Number: [id number] Date of Birth/Sex: Treating RN: 01-12-1956 (66 y.o. Female) Adline Peals Primary Care Brienne Liguori: PA [place], [place] Other Clinician: Referring Eoghan Belcher: Treating Ashley Montminy/Extender: Elizebeth Koller in Treatment: 1 Visit Information History Since Last Visit All ordered tests and consults were completed: No Patient Arrived: Ambulatory Added or deleted any medications: Yes Arrival Time: 09:22 Any new allergies or adverse reactions: No Transfer Assistance: None Had a fall or experienced change in No Patient Identification Verified: Yes activities of daily living that may affect Secondary Verification Process Completed: Yes risk of falls: Patient Requires Transmission-Based Precautions: No Signs or symptoms of abuse/neglect since last visito No Patient Has Alerts: No Hospitalized since last visit: No Implantable device outside of the clinic excluding No cellular tissue based products placed in the center since last visit: Pain Present Now: No Electronic Signature(s) Signed: 11/11/2021 9:56:40 AM By: Worthy Rancher Entered By: Worthy Rancher on 11/11/2021 09:23:09 -------------------------------------------------------------------------------- Encounter Discharge Information Details Patient Name: Date of Service: MEGHEN, AKOPYAN NNIE 11/11/2021 9:15 A M Medical Record Number: [id number] Patient Account Number: [id number] Date of Birth/Sex: Treating RN: 1955-08-09 (66 y.o. Female) Blanche East Primary Care Atiba Kimberlin: PA [place], [place] Other Clinician: Referring Gordan Grell: Treating Ruthe Roemer/Extender: Elizebeth Koller in Treatment: 1 Encounter Discharge Information Items Post Procedure  Vitals Discharge Condition: Stable Temperature (F): 97.6 Ambulatory Status: Ambulatory Pulse (bpm): 53 Discharge Destination: Home Respiratory Rate (breaths/min): 20 Transportation: Private Auto Blood Pressure (mmHg): 156/98 Accompanied By: self Schedule Follow-up Appointment: Yes Clinical Summary of Care: Electronic Signature(s) Signed: 11/16/2021 4:16:20 PM By: Blanche East RN Entered By: Blanche East on 11/11/2021 10:13:08 Velazco, [name] ([id number]) [id number] Page 2 of 8 -------------------------------------------------------------------------------- Lower Extremity Assessment Details Patient Name: Date of Service: PAULETTA, PICKNEY 11/11/2021 9:15 A M Medical Record Number: [id number] Patient Account Number: [id number] Date of Birth/Sex: Treating RN: Jun 10, 1955 (66 y.o. Female) Adline Peals Primary Care Wynonna Fitzhenry: PA [place], [place] Other Clinician: Referring Lasondra Hodgkins: Treating Mahagony Grieb/Extender: Elizebeth Koller in Treatment: 1 Edema Assessment Assessed: [Left: No] [Right: No] [Left: Edema] [Right: :] Calf Left: Right: Point of Measurement: 32 cm From Medial Instep 38.5 cm Ankle Left: Right: Point of Measurement: 10 cm From Medial Instep 27 cm Vascular Assessment Pulses: Dorsalis Pedis Palpable: [Left:Yes] Electronic Signature(s) Signed: 11/11/2021 9:56:40 AM By: Worthy Rancher Signed: 11/11/2021 4:24:03 PM By: Adline Peals Entered ByWorthy Rancher on 11/11/2021 09:41:17 -------------------------------------------------------------------------------- Multi Wound Chart Details Patient Name: Date of Service: ENRIQUETA, AUGUSTA NNIE 11/11/2021 9:15 A M Medical Record Number: [id number] Patient Account Number: [id number] Date of Birth/Sex: Treating RN: [birth year] (66 y.o. Female) Adline Peals Primary Care Nashawn Hillock: PA [place], [place] Other Clinician: Referring Haneefah Venturini: Treating Shena Vinluan/Extender: Elizebeth Koller in Treatment: 1 Vital Signs Height(in): 64 Pulse(bpm): 53 Weight(lbs): 203 Blood Pressure(mmHg): 156/98 Body Mass Index(BMI): 34.8 Temperature(F): 97.6 Respiratory Rate(breaths/min): 20 [1:Photos:] [2:No Photos] [N/A:N/A [id number] Page 3 of 8] Left, Posterior Lower Leg Left, Lateral Lower Leg N/A Wound Location: Gradually Appeared Gradually Appeared N/A Wounding Event: Venous Leg Ulcer Venous Leg Ulcer N/A Primary Etiology: Lymphedema, Congestive Heart Lymphedema, Congestive Heart N/A Comorbid History: Failure, Hypertension Failure, Hypertension 08/17/2021 08/17/2021 N/A Date Acquired: 1 1 N/A Weeks of Treatment: Open Converted N/A Wound Status: No No N/A Wound Recurrence: 8.4x4.6x0.1 0.1x0.1x0.1 N/A Measurements L x W x D (cm) 30.348 0.008 N/A  A (cm) : rea 3.035 0.001 N/A Volume (cm) : 38.70% 99.90% N/A % Reduction in A rea: 69.30% 99.90% N/A % Reduction in Volume: Full Thickness Without Exposed Full Thickness Without Exposed N/A Classification: Support Structures Support Structures Medium Medium N/A Exudate A mount: Serosanguineous Serosanguineous N/A Exudate Type: red, brown red, brown N/A Exudate Color: None Present (0%) Small (1-33%) N/A Granulation A mount: N/A Red, Friable N/A Granulation Quality: Large (67-100%) Large (67-100%) N/A Necrotic A mount: Fat Layer (Subcutaneous Tissue): Yes N/A N/A Exposed Structures: Fascia: No Tendon: No Muscle: No Joint: No Bone: No None N/A N/A Epithelialization: Debridement - Selective/Open Wound N/A N/A Debridement: Pre-procedure Verification/Time Out 09:47 N/A N/A Taken: Lidocaine 5% topical ointment N/A N/A Pain Control: Slough N/A N/A Tissue Debrided: Non-Viable Tissue N/A N/A Level: 38.64 N/A N/A Debridement A (sq cm): rea Curette N/A N/A Instrument: Minimum N/A N/A Bleeding: Pressure N/A N/A Hemostasis A chieved: 5 N/A N/A Procedural Pain: 0 N/A  N/A Post Procedural Pain: Procedure was tolerated well N/A N/A Debridement Treatment Response: 8.4x4.6x0.1 N/A N/A Post Debridement Measurements L x W x D (cm) 3.035 N/A N/A Post Debridement Volume: (cm) Dry/Scaly: Yes Maceration: Yes N/A Periwound Skin Moisture: Erythema: Yes N/A Periwound Skin Color: Circumferential N/A N/A Erythema Location: No Abnormality N/A N/A Temperature: Yes N/A N/A Tenderness on Palpation: Debridement N/A N/A Procedures Performed: Treatment Notes Electronic Signature(s) Signed: 11/11/2021 9:55:28 AM By: Fredirick Maudlin MD FACS Signed: 11/11/2021 4:24:03 PM By: Adline Peals Entered By: Fredirick Maudlin on 11/11/2021 09:55:28 -------------------------------------------------------------------------------- Multi-Disciplinary Care Plan Details Patient Name: Date of Service: SITARA, BURHOP NNIE 11/11/2021 9:15 A M Medical Record Number: [id number] Patient Account Number: [id number] Date of Birth/Sex: Treating RN: 1955-06-20 (66 y.o. Female) Adline Peals Primary Care Tico Crotteau: PA [place], [place] Other Clinician: Referring Waver Dibiasio: Treating Meleana Commerford/Extender: Elizebeth Koller in Treatment: 1 Active Inactive Anderson, Shaquitta ([id number]) [id number] Page 4 of 8 Orientation to the Wound Care Program Nursing Diagnoses: Knowledge deficit related to the wound healing center program Goals: Patient/caregiver will verbalize understanding of the [place] Date Initiated: 11/04/2021 Target Resolution Date: 11/19/2021 Goal Status: Active Interventions: Provide education on orientation to the wound center Notes: Venous Leg Ulcer Nursing Diagnoses: Knowledge deficit related to disease process and management Goals: Patient will maintain optimal edema control Date Initiated: 11/04/2021 Target Resolution Date: 12/02/2021 Goal Status: Active Patient/caregiver will verbalize understanding of  disease process and disease management Date Initiated: 11/04/2021 Target Resolution Date: 12/16/2021 Goal Status: Active Verify adequate tissue perfusion prior to therapeutic compression application Date Initiated: 11/04/2021 Target Resolution Date: 12/23/2021 Goal Status: Active Interventions: Assess peripheral edema status every visit. Compression as ordered Provide education on venous insufficiency Notes: Wound/Skin Impairment Nursing Diagnoses: Impaired tissue integrity Knowledge deficit related to ulceration/compromised skin integrity Goals: Patient/caregiver will verbalize understanding of skin care regimen Date Initiated: 11/04/2021 Target Resolution Date: 12/09/2021 Goal Status: Active Ulcer/skin breakdown will have a volume reduction of 30% by week 4 Date Initiated: 11/04/2021 Target Resolution Date: 12/02/2021 Goal Status: Active Interventions: Assess patient/caregiver ability to obtain necessary supplies Assess patient/caregiver ability to perform ulcer/skin care regimen upon admission and as needed Assess ulceration(s) every visit Provide education on ulcer and skin care Notes: Electronic Signature(s) Signed: 11/11/2021 9:56:40 AM By: Worthy Rancher Signed: 11/11/2021 4:24:03 PM By: Adline Peals Entered ByWorthy Rancher on 11/11/2021 09:37:06 -------------------------------------------------------------------------------- Pain Assessment Details Patient Name: Date of Service: LACOYA, DOBOSZ NNIE 11/11/2021 9:15 A Alfred Levins, Shaun ([id number] Page 5 of 8 Medical Record Number: [id number] Patient Account  Number: [id number] Date of Birth/Sex: Treating RN: 03-Sep-1955 (66 y.o. Female) Blanche East Primary Care Donnella Morford: PA [place], [place] Other Clinician: Referring Clevon Khader: Treating Tylar Merendino/Extender: Elizebeth Koller in Treatment: 1 Active Problems Location of Pain Severity and Description of Pain Patient Has  Paino No Site Locations Rate the pain. Current Pain Level: 0 Pain Management and Medication Current Pain Management: Electronic Signature(s) Signed: 11/16/2021 4:16:20 PM By: Blanche East RN Entered By: Blanche East on 11/11/2021 09:32:57 -------------------------------------------------------------------------------- Patient/Caregiver Education Details Patient Name: Date of Service: Newell Coral NNIE 10/26/2023andnbsp9:15 A M Medical Record Number: [id number] Patient Account Number: [id number] Date of Birth/Gender: Treating RN: 1955-05-25 (66 y.o. Female) Adline Peals Primary Care Physician: PA [place], [place] Other Clinician: Referring Physician: Treating Physician/Extender: Elizebeth Koller in Treatment: 1 Education Assessment Education Provided To: Patient Education Topics Provided Venous: Methods: Explain/Verbal Responses: Reinforcements needed, State content correctly Wound/Skin Impairment: Methods: Explain/Verbal Responses: Reinforcements needed, State content correctly Electronic Signature(s) Signed: 11/11/2021 9:56:40 AM By: Worthy Rancher Entered By: Worthy Rancher on 11/11/2021 09:37:35 Hungate, Modesta ([id number]) [id number] Page 6 of 8 -------------------------------------------------------------------------------- Wound Assessment Details Patient Name: Date of Service: CATALINA, STRAHM 11/11/2021 9:15 A M Medical Record Number: [id number] Patient Account Number: [id number] Date of Birth/Sex: Treating RN: 04/21/1955 (66 y.o. Female) Adline Peals Primary Care Ceirra Belli: PA [place], [place] Other Clinician: Referring Adrionna Delcid: Treating Khaled Herda/Extender: Elizebeth Koller in Treatment: 1 Wound Status Wound Number: 1 Primary Etiology: Venous Leg Ulcer Wound Location: Left, Posterior Lower Leg Wound Status: Open Wounding Event: Gradually Appeared Notes: wound has combined with lateral wound Date  Acquired: 08/17/2021 Comorbid History: Lymphedema, Congestive Heart Failure, Hypertension Weeks Of Treatment: 1 Clustered Wound: No Photos Wound Measurements Length: (cm) 8.4 Width: (cm) 4.6 Depth: (cm) 0.1 Area: (cm) 30.348 Volume: (cm) 3.035 % Reduction in Area: 38.7% % Reduction in Volume: 69.3% Epithelialization: None Wound Description Classification: Full Thickness Without Exposed Support Exudate Amount: Medium Exudate Type: Serosanguineous Exudate Color: red, brown Structures Foul Odor After Cleansing: No Slough/Fibrino Yes Wound Bed Granulation Amount: None Present (0%) Exposed Structure Necrotic Amount: Large (67-100%) Fascia Exposed: No Necrotic Quality: Adherent Slough Fat Layer (Subcutaneous Tissue) Exposed: Yes Tendon Exposed: No Muscle Exposed: No Joint Exposed: No Bone Exposed: No Periwound Skin Texture Texture Color No Abnormalities Noted: No No Abnormalities Noted: No Erythema: Yes Moisture Erythema Location: Circumferential No Abnormalities Noted: No Dry / Scaly: Yes Temperature / Pain Temperature: No Abnormality Tenderness on Palpation: Yes Treatment Notes Dotzler, Kindle ([id number] Page 7 of 8 Wound #1 (Lower Leg) Wound Laterality: Left, Posterior Cleanser Soap and Water Discharge Instruction: May shower and wash wound with dial antibacterial soap and water prior to dressing change. Peri-Wound Care Triamcinolone 15 (g) Discharge Instruction: Use triamcinolone 15 (g) as directed Sween Lotion (Moisturizing lotion) Discharge Instruction: Apply moisturizing lotion as directed Topical Primary Dressing IODOFLEX 0.9% Cadexomer Iodine Pad 4x6 cm Discharge Instruction: Apply to wound bed as instructed Secondary Dressing ABD Pad, 8x10 Discharge Instruction: Apply over primary dressing as directed. Woven Gauze Sponge, Non-Sterile 4x4 in Discharge Instruction: Apply over primary dressing as directed. Secured  With [employer] 4x5 (in/yd) Discharge Instruction: Secure with Coban as directed. Kerlix Roll Sterile, 4.5x3.1 (in/yd) Discharge Instruction: Secure with Kerlix as directed. Compression Wrap Compression Stockings Add-Ons Electronic Signature(s) Signed: 11/11/2021 9:56:40 AM By: Worthy Rancher Signed: 11/11/2021 4:24:03 PM By: Adline Peals Entered By: Worthy Rancher on 11/11/2021 09:36:37 -------------------------------------------------------------------------------- Wound Assessment Details Patient Name: Date of Service: [place], [name] 11/11/2021 9:15 A M  Medical Record Number: [id number] Patient Account Number: [id number] Date of Birth/Sex: Treating RN: [birth year] (66 y.o. Female) Adline Peals Primary Care Jaleal Schliep: PA [place], [place] Other Clinician: Referring Jolynda Townley: Treating Aizlyn Schifano/Extender: Elizebeth Koller in Treatment: 1 Wound Status Wound Number: 2 Primary Etiology: Venous Leg Ulcer Wound Location: Left, Lateral Lower Leg Wound Status: Converted Wounding Event: Gradually Appeared Comorbid History: Lymphedema, Congestive Heart Failure, Hypertension Date Acquired: 08/17/2021 Weeks Of Treatment: 1 Clustered Wound: No Wound Measurements Length: (cm) 0.1 Width: (cm) 0.1 Depth: (cm) 0.1 Area: (cm) 0.008 Volume: (cm) 0.001 % Reduction in Area: 99.9% % Reduction in Volume: 99.9% Tunneling: No Undermining: No Wound Description Pitcher, Rivka ([id number]) Classification: Full Thickness Without Exposed Support Structures Exudate Amount: Medium Exudate Type: Serosanguineous Exudate Color: red, brown [id number] Page 8 of 8 Wound Bed Granulation Amount: Small (1-33%) Granulation Quality: Red, Friable Necrotic Amount: Large (67-100%) Necrotic Quality: Adherent Slough Periwound Skin Texture Texture Color No Abnormalities Noted: No No Abnormalities Noted: No Moisture No Abnormalities Noted:  No Maceration: Yes Treatment Notes Wound #2 (Lower Leg) Wound Laterality: Left, Lateral Cleanser Peri-Wound Care Topical Primary Dressing Secondary Dressing Secured With Compression Wrap Compression Stockings Add-Ons Electronic Signature(s) Signed: 11/11/2021 9:56:40 AM By: Worthy Rancher Signed: 11/11/2021 4:24:03 PM By: Adline Peals Entered ByWorthy Rancher on 11/11/2021 09:36:34 -------------------------------------------------------------------------------- Vitals Details Patient Name: Date of Service: MICAYLA, ROMM NNIE 11/11/2021 9:15 A M Medical Record Number: [id number] Patient Account Number: [id number] Date of Birth/Sex: Treating RN: 06-26-1955 (66 y.o. Female) Adline Peals Primary Care Velisa Regnier: PA [place], [place] Other Clinician: Referring Codylee Patil: Treating Arush Gatliff/Extender: Elizebeth Koller in Treatment: 1 Vital Signs Time Taken: 09:20 Temperature (F): 97.6 Height (in): 64 Pulse (bpm): 53 Weight (lbs): 203 Respiratory Rate (breaths/min): 20 Body Mass Index (BMI): 34.8 Blood Pressure (mmHg): 156/98 Reference Range: 80 - 120 mg / dl Electronic Signature(s) Signed: 11/11/2021 9:56:40 AM By: Worthy Rancher Entered By: Worthy Rancher on 11/11/2021 09:24:01

## 2021-11-18 ENCOUNTER — Ambulatory Visit (HOSPITAL_COMMUNITY)
Admission: RE | Admit: 2021-11-18 | Discharge: 2021-11-18 | Disposition: A | Discharge status: Home / Self Care | Payer: Medicare Other | Source: Ambulatory Visit | Attending: Internal Medicine | Admitting: Internal Medicine

## 2021-11-18 ENCOUNTER — Telehealth: Payer: Self-pay

## 2021-11-18 DIAGNOSIS — I87332 Chronic venous hypertension (idiopathic) with ulcer and inflammation of left lower extremity: Secondary | ICD-10-CM | POA: Diagnosis present

## 2021-11-18 DIAGNOSIS — L97228 Non-pressure chronic ulcer of left calf with other specified severity: Secondary | ICD-10-CM

## 2021-11-18 NOTE — Telephone Encounter (Signed)
I called and spoke with patient. She stated that for her own personal reasons, she has decided to leave the practice and seek care elsewhere. She does not agree with lifestyles changes advised to her. She has asked that no one else from our practice call her again, including providers.

## 2021-11-19 ENCOUNTER — Encounter (HOSPITAL_BASED_OUTPATIENT_CLINIC_OR_DEPARTMENT_OTHER): Payer: Medicare Other | Admitting: General Surgery

## 2021-11-23 ENCOUNTER — Telehealth: Payer: Self-pay | Admitting: Cardiology

## 2021-11-23 ENCOUNTER — Emergency Department (HOSPITAL_BASED_OUTPATIENT_CLINIC_OR_DEPARTMENT_OTHER): Payer: Medicare Other | Admitting: Radiology

## 2021-11-23 ENCOUNTER — Other Ambulatory Visit: Payer: Self-pay

## 2021-11-23 ENCOUNTER — Encounter (HOSPITAL_BASED_OUTPATIENT_CLINIC_OR_DEPARTMENT_OTHER): Payer: Self-pay

## 2021-11-23 ENCOUNTER — Emergency Department (HOSPITAL_BASED_OUTPATIENT_CLINIC_OR_DEPARTMENT_OTHER)
Admission: EM | Admit: 2021-11-23 | Discharge: 2021-11-23 | Disposition: A | Discharge status: Home / Self Care | Payer: Medicare Other | Attending: Emergency Medicine | Admitting: Emergency Medicine

## 2021-11-23 DIAGNOSIS — R0609 Other forms of dyspnea: Secondary | ICD-10-CM

## 2021-11-23 DIAGNOSIS — R0602 Shortness of breath: Secondary | ICD-10-CM | POA: Diagnosis present

## 2021-11-23 DIAGNOSIS — R112 Nausea with vomiting, unspecified: Secondary | ICD-10-CM | POA: Diagnosis not present

## 2021-11-23 DIAGNOSIS — I110 Hypertensive heart disease with heart failure: Secondary | ICD-10-CM | POA: Diagnosis not present

## 2021-11-23 DIAGNOSIS — R0600 Dyspnea, unspecified: Secondary | ICD-10-CM | POA: Insufficient documentation

## 2021-11-23 DIAGNOSIS — Z79899 Other long term (current) drug therapy: Secondary | ICD-10-CM | POA: Diagnosis not present

## 2021-11-23 DIAGNOSIS — I421 Obstructive hypertrophic cardiomyopathy: Secondary | ICD-10-CM

## 2021-11-23 DIAGNOSIS — I509 Heart failure, unspecified: Secondary | ICD-10-CM | POA: Diagnosis not present

## 2021-11-23 DIAGNOSIS — Z7901 Long term (current) use of anticoagulants: Secondary | ICD-10-CM | POA: Insufficient documentation

## 2021-11-23 DIAGNOSIS — I4891 Unspecified atrial fibrillation: Secondary | ICD-10-CM | POA: Diagnosis not present

## 2021-11-23 DIAGNOSIS — E876 Hypokalemia: Secondary | ICD-10-CM | POA: Insufficient documentation

## 2021-11-23 DIAGNOSIS — I4819 Other persistent atrial fibrillation: Secondary | ICD-10-CM

## 2021-11-23 LAB — BASIC METABOLIC PANEL
Anion gap: 13 (ref 5–15)
BUN: 24 mg/dL — ABNORMAL HIGH (ref 8–23)
CO2: 26 mmol/L (ref 22–32)
Calcium: 10.1 mg/dL (ref 8.9–10.3)
Chloride: 104 mmol/L (ref 98–111)
Creatinine, Ser: 1.47 mg/dL — ABNORMAL HIGH (ref 0.44–1.00)
GFR, Estimated: 39 mL/min — ABNORMAL LOW (ref >60)
Glucose, Bld: 121 mg/dL — ABNORMAL HIGH (ref 70–99)
Potassium: 3 mmol/L — ABNORMAL LOW (ref 3.5–5.1)
Sodium: 143 mmol/L (ref 135–145)

## 2021-11-23 LAB — BRAIN NATRIURETIC PEPTIDE: B Natriuretic Peptide: 691.2 pg/mL — ABNORMAL HIGH (ref 0.0–100.0)

## 2021-11-23 LAB — CBC
HCT: 40.1 % (ref 36.0–46.0)
Hemoglobin: 13.3 g/dL (ref 12.0–15.0)
MCH: 32 pg (ref 26.0–34.0)
MCHC: 33.2 g/dL (ref 30.0–36.0)
MCV: 96.6 fL (ref 80.0–100.0)
Platelets: 208 10*3/uL (ref 150–400)
RBC: 4.15 MIL/uL (ref 3.87–5.11)
RDW: 15.6 % — ABNORMAL HIGH (ref 11.5–15.5)
WBC: 9.7 10*3/uL (ref 4.0–10.5)
nRBC: 0 % (ref 0.0–0.2)

## 2021-11-23 LAB — TROPONIN I (HIGH SENSITIVITY)
Troponin I (High Sensitivity): 11 ng/L (ref <18)
Troponin I (High Sensitivity): 10 ng/L (ref <18)

## 2021-11-23 LAB — LIPASE, BLOOD: Lipase: 25 U/L (ref 11–51)

## 2021-11-23 MED ORDER — POTASSIUM CHLORIDE CRYS ER 20 MEQ PO TBCR
40.0000 meq | EXTENDED_RELEASE_TABLET | Freq: Once | ORAL | Status: AC
  Administered 2021-11-23: 40 meq via ORAL
  Filled 2021-11-23: qty 2

## 2021-11-23 MED ORDER — SODIUM CHLORIDE 0.9 % IV BOLUS
250.0000 mL | Freq: Once | INTRAVENOUS | Status: AC
  Administered 2021-11-23: 250 mL via INTRAVENOUS

## 2021-11-23 MED ORDER — APIXABAN 2.5 MG PO TABS
5.0000 mg | ORAL_TABLET | ORAL | Status: AC
  Administered 2021-11-23: 5 mg via ORAL
  Filled 2021-11-23: qty 2

## 2021-11-23 NOTE — Discharge Instructions (Addendum)
Please follow-up with your cardiologist.  Return to the ER for any new or concerning symptoms Please continue to take all your medications as prescribed as you are doing.

## 2021-11-23 NOTE — ED Triage Notes (Signed)
Pt c/o sob that worsens with exertion for the past couple of days. Also c/o nausea and vomiting since yesterday. Pt denies chest pain. Hx of afib. Pt is AxOx4.

## 2021-11-23 NOTE — Telephone Encounter (Signed)
ON-CALL CARDIOLOGY 11/23/21  Patient's name: Christine Jacobson.   MRN: [id number].    DOB: 07-12-1955 Primary care provider: Patient, No Pcp Per. Primary cardiologist: Dr. Adrian Prows   Interaction regarding this patient's care today: ED provider called to discuss her case.   Complex cardiac hx w/ HoCM, AFIB, and other co-morbid conditions.   Presented w/ cc shortness of breath for last 2-3 weeks.  EKG - Afib rate controlled (HR 62-71bpm) Troponin - negative time two.  BNP- slightly above baseline (non-compliant to diuretics also).  No chest pain.  No near syncope or syncope.   Impression: Shortness of breath HOCM Afib  Recommendations: Continue current medication.  Educated on the importance to continue home diuretics to stay euvolemic.  No signs of ACS and ventricular rate is well controlled.  Continue BB and CCB to decrease cavitary gradients.  Will recommend follow up in office within the next week.  Patient asked to call the office if she does not hear back from [place] in 48hrs.  Telephone encounter total time: 10 minutes   Mechele Claude [facility]  Pager: [phone] Office: [phone]

## 2021-11-23 NOTE — ED Notes (Signed)
Discharge paperwork given and verbally understood. 

## 2021-11-23 NOTE — ED Provider Notes (Signed)
[facility] EMERGENCY DEPT Provider Note   CSN: [id number] Arrival date & time: 11/23/21  1330     History  Chief Complaint  Patient presents with   Shortness of Breath   Nausea   Emesis    Christine Jacobson is a 66 y.o. female.   Shortness of Breath Associated symptoms: vomiting   Emesis Patient is a 66 year old female with past medical history significant for hypertrophic obstructive cardiomyopathy, CHF, HLD, palpitations, obesity, A-fib on Eliquis and diltiazem, metoprolol, torsemide, HTN, chest pain  Seems that over the past 2 to 3 weeks she has had intermittent shortness of breath she states it is worse with exertion not associated chest pain.  She states she feels fatigued and has had some episodes of nonbloody nonbilious emesis.     Home Medications Prior to Admission medications   Medication Sig Start Date End Date Taking? Authorizing Provider  apixaban (ELIQUIS) 5 MG TABS tablet Take 1 tablet (5 mg total) by mouth 2 (two) times daily. 03/30/21   [name], NP  diltiazem (CARDIZEM CD) 180 MG 24 hr capsule Take 1 capsule (180 mg total) by mouth in the morning and at bedtime. 09/09/21 12/08/21  Adrian Prows, MD  hydrOXYzine (ATARAX) 25 MG tablet Take 25 mg by mouth 4 (four) times daily. 11/08/21   [provider]  IBUPROFEN PO Take by mouth as needed.    [provider]  JARDIANCE 10 MG TABS tablet TAKE 1 TABLET BY MOUTH DAILY BEFORE BREAKFAST 11/08/21   Adrian Prows, MD  levofloxacin (LEVAQUIN) 750 MG tablet Take 750 mg by mouth daily. 11/15/21   [provider]  metoprolol succinate (TOPROL-XL) 100 MG 24 hr tablet TAKE ONE TABLET BY MOUTH TWICE A DAY WITH OR IMMEDIATELY FOLLOWING A MEAL Patient taking differently: Take 100 mg by mouth in the morning and at bedtime. 05/25/21   [name], [name], MD  potassium chloride SA (KLOR-CON M) 20 MEQ tablet Take 1 tablet (20 mEq total) by mouth 2 (two) times daily. 09/22/21   Adrian Prows, MD   predniSONE (DELTASONE) 20 MG tablet Take 20 mg by mouth 2 (two) times daily. 11/08/21   [provider]  sertraline (ZOLOFT) 50 MG tablet Take 1 tablet (50 mg total) by mouth at bedtime. 10/29/21   Adrian Prows, MD  torsemide (DEMADEX) 20 MG tablet Take 1 tablet (20 mg total) by mouth 2 (two) times daily at 10 am and 4 pm. 09/22/21   Adrian Prows, MD      Allergies    Patient has no known allergies.    Review of Systems   Review of Systems  Respiratory:  Positive for shortness of breath.   Gastrointestinal:  Positive for vomiting.    Physical Exam Updated Vital Signs BP (!) 141/97   Pulse (!) 58   Temp 98.5 F (36.9 C)   Resp 16   Ht 5\' 4"  (1.626 m)   Wt 93 kg   SpO2 95%   BMI 35.19 kg/m  Physical Exam Vitals and nursing note reviewed.  Constitutional:      General: She is not in acute distress. HENT:     Head: Normocephalic and atraumatic.     Nose: Nose normal.     Mouth/Throat:     Mouth: Mucous membranes are moist.  Eyes:     General: No scleral icterus. Cardiovascular:     Rate and Rhythm: Normal rate. Rhythm irregular.     Pulses: Normal pulses.     Heart sounds:  Normal heart sounds.  Pulmonary:     Effort: Pulmonary effort is normal. No respiratory distress.     Breath sounds: No wheezing.  Abdominal:     Palpations: Abdomen is soft.     Tenderness: There is no abdominal tenderness. There is no guarding or rebound.  Musculoskeletal:     Cervical back: Normal range of motion.     Right lower leg: No edema.     Left lower leg: No edema.  Skin:    General: Skin is warm and dry.     Capillary Refill: Capillary refill takes less than 2 seconds.     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 Mental status is at baseline.  Psychiatric:        Mood and Affect: Mood normal.        Behavior: Behavior normal.     ED Results / Procedures / Treatments   Labs (all labs ordered are listed, but only  abnormal results are displayed) Labs Reviewed  BASIC METABOLIC PANEL - Abnormal; Notable for the following components:      Result Value   Potassium 3.0 (*)    Glucose, Bld 121 (*)    BUN 24 (*)    Creatinine, Ser 1.47 (*)    GFR, Estimated 39 (*)    All other components within normal limits  CBC - Abnormal; Notable for the following components:   RDW 15.6 (*)    All other components within normal limits  BRAIN NATRIURETIC PEPTIDE - Abnormal; Notable for the following components:   B Natriuretic Peptide 691.2 (*)    All other components within normal limits  LIPASE, BLOOD  TROPONIN I (HIGH SENSITIVITY)  TROPONIN I (HIGH SENSITIVITY)    EKG EKG Interpretation  Date/Time:  Tuesday November 23 2021 13:40:01 EST Ventricular Rate:  75 PR Interval:    QRS Duration: 94 QT Interval:  448 QTC Calculation: 500 R Axis:   -13 Text Interpretation: Atrial fibrillation Minimal voltage criteria for LVH, may be normal variant ( Cornell product ) ST & T wave abnormality, consider inferior ischemia Prolonged QT Abnormal ECG When compared with ECG of 13-Oct-2021 11:52, QT has lengthened Confirmed by Margaretmary Eddy [phone]) on 11/23/2021 3:13:35 PM  Radiology DG Chest 2 View  Result Date: 11/23/2021 CLINICAL DATA:  Shortness of breath. EXAM: CHEST - 2 VIEW COMPARISON:  October 13, 2021. FINDINGS: Stable cardiomediastinal silhouette. Both lungs are clear. The visualized skeletal structures are unremarkable. IMPRESSION: No active cardiopulmonary disease. Electronically Signed   By: Marijo Conception M.D.   On: 11/23/2021 14:32    Procedures Procedures    Medications Ordered in ED Medications  potassium chloride SA (KLOR-CON M) CR tablet 40 mEq (40 mEq Oral Given 11/23/21 1648)  apixaban (ELIQUIS) tablet 5 mg (5 mg Oral Given 11/23/21 1646)  sodium chloride 0.9 % bolus 250 mL (0 mLs Intravenous Stopped 11/23/21 1833)    ED Course/ Medical Decision Making/ A&P Clinical Course as of 11/23/21  2015  Tue Nov 23, 2021  1445 2-3 weeks of intermittent SOB worse with exertion. States SOB, fatigue, vomiting.  [WF]  25 Dr. Wilburt Finlay cards appointment in 2 weeks.  [WF]  1459 13ft before dyspneic  [WF]  1820 Discussed with Dr. Terri Skains  [WF]    Clinical Course User Index [WF] Tedd Sias, [place]  Medical Decision Making Amount and/or Complexity of Data Reviewed Labs: ordered. Radiology: ordered.  Risk Prescription drug management.   This patient presents to the ED for concern of shortness of breath with exertion, this involves a number of treatment options, and is a complaint that carries with it a high risk of complications and morbidity. A differential diagnosis was considered for the patient's symptoms which is discussed below:   The causes for shortness of breath include but are not limited to Cardiac (AHF, pericardial effusion and tamponade, arrhythmias, ischemia, etc) Respiratory (COPD, asthma, pneumonia, pneumothorax, primary pulmonary hypertension, PE/VQ mismatch) Hematological (anemia) Neuromuscular (ALS, Guillain-Barr, etc)    In this particular case the patient is appearing on exam.  Seems to be chronically stably ill.   Co morbidities: Discussed in HPI   Brief History:  Patient is a 66 year old female with past medical history significant for hypertrophic obstructive cardiomyopathy, CHF, HLD, palpitations, obesity, A-fib on Eliquis and diltiazem, metoprolol, torsemide, HTN, chest pain  Seems that over the past 2 to 3 weeks she has had intermittent shortness of breath she states it is worse with exertion not associated chest pain.  She states she feels fatigued and has had some episodes of nonbloody nonbilious emesis.    EMR reviewed including pt PMHx, past surgical history and past visits to ER.   See HPI for more details   Lab Tests:   I ordered and independently interpreted labs. Labs notable for BNP elevated at  approximately 700 this is not particularly far from her previous levels.  BMP notable for hypokalemia repleted here p.o.  Kidney function approximately baseline.  CBC without any leukocytosis or new anemia.  Lipase within normal limits troponin x2 within normal limits.  Imaging Studies:  NAD. I personally reviewed all imaging studies and no acute abnormality found. I agree with radiology interpretation.    Cardiac Monitoring:  The patient was maintained on a cardiac monitor.  I personally viewed and interpreted the cardiac monitored which showed an underlying rhythm of:  A-fib with normal rate EKG non-ischemic A-fib with normal rate   Medicines ordered:  I ordered medication including Eliquis, 250 mL fluid bolus, potassium for hypokalemia and lightheadedness Reevaluation of the patient after these medicines showed that the patient improved I have reviewed the patients home medicines and have made adjustments as needed   Critical Interventions:     Consults/Attending Physician   I requested consultation with Dr Terri Skains,  and discussed lab and imaging findings as well as pertinent plan - they recommend: Medication compliance, they are in agreement with my plan to give a 250 mL fluid bolus.  Recommend close follow-up on the outpatient basis  I discussed this case with my attending physician who cosigned this note including patient's presenting symptoms, physical exam, and planned diagnostics and interventions. Attending physician stated agreement with plan or made changes to plan which were implemented.   Attending physician assessed patient at bedside.   Reevaluation:  After the interventions noted above I re-evaluated patient and found that they have :improved orthostatic vital signs negative   Social Determinants of Health:      Problem List / ED Course:  Fatigue, lightheadedness, dyspnea on exertion.  Ultimately patient overall seems to be stable.  No acute changes.   Lab work reassuring, EKG without significant changes.  She is chronically in A-fib.  Seems to have some medication noncompliance issues.  Cardiology will see in follow-up.   Dispostion:  After consideration of the diagnostic results and the  patients response to treatment, I feel that the patent would benefit from close outpatient follow-up.  Return precautions discussed    Final Clinical Impression(s) / ED Diagnoses Final diagnoses:  Dyspnea on exertion    Rx / DC Orders ED Discharge Orders     None         Tedd Sias, [place] 11/23/21 2017    Regan Lemming, MD 11/24/21 1457

## 2021-11-24 ENCOUNTER — Encounter (HOSPITAL_BASED_OUTPATIENT_CLINIC_OR_DEPARTMENT_OTHER): Payer: Medicare Other | Admitting: General Surgery

## 2021-11-24 NOTE — Telephone Encounter (Signed)
I called and spoke with patient. She stated that she herself did not call the "on-call" line, but the ED provider called because they were concerned and wanted patient to be seen with a cardiologists soon. She was upset that they call [facility] on-call provider, but I explained to her that if she gave our name for her cardiologist, that is why they called. She understood and said, she did explain to them after the call, that she is still currently looking for another cardiologist and does not want to be seen with [facility] providers and did not want [facility] staff to call her anymore. I did advise her that she will need to find a new cardiologist soon and not give our name, if she does not want to be called anymore. Patient voiced understanding.

## 2021-11-24 NOTE — Telephone Encounter (Signed)
Please contact patient ASAP to schedule appointment. Thank you.

## 2021-11-24 NOTE — Telephone Encounter (Signed)
Can you call Epic and find out how we can flag patients that are dismissed or do not want to see [place]

## 2021-11-24 NOTE — Telephone Encounter (Signed)
She did call our on-call line, so Im guessing we should call her to see if she is wanting to be seen by [facility] or another provider.

## 2021-12-01 ENCOUNTER — Encounter (HOSPITAL_BASED_OUTPATIENT_CLINIC_OR_DEPARTMENT_OTHER): Payer: Medicare Other | Attending: Internal Medicine | Admitting: General Surgery

## 2021-12-01 DIAGNOSIS — I5032 Chronic diastolic (congestive) heart failure: Secondary | ICD-10-CM | POA: Insufficient documentation

## 2021-12-01 DIAGNOSIS — I110 Hypertensive heart disease with heart failure: Secondary | ICD-10-CM | POA: Insufficient documentation

## 2021-12-01 DIAGNOSIS — I87332 Chronic venous hypertension (idiopathic) with ulcer and inflammation of left lower extremity: Secondary | ICD-10-CM | POA: Insufficient documentation

## 2021-12-01 DIAGNOSIS — L03116 Cellulitis of left lower limb: Secondary | ICD-10-CM | POA: Diagnosis not present

## 2021-12-01 DIAGNOSIS — Z7901 Long term (current) use of anticoagulants: Secondary | ICD-10-CM | POA: Diagnosis not present

## 2021-12-01 DIAGNOSIS — B964 Proteus (mirabilis) (morganii) as the cause of diseases classified elsewhere: Secondary | ICD-10-CM | POA: Diagnosis not present

## 2021-12-01 DIAGNOSIS — B965 Pseudomonas (aeruginosa) (mallei) (pseudomallei) as the cause of diseases classified elsewhere: Secondary | ICD-10-CM | POA: Diagnosis not present

## 2021-12-01 DIAGNOSIS — L97228 Non-pressure chronic ulcer of left calf with other specified severity: Secondary | ICD-10-CM | POA: Diagnosis not present

## 2021-12-01 DIAGNOSIS — I480 Paroxysmal atrial fibrillation: Secondary | ICD-10-CM | POA: Diagnosis not present

## 2021-12-01 DIAGNOSIS — B9689 Other specified bacterial agents as the cause of diseases classified elsewhere: Secondary | ICD-10-CM | POA: Diagnosis not present

## 2021-12-02 ENCOUNTER — Ambulatory Visit: Payer: Medicare Other | Attending: Cardiology | Admitting: Cardiology

## 2021-12-02 ENCOUNTER — Encounter: Payer: Self-pay | Admitting: Cardiology

## 2021-12-02 VITALS — BP 100/62 | HR 67 | Ht 64.0 in | Wt 205.0 lb

## 2021-12-02 DIAGNOSIS — R5383 Other fatigue: Secondary | ICD-10-CM | POA: Diagnosis not present

## 2021-12-02 DIAGNOSIS — Z01812 Encounter for preprocedural laboratory examination: Secondary | ICD-10-CM

## 2021-12-02 DIAGNOSIS — Z79899 Other long term (current) drug therapy: Secondary | ICD-10-CM | POA: Diagnosis not present

## 2021-12-02 DIAGNOSIS — R0602 Shortness of breath: Secondary | ICD-10-CM | POA: Diagnosis not present

## 2021-12-02 DIAGNOSIS — Z131 Encounter for screening for diabetes mellitus: Secondary | ICD-10-CM | POA: Diagnosis not present

## 2021-12-02 DIAGNOSIS — I480 Paroxysmal atrial fibrillation: Secondary | ICD-10-CM

## 2021-12-02 DIAGNOSIS — F329 Major depressive disorder, single episode, unspecified: Secondary | ICD-10-CM

## 2021-12-02 NOTE — Patient Instructions (Addendum)
Medication Instructions:  Your physician recommends that you continue on your current medications as directed. Please refer to the Current Medication list given to you today.  *If you need a refill on your cardiac medications before your next appointment, please call your pharmacy*   Lab Work: Your physician recommends that you have the following labs drawn today: CMET, Magnesium, and Hemoglobin A1c. Please return Monday 12/13/2021 to our office to have the following labs drawn: CBC and BMET.  If you have labs (blood work) drawn today and your tests are completely normal, you will receive your results only by: MyChart Message (if you have MyChart) OR A paper copy in the mail If you have any lab test that is abnormal or we need to change your treatment, we will call you to review the results.   Testing/Procedures: Your physician has requested that you have a Cardioversion. Electrical Cardioversion uses a jolt of electricity to your heart either through paddles or wired patches attached to your chest. This is a controlled, usually prescheduled, procedure. This procedure is done at the hospital and you are not awake during the procedure. You usually go home the day of the procedure. Please see the instruction sheet given to you today for more information.  Your physician has recommended that you have a sleep study. This test records several body functions during sleep, including: brain activity, eye movement, oxygen and carbon dioxide blood levels, heart rate and rhythm, breathing rate and rhythm, the flow of air through your mouth and nose, snoring, body muscle movements, and chest and belly movement.    Follow-Up: At [facility], you and your health needs are our priority.  As part of our continuing mission to provide you with exceptional heart care, we have created designated Provider Care Teams.  These Care Teams include your primary Cardiologist (physician) and Advanced Practice  Providers (APPs -  Physician Assistants and Nurse Practitioners) who all work together to provide you with the care you need, when you need it.  We recommend signing up for the patient portal called "MyChart".  Sign up information is provided on this After Visit Summary.  MyChart is used to connect with patients for Virtual Visits (Telemedicine).  Patients are able to view lab/test results, encounter notes, upcoming appointments, etc.  Non-urgent messages can be sent to your provider as well.   To learn more about what you can do with MyChart, go to [email or web address].    Your next appointment:   3 month(s)  The format for your next appointment:   In Person  Provider:   Thomasene Ripple, DO     Other Instructions    Dear Christine Jacobson  You are scheduled for a Cardioversion on Friday, December 1 with Dr. Chilton Si, MD.  Please arrive at the [facility] (Main Entrance A) at [facility]: [address] [place], [place] [address] at 9:30 AM.    DIET:  Nothing to eat or drink after midnight except a sip of water with medications (see medication instructions below)  MEDICATION INSTRUCTIONS: Continue taking your anticoagulant (blood thinner): Apixaban (Eliquis).  You will need to continue this after your procedure until you are told by your provider that it is safe to stop.    LABS:  Come to [address] on Monday 12/13/2021 between the hours of 8am-4:30pm to have your labs drawn.   FYI:  For your safety, and to allow [place] to monitor your vital signs accurately during the surgery/procedure  we request: If you have artificial nails, gel coating, SNS etc, please have those removed prior to your surgery/procedure. Not having the nail coverings /polish removed may result in cancellation or delay of your surgery/procedure.  You must have a responsible person to drive you home and stay in the waiting area during your procedure. Failure to do so could result in  cancellation.  Bring your insurance cards.  *Special Note: Every effort is made to have your procedure done on time. Occasionally there are emergencies that occur at the hospital that may cause delays. Please be patient if a delay does occur.      Important Information About Sugar

## 2021-12-02 NOTE — Progress Notes (Signed)
JULISSIA, ACEBEDO ([id number]) [id number] Page 1 of 8 Visit Report for 12/01/2021 Chief Complaint Document Details Patient Name: Date of Service: ASHAUNTE, LONER 12/01/2021 8:45 A M Medical Record Number: [id number] Patient Account Number: [id number] Date of Birth/Sex: Treating RN: 03-24-1955 (66 y.o. F) Primary Care Provider: PA Haig Prophet, NO Other Clinician: Referring Provider: Treating Provider/Extender: Elizebeth Koller in Treatment: 3 Information Obtained from: Patient Chief Complaint 11/04/2021; patient is here for review of wounds on the left lower leg referred by Dr. Linus Salmons Electronic Signature(s) Signed: 12/01/2021 9:27:21 AM By: Fredirick Maudlin MD FACS Entered By: Fredirick Maudlin on 12/01/2021 09:27:21 -------------------------------------------------------------------------------- Debridement Details Patient Name: Date of Service: FATUMA, CANIZALES NNIE 12/01/2021 8:45 A M Medical Record Number: [id number] Patient Account Number: [id number] Date of Birth/Sex: Treating RN: 1955/02/15 (65 y.o. F) Zochol, Akeley Primary Care Provider: PA Haig Prophet, [place] Other Clinician: Referring Provider: Treating Provider/Extender: Elizebeth Koller in Treatment: 3 Debridement Performed for Assessment: Wound #1 Left,Posterior Lower Leg Performed By: Physician Fredirick Maudlin, MD Debridement Type: Debridement Severity of Tissue Pre Debridement: Fat layer exposed Level of Consciousness (Pre-procedure): Awake and Alert Pre-procedure Verification/Time Out Yes - 09:23 Taken: Start Time: 09:24 T Area Debrided (L x W): otal 5.4 (cm) x 4.5 (cm) = 24.3 (cm) Tissue and other material debrided: Non-Viable, Slough, Slough Level: Non-Viable Tissue Debridement Description: Selective/Open Wound Instrument: Curette Bleeding: Minimum Hemostasis Achieved: Pressure Response to Treatment: Procedure was tolerated well Level of Consciousness (Post-  Awake and Alert procedure): Post Debridement Measurements of Total Wound Length: (cm) 5.4 Width: (cm) 4.5 Depth: (cm) 0.1 Volume: (cm) 1.909 Character of Wound/Ulcer Post Debridement: Requires Further Debridement Severity of Tissue Post Debridement: Fat layer exposed Post Procedure Diagnosis Same as Pre-procedure Splawn, Ellise ([id number] Page 2 of 8 Notes Scribed for Dr. Celine Ahr by Blanche East, RN Electronic Signature(s) Signed: 12/01/2021 9:48:09 AM By: Fredirick Maudlin MD FACS Signed: 12/01/2021 5:14:53 PM By: Blanche East RN Entered By: Blanche East on 12/01/2021 09:26:57 -------------------------------------------------------------------------------- HPI Details Patient Name: Date of Service: GREYDI, STEGNER NNIE 12/01/2021 8:45 A M Medical Record Number: [id number] Patient Account Number: [id number] Date of Birth/Sex: Treating RN: [birth year] (66 y.o. F) Primary Care Provider: PA Haig Prophet, NO Other Clinician: Referring Provider: Treating Provider/Extender: Elizebeth Koller in Treatment: 3 History of Present Illness HPI Description: ADMISSION 11/04/2021 This is a 66 year old woman who comes into the clinic for review of wounds on the left leg. This includes several small areas on the left lateral lower leg as well as a substantial area on the left posterior calf. The patient was referred by Dr. Linus Salmons of infectious disease. She says that this began in the end of September when she developed lower extremity swelling and pain. She was seen in urgent care and put on Dalbvancin. However she required admission from 9/21 through 9/27 and was treated with ceftriaxone outpatient cefadroxil. A CT scan of the leg was negative for abscess. She was referred as an outpatient to Dr. Arelia Longest who saw her on October 10 did not feel she had any additional infectious issues and referred her here. 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 This required biopsy. She has been using a cream she had on the wounds on her left leg left over from that timeframe. Past medical history includes recent diagnosis of chronic diastolic  heart failure, paroxysmal A-fib on Eliquis, HOCM/ASH, hypertension and a history of pyoderma that is a self-report. ABI in this clinic was 0.63. The patient is a non-smoker she is not a known diabetic 11/11/2021: The patient did not go for her vascular studies. She says she did not understand what she was being called about. I spent some time explaining to her the value of those tests for her wound healing. Today, the 2 wounds have merged into 1. There is some slough on the wound surface and it remains extremely tender. No malodor or purulent drainage. 12/01/2021: The culture that I took at her last visit returned with a polymicrobial population including Proteus mirabilis, Serratia marcescens, and Pseudomonas aeruginosa. I sent in a prescription for levofloxacin and she completed a 10-day course. Unfortunately, she has been having some cardiac issues and did not return to clinic. She was most recently seen [facility] and they removed her wrap. Her leg is tremendously swollen today. She has been scratching at it and there are new excoriation wounds on her anterior tibial surface. The posterior leg wound actually looks better. There is some slough on the surface and it remains fairly tender. Unfortunately, she is unable to afford the [facility] antibiotic compound. Electronic Signature(s) Signed: 12/01/2021 9:28:59 AM By: Fredirick Maudlin MD FACS Entered By: Fredirick Maudlin on 12/01/2021 09:28:59 -------------------------------------------------------------------------------- Physical Exam Details Patient Name: Date of Service: JAKEA, WILLETT NNIE 12/01/2021  8:45 A M Medical Record Number: [id number] Patient Account Number: [id number] Date of Birth/Sex: Treating RN: 22-Jun-1955 (66 y.o. F) Primary Care Provider: PA Haig Prophet, NO Other Clinician: Referring Provider: Treating Provider/Extender: Elizebeth Koller in Treatment: 3 Constitutional Peyton, Shanea ([id number]) [id number] Page 3 of 8 . . . . No acute distress.Marland Kitchen Respiratory Normal work of breathing on room air.. Notes 12/01/2021: Her leg is tremendously swollen today. She has been scratching at it and there are new excoriation wounds on her anterior tibial surface. The posterior leg wound actually looks better. There is some slough on the surface and it remains fairly tender. Electronic Signature(s) Signed: 12/01/2021 9:29:39 AM By: Fredirick Maudlin MD FACS Entered By: Fredirick Maudlin on 12/01/2021 09:29:38 -------------------------------------------------------------------------------- Physician Orders Details Patient Name: Date of Service: CAELEE, EMMERT NNIE 12/01/2021 8:45 A M Medical Record Number: [id number] Patient Account Number: [id number] Date of Birth/Sex: Treating RN: [birth year] (66 y.o. 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 - Dr. Celine Ahr rm 3 Return A Wednesday 12/15/21 at 8:45 AM Nurse Visit: - will call back Anesthetic Wound #1 Left,Posterior Lower Leg (In clinic) Topical Lidocaine 5% applied to wound bed Bathing/ Shower/ Hygiene May shower with protection  but do not get wound dressing(s) wet. - purchase cast protector from [facility], [facility] or [employer] Edema Control - Lymphedema / SCD / Other Bilateral Lower Extremities Elevate legs to the level of the heart or above for 30 minutes daily and/or when sitting, a frequency of: Avoid standing for long periods of time. Exercise regularly - walking daily Moisturize legs daily. - right leg Wound Treatment Wound #1 - Lower Leg Wound Laterality: Left, Posterior Cleanser: Soap and Water 1 x Per Week/30 Days  Discharge Instructions: May shower and wash wound with dial antibacterial soap and water prior to dressing change. Peri-Wound Care: Triamcinolone 15 (g) 1 x Per Week/30 Days Discharge Instructions: Use triamcinolone 15 (g) as directed Peri-Wound Care: Sween Lotion (Moisturizing lotion) 1 x Per Week/30 Days Discharge Instructions: Apply moisturizing lotion as directed Prim Dressing: IODOFLEX 0.9% Cadexomer Iodine Pad 4x6 cm 1 x Per Week/30 Days ary Discharge Instructions: Apply to wound bed as instructed Secondary Dressing: ABD Pad, 8x10 1 x Per Week/30 Days Discharge Instructions: Apply over primary dressing as directed. EDRA, BIGGINS ([id number]) [id number] Page 4 of 8 Secondary Dressing: Woven Gauze Sponge, Non-Sterile 4x4 in 1 x Per Week/30 Days Discharge Instructions: Apply over primary dressing as directed. Secured With: Coban Self-Adherent Wrap 4x5 (in/yd) 1 x Per Week/30 Days Discharge Instructions: Secure with Coban as directed. Secured With: [employer], 4.5x3.1 (in/yd) 1 x Per Week/30 Days Discharge Instructions: Secure with Kerlix as directed. Electronic Signature(s) Signed: 12/01/2021 9:48:09 AM By: Fredirick Maudlin MD FACS Entered By: Fredirick Maudlin on 12/01/2021 09:29:51 -------------------------------------------------------------------------------- Problem List Details Patient Name: Date of Service: JEANE, GUILBERT NNIE 12/01/2021 8:45 A  M Medical Record Number: [id number] Patient Account Number: [id number] Date of Birth/Sex: Treating RN: [birth year] (66 y.o. F) Primary Care Provider: PA Haig Prophet, NO Other Clinician: Referring Provider: Treating Provider/Extender: Elizebeth Koller in Treatment: 3 Active Problems ICD-10 Encounter Code Description Active Date MDM Diagnosis I87.332 Chronic venous hypertension (idiopathic) with ulcer and inflammation of left 11/04/2021 No Yes lower extremity L97.228 Non-pressure chronic ulcer of left calf with other specified severity 11/04/2021 No Yes L03.116 Cellulitis of left lower limb 11/04/2021 No Yes Inactive Problems Resolved Problems Electronic Signature(s) Signed: 12/01/2021 9:27:07 AM By: Fredirick Maudlin MD FACS Entered By: Fredirick Maudlin on 12/01/2021 09:27:07 -------------------------------------------------------------------------------- Progress Note Details Patient Name: Date of Service: MICHAE, PLYMIRE NNIE 12/01/2021 8:45 A M Medical Record Number: [id number] Patient Account Number: [id number] Date of Birth/Sex: Treating RN: September 01, 1955 (66 y.o. F) Primary Care Provider: PA Haig Prophet, NO Other Clinician: Referring Provider: Treating Provider/Extender: Laverle Hobby Hindman, Tangee ([id number]) [id number] Page 5 of 8 Weeks in Treatment: 3 Subjective Chief Complaint Information obtained from Patient 11/04/2021; patient is here for review of wounds on the left lower leg referred by Dr. Linus Salmons History of Present Illness (HPI) ADMISSION 11/04/2021 This is a 66 year old woman who comes into the clinic for review of wounds on the left leg. This includes several small areas on the left lateral lower leg as well as a substantial area on the left posterior calf. The patient was referred by Dr. Linus Salmons of infectious disease. She says that this began in the end of September when she developed lower extremity swelling and  pain. She was seen in urgent care and put on Dalbvancin. However she required admission from 9/21 through 9/27 and was treated with ceftriaxone outpatient cefadroxil. A CT scan of the leg was negative for abscess. She was referred as an outpatient to Dr. Arelia Longest who saw her on October 10 did not feel she had any additional infectious issues and referred her here. 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 She has been using a cream she had on the wounds on her left leg left over from that timeframe. Past medical history includes recent diagnosis of chronic diastolic heart failure, paroxysmal  A-fib on Eliquis, HOCM/ASH, hypertension and a history of pyoderma that is a self-report. ABI in this clinic was 0.63. The patient is a non-smoker she is not a known diabetic 11/11/2021: The patient did not go for her vascular studies. She says she did not understand what she was being called about. I spent some time explaining to her the value of those tests for her wound healing. Today, the 2 wounds have merged into 1. There is some slough on the wound surface and it remains extremely tender. No malodor or purulent drainage. 12/01/2021: The culture that I took at her last visit returned with a polymicrobial population including Proteus mirabilis, Serratia marcescens, and Pseudomonas aeruginosa. I sent in a prescription for levofloxacin and she completed a 10-day course. Unfortunately, she has been having some cardiac issues and did not return to clinic. She was most recently seen [facility] and they removed her wrap. Her leg is tremendously swollen today. She has been scratching at it and there are new excoriation wounds on her anterior tibial surface. The posterior leg wound actually looks better. There is some slough on the  surface and it remains fairly tender. Unfortunately, she is unable to afford the [facility] antibiotic compound. Patient History Information obtained from Patient. Family History Cancer - Mother, Diabetes - Maternal Grandparents, Hypertension - Father, Lung Disease - Father, Stroke - Maternal Grandparents. Social History Former smoker - quit 20+ years ago, Marital Status - Single, Alcohol Use - Never, Drug Use - No History, Caffeine Use - Never. Medical History Hematologic/Lymphatic Patient has history of Lymphedema Cardiovascular Patient has history of Congestive Heart Failure, Hypertension Hospitalization/Surgery History - heart cath 10/15/21. Medical A Surgical History Notes nd Cardiovascular cardiomyopathy hyperlipidemia Psychiatric depression/anxiety Objective Constitutional No acute distress.. Vitals Time Taken: 8:57 AM, Height: 64 in, Weight: 203 lbs, BMI: 34.8, Temperature: 98.8 F, Pulse: 62 bpm, Respiratory Rate: 20 breaths/min, Blood Pressure: 116/72 mmHg, Pulse Oximetry: 96 %. Respiratory Normal work of breathing on room air.. General Notes: 12/01/2021: Her leg is tremendously swollen today. She has been scratching at it and there are new excoriation wounds on her anterior tibial surface. The posterior leg wound actually looks better. There is some slough on the surface and it remains fairly tender. Integumentary (Hair, Skin) Wound #1 status is Open. Original cause of wound was Gradually Appeared. The date acquired was: 08/17/2021. The wound has been in treatment 3 weeks. The [place], Kilynn ([id number]) [id number] Page 6 of 8 wound is located on the Left,Posterior Lower Leg. The wound measures 5.4cm length x 4.5cm width x 0.1cm depth; 19.085cm^2 area and 1.909cm^3 volume. There is Fat Layer (Subcutaneous Tissue) exposed. There is no tunneling or undermining noted. There is a medium amount of serosanguineous drainage noted. There is medium (34-66%)  pink granulation within the wound bed. There is a medium (34-66%) amount of necrotic tissue within the wound bed including Adherent Slough. The periwound skin appearance exhibited: Dry/Scaly, Maceration, Erythema. The surrounding wound skin color is noted with erythema which is circumferential. Periwound temperature was noted as No Abnormality. The periwound has tenderness on palpation. Assessment Active Problems ICD-10 Chronic venous hypertension (idiopathic) with ulcer and inflammation of left lower extremity Non-pressure chronic ulcer of left calf with other specified severity Cellulitis of left lower limb Procedures Wound #1 Pre-procedure diagnosis of Wound #1 is a Venous Leg Ulcer located on the Left,Posterior Lower Leg .Severity of Tissue Pre Debridement is: Fat layer exposed. There was a Selective/Open Wound Non-Viable Tissue Debridement with a  total area of 24.3 sq cm performed by Fredirick Maudlin, MD. With the following instrument(s): Curette to remove Non-Viable tissue/material. Material removed includes [facility]. No specimens were taken. A time out was conducted at 09:23, prior to the start of the procedure. A Minimum amount of bleeding was controlled with Pressure. The procedure was tolerated well. Post Debridement Measurements: 5.4cm length x 4.5cm width x 0.1cm depth; 1.909cm^3 volume. Character of Wound/Ulcer Post Debridement requires further debridement. Severity of Tissue Post Debridement is: Fat layer exposed. Post procedure Diagnosis Wound #1: Same as Pre-Procedure General Notes: Scribed for Dr. Celine Ahr by Blanche East, RN. Plan Follow-up Appointments: Return Appointment in 2 weeks. - Dr. Celine Ahr rm 3 Wednesday 12/15/21 at 8:45 AM Nurse Visit: - will call back Anesthetic: Wound #1 Left,Posterior Lower Leg: (In clinic) Topical Lidocaine 5% applied to wound bed Bathing/ Shower/ Hygiene: May shower with protection but do not get wound dressing(s) wet. - purchase cast protector  from [facility], [facility] or [employer] Edema Control - Lymphedema / SCD / Other: Elevate legs to the level of the heart or above for 30 minutes daily and/or when sitting, a frequency of: Avoid standing for long periods of time. Exercise regularly - walking daily Moisturize legs daily. - right leg WOUND #1: - Lower Leg Wound Laterality: Left, Posterior Cleanser: Soap and Water 1 x Per Week/30 Days Discharge Instructions: May shower and wash wound with dial antibacterial soap and water prior to dressing change. Peri-Wound Care: Triamcinolone 15 (g) 1 x Per Week/30 Days Discharge Instructions: Use triamcinolone 15 (g) as directed Peri-Wound Care: Sween Lotion (Moisturizing lotion) 1 x Per Week/30 Days Discharge Instructions: Apply moisturizing lotion as directed Prim Dressing: IODOFLEX 0.9% Cadexomer Iodine Pad 4x6 cm 1 x Per Week/30 Days ary Discharge Instructions: Apply to wound bed as instructed Secondary Dressing: ABD Pad, 8x10 1 x Per Week/30 Days Discharge Instructions: Apply over primary dressing as directed. Secondary Dressing: Woven Gauze Sponge, Non-Sterile 4x4 in 1 x Per Week/30 Days Discharge Instructions: Apply over primary dressing as directed. Secured With: Coban Self-Adherent Wrap 4x5 (in/yd) 1 x Per Week/30 Days Discharge Instructions: Secure with Coban as directed. Secured With: [employer], 4.5x3.1 (in/yd) 1 x Per Week/30 Days Discharge Instructions: Secure with Kerlix as directed. 12/01/2021: Her leg is tremendously swollen today. She has been scratching at it and there are new excoriation wounds on her anterior tibial surface. The posterior leg wound actually looks better. There is some slough on the surface and it remains fairly tender. I used a curette to debride slough from the posterior leg wound. We will apply topical gentamicin with silver alginate to the wound. If we have it in stock, we will use the calamine impregnated Coflex 2 layer wrap; if not, we will apply a  regular calamine based Unna boot. Follow-up in 1 week. Electronic Signature(s) Signed: 12/01/2021 9:30:34 AM By: Fredirick Maudlin MD FACS [place], [name] ([id number]) [id number] Page 7 of 8 Entered By: Fredirick Maudlin on 12/01/2021 09:30:34 -------------------------------------------------------------------------------- HxROS Details Patient Name: Date of Service: IJAH, VALOIS 12/01/2021 8:45 A M Medical Record Number: [id number] Patient Account Number: [id number] Date of Birth/Sex: Treating RN: Nov 16, 1955 (66 y.o. F) Primary Care Provider: PA Haig Prophet, NO Other Clinician: Referring Provider: Treating Provider/Extender: Elizebeth Koller in Treatment: 3 Information Obtained From Patient Hematologic/Lymphatic Medical History: Positive for: Lymphedema Cardiovascular Medical History: Positive for: Congestive Heart Failure; Hypertension Past Medical History Notes: cardiomyopathy hyperlipidemia Psychiatric Medical History: Past Medical History Notes: depression/anxiety Immunizations Pneumococcal Vaccine: Received Pneumococcal Vaccination: No  Implantable Devices None Hospitalization / Surgery History Type of Hospitalization/Surgery heart cath 10/15/21 Family and Social History Cancer: Yes - Mother; Diabetes: Yes - Maternal Grandparents; Hypertension: Yes - Father; Lung Disease: Yes - Father; Stroke: Yes - Maternal Grandparents; Former smoker - quit 20+ years ago; Marital Status - Single; Alcohol Use: Never; Drug Use: No History; Caffeine Use: Never; Financial Concerns: No; Food, Clothing or Shelter Needs: No; Support System Lacking: Yes - lives alone; Transportation Concerns: No Electronic Signature(s) Signed: 12/01/2021 9:48:09 AM By: Duanne Guess MD FACS Entered By: Duanne Guess on 12/01/2021 09:29:06 -------------------------------------------------------------------------------- SuperBill Details Patient Name: Date of  Service: AHNA, KONKLE NNIE 12/01/2021 Medical Record Number: [id number] Patient Account Number: [id number] Date of Birth/Sex: Treating RN: 1955-07-11 (67 y.o. F) Primary Care Provider: PA Fidela [place] Other ClinicianKILLIAN, RESS ([id number]) [id number] Page 8 of 8 Referring Provider: Treating Provider/Extender: Daralene Milch in Treatment: 3 Diagnosis Coding ICD-10 Codes Code Description [phone] Chronic venous hypertension (idiopathic) with ulcer and inflammation of left lower extremity L97.228 Non-pressure chronic ulcer of left calf with other specified severity L03.116 Cellulitis of left lower limb Facility Procedures : CPT4 Code: [id number] Description: 97597 - DEBRIDE WOUND 1ST 20 SQ CM OR < ICD-10 Diagnosis Description L97.228 Non-pressure chronic ulcer of left calf with other specified severity Modifier: Quantity: 1 : CPT4 Code: [id number] Description: 97598 - DEBRIDE WOUND EA ADDL 20 SQ CM ICD-10 Diagnosis Description L97.228 Non-pressure chronic ulcer of left calf with other specified severity Modifier: Quantity: 1 Physician Procedures : CPT4 Code Description Modifier [id number] 99214 - WC PHYS LEVEL 4 - EST PT 25 ICD-10 Diagnosis Description L97.228 Non-pressure chronic ulcer of left calf with other specified severity L03.116 Cellulitis of left lower limb I87.332 Chronic venous  hypertension (idiopathic) with ulcer and inflammation of left lower extremity Quantity: 1 : [phone] 97597 - WC PHYS DEBR WO ANESTH 20 SQ CM ICD-10 Diagnosis Description L97.228 Non-pressure chronic ulcer of left calf with other specified severity Quantity: 1 : [id number] 97598 - WC PHYS DEBR WO ANESTH EA ADD 20 CM ICD-10 Diagnosis Description L97.228 Non-pressure chronic ulcer of left calf with other specified severity Quantity: 1 Electronic Signature(s) Signed: 12/01/2021 9:32:32 AM By: Duanne Guess MD FACS Entered By: Duanne Guess on 12/01/2021  09:32:31

## 2021-12-02 NOTE — Progress Notes (Signed)
Cardiology Office Note:    Date:  12/02/2021   ID:  Christine Jacobson, DOB 09-04-1955, MRN [id number]  PCP:  Patient, No Pcp Per  Cardiologist:  Berniece Salines, DO  Electrophysiologist:  None   Referring MD: Olen Cordial, PA   " I am happy to meet you"  History of Present Illness:    Christine Jacobson is a 66 y.o. female with a hx of obesity, hypertrophic cardiomyopathy with intracavity gradient of 64 mmHg which increased with Valsalva, chronic diastolic heart failure grade 2 diastolic dysfunction, paroxysmal atrial fibrillation, hypertension, hypercholesteremia, today as a new patient.  The patient previously followed with [facility] cardiology and was last seen by Dr. Adrian Prows January on October 13, 2021 at that time per his note they had multiple times discuss the fact that she is continue to have leg swelling.  He even the visit prior has sent her to the emergency department on 923 where she was discharged home after ED evaluation.  Per his note the patient fell in the office while trying to walk to check out this was called and she was directly taken to the emergency department.  It appears that some of the lab work that had been requested the patient had not follow-up to get these.  In addition there is a sleep apnea testing that was recommended as part of her A-fib work-up the patient has not been able to get any of these done.  Today in the office for the first time I am meeting the patient.  She tells me she is transitioning care to our office she was referred by her new primary care team.  I explained to the patient that I review her previous medical records and it appears that her previous cardiologist had done very well by optimizing her medications.  In her follow-ups as well.  Explained to the patient that the care that she got was optimal and not subpar. she notes that she prefers to start with a new cardiologist.  In the middle of her talking to me she started to cry.  She was  tearing when I telling me her story and that she is short of breath. One of her biggest thing is that she feels that medications has been thrown to her.  But I explained to the patient that all these medications that she is on are not necessary and actually needed to help with her medical treatment.  She is short of breath that is getting worse.  And she has had some intermittent dizziness.  No chest pain.  She also does have some leg swelling and has been following with the wound clinic she did have dressing applied yesterday.  Past Medical History:  Diagnosis Date   Chest pain 11/22/2010   2D STRESS ECHO - EF 60%, peak stress EF 80%, normal, no evidence for stress-induced ischemia   HOCM (hypertrophic obstructive cardiomyopathy) ([place]) 06/21/2011   2D ECHO - EF >55%, normal   HTN (hypertension)    Hyperprolactinemia (HCC)    dx in her 18, took meds, self d/c a while back   Liver function test abnormality    normal when repeated   Mild hyperlipidemia    Obesity    Palpitations    negative stress echo in November 2012 with normal LV function; mild LVH, proximal septal thickening with narrow LVOT and mild gradient; mild MR and TR; Cardionet showed PACs in November 2012   Pyoderma gangrenosa    Shortness of breath 07/11/2011  MET TEST    Past Surgical History:  Procedure Laterality Date   RIGHT HEART CATH N/A 10/15/2021   Procedure: RIGHT HEART CATH;  Surgeon: Nigel Mormon, MD;  Location: [facility];  Service: Cardiovascular;  Laterality: N/A;   SKIN GRAFT  2006   porcine, R leg    Current Medications: Current Meds  Medication Sig   apixaban (ELIQUIS) 5 MG TABS tablet Take 1 tablet (5 mg total) by mouth 2 (two) times daily.   diltiazem (CARDIZEM CD) 180 MG 24 hr capsule Take 1 capsule (180 mg total) by mouth in the morning and at bedtime.   IBUPROFEN PO Take by mouth as needed.   metoprolol succinate (TOPROL-XL) 100 MG 24 hr tablet TAKE ONE TABLET BY MOUTH TWICE A DAY  WITH OR IMMEDIATELY FOLLOWING A MEAL (Patient taking differently: Take 100 mg by mouth in the morning and at bedtime.)   potassium chloride SA (KLOR-CON M) 20 MEQ tablet Take 1 tablet (20 mEq total) by mouth 2 (two) times daily.   predniSONE (DELTASONE) 20 MG tablet Take 20 mg by mouth 2 (two) times daily.   sertraline (ZOLOFT) 50 MG tablet Take 1 tablet (50 mg total) by mouth at bedtime.   torsemide (DEMADEX) 20 MG tablet Take 1 tablet (20 mg total) by mouth 2 (two) times daily at 10 am and 4 pm.     Allergies:   Patient has no known allergies.   Social History   Socioeconomic History   Marital status: Single    Spouse name: Not on file   Number of children: 0   Years of education: Not on file   Highest education level: Not on file  Occupational History   Occupation: para Statistician, part time cook  Tobacco Use   Smoking status: Former    Packs/day: 1.00    Years: 4.00    Total pack years: 4.00    Types: Cigarettes   Smokeless tobacco: Never  Vaping Use   Vaping Use: Never used  Substance and Sexual Activity   Alcohol use: Not Currently    Alcohol/week: 1.0 standard drink of alcohol    Types: 1 Glasses of wine per week    Comment: "I do have my wine" every day, has cut down from 2 bottles at a time, currently 2-3 glasses   Drug use: No   Sexual activity: Not Currently  Other Topics Concern   Not on file  Social History Narrative   Lives by self   Social Determinants of Health   Financial Resource Strain: Not on file  Food Insecurity: Not on file  Transportation Needs: Not on file  Physical Activity: Not on file  Stress: Not on file  Social Connections: Not on file     Family History: The patient's family history includes Coronary artery disease in an other family member; Diabetes in an other family member; Emphysema in her father; Heart disease in her mother; Ovarian cancer in her mother. There is no history of Cancer.  ROS:   Review of Systems   Constitution: Negative for decreased appetite, fever and weight gain.  HENT: Negative for congestion, ear discharge, hoarse voice and sore throat.   Eyes: Negative for discharge, redness, vision loss in right eye and visual halos.  Cardiovascular: Negative for chest pain, dyspnea on exertion, leg swelling, orthopnea and palpitations.  Respiratory: Negative for cough, hemoptysis, shortness of breath and snoring.   Endocrine: Negative for heat intolerance and polyphagia.  Hematologic/Lymphatic: Negative for  bleeding problem. Does not bruise/bleed easily.  Skin: Negative for flushing, nail changes, rash and suspicious lesions.  Musculoskeletal: Negative for arthritis, joint pain, muscle cramps, myalgias, neck pain and stiffness.  Gastrointestinal: Negative for abdominal pain, bowel incontinence, diarrhea and excessive appetite.  Genitourinary: Negative for decreased libido, genital sores and incomplete emptying.  Neurological: Negative for brief paralysis, focal weakness, headaches and loss of balance.  Psychiatric/Behavioral: Negative for altered mental status, depression and suicidal ideas.  Allergic/Immunologic: Negative for HIV exposure and persistent infections.    EKGs/Labs/Other Studies Reviewed:    The following studies were reviewed today:   EKG:  The ekg ordered today demonstrates   PCV ECHOCARDIOGRAM COMPLETE 09/02/2021  Narrative Echocardiogram 08/17/2021: 1. Normal LV systolic function with visual EF 60-65%. Left ventricle cavity is normal in size. Hypertrophic cardiomyopathy. Severe concentric hypertrophy of the left ventricle. Normal global wall motion. Ungraded restrictive diastolic dysfunction, elevated LAP. 2. Left atrial cavity is severely dilated at 5.4 cm. 3. Right atrial cavity is moderately dilated. 4. Right ventricle cavity is mild to moderately dilated. Normal right ventricular function. 5. Mild (Grade I) mitral regurgitation. Systolic anterior motion of MV  noted. Moderate mitral valve posterior leaflet calcification. E-wave dominant mitral inflow. 6. Structurally normal tricuspid valve. Moderate tricuspid regurgitation. Moderate pulmonary hypertension. RVSP measures 45 mmHg. 7. IVC is dilated with respiratory variation. 9. Recommend TEE.  Compared to the study done on 07/29/2017 report, RV dilatation is new.  Otherwise no significant change.   PCV MYOCARDIAL PERFUSION WITH LEXISCAN 08/23/2021  Myocardial perfusion is normal. Overall LV systolic function is normal without regional wall motion abnormalities. Stress LV EF: 63%. Abnormal ECG stress, positive for ischemic changes in inferior leads. TID ratio 1.28, which is abnormal therefore cannot r/o balanced ischemia given positive EKG and TID. No previous exam available for comparison. High risk study.   Right heart catheterization 08/14/2021  Right Heart Pressures RA: 11 mmHg RV: 51/1 mmHg PA: 52/23 mmHg, mPAP 36 mmHg PCW: 17-20 mmHg  CO: 4.3 L/min CI: 2.2 L/min/m2     Recent Labs: 10/15/2021: Magnesium 2.0 11/23/2021: B Natriuretic Peptide 691.2; BUN 24; Creatinine, Ser 1.47; Hemoglobin 13.3; Platelets 208; Potassium 3.0; Sodium 143  Recent Lipid Panel    Component Value Date/Time   CHOL 232 (H) 10/14/2021 0042   TRIG 191 (H) 10/14/2021 0042   HDL 52 10/14/2021 0042   CHOLHDL 4.5 10/14/2021 0042   VLDL 38 10/14/2021 0042   LDLCALC 142 (H) 10/14/2021 0042   LDLDIRECT 121.8 05/26/2011 0928    Physical Exam:    VS:  BP 100/62   Pulse 67   Ht _0  (1.626 m)   Wt 205 lb (93 kg)   SpO2 93%   BMI 35.19 kg/m     Wt Readings from Last 3 Encounters:  12/02/21 205 lb (93 kg)  11/23/21 205 lb (93 kg)  10/29/21 205 lb 12.8 oz (93.4 kg)     GEN: Well nourished, well developed in no acute distress HEENT: Normal NECK: No JVD; No carotid bruits LYMPHATICS: No lymphadenopathy CARDIAC: S1S2 noted,RRR, no murmurs, rubs, gallops RESPIRATORY:  Clear to auscultation without rales,  wheezing or rhonchi  ABDOMEN: Soft, non-tender, non-distended, +bowel sounds, no guarding. EXTREMITIES: No edema, No cyanosis, no clubbing MUSCULOSKELETAL:  No deformity  SKIN: Warm and dry NEUROLOGIC:  Alert and oriented x 3, non-focal PSYCHIATRIC:  Normal affect, good insight  ASSESSMENT:    1. Medication management   2. Screening for diabetes mellitus (DM)   3. SOB (shortness  of breath)   4. Fatigue, unspecified type   5. PAF (paroxysmal atrial fibrillation) ([place])   6. Major depressive disorder with current active episode, unspecified depression episode severity, unspecified whether recurrent   7. Encounter for preprocedural laboratory examination    PLAN:     Dyspnea on exertion-this could be multifactorial but especially in the setting of atrial fibrillation and hypertrophic cardiomyopathy I explained to the patient that we should go forward to him for cardioversion to get her in sinus rhythm.  Hopefully this will help.  In the meantime she has been started on diuretics she takes that some days she tells me have asked her to continue to take her diuretics given her significant leg swelling and her recent right heart cath showed high wedge pressure.  We had to really talk about her medication plan.  Because she was off the mindset that she is on way too much medication that she need and I explained to the patient that all of the medications she is currently taking it as needed. Explained to her compliance with that will be very important.  She is agreeable to get the cardioversion.  But transportation will be an issue.  She lives alone and does not have any family or friends here in [place].  So I have sent information to our care navigation team to help the patient to get transportation for her procedure.  She does have major depressive disorder which I think is undertreated.  She needs to see behavioral health specialist.  I have referred the patient to our behavioral  specialist hopefully she will be willing to attend this.  He also needs a sleep study her previous cardiologist has advised the patient of this.  She had deferred.  I asked her about getting this done today she reluctantly said yes but I am not optimistic at this point.  I will order the sleep study.  The patient is in agreement with the above plan. The patient left the office in stable condition.  The patient will follow up in 3 months   Medication Adjustments/Labs and Tests Ordered: Current medicines are reviewed at length with the patient today.  Concerns regarding medicines are outlined above.  Orders Placed This Encounter  Procedures   Comprehensive metabolic panel   Magnesium   Hemoglobin A1c   CBC   Basic Metabolic Panel (BMET)   Ambulatory referral to Psychology   EKG 12-Lead   Split night study   No orders of the defined types were placed in this encounter.   Patient Instructions  Medication Instructions:  Your physician recommends that you continue on your current medications as directed. Please refer to the Current Medication list given to you today.  *If you need a refill on your cardiac medications before your next appointment, please call your pharmacy*   Lab Work: Your physician recommends that you have the following labs drawn today: CMET, Magnesium, and Hemoglobin A1c. Please return Monday 12/13/2021 to our office to have the following labs drawn: CBC and BMET.  If you have labs (blood work) drawn today and your tests are completely normal, you will receive your results only by: [place] (if you have MyChart) OR A paper copy in the mail If you have any lab test that is abnormal or we need to change your treatment, we will call you to review the results.   Testing/Procedures: Your physician has requested that you have a Cardioversion. Electrical Cardioversion uses a jolt of electricity to your  heart either through paddles or wired patches attached to your  chest. This is a controlled, usually prescheduled, procedure. This procedure is done at the hospital and you are not awake during the procedure. You usually go home the day of the procedure. Please see the instruction sheet given to you today for more information.  Your physician has recommended that you have a sleep study. This test records several body functions during sleep, including: brain activity, eye movement, oxygen and carbon dioxide blood levels, heart rate and rhythm, breathing rate and rhythm, the flow of air through your mouth and nose, snoring, body muscle movements, and chest and belly movement.    Follow-Up: At [facility], you and your health needs are our priority.  As part of our continuing mission to provide you with exceptional heart care, we have created designated Provider Care Teams.  These Care Teams include your primary Cardiologist (physician) and Advanced Practice Providers (APPs -  Physician Assistants and Nurse Practitioners) who all work together to provide you with the care you need, when you need it.  We recommend signing up for the patient portal called "MyChart".  Sign up information is provided on this After Visit Summary.  MyChart is used to connect with patients for Virtual Visits (Telemedicine).  Patients are able to view lab/test results, encounter notes, upcoming appointments, etc.  Non-urgent messages can be sent to your provider as well.   To learn more about what you can do with MyChart, go to [email or web address].    Your next appointment:   3 month(s)  The format for your next appointment:   In Person  Provider:   Berniece Salines, DO     Other Instructions    Dear Christine Jacobson  You are scheduled for a Cardioversion on Friday, December 1 with Dr. Skeet Latch, MD.  Please arrive at the [facility] (Main Entrance A) at [facility]: [place], [place] [address] at 9:30 AM.    DIET:  Nothing to eat or drink after  midnight except a sip of water with medications (see medication instructions below)  MEDICATION INSTRUCTIONS: Continue taking your anticoagulant (blood thinner): Apixaban (Eliquis).  You will need to continue this after your procedure until you are told by your provider that it is safe to stop.    LABS:  Come to [address] on Monday 12/13/2021 between the hours of 8am-4:30pm to have your labs drawn.   FYI:  For your safety, and to allow [place] to monitor your vital signs accurately during the surgery/procedure we request: If you have artificial nails, gel coating, SNS etc, please have those removed prior to your surgery/procedure. Not having the nail coverings /polish removed may result in cancellation or delay of your surgery/procedure.  You must have a responsible person to drive you home and stay in the waiting area during your procedure. Failure to do so could result in cancellation.  Bring your insurance cards.  *Special Note: Every effort is made to have your procedure done on time. Occasionally there are emergencies that occur at the hospital that may cause delays. Please be patient if a delay does occur.      Important Information About Sugar         Adopting a Healthy Lifestyle.  Know what a healthy weight is for you (roughly BMI <25) and aim to maintain this   Aim for 7+ servings of fruits and vegetables daily   65-80+ fluid ounces of water  or unsweet tea for healthy kidneys   Limit to max 1 drink of alcohol per day; avoid smoking/tobacco   Limit animal fats in diet for cholesterol and heart health - choose grass fed whenever available   Avoid highly processed foods, and foods high in saturated/trans fats   Aim for low stress - take time to unwind and care for your mental health   Aim for 150 min of moderate intensity exercise weekly for heart health, and weights twice weekly for bone health   Aim for 7-9 hours of sleep daily   When it comes to  diets, agreement about the perfect plan isnt easy to find, even among the experts. Experts at the [place] developed an idea known as the Healthy Eating Plate. Just imagine a plate divided into logical, healthy portions.   The emphasis is on diet quality:   Load up on vegetables and fruits - one-half of your plate: Aim for color and variety, and remember that potatoes dont count.   Go for whole grains - one-quarter of your plate: Whole wheat, barley, wheat berries, quinoa, oats, brown rice, and foods made with them. If you want pasta, go with whole wheat pasta.   Protein power - one-quarter of your plate: Fish, chicken, beans, and nuts are all healthy, versatile protein sources. Limit red meat.   The diet, however, does go beyond the plate, offering a few other suggestions.   Use healthy plant oils, such as olive, canola, soy, corn, sunflower and peanut. Check the labels, and avoid partially hydrogenated oil, which have unhealthy trans fats.   If youre thirsty, drink water. Coffee and tea are good in moderation, but skip sugary drinks and limit milk and dairy products to one or two daily servings.   The type of carbohydrate in the diet is more important than the amount. Some sources of carbohydrates, such as vegetables, fruits, whole grains, and beans-are healthier than others.   Finally, stay active  Signed, Berniece Salines, DO  12/02/2021 10:05 AM    [place]

## 2021-12-02 NOTE — Progress Notes (Signed)
DARRIN, LOBER ([id number]) [id number] Page 1 of 8 Visit Report for 12/01/2021 Arrival Information Details Patient Name: Date of Service: Christine Jacobson, Christine Jacobson 12/01/2021 8:45 A M Medical Record Number: [id number] Patient Account Number: [id number] Date of Birth/Sex: Treating RN: [birth year] (66 y.o. Marta Lamas Primary Care Krishna Heuer: PA [place], [place] Other Clinician: Referring Makynlee Kressin: Treating Brock Mokry/Extender: Elizebeth Koller in Treatment: 3 Visit Information History Since Last Visit Added or deleted any medications: No Patient Arrived: Wheel Chair Any new allergies or adverse reactions: No Arrival Time: 08:53 Had a fall or experienced change in No Accompanied By: self activities of daily living that may affect Transfer Assistance: None risk of falls: Patient Identification Verified: Yes Signs or symptoms of abuse/neglect since last visito No Secondary Verification Process Completed: Yes Hospitalized since last visit: Yes Patient Requires Transmission-Based Precautions: No Implantable device outside of the clinic excluding No Patient Has Alerts: No cellular tissue based products placed in the center since last visit: Has Dressing in Place as Prescribed: Yes Pain Present Now: No Electronic Signature(s) Signed: 12/01/2021 5:14:53 PM By: Blanche East RN Entered By: Blanche East on 12/01/2021 09:10:20 -------------------------------------------------------------------------------- Compression Therapy Details Patient Name: Date of Service: Christine Jacobson, Christine [name] 12/01/2021 8:45 A M Medical Record Number: [id number] Patient Account Number: [id number] Date of Birth/Sex: Treating RN: April 13, 1955 (66 y.o. Marta Lamas Primary Care Abdurahman Rugg: PA Haig Prophet, [place] Other Clinician: Referring Oneita Allmon: Treating Ksenia Kunz/Extender: Elizebeth Koller in Treatment: 3 Compression Therapy Performed for Wound Assessment: Wound #1  Left,Posterior Lower Leg Performed By: Clinician Blanche East, RN Compression Type: Rolena Infante Post Procedure Diagnosis Same as Pre-procedure Electronic Signature(s) Signed: 12/01/2021 5:14:53 PM By: Blanche East RN Entered By: Blanche East on 12/01/2021 09:31:48 Wilborn, [name] ([id number] Page 2 of 8 -------------------------------------------------------------------------------- Encounter Discharge Information Details Patient Name: Date of Service: Christine Jacobson, Christine Jacobson 12/01/2021 8:45 A M Medical Record Number: [id number] Patient Account Number: [id number] Date of Birth/Sex: Treating RN: Oct 18, 1955 (66 y.o. Marta Lamas Primary Care Anahla Bevis: PA Haig Prophet, [place] Other Clinician: Referring Ashby Moskal: Treating Amal Renbarger/Extender: Elizebeth Koller in Treatment: 3 Encounter Discharge Information Items Post Procedure Vitals Discharge Condition: Stable Temperature (F): 98.8 Ambulatory Status: Ambulatory Pulse (bpm): 62 Discharge Destination: Home Respiratory Rate (breaths/min): 20 Transportation: Private Auto Blood Pressure (mmHg): 116/72 Accompanied By: self Schedule Follow-up Appointment: Yes Clinical Summary of Care: Electronic Signature(s) Signed: 12/01/2021 5:14:53 PM By: Blanche East RN Entered By: Blanche East on 12/01/2021 09:32:50 -------------------------------------------------------------------------------- Lower Extremity Assessment Details Patient Name: Date of Service: Christine Jacobson, Christine Jacobson 12/01/2021 8:45 A M Medical Record Number: [id number] Patient Account Number: [id number] Date of Birth/Sex: Treating RN: [birth year] (66 y.o. Marta Lamas Primary Care Issachar Broady: PA Haig Prophet, [place] Other Clinician: Referring Curlie Macken: Treating Ronalee Scheunemann/Extender: Elizebeth Koller in Treatment: 3 Edema Assessment Assessed: [Left: No] [Right: No] [Left: Edema] [Right: :] Calf Left: Right: Point of Measurement: 32  cm From Medial Instep 46 cm 42 cm Ankle Left: Right: Point of Measurement: 10 cm From Medial Instep 30 cm 32 cm Vascular Assessment Pulses: Dorsalis Pedis Palpable: [Left:Yes] [Right:Yes] Electronic Signature(s) Signed: 12/01/2021 5:14:53 PM By: Blanche East RN Entered By: Blanche East on 12/01/2021 09:01:08 Multi Wound Chart Details -------------------------------------------------------------------------------- Christine Jacobson ([id number].pdf Page 3 of 8 Patient Name: Date of Service: Christine Jacobson, Christine Jacobson 12/01/2021 8:45 A M Medical Record Number: [id number] Patient Account Number: [id number] Date of Birth/Sex: Treating RN: 1955/05/09 (66 y.o. F) Primary Care Easton Sivertson: PA Haig Prophet, [place] Other Clinician: Referring Sariah Henkin: Treating Jaideep Pollack/Extender: Cardell Peach,  Wonda Cerise in Treatment: 3 Vital Signs Height(in): 64 Pulse(bpm): 62 Weight(lbs): 203 Blood Pressure(mmHg): 116/72 Body Mass Index(BMI): 34.8 Temperature(F): 98.8 Respiratory Rate(breaths/min): 20 Wound Assessments Wound Number: 1 N/A N/A Photos: N/A N/A Left, Posterior Lower Leg N/A N/A Wound Location: Gradually Appeared N/A N/A Wounding Event: Venous Leg Ulcer N/A N/A Primary Etiology: Lymphedema, Congestive Heart N/A N/A Comorbid History: Failure, Hypertension 08/17/2021 N/A N/A Date Acquired: 3 N/A N/A Weeks of Treatment: Open N/A N/A Wound Status: No N/A N/A Wound Recurrence: 5.4x4.5x0.1 N/A N/A Measurements L x W x D (cm) 19.085 N/A N/A A (cm) : rea 1.909 N/A N/A Volume (cm) : 61.40% N/A N/A % Reduction in A rea: 80.70% N/A N/A % Reduction in Volume: Full Thickness Without Exposed N/A N/A Classification: Support Structures Medium N/A N/A Exudate A mount: Serosanguineous N/A N/A Exudate Type: red, brown N/A N/A Exudate Color: Medium (34-66%) N/A N/A Granulation A mount: Pink N/A N/A Granulation Quality: Medium (34-66%) N/A N/A Necrotic A  mount: Fat Layer (Subcutaneous Tissue): Yes N/A N/A Exposed Structures: Fascia: No Tendon: No Muscle: No Joint: No Bone: No None N/A N/A Epithelialization: Debridement - Selective/Open Wound N/A N/A Debridement: Pre-procedure Verification/Time Out 09:23 N/A N/A Taken: Slough N/A N/A Tissue Debrided: Non-Viable Tissue N/A N/A Level: 24.3 N/A N/A Debridement A (sq cm): rea Curette N/A N/A Instrument: Minimum N/A N/A Bleeding: Pressure N/A N/A Hemostasis A chieved: Procedure was tolerated well N/A N/A Debridement Treatment Response: 5.4x4.5x0.1 N/A N/A Post Debridement Measurements L x W x D (cm) 1.909 N/A N/A Post Debridement Volume: (cm) Maceration: Yes N/A N/A Periwound Skin Moisture: Dry/Scaly: Yes Erythema: Yes N/A N/A Periwound Skin Color: Circumferential N/A N/A Erythema Location: No Abnormality N/A N/A Temperature: Yes N/A N/A Tenderness on Palpation: Debridement N/A N/A Procedures Performed: Treatment Notes Electronic Signature(s) Signed: 12/01/2021 9:27:14 AM By: Duanne Guess MD FACS [place], [name] ([id number]) [id number] Page 4 of 8 Signed: 12/01/2021 9:27:14 AM By: Duanne Guess MD FACS Entered By: Duanne Guess on 12/01/2021 09:27:13 -------------------------------------------------------------------------------- Multi-Disciplinary Care Plan Details Patient Name: Date of Service: Christine Jacobson, Christine Jacobson 12/01/2021 8:45 A M Medical Record Number: [id number] Patient Account Number: [id number] Date of Birth/Sex: Treating RN: 25-Dec-1955 (66 y.o. Kateri Mc Primary Care Zivah Mayr: PA Zenovia Jordan, [place] Other Clinician: Referring Tarvares Lant: Treating Dashanique Brownstein/Extender: Daralene Milch in Treatment: 3 Active Inactive Orientation to the Wound Care Program Nursing Diagnoses: Knowledge deficit related to the wound healing center program Goals: Patient/caregiver will verbalize understanding of the Wound  Healing Center Program Date Initiated: 11/04/2021 Target Resolution Date: 11/19/2021 Goal Status: Active Interventions: Provide education on orientation to the wound center Notes: Venous Leg Ulcer Nursing Diagnoses: Knowledge deficit related to disease process and management Goals: Patient will maintain optimal edema control Date Initiated: 11/04/2021 Target Resolution Date: 12/28/2021 Goal Status: Active Patient/caregiver will verbalize understanding of disease process and disease management Date Initiated: 11/04/2021 Target Resolution Date: 12/16/2021 Goal Status: Active Verify adequate tissue perfusion prior to therapeutic compression application Date Initiated: 11/04/2021 Target Resolution Date: 12/23/2021 Goal Status: Active Interventions: Assess peripheral edema status every visit. Compression as ordered Provide education on venous insufficiency Notes: Wound/Skin Impairment Nursing Diagnoses: Impaired tissue integrity Knowledge deficit related to ulceration/compromised skin integrity Goals: Patient/caregiver will verbalize understanding of skin care regimen Date Initiated: 11/04/2021 Target Resolution Date: 12/09/2021 Goal Status: Active Ulcer/skin breakdown will have a volume reduction of 30% by week 4 Date Initiated: 11/04/2021 Target Resolution Date: 12/02/2021 Goal Status: Active Interventions: Assess patient/caregiver ability to obtain necessary supplies Burton, Brenly ([id number]) [id number].pdf  Page 5 of 8 Assess patient/caregiver ability to perform ulcer/skin care regimen upon admission and as needed Assess ulceration(s) every visit Provide education on ulcer and skin care Notes: Electronic Signature(s) Signed: 12/01/2021 5:14:53 PM By: Blanche East RN Entered By: Blanche East on 12/01/2021 09:20:44 -------------------------------------------------------------------------------- Pain Assessment Details Patient Name: Date of  Service: Christine Jacobson, Christine Jacobson 12/01/2021 8:45 A M Medical Record Number: [id number] Patient Account Number: [id number] Date of Birth/Sex: Treating RN: [birth year] (66 y.o. Marta Lamas Primary Care Thomas Mabry: PA Haig [place], [place] Other Clinician: Referring Tiahna Cure: Treating Eugine Bubb/Extender: Elizebeth Koller in Treatment: 3 Active Problems Location of Pain Severity and Description of Pain Patient Has Paino No Site Locations Pain Management and Medication Current Pain Management: Electronic Signature(s) Signed: 12/01/2021 5:14:53 PM By: Blanche East RN Entered By: Blanche East on 12/01/2021 08:57:37 -------------------------------------------------------------------------------- Patient/Caregiver Education Details Patient Name: Date of Service: Christine [name] 11/15/2023andnbsp8:45 A M Medical Record Number: [id number] Patient Account Number: [id number] Date of Birth/Gender: Treating RN: 04-Jun-1955 (66 y.o. Marta Lamas Primary Care Physician: PA Haig Prophet, [place] Other Clinician: Referring Physician: Treating Physician/Extender: Elizebeth Koller in Treatment: [place], Rosaisela ([id number]) [id number] Page 6 of 8 Education Assessment Education Provided To: Patient Education Topics Provided Venous: Methods: Explain/Verbal Responses: Reinforcements needed, State content correctly Wound/Skin Impairment: Methods: Explain/Verbal Responses: Reinforcements needed, State content correctly Electronic Signature(s) Signed: 12/01/2021 5:14:53 PM By: Blanche East RN Entered By: Blanche East on 12/01/2021 09:28:58 -------------------------------------------------------------------------------- Wound Assessment Details Patient Name: Date of Service: Christine Jacobson, Christine Jacobson 12/01/2021 8:45 A M Medical Record Number: [id number] Patient Account Number: [id number] Date of Birth/Sex: Treating RN: 1955/09/12 (66 y.o. Iver Nestle, [name] Primary  Care Crosby Oriordan: PA Haig Prophet, [place] Other Clinician: Referring Diamantina Edinger: Treating Kamdon Reisig/Extender: Elizebeth Koller in Treatment: 3 Wound Status Wound Number: 1 Primary Etiology: Venous Leg Ulcer Wound Location: Left, Posterior Lower Leg Wound Status: Open Wounding Event: Gradually Appeared Notes: wound has combined with lateral wound Date Acquired: 08/17/2021 Comorbid History: Lymphedema, Congestive Heart Failure, Hypertension Weeks Of Treatment: 3 Clustered Wound: No Photos Wound Measurements Length: (cm) 5.4 Width: (cm) 4.5 Depth: (cm) 0.1 Area: (cm) 19.085 Volume: (cm) 1.909 % Reduction in Area: 61.4% % Reduction in Volume: 80.7% Epithelialization: None Tunneling: No Undermining: No Wound Description Classification: Full Thickness Without Exposed Support Exudate Amount: Medium Exudate Type: Serosanguineous Exudate Color: red, brown Christine Jacobson, Christine Jacobson ([id number]) Wound Bed Granulation Amount: Medium (34-66% Granulation Quality: Pink Necrotic Amount: Medium (34-66% Necrotic Quality: Adherent Sloug Structures Foul Odor After Cleansing: No Slough/Fibrino Yes [phone].pdf Page 7 of 8 ) Exposed Structure Fascia Exposed: No ) Fat Layer (Subcutaneous Tissue) Exposed: Yes h Tendon Exposed: No Muscle Exposed: No Joint Exposed: No Bone Exposed: No Periwound Skin Texture Texture Color No Abnormalities Noted: No No Abnormalities Noted: No Erythema: Yes Moisture Erythema Location: Circumferential No Abnormalities Noted: No Dry / Scaly: Yes Temperature / Pain Maceration: Yes Temperature: No Abnormality Tenderness on Palpation: Yes Treatment Notes Wound #1 (Lower Leg) Wound Laterality: Left, Posterior Cleanser Soap and Water Discharge Instruction: May shower and wash wound with dial antibacterial soap and water prior to dressing change. Peri-Wound Care Triamcinolone 15 (g) Discharge Instruction: Use triamcinolone 15 (g) as  directed Sween Lotion (Moisturizing lotion) Discharge Instruction: Apply moisturizing lotion as directed Topical Primary Dressing IODOFLEX 0.9% Cadexomer Iodine Pad 4x6 cm Discharge Instruction: Apply to wound bed as instructed Secondary Dressing ABD Pad, 8x10 Discharge Instruction: Apply over primary dressing as directed. Woven Gauze Sponge, Non-Sterile 4x4 in Discharge Instruction: Apply over primary  dressing as directed. Secured With [employer] 4x5 (in/yd) Discharge Instruction: Secure with Coban as directed. Kerlix Roll Sterile, 4.5x3.1 (in/yd) Discharge Instruction: Secure with Kerlix as directed. Compression Wrap Compression Stockings Add-Ons Electronic Signature(s) Signed: 12/01/2021 5:14:53 PM By: Blanche East RN Entered By: Blanche East on 12/01/2021 09:09:44 -------------------------------------------------------------------------------- Vitals Details Patient Name: Date of Service: Christine Jacobson, Christine Jacobson 12/01/2021 8:45 A M Medical Record Number: [id number] Patient Account Number: [id number] Date of Birth/Sex: Treating RN: Dec 07, 1955 (66 y.o. Iver Nestle, [facility] Orlandis Sanden: PA Darnelle Spangle Other ClinicianANGELEIGH, WURTH ([id number]) [id number] Page 8 of 8 Referring Xena Propst: Treating Evaan Tidwell/Extender: Elizebeth Koller in Treatment: 3 Vital Signs Time Taken: 08:57 Temperature (F): 98.8 Height (in): 64 Pulse (bpm): 62 Weight (lbs): 203 Respiratory Rate (breaths/min): 20 Body Mass Index (BMI): 34.8 Blood Pressure (mmHg): 116/72 Reference Range: 80 - 120 mg / dl Airway Pulse Oximetry (%): 96 Electronic Signature(s) Signed: 12/01/2021 5:14:53 PM By: Blanche East RN Entered By: Blanche East on 12/01/2021 08:58:54

## 2021-12-03 ENCOUNTER — Telehealth: Payer: Self-pay | Admitting: Licensed Clinical Social Worker

## 2021-12-03 LAB — COMPREHENSIVE METABOLIC PANEL
ALT: 62 IU/L — ABNORMAL HIGH (ref 0–32)
AST: 39 IU/L (ref 0–40)
Albumin/Globulin Ratio: 1.6 (ref 1.2–2.2)
Albumin: 3.9 g/dL (ref 3.9–4.9)
Alkaline Phosphatase: 374 IU/L — ABNORMAL HIGH (ref 44–121)
BUN/Creatinine Ratio: 16 (ref 12–28)
BUN: 21 mg/dL (ref 8–27)
Bilirubin Total: 1.2 mg/dL (ref 0.0–1.2)
CO2: 26 mmol/L (ref 20–29)
Calcium: 10.1 mg/dL (ref 8.7–10.3)
Chloride: 100 mmol/L (ref 96–106)
Creatinine, Ser: 1.28 mg/dL — ABNORMAL HIGH (ref 0.57–1.00)
Globulin, Total: 2.5 g/dL (ref 1.5–4.5)
Glucose: 110 mg/dL — ABNORMAL HIGH (ref 70–99)
Potassium: 4.6 mmol/L (ref 3.5–5.2)
Sodium: 138 mmol/L (ref 134–144)
Total Protein: 6.4 g/dL (ref 6.0–8.5)
eGFR: 46 mL/min/{1.73_m2} — ABNORMAL LOW (ref >59)

## 2021-12-03 LAB — HEMOGLOBIN A1C
Est. average glucose Bld gHb Est-mCnc: 120 mg/dL
Hgb A1c MFr Bld: 5.8 % — ABNORMAL HIGH (ref 4.8–5.6)

## 2021-12-03 LAB — MAGNESIUM: Magnesium: 2 mg/dL (ref 1.6–2.3)

## 2021-12-03 NOTE — Progress Notes (Signed)
Heart and Vascular Care Navigation  12/03/2021  Christine Jacobson 1955/10/16 [id number]  Reason for Referral: transportation issues Patient is participating in a Managed Medicaid Plan: No, UHC Medicare only.   Engaged with patient by telephone for initial visit for Heart and Vascular Care Coordination.                                                                                                   Assessment:   LCSW received a call back from pt. Introduced self, role, reason for call. Introduced self, role, reason for call. Pt confirmed home address, new PCP w/ [facility], and Emergency Contact. She shares that they did provide her with transportation resources during appt and she was able to locate a ride for appt on 12/1. She denies any issues with cost of living, obtaining/affording medications at this time. She is interested in possibly seeing if she is eligible for Medicaid. LCSW encouraged pt to call me if any questions/concerns arise including challenges w/ Transportation. Pt appreciative for call at this time.                                     HRT/VAS Care Coordination     Patients Home Cardiology Office [facility]   Outpatient Care Team Social Worker   Social Worker Name: Octavio Graves, [place], [phone]   Living arrangements for the past 2 months Single Family Home   Lives with: Self   Patient Current Insurance Coverage Managed Medicare   Patient Has Concern With Paying Medical Bills No   Does Patient Have Prescription Coverage? Yes       Social History:                                                                             SDOH Screenings   Food Insecurity: No Food Insecurity (12/03/2021)  Housing: Low Risk  (12/03/2021)  Transportation Needs: Unmet Transportation Needs (12/03/2021)  Utilities: Not At Risk (12/03/2021)  Depression (PHQ2-9): Low Risk  (10/26/2021)  Financial Resource Strain: Low Risk  (12/03/2021)  Tobacco Use: Medium Risk (12/02/2021)     SDOH Interventions: Financial Resources:  Financial Strain Interventions: Intervention Not Indicated DSS to assess for Medicaid eligibility  Food Insecurity:  Food Insecurity Interventions: Intervention Not Indicated  Housing Insecurity:  Housing Interventions: Intervention Not Indicated  Transportation:   Transportation Interventions: Payor Benefit, Patient Resources (Friends/Family)    Other Care Navigation Interventions:     Provided Pharmacy assistance resources  Pt states no issues with obtaining or affording her medications. I shared that pt could get them mailed through [facility] if eligible.    Follow-up plan:   LCSW mailed pt the following: my card and Medicaid enrollment information. LCSW will  f/u to ensure pt received resources and answer any additional questions/concerns.

## 2021-12-03 NOTE — Telephone Encounter (Signed)
H&V Care Navigation CSW Progress Note  Clinical Social Worker contacted patient by phone to f/u on assistance w/ Transportation noted during appt w/ Dr. Servando Salina. Pt was provided by [facility] with transportation resources locally. I attempted to reach pt today at [phone]. No answer, left voicemail requesting call back as able.   Patient is participating in a Managed Medicaid Plan:  No, UHC Medicare only.   SDOH Screenings   Depression (PHQ2-9): Low Risk  (10/26/2021)  Tobacco Use: Medium Risk (12/02/2021)    Octavio Graves, MSW, LCSW Clinical Social Worker II [facility] Heart/Vascular Care Navigation  [phone]- work cell phone (preferred) [phone]- desk phone

## 2021-12-13 ENCOUNTER — Telehealth: Payer: Self-pay | Admitting: Licensed Clinical Social Worker

## 2021-12-13 NOTE — Telephone Encounter (Signed)
H&V Care Navigation CSW Progress Note  Clinical Social Worker contacted patient by phone to return call from this weekend that pt left regarding resources and referrals. LCSW was able to reach her this morning at [phone]. Pt shres that she is just f/u on resources for Medicaid and eferrral for [facility] visit. I was able to find a referral to Dr. Marge Duncans office with [facility] and that she has an appt 12/12 at 8am but unclear about if it is on the phone or in person- I will assist pt with clarifying. I shared DSS number should pt want to reach out to DSS prior to receiving packet I mailed. Since it was mailed on 11/17 it may not have left office mail until Monday 11/20, pt states understanding.   Was able to confirm with [facility] that Dr. Bosie Clos will call pt 12/12 at 8am for intake. Pt aware of appt.   Patient is participating in a Managed Medicaid Plan:  No,  BCBS Medicare onle  SDOH Screenings   Food Insecurity: No Food Insecurity (12/03/2021)  Housing: Low Risk  (12/03/2021)  Transportation Needs: Unmet Transportation Needs (12/03/2021)  Utilities: Not At Risk (12/03/2021)  Depression (PHQ2-9): Low Risk  (10/26/2021)  Financial Resource Strain: Low Risk  (12/03/2021)  Tobacco Use: Medium Risk (12/02/2021)   Octavio Graves, MSW, LCSW Clinical Social Worker II [facility] Heart/Vascular Care Navigation  [phone]- work cell phone (preferred) [phone]- desk phone

## 2021-12-15 ENCOUNTER — Encounter (HOSPITAL_BASED_OUTPATIENT_CLINIC_OR_DEPARTMENT_OTHER): Payer: Medicare Other | Admitting: General Surgery

## 2021-12-15 DIAGNOSIS — I87332 Chronic venous hypertension (idiopathic) with ulcer and inflammation of left lower extremity: Secondary | ICD-10-CM | POA: Diagnosis not present

## 2021-12-15 LAB — CBC
Hematocrit: 38.5 % (ref 34.0–46.6)
Hemoglobin: 13.1 g/dL (ref 11.1–15.9)
MCH: 32.3 pg (ref 26.6–33.0)
MCHC: 34 g/dL (ref 31.5–35.7)
MCV: 95 fL (ref 79–97)
Platelets: 216 10*3/uL (ref 150–450)
RBC: 4.05 x10E6/uL (ref 3.77–5.28)
RDW: 15.2 % (ref 11.7–15.4)
WBC: 7.1 10*3/uL (ref 3.4–10.8)

## 2021-12-15 LAB — BASIC METABOLIC PANEL
BUN/Creatinine Ratio: 14 (ref 12–28)
BUN: 15 mg/dL (ref 8–27)
CO2: 22 mmol/L (ref 20–29)
Calcium: 9.6 mg/dL (ref 8.7–10.3)
Chloride: 101 mmol/L (ref 96–106)
Creatinine, Ser: 1.07 mg/dL — ABNORMAL HIGH (ref 0.57–1.00)
Glucose: 125 mg/dL — ABNORMAL HIGH (ref 70–99)
Potassium: 4.1 mmol/L (ref 3.5–5.2)
Sodium: 139 mmol/L (ref 134–144)
eGFR: 57 mL/min/{1.73_m2} — ABNORMAL LOW (ref >59)

## 2021-12-16 ENCOUNTER — Telehealth: Payer: Self-pay | Admitting: Licensed Clinical Social Worker

## 2021-12-16 ENCOUNTER — Telehealth: Payer: Self-pay | Admitting: Cardiology

## 2021-12-16 DIAGNOSIS — I480 Paroxysmal atrial fibrillation: Secondary | ICD-10-CM

## 2021-12-16 NOTE — Telephone Encounter (Signed)
H&V Care Navigation CSW Progress Note  Clinical Social Worker contacted patient by phone to ensure she still has transportation to appt tomorrow for cardioversion. Able to reach her at [phone]. Pt confirms she is all set for tomorrow, no additional questions/concerns for this writer at this time.   Patient is participating in a Managed Medicaid Plan:  No, BCBS Medicare only.   SDOH Screenings   Food Insecurity: No Food Insecurity (12/03/2021)  Housing: Low Risk  (12/03/2021)  Transportation Needs: Unmet Transportation Needs (12/03/2021)  Utilities: Not At Risk (12/03/2021)  Depression (PHQ2-9): Low Risk  (10/26/2021)  Financial Resource Strain: Low Risk  (12/03/2021)  Tobacco Use: Medium Risk (12/02/2021)    Octavio Graves, MSW, LCSW Clinical Social Worker II [facility] Heart/Vascular Care Navigation  [phone]- work cell phone (preferred) [phone]- desk phone

## 2021-12-16 NOTE — Telephone Encounter (Signed)
Spoke with patient who asked if she can be admitted overnight tomorrow after cardioversion. Dr. Servando Salina advised. Patient informed that Dr. Servando Salina stated cardioversion does not required overnight stay. Patient said she was told by Delaney Meigs that she needed to have someone with her overnight. Patient wanted to reschedule cardioversion. Recommended that she speak with Delaney Meigs then contact our clinic.

## 2021-12-16 NOTE — Telephone Encounter (Signed)
Patient called clinic back and stated she cancelled her cardioversion tomorrow because she has nobody to stay with her at home. I asked if her frined (listed as a contact) could stay with her. She said, "No, she has her own family." I then suggested she could ask somebody form her church, etc. Or hire a sister. She said she cannot do that. Recommended she make arrangements for the next time she is scheduled for cardioversion. She still said she is not able to do that. She stated endo needs a new order to reschedule.

## 2021-12-16 NOTE — Telephone Encounter (Signed)
New Message:    Patient is scheduled for a procedure at the hospital tomorrow(12-17-21). She wants Dr Servando Salina to know if she have to have someone to spend the night at home with her after the procedure, she does not have anyone that can stay with her. If this is true, she wants to know if Dr Servando Salina can admit her for 1 night to the hospital, so she will not be alone. All this is only if it necessary that she needs someone to stay with her after her procedure.t

## 2021-12-17 ENCOUNTER — Encounter (HOSPITAL_COMMUNITY): Admission: RE | Payer: Self-pay | Source: Home / Self Care

## 2021-12-17 ENCOUNTER — Ambulatory Visit (HOSPITAL_COMMUNITY): Admission: RE | Admit: 2021-12-17 | Payer: Medicare Other | Source: Home / Self Care | Admitting: Cardiovascular Disease

## 2021-12-17 SURGERY — CARDIOVERSION
Anesthesia: General

## 2021-12-17 NOTE — Progress Notes (Signed)
ZENIAH, BRINEY ([id number]) [id number] Page 1 of 8 Visit Report for 12/15/2021 Arrival Information Details Patient Name: Date of Service: Christine Jacobson, Christine Jacobson 12/15/2021 8:45 A M Medical Record Number: [id number] Patient Account Number: [id number] Date of Birth/Sex: Treating RN: 17-Apr-1955 (66 y.o. F) Zochol, Christine Jacobson Primary Care Christine Jacobson: Christine Jacobson Other Clinician: Referring Jordani Nunn: Treating Christine Jacobson/Extender: Daralene Milch in Treatment: 5 Visit Information History Since Last Visit Added or deleted any medications: No Patient Arrived: Ambulatory Any new allergies or adverse reactions: No Arrival Time: 09:06 Had a fall or experienced change in No Accompanied By: self activities of daily living that may affect Transfer Assistance: None risk of falls: Patient Requires Transmission-Based Precautions: No Signs or symptoms of abuse/neglect since last visito No Patient Has Alerts: No Hospitalized since last visit: No Implantable device outside of the clinic excluding No cellular tissue based products placed in the center since last visit: Has Compression in Place as Prescribed: No Pain Present Now: No Electronic Signature(s) Signed: 12/16/2021 7:35:29 PM By: Tommie Ard RN Entered By: Tommie Ard on 12/15/2021 09:07:03 -------------------------------------------------------------------------------- Compression Therapy Details Patient Name: Date of Service: Christine Jacobson, Christine Jacobson 12/15/2021 8:45 A M Medical Record Number: [id number] Patient Account Number: [id number] Date of Birth/Sex: Treating RN: November 08, 1955 (66 y.o. Kateri Mc Primary Care Blonnie Maske: Christine Jacobson Other Clinician: Referring Timmi Devora: Treating Christine Jacobson/Extender: Daralene Milch in Treatment: 5 Compression Therapy Performed for Wound Assessment: Wound #1 Left,Posterior Lower Leg Performed By: Clinician Tommie Ard, RN Compression Type:  Henriette Combs Post Procedure Diagnosis Same as Pre-procedure Electronic Signature(s) Signed: 12/16/2021 7:35:29 PM By: Tommie Ard RN Entered By: Tommie Ard on 12/15/2021 09:31:22 Christine Jacobson ([id number]) [id number] Page 2 of 8 -------------------------------------------------------------------------------- Encounter Discharge Information Details Patient Name: Date of Service: Christine Jacobson, Christine Jacobson 12/15/2021 8:45 A M Medical Record Number: [id number] Patient Account Number: [id number] Date of Birth/Sex: Treating RN: 15-Apr-1955 (66 y.o. [name], Christine Jacobson Primary Care Solomia Harrell: Christine Jacobson Other Clinician: Referring Christine Jacobson: Treating Keola Heninger/Extender: Daralene Milch in Treatment: 5 Encounter Discharge Information Items Post Procedure Vitals Discharge Condition: Stable Temperature (F): 97.7 Ambulatory Status: Ambulatory Pulse (bpm): 73 Discharge Destination: Home Respiratory Rate (breaths/min): 18 Transportation: Private Auto Blood Pressure (mmHg): 132/81 Accompanied By: self Schedule Follow-up Appointment: Yes Clinical Summary of Care: Electronic Signature(s) Signed: 12/16/2021 7:35:29 PM By: Tommie Ard RN Entered By: Tommie Ard on 12/15/2021 11:44:13 -------------------------------------------------------------------------------- Lower Extremity Assessment Details Patient Name: Date of Service: Christine Jacobson, Christine Jacobson 12/15/2021 8:45 A M Medical Record Number: [id number] Patient Account Number: [id number] Date of Birth/Sex: Treating RN: Dec 06, 1955 (66 y.o. [name], Christine Jacobson Primary Care Amberley Hamler: Christine Jacobson Other Clinician: Referring Christine Jacobson: Treating Christine Jacobson/Extender: Daralene Milch in Treatment: 5 Edema Assessment Assessed: [Left: No] [Right: No] [Left: Edema] [Right: :] Calf Left: Right: Point of Measurement: 32 cm From Medial Instep 47 cm 42 cm Ankle Left: Right: Point of Measurement: 10  cm From Medial Instep 23 cm 32 cm Vascular Assessment Pulses: Dorsalis Pedis Palpable: [Left:Yes] Electronic Signature(s) Signed: 12/16/2021 7:35:29 PM By: Tommie Ard RN Entered By: Tommie Ard on 12/15/2021 09:11:28 Multi Wound Chart Details -------------------------------------------------------------------------------- Christine Jacobson ([id number]) [id number] Page 3 of 8 Patient Name: Date of Service: Christine Jacobson, Christine Jacobson 12/15/2021 8:45 A M Medical Record Number: [id number] Patient Account Number: [id number] Date of Birth/Sex: Treating RN: [birth year] (66 y.o. F) Primary Care Blaze Sandin: Christine Jacobson Other Clinician: Referring Christine Jacobson: Treating Christine Jacobson/Extender: Daralene Milch in Treatment: 5 Vital Signs Height(in): 64 Pulse(bpm): 73  Weight(lbs): 203 Blood Pressure(mmHg): 132/81 Body Mass Index(BMI): 34.8 Temperature(F): 97.7 Respiratory Rate(breaths/min): 18 Wound Assessments Wound Number: 1 N/A N/A Photos: N/A N/A Left, Posterior Lower Leg N/A N/A Wound Location: Gradually Appeared N/A N/A Wounding Event: Venous Leg Ulcer N/A N/A Primary Etiology: Lymphedema, Congestive Heart N/A N/A Comorbid History: Failure, Hypertension 08/17/2021 N/A N/A Date Acquired: 5 N/A N/A Weeks of Treatment: Open N/A N/A Wound Status: No N/A N/A Wound Recurrence: 6x2x0.1 N/A N/A Measurements L x W x D (cm) 9.425 N/A N/A A (cm) : rea 0.942 N/A N/A Volume (cm) : 81.00% N/A N/A % Reduction in A rea: 90.50% N/A N/A % Reduction in Volume: Full Thickness Without Exposed N/A N/A Classification: Support Structures Medium N/A N/A Exudate A mount: Serosanguineous N/A N/A Exudate Type: red, brown N/A N/A Exudate Color: Distinct, outline attached N/A N/A Wound Margin: Large (67-100%) N/A N/A Granulation A mount: Pink N/A N/A Granulation Quality: Small (1-33%) N/A N/A Necrotic A mount: Fat Layer (Subcutaneous Tissue): Yes  N/A N/A Exposed Structures: Fascia: No Tendon: No Muscle: No Joint: No Bone: No Small (1-33%) N/A N/A Epithelialization: Debridement - Selective/Open Wound N/A N/A Debridement: Pre-procedure Verification/Time Out 09:19 N/A N/A Taken: Slough N/A N/A Tissue Debrided: Non-Viable Tissue N/A N/A Level: 12 N/A N/A Debridement A (sq cm): rea Curette N/A N/A Instrument: Minimum N/A N/A Bleeding: Pressure N/A N/A Hemostasis A chieved: 0 N/A N/A Post Procedural Pain: Procedure was tolerated well N/A N/A Debridement Treatment Response: 6x2x0.1 N/A N/A Post Debridement Measurements L x W x D (cm) 0.942 N/A N/A Post Debridement Volume: (cm) Dry/Scaly: Yes N/A N/A Periwound Skin Moisture: Maceration: No Erythema: No N/A N/A Periwound Skin Color: No Abnormality N/A N/A Temperature: Yes N/A N/A Tenderness on Palpation: Compression Therapy N/A N/A Procedures Performed: Debridement Treatment Notes Pagliuca, Aymar ([id number]) [id number] Page 4 of 8 Electronic Signature(s) Signed: 12/15/2021 9:41:41 AM By: Fredirick Maudlin MD FACS Entered By: Fredirick Maudlin on 12/15/2021 09:41:41 -------------------------------------------------------------------------------- Multi-Disciplinary Care Plan Details Patient Name: Date of Service: Christine Jacobson, Christine Jacobson 12/15/2021 8:45 A M Medical Record Number: [id number] Patient Account Number: [id number] Date of Birth/Sex: Treating RN: [birth year] (66 y.o. Marta Lamas Primary Care Freda Jaquith: Earlean Polka Other Clinician: Referring Evangeline Utley: Treating Marques Ericson/Extender: Elizebeth Koller in Treatment: 5 Active Inactive Venous Leg Ulcer Nursing Diagnoses: Knowledge deficit related to disease process and management Goals: Patient will maintain optimal edema control Date Initiated: 11/04/2021 Target Resolution Date: 12/28/2021 Goal Status: Active Patient/caregiver will verbalize understanding  of disease process and disease management Date Initiated: 11/04/2021 Target Resolution Date: 12/16/2021 Goal Status: Active Verify adequate tissue perfusion prior to therapeutic compression application Date Initiated: 11/04/2021 Target Resolution Date: 12/23/2021 Goal Status: Active Interventions: Assess peripheral edema status every visit. Compression as ordered Provide education on venous insufficiency Notes: Wound/Skin Impairment Nursing Diagnoses: Impaired tissue integrity Knowledge deficit related to ulceration/compromised skin integrity Goals: Patient/caregiver will verbalize understanding of skin care regimen Date Initiated: 11/04/2021 Target Resolution Date: 12/09/2021 Goal Status: Active Ulcer/skin breakdown will have a volume reduction of 30% by week 4 Date Initiated: 11/04/2021 Date Inactivated: 12/15/2021 Target Resolution Date: 12/02/2021 Unmet Reason: edema uncontrolled-pt Goal Status: Unmet has chf Interventions: Assess patient/caregiver ability to obtain necessary supplies Assess patient/caregiver ability to perform ulcer/skin care regimen upon admission and as needed Assess ulceration(s) every visit Provide education on ulcer and skin care Notes: edema uncontrolled- changed dressing to [employer]) Signed: 12/16/2021 7:35:29 PM By: Blanche East RN Entered By: Blanche East on 12/15/2021 09:32:37 Christine Jacobson, Christine Jacobson ([id number]) [id number]  Page 5 of 8 -------------------------------------------------------------------------------- Pain Assessment Details Patient Name: Date of Service: Christine Jacobson, Christine Jacobson 12/15/2021 8:45 A M Medical Record Number: [id number] Patient Account Number: [id number] Date of Birth/Sex: Treating RN: 10/01/1955 (66 y.o. Marta Lamas Primary Care Alisandra Son: Earlean Polka Other Clinician: Referring Syanna Remmert: Treating Asante Blanda/Extender: Elizebeth Koller in Treatment:  5 Active Problems Location of Pain Severity and Description of Pain Patient Has Paino No Site Locations Rate the pain. Current Pain Level: 0 Pain Management and Medication Current Pain Management: Electronic Signature(s) Signed: 12/16/2021 7:35:29 PM By: Blanche East RN Entered By: Blanche East on 12/15/2021 09:11:14 -------------------------------------------------------------------------------- Patient/Caregiver Education Details Patient Name: Date of Service: Newell Coral Jacobson 11/29/2023andnbsp8:45 A M Medical Record Number: [id number] Patient Account Number: [id number] Date of Birth/Gender: Treating RN: 09-Jan-1956 (66 y.o. Marta Lamas Primary Care Physician: Earlean Polka Other Clinician: Referring Physician: Treating Physician/Extender: Elizebeth Koller in Treatment: 5 Education Assessment Education Provided To: Patient Education Topics Provided Venous: Methods: Explain/Verbal Christine Jacobson, Christine Jacobson ([id number]) [id number] Page 6 of 8 Responses: Reinforcements needed, State content correctly Wound/Skin Impairment: Methods: Explain/Verbal Responses: Reinforcements needed, State content correctly Electronic Signature(s) Signed: 12/16/2021 7:35:29 PM By: Blanche East RN Entered By: Blanche East on 12/15/2021 09:32:59 -------------------------------------------------------------------------------- Wound Assessment Details Patient Name: Date of Service: Christine Jacobson, Christine Jacobson 12/15/2021 8:45 A M Medical Record Number: [id number] Patient Account Number: [id number] Date of Birth/Sex: Treating RN: December 25, 1955 (65 y.o. F) Zochol, Christine Jacobson Primary Care Taher Vannote: Earlean Polka Other Clinician: Referring Neyra Pettie: Treating Math Brazie/Extender: Elizebeth Koller in Treatment: 5 Wound Status Wound Number: 1 Primary Etiology: Venous Leg Ulcer Wound Location: Left, Posterior Lower Leg Wound Status: Open Wounding Event:  Gradually Appeared Notes: wound has combined with lateral wound Date Acquired: 08/17/2021 Comorbid History: Lymphedema, Congestive Heart Failure, Hypertension Weeks Of Treatment: 5 Clustered Wound: No Photos Wound Measurements Length: (cm) 6 Width: (cm) 2 Depth: (cm) 0.1 Area: (cm) 9.425 Volume: (cm) 0.942 % Reduction in Area: 81% % Reduction in Volume: 90.5% Epithelialization: Small (1-33%) Tunneling: No Undermining: No Wound Description Classification: Full Thickness Without Exposed Suppor Wound Margin: Distinct, outline attached Exudate Amount: Medium Exudate Type: Serosanguineous Exudate Color: red, brown t Structures Foul Odor After Cleansing: No Slough/Fibrino Yes Wound Bed Granulation Amount: Large (67-100%) Exposed Structure Granulation Quality: Pink Fascia Exposed: No Necrotic Amount: Small (1-33%) Fat Layer (Subcutaneous Tissue) Exposed: Yes Necrotic Quality: Adherent Slough Tendon Exposed: No Muscle Exposed: No Joint Exposed: No Bone Exposed: No Christine Jacobson, Christine Jacobson ([id number]) [id number] Page 7 of 8 Periwound Skin Texture Texture Color No Abnormalities Noted: No No Abnormalities Noted: No Erythema: No Moisture No Abnormalities Noted: No Temperature / Pain Dry / Scaly: Yes Temperature: No Abnormality Maceration: No Tenderness on Palpation: Yes Treatment Notes Wound #1 (Lower Leg) Wound Laterality: Left, Posterior Cleanser Soap and Water Discharge Instruction: May shower and wash wound with dial antibacterial soap and water prior to dressing change. Peri-Wound Care Triamcinolone 15 (g) Discharge Instruction: Use triamcinolone 15 (g) as directed Sween Lotion (Moisturizing lotion) Discharge Instruction: Apply moisturizing lotion as directed Topical Primary Dressing Secondary Dressing ABD Pad, 8x10 Discharge Instruction: Apply over primary dressing as directed. Woven Gauze Sponge, Non-Sterile 4x4 in Discharge Instruction:  Apply over primary dressing as directed. Secured With [employer] 4x5 (in/yd) Discharge Instruction: Secure with Coban as directed. Kerlix Roll Sterile, 4.5x3.1 (in/yd) Discharge Instruction: Secure with Kerlix as directed. Compression Wrap Compression Stockings Add-Ons Electronic Signature(s) Signed: 12/15/2021 4:50:52 PM By: Deon Pilling RN, BSN Signed: 12/16/2021 7:35:29 PM  By: Blanche East RN Entered By: Deon Pilling on 12/15/2021 09:14:20 -------------------------------------------------------------------------------- Vitals Details Patient Name: Date of Service: Christine Jacobson, TORNETTA Jacobson 12/15/2021 8:45 A M Medical Record Number: [id number] Patient Account Number: [id number] Date of Birth/Sex: Treating RN: August 13, 1955 (66 y.o. F) Zochol, Christine Jacobson Primary Care Kaushik Maul: Earlean Polka Other Clinician: Referring Undrea Shipes: Treating Ireene Ballowe/Extender: Elizebeth Koller in Treatment: 5 Vital Signs Time Taken: 09:09 Temperature (F): 97.7 Height (in): 64 Pulse (bpm): 73 Weight (lbs): 203 Respiratory Rate (breaths/min): 18 Body Mass Index (BMI): 34.8 Blood Pressure (mmHg): 132/81 Reference Range: 80 - 120 mg / dl Makara, Cherilyn ([id number]) [id number] Page 8 of 8 Electronic Signature(s) Signed: 12/16/2021 7:35:29 PM By: Blanche East RN Entered By: Blanche East on 12/15/2021 09:10:15

## 2021-12-17 NOTE — Telephone Encounter (Signed)
Called scheduling and scheduled 1st available 12-30-21 @ 11 am  w/Dr Bjorn Pippin. LAB ENTERED  WENT OVER [facility] INSTRUCTIONS BELOW, VERBALIZED UNDERSTANDING. SHE STATES THAT SHE IS ONLY GOING TO TAKE HER ELIQUIS. LETTER MAILED TO PT.      Dear Christine Jacobson  You are scheduled for a Cardioversion on Thursday, December 14 with Dr. Bjorn Pippin.  Please arrive at the [facility] (Main Entrance A) at [facility]: [address] [place], [place] [address] at 10:00 AM.    DIET:  Nothing to eat or drink after midnight except a sip of water with medications (see medication instructions below)  MEDICATION INSTRUCTIONS:  Continue taking your anticoagulant (blood thinner): Apixaban (Eliquis). ALL OTHER MEDICATIONS. til you are told by your provider that it is safe to stop.    LABS:   Come to [facility]  MONDAY 12-27-21,FOR NON-FASTING.  FYI:  For your safety, and to allow [place] to monitor your vital signs accurately during the surgery/procedure we request: If you have artificial nails, gel coating, SNS etc, please have those removed prior to your surgery/procedure. Not having the nail coverings /polish removed may result in cancellation or delay of your surgery/procedure.  You must have a responsible person to drive you home and stay in the waiting area during your procedure. Failure to do so could result in cancellation.  Bring your insurance cards.  *Special Note: Every effort is made to have your procedure done on time. Occasionally there are emergencies that occur at the hospital that may cause delays. Please be patient if a delay does occur.

## 2021-12-17 NOTE — Addendum Note (Signed)
Addended by: Alyson Ingles on: 12/17/2021 01:17 PM   Modules accepted: Orders

## 2021-12-17 NOTE — Telephone Encounter (Signed)
  Pt is calling back to f/u. She would like to speak with a nurse again

## 2021-12-17 NOTE — Progress Notes (Signed)
Christine Jacobson, GERSTENBERGER ([id number]) [id number] Page 1 of 8 Visit Report for 12/15/2021 Chief Complaint Document Details Patient Name: Date of Service: MYCA, PERNO 12/15/2021 8:45 A M Medical Record Number: [id number] Patient Account Number: [id number] Date of Birth/Sex: Treating RN: [birth year] (66 y.o. F) Primary Care Provider: Erma Heritage Other Clinician: Referring Provider: Treating Provider/Extender: Daralene Milch in Treatment: 5 Information Obtained from: Patient Chief Complaint 11/04/2021; patient is here for review of wounds on the left lower leg referred by Dr. Luciana Axe Electronic Signature(s) Signed: 12/15/2021 9:41:48 AM By: Duanne Guess MD FACS Entered By: Duanne Guess on 12/15/2021 09:41:48 -------------------------------------------------------------------------------- Debridement Details Patient Name: Date of Service: LATITIA, HOUSEWRIGHT Jacobson 12/15/2021 8:45 A M Medical Record Number: [id number] Patient Account Number: [id number] Date of Birth/Sex: Treating RN: September 17, 1955 (66 y.o. [name], [name] Primary Care Provider: Erma Heritage Other Clinician: Referring Provider: Treating Provider/Extender: Daralene Milch in Treatment: 5 Debridement Performed for Assessment: Wound #1 Left,Posterior Lower Leg Performed By: Physician Duanne Guess, MD Debridement Type: Debridement Severity of Tissue Pre Debridement: Fat layer exposed Level of Consciousness (Pre-procedure): Awake and Alert Pre-procedure Verification/Time Out Yes - 09:19 Taken: Start Time: 09:20 T Area Debrided (L x W): otal 6 (cm) x 2 (cm) = 12 (cm) Tissue and other material debrided: Slough, Slough Level: Non-Viable Tissue Debridement Description: Selective/Open Wound Instrument: Curette Bleeding: Minimum Hemostasis Achieved: Pressure Post Procedural Pain: 0 Response to Treatment: Procedure was tolerated well Level of Consciousness  (Post- Awake and Alert procedure): Post Debridement Measurements of Total Wound Length: (cm) 6 Width: (cm) 2 Depth: (cm) 0.1 Volume: (cm) 0.942 Character of Wound/Ulcer Post Debridement: Requires Further Debridement Severity of Tissue Post Debridement: Fat layer exposed Post Procedure Diagnosis Same as Pre-procedure Jacobson, Christine ([id number]) [id number] Page 2 of 8 Notes Scribed for Dr. Lady Gary by Tommie Ard, RN Electronic Signature(s) Signed: 12/15/2021 10:52:19 AM By: Duanne Guess MD FACS Signed: 12/16/2021 7:35:29 PM By: Tommie Ard RN Entered By: Tommie Ard on 12/15/2021 09:22:10 -------------------------------------------------------------------------------- HPI Details Patient Name: Date of Service: Christine Jacobson, KURTENBACH Jacobson 12/15/2021 8:45 A M Medical Record Number: [id number] Patient Account Number: [id number] Date of Birth/Sex: Treating RN: December 04, 1955 (66 y.o. F) Primary Care Provider: Erma Heritage Other Clinician: Referring Provider: Treating Provider/Extender: Daralene Milch in Treatment: 5 History of Present Illness HPI Description: ADMISSION 11/04/2021 This is a 66 year old woman who comes into the clinic for review of wounds on the left leg. This includes several small areas on the left lateral lower leg as well as a substantial area on the left posterior calf. The patient was referred by Dr. Luciana Axe of infectious disease. She says that this began in the end of September when she developed lower extremity swelling and pain. She was seen in urgent care and put on Dalbvancin. However she required admission from 9/21 through 9/27 and was treated with ceftriaxone outpatient cefadroxil. A CT scan of the leg was negative for abscess. She was referred as an outpatient to Dr. Verdie Drown who saw her on October 10 did not feel she had any additional infectious issues and referred her here. 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 She has been using a cream she had on the wounds on her left leg left over from that timeframe. Past medical history includes recent diagnosis  of chronic diastolic heart failure, paroxysmal A-fib on Eliquis, HOCM/ASH, hypertension and a history of pyoderma that is a self-report. ABI in this clinic was 0.63. The patient is a non-smoker she is not a known diabetic 11/11/2021: The patient did not go for her vascular studies. She says she did not understand what she was being called about. I spent some time explaining to her the value of those tests for her wound healing. Today, the 2 wounds have merged into 1. There is some slough on the wound surface and it remains extremely tender. No malodor or purulent drainage. 12/01/2021: The culture that I took at her last visit returned with a polymicrobial population including Proteus mirabilis, Serratia marcescens, and Pseudomonas aeruginosa. I sent in a prescription for levofloxacin and she completed a 10-day course. Unfortunately, she has been having some cardiac issues and did not return to clinic. She was most recently seen at the [facility] ED and they removed her wrap. Her leg is tremendously swollen today. She has been scratching at it and there are new excoriation wounds on her anterior tibial surface. The posterior leg wound actually looks better. There is some slough on the surface and it remains fairly tender. Unfortunately, she is unable to afford the [facility] antibiotic compound. 12/15/2021: Her legs are extremely swollen today. She saw cardiology and they are planning a cardioversion on Friday and this has caused her to be extremely worried and she is crying in clinic. On the other hand, her wound is smaller and more superficial. There is slough accumulation on the  surface. Electronic Signature(s) Signed: 12/15/2021 9:43:24 AM By: Fredirick Maudlin MD FACS Entered By: Fredirick Maudlin on 12/15/2021 09:43:24 -------------------------------------------------------------------------------- Physical Exam Details Patient Name: Date of Service: Christine Jacobson, AUDET Jacobson 12/15/2021 8:45 A M Medical Record Number: [id number] Patient Account Number: [id number] Date of Birth/Sex: Treating RN: 10-14-1955 (66 y.o. F) Primary Care Provider: Earlean Polka Other Clinician: Referring Provider: Treating Provider/Extender: Elizebeth Koller in Treatment: [place], Tiarah ([id number]) [id number] Page 3 of 8 Constitutional . . . . No acute distress. Respiratory Normal work of breathing on room air. Cardiovascular 3+ pitting edema bilaterally. Notes 12/15/2021: Her wound is smaller and more superficial. There is slough accumulation on the surface. Her edema control is abysmal. Electronic Signature(s) Signed: 12/15/2021 9:53:38 AM By: Fredirick Maudlin MD FACS Previous Signature: 12/15/2021 9:43:47 AM Version By: Fredirick Maudlin MD FACS Entered By: Fredirick Maudlin on 12/15/2021 09:53:38 -------------------------------------------------------------------------------- Physician Orders Details Patient Name: Date of Service: REA, ARIZOLA Jacobson 12/15/2021 8:45 A M Medical Record Number: [id number] Patient Account Number: [id number] Date of Birth/Sex: Treating RN: 19-Feb-1955 (66 y.o. F) Zochol, [name] Primary Care Provider: Earlean Polka Other Clinician: Referring Provider: Treating Provider/Extender: Elizebeth Koller in Treatment: 5 Verbal / Phone Orders: No Diagnosis Coding ICD-10 Coding Code Description [phone] Non-pressure chronic ulcer of left calf with other specified severity I87.332 Chronic venous hypertension (idiopathic) with ulcer and inflammation of left lower extremity L03.116 Cellulitis of left  lower limb Follow-up Appointments ppointment in 2 weeks. - Dr. Celine Ahr rm 3 Return A Anesthetic Wound #1 Left,Posterior Lower Leg (In clinic) Topical Lidocaine 5% applied to wound bed Bathing/ Shower/ Hygiene May shower with protection but do not get wound dressing(s) wet. - purchase cast protector from [facility], [facility] or [employer] Edema Control - Lymphedema / SCD / Other Bilateral Lower Extremities Elevate legs to the level of the heart or above for 30 minutes daily and/or when sitting, a frequency of: Avoid standing  for long periods of time. Exercise regularly - walking daily Moisturize legs daily. - right leg Wound Treatment Wound #1 - Lower Leg Wound Laterality: Left, Posterior Cleanser: Soap and Water 1 x Per Week/30 Days Discharge Instructions: May shower and wash wound with dial antibacterial soap and water prior to dressing change. Peri-Wound Care: Triamcinolone 15 (g) 1 x Per Week/30 Days Discharge Instructions: Use triamcinolone 15 (g) as directed Peri-Wound Care: Sween Lotion (Moisturizing lotion) 1 x Per Week/30 Days Discharge Instructions: Apply moisturizing lotion as directed Secondary Dressing: ABD Pad, 8x10 1 x Per Week/30 Days Discharge Instructions: Apply over primary dressing as directed. MONTIE, ABBASI ([id number]) [id number] Page 4 of 8 Secondary Dressing: Woven Gauze Sponge, Non-Sterile 4x4 in 1 x Per Week/30 Days Discharge Instructions: Apply over primary dressing as directed. Secured With: Coban Self-Adherent Wrap 4x5 (in/yd) 1 x Per Week/30 Days Discharge Instructions: Secure with Coban as directed. Secured With: [employer], 4.5x3.1 (in/yd) 1 x Per Week/30 Days Discharge Instructions: Secure with Kerlix as directed. Electronic Signature(s) Signed: 12/15/2021 10:52:19 AM By: Fredirick Maudlin MD FACS Entered By: Fredirick Maudlin on 12/15/2021  09:54:16 -------------------------------------------------------------------------------- Problem List Details Patient Name: Date of Service: Christine Jacobson, TO Jacobson 12/15/2021 8:45 A M Medical Record Number: [id number] Patient Account Number: [id number] Date of Birth/Sex: Treating RN: Sep 24, 1955 (66 y.o. F) Primary Care Provider: Earlean Polka Other Clinician: Referring Provider: Treating Provider/Extender: Elizebeth Koller in Treatment: 5 Active Problems ICD-10 Encounter Code Description Active Date MDM Diagnosis L97.228 Non-pressure chronic ulcer of left calf with other specified severity 11/04/2021 No Yes I87.332 Chronic venous hypertension (idiopathic) with ulcer and inflammation of left 11/04/2021 No Yes lower extremity L03.116 Cellulitis of left lower limb 11/04/2021 No Yes Inactive Problems Resolved Problems Electronic Signature(s) Signed: 12/15/2021 9:41:35 AM By: Fredirick Maudlin MD FACS Entered By: Fredirick Maudlin on 12/15/2021 09:41:35 -------------------------------------------------------------------------------- Progress Note Details Patient Name: Date of Service: ICA, HOLFORD Jacobson 12/15/2021 8:45 A M Medical Record Number: [id number] Patient Account Number: [id number] Date of Birth/Sex: Treating RN: 1955/06/17 (66 y.o. F) Primary Care Provider: Earlean Polka Other ClinicianLUCA, SANANGELO ([id number]) [id number] Page 5 of 8 Referring Provider: Treating Provider/Extender: Elizebeth Koller in Treatment: 5 Subjective Chief Complaint Information obtained from Patient 11/04/2021; patient is here for review of wounds on the left lower leg referred by Dr. Linus Salmons History of Present Illness (HPI) ADMISSION 11/04/2021 This is a 66 year old woman who comes into the clinic for review of wounds on the left leg. This includes several small areas on the left lateral lower leg as well as a substantial area on the  left posterior calf. The patient was referred by Dr. Linus Salmons of infectious disease. She says that this began in the end of September when she developed lower extremity swelling and pain. She was seen in urgent care and put on Dalbvancin. However she required admission from 9/21 through 9/27 and was treated with ceftriaxone outpatient cefadroxil. A CT scan of the leg was negative for abscess. She was referred as an outpatient to Dr. Arelia Longest who saw her on October 10 did not feel she had any additional infectious issues and referred her here. 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 She has been using a cream she had on the wounds on her left leg left over from that  timeframe. Past medical history includes recent diagnosis of chronic diastolic heart failure, paroxysmal A-fib on Eliquis, HOCM/ASH, hypertension and a history of pyoderma that is a self-report. ABI in this clinic was 0.63. The patient is a non-smoker she is not a known diabetic 11/11/2021: The patient did not go for her vascular studies. She says she did not understand what she was being called about. I spent some time explaining to her the value of those tests for her wound healing. Today, the 2 wounds have merged into 1. There is some slough on the wound surface and it remains extremely tender. No malodor or purulent drainage. 12/01/2021: The culture that I took at her last visit returned with a polymicrobial population including Proteus mirabilis, Serratia marcescens, and Pseudomonas aeruginosa. I sent in a prescription for levofloxacin and she completed a 10-day course. Unfortunately, she has been having some cardiac issues and did not return to clinic. She was most recently seen at the [facility] ED and they removed her wrap. Her leg is tremendously swollen  today. She has been scratching at it and there are new excoriation wounds on her anterior tibial surface. The posterior leg wound actually looks better. There is some slough on the surface and it remains fairly tender. Unfortunately, she is unable to afford the [facility] antibiotic compound. 12/15/2021: Her legs are extremely swollen today. She saw cardiology and they are planning a cardioversion on Friday and this has caused her to be extremely worried and she is crying in clinic. On the other hand, her wound is smaller and more superficial. There is slough accumulation on the surface. Patient History Information obtained from Patient. Family History Cancer - Mother, Diabetes - Maternal Grandparents, Hypertension - Father, Lung Disease - Father, Stroke - Maternal Grandparents. Social History Former smoker - quit 20+ years ago, Marital Status - Single, Alcohol Use - Never, Drug Use - No History, Caffeine Use - Never. Medical History Hematologic/Lymphatic Patient has history of Lymphedema Cardiovascular Patient has history of Congestive Heart Failure, Hypertension Hospitalization/Surgery History - heart cath 10/15/21. Medical A Surgical History Notes nd Cardiovascular cardiomyopathy hyperlipidemia Psychiatric depression/anxiety Objective Constitutional No acute distress. Vitals Time Taken: 9:09 AM, Height: 64 in, Weight: 203 lbs, BMI: 34.8, Temperature: 97.7 F, Pulse: 73 bpm, Respiratory Rate: 18 breaths/min, Blood Pressure: 132/81 mmHg. Respiratory Normal work of breathing on room air. Cardiovascular 3+ pitting edema bilaterally. YEKATERINA, LARAMIE ([id number]) [id number] Page 6 of 8 General Notes: 12/15/2021: Her wound is smaller and more superficial. There is slough accumulation on the surface. Her edema control is abysmal. Integumentary (Hair, Skin) Wound #1 status is Open. Original cause of wound was Gradually Appeared. The date acquired was: 08/17/2021.  The wound has been in treatment 5 weeks. The wound is located on the Left,Posterior Lower Leg. The wound measures 6cm length x 2cm width x 0.1cm depth; 9.425cm^2 area and 0.942cm^3 volume. There is Fat Layer (Subcutaneous Tissue) exposed. There is no tunneling or undermining noted. There is a medium amount of serosanguineous drainage noted. The wound margin is distinct with the outline attached to the wound base. There is large (67-100%) pink granulation within the wound bed. There is a small (1-33%) amount of necrotic tissue within the wound bed including Adherent Slough. The periwound skin appearance exhibited: Dry/Scaly. The periwound skin appearance did not exhibit: Maceration, Erythema. Periwound temperature was noted as No Abnormality. The periwound has tenderness on palpation. Assessment Active Problems ICD-10 Non-pressure chronic ulcer of left calf with other specified severity Chronic venous hypertension (idiopathic) with  ulcer and inflammation of left lower extremity Cellulitis of left lower limb Procedures Wound #1 Pre-procedure diagnosis of Wound #1 is a Venous Leg Ulcer located on the Left,Posterior Lower Leg .Severity of Tissue Pre Debridement is: Fat layer exposed. There was a Selective/Open Wound Non-Viable Tissue Debridement with a total area of 12 sq cm performed by Fredirick Maudlin, MD. With the following instrument(s): Curette Material removed includes [facility]. No specimens were taken. A time out was conducted at 09:19, prior to the start of the procedure. A Minimum amount of bleeding was controlled with Pressure. The procedure was tolerated well. The patient had a pain level of 0 following the procedure. Post Debridement Measurements: 6cm length x 2cm width x 0.1cm depth; 0.942cm^3 volume. Character of Wound/Ulcer Post Debridement requires further debridement. Severity of Tissue Post Debridement is: Fat layer exposed. Post procedure Diagnosis Wound #1: Same as  Pre-Procedure General Notes: Scribed for Dr. Celine Ahr by Blanche East, RN. Pre-procedure diagnosis of Wound #1 is a Venous Leg Ulcer located on the Left,Posterior Lower Leg . There was a Haematologist Compression Therapy Procedure by Blanche East, RN. Post procedure Diagnosis Wound #1: Same as Pre-Procedure Plan Follow-up Appointments: Return Appointment in 2 weeks. - Dr. Celine Ahr rm 3 Anesthetic: Wound #1 Left,Posterior Lower Leg: (In clinic) Topical Lidocaine 5% applied to wound bed Bathing/ Shower/ Hygiene: May shower with protection but do not get wound dressing(s) wet. - purchase cast protector from [facility], [facility] or [employer] Edema Control - Lymphedema / SCD / Other: Elevate legs to the level of the heart or above for 30 minutes daily and/or when sitting, a frequency of: Avoid standing for long periods of time. Exercise regularly - walking daily Moisturize legs daily. - right leg WOUND #1: - Lower Leg Wound Laterality: Left, Posterior Cleanser: Soap and Water 1 x Per Week/30 Days Discharge Instructions: May shower and wash wound with dial antibacterial soap and water prior to dressing change. Peri-Wound Care: Triamcinolone 15 (g) 1 x Per Week/30 Days Discharge Instructions: Use triamcinolone 15 (g) as directed Peri-Wound Care: Sween Lotion (Moisturizing lotion) 1 x Per Week/30 Days Discharge Instructions: Apply moisturizing lotion as directed Secondary Dressing: ABD Pad, 8x10 1 x Per Week/30 Days Discharge Instructions: Apply over primary dressing as directed. Secondary Dressing: Woven Gauze Sponge, Non-Sterile 4x4 in 1 x Per Week/30 Days Discharge Instructions: Apply over primary dressing as directed. Secured With: Coban Self-Adherent Wrap 4x5 (in/yd) 1 x Per Week/30 Days Discharge Instructions: Secure with Coban as directed. Secured With: [employer], 4.5x3.1 (in/yd) 1 x Per Week/30 Days Discharge Instructions: Secure with Kerlix as directed. 12/15/2021: Her wound is smaller  and more superficial. There is slough accumulation on the surface. Her edema control is abysmal. I used a curette to debride the slough from her wound. As she was unable to afford [place], we we will continue to use topical gentamicin with silver alginate, Kerlix and Coban wrap. Follow-up in 1 week. JMYA, FLOYD ([id number]) [id number] Page 7 of 8 Electronic Signature(s) Signed: 12/15/2021 9:57:23 AM By: Fredirick Maudlin MD FACS Entered By: Fredirick Maudlin on 12/15/2021 09:57:23 -------------------------------------------------------------------------------- HxROS Details Patient Name: Date of Service: JASHAUNA, Christine [name] 12/15/2021 8:45 A M Medical Record Number: [id number] Patient Account Number: [id number] Date of Birth/Sex: Treating RN: 1955/05/01 (66 y.o. F) Primary Care Provider: Earlean Polka Other Clinician: Referring Provider: Treating Provider/Extender: Elizebeth Koller in Treatment: 5 Information Obtained From Patient Hematologic/Lymphatic Medical History: Positive for: Lymphedema Cardiovascular Medical History: Positive for: Congestive Heart Failure; Hypertension Past  Medical History Notes: cardiomyopathy hyperlipidemia Psychiatric Medical History: Past Medical History Notes: depression/anxiety Immunizations Pneumococcal Vaccine: Received Pneumococcal Vaccination: No Implantable Devices None Hospitalization / Surgery History Type of Hospitalization/Surgery heart cath 10/15/21 Family and Social History Cancer: Yes - Mother; Diabetes: Yes - Maternal Grandparents; Hypertension: Yes - Father; Lung Disease: Yes - Father; Stroke: Yes - Maternal Grandparents; Former smoker - quit 20+ years ago; Marital Status - Single; Alcohol Use: Never; Drug Use: No History; Caffeine Use: Never; Financial Concerns: No; Food, Clothing or Shelter Needs: No; Support System Lacking: Yes - lives alone; Transportation Concerns: No Electronic  Signature(s) Signed: 12/15/2021 10:52:19 AM By: Fredirick Maudlin MD FACS Entered By: Fredirick Maudlin on 12/15/2021 09:43:29 Kazmi, Tameca ([id number]) [id number] Page 8 of 8 -------------------------------------------------------------------------------- SuperBill Details Patient Name: Date of Service: Christine Jacobson, STINNER 12/15/2021 Medical Record Number: [id number] Patient Account Number: [id number] Date of Birth/Sex: Treating RN: 11/18/1955 (66 y.o. F) Primary Care Provider: Earlean Polka Other Clinician: Referring Provider: Treating Provider/Extender: Elizebeth Koller in Treatment: 5 Diagnosis Coding ICD-10 Codes Code Description [phone] Non-pressure chronic ulcer of left calf with other specified severity I87.332 Chronic venous hypertension (idiopathic) with ulcer and inflammation of left lower extremity L03.116 Cellulitis of left lower limb Facility Procedures : CPT4 Code: [id number] Description: [id number] - DEBRIDE WOUND 1ST 20 SQ CM OR < ICD-10 Diagnosis Description L97.228 Non-pressure chronic ulcer of left calf with other specified severity Modifier: Quantity: 1 Physician Procedures : CPT4 Code Description Modifier [id number] - WC PHYS LEVEL 4 - EST PT 25 ICD-10 Diagnosis Description L97.228 Non-pressure chronic ulcer of left calf with other specified severity I87.332 Chronic venous hypertension (idiopathic) with ulcer and  inflammation of left lower extremity Quantity: 1 : [id number] - WC PHYS DEBR WO ANESTH 20 SQ CM ICD-10 Diagnosis Description L97.228 Non-pressure chronic ulcer of left calf with other specified severity Quantity: 1 Electronic Signature(s) Signed: 12/15/2021 9:57:39 AM By: Fredirick Maudlin MD FACS Entered By: Fredirick Maudlin on 12/15/2021 09:57:39

## 2021-12-17 NOTE — Telephone Encounter (Signed)
Called pt she set up someone to stay with her overnight, she states that she would like this done ASAP.

## 2021-12-20 ENCOUNTER — Ambulatory Visit: Payer: Medicare Other | Admitting: Cardiology

## 2021-12-24 ENCOUNTER — Other Ambulatory Visit: Payer: Self-pay | Admitting: Cardiology

## 2021-12-24 DIAGNOSIS — I5031 Acute diastolic (congestive) heart failure: Secondary | ICD-10-CM

## 2021-12-28 ENCOUNTER — Ambulatory Visit (INDEPENDENT_AMBULATORY_CARE_PROVIDER_SITE_OTHER): Payer: Medicare Other | Admitting: Psychologist

## 2021-12-28 ENCOUNTER — Telehealth: Payer: Self-pay | Admitting: Cardiology

## 2021-12-28 DIAGNOSIS — F331 Major depressive disorder, recurrent, moderate: Secondary | ICD-10-CM

## 2021-12-28 NOTE — Progress Notes (Signed)
[place] Counselor Initial Adult Exam  Name: Christine Jacobson Date: 12/28/2021 MRN: [id number] DOB: 30-May-1955 PCP: Olen Cordial, PA  Time spent: 08:04 am to 08:40 am; total time: 36 minutes  This session was held via phone teletherapy due to the coronavirus risk at this time. The patient consented to phone teletherapy and was located at her home during this session. She is aware it is the responsibility of the patient to secure confidentiality on her end of the session. The provider was in a private home office for the duration of this session. Limits of confidentiality were discussed with the patient.   Guardian/Payee:  NA    Paperwork requested: No   Reason for Visit /Presenting Problem: Depression  Mental Status Exam: Appearance:   NA     Behavior:  Appropriate  Motor:  NA  Speech/Language:   Clear and Coherent  Affect:  Appropriate  Mood:  normal  Thought process:  normal  Thought content:    WNL  Sensory/Perceptual disturbances:    WNL  Orientation:  oriented to person, place, and time/date  Attention:  Good  Concentration:  Good  Memory:  WNL  Fund of knowledge:   Good  Insight:    Good  Judgment:   Good  Impulse Control:  Good     Reported Symptoms:  The patient endorsed experiencing the following: feeling down, sad, tearful, social isolation, avoiding pleasurable activities, fatigue, low self-esteem, and rumination of negative thoughts. She denied suicidal and homicidal ideation.   Risk Assessment: Danger to Self:  No Self-injurious Behavior: No Danger to Others: No Duty to Warn:no Physical Aggression / Violence:No  Access to Firearms a concern: No  Gang Involvement:No  Patient / guardian was educated about steps to take if suicide or homicide risk level increases between visits: n/a While future psychiatric events cannot be accurately predicted, the patient does not currently require acute inpatient psychiatric care and does not currently  meet [facility] involuntary commitment criteria.  Substance Abuse History: Current substance abuse: No     Past Psychiatric History:   Previous psychological history is significant for depression Outpatient Providers:NA History of Psych Hospitalization: No  Psychological Testing:  NA    Abuse History:  Victim of: No.,  NA    Report needed: No. Victim of Neglect:No. Perpetrator of  na   Witness / Exposure to Domestic Violence: No   Protective Services Involvement: No  Witness to [employer] Violence:  No   Family History:  Family History  Problem Relation Age of Onset   Heart disease Mother        endocarditis   Ovarian cancer Mother    Emphysema Father    Coronary artery disease Other        F in his 51   Diabetes Other        GP   Cancer Neg Hx     Living situation: the patient lives alone  Sexual Orientation: Straight  Relationship Status: single  Name of spouse / other:NA If a parent, number of children / ages:NA  Support Systems: Friends  Museum/gallery curator Stress:  No   Income/Employment/Disability: Actor: No   Educational History: Education: post Forensic psychologist work or degree  Religion/Sprituality/World View: Jewish  Any cultural differences that may affect / interfere with treatment:  not applicable   Recreation/Hobbies: Cook and bake  Stressors: Health problems    Strengths: Conservator, museum/gallery  Barriers:  NA   Legal History: Pending legal issue /  charges: The patient has no significant history of legal issues. History of legal issue / charges:  NA  Medical History/Surgical History: reviewed Past Medical History:  Diagnosis Date   Chest pain 11/22/2010   2D STRESS ECHO - EF 60%, peak stress EF 80%, normal, no evidence for stress-induced ischemia   HOCM (hypertrophic obstructive cardiomyopathy) ([place]) 06/21/2011   2D ECHO - EF >55%, normal   HTN (hypertension)    Hyperprolactinemia (HCC)    dx in her 18,  took meds, self d/c a while back   Liver function test abnormality    normal when repeated   Mild hyperlipidemia    Obesity    Palpitations    negative stress echo in November 2012 with normal LV function; mild LVH, proximal septal thickening with narrow LVOT and mild gradient; mild MR and TR; Cardionet showed PACs in November 2012   Pyoderma gangrenosa    Shortness of breath 07/11/2011   MET TEST    Past Surgical History:  Procedure Laterality Date   RIGHT HEART CATH N/A 10/15/2021   Procedure: RIGHT HEART CATH;  Surgeon: Nigel Mormon, MD;  Location: [facility];  Service: Cardiovascular;  Laterality: N/A;   SKIN GRAFT  2006   porcine, R leg    Medications: Current Outpatient Medications  Medication Sig Dispense Refill   apixaban (ELIQUIS) 5 MG TABS tablet Take 1 tablet (5 mg total) by mouth 2 (two) times daily. 60 tablet 5   diltiazem (CARDIZEM CD) 180 MG 24 hr capsule Take 180 mg by mouth daily.     ibuprofen (ADVIL) 200 MG tablet Take 400 mg by mouth every 8 (eight) hours as needed for moderate pain.     metoprolol succinate (TOPROL-XL) 100 MG 24 hr tablet TAKE ONE TABLET BY MOUTH TWICE A DAY WITH OR IMMEDIATELY FOLLOWING A MEAL (Patient taking differently: Take 100 mg by mouth in the morning and at bedtime.) 180 tablet 3   potassium chloride SA (KLOR-CON M) 20 MEQ tablet Take 20 mEq by mouth daily.     sertraline (ZOLOFT) 50 MG tablet Take 1 tablet (50 mg total) by mouth at bedtime. 30 tablet 2   torsemide (DEMADEX) 20 MG tablet TAKE 2 TABLETS BY MOUTH DAILY 60 tablet 3   No current facility-administered medications for this visit.    No Known Allergies  Diagnoses:  F33.1 major depressive affective disorder, recurrent, moderate   Plan of Care: The patient is a 66 year old Caucasian female who was referred due to experiencing depression secondary to congestive heart failure. The patient lives alone. The patient meets criteria for a diagnosis of F33.1 major  depressive affective disorder, recurrent, moderate based off of the following: feeling down, sad, tearful, social isolation, avoiding pleasurable activities, fatigue, low self-esteem, and rumination of negative thoughts. She denied suicidal and homicidal ideation.   The patient wants to process her thoughts and emotions secondary to living with congestive heart failure. Patient wants to get her smile back. Patient wants to get back on her feet.   This psychologist makes the recommendation that the patient participate in therapy bi-weekly if possible.    Conception Chancy, PsyD

## 2021-12-28 NOTE — Progress Notes (Signed)
                Keedan Sample, PsyD 

## 2021-12-28 NOTE — Telephone Encounter (Signed)
Returned call to patient, she wanted to make [place] aware she had to reschedule her cardioversion to 12/22.   Her friend who was driving her and she was going to spend the night with her after tested positive for covid.  Will make MD aware.

## 2021-12-28 NOTE — Telephone Encounter (Signed)
Calling about her procedure on 12/14. Please advise

## 2021-12-29 ENCOUNTER — Encounter (HOSPITAL_BASED_OUTPATIENT_CLINIC_OR_DEPARTMENT_OTHER): Payer: Medicare Other | Admitting: Internal Medicine

## 2021-12-30 ENCOUNTER — Telehealth: Payer: Self-pay | Admitting: Cardiology

## 2021-12-30 NOTE — Telephone Encounter (Signed)
Spoke with patient of Dr. Servando Salina - she had to r/s her cardioversion from 12/14 (today) to 12/22. She reports new-onset shortness of breath today and is audibly SOB on the phone   She does not have a way to check BP or pulse.   Advised she call EMS and have them assess her. If needed, they can transport her to ED (possibly for cardioversion or at least stabilization). She agreed w/plan

## 2021-12-30 NOTE — Telephone Encounter (Signed)
  Pt is calling back and requesting to speak with Dr. Mallory Shirk nurse again

## 2021-12-31 ENCOUNTER — Encounter (HOSPITAL_BASED_OUTPATIENT_CLINIC_OR_DEPARTMENT_OTHER): Payer: Medicare Other | Admitting: Internal Medicine

## 2022-01-03 ENCOUNTER — Telehealth: Payer: Self-pay | Admitting: Cardiology

## 2022-01-03 NOTE — Telephone Encounter (Signed)
Asking that the nurse gives her a call. Please advise

## 2022-01-03 NOTE — Telephone Encounter (Signed)
Called patient, she states that she woke this morning not feeling well, she called EMS, once they got there everything was fine (BP/HR) was controlled. She had taken her morning medications, and they believed this was helping. Patient states she was coming today for the blood work however, she is coming tomorrow instead.   Patient aware this is okay, she can have it drawn tomorrow.   Patient verbalized understanding, thankful for call back.

## 2022-01-04 ENCOUNTER — Telehealth: Payer: Self-pay | Admitting: *Deleted

## 2022-01-04 NOTE — Telephone Encounter (Signed)
Late entry   Patient  arrived to office  to have lab drawn today.  Front Lawyer called triage to state . Patient is having a difficulty with breathing very short of breath.   Rn went to see patient.  Patient was sitting in office lobby . She states she came to labs done for upcoming  procedure 01/07/22   Visible not short of breath  ,color good.   Patient was able to answer question with trouble   Alert and orient  Pulse oxygen 94%  pulse 68.  Patient states she very short of breath yesterday - and called EMS - they offered to take her ER she declined.  She states she becomes very winded with any activity.  Rn reviewed with patient doctor -   proceed with getting labs,  if symptoms worsen go to ER   RN  instructed patient will use a wheelchair to have lab done and then escort her  car . Since she came by herself.   Patient had labs done without issue. RN  rechecked  oxygen saturation  91% and pulse 55. RN escorted patient to her car by wheelchair. Patient  was able to lift out of chair without assistance not short of breath. And drive home. RN informed patient if  issue become worse  contact EMS and go to ER

## 2022-01-05 ENCOUNTER — Telehealth: Payer: Self-pay | Admitting: Cardiology

## 2022-01-05 LAB — CBC
Hematocrit: 37.2 % (ref 34.0–46.6)
Hemoglobin: 12.8 g/dL (ref 11.1–15.9)
MCH: 33 pg (ref 26.6–33.0)
MCHC: 34.4 g/dL (ref 31.5–35.7)
MCV: 96 fL (ref 79–97)
Platelets: 192 10*3/uL (ref 150–450)
RBC: 3.88 x10E6/uL (ref 3.77–5.28)
RDW: 14.2 % (ref 11.7–15.4)
WBC: 7.2 10*3/uL (ref 3.4–10.8)

## 2022-01-05 LAB — BASIC METABOLIC PANEL
BUN/Creatinine Ratio: 10 — ABNORMAL LOW (ref 12–28)
BUN: 11 mg/dL (ref 8–27)
CO2: 22 mmol/L (ref 20–29)
Calcium: 9.9 mg/dL (ref 8.7–10.3)
Chloride: 105 mmol/L (ref 96–106)
Creatinine, Ser: 1.13 mg/dL — ABNORMAL HIGH (ref 0.57–1.00)
Glucose: 118 mg/dL — ABNORMAL HIGH (ref 70–99)
Potassium: 5.2 mmol/L (ref 3.5–5.2)
Sodium: 143 mmol/L (ref 134–144)
eGFR: 53 mL/min/{1.73_m2} — ABNORMAL LOW (ref >59)

## 2022-01-05 NOTE — Telephone Encounter (Signed)
Spoke with patient of Dr. Servando Salina who is scheduled for a cardioversion on 01/07/22. She has missed 3-4 days of eliquis b/c she ran out and cannot get out to get it filled until tomorrow. She said she needs to r/s. Can move procedure to Jan 16 with Dr. Servando Salina and she would like that.   Will run this by Dr. Servando Salina also - Dr. Servando Salina said to r/s

## 2022-01-05 NOTE — Telephone Encounter (Signed)
Called scheduling and moved DCCV to Feb 01, 2022 with Dr. Servando Salina - 12pm -- 11am arrival. Advised her instructions are same as provided previously just different date and provider

## 2022-01-05 NOTE — Telephone Encounter (Signed)
Pt states she would like to speak to Dr. Mallory Shirk RN in regards to her upcoming procedure she believes she may need to cancel due to taking medication she believes she was not suppose to take. Requesting call back.

## 2022-01-11 ENCOUNTER — Telehealth: Payer: Self-pay | Admitting: Cardiology

## 2022-01-11 ENCOUNTER — Encounter (HOSPITAL_BASED_OUTPATIENT_CLINIC_OR_DEPARTMENT_OTHER): Payer: Medicare Other | Attending: General Surgery | Admitting: General Surgery

## 2022-01-11 NOTE — Telephone Encounter (Signed)
Pt c/o Shortness Of Breath: STAT if SOB developed within the last 24 hours or pt is noticeably SOB on the phone  1. Are you currently SOB (can you hear that pt is SOB on the phone)?   A little  2. How long have you been experiencing SOB?   Comes on every day  3. Are you SOB when sitting or when up moving around?   When moving around  4. Are you currently experiencing any other symptoms?   Dizziness  Patient stated she has dizziness and would like to speak with someone about her symptoms.

## 2022-01-11 NOTE — Telephone Encounter (Signed)
Patient stated she is concerned that she is not getting any better. She reported exertional SOB and dizziness. Gave her a list of things to avoid regarding afib (caffeine, chocolate ETOH, dehydration) Patient stated she had wine over the weekend  and that's when she started to feel bad. Recommended that she stay hydrated, and that if SOB occurs at rest, to be taken to the ED. She verbalized understanding.

## 2022-01-12 ENCOUNTER — Encounter (HOSPITAL_BASED_OUTPATIENT_CLINIC_OR_DEPARTMENT_OTHER): Payer: Medicare Other | Attending: General Surgery | Admitting: General Surgery

## 2022-01-12 DIAGNOSIS — L03116 Cellulitis of left lower limb: Secondary | ICD-10-CM | POA: Diagnosis not present

## 2022-01-12 DIAGNOSIS — I509 Heart failure, unspecified: Secondary | ICD-10-CM | POA: Insufficient documentation

## 2022-01-12 DIAGNOSIS — L97228 Non-pressure chronic ulcer of left calf with other specified severity: Secondary | ICD-10-CM | POA: Diagnosis not present

## 2022-01-12 DIAGNOSIS — I110 Hypertensive heart disease with heart failure: Secondary | ICD-10-CM | POA: Diagnosis not present

## 2022-01-12 DIAGNOSIS — I87332 Chronic venous hypertension (idiopathic) with ulcer and inflammation of left lower extremity: Secondary | ICD-10-CM | POA: Insufficient documentation

## 2022-01-12 NOTE — Progress Notes (Addendum)
Goodlin, Masako ([id number]) [id number] Page 1 of 6 Visit Report for 01/12/2022 Arrival Information Details Patient Name: Date of Service: Christine Jacobson, Christine Jacobson 01/12/2022 10:45 A M Medical Record Number: [id number] Patient Account Number: [id number] Date of Birth/Sex: Treating RN: [birth year] (66 y.o. Christine Jacobson Primary Care Jaece Ducharme: Erma Heritage Other Clinician: Referring Benjimen Kelley: Treating Jonny Longino/Extender: Janee Morn, [place] LEB Weeks in Treatment: 9 Visit Information History Since Last Visit Added or deleted any medications: No Patient Arrived: Ambulatory Any new allergies or adverse reactions: No Arrival Time: 10:37 Had a fall or experienced change in No Accompanied By: self activities of daily living that may affect Transfer Assistance: None risk of falls: Patient Identification Verified: Yes Signs or symptoms of abuse/neglect since last visito No Patient Requires Transmission-Based Precautions: No Hospitalized since last visit: No Patient Has Alerts: No Implantable device outside of the clinic excluding No cellular tissue based products placed in the center since last visit: Has Dressing in Place as Prescribed: Yes Pain Present Now: No Electronic Signature(s) Signed: 01/12/2022 5:27:24 PM By: Karie Schwalbe RN Entered By: Karie Schwalbe on 01/12/2022 10:38:24 -------------------------------------------------------------------------------- Clinic Level of Care Assessment Details Patient Name: Date of Service: Christine [name] 01/12/2022 10:45 A M Medical Record Number: [id number] Patient Account Number: [id number] Date of Birth/Sex: Treating RN: [birth year] (66 y.o. F) [place], Randa Evens Primary Care Nealy Karapetian: Erma Heritage Other Clinician: Referring Zyiah Withington: Treating Chenell Lozon/Extender: Janee Morn, [place] LEB Weeks in Treatment: 9 Clinic Level of Care Assessment Items TOOL 4 Quantity Score X- 1 0 Use when only an  EandM is performed on FOLLOW-UP visit ASSESSMENTS - Nursing Assessment / Reassessment X- 1 10 Reassessment of Co-morbidities (includes updates in patient status) X- 1 5 Reassessment of Adherence to Treatment Plan ASSESSMENTS - Wound and Skin A ssessment / Reassessment X - Simple Wound Assessment / Reassessment - one wound 1 5 []  - 0 Complex Wound Assessment / Reassessment - multiple wounds []  - 0 Dermatologic / Skin Assessment (not related to wound area) ASSESSMENTS - Focused Assessment []  - 0 Circumferential Edema Measurements - multi extremities []  - 0 Nutritional Assessment / Counseling / Intervention Malinoski, Alessandra ( ) [phone].pdf Page 2 of 6 []  - 0 Lower Extremity Assessment (monofilament, tuning fork, pulses) []  - 0 Peripheral Arterial Disease Assessment (using hand held doppler) ASSESSMENTS - Ostomy and/or Continence Assessment and Care []  - 0 Incontinence Assessment and Management []  - 0 Ostomy Care Assessment and Management (repouching, etc.) PROCESS - Coordination of Care X - Simple Patient / Family Education for ongoing care 1 15 []  - 0 Complex (extensive) Patient / Family Education for ongoing care X- 1 10 Staff obtains , Records, T Results / Process Orders est X- 1 10 Staff telephones HHA, Nursing Homes / Clarify orders / etc []  - 0 Routine Transfer to another Facility (non-emergent condition) []  - 0 Routine Hospital Admission (non-emergent condition) []  - 0 New Admissions / / Ordering NPWT Apligraf, etc. , []  - 0 Emergency Hospital Admission (emergent condition) X- 1 10 Simple Discharge Coordination []  - 0 Complex (extensive) Discharge Coordination PROCESS - Special Needs []  - 0 Pediatric / Minor Patient Management []  - 0 Isolation Patient Management []  - 0 Hearing / Language / Visual special needs []  - 0 Assessment of Community assistance (transportation, D/C planning, etc.) []   - 0 Additional assistance / Altered mentation []  - 0 Support Surface(s) Assessment (bed, cushion, seat, etc.) INTERVENTIONS - Wound Cleansing / Measurement X - Simple Wound Cleansing - one wound 1  5 []  - 0 Complex Wound Cleansing - multiple wounds []  - 0 Wound Imaging (photographs - any number of wounds) []  - 0 Wound Tracing (instead of photographs) []  - 0 Simple Wound Measurement - one wound []  - 0 Complex Wound Measurement - multiple wounds INTERVENTIONS - Wound Dressings []  - 0 Small Wound Dressing one or multiple wounds X- 1 15 Medium Wound Dressing one or multiple wounds []  - 0 Large Wound Dressing one or multiple wounds []  - 0 Application of Medications - topical []  - 0 Application of Medications - injection INTERVENTIONS - Miscellaneous []  - 0 External ear exam []  - 0 Specimen Collection (cultures, biopsies, blood, body fluids, etc.) []  - 0 Specimen(s) / Culture(s) sent or taken to Lab for analysis []  - 0 Patient Transfer (multiple staff / / Similar devices) []  - 0 Simple Staple / Suture removal (25 or less) []  - 0 Complex Staple / Suture removal (26 or more) []  - 0 Hypo / Hyperglycemic Management (close monitor of Blood Glucose) Burgeson, Shaylea ( ) [phone].pdf Page 3 of 6 []  - 0 Ankle / Brachial Index (ABI) - do not check if billed separately X- 1 5 Vital Signs Has the patient been seen at the hospital within the last three years: Yes Total Score: 90 Level Of Care: New/Established - Level 3 Electronic Signature(s) Signed: 01/12/2022 5:27:24 PM By: RN Entered By: on 01/12/2022 17:26:07 -------------------------------------------------------------------------------- Encounter Discharge Information Details Patient Name: Date of Service: Christine Jacobson, Christine Jacobson 01/12/2022 10:45 A M Medical Record Number: Patient Account Number: Date of Birth/Sex: Treating  RN: [birth year] (66 y.o. Nurse, adult Primary Care Trea Latner: Other Clinician: Referring Tyrail Grandfield: Treating Isaiah Cianci/Extender: , [place] LEB Weeks in Treatment: 9 Encounter Discharge Information Items Discharge Condition: Stable Ambulatory Status: Ambulatory Discharge Destination: Home Transportation: Private Auto Accompanied By: self Schedule Follow-up Appointment: Yes Clinical Summary of Care: Patient Declined Electronic Signature(s) Signed: 01/12/2022 5:27:24 PM By: [id number] RN Entered By: [id number] on 01/12/2022 17:26:40 -------------------------------------------------------------------------------- Multi-Disciplinary Care Plan Details Patient Name: Date of Service: Christine Jacobson, Christine Jacobson 01/12/2022 10:45 A M Medical Record Number: Karie Schwalbe Patient Account Number: 01/14/2022 Date of Birth/Sex: Treating RN: 1955/06/01 (66 y.o. [id number] Primary Care Emanuela Runnion: [id number] Other Clinician: Referring Tristyn Demarest: Treating Yecenia Dalgleish/Extender: 05/27/1955, [place] LEB Weeks in Treatment: 9 Active Inactive Electronic Signature(s) Signed: 02/19/2022 8:32:31 AM By: Christine Blazing RN, BSN Entered By: Erma Heritage on 02/19/2022 08:32:30 Warmack, Barri (12/29/2023Karie Schwalbe.pdf Page 4 of 6 -------------------------------------------------------------------------------- Patient/Caregiver Education Details Patient Name: Date of Service: Christine Jacobson, Christine Jacobson 12/27/2023andnbsp10:45 A M Medical Record Number: Elpidio Eric Patient Account Number: 01/14/2022 Date of Birth/Gender: Treating RN: [birth year] (66 y.o. 05/27/1955 Primary Care Physician: 73 Other Clinician: Referring Physician: Treating Physician/Extender: Arta Silence, [place] LEB Weeks in Treatment: 9 Education Assessment Education Provided To: Patient Education Topics Provided Wound/Skin Impairment: Methods:  Explain/Verbal Responses: Return demonstration correctly Electronic Signature(s) Signed: 01/12/2022 5:27:24 PM By: Janee Morn RN Entered By: 04/20/2022 on 01/12/2022 17:26:26 -------------------------------------------------------------------------------- Wound Assessment Details Patient Name: Date of Service: Christine Jacobson, Christine [name] 01/12/2022 10:45 A M Medical Record Number: [id number] Patient Account Number: ) [id number] Date of Birth/Sex: Treating RN: 12/09/1955 (66 y.o. [id number] Primary Care Agustus Mane: [id number] Other Clinician: Referring Keandra Medero: Treating Koston Hennes/Extender: 05/27/1955, [place] LEB Weeks in Treatment: 9 Wound Status Wound Number: 1 Primary Etiology: Venous Leg Ulcer Wound Location: Left, Posterior Lower Leg Wound Status: Open  Wounding Event: Gradually Appeared Notes: wound has combined with lateral wound Date Acquired: 08/17/2021 Comorbid History: Lymphedema, Congestive Heart Failure, Hypertension Weeks Of Treatment: 9 Clustered Wound: No Wound Measurements Length: (cm) 6 Width: (cm) 2 Depth: (cm) 0.1 Area: (cm) 9.425 Volume: (cm) 0.942 % Reduction in Area: 81% % Reduction in Volume: 90.5% Epithelialization: Small (1-33%) Tunneling: No Undermining: No Wound Description Classification: Full Thickness Without Exposed Suppor Wound Margin: Distinct, outline attached Exudate Amount: Medium Exudate Type: Serosanguineous Exudate Color: red, brown t Structures Foul Odor After Cleansing: No Slough/Fibrino Yes Wound Bed Granulation Amount: Large (67-100%) Exposed Structure Granulation Quality: Pink Fascia Exposed: No Necrotic Amount: Small (1-33%) Fat Layer (Subcutaneous Tissue) Exposed: Yes Necrotic Quality: Adherent Slough Tendon Exposed: No Muscle Exposed: No Joint Exposed: No Bone Exposed: No Christine Jacobson, Christine Jacobson ([id number]) [id number].pdf Page 5 of 6 Periwound Skin Texture Texture Color No  Abnormalities Noted: No No Abnormalities Noted: Yes Scarring: Yes Temperature / Pain Temperature: No Abnormality Moisture No Abnormalities Noted: No Tenderness on Palpation: Yes Dry / Scaly: Yes Maceration: No Treatment Notes Wound #1 (Lower Leg) Wound Laterality: Left, Posterior Cleanser Soap and Water Discharge Instruction: May shower and wash wound with dial antibacterial soap and water prior to dressing change. Peri-Wound Care Triamcinolone 15 (g) Discharge Instruction: Use triamcinolone 15 (g) as directed Sween Lotion (Moisturizing lotion) Discharge Instruction: Apply moisturizing lotion as directed Topical Primary Dressing Secondary Dressing ABD Pad, 8x10 Discharge Instruction: Apply over primary dressing as directed. Woven Gauze Sponge, Non-Sterile 4x4 in Discharge Instruction: Apply over primary dressing as directed. Secured With [employer] 4x5 (in/yd) Discharge Instruction: Secure with Coban as directed. Kerlix Roll Sterile, 4.5x3.1 (in/yd) Discharge Instruction: Secure with Kerlix as directed. Compression Wrap Compression Stockings Add-Ons Electronic Signature(s) Signed: 01/12/2022 5:27:24 PM By: Karie Schwalbe RN Entered By: Karie Schwalbe on 01/12/2022 11:05:21 -------------------------------------------------------------------------------- Vitals Details Patient Name: Date of Service: Christine Jacobson, Christine Jacobson 01/12/2022 10:45 A M Medical Record Number: [id number] Patient Account Number: [id number] Date of Birth/Sex: Treating RN: 16-Feb-1955 (66 y.o. Christine Jacobson Primary Care Siona Coulston: Erma Heritage Other Clinician: Referring Atlee Villers: Treating Brently Voorhis/Extender: Janee Morn, [place] LEB Weeks in Treatment: 9 Vital Signs Time Taken: 10:39 Temperature (F): 97.8 Height (in): 64 Pulse (bpm): 88 Weight (lbs): 203 Respiratory Rate (breaths/min): 18 Body Mass Index (BMI): 34.8 Blood Pressure (mmHg): 158/87 Reference Range: 80 - 120  mg / dl Christine Jacobson, Christine Jacobson ([id number]) [phone].pdf Page 6 of 6 Electronic Signature(s) Signed: 01/12/2022 5:27:24 PM By: Karie Schwalbe RN Entered By: Karie Schwalbe on 01/12/2022 10:39:33

## 2022-01-13 NOTE — Progress Notes (Signed)
MASHELL, SIEBEN ([id number]) [id number] Page 1 of 1 Visit Report for 01/12/2022 SuperBill Details Patient Name: Date of Service: Christine Jacobson, Christine Jacobson 01/12/2022 Medical Record Number: [id number] Patient Account Number: [id number] Date of Birth/Sex: Treating RN: 1955/02/24 (66 y.o. 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

## 2022-01-14 ENCOUNTER — Telehealth: Payer: Self-pay | Admitting: Cardiology

## 2022-01-14 NOTE — Telephone Encounter (Signed)
Patient would like to speak with Dr. Mallory Shirk nurse/CMA.She states she just recently got engaged and now has a lot more life to live and will needs Dr. Mallory Shirk assistance with her health going forward. Patient asked that I pass this along.

## 2022-01-14 NOTE — Telephone Encounter (Signed)
Routed to Dr. Servando Salina and [facility] CMA

## 2022-01-24 ENCOUNTER — Encounter (HOSPITAL_BASED_OUTPATIENT_CLINIC_OR_DEPARTMENT_OTHER): Payer: Medicare Other | Admitting: General Surgery

## 2022-01-25 ENCOUNTER — Ambulatory Visit (HOSPITAL_BASED_OUTPATIENT_CLINIC_OR_DEPARTMENT_OTHER): Payer: Medicare Other | Admitting: General Surgery

## 2022-01-27 ENCOUNTER — Telehealth: Payer: Self-pay | Admitting: Cardiology

## 2022-01-27 ENCOUNTER — Other Ambulatory Visit: Payer: Self-pay

## 2022-01-27 ENCOUNTER — Ambulatory Visit (INDEPENDENT_AMBULATORY_CARE_PROVIDER_SITE_OTHER): Payer: Medicare Other | Admitting: Psychologist

## 2022-01-27 DIAGNOSIS — F331 Major depressive disorder, recurrent, moderate: Secondary | ICD-10-CM | POA: Diagnosis not present

## 2022-01-27 DIAGNOSIS — Z0189 Encounter for other specified special examinations: Secondary | ICD-10-CM

## 2022-01-27 NOTE — Progress Notes (Signed)
[place] Counselor/Therapist Progress Note  Patient ID: Christine Jacobson, MRN: [id number],    Date: 01/27/2022  Time Spent: 11:02 am to 11:42 am; total time: 40 minutes   This session was held via phone teletherapy due to the coronavirus risk at this time. The patient consented to phone teletherapy and was located at her home during this session. She is aware it is the responsibility of the patient to secure confidentiality on her end of the session. The provider was in a private home office for the duration of this session. Limits of confidentiality were discussed with the patient.   Treatment Type: Individual Therapy  Reported Symptoms: Depression and some stress related to upcoming cardioversion   Mental Status Exam: Appearance:  NA     Behavior: Appropriate  Motor: NA  Speech/Language:  Clear and Coherent  Affect: Appropriate  Mood: normal  Thought process: normal  Thought content:   WNL  Sensory/Perceptual disturbances:   WNL  Orientation: oriented to person, place, and time/date  Attention: Good  Concentration: Good  Memory: WNL  Fund of knowledge:  Good  Insight:   Fair  Judgment:  Good  Impulse Control: Good   Risk Assessment: Danger to Self:  No Self-injurious Behavior: No Danger to Others: No Duty to Warn:no Physical Aggression / Violence:No  Access to Firearms a concern: No  Gang Involvement:No   Subjective: Beginning the session, patient described herself as okay and voiced that she has had challenges since the intake. Specifically, per the patient her heart function has become worse, resulting in undergoing a cardioversion this upcoming Tuesday. She voiced some stress related to it. Patient participated in coping strategies describing them as helpful. She processed thoughts and emotions. She was agreeable to homework and following up. She denied suicidal and homicidal ideation.    Interventions:  Worked on developing a therapeutic relationship  with the patient using active listening and reflective statements. Provided emotional support using empathy and validation. Reviewed the treatment plan. Reviewed events since the intake. Normalized and validated thoughts and emotions. Processed emotions related to the upcoming cardioversion. Identified goals for the session. Provided psychoeducation about guided imagery and externalizing thoughts. Practiced and processed guided imagery. Praised patient for experiencing less distress. Assisted in problem solving. Briefly provided psychoeducation about cardio rehabilitation and encouraged patient to speak with Dr. Harriet Masson about that option. Provided empathic statements. Assigned homework. Assessed for suicidal and homicidal ideation.   Homework: Implement coping strategies  Next Session: Review homework. Cognitive defusion and emotional support  Diagnosis: F33.1 major depressive affective disorder, recurrent, moderate  Plan:  Goals Work through the grieving process and face reality of own death Accept emotional support from others around them Live life to the fullest, event though time may be limited Become as knowledgeable about the medical condition  Reduce fear, anxiety about the health condition  Accept the illness Accept the role of psychological and behavioral factors  Stabilize anxiety level wile increasing ability to function Learn and implement coping skills that result in a reduction of anxiety  Alleviate depressive symptoms Recognize, accept, and cope with depressive feelings Develop healthy thinking patterns Develop healthy interpersonal relationships  Objectives target date for all objectives is 12/29/2022 Identify feelings associated with the illness Family members share with each other feelings Identify the losses or limitations that have been experienced Verbalize acceptance of the reality of the medical condition Commit to learning and implement a proactive approach to  managing personal stresses Verbalize an understanding of the medical condition Work  with therapist to develop a plan for coping with stress Learn and implement skills for managing stress Engage in social, productive activities that are possible Engage in faith based activities implement positive imagery Identify coping skills and sources of emotional support Patient's partner and family members verbalize their fears regarding severity of health condition Identify sources of emotional distress  Learning and implement calming skills to reduce overall anxiety Learn and implement problem solving strategies Identify and engage in pleasant activities Learning and implement personal and interpersonal skills to reduce anxiety and improve interpersonal relationships Learn to accept limitations in life and commit to tolerating, rather than avoiding, unpleasant emotions while accomplishing meaningful goals Identify major life conflicts from the past and present that form the basis for present anxiety Learn and implement behavioral strategies Verbalize an understanding and resolution of current interpersonal problems Learn and implement problem solving and decision making skills Learn and implement conflict resolution skills to resolve interpersonal problems Verbalize an understanding of healthy and unhealthy emotions verbalize insight into how past relationships may be influence current experiences with depression Use mindfulness and acceptance strategies and increase value based behavior  Increase hopeful statements about the future.   Interventions Teach about stress and ways to handle stress Assist the patient in developing a coping action plan for stressors Conduct skills based training for coping strategies Train problem focused skills Sort out what activities the individual can do Encourage patient to rely upon his/her spiritual faith Teach the patient to use guided imagery Probe and  evaluate family's ability to provide emotional support Allow family to share their fears Assist the patient in identifying, sorting through, and verbalizing the various feelings generated by his/her medical condition Meet with family members  Ask patient list out limitations  Use stress inoculation training  Use Acceptance and Commitment Therapy to help client accept uncomfortable realities in order to accomplish value-consistent goals Reinforce the client's insight into the role of his/her past emotional pain and present anxiety  Discuss examples demonstrating that unrealistic worry overestimates the probability of threats and underestimate patient's ability  Assist the patient in analyzing his or her worries Help patient understand that avoidance is reinforcing  Behavioral activation help the client explore the relationship, nature of the dispute,  Help the client develop new interpersonal skills and relationships Conduct Problem so living therapy Teach conflict resolution skills Use a process-experiential approach Conduct TLDP Conduct ACT  The patient and clinician reviewed the treatment plan on 01/27/2022. The patient agreed to the treatment plan.    Conception Chancy, PsyD

## 2022-01-27 NOTE — Telephone Encounter (Addendum)
Patient had a question about blood work before her cardioversion on 1/16. BMET and CBC ordered. She will come for blood work tomorrow. Patient stated she has not missed any doses of eliquis and was instructed to keep taking it. She had not other questions at this time.

## 2022-01-27 NOTE — Telephone Encounter (Signed)
  Pt is requesting to speak with Dr. Harriet Masson or her nurse, she said its regarding her Cardioversion scheduled on Tuesday

## 2022-01-27 NOTE — Progress Notes (Signed)
                Zya Finkle, PsyD 

## 2022-01-28 ENCOUNTER — Telehealth: Payer: Self-pay

## 2022-01-28 ENCOUNTER — Telehealth: Payer: Self-pay | Admitting: Cardiology

## 2022-01-28 NOTE — Telephone Encounter (Addendum)
Called pt. She was very upset and states "I spoke with [facility] yesterday. She was great but I don;t know if I want to go thru with the procedure. She told me they would do an EKG and if I was not in a fib they would not do the procedure. I'm not sure what to do. I want to talk to the doctor. I want her to tell me if I will live with this on a daily basis. I just don't know what to do. I want to cancel the procedure." This RN told the patient to keep the appointment for now, I will relay her message to Dr. Harriet Masson than we will go from there. I requested pt to get the labs drawn today for the possible procedure Tuesday. She state she is having wine and can not drive right now. "I refuse to drink and drive." Will get message to Dr. Harriet Masson for review.

## 2022-01-28 NOTE — Telephone Encounter (Signed)
Patient called to talk only with Dr. Harriet Masson.

## 2022-01-28 NOTE — Telephone Encounter (Signed)
Spoke with Dr. Harriet Masson about pt's concerns. Called pt back to let her know Dr. Harriet Masson recommends she attend her cardioversion appointment Tuesday in order to have the best patient provider relationship. Pt states "I hope the doctor was not upset. I just don't know what to do." Pt informed provider was not upset. Pt states "I'll have the procedure don't Tuesday." I asked pt if she has been taking her Eliquis and if she has a ride for Tuesday. She reports she has been taking it and she has a ride. Pt aware she does not need to have the labs redrawn if the procedure is done on Tuesday due to recent lab draw, however if the procedure is pushed back again she will need to have labs redrawn. She verbalized understanding.

## 2022-02-01 ENCOUNTER — Ambulatory Visit (HOSPITAL_BASED_OUTPATIENT_CLINIC_OR_DEPARTMENT_OTHER): Payer: Medicare Other | Admitting: Anesthesiology

## 2022-02-01 ENCOUNTER — Encounter (HOSPITAL_COMMUNITY)
Admission: RE | Disposition: A | Discharge status: Home / Self Care | Payer: Self-pay | Source: Home / Self Care | Attending: Cardiology

## 2022-02-01 ENCOUNTER — Ambulatory Visit (HOSPITAL_COMMUNITY)
Admission: RE | Admit: 2022-02-01 | Discharge: 2022-02-01 | Disposition: A | Discharge status: Home / Self Care | Payer: Medicare Other | Attending: Cardiology | Admitting: Cardiology

## 2022-02-01 ENCOUNTER — Other Ambulatory Visit: Payer: Self-pay

## 2022-02-01 ENCOUNTER — Ambulatory Visit (HOSPITAL_COMMUNITY): Payer: Medicare Other | Admitting: Anesthesiology

## 2022-02-01 ENCOUNTER — Encounter (HOSPITAL_COMMUNITY): Payer: Self-pay | Admitting: Cardiology

## 2022-02-01 DIAGNOSIS — I4891 Unspecified atrial fibrillation: Secondary | ICD-10-CM

## 2022-02-01 DIAGNOSIS — E7800 Pure hypercholesterolemia, unspecified: Secondary | ICD-10-CM | POA: Insufficient documentation

## 2022-02-01 DIAGNOSIS — Z87891 Personal history of nicotine dependence: Secondary | ICD-10-CM

## 2022-02-01 DIAGNOSIS — I110 Hypertensive heart disease with heart failure: Secondary | ICD-10-CM | POA: Diagnosis not present

## 2022-02-01 DIAGNOSIS — I480 Paroxysmal atrial fibrillation: Secondary | ICD-10-CM | POA: Diagnosis not present

## 2022-02-01 DIAGNOSIS — E669 Obesity, unspecified: Secondary | ICD-10-CM | POA: Insufficient documentation

## 2022-02-01 DIAGNOSIS — Z6832 Body mass index (BMI) 32.0-32.9, adult: Secondary | ICD-10-CM | POA: Diagnosis not present

## 2022-02-01 DIAGNOSIS — I422 Other hypertrophic cardiomyopathy: Secondary | ICD-10-CM | POA: Diagnosis not present

## 2022-02-01 DIAGNOSIS — Z8249 Family history of ischemic heart disease and other diseases of the circulatory system: Secondary | ICD-10-CM | POA: Diagnosis not present

## 2022-02-01 DIAGNOSIS — Z79899 Other long term (current) drug therapy: Secondary | ICD-10-CM | POA: Diagnosis not present

## 2022-02-01 DIAGNOSIS — I5032 Chronic diastolic (congestive) heart failure: Secondary | ICD-10-CM | POA: Diagnosis not present

## 2022-02-01 DIAGNOSIS — I10 Essential (primary) hypertension: Secondary | ICD-10-CM

## 2022-02-01 DIAGNOSIS — I4819 Other persistent atrial fibrillation: Secondary | ICD-10-CM | POA: Diagnosis not present

## 2022-02-01 HISTORY — PX: CARDIOVERSION: SHX1299

## 2022-02-01 LAB — POCT I-STAT, CHEM 8
BUN: 16 mg/dL (ref 8–23)
Calcium, Ion: 1.33 mmol/L (ref 1.15–1.40)
Chloride: 112 mmol/L — ABNORMAL HIGH (ref 98–111)
Creatinine, Ser: 0.8 mg/dL (ref 0.44–1.00)
Glucose, Bld: 107 mg/dL — ABNORMAL HIGH (ref 70–99)
HCT: 44 % (ref 36.0–46.0)
Hemoglobin: 15 g/dL (ref 12.0–15.0)
Potassium: 4.3 mmol/L (ref 3.5–5.1)
Sodium: 142 mmol/L (ref 135–145)
TCO2: 21 mmol/L — ABNORMAL LOW (ref 22–32)

## 2022-02-01 SURGERY — CARDIOVERSION
Anesthesia: General

## 2022-02-01 MED ORDER — LIDOCAINE 2% (20 MG/ML) 5 ML SYRINGE
INTRAMUSCULAR | Status: DC | PRN
  Administered 2022-02-01: 100 mg via INTRAVENOUS

## 2022-02-01 MED ORDER — PROPOFOL 10 MG/ML IV BOLUS
INTRAVENOUS | Status: DC | PRN
  Administered 2022-02-01: 60 mg via INTRAVENOUS

## 2022-02-01 MED ORDER — SODIUM CHLORIDE 0.9 % IV SOLN: INTRAVENOUS | Status: DC

## 2022-02-01 NOTE — Anesthesia Postprocedure Evaluation (Signed)
Anesthesia Post Note  Patient: Christine Jacobson  Procedure(s) Performed: CARDIOVERSION     Patient location during evaluation: Endoscopy Anesthesia Type: General Level of consciousness: sedated Pain management: pain level controlled Vital Signs Assessment: post-procedure vital signs reviewed and stable Respiratory status: spontaneous breathing and respiratory function stable Cardiovascular status: stable Postop Assessment: no apparent nausea or vomiting Anesthetic complications: no  No notable events documented.  Last Vitals:  Vitals:   02/01/22 1250 02/01/22 1300  BP: (!) 144/79 (!) 145/93  Pulse: 63 61  Resp: 19 16  Temp:    SpO2: 92% 95%    Last Pain:  Vitals:   02/01/22 1300  TempSrc:   PainSc: 0-No pain                 Ladasia Sircy DANIEL

## 2022-02-01 NOTE — CV Procedure (Signed)
   Electrical Cardioversion Procedure Note Christine Jacobson [id number] Jun 04, 1955  Procedure: Electrical Cardioversion Indications:  Atrial Fibrillation  Time Out: Verified patient identification, verified procedure,medications/allergies/relevent history reviewed, required imaging and test results available.  Performed  Procedure Details  The patient signed informed consent.   The patient was NPO past midnight. Has had therapeutic anticoagulation with Eliquis greater than 3 weeks. The patient denies any interruption of anticoagulation.  Anesthesia was administered by Dr. Tobias Alexander.  Adequate airway was maintained throughout and vital followed per protocol.  He was cardioverted x 1 with 200 J of biphasic synchronized energy.  He converted to NSR.  There were no apparent complications.  The patient tolerated the procedure well and had normal neuro status and respiratory status post procedure with vitals stable as recorded elsewhere.     IMPRESSION:  Successful cardioversion of atrial fibrillation to normal sinus rhythm.   Follow up:  We will arrange follow up with primary cardiologist.  He will continue on current medical therapy.  The patient advised to continue anticoagulation.  Christine Jacobson 02/01/2022, 12:28 PM

## 2022-02-01 NOTE — Transfer of Care (Signed)
Immediate Anesthesia Transfer of Care Note  Patient: Christine Jacobson  Procedure(s) Performed: CARDIOVERSION  Patient Location: PACU and Endoscopy Unit  Anesthesia Type:General  Level of Consciousness: drowsy and patient cooperative  Airway & Oxygen Therapy: Patient Spontanous Breathing  Post-op Assessment: Report given to RN and Post -op Vital signs reviewed and stable  Post vital signs: Reviewed and stable  Last Vitals:  Vitals Value Taken Time  BP 126/81   Temp    Pulse 61   Resp 18   SpO2 94     Last Pain:  Vitals:   02/01/22 1050  TempSrc: Tympanic  PainSc: 0-No pain         Complications: No notable events documented.

## 2022-02-01 NOTE — H&P (Signed)
Cardiology Admission History and Physical   Patient ID: Christine Jacobson MRN: [id number]; DOB: Jan 01, 1956   Admission date: 02/01/2022  PCP:  Ceasar Lund, PA   [facility] Providers Cardiologist:  Thomasene Ripple, DO        Chief Complaint:    Patient Profile:   Christine Jacobson is a 67 y.o. female with atrial fibrillation  who is being seen 02/01/2022 for here for cardioversion.  History of Present Illness:   [name] 67 year old female  hx of obesity, hypertrophic cardiomyopathy with intracavity gradient of 64 mmHg which increased with Valsalva, chronic diastolic heart failure grade 2 diastolic dysfunction, paroxysmal atrial fibrillation, hypertension, hypercholesteremia.  Here for cardioversion.-No complaints   Past Medical History:  Diagnosis Date   Chest pain 11/22/2010   2D STRESS ECHO - EF 60%, peak stress EF 80%, normal, no evidence for stress-induced ischemia   HOCM (hypertrophic obstructive cardiomyopathy) (HCC) 06/21/2011   2D ECHO - EF >55%, normal   HTN (hypertension)    Hyperprolactinemia (HCC)    dx in her 18, took meds, self d/c a while back   Liver function test abnormality    normal when repeated   Mild hyperlipidemia    Obesity    Palpitations    negative stress echo in November 2012 with normal LV function; mild LVH, proximal septal thickening with narrow LVOT and mild gradient; mild MR and TR; Cardionet showed PACs in November 2012   Pyoderma gangrenosa    Shortness of breath 07/11/2011   MET TEST    Past Surgical History:  Procedure Laterality Date   RIGHT HEART CATH N/A 10/15/2021   Procedure: RIGHT HEART CATH;  Surgeon: Elder Negus, MD;  Location: [facility] INVASIVE CV LAB;  Service: Cardiovascular;  Laterality: N/A;   SKIN GRAFT  2006   porcine, R leg     Medications Prior to Admission: Prior to Admission medications   Medication Sig Start Date End Date Taking? Authorizing Provider  apixaban (ELIQUIS) 5 MG TABS tablet Take 1  tablet (5 mg total) by mouth 2 (two) times daily. 03/30/21  Yes Monge, Petra Kuba, NP  diltiazem (CARDIZEM CD) 180 MG 24 hr capsule Take 180 mg by mouth in the morning and at bedtime.   Yes [provider]  ibuprofen (ADVIL) 200 MG tablet Take 400 mg by mouth every 8 (eight) hours as needed for moderate pain.   Yes [provider]  metoprolol succinate (TOPROL-XL) 100 MG 24 hr tablet TAKE ONE TABLET BY MOUTH TWICE A DAY WITH OR IMMEDIATELY FOLLOWING A MEAL Patient taking differently: Take 100 mg by mouth in the morning, at noon, and at bedtime. 05/25/21  Yes [name], [name], MD  potassium chloride SA (KLOR-CON M) 20 MEQ tablet Take 20 mEq by mouth daily.   Yes [provider]  sertraline (ZOLOFT) 50 MG tablet Take 1 tablet (50 mg total) by mouth at bedtime. 10/29/21  Yes Yates Decamp, MD  torsemide (DEMADEX) 20 MG tablet TAKE 2 TABLETS BY MOUTH DAILY 12/24/21  Yes Yates Decamp, MD     Allergies:   No Known Allergies  Social History:   Social History   Socioeconomic History   Marital status: Single    Spouse name: Not on file   Number of children: 0   Years of education: Not on file   Highest education level: Not on file  Occupational History   Occupation: para Psychologist, forensic, part time cook  Tobacco Use   Smoking status:  Former    Packs/day: 1.00    Years: 4.00    Total pack years: 4.00    Types: Cigarettes   Smokeless tobacco: Never  Vaping Use   Vaping Use: Never used  Substance and Sexual Activity   Alcohol use: Not Currently    Alcohol/week: 1.0 standard drink of alcohol    Types: 1 Glasses of wine per week    Comment: "I do have my wine" every day, has cut down from 2 bottles at a time, currently 2-3 glasses   Drug use: No   Sexual activity: Not Currently  Other Topics Concern   Not on file  Social History Narrative   Lives by self   Social Determinants of Health   Financial Resource Strain: Low Risk  (12/03/2021)   Overall Financial Resource  Strain (CARDIA)    Difficulty of Paying Living Expenses: Not very hard  Food Insecurity: No Food Insecurity (12/03/2021)   Hunger Vital Sign    Worried About Running Out of Food in the Last Year: Never true    Ran Out of Food in the Last Year: Never true  Transportation Needs: Unmet Transportation Needs (12/03/2021)   PRAPARE - Hydrologist (Medical): Yes    Lack of Transportation (Non-Medical): Yes  Physical Activity: Not on file  Stress: Not on file  Social Connections: Not on file  Intimate Partner Violence: Not on file    Family History:   The patient's family history includes Coronary artery disease in an other family member; Diabetes in an other family member; Emphysema in her father; Heart disease in her mother; Ovarian cancer in her mother. There is no history of Cancer.    ROS:  Please see the history of present illness.  Shortness of breath and all other ROS reviewed and negative.     Physical Exam/Data:  There were no vitals filed for this visit. No intake or output data in the 24 hours ending 02/01/22 1041    12/02/2021    8:27 AM 11/23/2021    1:41 PM 10/29/2021    9:23 AM  Last 3 Weights  Weight (lbs) 205 lb 205 lb 205 lb 12.8 oz  Weight (kg) 92.987 kg 92.987 kg 93.35 kg     There is no height or weight on file to calculate BMI.  General:  Well nourished, well developed, in no acute distress HEENT: normal Neck: no JVD Vascular: No carotid bruits; Distal pulses 2+ bilaterally   Cardiac:  normal S1, S2; RRR; no murmur  Lungs:  clear to auscultation bilaterally, no wheezing, rhonchi or rales  Abd: soft, nontender, no hepatomegaly  Ext: no edema Musculoskeletal:  No deformities, BUE and BLE strength normal and equal Skin: warm and dry  Neuro:  CNs 2-12 intact, no focal abnormalities noted Psych:  Normal affect    EKG: Atrial fibrillation  Relevant CV Studies: PCV ECHOCARDIOGRAM COMPLETE 09/02/2021  Narrative Echocardiogram  08/17/2021: 1. Normal LV systolic function with visual EF 60-65%. Left ventricle cavity is normal in size. Hypertrophic cardiomyopathy. Severe concentric hypertrophy of the left ventricle. Normal global wall motion. Ungraded restrictive diastolic dysfunction, elevated LAP. 2. Left atrial cavity is severely dilated at 5.4 cm. 3. Right atrial cavity is moderately dilated. 4. Right ventricle cavity is mild to moderately dilated. Normal right ventricular function. 5. Mild (Grade I) mitral regurgitation. Systolic anterior motion of MV noted. Moderate mitral valve posterior leaflet calcification. E-wave dominant mitral inflow. 6. Structurally normal tricuspid valve. Moderate tricuspid regurgitation.  Moderate pulmonary hypertension. RVSP measures 45 mmHg. 7. IVC is dilated with respiratory variation. 9. Recommend TEE.  Compared to the study done on 07/29/2017 report, RV dilatation is new.  Otherwise no significant change.  Laboratory Data:  High Sensitivity Troponin:  No results for input(s): "TROPONINIHS" in the last 720 hours.    ChemistryNo results for input(s): "NA", "K", "CL", "CO2", "GLUCOSE", "BUN", "CREATININE", "CALCIUM", "MG", "GFRNONAA", "GFRAA", "ANIONGAP" in the last 168 hours.  No results for input(s): "PROT", "ALBUMIN", "AST", "ALT", "ALKPHOS", "BILITOT" in the last 168 hours. Lipids No results for input(s): "CHOL", "TRIG", "HDL", "LABVLDL", "LDLCALC", "CHOLHDL" in the last 168 hours. HematologyNo results for input(s): "WBC", "RBC", "HGB", "HCT", "MCV", "MCH", "MCHC", "RDW", "PLT" in the last 168 hours. Thyroid No results for input(s): "TSH", "FREET4" in the last 168 hours. BNPNo results for input(s): "BNP", "PROBNP" in the last 168 hours.  DDimer No results for input(s): "DDIMER" in the last 168 hours.   Radiology/Studies:  No results found.   Assessment and Plan:   Atrial fibrillation-here for DC cardioversion. Shared Decision Making/Informed Consent The risks (stroke, cardiac  arrhythmias rarely resulting in the need for a temporary or permanent pacemaker, skin irritation or burns and complications associated with conscious sedation including aspiration, arrhythmia, respiratory failure and death), benefits (restoration of normal sinus rhythm) and alternatives of a direct current cardioversion were explained in detail to Ms. Mandeville and she agrees to proceed.    Plan to perform cardioversion and discharged home.   CHA2DS2-VASc Score = 4   This indicates a 4.8% annual risk of stroke. The patient's score is based upon: CHF History: 1 HTN History: 1 Diabetes History: 0 Stroke History: 0 Vascular Disease History: 0 Age Score: 1 Gender Score: 1     For questions or updates, please contact [place] Please consult [email or web address] for contact info under     Signed, Berniece Salines, DO  02/01/2022 10:41 AM

## 2022-02-01 NOTE — Anesthesia Preprocedure Evaluation (Addendum)
Anesthesia Evaluation  Patient identified by MRN, date of birth, ID band Patient awake    Reviewed: Allergy & Precautions, NPO status , Patient's Chart, lab work & pertinent test results  History of Anesthesia Complications Negative for: history of anesthetic complications  Airway Mallampati: III  TM Distance: >3 FB Neck ROM: Full    Dental no notable dental hx. (+) Dental Advisory Given   Pulmonary former smoker   Pulmonary exam normal        Cardiovascular hypertension, Pt. on medications and Pt. on home beta blockers Normal cardiovascular exam+ dysrhythmias Atrial Fibrillation   Echocardiogram 08/17/2021: 1. Normal LV systolic function with visual EF 60-65%. Left ventricle cavity is normal in size. Hypertrophic cardiomyopathy. Severe concentric hypertrophy of the left ventricle. Normal global wall motion. Ungraded restrictive diastolic dysfunction, elevated LAP. 2. Left atrial cavity is severely dilated at 5.4 cm. 3. Right atrial cavity is moderately dilated. 4. Right ventricle cavity is mild to moderately dilated. Normal right ventricular function. 5. Mild (Grade I) mitral regurgitation. Systolic anterior motion of MV noted. Moderate mitral valve posterior leaflet calcification. E-wave dominant mitral inflow. 6. Structurally normal tricuspid valve. Moderate tricuspid regurgitation. Moderate pulmonary hypertension. RVSP measures 45 mmHg. 7. IVC is dilated with respiratory variation. 9. Recommend TEE.     Neuro/Psych negative neurological ROS  negative psych ROS   GI/Hepatic negative GI ROS, Neg liver ROS,,,  Endo/Other  negative endocrine ROS    Renal/GU negative Renal ROS  negative genitourinary   Musculoskeletal negative musculoskeletal ROS (+)    Abdominal   Peds  Hematology negative hematology ROS (+)   Anesthesia Other Findings   Reproductive/Obstetrics                              Anesthesia Physical Anesthesia Plan  ASA: 3  Anesthesia Plan: General   Post-op Pain Management:    Induction: Intravenous  PONV Risk Score and Plan: 3 and Propofol infusion and Treatment may vary due to age or medical condition  Airway Management Planned: Mask  Additional Equipment: None  Intra-op Plan:   Post-operative Plan:   Informed Consent: I have reviewed the patients History and Physical, chart, labs and discussed the procedure including the risks, benefits and alternatives for the proposed anesthesia with the patient or authorized representative who has indicated his/her understanding and acceptance.     Dental advisory given  Plan Discussed with: CRNA, Anesthesiologist and Surgeon  Anesthesia Plan Comments:        Anesthesia Quick Evaluation

## 2022-02-02 ENCOUNTER — Other Ambulatory Visit: Payer: Self-pay | Admitting: Cardiology

## 2022-02-02 ENCOUNTER — Encounter (HOSPITAL_COMMUNITY): Payer: Self-pay | Admitting: Cardiology

## 2022-02-02 ENCOUNTER — Other Ambulatory Visit: Payer: Self-pay | Admitting: Nurse Practitioner

## 2022-02-03 NOTE — Telephone Encounter (Signed)
Prescription refill request for Eliquis received. Indication: AF Last office visit: 12/02/21  K Tobb MD Scr: 0.80 on 02/01/22 Age: 67 Weight: 93kg  Based on above findings Eliquis 5mg  twice daily is the appropriate dose.  Refill approved.

## 2022-02-04 ENCOUNTER — Ambulatory Visit (HOSPITAL_BASED_OUTPATIENT_CLINIC_OR_DEPARTMENT_OTHER): Payer: Medicare Other | Admitting: General Surgery

## 2022-02-07 ENCOUNTER — Ambulatory Visit (HOSPITAL_BASED_OUTPATIENT_CLINIC_OR_DEPARTMENT_OTHER): Payer: Medicare Other | Admitting: General Surgery

## 2022-02-10 ENCOUNTER — Ambulatory Visit: Payer: Medicare Other | Attending: Cardiology | Admitting: Cardiology

## 2022-02-10 ENCOUNTER — Encounter: Payer: Self-pay | Admitting: Cardiology

## 2022-02-10 VITALS — BP 140/82 | HR 63 | Ht 64.0 in | Wt 219.4 lb

## 2022-02-10 DIAGNOSIS — R0609 Other forms of dyspnea: Secondary | ICD-10-CM | POA: Diagnosis not present

## 2022-02-10 DIAGNOSIS — Z79899 Other long term (current) drug therapy: Secondary | ICD-10-CM

## 2022-02-10 DIAGNOSIS — I10 Essential (primary) hypertension: Secondary | ICD-10-CM | POA: Diagnosis not present

## 2022-02-10 DIAGNOSIS — I4819 Other persistent atrial fibrillation: Secondary | ICD-10-CM

## 2022-02-10 DIAGNOSIS — I5032 Chronic diastolic (congestive) heart failure: Secondary | ICD-10-CM

## 2022-02-10 DIAGNOSIS — I421 Obstructive hypertrophic cardiomyopathy: Secondary | ICD-10-CM

## 2022-02-10 MED ORDER — TORSEMIDE 20 MG PO TABS: ORAL_TABLET | ORAL | 3 refills | Status: DC

## 2022-02-10 NOTE — Patient Instructions (Addendum)
Medication Instructions:  Your physician has recommended you make the following change in your medication:  START: Torsemide 40 mg twice daily for 7 das than once daily.   Please take your blood pressure daily for 2 weeks and send in a MyChart message. Please include heart rates.   HOW TO TAKE YOUR BLOOD PRESSURE: Rest 5 minutes before taking your blood pressure. Don't smoke or drink caffeinated beverages for at least 30 minutes before. Take your blood pressure before (not after) you eat. Sit comfortably with your back supported and both feet on the floor (don't cross your legs). Elevate your arm to heart level on a table or a desk. Use the proper sized cuff. It should fit smoothly and snugly around your bare upper arm. There should be enough room to slip a fingertip under the cuff. The bottom edge of the cuff should be 1 inch above the crease of the elbow. Ideally, take 3 measurements at one sitting and record the average.  *If you need a refill on your cardiac medications before your next appointment, please call your pharmacy*   Lab Work: Your physician recommends that you have labs drawn today: BMET, Mag If you have labs (blood work) drawn today and your tests are completely normal, you will receive your results only by: MyChart Message (if you have MyChart) OR A paper copy in the mail If you have any lab test that is abnormal or we need to change your treatment, we will call you to review the results.   Testing/Procedures: None   Follow-Up: At [facility], you and your health needs are our priority.  As part of our continuing mission to provide you with exceptional heart care, we have created designated Provider Care Teams.  These Care Teams include your primary Cardiologist (physician) and Advanced Practice Providers (APPs -  Physician Assistants and Nurse Practitioners) who all work together to provide you with the care you need, when you need it.  We recommend  signing up for the patient portal called "MyChart".  Sign up information is provided on this After Visit Summary.  MyChart is used to connect with patients for Virtual Visits (Telemedicine).  Patients are able to view lab/test results, encounter notes, upcoming appointments, etc.  Non-urgent messages can be sent to your provider as well.   To learn more about what you can do with MyChart, go to [email or web address].    Your next appointment:   2 week(s)  Provider:   Berniece Salines, DO

## 2022-02-10 NOTE — Progress Notes (Signed)
Cardiology Office Note:    Date:  02/10/2022   ID:  [name], DOB 06-04-1955, MRN [id number]  PCP:  Ceasar Lund, PA  Cardiologist:  Thomasene Ripple, DO  Electrophysiologist:  None   Referring MD: Ceasar Lund, PA   " I am really short of breath"  History of Present Illness:    Christine Jacobson is a 67 y.o. female with a hx of  obesity, hypertrophic cardiomyopathy with intracavity gradient of 64 mmHg which increased with Valsalva, chronic diastolic heart failure grade 2 diastolic dysfunction, paroxysmal atrial fibrillation, hypertension, hypercholesteremia.  I saw the patient on December 02, 2021 for the first time after she requested to transfer to our practice. the patient previously followed with [facility] cardiology and was last seen by Dr. Yates Decamp January on October 13, 2021 at that time per his note they had multiple times discuss the fact that she is continue to have leg swelling.  He even the visit prior has sent her to the emergency department on 923 where she was discharged home after ED evaluation.  Per his note the patient fell in the office while trying to walk to check out this was called and she was directly taken to the emergency department.   It appears that some of the lab work that had been requested the patient had not follow-up to get these.  In addition there is a sleep apnea testing that was recommended as part of her A-fib work-up the patient has not been able to get any of these done.  During that visit she was in atrial fibrillation we talked about moving forward with trying to attempt to get the patient in sinus rhythm.  She was agreeable to that.  Since that time she had been nervous but was able to complete her cardioversion.  She is here today with her friend [name].  She tells me that she has not been taking her torsemide as prescribed because she is using the bathroom a lot.  But wants to feel better.  We had a opportunity to talk about what she sees as in  her goals as well as what her personal barriers are and what the plan should ultimately be for her health care.  We identified that she has had some areas in terms of not wanting to take her medicine because she goes back to the bathroom a lot.  But we both talked about this and I shared with her that she cannot reach the goal of taking away the fluid if feeling better if she does not take the torsemide.  She plans to take the torsemide as prescribed.    Past Medical History:  Diagnosis Date   Chest pain 11/22/2010   2D STRESS ECHO - EF 60%, peak stress EF 80%, normal, no evidence for stress-induced ischemia   HOCM (hypertrophic obstructive cardiomyopathy) (HCC) 06/21/2011   2D ECHO - EF >55%, normal   HTN (hypertension)    Hyperprolactinemia (HCC)    dx in her 18, took meds, self d/c a while back   Liver function test abnormality    normal when repeated   Mild hyperlipidemia    Obesity    Palpitations    negative stress echo in November 2012 with normal LV function; mild LVH, proximal septal thickening with narrow LVOT and mild gradient; mild MR and TR; Cardionet showed PACs in November 2012   Pyoderma gangrenosa    Shortness of breath 07/11/2011   MET TEST    Past  Surgical History:  Procedure Laterality Date   CARDIOVERSION N/A 02/01/2022   Procedure: CARDIOVERSION;  Surgeon: Berniece Salines, DO;  Location: [place];  Service: Cardiovascular;  Laterality: N/A;   RIGHT HEART CATH N/A 10/15/2021   Procedure: RIGHT HEART CATH;  Surgeon: Nigel Mormon, MD;  Location: [place] [facility];  Service: Cardiovascular;  Laterality: N/A;   SKIN GRAFT  2006   porcine, R leg    Current Medications: Current Meds  Medication Sig   diltiazem (CARDIZEM CD) 180 MG 24 hr capsule TAKE ONE CAPSULE BY MOUTH EVERY MORNING AND TAKE ONE CAPSULE BY MOUTH EVERY NIGHT AT BEDTIME   ELIQUIS 5 MG TABS tablet TAKE ONE TABLET BY MOUTH TWICE A DAY   ibuprofen (ADVIL) 200 MG tablet Take 400 mg by mouth every 8  (eight) hours as needed for moderate pain.   JARDIANCE 10 MG TABS tablet Take 10 mg by mouth daily.   metoprolol succinate (TOPROL-XL) 100 MG 24 hr tablet TAKE ONE TABLET BY MOUTH TWICE A DAY WITH OR IMMEDIATELY FOLLOWING A MEAL (Patient taking differently: Take 100 mg by mouth in the morning, at noon, and at bedtime.)   potassium chloride SA (KLOR-CON M) 20 MEQ tablet Take 20 mEq by mouth daily.   sertraline (ZOLOFT) 50 MG tablet Take 1 tablet (50 mg total) by mouth at bedtime.   torsemide (DEMADEX) 20 MG tablet Take 40 mg (2 tablets) twice daily for 7 days then 40 mg (2 tablets) once daily.   [DISCONTINUED] torsemide (DEMADEX) 20 MG tablet TAKE 2 TABLETS BY MOUTH DAILY     Allergies:   Patient has no known allergies.   Social History   Socioeconomic History   Marital status: Single    Spouse name: Not on file   Number of children: 0   Years of education: Not on file   Highest education level: Not on file  Occupational History   Occupation: para Statistician, part time cook  Tobacco Use   Smoking status: Former    Packs/day: 1.00    Years: 4.00    Total pack years: 4.00    Types: Cigarettes   Smokeless tobacco: Never  Vaping Use   Vaping Use: Never used  Substance and Sexual Activity   Alcohol use: Not Currently    Alcohol/week: 1.0 standard drink of alcohol    Types: 1 Glasses of wine per week    Comment: "I do have my wine" every day, has cut down from 2 bottles at a time, currently 2-3 glasses   Drug use: No   Sexual activity: Not Currently  Other Topics Concern   Not on file  Social History Narrative   Lives by self   Social Determinants of Health   Financial Resource Strain: Low Risk  (12/03/2021)   Overall Financial Resource Strain (CARDIA)    Difficulty of Paying Living Expenses: Not very hard  Food Insecurity: No Food Insecurity (12/03/2021)   Hunger Vital Sign    Worried About Running Out of Food in the Last Year: Never true    Ran Out of Food in the  Last Year: Never true  Transportation Needs: Unmet Transportation Needs (12/03/2021)   PRAPARE - Hydrologist (Medical): Yes    Lack of Transportation (Non-Medical): Yes  Physical Activity: Not on file  Stress: Not on file  Social Connections: Not on file     Family History: The patient's family history includes Coronary artery disease in an other family  member; Diabetes in an other family member; Emphysema in her father; Heart disease in her mother; Ovarian cancer in her mother. There is no history of Cancer.  ROS:   Review of Systems  Constitution: Negative for decreased appetite, fever and weight gain.  HENT: Negative for congestion, ear discharge, hoarse voice and sore throat.   Eyes: Negative for discharge, redness, vision loss in right eye and visual halos.  Cardiovascular: Negative for chest pain, dyspnea on exertion, leg swelling, orthopnea and palpitations.  Respiratory: Negative for cough, hemoptysis, shortness of breath and snoring.   Endocrine: Negative for heat intolerance and polyphagia.  Hematologic/Lymphatic: Negative for bleeding problem. Does not bruise/bleed easily.  Skin: Negative for flushing, nail changes, rash and suspicious lesions.  Musculoskeletal: Negative for arthritis, joint pain, muscle cramps, myalgias, neck pain and stiffness.  Gastrointestinal: Negative for abdominal pain, bowel incontinence, diarrhea and excessive appetite.  Genitourinary: Negative for decreased libido, genital sores and incomplete emptying.  Neurological: Negative for brief paralysis, focal weakness, headaches and loss of balance.  Psychiatric/Behavioral: Negative for altered mental status, depression and suicidal ideas.  Allergic/Immunologic: Negative for HIV exposure and persistent infections.    EKGs/Labs/Other Studies Reviewed:    The following studies were reviewed today:   EKG:  The ekg ordered today demonstrates sinus rhythm, heart rate 63 beats  minute.  Recent Labs: 11/23/2021: B Natriuretic Peptide 691.2 12/02/2021: ALT 62; Magnesium 2.0 01/04/2022: Platelets 192 02/01/2022: BUN 16; Creatinine, Ser 0.80; Hemoglobin 15.0; Potassium 4.3; Sodium 142  Recent Lipid Panel    Component Value Date/Time   CHOL 232 (H) 10/14/2021 0042   TRIG 191 (H) 10/14/2021 0042   HDL 52 10/14/2021 0042   CHOLHDL 4.5 10/14/2021 0042   VLDL 38 10/14/2021 0042   LDLCALC 142 (H) 10/14/2021 0042   LDLDIRECT 121.8 05/26/2011 0928    Physical Exam:    VS:  BP (!) 140/82   Pulse 63   Ht 5\' 4"  (1.626 m)   Wt 99.5 kg   SpO2 98%   BMI 37.66 kg/m     Wt Readings from Last 3 Encounters:  02/10/22 99.5 kg  02/01/22 86.2 kg  12/02/21 93 kg     GEN: Well nourished, well developed in no acute distress HEENT: Normal NECK: No JVD; No carotid bruits LYMPHATICS: No lymphadenopathy CARDIAC: S1S2 noted,RRR, no murmurs, rubs, gallops RESPIRATORY:  Clear to auscultation without rales, wheezing or rhonchi  ABDOMEN: Soft, non-tender, non-distended, +bowel sounds, no guarding. EXTREMITIES: No edema, No cyanosis, no clubbing MUSCULOSKELETAL:  No deformity  SKIN: Warm and dry NEUROLOGIC:  Alert and oriented x 3, non-focal PSYCHIATRIC:  Normal affect, good insight  ASSESSMENT:    1. Paroxysmal atrial fibrillation (HCC)   2. DOE (dyspnea on exertion)   3. Medication management   4. Primary hypertension   5. HOCM (hypertrophic obstructive cardiomyopathy) ([place])   6. Chronic diastolic heart failure ([place])    PLAN:     She is agreeable to being more compliant with her diuretics.  She will take her torsemide 40 mg twice a day for the next 7 days and then back to once daily.  I am going to get blood work today to assess her kidney function.  Thankfully she remains in sinus rhythm we will continue her current regimen.  Her blood pressure is acceptable, once we are able to keep her on her torsemide I think this will improve as well.  I suspect she may  have some underlying pulmonary contribution to her shortness of breath  we will have her see pulmonary plan will hopefully be to get a PFT and basic evaluation.  The patient is in agreement with the above plan. The patient left the office in stable condition.  The patient will follow up in   Medication Adjustments/Labs and Tests Ordered: Current medicines are reviewed at length with the patient today.  Concerns regarding medicines are outlined above.  Orders Placed This Encounter  Procedures   Basic Metabolic Panel (BMET)   Magnesium   Ambulatory referral to Pulmonology   EKG 12-Lead   Meds ordered this encounter  Medications   torsemide (DEMADEX) 20 MG tablet    Sig: Take 40 mg (2 tablets) twice daily for 7 days then 40 mg (2 tablets) once daily.    Dispense:  180 tablet    Refill:  3    After initial fill please remove Take 40 mg (2 tablets) twice daily for 7 days then" Instructions should say "Take 40 mg (2 tablets) once daily."    Patient Instructions  Medication Instructions:  Your physician has recommended you make the following change in your medication:  START: Torsemide 40 mg twice daily for 7 das than once daily.   Please take your blood pressure daily for 2 weeks and send in a MyChart message. Please include heart rates.   HOW TO TAKE YOUR BLOOD PRESSURE: Rest 5 minutes before taking your blood pressure. Don't smoke or drink caffeinated beverages for at least 30 minutes before. Take your blood pressure before (not after) you eat. Sit comfortably with your back supported and both feet on the floor (don't cross your legs). Elevate your arm to heart level on a table or a desk. Use the proper sized cuff. It should fit smoothly and snugly around your bare upper arm. There should be enough room to slip a fingertip under the cuff. The bottom edge of the cuff should be 1 inch above the crease of the elbow. Ideally, take 3 measurements at one sitting and record the  average.  *If you need a refill on your cardiac medications before your next appointment, please call your pharmacy*   Lab Work: Your physician recommends that you have labs drawn today: BMET, Mag If you have labs (blood work) drawn today and your tests are completely normal, you will receive your results only by: MyChart Message (if you have MyChart) OR A paper copy in the mail If you have any lab test that is abnormal or we need to change your treatment, we will call you to review the results.   Testing/Procedures: None   Follow-Up: At [facility], you and your health needs are our priority.  As part of our continuing mission to provide you with exceptional heart care, we have created designated Provider Care Teams.  These Care Teams include your primary Cardiologist (physician) and Advanced Practice Providers (APPs -  Physician Assistants and Nurse Practitioners) who all work together to provide you with the care you need, when you need it.  We recommend signing up for the patient portal called "MyChart".  Sign up information is provided on this After Visit Summary.  MyChart is used to connect with patients for Virtual Visits (Telemedicine).  Patients are able to view lab/test results, encounter notes, upcoming appointments, etc.  Non-urgent messages can be sent to your provider as well.   To learn more about what you can do with MyChart, go to [email or web address].    Your next appointment:   2 week(s)  Provider:  Enzo Treu, DO        Adopting a Healthy Lifestyle.  Know what a healthy weight is for you (roughly BMI <25) and aim to maintain this   Aim for 7+ servings of fruits and vegetables daily   65-80+ fluid ounces of water or unsweet tea for healthy kidneys   Limit to max 1 drink of alcohol per day; avoid smoking/tobacco   Limit animal fats in diet for cholesterol and heart health - choose grass fed whenever available   Avoid highly processed foods,  and foods high in saturated/trans fats   Aim for low stress - take time to unwind and care for your mental health   Aim for 150 min of moderate intensity exercise weekly for heart health, and weights twice weekly for bone health   Aim for 7-9 hours of sleep daily   When it comes to diets, agreement about the perfect plan isnt easy to find, even among the experts. Experts at the [facility] [employer] developed an idea known as the Healthy Eating Plate. Just imagine a plate divided into logical, healthy portions.   The emphasis is on diet quality:   Load up on vegetables and fruits - one-half of your plate: Aim for color and variety, and remember that potatoes dont count.   Go for whole grains - one-quarter of your plate: Whole wheat, barley, wheat berries, quinoa, oats, brown rice, and foods made with them. If you want pasta, go with whole wheat pasta.   Protein power - one-quarter of your plate: Fish, chicken, beans, and nuts are all healthy, versatile protein sources. Limit red meat.   The diet, however, does go beyond the plate, offering a few other suggestions.   Use healthy plant oils, such as olive, canola, soy, corn, sunflower and peanut. Check the labels, and avoid partially hydrogenated oil, which have unhealthy trans fats.   If youre thirsty, drink water. Coffee and tea are good in moderation, but skip sugary drinks and limit milk and dairy products to one or two daily servings.   The type of carbohydrate in the diet is more important than the amount. Some sources of carbohydrates, such as vegetables, fruits, whole grains, and beans-are healthier than others.   Finally, stay active  Signed, Thomasene Ripple, DO  02/10/2022 3:32 PM    [facility]

## 2022-02-11 LAB — BASIC METABOLIC PANEL
BUN/Creatinine Ratio: 20 (ref 12–28)
BUN: 18 mg/dL (ref 8–27)
CO2: 23 mmol/L (ref 20–29)
Calcium: 10.3 mg/dL (ref 8.7–10.3)
Chloride: 106 mmol/L (ref 96–106)
Creatinine, Ser: 0.91 mg/dL (ref 0.57–1.00)
Glucose: 95 mg/dL (ref 70–99)
Potassium: 5.4 mmol/L — ABNORMAL HIGH (ref 3.5–5.2)
Sodium: 140 mmol/L (ref 134–144)
eGFR: 69 mL/min/{1.73_m2} (ref >59)

## 2022-02-11 LAB — MAGNESIUM: Magnesium: 2 mg/dL (ref 1.6–2.3)

## 2022-02-15 ENCOUNTER — Other Ambulatory Visit: Payer: Self-pay | Admitting: Cardiovascular Disease

## 2022-02-17 ENCOUNTER — Other Ambulatory Visit: Payer: Self-pay

## 2022-02-17 ENCOUNTER — Ambulatory Visit: Payer: Medicare HMO | Admitting: Psychologist

## 2022-02-17 DIAGNOSIS — Z79899 Other long term (current) drug therapy: Secondary | ICD-10-CM

## 2022-02-17 DIAGNOSIS — F331 Major depressive disorder, recurrent, moderate: Secondary | ICD-10-CM

## 2022-02-17 NOTE — Progress Notes (Signed)
[facility] Counselor/Therapist Progress Note  Patient ID: Christine Jacobson, MRN: [id number],    Date: 02/17/2022  Time Spent: 01:10 pm to 01:41 pm; total time: 31 minutes   This session was held via phone teletherapy due to the coronavirus risk at this time. The patient consented to phone teletherapy and was located at her home during this session. She is aware it is the responsibility of the patient to secure confidentiality on her end of the session. The provider was in a private home office for the duration of this session. Limits of confidentiality were discussed with the patient.   Treatment Type: Individual Therapy  Reported Symptoms: Depression related to isolation  Mental Status Exam: Appearance:  NA     Behavior: Appropriate  Motor: NA  Speech/Language:  Clear and Coherent  Affect: Appropriate  Mood: normal  Thought process: normal  Thought content:   WNL  Sensory/Perceptual disturbances:   WNL  Orientation: oriented to person, place, and time/date  Attention: Good  Concentration: Good  Memory: WNL  Fund of knowledge:  Good  Insight:   Fair  Judgment:  Good  Impulse Control: Good   Risk Assessment: Danger to Self:  No Self-injurious Behavior: No Danger to Others: No Duty to Warn:no Physical Aggression / Violence:No  Access to Firearms a concern: No  Gang Involvement:No   Subjective: Beginning the session, patient described herself as having experienced a difficult week due to isolation. She voiced that she is contemplating moving to a care facility. She processed thoughts and emotions. She asked to follow up. She denied suicidal and homicidal ideation.    Interventions:  Worked on developing a therapeutic relationship with the patient using active listening and reflective statements. Provided emotional support using empathy and validation. Reviewed the treatment plan. Used summary statements. Processed the isolation that patient experienced. Explored  different ways to consider getting social needs met. Assisted the patient in doing a decisional analysis related to living arrangements. Provided empathic statements. Assessed for suicidal and homicidal ideation.   Homework: Implement coping strategies. Reflect on housing options  Next Session: Review homework. Emotional support  Diagnosis: F33.1 major depressive affective disorder, recurrent, moderate  Plan:  Goals Work through the grieving process and face reality of own death Accept emotional support from others around them Live life to the fullest, event though time may be limited Become as knowledgeable about the medical condition  Reduce fear, anxiety about the health condition  Accept the illness Accept the role of psychological and behavioral factors  Stabilize anxiety level wile increasing ability to function Learn and implement coping skills that result in a reduction of anxiety  Alleviate depressive symptoms Recognize, accept, and cope with depressive feelings Develop healthy thinking patterns Develop healthy interpersonal relationships  Objectives target date for all objectives is 12/29/2022 Identify feelings associated with the illness Family members share with each other feelings Identify the losses or limitations that have been experienced Verbalize acceptance of the reality of the medical condition Commit to learning and implement a proactive approach to managing personal stresses Verbalize an understanding of the medical condition Work with therapist to develop a plan for coping with stress Learn and implement skills for managing stress Engage in social, productive activities that are possible Engage in faith based activities implement positive imagery Identify coping skills and sources of emotional support Patient's partner and family members verbalize their fears regarding severity of health condition Identify sources of emotional distress  Learning and  implement calming skills to reduce overall  anxiety Learn and implement problem solving strategies Identify and engage in pleasant activities Learning and implement personal and interpersonal skills to reduce anxiety and improve interpersonal relationships Learn to accept limitations in life and commit to tolerating, rather than avoiding, unpleasant emotions while accomplishing meaningful goals Identify major life conflicts from the past and present that form the basis for present anxiety Learn and implement behavioral strategies Verbalize an understanding and resolution of current interpersonal problems Learn and implement problem solving and decision making skills Learn and implement conflict resolution skills to resolve interpersonal problems Verbalize an understanding of healthy and unhealthy emotions verbalize insight into how past relationships may be influence current experiences with depression Use mindfulness and acceptance strategies and increase value based behavior  Increase hopeful statements about the future.   Interventions Teach about stress and ways to handle stress Assist the patient in developing a coping action plan for stressors Conduct skills based training for coping strategies Train problem focused skills Sort out what activities the individual can do Encourage patient to rely upon his/her spiritual faith Teach the patient to use guided imagery Probe and evaluate family's ability to provide emotional support Allow family to share their fears Assist the patient in identifying, sorting through, and verbalizing the various feelings generated by his/her medical condition Meet with family members  Ask patient list out limitations  Use stress inoculation training  Use Acceptance and Commitment Therapy to help client accept uncomfortable realities in order to accomplish value-consistent goals Reinforce the client's insight into the role of his/her past emotional pain and  present anxiety  Discuss examples demonstrating that unrealistic worry overestimates the probability of threats and underestimate patient's ability  Assist the patient in analyzing his or her worries Help patient understand that avoidance is reinforcing  Behavioral activation help the client explore the relationship, nature of the dispute,  Help the client develop new interpersonal skills and relationships Conduct Problem so living therapy Teach conflict resolution skills Use a process-experiential approach Conduct TLDP Conduct ACT  The patient and clinician reviewed the treatment plan on 01/27/2022. The patient agreed to the treatment plan.    Conception Chancy, PsyD

## 2022-02-23 ENCOUNTER — Other Ambulatory Visit: Payer: Self-pay

## 2022-02-23 ENCOUNTER — Other Ambulatory Visit: Payer: Self-pay | Admitting: Pharmacist

## 2022-02-23 MED ORDER — APIXABAN 5 MG PO TABS
5.0000 mg | ORAL_TABLET | Freq: Two times a day (BID) | ORAL | 5 refills | Status: DC
  Filled 2023-01-04: qty 60, 30d supply, fill #0

## 2022-02-23 MED ORDER — POTASSIUM CHLORIDE CRYS ER 20 MEQ PO TBCR: 20.0000 meq | EXTENDED_RELEASE_TABLET | Freq: Every day | ORAL | 3 refills | Status: DC

## 2022-02-23 MED ORDER — METOPROLOL SUCCINATE ER 100 MG PO TB24: ORAL_TABLET | ORAL | 3 refills | Status: DC

## 2022-03-02 ENCOUNTER — Ambulatory Visit: Payer: Medicare HMO | Admitting: Cardiology

## 2022-03-02 ENCOUNTER — Other Ambulatory Visit: Payer: Self-pay

## 2022-03-02 MED ORDER — METOPROLOL SUCCINATE ER 100 MG PO TB24
100.0000 mg | ORAL_TABLET | Freq: Two times a day (BID) | ORAL | 0 refills | Status: DC
  Filled 2022-08-24: qty 60, 30d supply, fill #0

## 2022-03-02 MED ORDER — POTASSIUM CHLORIDE CRYS ER 20 MEQ PO TBCR: 20.0000 meq | EXTENDED_RELEASE_TABLET | Freq: Every day | ORAL | 0 refills | Status: DC

## 2022-03-04 ENCOUNTER — Ambulatory Visit: Payer: Medicare HMO | Admitting: Psychologist

## 2022-03-07 ENCOUNTER — Institutional Professional Consult (permissible substitution): Payer: Medicare HMO | Admitting: Pulmonary Disease

## 2022-03-08 ENCOUNTER — Ambulatory Visit: Payer: Medicare Other | Admitting: Cardiology

## 2022-04-08 ENCOUNTER — Institutional Professional Consult (permissible substitution): Payer: Medicare HMO | Admitting: Pulmonary Disease

## 2022-04-11 ENCOUNTER — Ambulatory Visit: Payer: Medicare HMO | Admitting: Cardiology

## 2022-04-13 ENCOUNTER — Ambulatory Visit: Payer: Medicare HMO | Admitting: Cardiology

## 2022-04-28 NOTE — Telephone Encounter (Signed)
Patient is on Dr. Barnetta Hammersmith schedule

## 2022-05-13 ENCOUNTER — Ambulatory Visit: Payer: Medicare Other | Admitting: Behavioral Health

## 2022-05-18 ENCOUNTER — Ambulatory Visit: Payer: Medicare Other | Admitting: Behavioral Health

## 2022-06-02 ENCOUNTER — Other Ambulatory Visit: Payer: Self-pay

## 2022-06-02 ENCOUNTER — Inpatient Hospital Stay (HOSPITAL_COMMUNITY): Payer: Medicare Other

## 2022-06-02 ENCOUNTER — Inpatient Hospital Stay (HOSPITAL_COMMUNITY)
Admission: EM | Admit: 2022-06-02 | Discharge: 2022-06-09 | DRG: 871 | Disposition: A | Discharge status: Home Health | Payer: Medicare Other | Attending: Internal Medicine | Admitting: Internal Medicine

## 2022-06-02 ENCOUNTER — Encounter (HOSPITAL_COMMUNITY): Payer: Self-pay | Admitting: Emergency Medicine

## 2022-06-02 ENCOUNTER — Emergency Department (HOSPITAL_COMMUNITY): Payer: Medicare Other

## 2022-06-02 DIAGNOSIS — E8809 Other disorders of plasma-protein metabolism, not elsewhere classified: Secondary | ICD-10-CM | POA: Diagnosis not present

## 2022-06-02 DIAGNOSIS — Z91013 Allergy to seafood: Secondary | ICD-10-CM

## 2022-06-02 DIAGNOSIS — I110 Hypertensive heart disease with heart failure: Secondary | ICD-10-CM | POA: Diagnosis present

## 2022-06-02 DIAGNOSIS — E871 Hypo-osmolality and hyponatremia: Secondary | ICD-10-CM | POA: Diagnosis present

## 2022-06-02 DIAGNOSIS — I959 Hypotension, unspecified: Secondary | ICD-10-CM | POA: Diagnosis present

## 2022-06-02 DIAGNOSIS — E669 Obesity, unspecified: Secondary | ICD-10-CM | POA: Diagnosis present

## 2022-06-02 DIAGNOSIS — A419 Sepsis, unspecified organism: Principal | ICD-10-CM | POA: Diagnosis present

## 2022-06-02 DIAGNOSIS — I890 Lymphedema, not elsewhere classified: Secondary | ICD-10-CM | POA: Diagnosis present

## 2022-06-02 DIAGNOSIS — R011 Cardiac murmur, unspecified: Secondary | ICD-10-CM | POA: Diagnosis present

## 2022-06-02 DIAGNOSIS — Z87891 Personal history of nicotine dependence: Secondary | ICD-10-CM

## 2022-06-02 DIAGNOSIS — E782 Mixed hyperlipidemia: Secondary | ICD-10-CM | POA: Diagnosis present

## 2022-06-02 DIAGNOSIS — L03116 Cellulitis of left lower limb: Secondary | ICD-10-CM | POA: Diagnosis present

## 2022-06-02 DIAGNOSIS — I4819 Other persistent atrial fibrillation: Secondary | ICD-10-CM | POA: Diagnosis present

## 2022-06-02 DIAGNOSIS — E860 Dehydration: Secondary | ICD-10-CM | POA: Diagnosis present

## 2022-06-02 DIAGNOSIS — Z91018 Allergy to other foods: Secondary | ICD-10-CM

## 2022-06-02 DIAGNOSIS — R748 Abnormal levels of other serum enzymes: Secondary | ICD-10-CM | POA: Diagnosis present

## 2022-06-02 DIAGNOSIS — I5033 Acute on chronic diastolic (congestive) heart failure: Secondary | ICD-10-CM | POA: Diagnosis not present

## 2022-06-02 DIAGNOSIS — I5032 Chronic diastolic (congestive) heart failure: Secondary | ICD-10-CM | POA: Diagnosis present

## 2022-06-02 DIAGNOSIS — Z8041 Family history of malignant neoplasm of ovary: Secondary | ICD-10-CM

## 2022-06-02 DIAGNOSIS — Z6834 Body mass index (BMI) 34.0-34.9, adult: Secondary | ICD-10-CM

## 2022-06-02 DIAGNOSIS — Z7901 Long term (current) use of anticoagulants: Secondary | ICD-10-CM

## 2022-06-02 DIAGNOSIS — N179 Acute kidney failure, unspecified: Secondary | ICD-10-CM | POA: Diagnosis present

## 2022-06-02 DIAGNOSIS — Z66 Do not resuscitate: Secondary | ICD-10-CM | POA: Diagnosis present

## 2022-06-02 DIAGNOSIS — I421 Obstructive hypertrophic cardiomyopathy: Secondary | ICD-10-CM | POA: Diagnosis present

## 2022-06-02 DIAGNOSIS — E876 Hypokalemia: Secondary | ICD-10-CM | POA: Diagnosis not present

## 2022-06-02 DIAGNOSIS — L0390 Cellulitis, unspecified: Secondary | ICD-10-CM | POA: Diagnosis not present

## 2022-06-02 DIAGNOSIS — L299 Pruritus, unspecified: Secondary | ICD-10-CM | POA: Diagnosis present

## 2022-06-02 DIAGNOSIS — R6520 Severe sepsis without septic shock: Secondary | ICD-10-CM | POA: Diagnosis present

## 2022-06-02 DIAGNOSIS — E785 Hyperlipidemia, unspecified: Secondary | ICD-10-CM | POA: Diagnosis present

## 2022-06-02 DIAGNOSIS — Z91014 Allergy to mammalian meats: Secondary | ICD-10-CM

## 2022-06-02 DIAGNOSIS — E861 Hypovolemia: Secondary | ICD-10-CM | POA: Diagnosis present

## 2022-06-02 DIAGNOSIS — E872 Acidosis, unspecified: Secondary | ICD-10-CM | POA: Diagnosis not present

## 2022-06-02 DIAGNOSIS — Z8249 Family history of ischemic heart disease and other diseases of the circulatory system: Secondary | ICD-10-CM

## 2022-06-02 DIAGNOSIS — Z7984 Long term (current) use of oral hypoglycemic drugs: Secondary | ICD-10-CM

## 2022-06-02 DIAGNOSIS — Z833 Family history of diabetes mellitus: Secondary | ICD-10-CM

## 2022-06-02 DIAGNOSIS — Z79899 Other long term (current) drug therapy: Secondary | ICD-10-CM | POA: Diagnosis not present

## 2022-06-02 DIAGNOSIS — L03115 Cellulitis of right lower limb: Secondary | ICD-10-CM | POA: Diagnosis present

## 2022-06-02 DIAGNOSIS — I10 Essential (primary) hypertension: Secondary | ICD-10-CM | POA: Diagnosis present

## 2022-06-02 DIAGNOSIS — Z825 Family history of asthma and other chronic lower respiratory diseases: Secondary | ICD-10-CM

## 2022-06-02 DIAGNOSIS — I480 Paroxysmal atrial fibrillation: Secondary | ICD-10-CM | POA: Diagnosis present

## 2022-06-02 LAB — CBC WITH DIFFERENTIAL/PLATELET
Abs Immature Granulocytes: 0.19 10*3/uL — ABNORMAL HIGH (ref 0.00–0.07)
Basophils Absolute: 0.1 10*3/uL (ref 0.0–0.1)
Basophils Relative: 1 %
Eosinophils Absolute: 0.1 10*3/uL (ref 0.0–0.5)
Eosinophils Relative: 1 %
HCT: 38.7 % (ref 36.0–46.0)
Hemoglobin: 12.7 g/dL (ref 12.0–15.0)
Immature Granulocytes: 2 %
Lymphocytes Relative: 6 %
Lymphs Abs: 0.7 10*3/uL (ref 0.7–4.0)
MCH: 31.3 pg (ref 26.0–34.0)
MCHC: 32.8 g/dL (ref 30.0–36.0)
MCV: 95.3 fL (ref 80.0–100.0)
Monocytes Absolute: 0.8 10*3/uL (ref 0.1–1.0)
Monocytes Relative: 6 %
Neutro Abs: 10.4 10*3/uL — ABNORMAL HIGH (ref 1.7–7.7)
Neutrophils Relative %: 84 %
Platelets: 305 10*3/uL (ref 150–400)
RBC: 4.06 MIL/uL (ref 3.87–5.11)
RDW: 15.3 % (ref 11.5–15.5)
WBC: 12.3 10*3/uL — ABNORMAL HIGH (ref 4.0–10.5)
nRBC: 0 % (ref 0.0–0.2)

## 2022-06-02 LAB — BASIC METABOLIC PANEL
Anion gap: 12 (ref 5–15)
BUN: 53 mg/dL — ABNORMAL HIGH (ref 8–23)
CO2: 14 mmol/L — ABNORMAL LOW (ref 22–32)
Calcium: 9 mg/dL (ref 8.9–10.3)
Chloride: 103 mmol/L (ref 98–111)
Creatinine, Ser: 2.16 mg/dL — ABNORMAL HIGH (ref 0.44–1.00)
GFR, Estimated: 24 mL/min — ABNORMAL LOW (ref >60)
Glucose, Bld: 107 mg/dL — ABNORMAL HIGH (ref 70–99)
Potassium: 4.5 mmol/L (ref 3.5–5.1)
Sodium: 129 mmol/L — ABNORMAL LOW (ref 135–145)

## 2022-06-02 LAB — C-REACTIVE PROTEIN: CRP: 2.8 mg/dL — ABNORMAL HIGH (ref <1.0)

## 2022-06-02 LAB — OSMOLALITY: Osmolality: 295 mOsm/kg (ref 275–295)

## 2022-06-02 LAB — SEDIMENTATION RATE: Sed Rate: 80 mm/hr — ABNORMAL HIGH (ref 0–22)

## 2022-06-02 LAB — PREALBUMIN: Prealbumin: 22 mg/dL (ref 18–38)

## 2022-06-02 LAB — MRSA NEXT GEN BY PCR, NASAL: MRSA by PCR Next Gen: NOT DETECTED

## 2022-06-02 LAB — LACTIC ACID, PLASMA: Lactic Acid, Venous: 2.2 mmol/L (ref 0.5–1.9)

## 2022-06-02 MED ORDER — LEVALBUTEROL HCL 0.63 MG/3ML IN NEBU: 0.6300 mg | INHALATION_SOLUTION | Freq: Four times a day (QID) | RESPIRATORY_TRACT | Status: DC | PRN

## 2022-06-02 MED ORDER — DIPHENHYDRAMINE HCL 25 MG PO CAPS
25.0000 mg | ORAL_CAPSULE | Freq: Three times a day (TID) | ORAL | Status: DC | PRN
  Administered 2022-06-02: 25 mg via ORAL
  Filled 2022-06-02: qty 1

## 2022-06-02 MED ORDER — SORBITOL 70 % SOLN: 30.0000 mL | Freq: Every day | Status: DC | PRN

## 2022-06-02 MED ORDER — STERILE WATER FOR INJECTION IV SOLN
INTRAVENOUS | Status: DC
  Filled 2022-06-02: qty 150
  Filled 2022-06-02: qty 1000
  Filled 2022-06-02: qty 150

## 2022-06-02 MED ORDER — VANCOMYCIN VARIABLE DOSE PER UNSTABLE RENAL FUNCTION (PHARMACIST DOSING): Status: DC

## 2022-06-02 MED ORDER — HEPARIN SODIUM (PORCINE) 5000 UNIT/ML IJ SOLN: 5000.0000 [IU] | Freq: Three times a day (TID) | INTRAMUSCULAR | Status: DC

## 2022-06-02 MED ORDER — VANCOMYCIN HCL IN DEXTROSE 1-5 GM/200ML-% IV SOLN
1000.0000 mg | Freq: Once | INTRAVENOUS | Status: AC
  Administered 2022-06-02: 1000 mg via INTRAVENOUS
  Filled 2022-06-02: qty 200

## 2022-06-02 MED ORDER — HYDROMORPHONE HCL 1 MG/ML IJ SOLN
0.5000 mg | INTRAMUSCULAR | Status: DC | PRN
  Administered 2022-06-04 – 2022-06-06 (×4): 0.5 mg via INTRAVENOUS
  Filled 2022-06-02: qty 0.5
  Filled 2022-06-02: qty 1
  Filled 2022-06-02 (×2): qty 0.5

## 2022-06-02 MED ORDER — DIPHENHYDRAMINE HCL 25 MG PO CAPS: 25.0000 mg | ORAL_CAPSULE | Freq: Every evening | ORAL | Status: DC | PRN

## 2022-06-02 MED ORDER — OXYCODONE HCL 5 MG PO TABS
5.0000 mg | ORAL_TABLET | ORAL | Status: DC | PRN
  Administered 2022-06-02 – 2022-06-08 (×12): 5 mg via ORAL
  Filled 2022-06-02 (×12): qty 1

## 2022-06-02 MED ORDER — HYDRALAZINE HCL 20 MG/ML IJ SOLN: 10.0000 mg | Freq: Four times a day (QID) | INTRAMUSCULAR | Status: DC | PRN

## 2022-06-02 MED ORDER — SODIUM CHLORIDE 0.9 % IV BOLUS
500.0000 mL | Freq: Once | INTRAVENOUS | Status: AC
  Administered 2022-06-02: 500 mL via INTRAVENOUS

## 2022-06-02 MED ORDER — ACETAMINOPHEN 325 MG PO TABS
650.0000 mg | ORAL_TABLET | Freq: Four times a day (QID) | ORAL | Status: DC | PRN
  Administered 2022-06-02 – 2022-06-08 (×9): 650 mg via ORAL
  Filled 2022-06-02 (×8): qty 2

## 2022-06-02 MED ORDER — SODIUM CHLORIDE 0.9% FLUSH
3.0000 mL | Freq: Two times a day (BID) | INTRAVENOUS | Status: DC
  Administered 2022-06-02 – 2022-06-09 (×12): 3 mL via INTRAVENOUS

## 2022-06-02 MED ORDER — SERTRALINE HCL 50 MG PO TABS
50.0000 mg | ORAL_TABLET | Freq: Every day | ORAL | Status: DC
  Administered 2022-06-02 – 2022-06-08 (×7): 50 mg via ORAL
  Filled 2022-06-02 (×2): qty 1
  Filled 2022-06-02: qty 2
  Filled 2022-06-02 (×2): qty 1
  Filled 2022-06-02 (×2): qty 2
  Filled 2022-06-02: qty 1
  Filled 2022-06-02: qty 2
  Filled 2022-06-02: qty 1
  Filled 2022-06-02 (×2): qty 2
  Filled 2022-06-02: qty 1

## 2022-06-02 MED ORDER — METOPROLOL SUCCINATE ER 25 MG PO TB24
25.0000 mg | ORAL_TABLET | Freq: Every day | ORAL | Status: DC
  Administered 2022-06-03 – 2022-06-05 (×3): 25 mg via ORAL
  Filled 2022-06-02 (×3): qty 1

## 2022-06-02 MED ORDER — VANCOMYCIN HCL 1500 MG/300ML IV SOLN
1500.0000 mg | Freq: Once | INTRAVENOUS | Status: AC
  Administered 2022-06-02: 1500 mg via INTRAVENOUS
  Filled 2022-06-02: qty 300

## 2022-06-02 MED ORDER — OXYCODONE HCL 5 MG PO TABS
5.0000 mg | ORAL_TABLET | Freq: Once | ORAL | Status: AC
  Administered 2022-06-02: 5 mg via ORAL
  Filled 2022-06-02: qty 1

## 2022-06-02 MED ORDER — ONDANSETRON HCL 4 MG PO TABS
4.0000 mg | ORAL_TABLET | Freq: Four times a day (QID) | ORAL | Status: DC | PRN
  Administered 2022-06-09: 4 mg via ORAL
  Filled 2022-06-02 (×2): qty 1

## 2022-06-02 MED ORDER — PNEUMOCOCCAL 20-VAL CONJ VACC 0.5 ML IM SUSY
0.5000 mL | PREFILLED_SYRINGE | INTRAMUSCULAR | Status: DC
  Filled 2022-06-02 (×2): qty 0.5

## 2022-06-02 MED ORDER — HYDROCORTISONE 1 % EX CREA
1.0000 | TOPICAL_CREAM | Freq: Three times a day (TID) | CUTANEOUS | Status: DC | PRN
  Filled 2022-06-02: qty 28

## 2022-06-02 MED ORDER — ACETAMINOPHEN 650 MG RE SUPP: 650.0000 mg | Freq: Four times a day (QID) | RECTAL | Status: DC | PRN

## 2022-06-02 MED ORDER — ONDANSETRON HCL 4 MG/2ML IJ SOLN
4.0000 mg | Freq: Four times a day (QID) | INTRAMUSCULAR | Status: DC | PRN
  Administered 2022-06-05 – 2022-06-07 (×2): 4 mg via INTRAVENOUS
  Filled 2022-06-02 (×2): qty 2

## 2022-06-02 MED ORDER — APIXABAN 5 MG PO TABS
5.0000 mg | ORAL_TABLET | Freq: Two times a day (BID) | ORAL | Status: DC
  Administered 2022-06-02 – 2022-06-05 (×6): 5 mg via ORAL
  Filled 2022-06-02 (×6): qty 1

## 2022-06-02 MED ORDER — POLYETHYLENE GLYCOL 3350 17 G PO PACK
17.0000 g | PACK | Freq: Every day | ORAL | Status: DC | PRN
  Administered 2022-06-09: 17 g via ORAL
  Filled 2022-06-02: qty 1

## 2022-06-02 MED ORDER — SODIUM CHLORIDE 0.9 % IV SOLN
2.0000 g | INTRAVENOUS | Status: DC
  Administered 2022-06-02 – 2022-06-03 (×2): 2 g via INTRAVENOUS
  Filled 2022-06-02 (×3): qty 12.5

## 2022-06-02 NOTE — ED Notes (Signed)
ED TO INPATIENT HANDOFF REPORT  ED Nurse Name and Phone #: Maddisyn Hegwood [phone]  S Name/Age/Gender Christine Jacobson 67 y.o. female Room/Bed: 035C/035C  Code Status   Code Status: Prior  Home/SNF/Other Home Patient oriented to: self, place, time, and situation Is this baseline? Yes   Triage Complete: Triage complete  Chief Complaint Cellulitis [L03.90]  Triage Note Pt BIB [facility] from physicians office for cellulitis on legs.  Pt has taken her metoprolol today.  Hx CHF.  VS BP 90/60, HR 60, CBG 119.  Pt given NS en route.   Allergies No Known Allergies  Level of Care/Admitting Diagnosis ED Disposition     ED Disposition  Admit   Condition  --   Comment  Hospital Area: [facility] [[id number]]  Level of Care: Progressive [102]  Admit to Progressive based on following criteria: MULTISYSTEM THREATS such as stable sepsis, metabolic/electrolyte imbalance with or without encephalopathy that is responding to early treatment.  May admit patient to [facility] or [facility] if equivalent level of care is available:: No  Covid Evaluation: Asymptomatic - no recent exposure (last 10 days) testing not required  Diagnosis: Cellulitis [454098]  Admitting Physician: Rodolph Bong [[id number]]  Attending Physician: Rodolph Bong [[id number]]  Certification:: I certify this patient will need inpatient services for at least 2 midnights  Estimated Length of Stay: 3          B Medical/Surgery History Past Medical History:  Diagnosis Date   Chest pain 11/22/2010   2D STRESS ECHO - EF 60%, peak stress EF 80%, normal, no evidence for stress-induced ischemia   HOCM (hypertrophic obstructive cardiomyopathy) (HCC) 06/21/2011   2D ECHO - EF >55%, normal   HTN (hypertension)    Hyperprolactinemia (HCC)    dx in her 18, took meds, self d/c a while back   Liver function test abnormality    normal when repeated   Mild hyperlipidemia    Obesity    Palpitations    negative  stress echo in November 2012 with normal LV function; mild LVH, proximal septal thickening with narrow LVOT and mild gradient; mild MR and TR; Cardionet showed PACs in November 2012   Pyoderma gangrenosa    Shortness of breath 07/11/2011   MET TEST   Past Surgical History:  Procedure Laterality Date   CARDIOVERSION N/A 02/01/2022   Procedure: CARDIOVERSION;  Surgeon: Thomasene Ripple, DO;  Location: [facility] ENDOSCOPY;  Service: Cardiovascular;  Laterality: N/A;   RIGHT HEART CATH N/A 10/15/2021   Procedure: RIGHT HEART CATH;  Surgeon: Elder Negus, MD;  Location: [facility] INVASIVE CV LAB;  Service: Cardiovascular;  Laterality: N/A;   SKIN GRAFT  2006   porcine, R leg     A IV Location/Drains/Wounds Patient Lines/Drains/Airways Status     Active Line/Drains/Airways     Name Placement date Placement time Site Days   Peripheral IV 02/01/22 20 G Left Hand 02/01/22  1053  Hand  121   Peripheral IV 06/02/22 22 G Right Antecubital 06/02/22  1108  Antecubital  less than 1   Wound / Incision (Open or Dehisced) 10/13/21 Other (Comment) Pretibial Left;Posterior red, difuse, uniform rash-like appearance with purulence visible on the anterior lateral left leg, full thickness wound on left posterior calf 10/13/21  1950  Pretibial  232            Intake/Output Last 24 hours No intake or output data in the 24 hours ending 06/02/22 1636  Labs/Imaging Results for orders placed  or performed during the hospital encounter of 06/02/22 (from the past 48 hour(s))  Basic metabolic panel     Status: Abnormal   Collection Time: 06/02/22 11:23 AM  Result Value Ref Range   Sodium 129 (L) 135 - 145 mmol/L   Potassium 4.5 3.5 - 5.1 mmol/L   Chloride 103 98 - 111 mmol/L   CO2 14 (L) 22 - 32 mmol/L   Glucose, Bld 107 (H) 70 - 99 mg/dL    Comment: Glucose reference range applies only to samples taken after fasting for at least 8 hours.   BUN 53 (H) 8 - 23 mg/dL   Creatinine, Ser 3.08 (H) 0.44 - 1.00 mg/dL    Calcium 9.0 8.9 - 65.7 mg/dL   GFR, Estimated 24 (L) >60 mL/min    Comment: (NOTE) Calculated using the CKD-EPI Creatinine Equation (2021)    Anion gap 12 5 - 15    Comment: Performed at [facility], [address], [place], [place] [address]  CBC with Differential/Platelet     Status: Abnormal   Collection Time: 06/02/22 11:23 AM  Result Value Ref Range   WBC 12.3 (H) 4.0 - 10.5 K/uL   RBC 4.06 3.87 - 5.11 MIL/uL   Hemoglobin 12.7 12.0 - 15.0 g/dL   HCT 29.5 28.4 - 13.2 %   MCV 95.3 80.0 - 100.0 fL   MCH 31.3 26.0 - 34.0 pg   MCHC 32.8 30.0 - 36.0 g/dL   RDW 44.0 10.2 - 72.5 %   Platelets 305 150 - 400 K/uL   nRBC 0.0 0.0 - 0.2 %   Neutrophils Relative % 84 %   Neutro Abs 10.4 (H) 1.7 - 7.7 K/uL   Lymphocytes Relative 6 %   Lymphs Abs 0.7 0.7 - 4.0 K/uL   Monocytes Relative 6 %   Monocytes Absolute 0.8 0.1 - 1.0 K/uL   Eosinophils Relative 1 %   Eosinophils Absolute 0.1 0.0 - 0.5 K/uL   Basophils Relative 1 %   Basophils Absolute 0.1 0.0 - 0.1 K/uL   Immature Granulocytes 2 %   Abs Immature Granulocytes 0.19 (H) 0.00 - 0.07 K/uL    Comment: Performed at [facility], [address] [place], [place] [address]  Lactic acid, plasma     Status: Abnormal   Collection Time: 06/02/22 11:42 AM  Result Value Ref Range   Lactic Acid, Venous 2.2 (HH) 0.5 - 1.9 mmol/L    Comment: CRITICAL RESULT CALLED TO, READ BACK BY AND VERIFIED WITH R.GONZALES,RN 1230 06/02/22 CLARK,S Performed at [facility], [address], [place], [place] [address]    DG Tibia/Fibula Right  Result Date: 06/02/2022 CLINICAL DATA:  Lower extremity wound. Evaluate for disc space infection. EXAM: RIGHT TIBIA AND FIBULA - 2 VIEW COMPARISON:  None Available. FINDINGS: There is diffuse right calf subcutaneous fat edema and swelling. Mild navicular-cuneiform joint space narrowing. Tiny plantar calcaneal heel spur. Mild superior patellar degenerative osteophytosis. Tiny joint effusion of the knee.  Moderate lateral compartment of the knee joint space narrowing. The tibial and fibular cortices are intact. No acute fracture or dislocation. IMPRESSION: 1. Diffuse right calf subcutaneous fat edema and swelling. No radiographic evidence of osteomyelitis. 2. Mild-to-moderate lateral compartment of the knee osteoarthritis. Electronically Signed   By: Neita Garnet M.D.   On: 06/02/2022 12:52   DG Tibia/Fibula Left  Result Date: 06/02/2022 CLINICAL DATA:  Lower extremity wound. Evaluate for deep space infection. Cellulitis. EXAM: LEFT TIBIA AND FIBULA -  2 VIEW COMPARISON:  Left tibia and fibula radiographs 10/14/2021; CT left tibia and fibula 10/14/2021 FINDINGS: Moderate medial knee compartment and severe patellofemoral compartment joint space narrowing, subchondral sclerosis/cystic change, and peripheral osteophytosis. No joint effusion within the knee. Tiny plantar calcaneal heel spur. Moderate midfoot osteoarthritis. Moderate dorsal midfoot soft tissue swelling. Mild lucency overlying the dorsal navicular and cuneiform bones and lateral view presumably is secondary to overlying subcutaneous fat edema lucencies. The tibia and fibula cortices are intact. No acute fracture dislocation. IMPRESSION: 1. Moderate dorsal midfoot soft tissue swelling. Mild lucency overlying the dorsal navicular and cuneiform bones and lateral view presumably is secondary to overlying subcutaneous fat edema lucencies. 2. No CT evidence of osteomyelitis of the left tibia or fibula. 3. Moderate medial knee compartment and severe patellofemoral compartment osteoarthritis. Electronically Signed   By: Neita Garnet M.D.   On: 06/02/2022 12:50    Pending Labs Unresulted Labs (From admission, onward)     Start     Ordered   06/02/22 1142  Culture, blood (routine x 2)  BLOOD CULTURE X 2,   STAT        06/02/22 1142            Vitals/Pain Today's Vitals   06/02/22 1215 06/02/22 1439 06/02/22 1500 06/02/22 1530  BP: 112/74   98/69 119/77  Pulse: 72  82 79  Resp: 15  14 16   Temp:   97.9 F (36.6 C)   TempSrc:   Oral   SpO2: 100%  98% 98%  Weight:      Height:      PainSc:  2       Isolation Precautions No active isolations  Medications Medications  sodium bicarbonate 150 mEq in sterile water 1,150 mL infusion ( Intravenous New Bag/Given 06/02/22 1550)  sodium chloride 0.9 % bolus 500 mL (0 mLs Intravenous Stopped 06/02/22 1322)  sodium chloride 0.9 % bolus 500 mL (0 mLs Intravenous Stopped 06/02/22 1407)  vancomycin (VANCOCIN) IVPB 1000 mg/200 mL premix (0 mg Intravenous Stopped 06/02/22 1533)  oxyCODONE (Oxy IR/ROXICODONE) immediate release tablet 5 mg (5 mg Oral Given 06/02/22 1406)    Mobility walks     Focused Assessments    R Recommendations: See Admitting Provider Note  Report given to:   Additional Notes:

## 2022-06-02 NOTE — ED Notes (Signed)
Patient transported to X-ray 

## 2022-06-02 NOTE — ED Notes (Addendum)
Pt bilateral legs have edema.  Pt left lower leg is weeping with redness.

## 2022-06-02 NOTE — ED Provider Notes (Signed)
[place] EMERGENCY DEPARTMENT AT [facility] Provider Note   CSN: [id number] Arrival date & time: 06/02/22  1110     History  No chief complaint on file.   Christine Jacobson is a 67 y.o. female.  Patient is a 67 year old female with a past medical history of A-fib on Eliquis, HOCM, CHF presenting to the emergency department with lower extremity wounds.  Patient states that she has had wounds to her legs for several months.  She states that she initially saw wound care but has been self treating them at home with topical Neosporin.  She states that she saw her primary doctor today who is concerned that her wounds were looking worse and recommended that she come to the emergency department as she may need IV antibiotics.  She states that her primary doctor also told her that her blood pressure was low in the office.  Patient denies any fevers, nausea or vomiting but states that she has had a decreased appetite.  She states that her leg swelling appears somewhat improved to her.  She denies any purulent drainage.  The history is provided by the patient.       Home Medications Prior to Admission medications   Medication Sig Start Date End Date Taking? Authorizing Provider  apixaban (ELIQUIS) 5 MG TABS tablet Take 1 tablet (5 mg total) by mouth 2 (two) times daily. 02/23/22   Tobb, Kardie, DO  diltiazem (CARDIZEM CD) 180 MG 24 hr capsule TAKE ONE CAPSULE BY MOUTH EVERY MORNING AND TAKE ONE CAPSULE BY MOUTH EVERY NIGHT AT BEDTIME 02/03/22   Tolia, Sunit, DO  ibuprofen (ADVIL) 200 MG tablet Take 400 mg by mouth every 8 (eight) hours as needed for moderate pain.    [provider]  JARDIANCE 10 MG TABS tablet Take 10 mg by mouth daily. 01/27/22   [provider]  metoprolol succinate (TOPROL-XL) 100 MG 24 hr tablet TAKE ONE TABLET BY MOUTH TWICE A DAY WITH OR IMMEDIATELY FOLLOWING A MEAL 03/02/22   Tobb, Kardie, DO  potassium chloride SA (KLOR-CON M) 20 MEQ tablet Take 1  tablet (20 mEq total) by mouth daily. 03/02/22   Tobb, Kardie, DO  sertraline (ZOLOFT) 50 MG tablet Take 1 tablet (50 mg total) by mouth at bedtime. 10/29/21   Yates Decamp, MD  torsemide (DEMADEX) 20 MG tablet Take 40 mg (2 tablets) twice daily for 7 days then 40 mg (2 tablets) once daily. 02/10/22   Tobb, Lavona Mound, DO      Allergies    Patient has no known allergies.    Review of Systems   Review of Systems  Physical Exam Updated Vital Signs BP 112/74   Pulse 72   Temp 97.7 F (36.5 C) (Oral)   Resp 15   Ht 5\' 4"  (1.626 m)   Wt 78.5 kg   SpO2 100%   BMI 29.70 kg/m  Physical Exam Vitals and nursing note reviewed.  Constitutional:      General: She is not in acute distress.    Appearance: Normal appearance.  HENT:     Head: Normocephalic and atraumatic.     Nose: Nose normal.     Mouth/Throat:     Mouth: Mucous membranes are moist.     Pharynx: Oropharynx is clear.  Eyes:     Extraocular Movements: Extraocular movements intact.     Conjunctiva/sclera: Conjunctivae normal.  Cardiovascular:     Rate and Rhythm: Normal rate and regular rhythm.     Heart  sounds: Normal heart sounds.  Pulmonary:     Effort: Pulmonary effort is normal.     Breath sounds: Normal breath sounds.  Abdominal:     General: Abdomen is flat.     Palpations: Abdomen is soft.     Tenderness: There is no abdominal tenderness.  Musculoskeletal:        General: Normal range of motion.     Cervical back: Normal range of motion and neck supple.     Right lower leg: Edema (3+ to shin) present.     Left lower leg: Edema (3+ to shin) present.  Skin:    Comments: Erythema and warmth with multiple weeping wounds to left lower extremity, mild erythema and warmth right lower extremity  Neurological:     General: No focal deficit present.     Mental Status: She is alert and oriented to person, place, and time.  Psychiatric:        Mood and Affect: Mood normal.        Behavior: Behavior normal.           ED Results / Procedures / Treatments   Labs (all labs ordered are listed, but only abnormal results are displayed) Labs Reviewed  BASIC METABOLIC PANEL - Abnormal; Notable for the following components:      Result Value   Sodium 129 (*)    CO2 14 (*)    Glucose, Bld 107 (*)    BUN 53 (*)    Creatinine, Ser 2.16 (*)    GFR, Estimated 24 (*)    All other components within normal limits  CBC WITH DIFFERENTIAL/PLATELET - Abnormal; Notable for the following components:   WBC 12.3 (*)    Neutro Abs 10.4 (*)    Abs Immature Granulocytes 0.19 (*)    All other components within normal limits  LACTIC ACID, PLASMA - Abnormal; Notable for the following components:   Lactic Acid, Venous 2.2 (*)    All other components within normal limits  CULTURE, BLOOD (ROUTINE X 2)    EKG None  Radiology DG Tibia/Fibula Right  Result Date: 06/02/2022 CLINICAL DATA:  Lower extremity wound. Evaluate for disc space infection. EXAM: RIGHT TIBIA AND FIBULA - 2 VIEW COMPARISON:  None Available. FINDINGS: There is diffuse right calf subcutaneous fat edema and swelling. Mild navicular-cuneiform joint space narrowing. Tiny plantar calcaneal heel spur. Mild superior patellar degenerative osteophytosis. Tiny joint effusion of the knee. Moderate lateral compartment of the knee joint space narrowing. The tibial and fibular cortices are intact. No acute fracture or dislocation. IMPRESSION: 1. Diffuse right calf subcutaneous fat edema and swelling. No radiographic evidence of osteomyelitis. 2. Mild-to-moderate lateral compartment of the knee osteoarthritis. Electronically Signed   By: Neita Garnet M.D.   On: 06/02/2022 12:52   DG Tibia/Fibula Left  Result Date: 06/02/2022 CLINICAL DATA:  Lower extremity wound. Evaluate for deep space infection. Cellulitis. EXAM: LEFT TIBIA AND FIBULA - 2 VIEW COMPARISON:  Left tibia and fibula radiographs 10/14/2021; CT left tibia and fibula 10/14/2021 FINDINGS: Moderate  medial knee compartment and severe patellofemoral compartment joint space narrowing, subchondral sclerosis/cystic change, and peripheral osteophytosis. No joint effusion within the knee. Tiny plantar calcaneal heel spur. Moderate midfoot osteoarthritis. Moderate dorsal midfoot soft tissue swelling. Mild lucency overlying the dorsal navicular and cuneiform bones and lateral view presumably is secondary to overlying subcutaneous fat edema lucencies. The tibia and fibula cortices are intact. No acute fracture dislocation. IMPRESSION: 1. Moderate dorsal midfoot soft tissue swelling. Mild lucency overlying  the dorsal navicular and cuneiform bones and lateral view presumably is secondary to overlying subcutaneous fat edema lucencies. 2. No CT evidence of osteomyelitis of the left tibia or fibula. 3. Moderate medial knee compartment and severe patellofemoral compartment osteoarthritis. Electronically Signed   By: Neita Garnet M.D.   On: 06/02/2022 12:50    Procedures Procedures    Medications Ordered in ED Medications  vancomycin (VANCOCIN) IVPB 1000 mg/200 mL premix (1,000 mg Intravenous New Bag/Given 06/02/22 1350)  oxyCODONE (Oxy IR/ROXICODONE) immediate release tablet 5 mg (has no administration in time range)  sodium chloride 0.9 % bolus 500 mL (0 mLs Intravenous Stopped 06/02/22 1322)  sodium chloride 0.9 % bolus 500 mL (500 mLs Intravenous New Bag/Given 06/02/22 1326)    ED Course/ Medical Decision Making/ A&P Clinical Course as of 06/02/22 1357  Thu Jun 02, 2022  1316 Labs with AKI and elevated lactic. In setting of CHF with LE edema she will be given gentle fluid resuscitation. She will require admission for treatment of her cellulitis and AKI. [VK]    Clinical Course User Index [VK] Rexford Maus, DO                             Medical Decision Making This patient presents to the ED with chief complaint(s) of concern for cellulitis with pertinent past medical history of A-fib, HOCM,  CHF which further complicates the presenting complaint. The complaint involves an extensive differential diagnosis and also carries with it a high risk of complications and morbidity.    The differential diagnosis includes cellulitis, sepsis, deep space infection, dehydration, electrolyte abnormality  Additional history obtained: Additional history obtained from n/a Records reviewed Primary Care Documents  ED Course and Reassessment: On patient's arrival to the emergency department she initially had soft blood pressure in the 90s have improved on its own to the 100s systolic.  The patient is otherwise hemodynamically stable.  The patient does have significant erythema and warmth with weeping wounds to her bilateral lower extremities and will have labs including lactate and x-rays to evaluate for deeper infection.  She will be closely reassessed.  Independent labs interpretation:  The following labs were independently interpreted: AKI with creatinine of 2 from baseline normal, mild leukocytosis  Independent visualization of imaging: - I independently visualized the following imaging with scope of interpretation limited to determining acute life threatening conditions related to emergency care: Bilateral lower extremity tib-fib, which revealed no evidence of osteo or deep space infection  Consultation: - Consulted or discussed management/test interpretation w/ external professional: Hospitalists  Consideration for admission or further workup: Patient requires admission for cellulitis with AKI Social Determinants of health: N/A    Amount and/or Complexity of Data Reviewed Labs: ordered. Radiology: ordered.  Risk Prescription drug management. Decision regarding hospitalization.          Final Clinical Impression(s) / ED Diagnoses Final diagnoses:  AKI (acute kidney injury) (HCC)  Cellulitis of left lower extremity    Rx / DC Orders ED Discharge Orders     None          Rexford Maus, DO 06/02/22 1357

## 2022-06-02 NOTE — H&P (Addendum)
History and Physical    Christine Jacobson ZOX:[id number] DOB: 1955-07-01 DOA: 06/02/2022  PCP: Ceasar Lund, PA  Cardiology: Dr. Servando Salina Patient coming from: Home  I have personally briefly reviewed patient's old medical records in [facility] Link  Chief Complaint: Lower extremity wounds/sent from PCPs office due to low blood pressure  HPI: Christine Jacobson is a 67 y.o. female with medical history significant of lower extremity wounds initially being followed at the wound care center and currently being self treated per patient as she did not like the lady at the wound care center and process of being referred, history of A-fib on Eliquis,HOCM, CHF who presented to the ED from PCPs office with lower extremity wounds.  Patient states wounds have been managed at the wound care center for several months.  Currently self treating using topical Neosporin and washing them.  Patient seen at PCPs office and it was felt wounds were looking worse with low blood pressure patient subsequently sent to the ED.  Patient denies any fevers, no diarrhea, no constipation, no chest pain, no nausea, no vomiting, no abdominal pain, no diarrhea, no constipation.  No melena, no hematemesis, no hematochezia.  Patient does endorse decreased oral intake.  Patient endorses some lightheadedness, some chills, some intermittent shortness of breath off and on for about a week.  ED Course: Patient seen in the ED, noted to have systolic blood pressures in the 90s with a pulse of 82, sats of 98% on room air.  Basic metabolic profile with a sodium of 129, bicarb of 14, glucose of 107, BUN of 53, creatinine of 2.16 otherwise within normal limits.  Lactic acid level noted at 2.2.  CBC with a white count of 12.3 with a left shift otherwise within normal limits.  Blood cultures ordered and pending.  Plain films of the left tib-fib with moderate dorsal midfoot soft tissue swelling.  Mild lucency overlying the dorsal navicular and cuneiform bones  and lateral view presumably secondary to overlying subcutaneous fat edema and lucency.  No CT evidence of osteomyelitis of the left hip fib.  Moderate medial knee compartment and severe patellofemoral compartment osteoarthritis.  Plain films of the right tib-fib with diffuse right calf subcutaneous fat edema and swelling, no radiographic evidence of osteomyelitis.  Mild to moderate lateral compartment of the knee osteoarthritis.  Patient given IV vancomycin hospitalist were called to admit the patient for further evaluation and management.  Review of Systems: As per HPI otherwise all other systems reviewed and are negative.  Past Medical History:  Diagnosis Date   Chest pain 11/22/2010   2D STRESS ECHO - EF 60%, peak stress EF 80%, normal, no evidence for stress-induced ischemia   HOCM (hypertrophic obstructive cardiomyopathy) (HCC) 06/21/2011   2D ECHO - EF >55%, normal   HTN (hypertension)    Hyperprolactinemia (HCC)    dx in her 18, took meds, self d/c a while back   Liver function test abnormality    normal when repeated   Mild hyperlipidemia    Obesity    Palpitations    negative stress echo in November 2012 with normal LV function; mild LVH, proximal septal thickening with narrow LVOT and mild gradient; mild MR and TR; Cardionet showed PACs in November 2012   Pyoderma gangrenosa    Shortness of breath 07/11/2011   MET TEST    Past Surgical History:  Procedure Laterality Date   CARDIOVERSION N/A 02/01/2022   Procedure: CARDIOVERSION;  Surgeon: Thomasene Ripple, DO;  Location: [facility] ENDOSCOPY;  Service: Cardiovascular;  Laterality: N/A;   RIGHT HEART CATH N/A 10/15/2021   Procedure: RIGHT HEART CATH;  Surgeon: Elder Negus, MD;  Location: [facility] INVASIVE CV LAB;  Service: Cardiovascular;  Laterality: N/A;   SKIN GRAFT  2006   porcine, R leg    Social History  reports that she has quit smoking. Her smoking use included cigarettes. She has a 4.00 pack-year smoking history. She has never  used smokeless tobacco. She reports that she does not currently use alcohol after a past usage of about 1.0 standard drink of alcohol per week. She reports that she does not use drugs.  Allergies  Allergen Reactions   Pork-Derived Products Other (See Comments)    Pt is Jewish    Family History  Problem Relation Age of Onset   Heart disease Mother        endocarditis   Ovarian cancer Mother    Emphysema Father    Coronary artery disease Other        F in his 17   Diabetes Other        GP   Cancer Neg Hx    Mother deceased age 72 from ovarian cancer.  Father deceased age unknown from CHF.  Prior to Admission medications   Medication Sig Start Date End Date Taking? Authorizing Provider  apixaban (ELIQUIS) 5 MG TABS tablet Take 1 tablet (5 mg total) by mouth 2 (two) times daily. 02/23/22   Tobb, Kardie, DO  diltiazem (CARDIZEM CD) 180 MG 24 hr capsule TAKE ONE CAPSULE BY MOUTH EVERY MORNING AND TAKE ONE CAPSULE BY MOUTH EVERY NIGHT AT BEDTIME 02/03/22   Tolia, Sunit, DO  ibuprofen (ADVIL) 200 MG tablet Take 400 mg by mouth every 8 (eight) hours as needed for moderate pain.    [provider]  JARDIANCE 10 MG TABS tablet Take 10 mg by mouth daily. 01/27/22   [provider]  metoprolol succinate (TOPROL-XL) 100 MG 24 hr tablet TAKE ONE TABLET BY MOUTH TWICE A DAY WITH OR IMMEDIATELY FOLLOWING A MEAL 03/02/22   Tobb, Kardie, DO  potassium chloride SA (KLOR-CON M) 20 MEQ tablet Take 1 tablet (20 mEq total) by mouth daily. 03/02/22   Tobb, Kardie, DO  sertraline (ZOLOFT) 50 MG tablet Take 1 tablet (50 mg total) by mouth at bedtime. 10/29/21   Yates Decamp, MD  torsemide (DEMADEX) 20 MG tablet Take 40 mg (2 tablets) twice daily for 7 days then 40 mg (2 tablets) once daily. 02/10/22   Thomasene Ripple, DO    Physical Exam: Vitals:   06/02/22 1145 06/02/22 1215 06/02/22 1500 06/02/22 1530  BP: 106/64 112/74 98/69 119/77  Pulse: 68 72 82 79  Resp: 14 15 14 16   Temp:   97.9 F (36.6  C)   TempSrc:   Oral   SpO2: 98% 100% 98% 98%  Weight:      Height:        Constitutional: NAD, calm, comfortable Vitals:   06/02/22 1145 06/02/22 1215 06/02/22 1500 06/02/22 1530  BP: 106/64 112/74 98/69 119/77  Pulse: 68 72 82 79  Resp: 14 15 14 16   Temp:   97.9 F (36.6 C)   TempSrc:   Oral   SpO2: 98% 100% 98% 98%  Weight:      Height:       Eyes: PERRL, lids and conjunctivae normal ENMT: Mucous membranes are dry. Posterior pharynx clear of any exudate or lesions.Normal dentition.  Neck: normal, supple, no masses, no thyromegaly  Respiratory: clear to auscultation bilaterally, no wheezing, no crackles.  No rhonchi.  Normal respiratory effort. No accessory muscle use.  Cardiovascular: Regular rate and rhythm, with  3/6 SEM.  2+ pedal edema.  Abdomen: Abdomen is soft, nondistended, no tenderness, no masses palpated. No hepatosplenomegaly. Bowel sounds positive.  Musculoskeletal: no clubbing / cyanosis. No joint deformity upper and lower extremities. Good ROM, no contractures. Normal muscle tone.  Skin: Bilateral lower extremities with multiple areas of erythema, some excoriations, some weeping left greater than right. Neurologic: CN 2-12 grossly intact. Sensation intact, DTR normal. Strength 5/5 in all 4.  Psychiatric: Normal judgment and insight. Alert and oriented x 3. Normal mood.          Labs on Admission: I have personally reviewed following labs and imaging studies  CBC: Recent Labs  Lab 06/02/22 1123  WBC 12.3*  NEUTROABS 10.4*  HGB 12.7  HCT 38.7  MCV 95.3  PLT 305    Basic Metabolic Panel: Recent Labs  Lab 06/02/22 1123  NA 129*  K 4.5  CL 103  CO2 14*  GLUCOSE 107*  BUN 53*  CREATININE 2.16*  CALCIUM 9.0    GFR: Estimated Creatinine Clearance: 24.9 mL/min (A) (by C-G formula based on SCr of 2.16 mg/dL (H)).  Liver Function Tests: No results for input(s): "AST", "ALT", "ALKPHOS", "BILITOT", "PROT", "ALBUMIN" in the last 168  hours.  Urine analysis: No results found for: "COLORURINE", "APPEARANCEUR", "LABSPEC", "PHURINE", "GLUCOSEU", "HGBUR", "BILIRUBINUR", "KETONESUR", "PROTEINUR", "UROBILINOGEN", "NITRITE", "LEUKOCYTESUR"  Radiological Exams on Admission: DG Tibia/Fibula Right  Result Date: 06/02/2022 CLINICAL DATA:  Lower extremity wound. Evaluate for disc space infection. EXAM: RIGHT TIBIA AND FIBULA - 2 VIEW COMPARISON:  None Available. FINDINGS: There is diffuse right calf subcutaneous fat edema and swelling. Mild navicular-cuneiform joint space narrowing. Tiny plantar calcaneal heel spur. Mild superior patellar degenerative osteophytosis. Tiny joint effusion of the knee. Moderate lateral compartment of the knee joint space narrowing. The tibial and fibular cortices are intact. No acute fracture or dislocation. IMPRESSION: 1. Diffuse right calf subcutaneous fat edema and swelling. No radiographic evidence of osteomyelitis. 2. Mild-to-moderate lateral compartment of the knee osteoarthritis. Electronically Signed   By: Neita Garnet M.D.   On: 06/02/2022 12:52   DG Tibia/Fibula Left  Result Date: 06/02/2022 CLINICAL DATA:  Lower extremity wound. Evaluate for deep space infection. Cellulitis. EXAM: LEFT TIBIA AND FIBULA - 2 VIEW COMPARISON:  Left tibia and fibula radiographs 10/14/2021; CT left tibia and fibula 10/14/2021 FINDINGS: Moderate medial knee compartment and severe patellofemoral compartment joint space narrowing, subchondral sclerosis/cystic change, and peripheral osteophytosis. No joint effusion within the knee. Tiny plantar calcaneal heel spur. Moderate midfoot osteoarthritis. Moderate dorsal midfoot soft tissue swelling. Mild lucency overlying the dorsal navicular and cuneiform bones and lateral view presumably is secondary to overlying subcutaneous fat edema lucencies. The tibia and fibula cortices are intact. No acute fracture dislocation. IMPRESSION: 1. Moderate dorsal midfoot soft tissue swelling. Mild  lucency overlying the dorsal navicular and cuneiform bones and lateral view presumably is secondary to overlying subcutaneous fat edema lucencies. 2. No CT evidence of osteomyelitis of the left tibia or fibula. 3. Moderate medial knee compartment and severe patellofemoral compartment osteoarthritis. Electronically Signed   By: Neita Garnet M.D.   On: 06/02/2022 12:50    EKG: Not done.  Assessment/Plan Principal Problem:   Sepsis due to cellulitis [facility]) Active Problems:   Cellulitis   Mild hyperlipidemia   Murmur   Hypertension   HOCM (hypertrophic obstructive cardiomyopathy) (HCC)  Chronic diastolic heart failure (HCC)   Persistent atrial fibrillation (HCC)   ARF (acute renal failure) (HCC)   Hyponatremia   Dehydration   Metabolic acidosis   #1 probable early sepsis secondary to cellulitis -Patient presented from PCPs office with sob/borderline blood pressure, bilateral lower extremity cellulitis L > R, patient noted with a acidosis with bicarb of 14, acute renal failure with a creatinine of 2.16.  CBC with a leukocytosis with white count of 12.3.  Lactic acid level elevated at 2.2. -Blood cultures ordered and pending. -Check a UA with cultures and sensitivities, CRP, chest x-ray. -Placed empirically on IV vancomycin, IV cefepime. -Consult with wound care RN. -Placed on bicarb drip. -Supportive care.  2.  Acute renal failure/metabolic acidosis -Likely secondary to prerenal azotemia in the setting of diuretics of Demadex which patient was on prior to admission. -Creatinine noted at 2.16 from 0.9 (02/10/2022). -Patient noted to have borderline blood pressure with systolics in the 90s on presentation. -Check a UA with cultures and sensitivities, check a urine sodium, urine creatinine. -Patient noted with a bicarb of 14. -Pancultured. -Place empirically on IV antibiotics as noted above. -Placed on bicarb drip. -Repeat labs in the AM. -Check a renal ultrasound. -If worsening  renal function will need to formally consult nephrology for further evaluation and management.  3.  Paroxysmal atrial fibrillation -Currently normal sinus rhythm. -BP soft and as such we will hold, home regimen Cardizem. -Decrease home regimen Toprol-XL to 25 mg daily. -Continue Eliquis for anticoagulation.  4.  Hyponatremia -Likely secondary to hypovolemic hyponatremia in the setting of diuretics. -Check a urine sodium, urine creatinine, urine and serum osmolality. -IV fluids. -Repeat labs in the AM.  5.  Dehydration -IV fluids.  6.  Chronic diastolic CHF/HOCM -Stable. -Due to soft blood pressure will place on decreased dose of Toprol-XL at 25 mg daily. -Monitor volume status with hydration.  7.  Hyperlipidemia -Check a fasting lipid panel.   DVT prophylaxis: Heparin  Code Status:   DNR Family Communication:  Updated patient.  No family at bedside. Disposition Plan:   Patient is from:  Home  Anticipated DC to:  Home  Anticipated DC date:  3 to 4 days  Anticipated DC barriers: Clinical improvement  Consults called:  None Admission status:  Admit to inpatient/progressive care unit  Severity of Illness: The appropriate patient status for this patient is INPATIENT. Inpatient status is judged to be reasonable and necessary in order to provide the required intensity of service to ensure the patient's safety. The patient's presenting symptoms, physical exam findings, and initial radiographic and laboratory data in the context of their chronic comorbidities is felt to place them at high risk for further clinical deterioration. Furthermore, it is not anticipated that the patient will be medically stable for discharge from the hospital within 2 midnights of admission.   * I certify that at the point of admission it is my clinical judgment that the patient will require inpatient hospital care spanning beyond 2 midnights from the point of admission due to high intensity of service, high  risk for further deterioration and high frequency of surveillance required.*    Ramiro Harvest MD [facility]  How to contact the [facility] Attending or Consulting provider 7A - 7P or covering provider during after hours 7P -7A, for this patient?   Check the care team in [facility] and look for a) attending/consulting [facility] provider listed and b) the [facility] team listed Log into [email or web address] and use [place]'s universal password  to access. If you do not have the password, please contact the hospital operator. Locate the [facility] provider you are looking for under [facility] and page to a number that you can be directly reached. If you still have difficulty reaching the provider, please page the [facility] (Director on Call) for the Hospitalists listed on amion for assistance.  06/02/2022, 5:16 PM

## 2022-06-02 NOTE — Progress Notes (Addendum)
TRH night cross cover note:   I was notified by RN of the patient's request for as needed Benadryl for pruritus.  I subsequently placed order for prn PO Benadryl for pruritus.    Newton Pigg, DO Hospitalist

## 2022-06-02 NOTE — ED Triage Notes (Signed)
Pt BIB [facility] from physicians office for cellulitis on legs.  Pt has taken her metoprolol today.  Hx CHF.  VS BP 90/60, HR 60, CBG 119.  Pt given NS en route.

## 2022-06-02 NOTE — Progress Notes (Signed)
Pharmacy Antibiotic Note  Christine Jacobson is a 67 y.o. female admitted on 06/02/2022 with  wound infection .  Pharmacy has been consulted for vancomycin and cefepime dosing.  Patient presented with bilateral leg edema with left lower leg weeping. WBC12.3, lactate 2.2, afebrile, HR 80s, RR <20/min, and BP now 100s systolic. Currently with significant AKI, Scr 2.16 - baseline 0.91.  Plan: Vancomycin 1500 mg load, then future doses per levels due to unstable renal function Cefepime 2g every 24 hours F/u signs and symptoms of infection to alter antibiotic selection and duration  Height: 5\' 4"  (162.6 cm) Weight: 78.5 kg (173 lb) IBW/kg (Calculated) : 54.7  Temp (24hrs), Avg:97.8 F (36.6 C), Min:97.7 F (36.5 C), Max:97.9 F (36.6 C)  Recent Labs  Lab 06/02/22 1123 06/02/22 1142  WBC 12.3*  --   CREATININE 2.16*  --   LATICACIDVEN  --  2.2*    Estimated Creatinine Clearance: 24.9 mL/min (A) (by C-G formula based on SCr of 2.16 mg/dL (H)).    Allergies  Allergen Reactions   Pork-Derived Products Other (See Comments)    Pt is Jewish    Antimicrobials this admission: Vancomycin 5/16 >>   Cefepime 5/16 >>    Microbiology results: pending  Thank you for involving pharmacy in this patient's care.  Enos Fling, PharmD PGY2 Pharmacy Resident 06/02/2022 5:26 PM

## 2022-06-02 NOTE — Plan of Care (Signed)

## 2022-06-03 DIAGNOSIS — L03116 Cellulitis of left lower limb: Secondary | ICD-10-CM

## 2022-06-03 DIAGNOSIS — L0390 Cellulitis, unspecified: Secondary | ICD-10-CM | POA: Diagnosis not present

## 2022-06-03 DIAGNOSIS — I5032 Chronic diastolic (congestive) heart failure: Secondary | ICD-10-CM | POA: Diagnosis not present

## 2022-06-03 DIAGNOSIS — N179 Acute kidney failure, unspecified: Secondary | ICD-10-CM | POA: Diagnosis not present

## 2022-06-03 LAB — HEMOGLOBIN A1C
Hgb A1c MFr Bld: 5 % (ref 4.8–5.6)
Mean Plasma Glucose: 96.8 mg/dL

## 2022-06-03 LAB — COMPREHENSIVE METABOLIC PANEL
ALT: 44 U/L (ref 0–44)
AST: 29 U/L (ref 15–41)
Albumin: 2.3 g/dL — ABNORMAL LOW (ref 3.5–5.0)
Alkaline Phosphatase: 305 U/L — ABNORMAL HIGH (ref 38–126)
Anion gap: 11 (ref 5–15)
BUN: 50 mg/dL — ABNORMAL HIGH (ref 8–23)
CO2: 15 mmol/L — ABNORMAL LOW (ref 22–32)
Calcium: 8.3 mg/dL — ABNORMAL LOW (ref 8.9–10.3)
Chloride: 102 mmol/L (ref 98–111)
Creatinine, Ser: 2.04 mg/dL — ABNORMAL HIGH (ref 0.44–1.00)
GFR, Estimated: 26 mL/min — ABNORMAL LOW (ref >60)
Glucose, Bld: 123 mg/dL — ABNORMAL HIGH (ref 70–99)
Potassium: 4.3 mmol/L (ref 3.5–5.1)
Sodium: 128 mmol/L — ABNORMAL LOW (ref 135–145)
Total Bilirubin: 0.4 mg/dL (ref 0.3–1.2)
Total Protein: 5.9 g/dL — ABNORMAL LOW (ref 6.5–8.1)

## 2022-06-03 LAB — CBC WITH DIFFERENTIAL/PLATELET
Abs Immature Granulocytes: 0.1 10*3/uL — ABNORMAL HIGH (ref 0.00–0.07)
Basophils Absolute: 0.1 10*3/uL (ref 0.0–0.1)
Basophils Relative: 1 %
Eosinophils Absolute: 0.1 10*3/uL (ref 0.0–0.5)
Eosinophils Relative: 1 %
HCT: 32.7 % — ABNORMAL LOW (ref 36.0–46.0)
Hemoglobin: 10.9 g/dL — ABNORMAL LOW (ref 12.0–15.0)
Immature Granulocytes: 1 %
Lymphocytes Relative: 6 %
Lymphs Abs: 0.7 10*3/uL (ref 0.7–4.0)
MCH: 30.8 pg (ref 26.0–34.0)
MCHC: 33.3 g/dL (ref 30.0–36.0)
MCV: 92.4 fL (ref 80.0–100.0)
Monocytes Absolute: 1.2 10*3/uL — ABNORMAL HIGH (ref 0.1–1.0)
Monocytes Relative: 9 %
Neutro Abs: 10.8 10*3/uL — ABNORMAL HIGH (ref 1.7–7.7)
Neutrophils Relative %: 82 %
Platelets: 227 10*3/uL (ref 150–400)
RBC: 3.54 MIL/uL — ABNORMAL LOW (ref 3.87–5.11)
RDW: 15.1 % (ref 11.5–15.5)
WBC: 12.9 10*3/uL — ABNORMAL HIGH (ref 4.0–10.5)
nRBC: 0 % (ref 0.0–0.2)

## 2022-06-03 LAB — LIPID PANEL
Cholesterol: 228 mg/dL — ABNORMAL HIGH (ref 0–200)
HDL: 48 mg/dL (ref >40)
LDL Cholesterol: 146 mg/dL — ABNORMAL HIGH (ref 0–99)
Total CHOL/HDL Ratio: 4.8 RATIO
Triglycerides: 172 mg/dL — ABNORMAL HIGH (ref <150)
VLDL: 34 mg/dL (ref 0–40)

## 2022-06-03 LAB — URINALYSIS, ROUTINE W REFLEX MICROSCOPIC
Bacteria, UA: NONE SEEN
Bilirubin Urine: NEGATIVE
Glucose, UA: NEGATIVE mg/dL
Ketones, ur: NEGATIVE mg/dL
Nitrite: NEGATIVE
Protein, ur: 30 mg/dL — AB
Specific Gravity, Urine: 1.019 (ref 1.005–1.030)
pH: 5 (ref 5.0–8.0)

## 2022-06-03 LAB — OSMOLALITY, URINE: Osmolality, Ur: 556 mOsm/kg (ref 300–900)

## 2022-06-03 LAB — LACTIC ACID, PLASMA: Lactic Acid, Venous: 1.4 mmol/L (ref 0.5–1.9)

## 2022-06-03 LAB — CREATININE, URINE, RANDOM: Creatinine, Urine: 120 mg/dL

## 2022-06-03 LAB — SODIUM, URINE, RANDOM: Sodium, Ur: <10 mmol/L

## 2022-06-03 LAB — CULTURE, BLOOD (ROUTINE X 2): Special Requests: ADEQUATE

## 2022-06-03 MED ORDER — ROSUVASTATIN CALCIUM 20 MG PO TABS
20.0000 mg | ORAL_TABLET | Freq: Every day | ORAL | Status: DC
  Administered 2022-06-03 – 2022-06-09 (×7): 20 mg via ORAL
  Filled 2022-06-03 (×7): qty 1

## 2022-06-03 MED ORDER — TRIAMCINOLONE ACETONIDE 0.1 % EX CREA
TOPICAL_CREAM | Freq: Every day | CUTANEOUS | Status: DC
  Filled 2022-06-03: qty 15

## 2022-06-03 MED ORDER — VANCOMYCIN HCL IN DEXTROSE 1-5 GM/200ML-% IV SOLN
1000.0000 mg | INTRAVENOUS | Status: DC
  Administered 2022-06-04: 1000 mg via INTRAVENOUS
  Filled 2022-06-03: qty 200

## 2022-06-03 NOTE — Progress Notes (Signed)
  Transition of Care (TOC) Screening Note   Patient Details  Name: Christine Jacobson Date of Birth: 12/26/1955   Transition of Care [facility]) CM/SW Contact:    Harriet Masson, RN Phone Number: 06/03/2022, 8:13 AM    Transition of Care Department [facility]) has reviewed patient.  We will continue to monitor patient advancement through interdisciplinary progression rounds.  TOC consult for Home health and DME needs.  Will follow for PT,OT recommendations.

## 2022-06-03 NOTE — Evaluation (Signed)
Physical Therapy Evaluation Patient Details Name: Christine Jacobson MRN: [id number] DOB: November 11, 1955 Today's Date: 06/03/2022  History of Present Illness  67 y.o. female admitted on 06/02/2022 from PCP office with L LE wound infection.  Presented with bilateral leg edema with left lower leg weeping. WBC12.3, lactate 2.2, afebrile, HR 80s, RR <20/min, and BP now 100s systolic. Currently with significant AKI, Scr 2.16 - baseline 0.91. PMH: A-fib on Eliquis, HOCM, CHF  Clinical Impression  PTA pt living in apartment with ramped entrance and shared kitchen/laundry. Pt reports ambulation household distances mainly with RW, use [employer] for groceries and shuttle for healthcare appointments. Pt bird bathes at baseline and is able to perform other ADLs/iADLs independently. Pt has had recent breakup and has no one to assist her, friend who speaks with her daily over phone. Pt is currently limited in safe mobility by B LE pain from wounds, in presence of decreased strength and endurance. Pt is min guard for bed mobility, transfers and ambulation with RW. Pt reports that taking care of herself is getting more difficult and she would appreciate help looking into ALF possibilities. Agreeable to post acute PT rehab. PT will continue to follow acutely.       Recommendations for follow up therapy are one component of a multi-disciplinary discharge planning process, led by the attending physician.  Recommendations may be updated based on patient status, additional functional criteria and insurance authorization.     Assistance Recommended at Discharge PRN  Patient can return home with the following  Assist for transportation    Equipment Recommendations None recommended by PT     Functional Status Assessment Patient has had a recent decline in their functional status and demonstrates the ability to make significant improvements in function in a reasonable and predictable amount of time.     Precautions /  Restrictions Precautions Precautions: Fall Precaution Comments: no falls in last 6 months Restrictions Weight Bearing Restrictions: No      Mobility  Bed Mobility Overal bed mobility: Needs Assistance Bed Mobility: Supine to Sit     Supine to sit: HOB elevated, Min guard     General bed mobility comments: min guard for safet, increased time and effort to come to EoB    Transfers Overall transfer level: Needs assistance Equipment used: Rolling walker (2 wheels) Transfers: Sit to/from Stand Sit to Stand: Min guard           General transfer comment: min guard for safety, vc for hand placement, increased time and effort to come to standing    Ambulation/Gait Ambulation/Gait assistance: Min guard Gait Distance (Feet): 20 Feet Assistive device: Rolling walker (2 wheels) Gait Pattern/deviations: Step-through pattern, Decreased step length - right, Decreased step length - left, Shuffle, Trunk flexed, Wide base of support Gait velocity: slowed Gait velocity interpretation: <1.31 ft/sec, indicative of household ambulator   General Gait Details: min guard for safety, slowed shuffling gait        Balance Overall balance assessment: Needs assistance Sitting-balance support: Feet supported, No upper extremity supported Sitting balance-Leahy Scale: Good     Standing balance support: During functional activity, Single extremity supported, Bilateral upper extremity supported Standing balance-Leahy Scale: Fair Standing balance comment: benefits from UE support with dynamic activity                             Pertinent Vitals/Pain Pain Assessment Pain Assessment: 0-10 Pain Score: 2  Pain Location: B LE Pain Descriptors /  Indicators: Sore Pain Intervention(s): Limited activity within patient's tolerance, Monitored during session, Repositioned    Home Living Family/patient expects to be discharged to:: Private residence Living Arrangements: Alone Available  Help at Discharge: Other (Comment) (reports no assist available) Type of Home: Apartment Home Access: Ramped entrance       Home Layout: One level Home Equipment: Agricultural consultant (2 wheels)      Prior Function               Mobility Comments: use RW when she first gets up, sometimes goes without ADLs Comments: grocery shops through [employer], bird bathes at baseline, independent with ADLs and iADLs, kitchen and laundry are communal     Hand Dominance   Dominant Hand: Right    Extremity/Trunk Assessment   Upper Extremity Assessment Upper Extremity Assessment: Defer to OT evaluation    Lower Extremity Assessment Lower Extremity Assessment: RLE deficits/detail;LLE deficits/detail RLE Deficits / Details: LE wrapped, dressing recently changed, clean dry and intact. ROM WFL, Strength grossly 3+/5 RLE Sensation: decreased light touch LLE Deficits / Details: LE wrapped, dressing recently changed, clean dry and intact. ROM WFL, Strength grossly 3+/5 LLE Sensation: decreased light touch       Communication   Communication: No difficulties  Cognition Arousal/Alertness: Awake/alert Behavior During Therapy: Flat affect Overall Cognitive Status: Within Functional Limits for tasks assessed                                 General Comments: lonely, one friend, recent break        General Comments General comments (skin integrity, edema, etc.): VSS on RA, pt with bout of coughing and burping, nausea after getting up to chair, pt states this is her normal on getting up and moving around        Assessment/Plan    PT Assessment Patient needs continued PT services  PT Problem List Decreased strength;Decreased activity tolerance;Decreased balance;Decreased mobility;Cardiopulmonary status limiting activity;Impaired sensation;Pain;Decreased skin integrity       PT Treatment Interventions DME instruction;Gait training;Functional mobility training;Therapeutic  activities;Therapeutic exercise;Balance training;Cognitive remediation;Patient/family education    PT Goals (Current goals can be found in the Care Plan section)  Acute Rehab PT Goals Patient Stated Goal: improve mobility PT Goal Formulation: With patient Time For Goal Achievement: 06/17/22 Potential to Achieve Goals: Fair    Frequency Min 1X/week        AM-PAC PT "6 Clicks" Mobility  Outcome Measure Help needed turning from your back to your side while in a flat bed without using bedrails?: None Help needed moving from lying on your back to sitting on the side of a flat bed without using bedrails?: None Help needed moving to and from a bed to a chair (including a wheelchair)?: A Little Help needed standing up from a chair using your arms (e.g., wheelchair or bedside chair)?: A Little Help needed to walk in hospital room?: A Little Help needed climbing 3-5 steps with a railing? : A Lot 6 Click Score: 19    End of Session   Activity Tolerance: Patient limited by pain (B LE pain in dependent position) Patient left: in chair;with call bell/phone within reach;with chair alarm set Nurse Communication: Mobility status PT Visit Diagnosis: Other abnormalities of gait and mobility (R26.89);Muscle weakness (generalized) (M62.81);Difficulty in walking, not elsewhere classified (R26.2);Pain Pain - Right/Left:  (bilateral) Pain - part of body: Leg    Time: 1610-9604 PT Time Calculation (min) (  ACUTE ONLY): 56 min   Charges:   PT Evaluation $PT Eval Moderate Complexity: 1 Mod PT Treatments $Gait Training: 8-22 mins $Therapeutic Activity: 8-22 mins        Roxanna Mcever B. Beverely Risen PT, DPT Acute Rehabilitation Services Please use secure chat or  Call Office [phone]   Elon Alas [facility] 06/03/2022, 9:50 AM

## 2022-06-03 NOTE — TOC Progression Note (Signed)
Transition of Care [facility]) - Progression Note    Patient Details  Name: [name] MRN: [id number] Date of Birth: [birth year]  Transition [facility] Care [facility]) CM/SW Contact  Harriet Masson, RN Phone Number: 06/03/2022, 1:00 PM  Clinical Narrative:    Patient now agreeing to home health.   Choice offered an patient defers to [facility] to find [facility] agency.  Kandee Keen with [facility] accepted referral.   Address, phone number and PCP confirmed.  Need home health PT,OT, RN orders.   Expected Discharge Plan: Home w Home Health Services Barriers to Discharge: Continued Medical Work up  Expected Discharge Plan and Services       Living arrangements for the past 2 months: Single Family Home                           HH Arranged: PT, OT, RN [facility] Agency: [facility] Date [facility] Agency Contacted: 06/03/22 Time HH Agency Contacted: 1300 Representative spoke with at [facility] Agency: Kandee Keen   Social Determinants of Health (SDOH) Interventions SDOH Screenings   Food Insecurity: Food Insecurity Present (06/02/2022)  Housing: Low Risk  (06/02/2022)  Transportation Needs: No Transportation Needs (06/02/2022)  Utilities: Not At Risk (06/02/2022)  Depression (PHQ2-9): Low Risk  (10/26/2021)  Financial Resource Strain: Low Risk  (12/03/2021)  Tobacco Use: Medium Risk (06/02/2022)    Readmission Risk Interventions     No data to display

## 2022-06-03 NOTE — Progress Notes (Signed)
[facility]                                                                              Christine Jacobson, is a 67 y.o. female, DOB - Jun 11, 1955, ZOX:[id number] Admit date - 06/02/2022    Outpatient Primary MD for the patient is Turmel, Caleb P, PA  LOS - 1  days  No chief complaint on file.      Brief summary   Patient is a 67 year old female HTN, hyperlipidemia, atrial fibrillation on Eliquis, HOCM chronic diastolic CHF presented to ED from the PCPs office with lower extremity wounds.  Patient reported that wounds have been managed at the wound care center for the last several months.  Currently she was self treating them by using topical Neosporin and washing them.  She was seen at her PCPs office and it was felt that the wounds were looking worse.  Patient was also noted to have low BP and hence sent to ED due to concern for sepsis.  Patient did report some lightheadedness, chills, intermittent shortness of breath off and on for about a week In the ED, BP 106/64, down to 90/57, sats 98% on room air, pulse 82, afebrile. CBC with white count 12.3 with left shift, lactic acid 2.2. X-ray tib-fib showed moderate dorsal midfoot soft tissue swelling  Assessment & Plan    Principal Problem: Severe sepsis due to cellulitis (HCC), bilateral lower extremity, POA -Presented with hypotension, bilateral lower extremity cellulitis, acute kidney injury, lactic acidosis -Follow blood cultures, urine cultures -Continue IV vancomycin, cefepime -Obtain ABIs -Wound care consulted   Acute kidney injury, metabolic acidosis -Creatinine 8.11 on admission, baseline 0.8-0.9 -Follow urine culture, blood cultures -Renal ultrasound showed no hydronephrosis.  Incompletely assessed possible cystic masses of the pelvis likely within the bilateral adnexa, recommended CT abdomen pelvis -Patient was placed on bicarb drip, creatinine improving to 2.0 -Continue to hold Demadex  Paroxysmal  A-fib -Currently NSR, BP stable -Continue Toprol-XL, Cardizem currently held -Continue eliquis     Hyponatremia -Likely secondary to hypovolemic hyponatremia in the setting of diuretics. -Sodium 129 on admission, CO2 14, creatinine 2.16.  Placed on sodium bicarb drip.  Sodium trended down to 128 this morning. -Urine osmolality 556, UNA <10, serum osmolality 295, -KVO IV fluids, DC ibuprofen, Jardiance, torsemide   Chronic diastolic CHF/HOCM -Continue decreased dose of Toprol-XL 25 mg daily -Diuretics currently on hold, will DC IV fluids  Hyperlipidemia -Lipid panel showed triglycerides 172, LDL 146, cholesterol 228 -Placed on Crestor 20 mg daily  Obesity Estimated body mass index is 33.38 kg/m as calculated from the following:   Height as of this encounter: 5\' 4"  (1.626 m).   Weight as of this encounter: 88.2 kg.  Code Status: DNR DVT Prophylaxis:   apixaban (ELIQUIS) tablet 5 mg   Level of Care: Level of care: Progressive Family Communication: Updated patient Disposition Plan:      Remains inpatient appropriate:      Procedures:    Consultants:     Antimicrobials:   Anti-infectives (From admission, onward)    Start     Dose/Rate Route Frequency Ordered Stop   06/04/22 1700  vancomycin (VANCOCIN) IVPB 1000 mg/200 mL premix        1,000 mg 200 mL/hr over 60 Minutes Intravenous Every 48 hours 06/03/22 0813     06/02/22 1744  vancomycin variable dose per unstable renal function (pharmacist dosing)  Status:  Discontinued         Does not apply See admin instructions 06/02/22 1744 06/03/22 0812   06/02/22 1730  ceFEPIme (MAXIPIME) 2 g in sodium chloride 0.9 % 100 mL IVPB        2 g 200 mL/hr over 30 Minutes Intravenous Every 24 hours 06/02/22 1723     06/02/22 1730  vancomycin (VANCOREADY) IVPB 1500 mg/300 mL        1,500 mg 150 mL/hr over 120 Minutes Intravenous  Once 06/02/22 1730 06/02/22 2329   06/02/22 1345  vancomycin (VANCOCIN) IVPB 1000 mg/200 mL premix         1,000 mg 200 mL/hr over 60 Minutes Intravenous  Once 06/02/22 1344 06/02/22 1533          Medications  apixaban  5 mg Oral BID   metoprolol succinate  25 mg Oral Daily   pneumococcal 20-valent conjugate vaccine  0.5 mL Intramuscular Tomorrow-1000   sertraline  50 mg Oral QHS   sodium chloride flush  3 mL Intravenous Q12H   triamcinolone cream   Topical Daily      Subjective:   Christine Jacobson was seen and examined today.  States dressing on the lower extremities was just changed this morning prior to my daughter and it looked better to her from admission.  Still has redness.  No dizziness, chest pain or acute shortness of breath.  No nausea vomiting or abdominal pain.  No fevers BP still soft   Objective:   Vitals:   06/02/22 2318 06/03/22 0300 06/03/22 0410 06/03/22 0717  BP: (!) 90/57  100/66 132/74  Pulse: 86 78 82 89  Resp: 15 15 19 18   Temp: 97.8 F (36.6 C)  97.6 F (36.4 C) 97.7 F (36.5 C)  TempSrc: Oral  Oral Oral  SpO2: 95% 95% 98% 94%  Weight:  88.2 kg    Height:        Intake/Output Summary (Last 24 hours) at 06/03/2022 1057 Last data filed at 06/03/2022 0720 Gross per 24 hour  Intake 892.08 ml  Output 300 ml  Net 592.08 ml     Wt Readings from Last 3 Encounters:  06/03/22 88.2 kg  02/10/22 99.5 kg  02/01/22 86.2 kg     Exam General: Alert and oriented x 3, NAD Cardiovascular: S1 S2 auscultated,  RRR Respiratory: CTAB, no wheezing Gastrointestinal: Soft, nontender, nondistended, + bowel sounds Ext: both lower extremities dressing intact Neuro: no new deficits Psych: Normal affect     Data Reviewed:  I have personally reviewed following labs    CBC Lab Results  Component Value Date   WBC 12.9 (H) 06/03/2022   RBC 3.54 (L) 06/03/2022   HGB 10.9 (L) 06/03/2022   HCT 32.7 (L) 06/03/2022   MCV 92.4 06/03/2022   MCH 30.8 06/03/2022   PLT 227 06/03/2022   MCHC 33.3 06/03/2022   RDW 15.1 06/03/2022   LYMPHSABS 0.7 06/03/2022    MONOABS 1.2 (H) 06/03/2022   EOSABS 0.1 06/03/2022   BASOSABS 0.1 06/03/2022     Last metabolic panel Lab Results  Component Value Date   NA 128 (L) 06/03/2022   K 4.3 06/03/2022   CL 102 06/03/2022   CO2 15 (L) 06/03/2022  BUN 50 (H) 06/03/2022   CREATININE 2.04 (H) 06/03/2022   GLUCOSE 123 (H) 06/03/2022   GFRNONAA 26 (L) 06/03/2022   GFRAA >60 07/18/2019   CALCIUM 8.3 (L) 06/03/2022   PROT 5.9 (L) 06/03/2022   ALBUMIN 2.3 (L) 06/03/2022   LABGLOB 2.5 12/02/2021   AGRATIO 1.6 12/02/2021   BILITOT 0.4 06/03/2022   ALKPHOS 305 (H) 06/03/2022   AST 29 06/03/2022   ALT 44 06/03/2022   ANIONGAP 11 06/03/2022    CBG (last 3)  No results for input(s): "GLUCAP" in the last 72 hours.    Coagulation Profile: No results for input(s): "INR", "PROTIME" in the last 168 hours.   Radiology Studies: I have personally reviewed the imaging studies  US RENAL  Result Date: 06/02/2022 CLINICAL DATA:  [id number] ARF (acute renal failure) (HCC) [id number] EXAM: RENAL / URINARY TRACT ULTRASOUND COMPLETE COMPARISON:  None Available. FINDINGS: Right Kidney: Renal measurements: 10.8 x 5.1 x 4.7 cm = volume: 134 mL. Echogenicity within normal limits. No mass or hydronephrosis visualized. Left Kidney: Renal measurements: 11.1 x 5.8 x 4.8 cm = volume: 147 mL. Echogenicity within normal limits. No mass or hydronephrosis visualized. Bladder: Appears normal for degree of bladder distention. Other: There are incompletely assessed possible cystic masses of the pelvis. One measures approximately 8 cm while the second measures approximately 6.7 cm. IMPRESSION: 1. No hydronephrosis. 2. There are incompletely assessed possible cystic masses of the pelvis, likely within the bilateral adnexa. Recommend dedicated CT abdomen pelvis for further evaluation. Electronically Signed   By: Meda Klinefelter M.D.   On: 06/02/2022 18:46   DG CHEST PORT 1 VIEW  Result Date: 06/02/2022 CLINICAL DATA:  [id number] Sepsis [facility]) [id number]  EXAM: PORTABLE CHEST - 1 VIEW COMPARISON:  11/23/2021 FINDINGS: Lungs are clear. Heart size upper limits normal as before. No effusion. Visualized bones unremarkable. IMPRESSION: No acute cardiopulmonary disease. Electronically Signed   By: Corlis Leak M.D.   On: 06/02/2022 18:21   DG Tibia/Fibula Right  Result Date: 06/02/2022 CLINICAL DATA:  Lower extremity wound. Evaluate for disc space infection. EXAM: RIGHT TIBIA AND FIBULA - 2 VIEW COMPARISON:  None Available. FINDINGS: There is diffuse right calf subcutaneous fat edema and swelling. Mild navicular-cuneiform joint space narrowing. Tiny plantar calcaneal heel spur. Mild superior patellar degenerative osteophytosis. Tiny joint effusion of the knee. Moderate lateral compartment of the knee joint space narrowing. The tibial and fibular cortices are intact. No acute fracture or dislocation. IMPRESSION: 1. Diffuse right calf subcutaneous fat edema and swelling. No radiographic evidence of osteomyelitis. 2. Mild-to-moderate lateral compartment of the knee osteoarthritis. Electronically Signed   By: Neita Garnet M.D.   On: 06/02/2022 12:52   DG Tibia/Fibula Left  Result Date: 06/02/2022 CLINICAL DATA:  Lower extremity wound. Evaluate for deep space infection. Cellulitis. EXAM: LEFT TIBIA AND FIBULA - 2 VIEW COMPARISON:  Left tibia and fibula radiographs 10/14/2021; CT left tibia and fibula 10/14/2021 FINDINGS: Moderate medial knee compartment and severe patellofemoral compartment joint space narrowing, subchondral sclerosis/cystic change, and peripheral osteophytosis. No joint effusion within the knee. Tiny plantar calcaneal heel spur. Moderate midfoot osteoarthritis. Moderate dorsal midfoot soft tissue swelling. Mild lucency overlying the dorsal navicular and cuneiform bones and lateral view presumably is secondary to overlying subcutaneous fat edema lucencies. The tibia and fibula cortices are intact. No acute fracture dislocation. IMPRESSION: 1. Moderate  dorsal midfoot soft tissue swelling. Mild lucency overlying the dorsal navicular and cuneiform bones and lateral view presumably is secondary to overlying subcutaneous fat edema  lucencies. 2. No CT evidence of osteomyelitis of the left tibia or fibula. 3. Moderate medial knee compartment and severe patellofemoral compartment osteoarthritis. Electronically Signed   By: Neita Garnet M.D.   On: 06/02/2022 12:50       Kaizlee Carlino M.D. [facility] 06/03/2022, 10:57 AM  Available via Epic secure chat 7am-7pm After 7 pm, please refer to night coverage provider listed on amion.

## 2022-06-03 NOTE — TOC Initial Note (Signed)
Transition of Care [facility]) - Initial/Assessment Note    Patient Details  Name: Christine Jacobson MRN: [id number] Date of Birth: [birth year]  Transition of Care [facility]) CM/SW Contact:    Harriet Masson, RN Phone Number: 06/03/2022, 11:19 AM  Clinical Narrative:                  Spoke to patient regarding transition needs. Patient lives alone and has a walker.  Follows at [facility] who provides bus transport. Also takes taxi to other apts. States she has no family support.  Declines home health at this time.  TOC following. Expected Discharge Plan: Home w Home Health Services Barriers to Discharge: Continued Medical Work up   Patient Goals and CMS Choice Patient states their goals for this hospitalization and ongoing recovery are:: return home          Expected Discharge Plan and Services       Living arrangements for the past 2 months: Single Family Home                                      Prior Living Arrangements/Services Living arrangements for the past 2 months: Single Family Home Lives with:: Self Patient language and need for interpreter reviewed:: Yes Do you feel safe going back to the place where you live?: Yes      Need for Family Participation in Patient Care: Yes (Comment) Care giver support system in place?: No (comment) Current home services: DME (walker) Criminal Activity/Legal Involvement Pertinent to Current Situation/Hospitalization: No - Comment as needed  Activities of Daily Living Home Assistive Devices/Equipment: Dan Humphreys (specify type) (front wheeled) ADL Screening (condition at time of admission) Patient's cognitive ability adequate to safely complete daily activities?: Yes Is the patient deaf or have difficulty hearing?: No Does the patient have difficulty seeing, even when wearing glasses/contacts?: No Does the patient have difficulty concentrating, remembering, or making decisions?: No Patient able to express need for  assistance with ADLs?: Yes Does the patient have difficulty dressing or bathing?: No Independently performs ADLs?: Yes (appropriate for developmental age) Does the patient have difficulty walking or climbing stairs?: No Weakness of Legs: None Weakness of Arms/Hands: None  Permission Sought/Granted                  Emotional Assessment Appearance:: Appears stated age Attitude/Demeanor/Rapport: Engaged Affect (typically observed): Accepting Orientation: : Oriented to Self, Oriented to Place, Oriented to  Time, Oriented to Situation Alcohol / Substance Use: Not Applicable Psych Involvement: No (comment)  Admission diagnosis:  Cellulitis [L03.90] Cellulitis of left lower extremity [L03.116] AKI (acute kidney injury) (HCC) [N17.9] Patient Active Problem List   Diagnosis Date Noted   Cellulitis 06/02/2022   Sepsis due to cellulitis (HCC) 06/02/2022   ARF (acute renal failure) (HCC) 06/02/2022   Hyponatremia 06/02/2022   Dehydration 06/02/2022   Metabolic acidosis 06/02/2022   Other secondary pulmonary hypertension (HCC)    Cellulitis and abscess of left lower extremity 10/13/2021   Persistent atrial fibrillation (HCC) 07/28/2021   Obesity    Liver function test abnormality    Primary hypertension    Chronic diastolic heart failure (HCC) 12/15/2016   HOCM (hypertrophic obstructive cardiomyopathy) (HCC) 01/22/2016   Lower extremity edema 09/17/2014   History of syncope 12/05/2013   Pneumonia 11/23/2012   Shortness of breath 07/11/2011   Hypertension 11/23/2010   Chest pain 10/18/2010   Murmur 10/18/2010  Palpitations 09/23/2010   Mild hyperlipidemia    Elevated BP    Hyperprolactinemia (HCC)    Pyoderma gangrenosa    PCP:  Ceasar Lund, PA Pharmacy:   [facility] [id number] - [address] [place] - [address] Noelle Penner [place] [address] Phone: [phone] Fax: [phone]     Social Determinants of Health (SDOH) Social  History: SDOH Screenings   Food Insecurity: Food Insecurity Present (06/02/2022)  Housing: Low Risk  (06/02/2022)  Transportation Needs: No Transportation Needs (06/02/2022)  Utilities: Not At Risk (06/02/2022)  Depression (PHQ2-9): Low Risk  (10/26/2021)  Financial Resource Strain: Low Risk  (12/03/2021)  Tobacco Use: Medium Risk (06/02/2022)   SDOH Interventions:     Readmission Risk Interventions     No data to display

## 2022-06-03 NOTE — Consult Note (Addendum)
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; [facility] 12/20[facility] but states she did not like that they kept telling her she needed to come back every week; patient also has a history of pyoderma gangrenosum  Reason for Consult: bilateral lower leg wounds  Wound type: likely venous in origin  Pressure Injury POA:  Measurement: patient with scattered areas of partial and full thickness skin loss to bilateral lower legs   L lateral and posterior leg 15 cm x 20 cm x 0.1 cm total area of full thickness skin loss 50% pink and moist, 50% yellow slough  R medial leg 8 cm x 8 cm partial thickness skin loss 100% pink and moist  R posterior leg 12 cm x 4 cm full thickness skin loss 75% yellow slough 25% pink and moist   Drainage (amount, consistency, odor) moderate amount of serosanguinous from bilateral legs  Periwound: both legs erythematous, edematous, patient complains of severe itching. Patient is being treated for cellulitis  Dressing procedure/placement/frequency:  Clean bilateral legs with NS, apply Triamcinolone cream daily to intact skin to lower legs, cut Calcium Alginate [place] [phone]) to fit wound beds, cover alginate with Telfa non-stick dressing, ABD pads and Kerlix roll gauze.  Kerlix should be wrapped starting just above toes and ending just below knees.  Ace bandage [place] [phone]) would be beneficial wrapped in same way as Kerlix gauze to help secure dressing and apply some mild compression.    I did discuss with patient that these wounds are likely venous and due to their chronic nature need to be followed by a provider as an outpatient. We did discuss if she did not like the provider at the wound center a referral could possibly be made to vascular to manage these wounds. She would benefit from ABI studies.  I told her wounds like these are most often managed with compression but would need to assess blood flow  status first.  We also discussed that care for these wounds will be ongoing.    WOC team will not follow patient at this time.  Re-consult if further needs arise.   Thank you,    Priscella Mann MSN, RN-BC, [employer] [phone]

## 2022-06-03 NOTE — Evaluation (Signed)
Occupational Therapy Evaluation Patient Details Name: Christine Jacobson MRN: [id number] DOB: 1955-10-08 Today's Date: 06/03/2022   History of Present Illness 67 y.o. female admitted on 06/02/2022 from PCP office with L LE wound infection.  Presented with bilateral leg edema with left lower leg weeping.  PMH: A-fib on Eliquis, HOCM, CHF   Clinical Impression   PT admitted with BIL LE sepsis cellulitis and acute renal failure. Pt currently with functional limitiations due to the deficits listed below (see OT problem list). Pt lives alone and indep with all adls. Pt reports needing to use public transportation options due to fear driving now. Pt currently demonstrates decreased activity tolerance and balance. Pt agreeable to [facility] after education regarding purpose of therapy is to maximize indep  and return to adls. Pt will benefit from skilled OT to increase their independence and safety with adls and balance to allow discharge HHOT.       Recommendations for follow up therapy are one component of a multi-disciplinary discharge planning process, led by the attending physician.  Recommendations may be updated based on patient status, additional functional criteria and insurance authorization.   Assistance Recommended at Discharge Set up Supervision/Assistance  Patient can return home with the following A little help with walking and/or transfers;A little help with bathing/dressing/bathroom;Assist for transportation    Functional Status Assessment  Patient has had a recent decline in their functional status and demonstrates the ability to make significant improvements in function in a reasonable and predictable amount of time.  Equipment Recommendations  BSC/3in1    Recommendations for Other Services       Precautions / Restrictions Precautions Precautions: Fall Precaution Comments: no falls in last 6 months      Mobility Bed Mobility               General bed mobility comments: oob  at this time in recliner    Transfers Overall transfer level: Needs assistance Equipment used: Rolling walker (2 wheels) Transfers: Sit to/from Stand Sit to Stand: Min guard           General transfer comment: cues for hand placement and close blocking for power up . pt increased time but able to complete transfer      Balance Overall balance assessment: Needs assistance Sitting-balance support: Bilateral upper extremity supported, Feet supported Sitting balance-Leahy Scale: Fair     Standing balance support: Bilateral upper extremity supported, During functional activity, Reliant on assistive device for balance Standing balance-Leahy Scale: Fair                             ADL either performed or assessed with clinical judgement   ADL Overall ADL's : Needs assistance/impaired Eating/Feeding: Independent   Grooming: Wash/dry hands;Wash/dry face;Oral care;Supervision/safety;Sitting   Upper Body Bathing: Supervision/ safety;Sitting   Lower Body Bathing: Moderate assistance;Sit to/from stand   Upper Body Dressing : Supervision/safety;Sitting   Lower Body Dressing: Moderate assistance;Sit to/from stand Lower Body Dressing Details (indicate cue type and reason): pt educated on reduction of flexion and use of reacher. pt reports but it makes my back feel better to bend. Pt educated on EC benefit and helping with her SOB. Toilet Transfer: Minimal assistance;Rolling walker (2 wheels) Toilet Transfer Details (indicate cue type and reason): guarding for RW and hand placement           General ADL Comments: pt completed chair level exercises and education on energy conservation with handout provided  Vision Patient Visual Report: No change from baseline       Perception     Praxis      Pertinent Vitals/Pain Pain Assessment Pain Assessment: 0-10 Pain Score: 2  Pain Location: B LE Pain Descriptors / Indicators: Sore Pain Intervention(s): Monitored  during session, Limited activity within patient's tolerance, Premedicated before session, Repositioned     Hand Dominance Right   Extremity/Trunk Assessment Upper Extremity Assessment Upper Extremity Assessment: Overall WFL for tasks assessed   Lower Extremity Assessment Lower Extremity Assessment: Defer to PT evaluation;RLE deficits/detail;LLE deficits/detail RLE Deficits / Details: LE wrapped due to wound LLE Deficits / Details: LE wrapped due to wound   Cervical / Trunk Assessment Cervical / Trunk Assessment: Kyphotic   Communication Communication Communication: No difficulties   Cognition Arousal/Alertness: Awake/alert Behavior During Therapy: WFL for tasks assessed/performed Overall Cognitive Status: Within Functional Limits for tasks assessed                                 General Comments: pt reports that emotional impact over this hospitalization and recent events. pt reports recent break up and feeling he will not ever way to see her again after learning she is sick. Pt expressed just "i didn t know i was to this level" Pt also states "you have to push me. I can be stubborn and you just have to push me to do it. I know i should do it but sometimes i just need that extra encouragement"     General Comments  VSS on RA. pt reports that she feels like her heart is beating fast and noted to be HR91 on monitor. pt asked if she feels its beating fast or anxiety with movement. pt states maybe both    Exercises Exercises: General Lower Extremity General Exercises - Lower Extremity Quad Sets: AROM, Both, 20 reps, Seated Gluteal Sets: AROM, Both, 10 reps, Standing   Shoulder Instructions      Home Living Family/patient expects to be discharged to:: Private residence Living Arrangements: Alone Available Help at Discharge: Other (Comment) Type of Home: Apartment Home Access: Ramped entrance     Home Layout: One level     Bathroom Shower/Tub: Sponge bathes  at baseline   Bathroom Toilet: Standard Bathroom Accessibility: Yes   Home Equipment: Agricultural consultant (2 wheels)          Prior Functioning/Environment Prior Level of Function : Independent/Modified Independent             Mobility Comments: use RW when she first gets up, sometimes goes without ADLs Comments: grocery shops through [employer], bird bathes at baseline, independent with ADLs and iADLs, kitchen and laundry are communal        OT Problem List: Decreased strength;Decreased activity tolerance;Impaired balance (sitting and/or standing);Decreased safety awareness;Decreased knowledge of use of DME or AE;Decreased knowledge of precautions;Cardiopulmonary status limiting activity;Obesity;Pain      OT Treatment/Interventions: Self-care/ADL training;Therapeutic exercise;Energy conservation;DME and/or AE instruction;Manual therapy;Modalities;Therapeutic activities;Patient/family education;Balance training    OT Goals(Current goals can be found in the care plan section) Acute Rehab OT Goals Patient Stated Goal: to get stronger to take care of myself OT Goal Formulation: With patient Time For Goal Achievement: 06/17/22 Potential to Achieve Goals: Good  OT Frequency: Min 2X/week    Co-evaluation              AM-PAC OT "6 Clicks" Daily Activity     Outcome Measure  Help from another person eating meals?: None Help from another person taking care of personal grooming?: A Little Help from another person toileting, which includes using toliet, bedpan, or urinal?: A Little Help from another person bathing (including washing, rinsing, drying)?: A Little Help from another person to put on and taking off regular upper body clothing?: A Little Help from another person to put on and taking off regular lower body clothing?: A Little 6 Click Score: 19   End of Session Equipment Utilized During Treatment: Rolling walker (2 wheels) Nurse Communication: Precautions;Mobility  status  Activity Tolerance: Patient tolerated treatment well Patient left: in chair;with call bell/phone within reach;with chair alarm set  OT Visit Diagnosis: Unsteadiness on feet (R26.81);Muscle weakness (generalized) (M62.81)                Time: 1308-6578 OT Time Calculation (min): 29 min Charges:  OT General Charges $OT Visit: 1 Visit OT Evaluation $OT Eval Moderate Complexity: 1 Mod OT Treatments $Self Care/Home Management : 8-22 mins   Brynn, OTR/L  Acute Rehabilitation Services Office: [phone] .   Mateo Flow 06/03/2022, 3:42 PM

## 2022-06-04 DIAGNOSIS — L03116 Cellulitis of left lower limb: Secondary | ICD-10-CM | POA: Diagnosis not present

## 2022-06-04 DIAGNOSIS — L0390 Cellulitis, unspecified: Secondary | ICD-10-CM | POA: Diagnosis not present

## 2022-06-04 DIAGNOSIS — N179 Acute kidney failure, unspecified: Secondary | ICD-10-CM | POA: Diagnosis not present

## 2022-06-04 DIAGNOSIS — I5032 Chronic diastolic (congestive) heart failure: Secondary | ICD-10-CM | POA: Diagnosis not present

## 2022-06-04 LAB — URINE CULTURE

## 2022-06-04 LAB — CBC
HCT: 33.7 % — ABNORMAL LOW (ref 36.0–46.0)
Hemoglobin: 11.3 g/dL — ABNORMAL LOW (ref 12.0–15.0)
MCH: 31.3 pg (ref 26.0–34.0)
MCHC: 33.5 g/dL (ref 30.0–36.0)
MCV: 93.4 fL (ref 80.0–100.0)
Platelets: 279 10*3/uL (ref 150–400)
RBC: 3.61 MIL/uL — ABNORMAL LOW (ref 3.87–5.11)
RDW: 15.1 % (ref 11.5–15.5)
WBC: 9.4 10*3/uL (ref 4.0–10.5)
nRBC: 0 % (ref 0.0–0.2)

## 2022-06-04 LAB — BASIC METABOLIC PANEL
Anion gap: 8 (ref 5–15)
BUN: 42 mg/dL — ABNORMAL HIGH (ref 8–23)
CO2: 23 mmol/L (ref 22–32)
Calcium: 9.3 mg/dL (ref 8.9–10.3)
Chloride: 100 mmol/L (ref 98–111)
Creatinine, Ser: 1.61 mg/dL — ABNORMAL HIGH (ref 0.44–1.00)
GFR, Estimated: 34 mL/min — ABNORMAL LOW (ref >60)
Glucose, Bld: 101 mg/dL — ABNORMAL HIGH (ref 70–99)
Potassium: 4.8 mmol/L (ref 3.5–5.1)
Sodium: 131 mmol/L — ABNORMAL LOW (ref 135–145)

## 2022-06-04 LAB — GLUCOSE, CAPILLARY: Glucose-Capillary: 89 mg/dL (ref 70–99)

## 2022-06-04 MED ORDER — SODIUM CHLORIDE 0.9 % IV SOLN
2.0000 g | Freq: Two times a day (BID) | INTRAVENOUS | Status: DC
  Administered 2022-06-04 – 2022-06-08 (×8): 2 g via INTRAVENOUS
  Filled 2022-06-04 (×8): qty 12.5

## 2022-06-04 MED ORDER — MELATONIN 3 MG PO TABS
3.0000 mg | ORAL_TABLET | Freq: Every day | ORAL | Status: DC
  Administered 2022-06-04 – 2022-06-08 (×5): 3 mg via ORAL
  Filled 2022-06-04 (×5): qty 1

## 2022-06-04 MED ORDER — LORAZEPAM 0.5 MG PO TABS
0.5000 mg | ORAL_TABLET | Freq: Three times a day (TID) | ORAL | Status: DC | PRN
  Administered 2022-06-04 – 2022-06-05 (×4): 0.5 mg via ORAL
  Filled 2022-06-04 (×4): qty 1

## 2022-06-04 MED ORDER — FUROSEMIDE 10 MG/ML IJ SOLN
20.0000 mg | Freq: Every day | INTRAMUSCULAR | Status: DC
  Administered 2022-06-05: 20 mg via INTRAVENOUS
  Filled 2022-06-04 (×2): qty 2

## 2022-06-04 NOTE — Progress Notes (Signed)
When I entered room with dressing supplies, patient reports that the physician told her she/he would like to see patient's legs during round this a.m. prior to them being redressed.  Will alert day shift RN to plan.

## 2022-06-04 NOTE — Progress Notes (Signed)
Pharmacy Antibiotic Note  Christine Jacobson is a 67 y.o. female admitted on 06/02/2022 with  wound infection .  Pharmacy has been consulted for vancomycin and cefepime dosing.  Patient presented with bilateral leg edema with left lower leg weeping. WBC Currently with significant AKI, Scr 2.16 > 1.61 - baseline 0.91.  Plan: Vancomycin 1g q 48 hrs for now - watch renal fx may need to increase soon, but scheduled for a dose this evening. Increase cefepime to 2g q 12 hrs. F/u signs and symptoms of infection to alter antibiotic selection and duration  Height: 5\' 4"  (162.6 cm) Weight: 89.2 kg (196 lb 10.4 oz) IBW/kg (Calculated) : 54.7  Temp (24hrs), Avg:97.9 F (36.6 C), Min:97.7 F (36.5 C), Max:98.7 F (37.1 C)  Recent Labs  Lab 06/02/22 1123 06/02/22 1142 06/03/22 0028 06/04/22 0025  WBC 12.3*  --  12.9* 9.4  CREATININE 2.16*  --  2.04* 1.61*  LATICACIDVEN  --  2.2* 1.4  --      Estimated Creatinine Clearance: 35.7 mL/min (A) (by C-G formula based on SCr of 1.61 mg/dL (H)).    Allergies  Allergen Reactions   Beef-Derived Products    Chicken Protein    Fish-Derived Products    Pork-Derived Products     Antimicrobials this admission: Vancomycin 5/16 >>   Cefepime 5/16 >>    Microbiology results: pending  Thank you for involving pharmacy in this patient's care.  Reece Leader, Colon Flattery, BCCP Clinical Pharmacist  06/04/2022 3:26 PM   [facility] pharmacy phone numbers are listed on [email or web address]

## 2022-06-04 NOTE — Progress Notes (Signed)
[facility]                                                                              Christine Jacobson, is a 67 y.o. female, DOB - [birth year], ZOX:[id number] Admit date - 06/02/2022    Outpatient Primary MD for the patient is Turmel, Caleb P, PA  LOS - 2  days  No chief complaint on file.      Brief summary   Patient is a 67 year old female HTN, hyperlipidemia, atrial fibrillation on Eliquis, HOCM chronic diastolic CHF presented to ED from the PCPs office with lower extremity wounds.  Patient reported that wounds have been managed at the wound care center for the last several months.  Currently she was self treating them by using topical Neosporin and washing them.  She was seen at her PCPs office and it was felt that the wounds were looking worse.  Patient was also noted to have low BP and hence sent to ED due to concern for sepsis.  Patient did report some lightheadedness, chills, intermittent shortness of breath off and on for about a week In the ED, BP 106/64, down to 90/57, sats 98% on room air, pulse 82, afebrile. CBC with white count 12.3 with left shift, lactic acid 2.2. X-ray tib-fib showed moderate dorsal midfoot soft tissue swelling  Assessment & Plan    Principal Problem: Severe sepsis due to cellulitis (HCC), bilateral lower extremity, POA -Presented with hypotension, bilateral lower extremity cellulitis, acute kidney injury, lactic acidosis -Blood cultures so far negative, urine cultures with insignificant growth  -Continue IV vancomycin, cefepime -Obtain ABIs -Wound care consulted -Wounds evaluated today, will also place on IV Lasix to help with edema   Acute kidney injury, metabolic acidosis -Creatinine 8.11 on admission, baseline 0.8-0.9 -Follow urine culture, blood cultures -Renal ultrasound showed no hydronephrosis.  Incompletely assessed possible cystic masses of the pelvis likely within the bilateral adnexa, recommended CT abdomen  pelvis -Continue to hold torsemide, creatinine improving 1.61, significant edema lower extremities, placed on Lasix  Paroxysmal A-fib -Currently NSR, BP stable -Continue Toprol-XL, Cardizem currently held -Continue eliquis     Hyponatremia -Sodium 129 on admission,  -Was placed on IV fluids with sodium bicarb, now improving, sodium 131 -Urine osmolality 556, UNA <10, serum osmolality 295, IV fluids discontinued -DC ibuprofen, Jardiance, torsemide   Chronic diastolic CHF/HOCM -Continue decreased dose of Toprol-XL 25 mg daily -Significant edema lower extremities, placed on IV Lasix  Hyperlipidemia -Lipid panel showed triglycerides 172, LDL 146, cholesterol 228 -Placed on Crestor 20 mg daily  Obesity Estimated body mass index is 33.76 kg/m as calculated from the following:   Height as of this encounter: 5\' 4"  (1.626 m).   Weight as of this encounter: 89.2 kg.  Code Status: DNR DVT Prophylaxis:   apixaban (ELIQUIS) tablet 5 mg   Level of Care: Level of care: Progressive Family Communication: Updated patient Disposition Plan:      Remains inpatient appropriate:      Procedures:    Consultants:     Antimicrobials:   Anti-infectives (From admission, onward)    Start     Dose/Rate Route Frequency Ordered Stop  06/04/22 1700  vancomycin (VANCOCIN) IVPB 1000 mg/200 mL premix        1,000 mg 200 mL/hr over 60 Minutes Intravenous Every 48 hours 06/03/22 0813     06/02/22 1744  vancomycin variable dose per unstable renal function (pharmacist dosing)  Status:  Discontinued         Does not apply See admin instructions 06/02/22 1744 06/03/22 0812   06/02/22 1730  ceFEPIme (MAXIPIME) 2 g in sodium chloride 0.9 % 100 mL IVPB        2 g 200 mL/hr over 30 Minutes Intravenous Every 24 hours 06/02/22 1723     06/02/22 1730  vancomycin (VANCOREADY) IVPB 1500 mg/300 mL        1,500 mg 150 mL/hr over 120 Minutes Intravenous  Once 06/02/22 1730 06/03/22 1539   06/02/22 1345   vancomycin (VANCOCIN) IVPB 1000 mg/200 mL premix        1,000 mg 200 mL/hr over 60 Minutes Intravenous  Once 06/02/22 1344 06/02/22 1533          Medications  apixaban  5 mg Oral BID   furosemide  20 mg Intravenous Daily   melatonin  3 mg Oral QHS   metoprolol succinate  25 mg Oral Daily   pneumococcal 20-valent conjugate vaccine  0.5 mL Intramuscular Tomorrow-1000   rosuvastatin  20 mg Oral Daily   sertraline  50 mg Oral QHS   sodium chloride flush  3 mL Intravenous Q12H   triamcinolone cream   Topical Daily      Subjective:   Christine Jacobson was seen and examined today.  Lower extremity wounds with significant redness, peeling and edema.  No chest pain or shortness of breath.   Objective:   Vitals:   06/04/22 0222 06/04/22 0336 06/04/22 0725 06/04/22 1119  BP: 115/85  105/81 110/73  Pulse:   94 88  Resp:   15   Temp: 97.8 F (36.6 C)  98.7 F (37.1 C) 97.8 F (36.6 C)  TempSrc: Oral  Oral Oral  SpO2:   95%   Weight:  89.2 kg    Height:        Intake/Output Summary (Last 24 hours) at 06/04/2022 1208 Last data filed at 06/04/2022 4098 Gross per 24 hour  Intake 480 ml  Output 0 ml  Net 480 ml     Wt Readings from Last 3 Encounters:  06/04/22 89.2 kg  02/10/22 99.5 kg  02/01/22 86.2 kg    Physical Exam General: Alert and oriented x 3, NAD Cardiovascular: S1 S2 clear, RRR.  Respiratory: CTAB, no wheezing Gastrointestinal: Soft, nontender, nondistended, NBS Ext: 1-2+ edema bilaterally Neuro: no new deficits Skin: See pictures below, significant redness, peeling and edema, left worse than right LE Psych: Normal affect           Data Reviewed:  I have personally reviewed following labs    CBC Lab Results  Component Value Date   WBC 9.4 06/04/2022   RBC 3.61 (L) 06/04/2022   HGB 11.3 (L) 06/04/2022   HCT 33.7 (L) 06/04/2022   MCV 93.4 06/04/2022   MCH 31.3 06/04/2022   PLT 279 06/04/2022   MCHC 33.5 06/04/2022   RDW 15.1 06/04/2022    LYMPHSABS 0.7 06/03/2022   MONOABS 1.2 (H) 06/03/2022   EOSABS 0.1 06/03/2022   BASOSABS 0.1 06/03/2022     Last metabolic panel Lab Results  Component Value Date   NA 131 (L) 06/04/2022   K 4.8 06/04/2022   CL  100 06/04/2022   CO2 23 06/04/2022   BUN 42 (H) 06/04/2022   CREATININE 1.61 (H) 06/04/2022   GLUCOSE 101 (H) 06/04/2022   GFRNONAA 34 (L) 06/04/2022   GFRAA >60 07/18/2019   CALCIUM 9.3 06/04/2022   PROT 5.9 (L) 06/03/2022   ALBUMIN 2.3 (L) 06/03/2022   LABGLOB 2.5 12/02/2021   AGRATIO 1.6 12/02/2021   BILITOT 0.4 06/03/2022   ALKPHOS 305 (H) 06/03/2022   AST 29 06/03/2022   ALT 44 06/03/2022   ANIONGAP 8 06/04/2022    CBG (last 3)  Recent Labs    06/04/22 0559  GLUCAP 89      Coagulation Profile: No results for input(s): "INR", "PROTIME" in the last 168 hours.   Radiology Studies: I have personally reviewed the imaging studies  US RENAL  Result Date: 06/02/2022 CLINICAL DATA:  [id number] ARF (acute renal failure) (HCC) [id number] EXAM: RENAL / URINARY TRACT ULTRASOUND COMPLETE COMPARISON:  None Available. FINDINGS: Right Kidney: Renal measurements: 10.8 x 5.1 x 4.7 cm = volume: 134 mL. Echogenicity within normal limits. No mass or hydronephrosis visualized. Left Kidney: Renal measurements: 11.1 x 5.8 x 4.8 cm = volume: 147 mL. Echogenicity within normal limits. No mass or hydronephrosis visualized. Bladder: Appears normal for degree of bladder distention. Other: There are incompletely assessed possible cystic masses of the pelvis. One measures approximately 8 cm while the second measures approximately 6.7 cm. IMPRESSION: 1. No hydronephrosis. 2. There are incompletely assessed possible cystic masses of the pelvis, likely within the bilateral adnexa. Recommend dedicated CT abdomen pelvis for further evaluation. Electronically Signed   By: Meda Klinefelter M.D.   On: 06/02/2022 18:46   DG CHEST PORT 1 VIEW  Result Date: 06/02/2022 CLINICAL DATA:  [id number] Sepsis  [facility]) [id number] EXAM: PORTABLE CHEST - 1 VIEW COMPARISON:  11/23/2021 FINDINGS: Lungs are clear. Heart size upper limits normal as before. No effusion. Visualized bones unremarkable. IMPRESSION: No acute cardiopulmonary disease. Electronically Signed   By: Corlis Leak M.D.   On: 06/02/2022 18:21   DG Tibia/Fibula Right  Result Date: 06/02/2022 CLINICAL DATA:  Lower extremity wound. Evaluate for disc space infection. EXAM: RIGHT TIBIA AND FIBULA - 2 VIEW COMPARISON:  None Available. FINDINGS: There is diffuse right calf subcutaneous fat edema and swelling. Mild navicular-cuneiform joint space narrowing. Tiny plantar calcaneal heel spur. Mild superior patellar degenerative osteophytosis. Tiny joint effusion of the knee. Moderate lateral compartment of the knee joint space narrowing. The tibial and fibular cortices are intact. No acute fracture or dislocation. IMPRESSION: 1. Diffuse right calf subcutaneous fat edema and swelling. No radiographic evidence of osteomyelitis. 2. Mild-to-moderate lateral compartment of the knee osteoarthritis. Electronically Signed   By: Neita Garnet M.D.   On: 06/02/2022 12:52   DG Tibia/Fibula Left  Result Date: 06/02/2022 CLINICAL DATA:  Lower extremity wound. Evaluate for deep space infection. Cellulitis. EXAM: LEFT TIBIA AND FIBULA - 2 VIEW COMPARISON:  Left tibia and fibula radiographs 10/14/2021; CT left tibia and fibula 10/14/2021 FINDINGS: Moderate medial knee compartment and severe patellofemoral compartment joint space narrowing, subchondral sclerosis/cystic change, and peripheral osteophytosis. No joint effusion within the knee. Tiny plantar calcaneal heel spur. Moderate midfoot osteoarthritis. Moderate dorsal midfoot soft tissue swelling. Mild lucency overlying the dorsal navicular and cuneiform bones and lateral view presumably is secondary to overlying subcutaneous fat edema lucencies. The tibia and fibula cortices are intact. No acute fracture dislocation. IMPRESSION:  1. Moderate dorsal midfoot soft tissue swelling. Mild lucency overlying the dorsal navicular and cuneiform bones and  lateral view presumably is secondary to overlying subcutaneous fat edema lucencies. 2. No CT evidence of osteomyelitis of the left tibia or fibula. 3. Moderate medial knee compartment and severe patellofemoral compartment osteoarthritis. Electronically Signed   By: Neita Garnet M.D.   On: 06/02/2022 12:50       Mercy Leppla M.D. [facility] 06/04/2022, 12:08 PM  Available via Epic secure chat 7am-7pm After 7 pm, please refer to night coverage provider listed on amion.

## 2022-06-04 NOTE — Progress Notes (Signed)
Mobility Specialist: Progress Note   06/04/22 1414  Mobility  Activity Ambulated with assistance in hallway  Level of Assistance Contact guard assist, steadying assist  Assistive Device Front wheel walker  Distance Ambulated (ft) 100 ft  Activity Response Tolerated fair  Mobility Referral Yes  $Mobility charge 1 Mobility  Mobility Specialist Start Time (ACUTE ONLY) 1353  Mobility Specialist Stop Time (ACUTE ONLY) 1411  Mobility Specialist Time Calculation (min) (ACUTE ONLY) 18 min   Pre-Mobility: 91 HR, 95% SpO2 Post-Mobility: 88 HR, 96% SpO2  Pt received in the chair and agreeable to mobility. Mod I to stand and contact guard during ambulation. Stopped x2 for standing breaks secondary to c/o nausea with pt retching throughout session. No c/o pain, SOB, or dizziness. Pt back to the chair after session with call bell and phone in reach.   Brynlyn Dade Mobility Specialist Please contact via SecureChat or Rehab office at [phone]

## 2022-06-05 DIAGNOSIS — N179 Acute kidney failure, unspecified: Secondary | ICD-10-CM | POA: Diagnosis not present

## 2022-06-05 DIAGNOSIS — I5032 Chronic diastolic (congestive) heart failure: Secondary | ICD-10-CM | POA: Diagnosis not present

## 2022-06-05 DIAGNOSIS — L0390 Cellulitis, unspecified: Secondary | ICD-10-CM | POA: Diagnosis not present

## 2022-06-05 DIAGNOSIS — L03116 Cellulitis of left lower limb: Secondary | ICD-10-CM | POA: Diagnosis not present

## 2022-06-05 LAB — CBC
HCT: 32.5 % — ABNORMAL LOW (ref 36.0–46.0)
Hemoglobin: 10.9 g/dL — ABNORMAL LOW (ref 12.0–15.0)
MCH: 31.2 pg (ref 26.0–34.0)
MCHC: 33.5 g/dL (ref 30.0–36.0)
MCV: 93.1 fL (ref 80.0–100.0)
Platelets: 261 10*3/uL (ref 150–400)
RBC: 3.49 MIL/uL — ABNORMAL LOW (ref 3.87–5.11)
RDW: 15.1 % (ref 11.5–15.5)
WBC: 9.6 10*3/uL (ref 4.0–10.5)
nRBC: 0 % (ref 0.0–0.2)

## 2022-06-05 LAB — BASIC METABOLIC PANEL
Anion gap: 10 (ref 5–15)
BUN: 35 mg/dL — ABNORMAL HIGH (ref 8–23)
CO2: 21 mmol/L — ABNORMAL LOW (ref 22–32)
Calcium: 9 mg/dL (ref 8.9–10.3)
Chloride: 100 mmol/L (ref 98–111)
Creatinine, Ser: 1.41 mg/dL — ABNORMAL HIGH (ref 0.44–1.00)
GFR, Estimated: 40 mL/min — ABNORMAL LOW (ref >60)
Glucose, Bld: 115 mg/dL — ABNORMAL HIGH (ref 70–99)
Potassium: 3.4 mmol/L — ABNORMAL LOW (ref 3.5–5.1)
Sodium: 131 mmol/L — ABNORMAL LOW (ref 135–145)

## 2022-06-05 LAB — GLUCOSE, CAPILLARY: Glucose-Capillary: 102 mg/dL — ABNORMAL HIGH (ref 70–99)

## 2022-06-05 MED ORDER — VANCOMYCIN HCL IN DEXTROSE 1-5 GM/200ML-% IV SOLN
1000.0000 mg | INTRAVENOUS | Status: DC
  Administered 2022-06-06: 1000 mg via INTRAVENOUS
  Filled 2022-06-05: qty 200

## 2022-06-05 MED ORDER — HEPARIN (PORCINE) 25000 UT/250ML-% IV SOLN
1150.0000 [IU]/h | INTRAVENOUS | Status: DC
  Administered 2022-06-05 (×2): 950 [IU]/h via INTRAVENOUS
  Administered 2022-06-06: 1100 [IU]/h via INTRAVENOUS
  Administered 2022-06-07: 1150 [IU]/h via INTRAVENOUS
  Filled 2022-06-05 (×4): qty 250

## 2022-06-05 MED ORDER — HEPARIN (PORCINE) 25000 UT/250ML-% IV SOLN: 950.0000 [IU]/h | INTRAVENOUS | Status: DC

## 2022-06-05 NOTE — Progress Notes (Signed)
Wound care: Serosanguinous dried discharge was noted through all layers of Kerlix. Patient with excruciating pain while removing, despite being given Dilaudid for pain prior and Ativan. Even wetting the Alginate, it would not come off without bleeding. Patient had severe bleeding down both back calves.   Lot of blood loss, soaked through two disposable pads and then after soaked through a bed pad. Patient said she had no bleeding with these wounds at home or with her wound care team she had been working with.  I did not follow wound care orders due to the impact and feel like this needs to be readdressed.   I applied one layer of xeroform with abdominal pads to front and back and wrapped in Kerlix. Will pass on to morning team.

## 2022-06-05 NOTE — Progress Notes (Signed)
ANTICOAGULATION CONSULT NOTE - Initial Consult  Pharmacy Consult for IV heparin (holding Eliquis) Indication: atrial fibrillation  Allergies  Allergen Reactions   Beef-Derived Products     Does not eat pork d/t religious reasons, but okay with receiving heparin.  Discussed with patient 06/05/22   Chicken Protein    Fish-Derived Products    Pork-Derived Products     Patient Measurements: Height: 5\' 4"  (162.6 cm) Weight: 89.6 kg (197 lb 8.5 oz) IBW/kg (Calculated) : 54.7   Vital Signs: Temp: 98 F (36.7 C) (05/19 1104) Temp Source: Oral (05/19 1104) BP: 118/64 (05/19 1104) Pulse Rate: 78 (05/19 1104)  Labs: Recent Labs    06/03/22 0028 06/04/22 0025 06/05/22 0017  HGB 10.9* 11.3* 10.9*  HCT 32.7* 33.7* 32.5*  PLT 227 279 261  CREATININE 2.04* 1.61* 1.41*    Estimated Creatinine Clearance: 40.8 mL/min (A) (by C-G formula based on SCr of 1.41 mg/dL (H)).   Medical History: Past Medical History:  Diagnosis Date   Chest pain 11/22/2010   2D STRESS ECHO - EF 60%, peak stress EF 80%, normal, no evidence for stress-induced ischemia   HOCM (hypertrophic obstructive cardiomyopathy) (HCC) 06/21/2011   2D ECHO - EF >55%, normal   HTN (hypertension)    Hyperprolactinemia (HCC)    dx in her 18, took meds, self d/c a while back   Liver function test abnormality    normal when repeated   Mild hyperlipidemia    Obesity    Palpitations    negative stress echo in November 2012 with normal LV function; mild LVH, proximal septal thickening with narrow LVOT and mild gradient; mild MR and TR; Cardionet showed PACs in November 2012   Pyoderma gangrenosa    Shortness of breath 07/11/2011   MET TEST    Medications:  Infusions:   ceFEPime (MAXIPIME) IV 2 g (06/05/22 0522)   heparin     [START ON 06/06/2022] vancomycin      Assessment: 67 yo female on chronic Eliquis for afib, now with bleeding wounds.  Pharmacy asked to hold Eliquis and start IV heparin.  Of note - pt has  allergy in chart to pork derived products.  I discussed with pt, she does not eat pork due to religious reasons, but is not opposed to receiving IV heparin, which is pork derived.  She had 5 mg of Eliquis this morning about 10 AM  Goal of Therapy:  Heparin level 0.3-0.7 units/ml Monitor platelets by anticoagulation protocol: Yes   Plan:  Start IV heparin drip without bolus tonight at 10 pm. Check heparin level 8 hrs after gtt starts. Daily heparin level and CBC.  Reece Leader, Colon Flattery, BCCP Clinical Pharmacist  06/05/2022 2:32 PM   [facility] pharmacy phone numbers are listed on [email or web address]

## 2022-06-05 NOTE — Progress Notes (Signed)
[facility]                                                                              Christine Jacobson, is a 67 y.o. female, DOB - 22-Oct-1955, ZOX:[id number] Admit date - 06/02/2022    Outpatient Primary MD for the patient is Turmel, Caleb P, PA  LOS - 3  days  No chief complaint on file.      Brief summary   Patient is a 67 year old female HTN, hyperlipidemia, atrial fibrillation on Eliquis, HOCM chronic diastolic CHF presented to ED from the PCPs office with lower extremity wounds.  Patient reported that wounds have been managed at the wound care center for the last several months.  Currently she was self treating them by using topical Neosporin and washing them.  She was seen at her PCPs office and it was felt that the wounds were looking worse.  Patient was also noted to have low BP and hence sent to ED due to concern for sepsis.  Patient did report some lightheadedness, chills, intermittent shortness of breath off and on for about a week In the ED, BP 106/64, down to 90/57, sats 98% on room air, pulse 82, afebrile. CBC with white count 12.3 with left shift, lactic acid 2.2. X-ray tib-fib showed moderate dorsal midfoot soft tissue swelling  Assessment & Plan    Principal Problem: Severe sepsis due to cellulitis [facility]), bilateral lower extremity, POA -Presented with hypotension, bilateral lower extremity cellulitis, acute kidney injury, lactic acidosis -Blood cultures so far negative, urine cultures with insignificant growth  -Obtain ABIs, ordered on 5/17, still pending -Continue IV vancomycin, cefepime - wound care following, will request wound care to reevaluate  -Significant bleeding noted from the wounds today, on eliquis, will place on hold   Acute kidney injury, metabolic acidosis -Creatinine 8.11 on admission, baseline 0.8-0.9 -Follow urine culture, blood cultures -Renal ultrasound showed no hydronephrosis.  Incompletely assessed possible cystic masses of  the pelvis likely within the bilateral adnexa, recommended CT abdomen pelvis.  Patient declined CT abdomen. -Creatinine improving, creatinine 1.4, continue IV Lasix   Paroxysmal A-fib -Currently NSR, BP stable -Continue decreased dose of Toprol-XL 25 mg daily.  Cardizem held -Will hold Eliquis due to bleeding from the wounds -Placed on IV heparin until stable     Hyponatremia -Sodium 129 on admission,  -Urine osmolality 556, UNA <10, serum osmolality 295, IV fluids discontinued -DC ibuprofen, Jardiance, torsemide -Sodium stable, 131   Chronic diastolic CHF/HOCM -Continue decreased dose of Toprol-XL 25 mg daily -Continue IV Lasix  Hyperlipidemia -Lipid panel showed triglycerides 172, LDL 146, cholesterol 228 -Continue Crestor  Obesity Estimated body mass index is 33.91 kg/m as calculated from the following:   Height as of this encounter: 5\' 4"  (1.626 m).   Weight as of this encounter: 89.6 kg.  Code Status: DNR DVT Prophylaxis:   apixaban (ELIQUIS) tablet 5 mg   Level of Care: Level of care: Progressive Family Communication: Updated patient Disposition Plan:      Remains inpatient appropriate:      Procedures:    Consultants:     Antimicrobials:   Anti-infectives (From admission, onward)  Start     Dose/Rate Route Frequency Ordered Stop   06/04/22 1700  vancomycin (VANCOCIN) IVPB 1000 mg/200 mL premix        1,000 mg 200 mL/hr over 60 Minutes Intravenous Every 48 hours 06/03/22 0813     06/04/22 1530  ceFEPIme (MAXIPIME) 2 g in sodium chloride 0.9 % 100 mL IVPB        2 g 200 mL/hr over 30 Minutes Intravenous Every 12 hours 06/04/22 1525     06/02/22 1744  vancomycin variable dose per unstable renal function (pharmacist dosing)  Status:  Discontinued         Does not apply See admin instructions 06/02/22 1744 06/03/22 0812   06/02/22 1730  ceFEPIme (MAXIPIME) 2 g in sodium chloride 0.9 % 100 mL IVPB  Status:  Discontinued        2 g 200 mL/hr over 30  Minutes Intravenous Every 24 hours 06/02/22 1723 06/04/22 1525   06/02/22 1730  vancomycin (VANCOREADY) IVPB 1500 mg/300 mL        1,500 mg 150 mL/hr over 120 Minutes Intravenous  Once 06/02/22 1730 06/03/22 1539   06/02/22 1345  vancomycin (VANCOCIN) IVPB 1000 mg/200 mL premix        1,000 mg 200 mL/hr over 60 Minutes Intravenous  Once 06/02/22 1344 06/02/22 1533          Medications  apixaban  5 mg Oral BID   furosemide  20 mg Intravenous Daily   melatonin  3 mg Oral QHS   metoprolol succinate  25 mg Oral Daily   pneumococcal 20-valent conjugate vaccine  0.5 mL Intramuscular Tomorrow-1000   rosuvastatin  20 mg Oral Daily   sertraline  50 mg Oral QHS   sodium chloride flush  3 mL Intravenous Q12H   triamcinolone cream   Topical Daily      Subjective:   Mariesha Bachmeier was seen and examined today.  Lower extremity wounds with significant bleeding noted during the dressing change.  No chest pain or shortness of breath.  Objective:   Vitals:   06/04/22 2240 06/05/22 0521 06/05/22 0733 06/05/22 1104  BP: 96/62 134/82 (!) 75/63 118/64  Pulse:   90 78  Resp: 20  15   Temp: 97.7 F (36.5 C) 97.7 F (36.5 C) 98 F (36.7 C) 98 F (36.7 C)  TempSrc: Oral Oral Oral Oral  SpO2:      Weight:  89.6 kg    Height:        Intake/Output Summary (Last 24 hours) at 06/05/2022 1146 Last data filed at 06/05/2022 0749 Gross per 24 hour  Intake 720 ml  Output --  Net 720 ml     Wt Readings from Last 3 Encounters:  06/05/22 89.6 kg  02/10/22 99.5 kg  02/01/22 86.2 kg   Physical Exam General: Alert and oriented x 3, NAD Cardiovascular: S1 S2 clear, RRR.  Respiratory: CTAB Gastrointestinal: Soft, nontender, nondistended, NBS Ext: + pedal edema bilaterally Skin: Lower extremities dressing intact Psych: Normal affect    Data Reviewed:  I have personally reviewed following labs    CBC Lab Results  Component Value Date   WBC 9.6 06/05/2022   RBC 3.49 (L) 06/05/2022    HGB 10.9 (L) 06/05/2022   HCT 32.5 (L) 06/05/2022   MCV 93.1 06/05/2022   MCH 31.2 06/05/2022   PLT 261 06/05/2022   MCHC 33.5 06/05/2022   RDW 15.1 06/05/2022   LYMPHSABS 0.7 06/03/2022   MONOABS 1.2 (H) 06/03/2022  EOSABS 0.1 06/03/2022   BASOSABS 0.1 06/03/2022     Last metabolic panel Lab Results  Component Value Date   NA 131 (L) 06/05/2022   K 3.4 (L) 06/05/2022   CL 100 06/05/2022   CO2 21 (L) 06/05/2022   BUN 35 (H) 06/05/2022   CREATININE 1.41 (H) 06/05/2022   GLUCOSE 115 (H) 06/05/2022   GFRNONAA 40 (L) 06/05/2022   GFRAA >60 07/18/2019   CALCIUM 9.0 06/05/2022   PROT 5.9 (L) 06/03/2022   ALBUMIN 2.3 (L) 06/03/2022   LABGLOB 2.5 12/02/2021   AGRATIO 1.6 12/02/2021   BILITOT 0.4 06/03/2022   ALKPHOS 305 (H) 06/03/2022   AST 29 06/03/2022   ALT 44 06/03/2022   ANIONGAP 10 06/05/2022    CBG (last 3)  Recent Labs    06/04/22 0559 06/05/22 0518  GLUCAP 89 102*      Coagulation Profile: No results for input(s): "INR", "PROTIME" in the last 168 hours.   Radiology Studies: I have personally reviewed the imaging studies  No results found.     Thad Ranger M.D. [facility] 06/05/2022, 11:46 AM  Available via Epic secure chat 7am-7pm After 7 pm, please refer to night coverage provider listed on amion.

## 2022-06-05 NOTE — Progress Notes (Signed)
Pharmacy Antibiotic Note  Christine Jacobson is a 67 y.o. female admitted on 06/02/2022 with  wound infection .  Pharmacy has been consulted for vancomycin and cefepime dosing.  Patient presented with bilateral leg edema with left lower leg weeping. WBC Currently with significant AKI, Scr 2.16 > 1.61 - baseline 0.91.  Plan: Vancomycin increase to 1g q 36 hrs with improving renal function. Increase cefepime to 2g q 12 hrs. F/u signs and symptoms of infection to alter antibiotic selection and duration  Height: 5\' 4"  (162.6 cm) Weight: 89.6 kg (197 lb 8.5 oz) IBW/kg (Calculated) : 54.7  Temp (24hrs), Avg:97.8 F (36.6 C), Min:97.7 F (36.5 C), Max:98 F (36.7 C)  Recent Labs  Lab 06/02/22 1123 06/02/22 1142 06/03/22 0028 06/04/22 0025 06/05/22 0017  WBC 12.3*  --  12.9* 9.4 9.6  CREATININE 2.16*  --  2.04* 1.61* 1.41*  LATICACIDVEN  --  2.2* 1.4  --   --      Estimated Creatinine Clearance: 40.8 mL/min (A) (by C-G formula based on SCr of 1.41 mg/dL (H)).    Allergies  Allergen Reactions   Beef-Derived Products     Does not eat pork d/t religious reasons, but okay with receiving heparin.  Discussed with patient 06/05/22   Chicken Protein    Fish-Derived Products    Pork-Derived Products     Antimicrobials this admission: Vancomycin 5/16 >>   Cefepime 5/16 >>    Microbiology results: 5/17 UCx > 5/16 BCx  >   Thank you for involving pharmacy in this patient's care.  Reece Leader, Colon Flattery, BCCP Clinical Pharmacist  06/05/2022 2:26 PM   [facility] pharmacy phone numbers are listed on [email or web address]

## 2022-06-06 ENCOUNTER — Inpatient Hospital Stay (HOSPITAL_COMMUNITY): Payer: Medicare Other

## 2022-06-06 ENCOUNTER — Encounter (HOSPITAL_COMMUNITY): Payer: Medicare Other

## 2022-06-06 DIAGNOSIS — L0390 Cellulitis, unspecified: Secondary | ICD-10-CM | POA: Diagnosis not present

## 2022-06-06 DIAGNOSIS — L03116 Cellulitis of left lower limb: Secondary | ICD-10-CM | POA: Diagnosis not present

## 2022-06-06 DIAGNOSIS — N179 Acute kidney failure, unspecified: Secondary | ICD-10-CM | POA: Diagnosis not present

## 2022-06-06 DIAGNOSIS — A419 Sepsis, unspecified organism: Secondary | ICD-10-CM | POA: Diagnosis not present

## 2022-06-06 LAB — BASIC METABOLIC PANEL
Anion gap: 10 (ref 5–15)
BUN: 30 mg/dL — ABNORMAL HIGH (ref 8–23)
CO2: 21 mmol/L — ABNORMAL LOW (ref 22–32)
Calcium: 9.3 mg/dL (ref 8.9–10.3)
Chloride: 103 mmol/L (ref 98–111)
Creatinine, Ser: 1.29 mg/dL — ABNORMAL HIGH (ref 0.44–1.00)
GFR, Estimated: 45 mL/min — ABNORMAL LOW (ref >60)
Glucose, Bld: 167 mg/dL — ABNORMAL HIGH (ref 70–99)
Potassium: 4.1 mmol/L (ref 3.5–5.1)
Sodium: 134 mmol/L — ABNORMAL LOW (ref 135–145)

## 2022-06-06 LAB — CULTURE, BLOOD (ROUTINE X 2): Culture: NO GROWTH

## 2022-06-06 LAB — APTT
aPTT: 40 seconds — ABNORMAL HIGH (ref 24–36)
aPTT: 59 seconds — ABNORMAL HIGH (ref 24–36)

## 2022-06-06 LAB — CBC
HCT: 33.4 % — ABNORMAL LOW (ref 36.0–46.0)
Hemoglobin: 11 g/dL — ABNORMAL LOW (ref 12.0–15.0)
MCH: 31.2 pg (ref 26.0–34.0)
MCHC: 32.9 g/dL (ref 30.0–36.0)
MCV: 94.6 fL (ref 80.0–100.0)
Platelets: 254 10*3/uL (ref 150–400)
RBC: 3.53 MIL/uL — ABNORMAL LOW (ref 3.87–5.11)
RDW: 15.5 % (ref 11.5–15.5)
WBC: 9.8 10*3/uL (ref 4.0–10.5)
nRBC: 0.2 % (ref 0.0–0.2)

## 2022-06-06 LAB — HEPARIN LEVEL (UNFRACTIONATED)
Heparin Unfractionated: >1.1 IU/mL — ABNORMAL HIGH (ref 0.30–0.70)
Heparin Unfractionated: >1.1 IU/mL — ABNORMAL HIGH (ref 0.30–0.70)

## 2022-06-06 LAB — GLUCOSE, CAPILLARY: Glucose-Capillary: 98 mg/dL (ref 70–99)

## 2022-06-06 MED ORDER — METOPROLOL SUCCINATE ER 25 MG PO TB24
12.5000 mg | ORAL_TABLET | Freq: Every day | ORAL | Status: DC
  Administered 2022-06-06 – 2022-06-09 (×4): 12.5 mg via ORAL
  Filled 2022-06-06 (×4): qty 1

## 2022-06-06 MED ORDER — VANCOMYCIN HCL 750 MG/150ML IV SOLN
750.0000 mg | INTRAVENOUS | Status: DC
  Administered 2022-06-07 – 2022-06-08 (×2): 750 mg via INTRAVENOUS
  Filled 2022-06-06 (×2): qty 150

## 2022-06-06 MED ORDER — FUROSEMIDE 10 MG/ML IJ SOLN
40.0000 mg | Freq: Two times a day (BID) | INTRAMUSCULAR | Status: DC
  Administered 2022-06-06 (×2): 40 mg via INTRAVENOUS
  Filled 2022-06-06 (×3): qty 4

## 2022-06-06 MED ORDER — ALBUMIN HUMAN 25 % IV SOLN
25.0000 g | Freq: Four times a day (QID) | INTRAVENOUS | Status: AC
  Administered 2022-06-06: 25 g via INTRAVENOUS
  Administered 2022-06-06 (×2): 12.5 g via INTRAVENOUS
  Filled 2022-06-06 (×2): qty 100

## 2022-06-06 MED ORDER — ALBUMIN HUMAN 25 % IV SOLN
25.0000 g | Freq: Once | INTRAVENOUS | Status: AC
  Administered 2022-06-06: 25 g via INTRAVENOUS
  Filled 2022-06-06: qty 100

## 2022-06-06 NOTE — Progress Notes (Signed)
Occupational Therapy Treatment Patient Details Name: Christine Jacobson MRN: [id number] DOB: 1955-07-01 Today's Date: 06/06/2022   History of present illness 67 y.o. female admitted on 06/02/2022 from PCP office with L LE wound infection.  Presented with bilateral leg edema with left lower leg weeping. WBC12.3, lactate 2.2, afebrile, HR 80s, RR <20/min, and BP now 100s systolic. Currently with significant AKI, Scr 2.16 - baseline 0.91. PMH: A-fib on Eliquis, HOCM, CHF   OT comments  Session very limited as pt reports she is nauseated and does not feel she can continue politely. Pt helped into a chair position with all items needed for reduction / addressing nausea placed next to patient ( blue emesis bag, alcohol swap, wash cloth) Recommendations for HHOT continue at this time.    Recommendations for follow up therapy are one component of a multi-disciplinary discharge planning process, led by the attending physician.  Recommendations may be updated based on patient status, additional functional criteria and insurance authorization.    Assistance Recommended at Discharge Set up Supervision/Assistance  Patient can return home with the following  A little help with walking and/or transfers;A little help with bathing/dressing/bathroom;Assist for transportation   Equipment Recommendations  BSC/3in1    Recommendations for Other Services      Precautions / Restrictions Precautions Precautions: Fall Precaution Comments: bil LE wounds Restrictions Weight Bearing Restrictions: No       Mobility Bed Mobility               General bed mobility comments: in chair declined bed    Transfers                   General transfer comment: declined     Balance                                           ADL either performed or assessed with clinical judgement   ADL Overall ADL's : Needs assistance/impaired                     Lower Body Dressing: Min  guard Lower Body Dressing Details (indicate cue type and reason): pt using pants leg to pull up leg into figure 4 but unable to sustain position. pt reports this is how i do it or i bend over. pt bending over and pulling sock up on dressing without full positioning. pt reports nausea after task. Ot educated on use of reacher to help as needed as a possible solution. Pt states "i can see how that helps"  pt requesting to stop and lay back in chair after LB task               General ADL Comments: session limited by nauseated and fatigue. Pt educated on use of recliner and repositioning for pressure relief    Extremity/Trunk Assessment Upper Extremity Assessment Upper Extremity Assessment: Overall WFL for tasks assessed   Lower Extremity Assessment Lower Extremity Assessment: Defer to PT evaluation        Vision       Perception     Praxis      Cognition Arousal/Alertness: Awake/alert Behavior During Therapy: Flat affect Overall Cognitive Status: Within Functional Limits for tasks assessed  General Comments: reports "just not having a good day today"        Exercises      Shoulder Instructions       General Comments VSS on RA    Pertinent Vitals/ Pain       Pain Assessment Pain Assessment: Faces Faces Pain Scale: Hurts even more Pain Location: bil LE Pain Descriptors / Indicators: Sore Pain Intervention(s): Monitored during session, Limited activity within patient's tolerance (nauseated)  Home Living                                          Prior Functioning/Environment              Frequency  Min 2X/week        Progress Toward Goals  OT Goals(current goals can now be found in the care plan section)  Progress towards OT goals: Progressing toward goals  Acute Rehab OT Goals Patient Stated Goal: to rest OT Goal Formulation: With patient Time For Goal Achievement:  06/17/22 Potential to Achieve Goals: Good ADL Goals Pt Will Perform Lower Body Dressing: with modified independence;with adaptive equipment;sit to/from stand Pt Will Transfer to Toilet: with modified independence;ambulating;bedside commode Pt/caregiver will Perform Home Exercise Program: Increased strength;With minimal assist;With written HEP provided Additional ADL Goal #1: pt will complete adl with 2 energy conservation strageties from packet mod i Additional ADL Goal #2: 1350  Plan Discharge plan remains appropriate    Co-evaluation                 AM-PAC OT "6 Clicks" Daily Activity     Outcome Measure   Help from another person eating meals?: None Help from another person taking care of personal grooming?: A Little Help from another person toileting, which includes using toliet, bedpan, or urinal?: A Little Help from another person bathing (including washing, rinsing, drying)?: A Little Help from another person to put on and taking off regular upper body clothing?: A Little Help from another person to put on and taking off regular lower body clothing?: A Little 6 Click Score: 19    End of Session    OT Visit Diagnosis: Unsteadiness on feet (R26.81);Muscle weakness (generalized) (M62.81)   Activity Tolerance Patient limited by fatigue   Patient Left in chair;with call bell/phone within reach   Nurse Communication Mobility status;Precautions        Time: 1350 (1150)-1400 OT Time Calculation (min): 10 min  Charges: OT General Charges $OT Visit: 1 Visit OT Treatments $Self Care/Home Management : 8-22 mins   Brynn, OTR/L  Acute Rehabilitation Services Office: [phone] .   Mateo Flow 06/06/2022, 2:26 PM

## 2022-06-06 NOTE — Care Management Important Message (Signed)
Important Message  Patient Details  Name: Christine Jacobson MRN: [id number] Date of Birth: 03/18/1955   Medicare Important Message Given:  Yes     Ting Cage 06/06/2022, 2:22 PM

## 2022-06-06 NOTE — Progress Notes (Signed)
ANTICOAGULATION CONSULT NOTE - Initial Consult  Pharmacy Consult for IV heparin (holding Eliquis) Indication: atrial fibrillation  Allergies  Allergen Reactions   Beef-Derived Products     Does not eat pork d/t religious reasons, but okay with receiving heparin.  Discussed with patient 06/05/22   Chicken Protein    Fish-Derived Products    Pork-Derived Products     Patient Measurements: Height: 5\' 4"  (162.6 cm) Weight: 90.4 kg (199 lb 4.7 oz) IBW/kg (Calculated) : 54.7   Vital Signs: Temp: 97.9 F (36.6 C) (05/20 1930) Temp Source: Oral (05/20 1930) BP: 116/80 (05/20 1930) Pulse Rate: 55 (05/20 1930)  Labs: Recent Labs    06/04/22 0025 06/05/22 0017 06/06/22 0757 06/06/22 0923 06/06/22 2011  HGB 11.3* 10.9* 11.0*  --   --   HCT 33.7* 32.5* 33.4*  --   --   PLT 279 261 254  --   --   APTT  --   --   --  40* 59*  HEPARINUNFRC  --   --  >1.10*  --  >1.10*  CREATININE 1.61* 1.41* 1.29*  --   --      Estimated Creatinine Clearance: 44.8 mL/min (A) (by C-G formula based on SCr of 1.29 mg/dL (H)).   Medical History: Past Medical History:  Diagnosis Date   Chest pain 11/22/2010   2D STRESS ECHO - EF 60%, peak stress EF 80%, normal, no evidence for stress-induced ischemia   HOCM (hypertrophic obstructive cardiomyopathy) (HCC) 06/21/2011   2D ECHO - EF >55%, normal   HTN (hypertension)    Hyperprolactinemia (HCC)    dx in her 18, took meds, self d/c a while back   Liver function test abnormality    normal when repeated   Mild hyperlipidemia    Obesity    Palpitations    negative stress echo in November 2012 with normal LV function; mild LVH, proximal septal thickening with narrow LVOT and mild gradient; mild MR and TR; Cardionet showed PACs in November 2012   Pyoderma gangrenosa    Shortness of breath 07/11/2011   MET TEST    Medications:  Infusions:   ceFEPime (MAXIPIME) IV 2 g (06/06/22 1518)   heparin 1,100 Units/hr (06/06/22 1900)   [START ON 06/07/2022]  vancomycin      Assessment: 67 yo female on chronic Eliquis for afib, now with bleeding wounds.  Pharmacy asked to hold Eliquis and start IV heparin.  Of note - pt has allergy in chart to pork derived products.  I discussed with pt, she does not eat pork due to religious reasons, but is not opposed to receiving IV heparin, which is pork derived.  aPTT slightly subtherapeutic at 59 seconds, HL still falsely elevated at > 1.1.   Goal of Therapy:  Heparin level 0.3-0.7 units/ml aPTT 66-102 seconds Monitor platelets by anticoagulation protocol: Yes   Plan:  Increase heparin to 1250 units/hr Check 8 hr heparin level/PTT Daily heparin level and CBC.  Rexford Maus, PharmD, BCPS 06/06/2022 9:21 PM

## 2022-06-06 NOTE — Progress Notes (Signed)
Pharmacy Antibiotic Note  Christine Jacobson is a 67 y.o. female admitted on 06/02/2022 with  wound infection .  Pharmacy has been consulted for vancomycin and cefepime dosing.  Patient presented with bilateral leg edema with left lower leg weeping. WBC Currently with significant AKI, Scr 2.16 > 1.61 - baseline 0.91.  Scr down to 1.29. We will adjust vanc again today.   Plan: Change vancomycin to 750mg  q 24 hrs (calc AUC 491 w/ Scr 1.29)  Continue cefepime  2g q 12 hrs. F/u signs and symptoms of infection to alter antibiotic selection and duration  Height: 5\' 4"  (162.6 cm) Weight: 90.4 kg (199 lb 4.7 oz) IBW/kg (Calculated) : 54.7  Temp (24hrs), Avg:97.9 F (36.6 C), Min:97.5 F (36.4 C), Max:98.4 F (36.9 C)  Recent Labs  Lab 06/02/22 1123 06/02/22 1142 06/03/22 0028 06/04/22 0025 06/05/22 0017 06/06/22 0757  WBC 12.3*  --  12.9* 9.4 9.6 9.8  CREATININE 2.16*  --  2.04* 1.61* 1.41* 1.29*  LATICACIDVEN  --  2.2* 1.4  --   --   --      Estimated Creatinine Clearance: 44.8 mL/min (A) (by C-G formula based on SCr of 1.29 mg/dL (H)).    Allergies  Allergen Reactions   Beef-Derived Products     Does not eat pork d/t religious reasons, but okay with receiving heparin.  Discussed with patient 06/05/22   Chicken Protein    Fish-Derived Products    Pork-Derived Products     Antimicrobials this admission: Vancomycin 5/16 >>   Cefepime 5/16 >>    Microbiology results: 5/17 UCx > 5/16 BCx  >   Ulyses Southward, PharmD, [place], AAHIVP, CPP Infectious Disease Pharmacist 06/06/2022 11:31 AM

## 2022-06-06 NOTE — Progress Notes (Signed)
Physical Therapy Treatment Patient Details Name: Christine Jacobson MRN: [id number] DOB: 12/02/1955 Today's Date: 06/06/2022   History of Present Illness 67 y.o. female admitted on 06/02/2022 from PCP office with L LE wound infection.  Presented with bilateral leg edema with left lower leg weeping. WBC12.3, lactate 2.2, afebrile, HR 80s, RR <20/min, and BP now 100s systolic. Currently with significant AKI, Scr 2.16 - baseline 0.91. PMH: A-fib on Eliquis, HOCM, CHF    PT Comments    Pt reporting fatigue and frustration with hospitalization and not being able to do everything on her time frame. Pt able to increase gait distance and perform HEP this session. Pt encouraged to continue to progress mobility with staff and current plan appropriate.   97% on RA   Recommendations for follow up therapy are one component of a multi-disciplinary discharge planning process, led by the attending physician.  Recommendations may be updated based on patient status, additional functional criteria and insurance authorization.  Follow Up Recommendations       Assistance Recommended at Discharge PRN  Patient can return home with the following Assist for transportation   Equipment Recommendations  None recommended by PT    Recommendations for Other Services       Precautions / Restrictions Precautions Precautions: Fall Precaution Comments: bil LE wounds     Mobility  Bed Mobility               General bed mobility comments: pt in chair on arrival and end of session    Transfers Overall transfer level: Needs assistance     Sit to Stand: Supervision           General transfer comment: supervision for lines with cues for hand placement, pt able to perform 5 repeated sit to stands end of session with reliance on bil UE    Ambulation/Gait Ambulation/Gait assistance: Min guard Gait Distance (Feet): 150 Feet Assistive device: Rolling walker (2 wheels) Gait Pattern/deviations: Step-through  pattern, Decreased stride length   Gait velocity interpretation: <1.8 ft/sec, indicate of risk for recurrent falls   General Gait Details: cues for posture, proximity to rW and direction   Stairs             Wheelchair Mobility    Modified Rankin (Stroke Patients Only)       Balance Overall balance assessment: Needs assistance Sitting-balance support: Feet supported Sitting balance-Leahy Scale: Fair     Standing balance support: Bilateral upper extremity supported, During functional activity, Reliant on assistive device for balance Standing balance-Leahy Scale: Poor Standing balance comment: RW in standing or armrests of chair                            Cognition Arousal/Alertness: Awake/alert Behavior During Therapy: WFL for tasks assessed/performed Overall Cognitive Status: Within Functional Limits for tasks assessed                                 General Comments: pt slightly overwhelmed with activities and hospitalization        Exercises General Exercises - Lower Extremity Long Arc Quad: AROM, Both, 20 reps, Seated Hip Flexion/Marching: AROM, Both, 20 reps, Seated    General Comments        Pertinent Vitals/Pain Pain Assessment Pain Assessment: Faces Faces Pain Scale: Hurts a little bit Pain Location: bil LE Pain Descriptors / Indicators: Sore Pain Intervention(s): Limited activity  within patient's tolerance, Monitored during session    Home Living                          Prior Function            PT Goals (current goals can now be found in the care plan section) Progress towards PT goals: Progressing toward goals    Frequency    Min 1X/week      PT Plan Current plan remains appropriate    Co-evaluation              AM-PAC PT "6 Clicks" Mobility   Outcome Measure  Help needed turning from your back to your side while in a flat bed without using bedrails?: None Help needed moving from  lying on your back to sitting on the side of a flat bed without using bedrails?: None Help needed moving to and from a bed to a chair (including a wheelchair)?: A Little Help needed standing up from a chair using your arms (e.g., wheelchair or bedside chair)?: A Little Help needed to walk in hospital room?: A Little Help needed climbing 3-5 steps with a railing? : A Lot 6 Click Score: 19    End of Session   Activity Tolerance: Patient tolerated treatment well Patient left: in chair;with call bell/phone within reach Nurse Communication: Mobility status PT Visit Diagnosis: Other abnormalities of gait and mobility (R26.89);Muscle weakness (generalized) (M62.81);Difficulty in walking, not elsewhere classified (R26.2);Pain     Time: 1610-9604 PT Time Calculation (min) (ACUTE ONLY): 25 min  Charges:  $Gait Training: 8-22 mins $Therapeutic Exercise: 8-22 mins                     Merryl Hacker, PT Acute Rehabilitation Services Office: [phone]    [name] Liandra Mendia 06/06/2022, 12:04 PM

## 2022-06-06 NOTE — Progress Notes (Signed)
ANTICOAGULATION CONSULT NOTE - Initial Consult  Pharmacy Consult for IV heparin (holding Eliquis) Indication: atrial fibrillation  Allergies  Allergen Reactions   Beef-Derived Products     Does not eat pork d/t religious reasons, but okay with receiving heparin.  Discussed with patient 06/05/22   Chicken Protein    Fish-Derived Products    Pork-Derived Products     Patient Measurements: Height: 5\' 4"  (162.6 cm) Weight: 90.4 kg (199 lb 4.7 oz) IBW/kg (Calculated) : 54.7   Vital Signs: Temp: 97.9 F (36.6 C) (05/20 1105) Temp Source: Oral (05/20 1105) BP: 100/87 (05/20 1105) Pulse Rate: 95 (05/20 1105)  Labs: Recent Labs    06/04/22 0025 06/05/22 0017 06/06/22 0757 06/06/22 0923  HGB 11.3* 10.9* 11.0*  --   HCT 33.7* 32.5* 33.4*  --   PLT 279 261 254  --   APTT  --   --   --  40*  HEPARINUNFRC  --   --  >1.10*  --   CREATININE 1.61* 1.41* 1.29*  --      Estimated Creatinine Clearance: 44.8 mL/min (A) (by C-G formula based on SCr of 1.29 mg/dL (H)).   Medical History: Past Medical History:  Diagnosis Date   Chest pain 11/22/2010   2D STRESS ECHO - EF 60%, peak stress EF 80%, normal, no evidence for stress-induced ischemia   HOCM (hypertrophic obstructive cardiomyopathy) (HCC) 06/21/2011   2D ECHO - EF >55%, normal   HTN (hypertension)    Hyperprolactinemia (HCC)    dx in her 18, took meds, self d/c a while back   Liver function test abnormality    normal when repeated   Mild hyperlipidemia    Obesity    Palpitations    negative stress echo in November 2012 with normal LV function; mild LVH, proximal septal thickening with narrow LVOT and mild gradient; mild MR and TR; Cardionet showed PACs in November 2012   Pyoderma gangrenosa    Shortness of breath 07/11/2011   MET TEST    Medications:  Infusions:   albumin human 12.5 g (06/06/22 1018)   ceFEPime (MAXIPIME) IV 2 g (06/06/22 0356)   heparin 950 Units/hr (06/05/22 2325)   [START ON 06/07/2022] vancomycin       Assessment: 67 yo female on chronic Eliquis for afib, now with bleeding wounds.  Pharmacy asked to hold Eliquis and start IV heparin.  Of note - pt has allergy in chart to pork derived products.  I discussed with pt, she does not eat pork due to religious reasons, but is not opposed to receiving IV heparin, which is pork derived.  PTT came back subtherapeutic. It has not correlated with heparin level yet. Mild bleeding from leg wounds per Rn. We will increase rate and check another level.    Goal of Therapy:  Heparin level 0.3-0.7 units/ml Monitor platelets by anticoagulation protocol: Yes   Plan:  Increase heparin 1100 units/hr Check 8 hr heparin level/PTT Daily heparin level and CBC.  Ulyses Southward, PharmD, BCIDP, AAHIVP, CPP Infectious Disease Pharmacist 06/06/2022 11:29 AM

## 2022-06-06 NOTE — Consult Note (Signed)
WOC Nurse Consult Note: Reason for Consult:Bilateral lower extremity wounds, chronic.  Recent increase in redness, pain and bleeding.  Cellulitis to lower legs, on vancomycin.  Holding Eliquis, on IV heparin for atrial fibrillation.   Was last seen at wound care center 10/2021 and has since been treating at home with neosporin. States she cannot tolerate coban at this time for compression, agrees to ace wrap.  Wound type: Lymphedema Pressure Injury POA: NA Measurement: LEft posterior lower leg:  10 cm x 4 cm x 0.3 cm  Multiple scattered abrasion to left lower leg, chronic skin changes, generalized edema LEft lateral leg:  5 cm x 4 cm x 0.3 cm  Right posterior lower leg:  14 cm x 3 cmx 0.3 cm  Right medial malleolus:  2 cm x 2 cm x 0.2 cm  Wound ZOX:WRUEA red, moderate serosanguinous weeping.  No bleeding at this time.   Drainage (amount, consistency, odor) moderate weeping.  Has been bleeding with wound care, none noted this AM with assessment.  Periwound: Edema to lower legs, chronic skin changes Dressing procedure/placement/frequency: Bedside RN to perform, provide analgesia as needed prior to wound care.  Cleanse legs with soap and water and pat dry. Apply Aquacel to open wounds([facility] # [id number])  WIll need 4 sheets each dressing change.  Cover with gauze and kerlix.  Ace wrap from below toes to below knee for modified light compression.  CHange every other day and PRN soilage (strikethrough drainage) .  Will not follow at this time.  Please re-consult if needed.  Mike Gip MSN, RN, FNP-BC CWON Wound, Ostomy, Continence Nurse Outpatient [facility] [phone] Pager [phone]

## 2022-06-06 NOTE — Progress Notes (Signed)
[facility]                                                                              Christine Jacobson, is a 67 y.o. female, DOB - 12-01-1955, ZOX:[id number] Admit date - 06/02/2022    Outpatient Primary MD for the patient is Turmel, Caleb P, PA  LOS - 4  days  No chief complaint on file.      Brief summary   Patient is a 67 year old female HTN, hyperlipidemia, atrial fibrillation on Eliquis, HOCM chronic diastolic CHF presented to ED from the PCPs office with lower extremity wounds.  Patient reported that wounds have been managed at the wound care center for the last several months.  Currently she was self treating them by using topical Neosporin and washing them.  She was seen at her PCPs office and it was felt that the wounds were looking worse.  Patient was also noted to have low BP and hence sent to ED due to concern for sepsis.  Patient did report some lightheadedness, chills, intermittent shortness of breath off and on for about a week In the ED, BP 106/64, down to 90/57, sats 98% on room air, pulse 82, afebrile. CBC with white count 12.3 with left shift, lactic acid 2.2. X-ray tib-fib showed moderate dorsal midfoot soft tissue swelling  Assessment & Plan    Principal Problem: Severe sepsis due to cellulitis Christine Jacobson), bilateral lower extremity, POA -Presented with hypotension, bilateral lower extremity cellulitis, acute kidney injury, lactic acidosis -Blood cultures so far negative, urine cultures with insignificant growth  -Continue IV vancomycin, cefepime, wound care following -Eliquis current on hold due to significant bleeding from the wounds on 5/19 -ABIs pending  Acute kidney injury, metabolic acidosis -Creatinine 8.11 on admission, baseline 0.8-0.9 -Blood cultures negative so far -Renal ultrasound showed no hydronephrosis.  Incompletely assessed possible cystic masses of the pelvis likely within the bilateral adnexa, recommended CT abdomen pelvis.   Patient declined CT abdomen. -Creatinine improving  Chronic diastolic CHF/HOCM, significant peripheral edema with lymphedema -Albumin 2.3 on 5/17, been placed on IV albumin x3 with increased IV Lasix 40 mg every 12 hours (Outpatient on torsemide 40 mg daily) - Reassess in a.m.  Paroxysmal A-fib -Heart rate controlled, continue Toprol-XL.  Cardizem currently held  -Continue to hold Eliquis, currently on IV heparin     Hyponatremia -Sodium 129 on admission,  -Urine osmolality 556, UNA <10, serum osmolality 295, IV fluids discontinued - ibuprofen, Jardiance, torsemide held -Sodium improving   Hyperlipidemia -Lipid panel showed triglycerides 172, LDL 146, cholesterol 228 -Continue Crestor  Obesity Estimated body mass index is 34.21 kg/m as calculated from the following:   Height as of this encounter: 5\' 4"  (1.626 m).   Weight as of this encounter: 90.4 kg.  Code Status: DNR DVT Prophylaxis:     Level of Care: Level of care: Progressive Family Communication: Updated patient Disposition Plan:      Remains inpatient appropriate:      Procedures:    Consultants:     Antimicrobials:   Anti-infectives (From admission, onward)    Start     Dose/Rate Route Frequency Ordered Stop  06/06/22 0600  vancomycin (VANCOCIN) IVPB 1000 mg/200 mL premix        1,000 mg 200 mL/hr over 60 Minutes Intravenous Every 36 hours 06/05/22 1424     06/04/22 1700  vancomycin (VANCOCIN) IVPB 1000 mg/200 mL premix  Status:  Discontinued        1,000 mg 200 mL/hr over 60 Minutes Intravenous Every 48 hours 06/03/22 0813 06/05/22 1424   06/04/22 1530  ceFEPIme (MAXIPIME) 2 g in sodium chloride 0.9 % 100 mL IVPB        2 g 200 mL/hr over 30 Minutes Intravenous Every 12 hours 06/04/22 1525     06/02/22 1744  vancomycin variable dose per unstable renal function (pharmacist dosing)  Status:  Discontinued         Does not apply See admin instructions 06/02/22 1744 06/03/22 0812   06/02/22 1730   ceFEPIme (MAXIPIME) 2 g in sodium chloride 0.9 % 100 mL IVPB  Status:  Discontinued        2 g 200 mL/hr over 30 Minutes Intravenous Every 24 hours 06/02/22 1723 06/04/22 1525   06/02/22 1730  vancomycin (VANCOREADY) IVPB 1500 mg/300 mL        1,500 mg 150 mL/hr over 120 Minutes Intravenous  Once 06/02/22 1730 06/03/22 1539   06/02/22 1345  vancomycin (VANCOCIN) IVPB 1000 mg/200 mL premix        1,000 mg 200 mL/hr over 60 Minutes Intravenous  Once 06/02/22 1344 06/02/22 1533          Medications  furosemide  40 mg Intravenous Q12H   melatonin  3 mg Oral QHS   metoprolol succinate  12.5 mg Oral Daily   pneumococcal 20-valent conjugate vaccine  0.5 mL Intramuscular Tomorrow-1000   rosuvastatin  20 mg Oral Daily   sertraline  50 mg Oral QHS   sodium chloride flush  3 mL Intravenous Q12H   triamcinolone cream   Topical Daily      Subjective:   Christine Jacobson was seen and examined today.  Lower extremity wounds examined, no significant bleeding today.  Still has significant redness, weeping, edema.  No chest pain or shortness of breath, no fevers.    Objective:   Vitals:   06/06/22 0358 06/06/22 0500 06/06/22 0752 06/06/22 0802  BP:    104/70  Pulse:    94  Resp:    18  Temp: 97.8 F (36.6 C)   97.9 F (36.6 C)  TempSrc:    Oral  SpO2:   98%   Weight:  90.4 kg    Height:        Intake/Output Summary (Last 24 hours) at 06/06/2022 1049 Last data filed at 06/06/2022 0800 Gross per 24 hour  Intake 120 ml  Output --  Net 120 ml     Wt Readings from Last 3 Encounters:  06/06/22 90.4 kg  02/10/22 99.5 kg  02/01/22 86.2 kg    Physical Exam General: Alert and oriented x 3, NAD Cardiovascular: S1 S2 clear, RRR.  Respiratory: CTAB, no wheezing, rales or rhonchi Gastrointestinal: Soft, nontender, nondistended, NBS Ext: 2+ pedal edema bilaterally Neuro: no new deficits Skin: See picture from exam today 5/20, bilateral lower extremity edema, erythema, has lymphedema  with chronic venous stasis    Data Reviewed:  I have personally reviewed following labs    CBC Lab Results  Component Value Date   WBC 9.8 06/06/2022   RBC 3.53 (L) 06/06/2022   HGB 11.0 (L) 06/06/2022   HCT  33.4 (L) 06/06/2022   MCV 94.6 06/06/2022   MCH 31.2 06/06/2022   PLT 254 06/06/2022   MCHC 32.9 06/06/2022   RDW 15.5 06/06/2022   LYMPHSABS 0.7 06/03/2022   MONOABS 1.2 (H) 06/03/2022   EOSABS 0.1 06/03/2022   BASOSABS 0.1 06/03/2022     Last metabolic panel Lab Results  Component Value Date   NA 134 (L) 06/06/2022   K 4.1 06/06/2022   CL 103 06/06/2022   CO2 21 (L) 06/06/2022   BUN 30 (H) 06/06/2022   CREATININE 1.29 (H) 06/06/2022   GLUCOSE 167 (H) 06/06/2022   GFRNONAA 45 (L) 06/06/2022   GFRAA >60 07/18/2019   CALCIUM 9.3 06/06/2022   PROT 5.9 (L) 06/03/2022   ALBUMIN 2.3 (L) 06/03/2022   LABGLOB 2.5 12/02/2021   AGRATIO 1.6 12/02/2021   BILITOT 0.4 06/03/2022   ALKPHOS 305 (H) 06/03/2022   AST 29 06/03/2022   ALT 44 06/03/2022   ANIONGAP 10 06/06/2022    CBG (last 3)  Recent Labs    06/04/22 0559 06/05/22 0518 06/06/22 0621  GLUCAP 89 102* 98      Coagulation Profile: No results for input(s): "INR", "PROTIME" in the last 168 hours.   Radiology Studies: I have personally reviewed the imaging studies  No results found.     Thad Ranger M.D. [facility] 06/06/2022, 10:49 AM  Available via Epic secure chat 7am-7pm After 7 pm, please refer to night coverage provider listed on amion.

## 2022-06-07 ENCOUNTER — Encounter (HOSPITAL_COMMUNITY): Payer: Medicare Other

## 2022-06-07 LAB — CBC
HCT: 29.2 % — ABNORMAL LOW (ref 36.0–46.0)
Hemoglobin: 9.7 g/dL — ABNORMAL LOW (ref 12.0–15.0)
MCH: 31.4 pg (ref 26.0–34.0)
MCHC: 33.2 g/dL (ref 30.0–36.0)
MCV: 94.5 fL (ref 80.0–100.0)
Platelets: 228 10*3/uL (ref 150–400)
RBC: 3.09 MIL/uL — ABNORMAL LOW (ref 3.87–5.11)
RDW: 15.3 % (ref 11.5–15.5)
WBC: 9.6 10*3/uL (ref 4.0–10.5)
nRBC: 0 % (ref 0.0–0.2)

## 2022-06-07 LAB — GLUCOSE, CAPILLARY: Glucose-Capillary: 107 mg/dL — ABNORMAL HIGH (ref 70–99)

## 2022-06-07 LAB — BASIC METABOLIC PANEL
Anion gap: 10 (ref 5–15)
BUN: 30 mg/dL — ABNORMAL HIGH (ref 8–23)
CO2: 23 mmol/L (ref 22–32)
Calcium: 9.8 mg/dL (ref 8.9–10.3)
Chloride: 102 mmol/L (ref 98–111)
Creatinine, Ser: 1.25 mg/dL — ABNORMAL HIGH (ref 0.44–1.00)
GFR, Estimated: 47 mL/min — ABNORMAL LOW (ref >60)
Glucose, Bld: 106 mg/dL — ABNORMAL HIGH (ref 70–99)
Potassium: 3.4 mmol/L — ABNORMAL LOW (ref 3.5–5.1)
Sodium: 135 mmol/L (ref 135–145)

## 2022-06-07 LAB — APTT: aPTT: 108 seconds — ABNORMAL HIGH (ref 24–36)

## 2022-06-07 LAB — CULTURE, BLOOD (ROUTINE X 2)

## 2022-06-07 LAB — HEPARIN LEVEL (UNFRACTIONATED): Heparin Unfractionated: >1.1 IU/mL — ABNORMAL HIGH (ref 0.30–0.70)

## 2022-06-07 MED ORDER — FUROSEMIDE 10 MG/ML IJ SOLN
40.0000 mg | Freq: Two times a day (BID) | INTRAMUSCULAR | Status: DC
  Administered 2022-06-07 – 2022-06-09 (×5): 40 mg via INTRAVENOUS
  Filled 2022-06-07 (×4): qty 4

## 2022-06-07 MED ORDER — POTASSIUM CHLORIDE CRYS ER 20 MEQ PO TBCR
40.0000 meq | EXTENDED_RELEASE_TABLET | Freq: Once | ORAL | Status: AC
  Administered 2022-06-07: 40 meq via ORAL
  Filled 2022-06-07: qty 2

## 2022-06-07 NOTE — Progress Notes (Signed)
[facility]                                                                              Christine Jacobson, is a 67 y.o. female, DOB - 09-15-1955, ZOX:[id number] Admit date - 06/02/2022    Outpatient Primary MD for the patient is Turmel, Caleb P, PA  LOS - 5  days  No chief complaint on file.      Brief summary   Patient is a 67 year old female HTN, hyperlipidemia, atrial fibrillation on Eliquis, HOCM chronic diastolic CHF presented to ED from the PCPs office with lower extremity wounds.  Patient reported that wounds have been managed at the wound care center for the last several months.  Currently she was self treating them by using topical Neosporin and washing them.  She was seen at her PCPs office and it was felt that the wounds were looking worse.  Patient was also noted to have low BP and hence sent to ED due to concern for sepsis.  Patient did report some lightheadedness, chills, intermittent shortness of breath off and on for about a week In the ED, BP 106/64, down to 90/57, sats 98% on room air, pulse 82, afebrile. CBC with white count 12.3 with left shift, lactic acid 2.2. X-ray tib-fib showed moderate dorsal midfoot soft tissue swelling  Assessment & Plan    Principal Problem: Severe sepsis due to cellulitis [facility]), bilateral lower extremity, POA -Presented with hypotension, bilateral lower extremity cellulitis, acute kidney injury, lactic acidosis -Blood cultures so far negative, urine cultures with insignificant growth  -Continue IV vancomycin, cefepime, wound care following -Eliquis current on hold due to significant bleeding from the wounds on 5/19 -ABIs pending   Acute kidney injury, metabolic acidosis -Creatinine 8.11 on admission, baseline 0.8-0.9 -Blood cultures negative so far -Renal ultrasound showed no hydronephrosis.  Incompletely assessed possible cystic masses of the pelvis likely within the bilateral adnexa, recommended CT abdomen pelvis.   Patient declined CT abdomen. -Creatinine improving, 1.2  Chronic diastolic CHF/HOCM, significant peripheral edema with lymphedema -Albumin 2.3 on 5/17, placed on IV albumin x 3,  -Continue IV Lasix 40 mg every 12 hours  (Outpatient on torsemide 40 mg daily) -Strict I's and O's and daily weights.   Paroxysmal A-fib -Heart rate controlled, continue Toprol-XL.  Cardizem currently held  -Continue to hold Eliquis, currently on IV heparin     Hyponatremia -Sodium 129 on admission,  -Urine osmolality 556, UNA <10, serum osmolality 295, IV fluids discontinued - ibuprofen, Jardiance, torsemide held -Improved, 135  Hypokalemia -Replaced   Hyperlipidemia -Lipid panel showed triglycerides 172, LDL 146, cholesterol 228 -Continue Crestor  Obesity Estimated body mass index is 34.36 kg/m as calculated from the following:   Height as of this encounter: 5\' 4"  (1.626 m).   Weight as of this encounter: 90.8 kg.  Code Status: DNR DVT Prophylaxis:  IV heparin   Level of Care: Level of care: Progressive Family Communication: Updated patient Disposition Plan:      Remains inpatient appropriate:   Discussed in detail with the patient, she lives at home alone and will have difficulty with the wound care.  Patient does  not have the car anymore and will have difficulty with transportation to get to the outpatient wound care facility for wound care.  She prefers to go to a rehab place for short time if possible.  TOC notified.   Procedures:   Consultants:    Antimicrobials:   Anti-infectives (From admission, onward)    Start     Dose/Rate Route Frequency Ordered Stop   06/07/22 0700  vancomycin (VANCOREADY) IVPB 750 mg/150 mL        750 mg 150 mL/hr over 60 Minutes Intravenous Every 24 hours 06/06/22 1127     06/06/22 0600  vancomycin (VANCOCIN) IVPB 1000 mg/200 mL premix  Status:  Discontinued        1,000 mg 200 mL/hr over 60 Minutes Intravenous Every 36 hours 06/05/22 1424 06/06/22 1127    06/04/22 1700  vancomycin (VANCOCIN) IVPB 1000 mg/200 mL premix  Status:  Discontinued        1,000 mg 200 mL/hr over 60 Minutes Intravenous Every 48 hours 06/03/22 0813 06/05/22 1424   06/04/22 1530  ceFEPIme (MAXIPIME) 2 g in sodium chloride 0.9 % 100 mL IVPB        2 g 200 mL/hr over 30 Minutes Intravenous Every 12 hours 06/04/22 1525     06/02/22 1744  vancomycin variable dose per unstable renal function (pharmacist dosing)  Status:  Discontinued         Does not apply See admin instructions 06/02/22 1744 06/03/22 0812   06/02/22 1730  ceFEPIme (MAXIPIME) 2 g in sodium chloride 0.9 % 100 mL IVPB  Status:  Discontinued        2 g 200 mL/hr over 30 Minutes Intravenous Every 24 hours 06/02/22 1723 06/04/22 1525   06/02/22 1730  vancomycin (VANCOREADY) IVPB 1500 mg/300 mL        1,500 mg 150 mL/hr over 120 Minutes Intravenous  Once 06/02/22 1730 06/03/22 1539   06/02/22 1345  vancomycin (VANCOCIN) IVPB 1000 mg/200 mL premix        1,000 mg 200 mL/hr over 60 Minutes Intravenous  Once 06/02/22 1344 06/02/22 1533          Medications  furosemide  40 mg Intravenous Q12H   melatonin  3 mg Oral QHS   metoprolol succinate  12.5 mg Oral Daily   pneumococcal 20-valent conjugate vaccine  0.5 mL Intramuscular Tomorrow-1000   rosuvastatin  20 mg Oral Daily   sertraline  50 mg Oral QHS   sodium chloride flush  3 mL Intravenous Q12H   triamcinolone cream   Topical Daily      Subjective:   Sinaya Cissell was seen and examined today.  No further bleeding.  Dressing intact on the lower extremities.  No chest pain or shortness of breath.    Objective:   Vitals:   06/07/22 0900 06/07/22 0905 06/07/22 1008 06/07/22 1037  BP:    99/63  Pulse: 100 100  92  Resp: (!) 21 20 18 14   Temp:    97.7 F (36.5 C)  TempSrc:    Oral  SpO2:      Weight:      Height:        Intake/Output Summary (Last 24 hours) at 06/07/2022 1158 Last data filed at 06/07/2022 1100 Gross per 24 hour  Intake  1879.84 ml  Output --  Net 1879.84 ml     Wt Readings from Last 3 Encounters:  06/07/22 90.8 kg  02/10/22 99.5 kg  02/01/22 86.2 kg  Physical Exam General: Alert and oriented x 3, NAD Cardiovascular: S1 S2 clear, RRR.  Respiratory: CTAB, no wheezing Gastrointestinal: Soft, nontender, nondistended, NBS Ext: 2+ pedal edema bilaterally Psych: Normal affect  Skin: Pic below from 5/20, bilateral lower extremity edema, erythema, lymphedema with chronic venous stasis    Data Reviewed:  I have personally reviewed following labs    CBC Lab Results  Component Value Date   WBC 9.6 06/07/2022   RBC 3.09 (L) 06/07/2022   HGB 9.7 (L) 06/07/2022   HCT 29.2 (L) 06/07/2022   MCV 94.5 06/07/2022   MCH 31.4 06/07/2022   PLT 228 06/07/2022   MCHC 33.2 06/07/2022   RDW 15.3 06/07/2022   LYMPHSABS 0.7 06/03/2022   MONOABS 1.2 (H) 06/03/2022   EOSABS 0.1 06/03/2022   BASOSABS 0.1 06/03/2022     Last metabolic panel Lab Results  Component Value Date   NA 135 06/07/2022   K 3.4 (L) 06/07/2022   CL 102 06/07/2022   CO2 23 06/07/2022   BUN 30 (H) 06/07/2022   CREATININE 1.25 (H) 06/07/2022   GLUCOSE 106 (H) 06/07/2022   GFRNONAA 47 (L) 06/07/2022   GFRAA >60 07/18/2019   CALCIUM 9.8 06/07/2022   PROT 5.9 (L) 06/03/2022   ALBUMIN 2.3 (L) 06/03/2022   LABGLOB 2.5 12/02/2021   AGRATIO 1.6 12/02/2021   BILITOT 0.4 06/03/2022   ALKPHOS 305 (H) 06/03/2022   AST 29 06/03/2022   ALT 44 06/03/2022   ANIONGAP 10 06/07/2022    CBG (last 3)  Recent Labs    06/05/22 0518 06/06/22 0621 06/07/22 0624  GLUCAP 102* 98 107*      Coagulation Profile: No results for input(s): "INR", "PROTIME" in the last 168 hours.   Radiology Studies: I have personally reviewed the imaging studies  No results found.     Thad Ranger M.D. [facility] 06/07/2022, 11:58 AM  Available via Epic secure chat 7am-7pm After 7 pm, please refer to night coverage provider listed on amion.

## 2022-06-07 NOTE — TOC Progression Note (Signed)
Transition of Care [facility]) - Progression Note    Patient Details  Name: Christine Jacobson MRN: [id number] Date of Birth: Aug 09, 1955  Transition of Care [facility]) CM/SW Contact  Carley Hammed, LCSW Phone Number: 06/07/2022, 12:08 PM  Clinical Narrative:     CSW notified by medical team that pt and team have concerns about pt returning home with [facility]. CSW spoke with pt at bedside. She is short and minimally willing to participate. CSW attempted assessment and pt became agitated and stated "just whatever is best". Pt asked if she could just stay in the hospital, or go to inpatient rehab, CSW attempted to educate pt to medicare criteria for admission. Pt indicated that the conversation was over.  CSW updated medical team to barriers. They confirmed that pt does not need any long term IV ABX or other medical qualifiers. CSW will complete workup and attempt insurance authorization. RNCM to assist with additional services if not approved for SNF. TOC will continue to follow for DC needs.   Expected Discharge Plan: Home w Home Health Services Barriers to Discharge: Continued Medical Work up  Expected Discharge Plan and Services       Living arrangements for the past 2 months: Single Family Home                           HH Arranged: PT, OT, RN [facility] Agency: [facility] Date [facility] Agency Contacted: 06/03/22 Time HH Agency Contacted: 1300 Representative spoke with at [facility] Agency: Kandee Keen   Social Determinants of Health (SDOH) Interventions SDOH Screenings   Food Insecurity: Food Insecurity Present (06/02/2022)  Housing: Low Risk  (06/02/2022)  Transportation Needs: No Transportation Needs (06/02/2022)  Utilities: Not At Risk (06/02/2022)  Depression (PHQ2-9): Low Risk  (10/26/2021)  Financial Resource Strain: Low Risk  (12/03/2021)  Tobacco Use: Medium Risk (06/02/2022)    Readmission Risk Interventions     No data to display

## 2022-06-07 NOTE — Progress Notes (Signed)
Vascular department contacted at [phone] for plan to complete ordered ABI's. Updated that ABI's unable to be completed with weeping wounds to BLE. Instructed to contact department back once wounds healed and patient pain is able to tolerate cuff inflation.

## 2022-06-07 NOTE — NC FL2 (Signed)
[place] MEDICAID FL2 LEVEL OF CARE FORM     IDENTIFICATION  Patient Name: Christine Jacobson Birthdate: July 31, 1955 Sex: female Admission Date (Current Location): 06/02/2022  [facility] and [place] Number:  Producer, television/film/video and Address:  [facility], [address], [place], [place] [address]      Provider Number: [id number]  Attending Physician Name and Address:  Cathren Harsh, MD  Relative Name and Phone Number:       Current Level of Care: Hospital Recommended Level of Care: Skilled Nursing Facility Prior Approval Number:    Date Approved/Denied:   PASRR Number: [id number]  Discharge Plan: SNF    Current Diagnoses: Patient Active Problem List   Diagnosis Date Noted   Cellulitis 06/02/2022   Sepsis due to cellulitis (HCC) 06/02/2022   ARF (acute renal failure) (HCC) 06/02/2022   Hyponatremia 06/02/2022   Dehydration 06/02/2022   Metabolic acidosis 06/02/2022   Other secondary pulmonary hypertension (HCC)    Cellulitis and abscess of left lower extremity 10/13/2021   Persistent atrial fibrillation (HCC) 07/28/2021   Obesity    Liver function test abnormality    Primary hypertension    Chronic diastolic heart failure (HCC) 12/15/2016   HOCM (hypertrophic obstructive cardiomyopathy) (HCC) 01/22/2016   Lower extremity edema 09/17/2014   History of syncope 12/05/2013   Pneumonia 11/23/2012   Shortness of breath 07/11/2011   Hypertension 11/23/2010   Chest pain 10/18/2010   Murmur 10/18/2010   Palpitations 09/23/2010   Mild hyperlipidemia    Elevated BP    Hyperprolactinemia (HCC)    Pyoderma gangrenosa     Orientation RESPIRATION BLADDER Height & Weight     Self, Time, Situation, Place  Normal Continent Weight: 200 lb 2.8 oz (90.8 kg) Height:  5\' 4"  (162.6 cm)  BEHAVIORAL SYMPTOMS/MOOD NEUROLOGICAL BOWEL NUTRITION STATUS      Continent Diet (See DC summary)  AMBULATORY STATUS COMMUNICATION OF NEEDS Skin   Supervision Verbally  Skin abrasions (Bilateral pretibial cellulitis, PI sacrum)                       Personal Care Assistance Level of Assistance  Bathing, Feeding, Dressing Bathing Assistance: Limited assistance Feeding assistance: Independent Dressing Assistance: Limited assistance     Functional Limitations Info  Sight, Hearing, Speech Sight Info: Impaired Hearing Info: Adequate Speech Info: Adequate    SPECIAL CARE FACTORS FREQUENCY  PT (By licensed PT), OT (By licensed OT)     PT Frequency: 5x week OT Frequency: 5x week            Contractures Contractures Info: Not present    Additional Factors Info  Code Status, Allergies, Psychotropic Code Status Info: DNR Allergies Info: Beef-derived Products  Chicken Protein  Fish-derived Products  Pork-derived Products Psychotropic Info: Sertraline         Current Medications (06/07/2022):  This is the current hospital active medication list Current Facility-Administered Medications  Medication Dose Route Frequency Provider Last Rate Last Admin   acetaminophen (TYLENOL) tablet 650 mg  650 mg Oral Q6H PRN Rodolph Bong, MD   650 mg at 06/07/22 5621   Or   acetaminophen (TYLENOL) suppository 650 mg  650 mg Rectal Q6H PRN Rodolph Bong, MD       ceFEPIme (MAXIPIME) 2 g in sodium chloride 0.9 % 100 mL IVPB  2 g Intravenous Q12H Carney, Gwenlyn Found, RPH   Paused at 06/07/22 0547   diphenhydrAMINE (BENADRYL) capsule 25 mg  25 mg Oral Q8H PRN Howerter, Justin B, DO   25 mg at 06/02/22 2258   furosemide (LASIX) injection 40 mg  40 mg Intravenous Q12H Rai, Ripudeep K, MD   40 mg at 06/07/22 0923   heparin ADULT infusion 100 units/mL (25000 units/247mL)  1,150 Units/hr Intravenous Continuous Mosetta Anis, RPH 11.5 mL/hr at 06/07/22 1100 1,150 Units/hr at 06/07/22 1100   hydrALAZINE (APRESOLINE) injection 10 mg  10 mg Intravenous Q6H PRN Rodolph Bong, MD       hydrocortisone cream 1 % 1 Application  1 Application Topical TID PRN  Rodolph Bong, MD       HYDROmorphone (DILAUDID) injection 0.5-1 mg  0.5-1 mg Intravenous Q2H PRN Rodolph Bong, MD   0.5 mg at 06/06/22 1930   levalbuterol (XOPENEX) nebulizer solution 0.63 mg  0.63 mg Nebulization Q6H PRN Rodolph Bong, MD       LORazepam (ATIVAN) tablet 0.5 mg  0.5 mg Oral Q8H PRN Rai, Ripudeep K, MD   0.5 mg at 06/05/22 2308   melatonin tablet 3 mg  3 mg Oral QHS Rai, Ripudeep K, MD   3 mg at 06/06/22 2148   metoprolol succinate (TOPROL-XL) 24 hr tablet 12.5 mg  12.5 mg Oral Daily Rai, Ripudeep K, MD   12.5 mg at 06/07/22 0922   ondansetron (ZOFRAN) tablet 4 mg  4 mg Oral Q6H PRN Rodolph Bong, MD       Or   ondansetron [facility]) injection 4 mg  4 mg Intravenous Q6H PRN Rodolph Bong, MD   4 mg at 06/07/22 1610   oxyCODONE (Oxy IR/ROXICODONE) immediate release tablet 5 mg  5 mg Oral Q4H PRN Rodolph Bong, MD   5 mg at 06/06/22 2256   pneumococcal 20-valent conjugate vaccine (PREVNAR 20) injection 0.5 mL  0.5 mL Intramuscular Tomorrow-1000 Rodolph Bong, MD       polyethylene glycol (MIRALAX / GLYCOLAX) packet 17 g  17 g Oral Daily PRN Rodolph Bong, MD       rosuvastatin (CRESTOR) tablet 20 mg  20 mg Oral Daily Rai, Ripudeep K, MD   20 mg at 06/07/22 9604   sertraline (ZOLOFT) tablet 50 mg  50 mg Oral QHS Rodolph Bong, MD   50 mg at 06/06/22 2148   sodium chloride flush (NS) 0.9 % injection 3 mL  3 mL Intravenous Q12H Rodolph Bong, MD   3 mL at 06/07/22 0924   sorbitol 70 % solution 30 mL  30 mL Oral Daily PRN Rodolph Bong, MD       triamcinolone cream (KENALOG) 0.1 % cream   Topical Daily Rai, Ripudeep K, MD   Given at 06/07/22 0924   vancomycin (VANCOREADY) IVPB 750 mg/150 mL  750 mg Intravenous Q24H Ulyses Southward Q, RPH-CPP   Stopped at 06/07/22 5409     Discharge Medications: Please see discharge summary for a list of discharge medications.  Relevant Imaging Results:  Relevant Lab Results:   Additional  Information SS# [id number] [place]

## 2022-06-07 NOTE — Plan of Care (Signed)
  Problem: Education: Goal: Knowledge of General Education information will improve Description: Including pain rating scale, medication(s)/side effects and non-pharmacologic comfort measures Outcome: Progressing   Problem: Health Behavior/Discharge Planning: Goal: Ability to manage health-related needs will improve Outcome: Progressing   Problem: Clinical Measurements: Goal: Ability to maintain clinical measurements within normal limits will improve Outcome: Progressing Goal: Diagnostic test results will improve Outcome: Progressing Goal: Respiratory complications will improve Outcome: Progressing Goal: Cardiovascular complication will be avoided Outcome: Progressing   Problem: Activity: Goal: Risk for activity intolerance will decrease Outcome: Progressing   Problem: Nutrition: Goal: Adequate nutrition will be maintained Outcome: Progressing   Problem: Coping: Goal: Level of anxiety will decrease Outcome: Progressing   Problem: Elimination: Goal: Will not experience complications related to urinary retention Outcome: Progressing   Problem: Safety: Goal: Ability to remain free from injury will improve Outcome: Progressing

## 2022-06-07 NOTE — Progress Notes (Signed)
Physical Therapy Treatment Patient Details Name: Christine Jacobson MRN: [id number] DOB: 03/20/1955 Today's Date: 06/07/2022   History of Present Illness 67 y.o. female admitted on 06/02/2022 from PCP office with L LE wound infection.  Presented with bilateral leg edema with left lower leg weeping. WBC12.3, lactate 2.2, afebrile, HR 80s, RR <20/min, and BP now 100s systolic. Currently with significant AKI, Scr 2.16 - baseline 0.91. PMH: A-fib on Eliquis, HOCM, CHF    PT Comments    Pt with flat affect, continues to require increased time with transitions and activity. Pt able to walk in hall but fatigues with increased activity and denied additional LB HEP this date. Pt could benefit from brief continued inpatient follow up therapy, <3 hours/day to maximize activity tolerance and progression to achieve mod I for return home. Will continue to follow acutely with encouragement for continued ambulation and mobility trials daily.     Recommendations for follow up therapy are one component of a multi-disciplinary discharge planning process, led by the attending physician.  Recommendations may be updated based on patient status, additional functional criteria and insurance authorization.  Follow Up Recommendations       Assistance Recommended at Discharge PRN  Patient can return home with the following Assist for transportation;Assistance with cooking/housework   Equipment Recommendations  None recommended by PT    Recommendations for Other Services       Precautions / Restrictions Precautions Precautions: Fall Precaution Comments: bil LE wounds     Mobility  Bed Mobility               General bed mobility comments: in chair on arrival and end of session declined bed    Transfers Overall transfer level: Needs assistance   Transfers: Sit to/from Stand Sit to Stand: Supervision           General transfer comment: cues for hand placement. Pt able to rise from recliner and  toilet then perform 5 repeated STS with cues. Static standing at sink to brush teeth and wash hands    Ambulation/Gait Ambulation/Gait assistance: Min guard Gait Distance (Feet): 150 Feet Assistive device: Rolling walker (2 wheels) Gait Pattern/deviations: Step-through pattern, Decreased stride length   Gait velocity interpretation: <1.8 ft/sec, indicate of risk for recurrent falls   General Gait Details: cues for posture, proximity to rW and direction, 3 standing rest breaks. Pt walked 150' then additional 30' after toileting   Stairs             Wheelchair Mobility    Modified Rankin (Stroke Patients Only)       Balance Overall balance assessment: Needs assistance Sitting-balance support: Feet supported Sitting balance-Leahy Scale: Fair     Standing balance support: Bilateral upper extremity supported, During functional activity, Reliant on assistive device for balance Standing balance-Leahy Scale: Poor Standing balance comment: Rw in standing                            Cognition Arousal/Alertness: Awake/alert Behavior During Therapy: Flat affect Overall Cognitive Status: Within Functional Limits for tasks assessed                                          Exercises      General Comments        Pertinent Vitals/Pain Pain Assessment Pain Score: 4  Pain Location: bil LE  Pain Descriptors / Indicators: Sore Pain Intervention(s): Limited activity within patient's tolerance, Monitored during session, Repositioned    Home Living                          Prior Function            PT Goals (current goals can now be found in the care plan section) Progress towards PT goals: Progressing toward goals    Frequency    Min 1X/week      PT Plan Current plan remains appropriate    Co-evaluation              AM-PAC PT "6 Clicks" Mobility   Outcome Measure  Help needed turning from your back to your side  while in a flat bed without using bedrails?: None Help needed moving from lying on your back to sitting on the side of a flat bed without using bedrails?: None Help needed moving to and from a bed to a chair (including a wheelchair)?: A Little Help needed standing up from a chair using your arms (e.g., wheelchair or bedside chair)?: A Little Help needed to walk in hospital room?: A Little Help needed climbing 3-5 steps with a railing? : A Lot 6 Click Score: 19    End of Session   Activity Tolerance: Patient tolerated treatment well Patient left: in chair;with call bell/phone within reach Nurse Communication: Mobility status PT Visit Diagnosis: Other abnormalities of gait and mobility (R26.89);Muscle weakness (generalized) (M62.81);Difficulty in walking, not elsewhere classified (R26.2);Pain     Time: 1610-9604 PT Time Calculation (min) (ACUTE ONLY): 34 min  Charges:  $Gait Training: 8-22 mins $Therapeutic Activity: 8-22 mins                     Christine Jacobson, PT Acute Rehabilitation Services Office: [phone]    Christine Jacobson Christine Jacobson 06/07/2022, 1:54 PM

## 2022-06-07 NOTE — Progress Notes (Addendum)
ANTICOAGULATION CONSULT NOTE  Pharmacy Consult for IV heparin (holding Eliquis) Indication: atrial fibrillation  Allergies  Allergen Reactions   Beef-Derived Products     Does not eat pork d/t religious reasons, but okay with receiving heparin.  Discussed with patient 06/05/22   Chicken Protein    Fish-Derived Products    Pork-Derived Products     Patient Measurements: Height: 5\' 4"  (162.6 cm) Weight: 90.8 kg (200 lb 2.8 oz) IBW/kg (Calculated) : 54.7   Vital Signs: Temp: 98.2 F (36.8 C) (05/21 0805) Temp Source: Oral (05/21 0805) BP: 95/61 (05/21 0805) Pulse Rate: 100 (05/21 0905)  Labs: Recent Labs    06/05/22 0017 06/06/22 0757 06/06/22 0923 06/06/22 2011 06/07/22 0654  HGB 10.9* 11.0*  --   --  9.7*  HCT 32.5* 33.4*  --   --  29.2*  PLT 261 254  --   --  228  APTT  --   --  40* 59* 108*  HEPARINUNFRC  --  >1.10*  --  >1.10* >1.10*  CREATININE 1.41* 1.29*  --   --  1.25*     Estimated Creatinine Clearance: 46.3 mL/min (A) (by C-G formula based on SCr of 1.25 mg/dL (H)).   Medical History: Past Medical History:  Diagnosis Date   Chest pain 11/22/2010   2D STRESS ECHO - EF 60%, peak stress EF 80%, normal, no evidence for stress-induced ischemia   HOCM (hypertrophic obstructive cardiomyopathy) (HCC) 06/21/2011   2D ECHO - EF >55%, normal   HTN (hypertension)    Hyperprolactinemia (HCC)    dx in her 18, took meds, self d/c a while back   Liver function test abnormality    normal when repeated   Mild hyperlipidemia    Obesity    Palpitations    negative stress echo in November 2012 with normal LV function; mild LVH, proximal septal thickening with narrow LVOT and mild gradient; mild MR and TR; Cardionet showed PACs in November 2012   Pyoderma gangrenosa    Shortness of breath 07/11/2011   MET TEST    Medications:  Infusions:   ceFEPime (MAXIPIME) IV Stopped (06/07/22 0547)   heparin 1,250 Units/hr (06/07/22 0900)   vancomycin Stopped (06/07/22 1610)     Assessment: 67 yo female on chronic Eliquis for afib, now with bleeding wounds.  Pharmacy asked to hold Eliquis and start IV heparin.  Of note - pt has allergy in chart to pork derived products.  I discussed with pt, she does not eat pork due to religious reasons, but is not opposed to receiving IV heparin, which is pork derived.  aPTT this morning is slightly above goal at 110 seconds - no further bleeding from wounds at this time, CBC ok.   Goal of Therapy:  Heparin level 0.3-0.7 units/ml Monitor platelets by anticoagulation protocol: Yes   Plan:  Reduce heparin to 1150 units/h Recheck aPTT, heparin level, CBC in am  Fredonia Highland, PharmD, BCPS, [facility] Clinical Pharmacist [phone] Please check AMION for all [facility] Pharmacy numbers 06/07/2022

## 2022-06-08 DIAGNOSIS — L0390 Cellulitis, unspecified: Secondary | ICD-10-CM | POA: Diagnosis not present

## 2022-06-08 DIAGNOSIS — A419 Sepsis, unspecified organism: Secondary | ICD-10-CM | POA: Diagnosis not present

## 2022-06-08 LAB — CBC
HCT: 29.4 % — ABNORMAL LOW (ref 36.0–46.0)
Hemoglobin: 9.5 g/dL — ABNORMAL LOW (ref 12.0–15.0)
MCH: 30.7 pg (ref 26.0–34.0)
MCHC: 32.3 g/dL (ref 30.0–36.0)
MCV: 95.1 fL (ref 80.0–100.0)
Platelets: 228 10*3/uL (ref 150–400)
RBC: 3.09 MIL/uL — ABNORMAL LOW (ref 3.87–5.11)
RDW: 15.3 % (ref 11.5–15.5)
WBC: 8.4 10*3/uL (ref 4.0–10.5)
nRBC: 0 % (ref 0.0–0.2)

## 2022-06-08 LAB — HEPARIN LEVEL (UNFRACTIONATED): Heparin Unfractionated: >1.1 IU/mL — ABNORMAL HIGH (ref 0.30–0.70)

## 2022-06-08 LAB — COMPREHENSIVE METABOLIC PANEL
ALT: 39 U/L (ref 0–44)
AST: 45 U/L — ABNORMAL HIGH (ref 15–41)
Albumin: 2.9 g/dL — ABNORMAL LOW (ref 3.5–5.0)
Alkaline Phosphatase: 290 U/L — ABNORMAL HIGH (ref 38–126)
Anion gap: 7 (ref 5–15)
BUN: 30 mg/dL — ABNORMAL HIGH (ref 8–23)
CO2: 24 mmol/L (ref 22–32)
Calcium: 9.5 mg/dL (ref 8.9–10.3)
Chloride: 102 mmol/L (ref 98–111)
Creatinine, Ser: 1.4 mg/dL — ABNORMAL HIGH (ref 0.44–1.00)
GFR, Estimated: 41 mL/min — ABNORMAL LOW (ref >60)
Glucose, Bld: 110 mg/dL — ABNORMAL HIGH (ref 70–99)
Potassium: 3.1 mmol/L — ABNORMAL LOW (ref 3.5–5.1)
Sodium: 133 mmol/L — ABNORMAL LOW (ref 135–145)
Total Bilirubin: 0.8 mg/dL (ref 0.3–1.2)
Total Protein: 6 g/dL — ABNORMAL LOW (ref 6.5–8.1)

## 2022-06-08 LAB — GLUCOSE, CAPILLARY: Glucose-Capillary: 100 mg/dL — ABNORMAL HIGH (ref 70–99)

## 2022-06-08 LAB — APTT: aPTT: 105 seconds — ABNORMAL HIGH (ref 24–36)

## 2022-06-08 MED ORDER — POTASSIUM CHLORIDE CRYS ER 20 MEQ PO TBCR
40.0000 meq | EXTENDED_RELEASE_TABLET | Freq: Two times a day (BID) | ORAL | Status: AC
  Administered 2022-06-08 (×2): 40 meq via ORAL
  Filled 2022-06-08 (×2): qty 2

## 2022-06-08 MED ORDER — ALBUMIN HUMAN 25 % IV SOLN
25.0000 g | Freq: Four times a day (QID) | INTRAVENOUS | Status: AC
  Administered 2022-06-08 (×2): 25 g via INTRAVENOUS
  Filled 2022-06-08 (×2): qty 100

## 2022-06-08 MED ORDER — CEPHALEXIN 250 MG PO CAPS
250.0000 mg | ORAL_CAPSULE | Freq: Four times a day (QID) | ORAL | Status: DC
  Administered 2022-06-08 – 2022-06-09 (×4): 250 mg via ORAL
  Filled 2022-06-08 (×7): qty 1

## 2022-06-08 MED ORDER — VITAMIN C 500 MG PO TABS
500.0000 mg | ORAL_TABLET | Freq: Every day | ORAL | Status: DC
  Administered 2022-06-08 – 2022-06-09 (×2): 500 mg via ORAL
  Filled 2022-06-08 (×2): qty 1

## 2022-06-08 MED ORDER — MIDODRINE HCL 5 MG PO TABS
5.0000 mg | ORAL_TABLET | Freq: Two times a day (BID) | ORAL | Status: DC
  Administered 2022-06-08 – 2022-06-09 (×3): 5 mg via ORAL
  Filled 2022-06-08 (×3): qty 1

## 2022-06-08 MED ORDER — ZINC SULFATE 220 (50 ZN) MG PO CAPS
220.0000 mg | ORAL_CAPSULE | Freq: Every day | ORAL | Status: DC
  Administered 2022-06-08 – 2022-06-09 (×2): 220 mg via ORAL
  Filled 2022-06-08 (×2): qty 1

## 2022-06-08 MED ORDER — ORAL CARE MOUTH RINSE: 15.0000 mL | OROMUCOSAL | Status: DC | PRN

## 2022-06-08 MED ORDER — APIXABAN 5 MG PO TABS
5.0000 mg | ORAL_TABLET | Freq: Two times a day (BID) | ORAL | Status: DC
  Administered 2022-06-08 – 2022-06-09 (×3): 5 mg via ORAL
  Filled 2022-06-08 (×3): qty 1

## 2022-06-08 NOTE — Plan of Care (Signed)
  Problem: Education: Goal: Knowledge of General Education information will improve Description: Including pain rating scale, medication(s)/side effects and non-pharmacologic comfort measures Outcome: Progressing   Problem: Health Behavior/Discharge Planning: Goal: Ability to manage health-related needs will improve Outcome: Progressing   Problem: Clinical Measurements: Goal: Diagnostic test results will improve Outcome: Progressing Goal: Respiratory complications will improve Outcome: Progressing Goal: Cardiovascular complication will be avoided Outcome: Progressing   Problem: Activity: Goal: Risk for activity intolerance will decrease Outcome: Progressing   Problem: Coping: Goal: Level of anxiety will decrease Outcome: Progressing   Problem: Elimination: Goal: Will not experience complications related to bowel motility Outcome: Progressing Goal: Will not experience complications related to urinary retention Outcome: Progressing   Problem: Pain Managment: Goal: General experience of comfort will improve Outcome: Progressing

## 2022-06-08 NOTE — Progress Notes (Signed)
Occupational Therapy Treatment Patient Details Name: Christine Jacobson MRN: [id number] DOB: 05/15/1955 Today's Date: 06/08/2022   History of present illness 67 y.o. female admitted on 06/02/2022 from PCP office with L LE wound infection.  Presented with bilateral leg edema with left lower leg weeping. WBC12.3, lactate 2.2, afebrile, HR 80s, RR <20/min, and BP now 100s systolic. Currently with significant AKI, Scr 2.16 - baseline 0.91. PMH: A-fib on Eliquis, HOCM, CHF   OT comments  Pt limited by nausea/BLE/and headache pain this morning. Able to attempt LB dressing and needing mod A for sock mgmt, min guard A for standing with RW for repositioning in chair, pt declines further mobility. Discussed trial of AE for LB ADL but pt declines at this time. Pt presenting with impairments listed below, will follow acutely. Continue to recommend HHOT at d/c.    Recommendations for follow up therapy are one component of a multi-disciplinary discharge planning process, led by the attending physician.  Recommendations may be updated based on patient status, additional functional criteria and insurance authorization.    Assistance Recommended at Discharge Set up Supervision/Assistance  Patient can return home with the following  A little help with walking and/or transfers;A little help with bathing/dressing/bathroom;Assist for transportation   Equipment Recommendations  BSC/3in1    Recommendations for Other Services      Precautions / Restrictions Precautions Precautions: Fall Precaution Comments: bil LE wounds Restrictions Weight Bearing Restrictions: No       Mobility Bed Mobility Overal bed mobility: Needs Assistance Bed Mobility: Supine to Sit     Supine to sit: HOB elevated, Min guard          Transfers Overall transfer level: Needs assistance Equipment used: Rolling walker (2 wheels) Transfers: Sit to/from Stand Sit to Stand: Min guard                 Balance Overall balance  assessment: Needs assistance Sitting-balance support: Feet supported Sitting balance-Leahy Scale: Fair     Standing balance support: Bilateral upper extremity supported, During functional activity, Reliant on assistive device for balance Standing balance-Leahy Scale: Poor Standing balance comment: Rw in standing                           ADL either performed or assessed with clinical judgement   ADL Overall ADL's : Needs assistance/impaired                     Lower Body Dressing: Moderate assistance;Sitting/lateral leans Lower Body Dressing Details (indicate cue type and reason): attempts to pull leg up into figure 4 but unable, fatigues quickly and declines trial of AE Toilet Transfer: Min guard;Ambulation;Rolling walker (2 wheels);Regular Teacher, adult education Details (indicate cue type and reason): guarding for RW and hand placement         Functional mobility during ADLs: Min guard;Rolling walker (2 wheels)      Extremity/Trunk Assessment Upper Extremity Assessment Upper Extremity Assessment: Overall WFL for tasks assessed   Lower Extremity Assessment Lower Extremity Assessment: Defer to PT evaluation        Vision       Perception Perception Perception: Not tested   Praxis Praxis Praxis: Not tested    Cognition Arousal/Alertness: Awake/alert Behavior During Therapy: Flat affect Overall Cognitive Status: Within Functional Limits for tasks assessed  General Comments: frustrated with nausea/pain/headache        Exercises      Shoulder Instructions       General Comments VSS on RA    Pertinent Vitals/ Pain       Pain Assessment Pain Assessment: Faces Pain Score: 1  Pain Location: BLE/headache/nausea Pain Descriptors / Indicators: Discomfort Pain Intervention(s): Limited activity within patient's tolerance, Monitored during session, Repositioned  Home Living                                           Prior Functioning/Environment              Frequency  Min 2X/week        Progress Toward Goals  OT Goals(current goals can now be found in the care plan section)  Progress towards OT goals: Progressing toward goals  Acute Rehab OT Goals Patient Stated Goal: none stated OT Goal Formulation: With patient Time For Goal Achievement: 06/17/22 Potential to Achieve Goals: Good ADL Goals Pt Will Perform Lower Body Dressing: with modified independence;with adaptive equipment;sit to/from stand Pt Will Transfer to Toilet: with modified independence;ambulating;bedside commode Pt/caregiver will Perform Home Exercise Program: Increased strength;With minimal assist;With written HEP provided Additional ADL Goal #1: pt will complete adl with 2 energy conservation strageties from packet mod i  Plan Discharge plan remains appropriate    Co-evaluation                 AM-PAC OT "6 Clicks" Daily Activity     Outcome Measure   Help from another person eating meals?: None Help from another person taking care of personal grooming?: A Little Help from another person toileting, which includes using toliet, bedpan, or urinal?: A Little Help from another person bathing (including washing, rinsing, drying)?: A Lot Help from another person to put on and taking off regular upper body clothing?: A Little Help from another person to put on and taking off regular lower body clothing?: A Lot 6 Click Score: 17    End of Session Equipment Utilized During Treatment: Rolling walker (2 wheels)  OT Visit Diagnosis: Unsteadiness on feet (R26.81);Muscle weakness (generalized) (M62.81)   Activity Tolerance Patient limited by fatigue   Patient Left in chair;with call bell/phone within reach   Nurse Communication Mobility status        Time: 0803-0820 OT Time Calculation (min): 17 min  Charges: OT General Charges $OT Visit: 1 Visit OT Treatments $Self  Care/Home Management : 8-22 mins  Carver Fila, OTD, OTR/L SecureChat Preferred Acute Rehab [phone]   Carver Fila Koonce 06/08/2022, 10:24 AM

## 2022-06-08 NOTE — Progress Notes (Addendum)
PROGRESS NOTE    Christine Jacobson  WUJ:[id number] DOB: 14-Aug-1955 DOA: 06/02/2022 PCP: Ceasar Lund, PA  67/F with chronic diastolic CHF, HOCM, paroxysmal A-fib on Eliquis, chronic lower extremity wounds followed at the wound center previously, hypertension, dyslipidemia, obesity was sent to the ED from PCPs office due to concerns regarding worsening state of her wounds, previously managed at the wound care center for the last several months, now self treating with Neosporin.  Also intermittent shortness of breath X 1 week -In the ED also noted to be edematous, low blood pressures -CBC with white count 12.3 with left shift, lactic acid 2.2. X-ray tib-fib showed moderate dorsal midfoot soft tissue swelling -Admitted, started on antibiotics and diuretics  Subjective: -Upset that I insisted on opening her dressings to evaluate her wounds, breathing better not back to baseline  Assessment and Plan:  Sepsis, POA Chronic wounds with secondary infection, cellulitis -On admission BP was soft initially with lactic acidosis and AKI, now resolved -Blood cultures negative, no fever, leukocytosis has resolved -Has been on IV vancomycin and cefepime X day 7 now -Transition to oral Keflex for 3 more days to complete 10-day course -Wound RN consulting, continue daily local wound care, add vitamin C and zinc -Encouraged patient to go back to the wound center weekly, will also set up home health RN at discharge  AKI -Creatinine 2.1 on admission, from baseline of 0.8 -Now improving, renal ultrasound negative for hydronephrosis  Adnexal cystic masses -Incidentally noted on renal ultrasound, will need GYN follow-up and further workup for this  Acute on chronic diastolic CHF/S HOCM Severe hypoalbuminemia with third spacing -Albumin was 2.3 on 5/17 -Remains volume overloaded, repeat IV Lasix with albumin today -Add midodrine -Dietitian consult  Paroxysmal atrial fibrillation -Currently in sinus  rhythm, continue Toprol, DC heparin and resume Eliquis  Hypokalemia -Replaced  Elevated alk phos -Noted since 2023, recommend PCP trend this and consider GI follow-up, no abdominal symptoms at this time  Obesity -BMI is 34.2, needs lifestyle modification   DVT prophylaxis: Eliquis Code Status: DNR Family Communication: No family at bedside Disposition Plan: Home likely 1 to 2 days  Consultants:    Procedures:   Antimicrobials:    Objective: Vitals:   06/08/22 0351 06/08/22 0617 06/08/22 0725 06/08/22 0814  BP: 119/86  (!) 87/61 102/70  Pulse:    92  Resp: 16  13 13   Temp: 98 F (36.7 C)  97.7 F (36.5 C) 97.8 F (36.6 C)  TempSrc: Oral  Oral Oral  SpO2:    96%  Weight:  90.5 kg    Height:        Intake/Output Summary (Last 24 hours) at 06/08/2022 1056 Last data filed at 06/08/2022 0000 Gross per 24 hour  Intake 742.21 ml  Output 0 ml  Net 742.21 ml   Filed Weights   06/06/22 0500 06/07/22 0403 06/08/22 0617  Weight: 90.4 kg 90.8 kg 90.5 kg    Examination:  General exam: Obese chronically ill female sitting up in the recliner, AAOx3, angry and irritable HEENT: Neck obese unable to assess JVD CVS: S1-S2, regular rhythm Lungs: Diminished breath sounds at the bases otherwise clear Abdomen: Soft, obese, nontender, bowel sounds present Extremities: 1-2+ edema bilaterally, both lower legs with full-thickness wounds on the posterior aspect with serosanguineous drainage  Skin: As above Psychiatry: Flat affect, irritable    Data Reviewed:   CBC: Recent Labs  Lab 06/02/22 1123 06/03/22 0028 06/04/22 0025 06/05/22 0017 06/06/22 0757 06/07/22 0654 06/08/22 0024  WBC 12.3* 12.9* 9.4 9.6 9.8 9.6 8.4  NEUTROABS 10.4* 10.8*  --   --   --   --   --   HGB 12.7 10.9* 11.3* 10.9* 11.0* 9.7* 9.5*  HCT 38.7 32.7* 33.7* 32.5* 33.4* 29.2* 29.4*  MCV 95.3 92.4 93.4 93.1 94.6 94.5 95.1  PLT 305 227 279 261 254 228 228   Basic Metabolic Panel: Recent Labs  Lab  06/04/22 0025 06/05/22 0017 06/06/22 0757 06/07/22 0654 06/08/22 0024  NA 131* 131* 134* 135 133*  K 4.8 3.4* 4.1 3.4* 3.1*  CL 100 100 103 102 102  CO2 23 21* 21* 23 24  GLUCOSE 101* 115* 167* 106* 110*  BUN 42* 35* 30* 30* 30*  CREATININE 1.61* 1.41* 1.29* 1.25* 1.40*  CALCIUM 9.3 9.0 9.3 9.8 9.5   GFR: Estimated Creatinine Clearance: 41.3 mL/min (A) (by C-G formula based on SCr of 1.4 mg/dL (H)). Liver Function Tests: Recent Labs  Lab 06/03/22 0028 06/08/22 0024  AST 29 45*  ALT 44 39  ALKPHOS 305* 290*  BILITOT 0.4 0.8  PROT 5.9* 6.0*  ALBUMIN 2.3* 2.9*   No results for input(s): "LIPASE", "AMYLASE" in the last 168 hours. No results for input(s): "AMMONIA" in the last 168 hours. Coagulation Profile: No results for input(s): "INR", "PROTIME" in the last 168 hours. Cardiac Enzymes: No results for input(s): "CKTOTAL", "CKMB", "CKMBINDEX", "TROPONINI" in the last 168 hours. BNP (last 3 results) No results for input(s): "PROBNP" in the last 8760 hours. HbA1C: No results for input(s): "HGBA1C" in the last 72 hours. CBG: Recent Labs  Lab 06/04/22 0559 06/05/22 0518 06/06/22 0621 06/07/22 0624 06/08/22 0612  GLUCAP 89 102* 98 107* 100*   Lipid Profile: No results for input(s): "CHOL", "HDL", "LDLCALC", "TRIG", "CHOLHDL", "LDLDIRECT" in the last 72 hours. Thyroid Function Tests: No results for input(s): "TSH", "T4TOTAL", "FREET4", "T3FREE", "THYROIDAB" in the last 72 hours. Anemia Panel: No results for input(s): "VITAMINB12", "FOLATE", "FERRITIN", "TIBC", "IRON", "RETICCTPCT" in the last 72 hours. Urine analysis:    Component Value Date/Time   COLORURINE YELLOW 06/03/2022 0400   APPEARANCEUR HAZY (A) 06/03/2022 0400   LABSPEC 1.019 06/03/2022 0400   PHURINE 5.0 06/03/2022 0400   GLUCOSEU NEGATIVE 06/03/2022 0400   HGBUR SMALL (A) 06/03/2022 0400   BILIRUBINUR NEGATIVE 06/03/2022 0400   KETONESUR NEGATIVE 06/03/2022 0400   PROTEINUR 30 (A) 06/03/2022 0400    NITRITE NEGATIVE 06/03/2022 0400   LEUKOCYTESUR MODERATE (A) 06/03/2022 0400   Sepsis Labs: @LABRCNTIP (procalcitonin:4,lacticidven:4)  ) Recent Results (from the past 240 hour(s))  Culture, blood (routine x 2)     Status: None   Collection Time: 06/02/22 11:42 AM   Specimen: BLOOD  Result Value Ref Range Status   Specimen Description BLOOD LEFT ANTECUBITAL  Final   Special Requests   Final    BOTTLES DRAWN AEROBIC ONLY Blood Culture adequate volume   Culture   Final    NO GROWTH 5 DAYS Performed at [facility], [address], [place], [place] [address]    Report Status 06/07/2022 FINAL  Final  MRSA Next Gen by PCR, Nasal     Status: None   Collection Time: 06/02/22  5:51 PM   Specimen: Nasal Mucosa; Nasal Swab  Result Value Ref Range Status   MRSA by PCR Next Gen NOT DETECTED NOT DETECTED Final    Comment: (NOTE) The GeneXpert MRSA Assay (FDA approved for NASAL specimens only), is one component of a comprehensive MRSA colonization surveillance program. It  is not intended to diagnose MRSA infection nor to guide or monitor treatment for MRSA infections. Test performance is not FDA approved in patients less than 64 years old. Performed at [facility], [address], [place], [place] [address]   Urine Culture (for pregnant, neutropenic or urologic patients or patients with an indwelling urinary catheter)     Status: Abnormal   Collection Time: 06/03/22  4:00 AM   Specimen: Urine, Clean Catch  Result Value Ref Range Status   Specimen Description URINE, CLEAN CATCH  Final   Special Requests   Final    NONE Performed at [facility], [address], [place], [place] [address]    Culture MULTIPLE SPECIES PRESENT, SUGGEST RECOLLECTION (A)  Final   Report Status 06/04/2022 FINAL  Final     Radiology Studies: No results found.   Scheduled Meds:  ascorbic acid  500 mg Oral Daily   furosemide  40 mg Intravenous Q12H   melatonin  3 mg Oral QHS    metoprolol succinate  12.5 mg Oral Daily   pneumococcal 20-valent conjugate vaccine  0.5 mL Intramuscular Tomorrow-1000   potassium chloride  40 mEq Oral BID   rosuvastatin  20 mg Oral Daily   sertraline  50 mg Oral QHS   sodium chloride flush  3 mL Intravenous Q12H   triamcinolone cream   Topical Daily   zinc sulfate  220 mg Oral Daily   Continuous Infusions:  ceFEPime (MAXIPIME) IV 2 g (06/08/22 0544)   heparin 1,150 Units/hr (06/07/22 1900)   vancomycin 750 mg (06/08/22 0840)     LOS: 6 days    Time spent:    Zannie Cove, MD [facility]   06/08/2022, 10:56 AM

## 2022-06-09 ENCOUNTER — Encounter (HOSPITAL_COMMUNITY): Payer: Medicare Other

## 2022-06-09 ENCOUNTER — Other Ambulatory Visit (HOSPITAL_COMMUNITY): Payer: Self-pay

## 2022-06-09 DIAGNOSIS — A419 Sepsis, unspecified organism: Secondary | ICD-10-CM | POA: Diagnosis not present

## 2022-06-09 DIAGNOSIS — L0390 Cellulitis, unspecified: Secondary | ICD-10-CM | POA: Diagnosis not present

## 2022-06-09 LAB — CBC
HCT: 27.7 % — ABNORMAL LOW (ref 36.0–46.0)
Hemoglobin: 9 g/dL — ABNORMAL LOW (ref 12.0–15.0)
MCH: 30.5 pg (ref 26.0–34.0)
MCHC: 32.5 g/dL (ref 30.0–36.0)
MCV: 93.9 fL (ref 80.0–100.0)
Platelets: 200 10*3/uL (ref 150–400)
RBC: 2.95 MIL/uL — ABNORMAL LOW (ref 3.87–5.11)
RDW: 15.5 % (ref 11.5–15.5)
WBC: 9 10*3/uL (ref 4.0–10.5)
nRBC: 0 % (ref 0.0–0.2)

## 2022-06-09 LAB — MAGNESIUM: Magnesium: 1.8 mg/dL (ref 1.7–2.4)

## 2022-06-09 LAB — C-REACTIVE PROTEIN: CRP: 1.1 mg/dL — ABNORMAL HIGH (ref <1.0)

## 2022-06-09 LAB — BASIC METABOLIC PANEL
Anion gap: 9 (ref 5–15)
BUN: 26 mg/dL — ABNORMAL HIGH (ref 8–23)
CO2: 24 mmol/L (ref 22–32)
Calcium: 9.8 mg/dL (ref 8.9–10.3)
Chloride: 103 mmol/L (ref 98–111)
Creatinine, Ser: 1.24 mg/dL — ABNORMAL HIGH (ref 0.44–1.00)
GFR, Estimated: 47 mL/min — ABNORMAL LOW (ref >60)
Glucose, Bld: 92 mg/dL (ref 70–99)
Potassium: 4.5 mmol/L (ref 3.5–5.1)
Sodium: 136 mmol/L (ref 135–145)

## 2022-06-09 LAB — BRAIN NATRIURETIC PEPTIDE: B Natriuretic Peptide: 1459.6 pg/mL — ABNORMAL HIGH (ref 0.0–100.0)

## 2022-06-09 LAB — MRSA NEXT GEN BY PCR, NASAL: MRSA by PCR Next Gen: NOT DETECTED

## 2022-06-09 LAB — PROCALCITONIN: Procalcitonin: 0.11 ng/mL

## 2022-06-09 MED ORDER — ASCORBIC ACID 500 MG PO TABS
500.0000 mg | ORAL_TABLET | Freq: Every day | ORAL | 0 refills | Status: DC
  Filled 2022-06-09: qty 100, 100d supply, fill #0

## 2022-06-09 MED ORDER — HYDROCODONE-ACETAMINOPHEN 5-325 MG PO TABS
1.0000 | ORAL_TABLET | Freq: Two times a day (BID) | ORAL | 0 refills | Status: DC | PRN
  Filled 2022-06-09: qty 15, 7d supply, fill #0

## 2022-06-09 MED ORDER — ZINC SULFATE 220 (50 ZN) MG PO TABS
220.0000 mg | ORAL_TABLET | Freq: Every day | ORAL | 0 refills | Status: DC
  Filled 2022-06-09: qty 100, 100d supply, fill #0

## 2022-06-09 MED ORDER — POLYETHYLENE GLYCOL 3350 17 GM/SCOOP PO POWD
17.0000 g | Freq: Every day | ORAL | 0 refills | Status: DC | PRN
  Filled 2022-06-09: qty 238, 14d supply, fill #0

## 2022-06-09 MED ORDER — MIDODRINE HCL 5 MG PO TABS
5.0000 mg | ORAL_TABLET | Freq: Two times a day (BID) | ORAL | 0 refills | Status: DC
  Filled 2022-06-09: qty 60, 30d supply, fill #0

## 2022-06-09 MED ORDER — CEPHALEXIN 250 MG PO CAPS
250.0000 mg | ORAL_CAPSULE | Freq: Three times a day (TID) | ORAL | 0 refills | Status: DC
  Filled 2022-06-09: qty 30, 10d supply, fill #0

## 2022-06-09 MED ORDER — FUROSEMIDE 40 MG PO TABS
40.0000 mg | ORAL_TABLET | Freq: Two times a day (BID) | ORAL | 0 refills | Status: DC
  Filled 2022-06-09: qty 30, 15d supply, fill #0

## 2022-06-09 MED ORDER — POTASSIUM CHLORIDE CRYS ER 20 MEQ PO TBCR
20.0000 meq | EXTENDED_RELEASE_TABLET | Freq: Every day | ORAL | 0 refills | Status: DC
  Filled 2022-06-09: qty 20, 20d supply, fill #0

## 2022-06-09 MED ORDER — ACETAMINOPHEN 325 MG PO TABS
650.0000 mg | ORAL_TABLET | Freq: Four times a day (QID) | ORAL | 0 refills | Status: DC | PRN
  Filled 2022-06-09: qty 100, 13d supply, fill #0

## 2022-06-09 NOTE — TOC Transition Note (Signed)
Transition of Care [facility]) - CM/SW Discharge Note   Patient Details  Name: Christine Jacobson MRN: [id number] Date of Birth: 1955-11-24  Transition of Care [facility]) CM/SW Contact:  Gordy Clement, RN Phone Number: 06/09/2022, 10:01 AM   Clinical Narrative:    Patient to DC to home . [employer] will deliver a BSC commode to bedside prior to dc. Frances Furbish will be providing PT and OT Home Health AVS is updated  Family to transport       Barriers to Discharge: Continued Medical Work up   Patient Goals and CMS Choice      Discharge Placement                         Discharge Plan and Services Additional resources added to the After Visit Summary for                            HH Arranged: PT, OT, RN HH Agency: [facility] Date [facility] Agency Contacted: 06/03/22 Time HH Agency Contacted: 1300 Representative spoke with at [facility] Agency: Kandee Keen  Social Determinants of Health (SDOH) Interventions SDOH Screenings   Food Insecurity: Food Insecurity Present (06/02/2022)  Housing: Low Risk  (06/02/2022)  Transportation Needs: No Transportation Needs (06/02/2022)  Utilities: Not At Risk (06/02/2022)  Depression (PHQ2-9): Low Risk  (10/26/2021)  Financial Resource Strain: Low Risk  (12/03/2021)  Tobacco Use: Medium Risk (06/02/2022)     Readmission Risk Interventions     No data to display

## 2022-06-09 NOTE — Progress Notes (Signed)
PT Cancellation Note  Patient Details Name: Christine Jacobson MRN: [id number] DOB: 05/30/1955   Cancelled Treatment:    Reason Eval/Treat Not Completed: (P) Patient declined, no reason specified, pt declining all mobility despite encouragement, stating she has been up walking in room and is without questions/concerns. Will check back as schedule allows to continue with PT POC.  Lenora Boys. PTA Acute Rehabilitation Services Office: [phone]    Catalina Antigua 06/09/2022, 10:27 AM

## 2022-06-09 NOTE — Care Management Important Message (Signed)
Important Message  Patient Details  Name: Leanie Wuethrich MRN: [id number] Date of Birth: 01-12-1956   Medicare Important Message Given:  Yes     Hussam Muniz Stefan Church 06/09/2022, 4:11 PM

## 2022-06-09 NOTE — Discharge Summary (Signed)
Christine Jacobson WUJ:[id number] DOB: [birth year] DOA: 06/02/2022  PCP: Ceasar Lund, PA  Admit date: 06/02/2022  Discharge date: 06/09/2022  Admitted From: Home   Disposition:  Home   Recommendations for Outpatient Follow-up:   Follow up with PCP in 1-2 weeks  PCP Please obtain BMP/CBC, 2 view CXR in 1week,  (see Discharge instructions)   PCP Please follow up on the following pending results: Monitor bilateral lower extremity wounds, electrolytes and fluid status closely.  Needs outpatient OB follow-up within 1 to 2 weeks to be arranged by PCP for incidental adnexal masses noted on renal ultrasound.   Home Health: PT, RN if qualifies Equipment/Devices: as below  Consultations: None  Discharge Condition: Stable    CODE STATUS: Full    Diet Recommendation: Heart Healthy low carbohydrate, strict 1.5 L fluid restriction per day.  CC - Leg wounds    Brief history of present illness from the day of admission and additional interim summary    67/F with chronic diastolic CHF, HOCM, paroxysmal A-fib on Eliquis, chronic lower extremity wounds followed at the wound center previously, hypertension, dyslipidemia, obesity was sent to the ED from PCPs office due to concerns regarding worsening state of her wounds, previously managed at the wound care center for the last several months, now self treating with Neosporin.  Also intermittent shortness of breath X 1 week, In the ED also noted to be edematous, low blood pressures.  Further exam suggested bilateral lower extremity acute on chronic cellulitis along with fluid overload.                                                                 Hospital Course   Sepsis, POA - Chronic wounds with secondary infection, cellulitis  -Sepsis present on admission has completely resolved,  blood cultures negative, on exam cellulitis has almost resolved, Keflex 10 more days, she basically requires outpatient wound care, leg elevation and close monitoring of her fluid status on oral diuretics.  She will be discharged home with outpatient follow-up with PCP on 10 more days of Keflex, oral Lasix with instructions to keep her legs elevated.  Outpatient wound care will be set up as well at [facility] long wound clinic if she qualifies, home PT and RN arranged.  PCP to monitor lower extremity wounds, status and diuretic dose closely.  She has been instructed to practice fluid restriction as well.   AKI -Creatinine 2.1 on admission, from baseline of 0.8, much improved.  PCP to monitor, renal ultrasound negative for hydronephrosis   Adnexal cystic masses -Incidentally noted on renal ultrasound, will need GYN follow-up and further workup for this, request PCP to arrange for it.   Acute on chronic diastolic CHF/S HOCM Placed on oral diuretics and fluid restriction.  PCP to monitor.   Paroxysmal atrial  fibrillation -Currently in sinus rhythm, continue Toprol, continue home dose Eliquis   Hypokalemia -Replaced   Elevated alk phos -Noted since 2023, recommend PCP trend this and consider GI follow-up, no abdominal symptoms at this time   Obesity -BMI is 34.2, needs lifestyle modification per PCP.    Discharge diagnosis     Principal Problem:   Sepsis due to cellulitis [facility]) Active Problems:   Cellulitis   Mild hyperlipidemia   Murmur   Hypertension   HOCM (hypertrophic obstructive cardiomyopathy) (HCC)   Chronic diastolic heart failure (HCC)   Persistent atrial fibrillation (HCC)   ARF (acute renal failure) (HCC)   Hyponatremia   Dehydration   Metabolic acidosis    Discharge instructions    Discharge Instructions     Discharge instructions   Complete by: As directed    Follow with Primary MD Turmel, Caleb P, PA in 7 days   Get CBC, CMP, 2 view Chest X ray -  checked  next visit with your primary MD    Activity: As tolerated with Full fall precautions use walker/cane & assistance as needed keep both lower extremities elevated at all times.  Disposition Home    Diet: Heart Healthy, low carbohydrate- Check your Weight same time everyday, if you gain over 2 pounds, or you develop in leg swelling, experience more shortness of breath or chest pain, call your Primary MD immediately. Follow Cardiac Low Salt Diet and 1.5 lit/day fluid restriction.  Special Instructions: If you have smoked or chewed Tobacco  in the last 2 yrs please stop smoking, stop any regular Alcohol  and or any Recreational drug use.  On your next visit with your primary care physician please Get Medicines reviewed and adjusted.  Please request your Prim.MD to go over all Hospital Tests and Procedure/Radiological results at the follow up, please get all Hospital records sent to your Prim MD by signing hospital release before you go home.  If you experience worsening of your admission symptoms, develop shortness of breath, life threatening emergency, suicidal or homicidal thoughts you must seek medical attention immediately by calling 911 or calling your MD immediately  if symptoms less severe.  You Must read complete instructions/literature along with all the possible adverse reactions/side effects for all the Medicines you take and that have been prescribed to you. Take any new Medicines after you have completely understood and accpet all the possible adverse reactions/side effects.   Discharge wound care:   Complete by: As directed    Cleanse legs with soap and water and pat dry. Apply Aquacel to open wounds(LAWSON # [id number])  WIll need 4 sheets each dressing change.  Cover with gauze and kerlix.  Ace wrap from below toes to below knee for modified light compression.  CHange every other day and PRN soilage (strikethrough drainage) .   Increase activity slowly   Complete by: As directed         Discharge Medications   Allergies as of 06/09/2022       Reactions   Beef-derived Products    Does not eat pork d/t religious reasons, but okay with receiving heparin.  Discussed with patient 06/05/22   Chicken Protein    Fish-derived Products    Pork-derived Products         Medication List     STOP taking these medications    diltiazem 180 MG 24 hr capsule Commonly known as: CARDIZEM CD   ibuprofen 200 MG tablet Commonly known as: ADVIL   torsemide  20 MG tablet Commonly known as: DEMADEX       TAKE these medications    acetaminophen 325 MG tablet Commonly known as: TYLENOL Take 2 tablets (650 mg total) by mouth every 6 (six) hours as needed for mild pain or moderate pain.   apixaban 5 MG Tabs tablet Commonly known as: Eliquis Take 1 tablet (5 mg total) by mouth 2 (two) times daily.   ascorbic acid 500 MG tablet Commonly known as: VITAMIN C Take 1 tablet (500 mg total) by mouth daily. Start taking on: Jun 10, 2022   cephALEXin 250 MG capsule Commonly known as: KEFLEX Take 1 capsule (250 mg total) by mouth 3 (three) times daily.   furosemide 40 MG tablet Commonly known as: Lasix Take 1 tablet (40 mg total) by mouth 2 (two) times daily.   HYDROcodone-acetaminophen 5-325 MG tablet Commonly known as: NORCO/VICODIN Take 1 tablet by mouth every 12 (twelve) hours as needed for severe pain.   Jardiance 10 MG Tabs tablet Generic drug: empagliflozin Take 10 mg by mouth daily.   metoprolol succinate 100 MG 24 hr tablet Commonly known as: TOPROL-XL TAKE ONE TABLET BY MOUTH TWICE A DAY WITH OR IMMEDIATELY FOLLOWING A MEAL What changed:  how much to take how to take this when to take this additional instructions   midodrine 5 MG tablet Commonly known as: PROAMATINE Take 1 tablet (5 mg total) by mouth 2 (two) times daily with a meal.   polyethylene glycol 17 g packet Commonly known as: MIRALAX / GLYCOLAX Take 17 g by mouth daily as needed for mild  constipation.   potassium chloride SA 20 MEQ tablet Commonly known as: KLOR-CON M Take 1 tablet (20 mEq total) by mouth daily.   sertraline 50 MG tablet Commonly known as: Zoloft Take 1 tablet (50 mg total) by mouth at bedtime.   zinc sulfate 220 (50 Zn) MG capsule Take 1 capsule (220 mg total) by mouth daily. Start taking on: Jun 10, 2022               Discharge Care Instructions  (From admission, onward)           Start     Ordered   06/09/22 0000  Discharge wound care:       Comments: Cleanse legs with soap and water and pat dry. Apply Aquacel to open wounds(LAWSON # [id number])  WIll need 4 sheets each dressing change.  Cover with gauze and kerlix.  Ace wrap from below toes to below knee for modified light compression.  CHange every other day and PRN soilage (strikethrough drainage) .   06/09/22 0937             Follow-up Information     [facility], [facility] Follow up.   Specialty: Home Health Services Why: Home health has been arranged. They will contact you to schedule apt within 48hrs post discharge. Contact information: [address] [place] [address] [phone]         Turmel, Wilber Oliphant P, PA. Schedule an appointment as soon as possible for a visit in 1 week(s).   Specialty: Physician Assistant Why: Also follow-up with your wound care clinic within the next 2 to 3 days Contact information: [address] [place] [address] [place] [address] [phone]                 Major procedures and Radiology Reports - PLEASE review detailed and final reports thoroughly  -  US RENAL  Result Date: 06/02/2022 CLINICAL DATA:  [id number] ARF (acute renal failure) (HCC) [id number] EXAM: RENAL / URINARY TRACT ULTRASOUND COMPLETE COMPARISON:  None Available. FINDINGS: Right Kidney: Renal measurements: 10.8 x 5.1 x 4.7 cm = volume: 134 mL. Echogenicity within normal limits. No mass or hydronephrosis visualized. Left Kidney: Renal  measurements: 11.1 x 5.8 x 4.8 cm = volume: 147 mL. Echogenicity within normal limits. No mass or hydronephrosis visualized. Bladder: Appears normal for degree of bladder distention. Other: There are incompletely assessed possible cystic masses of the pelvis. One measures approximately 8 cm while the second measures approximately 6.7 cm. IMPRESSION: 1. No hydronephrosis. 2. There are incompletely assessed possible cystic masses of the pelvis, likely within the bilateral adnexa. Recommend dedicated CT abdomen pelvis for further evaluation. Electronically Signed   By: Meda Klinefelter M.D.   On: 06/02/2022 18:46   DG CHEST PORT 1 VIEW  Result Date: 06/02/2022 CLINICAL DATA:  [id number] Sepsis [facility]) [id number] EXAM: PORTABLE CHEST - 1 VIEW COMPARISON:  11/23/2021 FINDINGS: Lungs are clear. Heart size upper limits normal as before. No effusion. Visualized bones unremarkable. IMPRESSION: No acute cardiopulmonary disease. Electronically Signed   By: Corlis Leak M.D.   On: 06/02/2022 18:21   DG Tibia/Fibula Right  Result Date: 06/02/2022 CLINICAL DATA:  Lower extremity wound. Evaluate for disc space infection. EXAM: RIGHT TIBIA AND FIBULA - 2 VIEW COMPARISON:  None Available. FINDINGS: There is diffuse right calf subcutaneous fat edema and swelling. Mild navicular-cuneiform joint space narrowing. Tiny plantar calcaneal heel spur. Mild superior patellar degenerative osteophytosis. Tiny joint effusion of the knee. Moderate lateral compartment of the knee joint space narrowing. The tibial and fibular cortices are intact. No acute fracture or dislocation. IMPRESSION: 1. Diffuse right calf subcutaneous fat edema and swelling. No radiographic evidence of osteomyelitis. 2. Mild-to-moderate lateral compartment of the knee osteoarthritis. Electronically Signed   By: Neita Garnet M.D.   On: 06/02/2022 12:52   DG Tibia/Fibula Left  Result Date: 06/02/2022 CLINICAL DATA:  Lower extremity wound. Evaluate for deep space  infection. Cellulitis. EXAM: LEFT TIBIA AND FIBULA - 2 VIEW COMPARISON:  Left tibia and fibula radiographs 10/14/2021; CT left tibia and fibula 10/14/2021 FINDINGS: Moderate medial knee compartment and severe patellofemoral compartment joint space narrowing, subchondral sclerosis/cystic change, and peripheral osteophytosis. No joint effusion within the knee. Tiny plantar calcaneal heel spur. Moderate midfoot osteoarthritis. Moderate dorsal midfoot soft tissue swelling. Mild lucency overlying the dorsal navicular and cuneiform bones and lateral view presumably is secondary to overlying subcutaneous fat edema lucencies. The tibia and fibula cortices are intact. No acute fracture dislocation. IMPRESSION: 1. Moderate dorsal midfoot soft tissue swelling. Mild lucency overlying the dorsal navicular and cuneiform bones and lateral view presumably is secondary to overlying subcutaneous fat edema lucencies. 2. No CT evidence of osteomyelitis of the left tibia or fibula. 3. Moderate medial knee compartment and severe patellofemoral compartment osteoarthritis. Electronically Signed   By: Neita Garnet M.D.   On: 06/02/2022 12:50    Micro Results     Recent Results (from the past 240 hour(s))  Culture, blood (routine x 2)     Status: None   Collection Time: 06/02/22 11:42 AM   Specimen: BLOOD  Result Value Ref Range Status   Specimen Description BLOOD LEFT ANTECUBITAL  Final   Special Requests   Final    BOTTLES DRAWN AEROBIC ONLY Blood Culture adequate volume   Culture   Final    NO GROWTH 5 DAYS Performed at [facility]  Hospital Lab, [address], [place], [place] [address]    Report Status 06/07/2022 FINAL  Final  MRSA Next Gen by PCR, Nasal     Status: None   Collection Time: 06/02/22  5:51 PM   Specimen: Nasal Mucosa; Nasal Swab  Result Value Ref Range Status   MRSA by PCR Next Gen NOT DETECTED NOT DETECTED Final    Comment: (NOTE) The GeneXpert MRSA Assay (FDA approved for NASAL specimens only), is  one component of a comprehensive MRSA colonization surveillance program. It is not intended to diagnose MRSA infection nor to guide or monitor treatment for MRSA infections. Test performance is not FDA approved in patients less than 75 years old. Performed at [facility], [address], [place], [place] [address]   Urine Culture (for pregnant, neutropenic or urologic patients or patients with an indwelling urinary catheter)     Status: Abnormal   Collection Time: 06/03/22  4:00 AM   Specimen: Urine, Clean Catch  Result Value Ref Range Status   Specimen Description URINE, CLEAN CATCH  Final   Special Requests   Final    NONE Performed at [facility], [address] [place], [place] [address]    Culture MULTIPLE SPECIES PRESENT, SUGGEST RECOLLECTION (A)  Final   Report Status 06/04/2022 FINAL  Final  MRSA Next Gen by PCR, Nasal     Status: None   Collection Time: 06/09/22  5:27 AM   Specimen: Nasal Mucosa; Nasal Swab  Result Value Ref Range Status   MRSA by PCR Next Gen NOT DETECTED NOT DETECTED Final    Comment: (NOTE) The GeneXpert MRSA Assay (FDA approved for NASAL specimens only), is one component of a comprehensive MRSA colonization surveillance program. It is not intended to diagnose MRSA infection nor to guide or monitor treatment for MRSA infections. Test performance is not FDA approved in patients less than 65 years old. Performed at [facility], [address] [place], [place] [address]     Today   Subjective    Letonia Dosh today has no headache,no chest abdominal pain,no new weakness tingling or numbness, feels much better wants to go home today.  She refused to talk to me earlier this morning and wanted me to leave the room " I only want to talk to the doctor, stop bothering me " , once she realized that I was the MD she apologized and cooperated with the interview.   Objective   Blood pressure 110/63, pulse 90, temperature 98.4 F (36.9  C), temperature source Oral, resp. rate 13, height 5\' 4"  (1.626 m), weight 90 kg, SpO2 99 %.   Intake/Output Summary (Last 24 hours) at 06/09/2022 0940 Last data filed at 06/09/2022 0900 Gross per 24 hour  Intake 689.02 ml  Output --  Net 689.02 ml    Exam  Awake Alert, No new F.N deficits,    [place].AT,PERRAL Supple Neck,   Symmetrical Chest wall movement, Good air movement bilaterally, CTAB RRR,No Gallops,   +ve B.Sounds, Abd Soft, Non tender,  Bilateral lower extremity edema, chronic wounds under bandage, no surrounding erythema     Data Review   Recent Labs  Lab 06/02/22 1123 06/03/22 0028 06/04/22 0025 06/05/22 0017 06/06/22 0757 06/07/22 0654 06/08/22 0024 06/09/22 0358  WBC 12.3* 12.9*   < > 9.6 9.8 9.6 8.4 9.0  HGB 12.7 10.9*   < > 10.9* 11.0* 9.7* 9.5* 9.0*  HCT 38.7 32.7*   < > 32.5* 33.4*  29.2* 29.4* 27.7*  PLT 305 227   < > 261 254 228 228 200  MCV 95.3 92.4   < > 93.1 94.6 94.5 95.1 93.9  MCH 31.3 30.8   < > 31.2 31.2 31.4 30.7 30.5  MCHC 32.8 33.3   < > 33.5 32.9 33.2 32.3 32.5  RDW 15.3 15.1   < > 15.1 15.5 15.3 15.3 15.5  LYMPHSABS 0.7 0.7  --   --   --   --   --   --   MONOABS 0.8 1.2*  --   --   --   --   --   --   EOSABS 0.1 0.1  --   --   --   --   --   --   BASOSABS 0.1 0.1  --   --   --   --   --   --    < > = values in this interval not displayed.    Recent Labs  Lab 06/02/22 1142 06/02/22 1903 06/03/22 0028 06/04/22 0025 06/05/22 0017 06/06/22 0757 06/07/22 0654 06/08/22 0024 06/09/22 0358 06/09/22 0541  NA  --   --  128*   < > 131* 134* 135 133* 136  --   K  --   --  4.3   < > 3.4* 4.1 3.4* 3.1* 4.5  --   CL  --   --  102   < > 100 103 102 102 103  --   CO2  --   --  15*   < > 21* 21* 23 24 24   --   ANIONGAP  --   --  11   < > 10 10 10 7 9   --   GLUCOSE  --   --  123*   < > 115* 167* 106* 110* 92  --   BUN  --   --  50*   < > 35* 30* 30* 30* 26*  --   CREATININE  --   --  2.04*   < > 1.41* 1.29* 1.25* 1.40* 1.24*  --   AST  --    --  29  --   --   --   --  45*  --   --   ALT  --   --  44  --   --   --   --  39  --   --   ALKPHOS  --   --  305*  --   --   --   --  290*  --   --   BILITOT  --   --  0.4  --   --   --   --  0.8  --   --   ALBUMIN  --   --  2.3*  --   --   --   --  2.9*  --   --   CRP  --  2.8*  --   --   --   --   --   --   --  1.1*  PROCALCITON  --   --   --   --   --   --   --   --   --  0.11  LATICACIDVEN 2.2*  --  1.4  --   --   --   --   --   --   --   HGBA1C  --   --  5.0  --   --   --   --   --   --   --  BNP  --   --   --   --   --   --   --   --   --  1,459.6*  MG  --   --   --   --   --   --   --   --   --  1.8  CALCIUM  --   --  8.3*   < > 9.0 9.3 9.8 9.5 9.8  --    < > = values in this interval not displayed.    Total Time in preparing paper work, data evaluation and todays exam - 35 minutes  Signature  -    Susa Raring M.D on 06/09/2022 at 9:40 AM   -  To page go to [email or web address]

## 2022-06-09 NOTE — Discharge Instructions (Addendum)
Follow with Primary MD Turmel, Caleb P, PA in 7 days   Get CBC, CMP, 2 view Chest X ray -  checked next visit with your primary MD    Activity: As tolerated with Full fall precautions use walker/cane & assistance as needed keep both lower extremities elevated at all times.  Disposition Home    Diet: Heart Healthy, low carbohydrate- Check your Weight same time everyday, if you gain over 2 pounds, or you develop in leg swelling, experience more shortness of breath or chest pain, call your Primary MD immediately. Follow Cardiac Low Salt Diet and 1.5 lit/day fluid restriction.  Special Instructions: If you have smoked or chewed Tobacco  in the last 2 yrs please stop smoking, stop any regular Alcohol  and or any Recreational drug use.  On your next visit with your primary care physician please Get Medicines reviewed and adjusted.  Please request your Prim.MD to go over all Hospital Tests and Procedure/Radiological results at the follow up, please get all Hospital records sent to your Prim MD by signing hospital release before you go home.  If you experience worsening of your admission symptoms, develop shortness of breath, life threatening emergency, suicidal or homicidal thoughts you must seek medical attention immediately by calling 911 or calling your MD immediately  if symptoms less severe.  You Must read complete instructions/literature along with all the possible adverse reactions/side effects for all the Medicines you take and that have been prescribed to you. Take any new Medicines after you have completely understood and accpet all the possible adverse reactions/side effects.

## 2022-06-17 ENCOUNTER — Ambulatory Visit: Payer: Medicare Other | Attending: Cardiology | Admitting: Cardiology

## 2022-06-17 ENCOUNTER — Encounter: Payer: Self-pay | Admitting: Cardiology

## 2022-06-17 VITALS — BP 116/64 | HR 70 | Ht 64.0 in | Wt 203.4 lb

## 2022-06-17 DIAGNOSIS — I5032 Chronic diastolic (congestive) heart failure: Secondary | ICD-10-CM | POA: Diagnosis not present

## 2022-06-17 DIAGNOSIS — I421 Obstructive hypertrophic cardiomyopathy: Secondary | ICD-10-CM | POA: Diagnosis not present

## 2022-06-17 DIAGNOSIS — I10 Essential (primary) hypertension: Secondary | ICD-10-CM

## 2022-06-17 DIAGNOSIS — I4819 Other persistent atrial fibrillation: Secondary | ICD-10-CM

## 2022-06-17 DIAGNOSIS — Z79899 Other long term (current) drug therapy: Secondary | ICD-10-CM

## 2022-06-17 MED ORDER — TORSEMIDE 20 MG PO TABS: 20.0000 mg | ORAL_TABLET | Freq: Two times a day (BID) | ORAL | 3 refills | Status: DC

## 2022-06-17 NOTE — Progress Notes (Unsigned)
Cardiology Office Note:    Date:  06/18/2022   ID:  Christine Jacobson, DOB 1956/01/10, MRN [id number]  PCP:  Ceasar Lund, PA  Cardiologist:  Thomasene Ripple, DO  Electrophysiologist:  None   Referring MD: Ceasar Lund, PA   " I am really short of breath"  History of Present Illness:    Christine Jacobson is a 67 y.o. female with a hx of  obesity, hypertrophic cardiomyopathy with intracavity gradient of 64 mmHg which increased with Valsalva, chronic diastolic heart failure grade 2 diastolic dysfunction, paroxysmal atrial fibrillation status post cardioversion, hypertension, hypercholesteremia.  I saw the patient on December 02, 2021 for the first time after she requested to transfer to our practice. the patient previously followed with [facility] cardiology and was last seen by Dr. Yates Decamp January on October 13, 2021 at that time per his note they had multiple times discuss the fact that she is continue to have leg swelling.  He even the visit prior has sent her to the emergency department on 923 where she was discharged home after ED evaluation.  Per his note the patient fell in the office while trying to walk to check out this was called and she was directly taken to the emergency department.   She was seen by me on 02/10/2022, at that time she agreed to be more complaint with her torsemide. She was in sinus rhythm at that visit. I ordered a PFT.   Since her visit she was admitted for sepsis secondary to cellulitis. She was diuresed during that visit. Maintained sinus rhythm during her admission.   She is here today with her friend [name].    Past Medical History:  Diagnosis Date   Chest pain 11/22/2010   2D STRESS ECHO - EF 60%, peak stress EF 80%, normal, no evidence for stress-induced ischemia   HOCM (hypertrophic obstructive cardiomyopathy) (HCC) 06/21/2011   2D ECHO - EF >55%, normal   HTN (hypertension)    Hyperprolactinemia (HCC)    dx in her 18, took meds, self d/c a while back    Liver function test abnormality    normal when repeated   Mild hyperlipidemia    Obesity    Palpitations    negative stress echo in November 2012 with normal LV function; mild LVH, proximal septal thickening with narrow LVOT and mild gradient; mild MR and TR; Cardionet showed PACs in November 2012   Pyoderma gangrenosa    Shortness of breath 07/11/2011   MET TEST    Past Surgical History:  Procedure Laterality Date   CARDIOVERSION N/A 02/01/2022   Procedure: CARDIOVERSION;  Surgeon: Thomasene Ripple, DO;  Location: [facility] ENDOSCOPY;  Service: Cardiovascular;  Laterality: N/A;   RIGHT HEART CATH N/A 10/15/2021   Procedure: RIGHT HEART CATH;  Surgeon: Elder Negus, MD;  Location: [facility] INVASIVE CV LAB;  Service: Cardiovascular;  Laterality: N/A;   SKIN GRAFT  2006   porcine, R leg    Current Medications: Current Meds  Medication Sig   acetaminophen (TYLENOL) 325 MG tablet Take 2 tablets (650 mg total) by mouth every 6 (six) hours as needed for mild pain or moderate pain.   apixaban (ELIQUIS) 5 MG TABS tablet Take 1 tablet (5 mg total) by mouth 2 (two) times daily.   ascorbic acid (VITAMIN C) 500 MG tablet Take 1 tablet (500 mg total) by mouth daily for 30 days   cephALEXin (KEFLEX) 250 MG capsule Take 1 capsule (250 mg total) by mouth 3 (three)  times daily.   HYDROcodone-acetaminophen (NORCO/VICODIN) 5-325 MG tablet Take 1 tablet by mouth every 12 (twelve) hours as needed for severe pain.   JARDIANCE 10 MG TABS tablet Take 10 mg by mouth daily.   metoprolol succinate (TOPROL-XL) 100 MG 24 hr tablet TAKE ONE TABLET BY MOUTH TWICE A DAY WITH OR IMMEDIATELY FOLLOWING A MEAL (Patient taking differently: Take 100 mg by mouth 2 (two) times daily.)   midodrine (PROAMATINE) 5 MG tablet Take 1 tablet (5 mg total) by mouth 2 (two) times daily with a meal.   polyethylene glycol powder (GLYCOLAX/MIRALAX) 17 GM/SCOOP powder Take 1 capful (17 g) by mouth daily as needed for mild constipation.   potassium  chloride SA (KLOR-CON M) 20 MEQ tablet Take 1 tablet (20 mEq total) by mouth daily.   sertraline (ZOLOFT) 50 MG tablet Take 1 tablet (50 mg total) by mouth at bedtime.   torsemide (DEMADEX) 20 MG tablet Take 1 tablet (20 mg total) by mouth 2 (two) times daily.   Zinc Sulfate 220 (50 Zn) MG TABS Take 1 tablet (220 mg total) by mouth daily for 30 days   [DISCONTINUED] furosemide (LASIX) 40 MG tablet Take 1 tablet (40 mg total) by mouth 2 (two) times daily.     Allergies:   Beef-derived products, Chicken protein, Fish-derived products, and Pork-derived products   Social History   Socioeconomic History   Marital status: Single    Spouse name: Not on file   Number of children: 0   Years of education: Not on file   Highest education level: Not on file  Occupational History   Occupation: para Psychologist, forensic, part time cook  Tobacco Use   Smoking status: Former    Packs/day: 1.00    Years: 4.00    Additional pack years: 0.00    Total pack years: 4.00    Types: Cigarettes   Smokeless tobacco: Never  Vaping Use   Vaping Use: Never used  Substance and Sexual Activity   Alcohol use: Not Currently    Alcohol/week: 1.0 standard drink of alcohol    Types: 1 Glasses of wine per week    Comment: "I do have my wine" every day, has cut down from 2 bottles at a time, currently 2-3 glasses   Drug use: No   Sexual activity: Not Currently  Other Topics Concern   Not on file  Social History Narrative   Lives by self   Social Determinants of Health   Financial Resource Strain: Low Risk  (12/03/2021)   Overall Financial Resource Strain (CARDIA)    Difficulty of Paying Living Expenses: Not very hard  Food Insecurity: Food Insecurity Present (06/02/2022)   Hunger Vital Sign    Worried About Running Out of Food in the Last Year: Sometimes true    Ran Out of Food in the Last Year: Sometimes true  Transportation Needs: No Transportation Needs (06/02/2022)   PRAPARE - Scientist, research (physical sciences) (Medical): No    Lack of Transportation (Non-Medical): No  Physical Activity: Not on file  Stress: Not on file  Social Connections: Not on file     Family History: The patient's family history includes Coronary artery disease in an other family member; Diabetes in an other family member; Emphysema in her father; Heart disease in her mother; Ovarian cancer in her mother. There is no history of Cancer.  ROS:   Review of Systems  Constitution: Negative for decreased appetite, fever and weight gain.  HENT:  Negative for congestion, ear discharge, hoarse voice and sore throat.   Eyes: Negative for discharge, redness, vision loss in right eye and visual halos.  Cardiovascular: Negative for chest pain, dyspnea on exertion, leg swelling, orthopnea and palpitations.  Respiratory: Negative for cough, hemoptysis, shortness of breath and snoring.   Endocrine: Negative for heat intolerance and polyphagia.  Hematologic/Lymphatic: Negative for bleeding problem. Does not bruise/bleed easily.  Skin: Negative for flushing, nail changes, rash and suspicious lesions.  Musculoskeletal: Negative for arthritis, joint pain, muscle cramps, myalgias, neck pain and stiffness.  Gastrointestinal: Negative for abdominal pain, bowel incontinence, diarrhea and excessive appetite.  Genitourinary: Negative for decreased libido, genital sores and incomplete emptying.  Neurological: Negative for brief paralysis, focal weakness, headaches and loss of balance.  Psychiatric/Behavioral: Negative for altered mental status, depression and suicidal ideas.  Allergic/Immunologic: Negative for HIV exposure and persistent infections.    EKGs/Labs/Other Studies Reviewed:    The following studies were reviewed today:   EKG:  None today  Recent Labs: 06/09/2022: B Natriuretic Peptide 1,459.6; Hemoglobin 9.0; Platelets 200 06/17/2022: ALT 63; BUN 22; Creatinine, Ser 1.14; Magnesium 2.0; Potassium 4.8; Sodium 138   Recent Lipid Panel    Component Value Date/Time   CHOL 228 (H) 06/03/2022 0028   TRIG 172 (H) 06/03/2022 0028   HDL 48 06/03/2022 0028   CHOLHDL 4.8 06/03/2022 0028   VLDL 34 06/03/2022 0028   LDLCALC 146 (H) 06/03/2022 0028   LDLDIRECT 121.8 05/26/2011 0928    Physical Exam:    VS:  BP 116/64   Pulse 70   Ht 5\' 4"  (1.626 m)   Wt 203 lb 6.4 oz (92.3 kg)   SpO2 98%   BMI 34.91 kg/m     Wt Readings from Last 3 Encounters:  06/17/22 203 lb 6.4 oz (92.3 kg)  06/09/22 198 lb 8 oz (90 kg)  02/10/22 219 lb 6.4 oz (99.5 kg)     GEN: Well nourished, well developed in no acute distress HEENT: Normal NECK: No JVD; No carotid bruits LYMPHATICS: No lymphadenopathy CARDIAC: S1S2 noted,RRR, no murmurs, rubs, gallops RESPIRATORY:  Clear to auscultation without rales, wheezing or rhonchi  ABDOMEN: Soft, non-tender, non-distended, +bowel sounds, no guarding. EXTREMITIES: No edema, No cyanosis, no clubbing MUSCULOSKELETAL:  No deformity  SKIN: Warm and dry NEUROLOGIC:  Alert and oriented x 3, non-focal PSYCHIATRIC:  Normal affect, good insight  ASSESSMENT:    1. Medication management   2. Primary hypertension   3. HOCM (hypertrophic obstructive cardiomyopathy) (HCC)   4. Chronic diastolic heart failure (HCC)   5. Persistent atrial fibrillation (HCC)     PLAN:     We discussed taking her diuretics as prescribed, she had agreed to take this medication.  Thankfully she remains in sinus rhythm we will continue her current regimen.  Her blood pressure is acceptable, once we are able to keep her on her torsemide I think this will improve as well.  The patient understands the need to lose weight with diet and exercise. We have discussed specific strategies for this.  The patient is in agreement with the above plan. The patient left the office in stable condition.  The patient will follow up in   Medication Adjustments/Labs and Tests Ordered: Current medicines are reviewed at  length with the patient today.  Concerns regarding medicines are outlined above.  Orders Placed This Encounter  Procedures   Comprehensive Metabolic Panel (CMET)   Magnesium   Meds ordered this encounter  Medications   torsemide (  DEMADEX) 20 MG tablet    Sig: Take 1 tablet (20 mg total) by mouth 2 (two) times daily.    Dispense:  180 tablet    Refill:  3    Patient Instructions  Medication Instructions:  Your physician has recommended you make the following change in your medication:  STOP: Lasix START: Torsemide 20 mg twice daily *If you need a refill on your cardiac medications before your next appointment, please call your pharmacy*   Lab Work: Your physician recommends that you have labs drawn: CMET, Mag If you have labs (blood work) drawn today and your tests are completely normal, you will receive your results only by: MyChart Message (if you have MyChart) OR A paper copy in the mail If you have any lab test that is abnormal or we need to change your treatment, we will call you to review the results.   Testing/Procedures: None   Follow-Up: At [facility], you and your health needs are our priority.  As part of our continuing mission to provide you with exceptional heart care, we have created designated Provider Care Teams.  These Care Teams include your primary Cardiologist (physician) and Advanced Practice Providers (APPs -  Physician Assistants and Nurse Practitioners) who all work together to provide you with the care you need, when you need it.  Your next appointment:   3 month(s)  Provider:   Thomasene Ripple, DO     Adopting a Healthy Lifestyle.  Know what a healthy weight is for you (roughly BMI <25) and aim to maintain this   Aim for 7+ servings of fruits and vegetables daily   65-80+ fluid ounces of water or unsweet tea for healthy kidneys   Limit to max 1 drink of alcohol per day; avoid smoking/tobacco   Limit animal fats in diet for  cholesterol and heart health - choose grass fed whenever available   Avoid highly processed foods, and foods high in saturated/trans fats   Aim for low stress - take time to unwind and care for your mental health   Aim for 150 min of moderate intensity exercise weekly for heart health, and weights twice weekly for bone health   Aim for 7-9 hours of sleep daily   When it comes to diets, agreement about the perfect plan isnt easy to find, even among the experts. Experts at the [facility] of [employer] developed an idea known as the Healthy Eating Plate. Just imagine a plate divided into logical, healthy portions.   The emphasis is on diet quality:   Load up on vegetables and fruits - one-half of your plate: Aim for color and variety, and remember that potatoes dont count.   Go for whole grains - one-quarter of your plate: Whole wheat, barley, wheat berries, quinoa, oats, brown rice, and foods made with them. If you want pasta, go with whole wheat pasta.   Protein power - one-quarter of your plate: Fish, chicken, beans, and nuts are all healthy, versatile protein sources. Limit red meat.   The diet, however, does go beyond the plate, offering a few other suggestions.   Use healthy plant oils, such as olive, canola, soy, corn, sunflower and peanut. Check the labels, and avoid partially hydrogenated oil, which have unhealthy trans fats.   If youre thirsty, drink water. Coffee and tea are good in moderation, but skip sugary drinks and limit milk and dairy products to one or two daily servings.   The type of carbohydrate in the  diet is more important than the amount. Some sources of carbohydrates, such as vegetables, fruits, whole grains, and beans-are healthier than others.   Finally, stay active  Signed, Thomasene Ripple, DO  06/18/2022 9:57 PM     [facility]

## 2022-06-17 NOTE — Patient Instructions (Signed)
Medication Instructions:  Your physician has recommended you make the following change in your medication:  STOP: Lasix START: Torsemide 20 mg twice daily *If you need a refill on your cardiac medications before your next appointment, please call your pharmacy*   Lab Work: Your physician recommends that you have labs drawn: CMET, Mag If you have labs (blood work) drawn today and your tests are completely normal, you will receive your results only by: MyChart Message (if you have MyChart) OR A paper copy in the mail If you have any lab test that is abnormal or we need to change your treatment, we will call you to review the results.   Testing/Procedures: None   Follow-Up: At [facility], you and your health needs are our priority.  As part of our continuing mission to provide you with exceptional heart care, we have created designated Provider Care Teams.  These Care Teams include your primary Cardiologist (physician) and Advanced Practice Providers (APPs -  Physician Assistants and Nurse Practitioners) who all work together to provide you with the care you need, when you need it.  Your next appointment:   3 month(s)  Provider:   Thomasene Ripple, DO

## 2022-06-18 LAB — COMPREHENSIVE METABOLIC PANEL
ALT: 63 IU/L — ABNORMAL HIGH (ref 0–32)
AST: 68 IU/L — ABNORMAL HIGH (ref 0–40)
Albumin/Globulin Ratio: 1.7 (ref 1.2–2.2)
Albumin: 4 g/dL (ref 3.9–4.9)
Alkaline Phosphatase: 571 IU/L — ABNORMAL HIGH (ref 44–121)
BUN/Creatinine Ratio: 19 (ref 12–28)
BUN: 22 mg/dL (ref 8–27)
Bilirubin Total: 0.6 mg/dL (ref 0.0–1.2)
CO2: 21 mmol/L (ref 20–29)
Calcium: 9.8 mg/dL (ref 8.7–10.3)
Chloride: 103 mmol/L (ref 96–106)
Creatinine, Ser: 1.14 mg/dL — ABNORMAL HIGH (ref 0.57–1.00)
Globulin, Total: 2.3 g/dL (ref 1.5–4.5)
Glucose: 100 mg/dL — ABNORMAL HIGH (ref 70–99)
Potassium: 4.8 mmol/L (ref 3.5–5.2)
Sodium: 138 mmol/L (ref 134–144)
Total Protein: 6.3 g/dL (ref 6.0–8.5)
eGFR: 52 mL/min/{1.73_m2} — ABNORMAL LOW (ref >59)

## 2022-06-18 LAB — MAGNESIUM: Magnesium: 2 mg/dL (ref 1.6–2.3)

## 2022-07-13 ENCOUNTER — Encounter (HOSPITAL_COMMUNITY): Payer: Self-pay | Admitting: Internal Medicine

## 2022-07-13 ENCOUNTER — Inpatient Hospital Stay (HOSPITAL_COMMUNITY)
Admission: EM | Admit: 2022-07-13 | Discharge: 2022-07-21 | DRG: 602 | Disposition: A | Discharge status: Home Health | Payer: Medicare HMO | Attending: Family Medicine | Admitting: Family Medicine

## 2022-07-13 ENCOUNTER — Other Ambulatory Visit: Payer: Self-pay

## 2022-07-13 DIAGNOSIS — L03115 Cellulitis of right lower limb: Secondary | ICD-10-CM | POA: Diagnosis not present

## 2022-07-13 DIAGNOSIS — D120 Benign neoplasm of cecum: Secondary | ICD-10-CM | POA: Diagnosis not present

## 2022-07-13 DIAGNOSIS — I130 Hypertensive heart and chronic kidney disease with heart failure and stage 1 through stage 4 chronic kidney disease, or unspecified chronic kidney disease: Secondary | ICD-10-CM | POA: Diagnosis present

## 2022-07-13 DIAGNOSIS — D62 Acute posthemorrhagic anemia: Secondary | ICD-10-CM | POA: Diagnosis not present

## 2022-07-13 DIAGNOSIS — E669 Obesity, unspecified: Secondary | ICD-10-CM | POA: Diagnosis present

## 2022-07-13 DIAGNOSIS — E782 Mixed hyperlipidemia: Secondary | ICD-10-CM | POA: Diagnosis present

## 2022-07-13 DIAGNOSIS — Z87891 Personal history of nicotine dependence: Secondary | ICD-10-CM | POA: Diagnosis not present

## 2022-07-13 DIAGNOSIS — E785 Hyperlipidemia, unspecified: Secondary | ICD-10-CM | POA: Diagnosis present

## 2022-07-13 DIAGNOSIS — F419 Anxiety disorder, unspecified: Secondary | ICD-10-CM | POA: Diagnosis present

## 2022-07-13 DIAGNOSIS — L03116 Cellulitis of left lower limb: Secondary | ICD-10-CM

## 2022-07-13 DIAGNOSIS — Z7901 Long term (current) use of anticoagulants: Secondary | ICD-10-CM

## 2022-07-13 DIAGNOSIS — N1831 Chronic kidney disease, stage 3a: Secondary | ICD-10-CM | POA: Diagnosis present

## 2022-07-13 DIAGNOSIS — I10 Essential (primary) hypertension: Secondary | ICD-10-CM | POA: Diagnosis present

## 2022-07-13 DIAGNOSIS — D649 Anemia, unspecified: Secondary | ICD-10-CM | POA: Diagnosis present

## 2022-07-13 DIAGNOSIS — E872 Acidosis, unspecified: Secondary | ICD-10-CM | POA: Diagnosis present

## 2022-07-13 DIAGNOSIS — F32A Depression, unspecified: Secondary | ICD-10-CM | POA: Diagnosis present

## 2022-07-13 DIAGNOSIS — I272 Pulmonary hypertension, unspecified: Secondary | ICD-10-CM | POA: Diagnosis present

## 2022-07-13 DIAGNOSIS — Z8041 Family history of malignant neoplasm of ovary: Secondary | ICD-10-CM

## 2022-07-13 DIAGNOSIS — R7981 Abnormal blood-gas level: Secondary | ICD-10-CM | POA: Diagnosis present

## 2022-07-13 DIAGNOSIS — I959 Hypotension, unspecified: Secondary | ICD-10-CM | POA: Diagnosis present

## 2022-07-13 DIAGNOSIS — I4819 Other persistent atrial fibrillation: Secondary | ICD-10-CM | POA: Diagnosis present

## 2022-07-13 DIAGNOSIS — E221 Hyperprolactinemia: Secondary | ICD-10-CM | POA: Diagnosis present

## 2022-07-13 DIAGNOSIS — Z91013 Allergy to seafood: Secondary | ICD-10-CM

## 2022-07-13 DIAGNOSIS — Z66 Do not resuscitate: Secondary | ICD-10-CM | POA: Diagnosis present

## 2022-07-13 DIAGNOSIS — E876 Hypokalemia: Secondary | ICD-10-CM | POA: Diagnosis present

## 2022-07-13 DIAGNOSIS — K2970 Gastritis, unspecified, without bleeding: Secondary | ICD-10-CM | POA: Diagnosis not present

## 2022-07-13 DIAGNOSIS — Z833 Family history of diabetes mellitus: Secondary | ICD-10-CM

## 2022-07-13 DIAGNOSIS — E538 Deficiency of other specified B group vitamins: Secondary | ICD-10-CM | POA: Diagnosis present

## 2022-07-13 DIAGNOSIS — I5033 Acute on chronic diastolic (congestive) heart failure: Secondary | ICD-10-CM | POA: Diagnosis present

## 2022-07-13 DIAGNOSIS — Z79899 Other long term (current) drug therapy: Secondary | ICD-10-CM | POA: Diagnosis not present

## 2022-07-13 DIAGNOSIS — Z8249 Family history of ischemic heart disease and other diseases of the circulatory system: Secondary | ICD-10-CM | POA: Diagnosis not present

## 2022-07-13 DIAGNOSIS — Z91018 Allergy to other foods: Secondary | ICD-10-CM

## 2022-07-13 DIAGNOSIS — I5032 Chronic diastolic (congestive) heart failure: Secondary | ICD-10-CM | POA: Diagnosis present

## 2022-07-13 DIAGNOSIS — Z6836 Body mass index (BMI) 36.0-36.9, adult: Secondary | ICD-10-CM

## 2022-07-13 DIAGNOSIS — Z91014 Allergy to mammalian meats: Secondary | ICD-10-CM

## 2022-07-13 DIAGNOSIS — I422 Other hypertrophic cardiomyopathy: Secondary | ICD-10-CM | POA: Diagnosis present

## 2022-07-13 DIAGNOSIS — Z825 Family history of asthma and other chronic lower respiratory diseases: Secondary | ICD-10-CM

## 2022-07-13 DIAGNOSIS — Z7984 Long term (current) use of oral hypoglycemic drugs: Secondary | ICD-10-CM | POA: Diagnosis not present

## 2022-07-13 DIAGNOSIS — I480 Paroxysmal atrial fibrillation: Secondary | ICD-10-CM | POA: Diagnosis present

## 2022-07-13 DIAGNOSIS — K5730 Diverticulosis of large intestine without perforation or abscess without bleeding: Secondary | ICD-10-CM | POA: Diagnosis not present

## 2022-07-13 LAB — COMPREHENSIVE METABOLIC PANEL
ALT: 39 U/L (ref 0–44)
AST: 33 U/L (ref 15–41)
Albumin: 3.2 g/dL — ABNORMAL LOW (ref 3.5–5.0)
Alkaline Phosphatase: 231 U/L — ABNORMAL HIGH (ref 38–126)
Anion gap: 8 (ref 5–15)
BUN: 36 mg/dL — ABNORMAL HIGH (ref 8–23)
CO2: 14 mmol/L — ABNORMAL LOW (ref 22–32)
Calcium: 9.8 mg/dL (ref 8.9–10.3)
Chloride: 115 mmol/L — ABNORMAL HIGH (ref 98–111)
Creatinine, Ser: 1.29 mg/dL — ABNORMAL HIGH (ref 0.44–1.00)
GFR, Estimated: 45 mL/min — ABNORMAL LOW (ref >60)
Glucose, Bld: 97 mg/dL (ref 70–99)
Potassium: 3.8 mmol/L (ref 3.5–5.1)
Sodium: 137 mmol/L (ref 135–145)
Total Bilirubin: 0.6 mg/dL (ref 0.3–1.2)
Total Protein: 6.6 g/dL (ref 6.5–8.1)

## 2022-07-13 LAB — CBC WITH DIFFERENTIAL/PLATELET
Abs Immature Granulocytes: 0.29 10*3/uL — ABNORMAL HIGH (ref 0.00–0.07)
Basophils Absolute: 0.2 10*3/uL — ABNORMAL HIGH (ref 0.0–0.1)
Basophils Relative: 2 %
Eosinophils Absolute: 0.7 10*3/uL — ABNORMAL HIGH (ref 0.0–0.5)
Eosinophils Relative: 7 %
HCT: 28.3 % — ABNORMAL LOW (ref 36.0–46.0)
Hemoglobin: 8.9 g/dL — ABNORMAL LOW (ref 12.0–15.0)
Immature Granulocytes: 3 %
Lymphocytes Relative: 9 %
Lymphs Abs: 0.9 10*3/uL (ref 0.7–4.0)
MCH: 31.2 pg (ref 26.0–34.0)
MCHC: 31.4 g/dL (ref 30.0–36.0)
MCV: 99.3 fL (ref 80.0–100.0)
Monocytes Absolute: 0.7 10*3/uL (ref 0.1–1.0)
Monocytes Relative: 7 %
Neutro Abs: 8 10*3/uL — ABNORMAL HIGH (ref 1.7–7.7)
Neutrophils Relative %: 72 %
Platelets: 256 10*3/uL (ref 150–400)
RBC: 2.85 MIL/uL — ABNORMAL LOW (ref 3.87–5.11)
RDW: 16.8 % — ABNORMAL HIGH (ref 11.5–15.5)
WBC: 10.7 10*3/uL — ABNORMAL HIGH (ref 4.0–10.5)
nRBC: 0.3 % — ABNORMAL HIGH (ref 0.0–0.2)

## 2022-07-13 MED ORDER — POLYETHYLENE GLYCOL 3350 17 G PO PACK: 17.0000 g | PACK | Freq: Every day | ORAL | Status: DC | PRN

## 2022-07-13 MED ORDER — APIXABAN 5 MG PO TABS
5.0000 mg | ORAL_TABLET | Freq: Two times a day (BID) | ORAL | Status: DC
  Administered 2022-07-13 – 2022-07-21 (×16): 5 mg via ORAL
  Filled 2022-07-13 (×16): qty 1

## 2022-07-13 MED ORDER — OXYCODONE HCL 5 MG PO TABS
5.0000 mg | ORAL_TABLET | ORAL | Status: DC | PRN
  Administered 2022-07-13 – 2022-07-20 (×16): 5 mg via ORAL
  Filled 2022-07-13 (×16): qty 1

## 2022-07-13 MED ORDER — ONDANSETRON HCL 4 MG/2ML IJ SOLN: 4.0000 mg | Freq: Four times a day (QID) | INTRAMUSCULAR | Status: DC | PRN

## 2022-07-13 MED ORDER — METOPROLOL SUCCINATE ER 100 MG PO TB24
100.0000 mg | ORAL_TABLET | Freq: Two times a day (BID) | ORAL | Status: DC
  Administered 2022-07-13 – 2022-07-21 (×12): 100 mg via ORAL
  Filled 2022-07-13 (×15): qty 1

## 2022-07-13 MED ORDER — ACETAMINOPHEN 650 MG RE SUPP: 650.0000 mg | Freq: Four times a day (QID) | RECTAL | Status: DC | PRN

## 2022-07-13 MED ORDER — MIDODRINE HCL 5 MG PO TABS
5.0000 mg | ORAL_TABLET | Freq: Two times a day (BID) | ORAL | Status: DC
  Administered 2022-07-13 – 2022-07-21 (×16): 5 mg via ORAL
  Filled 2022-07-13 (×16): qty 1

## 2022-07-13 MED ORDER — EMPAGLIFLOZIN 10 MG PO TABS: 10.0000 mg | ORAL_TABLET | Freq: Every day | ORAL | Status: DC

## 2022-07-13 MED ORDER — POTASSIUM CHLORIDE CRYS ER 20 MEQ PO TBCR
20.0000 meq | EXTENDED_RELEASE_TABLET | Freq: Every day | ORAL | Status: DC
  Administered 2022-07-13 – 2022-07-20 (×8): 20 meq via ORAL
  Filled 2022-07-13 (×8): qty 1

## 2022-07-13 MED ORDER — TORSEMIDE 20 MG PO TABS: 20.0000 mg | ORAL_TABLET | Freq: Two times a day (BID) | ORAL | Status: DC

## 2022-07-13 MED ORDER — VITAMIN C 500 MG PO TABS
500.0000 mg | ORAL_TABLET | Freq: Every day | ORAL | Status: DC
  Administered 2022-07-14 – 2022-07-21 (×8): 500 mg via ORAL
  Filled 2022-07-13 (×8): qty 1

## 2022-07-13 MED ORDER — SODIUM CHLORIDE 0.9 % IV SOLN: 2.0000 g | INTRAVENOUS | Status: DC

## 2022-07-13 MED ORDER — SODIUM CHLORIDE 0.9 % IV SOLN
2.0000 g | Freq: Once | INTRAVENOUS | Status: AC
  Administered 2022-07-13: 2 g via INTRAVENOUS
  Filled 2022-07-13: qty 20

## 2022-07-13 MED ORDER — SERTRALINE HCL 50 MG PO TABS: 50.0000 mg | ORAL_TABLET | Freq: Every day | ORAL | Status: DC

## 2022-07-13 MED ORDER — HYDROMORPHONE HCL 1 MG/ML IJ SOLN
0.5000 mg | Freq: Once | INTRAMUSCULAR | Status: AC
  Administered 2022-07-13: 0.5 mg via INTRAVENOUS
  Filled 2022-07-13: qty 1

## 2022-07-13 MED ORDER — ACETAMINOPHEN 325 MG PO TABS
650.0000 mg | ORAL_TABLET | Freq: Four times a day (QID) | ORAL | Status: DC | PRN
  Administered 2022-07-13 – 2022-07-20 (×8): 650 mg via ORAL
  Filled 2022-07-13 (×8): qty 2

## 2022-07-13 MED ORDER — ONDANSETRON HCL 4 MG PO TABS: 4.0000 mg | ORAL_TABLET | Freq: Four times a day (QID) | ORAL | Status: DC | PRN

## 2022-07-13 MED ORDER — SODIUM CHLORIDE 0.9 % IV BOLUS
1000.0000 mL | Freq: Once | INTRAVENOUS | Status: AC
  Administered 2022-07-13: 1000 mL via INTRAVENOUS

## 2022-07-13 NOTE — ED Triage Notes (Signed)
Pt arrives from home via EMS with report of x1 month of bilateral lower extremity cellulitis with report of recent antibiotics. Today pt reported home health RN noticing worsening swelling. Pt reports not feeling well lately. No specific endorsement of worsening swelling or redness due to daily dressing changes. Denies fever.

## 2022-07-13 NOTE — H&P (Signed)
History and Physical    Patient: Christine Jacobson LOV:[id number] DOB: 1955/08/05 DOA: 07/13/2022 DOS: the patient was seen and examined on 07/13/2022 PCP: Karenann Cai, NP  Patient coming from: Home  Chief Complaint:  Chief Complaint  Patient presents with   BLE cellulitis   HPI: Christine Jacobson is a 67 y.o. female with medical history significant of chest pain, hypertension, hyperprolactinemia, mild hyperlipidemia, obesity, palpitations, pyoderma gangrenosum, class II obesity, history of sepsis due to cellulitis who presented to the emergency department complaints of bilateral lower extremity cellulitis that is not improving on recent oral antibiotic course (cephalexin).  She stated she had a fever associated with chills and malaise on Monday.  No further fevers since.  She is a 7 rhinorrhea, sore throat, wheezing or hemoptysis.  No chest pain, palpitations, diaphoresis, PND, orthopnea or pitting edema of the lower extremities.  No abdominal pain, nausea, emesis, diarrhea, constipation, melena or hematochezia.  No flank pain, dysuria, frequency or hematuria.  No polyuria, polydipsia, polyphagia or blurred vision.   Lab work: CBC 0 white count 10.7, hemoglobin 8.9 g/dL and platelets 884.  CMP with a chloride of 115, CO2 of 14 mmol/L with a normal anion gap.  The rest of the electrolytes were normal.  Glucose 97, BUN 36 and creatinine 1.29 mg/dL.  Albumin was 3.2 g/dL and alkaline phosphatase of 131 units/L.  The rest of the LFTs were normal.    ED course: Initial vital signs were temperature 97.7 F, pulse 72, respiration 18, BP 96/69 mmHg and O2 sat 100% on room air.  The patient received hydromorphone 0.5 mg IVP, NS 1000 mL bolus and ceftriaxone 2 g IVPB.  Review of Systems: As mentioned in the history of present illness. All other systems reviewed and are negative.  Past Medical History:  Diagnosis Date   Chest pain 11/22/2010   2D STRESS ECHO - EF 60%, peak stress EF 80%, normal, no  evidence for stress-induced ischemia   HOCM (hypertrophic obstructive cardiomyopathy) (HCC) 06/21/2011   2D ECHO - EF >55%, normal   HTN (hypertension)    Hyperprolactinemia (HCC)    dx in her 18, took meds, self d/c a while back   Liver function test abnormality    normal when repeated   Mild hyperlipidemia    Obesity    Palpitations    negative stress echo in November 2012 with normal LV function; mild LVH, proximal septal thickening with narrow LVOT and mild gradient; mild MR and TR; Cardionet showed PACs in November 2012   Pyoderma gangrenosa    Shortness of breath 07/11/2011   MET TEST   Past Surgical History:  Procedure Laterality Date   CARDIOVERSION N/A 02/01/2022   Procedure: CARDIOVERSION;  Surgeon: Thomasene Ripple, DO;  Location: [facility];  Service: Cardiovascular;  Laterality: N/A;   RIGHT HEART CATH N/A 10/15/2021   Procedure: RIGHT HEART CATH;  Surgeon: Elder Negus, MD;  Location: [facility] INVASIVE CV LAB;  Service: Cardiovascular;  Laterality: N/A;   SKIN GRAFT  2006   porcine, R leg   Social History:  reports that she has quit smoking. Her smoking use included cigarettes. She has a 4.00 pack-year smoking history. She has never used smokeless tobacco. She reports that she does not currently use alcohol after a past usage of about 1.0 standard drink of alcohol per week. She reports that she does not use drugs.  Allergies  Allergen Reactions   Beef-Derived Products     Does not eat pork d/t religious  reasons, but okay with receiving heparin.  Discussed with patient 06/05/22   Chicken Protein    Fish-Derived Products    Pork-Derived Products     Family History  Problem Relation Age of Onset   Heart disease Mother        endocarditis   Ovarian cancer Mother    Emphysema Father    Coronary artery disease Other        F in his 5   Diabetes Other        GP   Cancer Neg Hx     Prior to Admission medications   Medication Sig Start Date End Date Taking?  Authorizing Provider  acetaminophen (TYLENOL) 325 MG tablet Take 2 tablets (650 mg total) by mouth every 6 (six) hours as needed for mild pain or moderate pain. 06/09/22   Leroy Sea, MD  apixaban (ELIQUIS) 5 MG TABS tablet Take 1 tablet (5 mg total) by mouth 2 (two) times daily. 02/23/22   Tobb, Kardie, DO  ascorbic acid (VITAMIN C) 500 MG tablet Take 1 tablet (500 mg total) by mouth daily for 30 days 06/10/22   Leroy Sea, MD  cephALEXin (KEFLEX) 250 MG capsule Take 1 capsule (250 mg total) by mouth 3 (three) times daily. 06/09/22   Leroy Sea, MD  HYDROcodone-acetaminophen (NORCO/VICODIN) 5-325 MG tablet Take 1 tablet by mouth every 12 (twelve) hours as needed for severe pain. 06/09/22   Leroy Sea, MD  JARDIANCE 10 MG TABS tablet Take 10 mg by mouth daily. 01/27/22   [provider]  metoprolol succinate (TOPROL-XL) 100 MG 24 hr tablet TAKE ONE TABLET BY MOUTH TWICE A DAY WITH OR IMMEDIATELY FOLLOWING A MEAL Patient taking differently: Take 100 mg by mouth 2 (two) times daily. 03/02/22   Tobb, Kardie, DO  midodrine (PROAMATINE) 5 MG tablet Take 1 tablet (5 mg total) by mouth 2 (two) times daily with a meal. 06/09/22   Leroy Sea, MD  polyethylene glycol powder (GLYCOLAX/MIRALAX) 17 GM/SCOOP powder Take 1 capful (17 g) by mouth daily as needed for mild constipation. 06/09/22   Leroy Sea, MD  potassium chloride SA (KLOR-CON M) 20 MEQ tablet Take 1 tablet (20 mEq total) by mouth daily. 06/09/22   Leroy Sea, MD  sertraline (ZOLOFT) 50 MG tablet Take 1 tablet (50 mg total) by mouth at bedtime. 10/29/21   Yates Decamp, MD  torsemide (DEMADEX) 20 MG tablet Take 1 tablet (20 mg total) by mouth 2 (two) times daily. 06/17/22 09/15/22  Tobb, Lavona Mound, DO  Zinc Sulfate 220 (50 Zn) MG TABS Take 1 tablet (220 mg total) by mouth daily for 30 days 06/10/22   Leroy Sea, MD    Physical Exam: Vitals:   07/13/22 1102 07/13/22 1415 07/13/22 1430 07/13/22 1511  BP:  96/69 119/70 123/82   Pulse:      Resp:  (!) 23 (!) 21   Temp:    (!) 97.5 F (36.4 C)  TempSrc:    Oral  Weight:      Height:       Physical Exam Vitals and nursing note reviewed.  Constitutional:      General: She is awake. She is not in acute distress. HENT:     Head: Normocephalic.  Eyes:     General: No scleral icterus.    Pupils: Pupils are equal, round, and reactive to light.  Neck:     Vascular: No JVD.  Cardiovascular:  Rate and Rhythm: Normal rate and regular rhythm.     Heart sounds: S1 normal and S2 normal.  Pulmonary:     Effort: Pulmonary effort is normal.     Breath sounds: No wheezing, rhonchi or rales.  Abdominal:     General: Bowel sounds are normal. There is no distension.     Palpations: Abdomen is soft.     Tenderness: There is no abdominal tenderness. There is no guarding.  Musculoskeletal:     Cervical back: Neck supple.     Right lower leg: Edema present.     Left lower leg: Edema present.  Skin:    General: Skin is warm and dry.     Findings: Erythema and lesion present.     Comments: Bilateral tibial cellulitis with erythema, edema, calor and TTP, serous discharge and hemosiderin staining.  Please see pictures below.  Neurological:     General: No focal deficit present.     Mental Status: She is alert and oriented to person, place, and time.  Psychiatric:        Mood and Affect: Mood normal.        Behavior: Behavior normal. Behavior is cooperative.          Data Reviewed:  Results are pending, will review when available.  Assessment and Plan: Principal Problem:   Cellulitis of both lower extremities Admit to telemetry/inpatient. Continue ceftriaxone 2 g IVPB daily. Analgesics as needed. Follow-up blood culture and sensitivity. Follow-up CBC and chemistry in the morning. Consult wound and ostomy care. Consult TOC team.  Active Problems:   Hypocarbia No anion gap. May have been hyperventilating before blood draw Will  follow-up serum bicarbonate/CO2.    Mild hyperlipidemia Currently on diet only. Follow-up with primary care provider.    Persistent atrial fibrillation (HCC) CHA2DS2-VASc Score of at least 5. Continue apixaban 5 mg p.o. twice daily. Continue metoprolol for rate control.    Chronic diastolic heart failure (HCC) Mild lower extremity edema, does not look overloaded. She has not been using torsemide at home. Continue metoprolol succinate 100 mg p.o. twice daily.    Hypertension On metoprolol succinate twice daily. Monitor blood pressure and heart rate.    Normocytic anemia Monitor hematocrit and hemoglobin.    Obesity, class II Current BMI 36.56 kg/m. Lifestyle modifications. Follow-up with primary care provider.     Advance Care Planning:   Code Status: Full Code   Consults: Wound/ostomy care, physical therapy and TOC team.  Family Communication:   Severity of Illness: The appropriate patient status for this patient is INPATIENT. Inpatient status is judged to be reasonable and necessary in order to provide the required intensity of service to ensure the patient's safety. The patient's presenting symptoms, physical exam findings, and initial radiographic and laboratory data in the context of their chronic comorbidities is felt to place them at high risk for further clinical deterioration. Furthermore, it is not anticipated that the patient will be medically stable for discharge from the hospital within 2 midnights of admission.   * I certify that at the point of admission it is my clinical judgment that the patient will require inpatient hospital care spanning beyond 2 midnights from the point of admission due to high intensity of service, high risk for further deterioration and high frequency of surveillance required.*  Author: Bobette Mo, MD 07/13/2022 3:25 PM  For on call review [email or web address].   This document was prepared using Dragon voice recognition software  and may contain some unintended  transcription errors.

## 2022-07-13 NOTE — ED Provider Notes (Signed)
[place] EMERGENCY DEPARTMENT AT [facility] Provider Note   CSN: [id number] Arrival date & time: 07/13/22  1052     History  Chief Complaint  Patient presents with   BLE cellulitis    Christine Jacobson is a 67 y.o. female.  Christine Jacobson is a 67 year old female with a history of CHF, HCOM, Afib, HTN, hyperlipidemia, chronic bilateral lower leg wounds presents to the ED because patient states her home health nurse said "leg wounds are worse". Patient was unable to specify what has gotten worse. Patient reports fever and chills 2 days ago that resolved. Today pt denies fever, chills, leg pain, SOB, chest pain or abdominal pain. Patient states she's "actually feeling better today". Patient was recently admitted for ARF on 05/16 and had acute on chronic bilateral lower extremities cellulitis.  The history is provided by the patient. No language interpreter was used.       Home Medications Prior to Admission medications   Medication Sig Start Date End Date Taking? Authorizing Provider  acetaminophen (TYLENOL) 325 MG tablet Take 2 tablets (650 mg total) by mouth every 6 (six) hours as needed for mild pain or moderate pain. 06/09/22   Leroy Sea, MD  apixaban (ELIQUIS) 5 MG TABS tablet Take 1 tablet (5 mg total) by mouth 2 (two) times daily. 02/23/22   Tobb, Kardie, DO  ascorbic acid (VITAMIN C) 500 MG tablet Take 1 tablet (500 mg total) by mouth daily for 30 days 06/10/22   Leroy Sea, MD  cephALEXin (KEFLEX) 250 MG capsule Take 1 capsule (250 mg total) by mouth 3 (three) times daily. 06/09/22   Leroy Sea, MD  HYDROcodone-acetaminophen (NORCO/VICODIN) 5-325 MG tablet Take 1 tablet by mouth every 12 (twelve) hours as needed for severe pain. 06/09/22   Leroy Sea, MD  JARDIANCE 10 MG TABS tablet Take 10 mg by mouth daily. 01/27/22   [provider]  metoprolol succinate (TOPROL-XL) 100 MG 24 hr tablet TAKE ONE TABLET BY MOUTH TWICE A DAY WITH OR  IMMEDIATELY FOLLOWING A MEAL Patient taking differently: Take 100 mg by mouth 2 (two) times daily. 03/02/22   Tobb, Kardie, DO  midodrine (PROAMATINE) 5 MG tablet Take 1 tablet (5 mg total) by mouth 2 (two) times daily with a meal. 06/09/22   Leroy Sea, MD  polyethylene glycol powder (GLYCOLAX/MIRALAX) 17 GM/SCOOP powder Take 1 capful (17 g) by mouth daily as needed for mild constipation. 06/09/22   Leroy Sea, MD  potassium chloride SA (KLOR-CON M) 20 MEQ tablet Take 1 tablet (20 mEq total) by mouth daily. 06/09/22   Leroy Sea, MD  sertraline (ZOLOFT) 50 MG tablet Take 1 tablet (50 mg total) by mouth at bedtime. 10/29/21   Yates Decamp, MD  torsemide (DEMADEX) 20 MG tablet Take 1 tablet (20 mg total) by mouth 2 (two) times daily. 06/17/22 09/15/22  Tobb, Lavona Mound, DO  Zinc Sulfate 220 (50 Zn) MG TABS Take 1 tablet (220 mg total) by mouth daily for 30 days 06/10/22   Leroy Sea, MD      Allergies    Beef-derived products, Chicken protein, Fish-derived products, and Pork-derived products    Review of Systems   Review of Systems  All other systems reviewed and are negative.   Physical Exam Updated Vital Signs BP 96/69   Pulse 72   Temp 97.7 F (36.5 C) (Oral)   Resp 18   Ht 5\' 4"  (1.626 m)  Wt 96.6 kg   BMI 36.56 kg/m  Physical Exam Vitals reviewed.  Constitutional:      Appearance: Normal appearance.  HENT:     Right Ear: Tympanic membrane normal.     Left Ear: Tympanic membrane normal.     Nose: Nose normal.     Mouth/Throat:     Mouth: Mucous membranes are moist.  Eyes:     Pupils: Pupils are equal, round, and reactive to light.  Cardiovascular:     Rate and Rhythm: Normal rate and regular rhythm.  Pulmonary:     Effort: Pulmonary effort is normal.  Abdominal:     General: Abdomen is flat.  Musculoskeletal:        General: Swelling and tenderness present.     Cervical back: Normal range of motion.     Comments: Erythematous, bilat lower legs,     Skin:    General: Skin is warm.  Neurological:     General: No focal deficit present.     Mental Status: She is alert.  Psychiatric:        Mood and Affect: Mood normal.     ED Results / Procedures / Treatments   Labs (all labs ordered are listed, but only abnormal results are displayed) Labs Reviewed  CULTURE, BLOOD (ROUTINE X 2)  CULTURE, BLOOD (ROUTINE X 2)  CBC WITH DIFFERENTIAL/PLATELET  COMPREHENSIVE METABOLIC PANEL    EKG None  Radiology No results found.  Procedures Procedures    Medications Ordered in ED Medications - No data to display  ED Course/ Medical Decision Making/ A&P                             Medical Decision Making Patient sent here by wound care nurse due to worsening cellulitis of lower extremities.  Patient was hospitalized in May for the same.  Patient finished a course of antibiotics and has wound care coming to her house daily.  Amount and/or Complexity of Data Reviewed External Data Reviewed: notes.    Details: Hospitalist notes reviewed Labs: ordered.    Details: Labs ordered reviewed and interpreted patient has an elevated white blood cell count of 10.7 and creatinine are slightly elevated from baseline  Risk Prescription drug management. Decision regarding hospitalization. Risk Details: Patient appears to have worsening cellulitis than when she was hospitalized.  Patient is started on Rocephin 2 g hospitalist consulted for admission                Final Clinical Impression(s) / ED Diagnoses Final diagnoses:  Cellulitis of both lower extremities    Rx / DC Orders ED Discharge Orders     None         Osie Cheeks 07/13/22 1454    Vanetta Mulders, MD 07/13/22 1529

## 2022-07-13 NOTE — ED Notes (Signed)
ED TO INPATIENT HANDOFF REPORT  Name/Age/Gender Christine Jacobson 67 y.o. female  Code Status Code Status History     Date Active Date Inactive Code Status Order ID Comments User Context   06/02/2022 1711 06/09/2022 1845 DNR [id number]  Rodolph Bong, MD ED   10/13/2021 1632 10/15/2021 1929 Full Code [id number]  Jonah Blue, MD ED    Questions for Most Recent Historical Code Status (Order [id number])     Question Answer   If patient has no pulse and is not breathing Do Not Attempt Resuscitation   If patient has a pulse and/or is breathing: Medical Treatment Goals MEDICAL INTERVENTIONS DESIRED: Use advanced airway interventions, mechanical ventilation or cardioversion in appropriate circumstances; Use medication/IV fluids as indicated; Provide comfort medications; Transfer to Progressive/Stepdown/ICU as indicated.   Consent: Discussion documented in EHR or advanced directives reviewed            Home/SNF/Other Home  Chief Complaint Cellulitis of both lower extremities [L03.115, L03.116]  Level of Care/Admitting Diagnosis ED Disposition     ED Disposition  Admit   Condition  --   Comment  Hospital Area: [facility] [[id number]]  Level of Care: Telemetry [5]  Admit to tele based on following criteria: Other see comments  Comments: History of A-fib.  May admit patient to [facility] or [facility] if equivalent level of care is available:: No  Covid Evaluation: Asymptomatic - no recent exposure (last 10 days) testing not required  Diagnosis: Cellulitis of both lower extremities [0865784]  Admitting Physician: Bobette Mo [[id number]]  Attending Physician: Bobette Mo [[id number]]  Certification:: I certify this patient will need inpatient services for at least 2 midnights  Estimated Length of Stay: 2          Medical History Past Medical History:  Diagnosis Date   Chest pain 11/22/2010   2D STRESS ECHO - EF 60%, peak stress EF 80%,  normal, no evidence for stress-induced ischemia   HOCM (hypertrophic obstructive cardiomyopathy) (HCC) 06/21/2011   2D ECHO - EF >55%, normal   HTN (hypertension)    Hyperprolactinemia (HCC)    dx in her 18, took meds, self d/c a while back   Liver function test abnormality    normal when repeated   Mild hyperlipidemia    Obesity    Palpitations    negative stress echo in November 2012 with normal LV function; mild LVH, proximal septal thickening with narrow LVOT and mild gradient; mild MR and TR; Cardionet showed PACs in November 2012   Pyoderma gangrenosa    Shortness of breath 07/11/2011   MET TEST    Allergies Allergies  Allergen Reactions   Beef-Derived Products     Does not eat pork d/t religious reasons, but okay with receiving heparin.  Discussed with patient 06/05/22   Chicken Protein    Fish-Derived Products    Pork-Derived Products     IV Location/Drains/Wounds Patient Lines/Drains/Airways Status     Active Line/Drains/Airways     Name Placement date Placement time Site Days   Peripheral IV 07/13/22 20 G Anterior;Left Forearm 07/13/22  1222  Forearm  less than 1   Pressure Injury 06/06/22 Buttocks Posterior;Right Stage 2 -  Partial thickness loss of dermis presenting as a shallow open injury with a red, pink wound bed without slough. 06/06/22  1930  -- 37   Wound / Incision (Open or Dehisced) 10/13/21 Other (Comment) Pretibial Left;Posterior red, difuse, uniform rash-like appearance with purulence visible on the anterior  lateral left leg, full thickness wound on left posterior calf 10/13/21  1950  Pretibial  273   Wound / Incision (Open or Dehisced) 06/02/22 Pretibial Left;Lateral 06/02/22  1800  Pretibial  41   Wound / Incision (Open or Dehisced) 06/02/22 Non-pressure wound Tibial Posterior;Right open wound with sloughing of yellow/green area 06/02/22  1900  Tibial  41            Labs/Imaging Results for orders placed or performed during the hospital encounter of  07/13/22 (from the past 48 hour(s))  CBC with Differential     Status: Abnormal   Collection Time: 07/13/22 12:23 PM  Result Value Ref Range   WBC 10.7 (H) 4.0 - 10.5 K/uL   RBC 2.85 (L) 3.87 - 5.11 MIL/uL   Hemoglobin 8.9 (L) 12.0 - 15.0 g/dL   HCT 16.1 (L) 09.6 - 04.5 %   MCV 99.3 80.0 - 100.0 fL   MCH 31.2 26.0 - 34.0 pg   MCHC 31.4 30.0 - 36.0 g/dL   RDW 40.9 (H) 81.1 - 91.4 %   Platelets 256 150 - 400 K/uL   nRBC 0.3 (H) 0.0 - 0.2 %   Neutrophils Relative % 72 %   Neutro Abs 8.0 (H) 1.7 - 7.7 K/uL   Lymphocytes Relative 9 %   Lymphs Abs 0.9 0.7 - 4.0 K/uL   Monocytes Relative 7 %   Monocytes Absolute 0.7 0.1 - 1.0 K/uL   Eosinophils Relative 7 %   Eosinophils Absolute 0.7 (H) 0.0 - 0.5 K/uL   Basophils Relative 2 %   Basophils Absolute 0.2 (H) 0.0 - 0.1 K/uL   Immature Granulocytes 3 %   Abs Immature Granulocytes 0.29 (H) 0.00 - 0.07 K/uL    Comment: Performed at [facility], [address], [place], [place] [address]  Comprehensive metabolic panel     Status: Abnormal   Collection Time: 07/13/22 12:23 PM  Result Value Ref Range   Sodium 137 135 - 145 mmol/L   Potassium 3.8 3.5 - 5.1 mmol/L   Chloride 115 (H) 98 - 111 mmol/L   CO2 14 (L) 22 - 32 mmol/L   Glucose, Bld 97 70 - 99 mg/dL    Comment: Glucose reference range applies only to samples taken after fasting for at least 8 hours.   BUN 36 (H) 8 - 23 mg/dL   Creatinine, Ser 6.21 (H) 0.44 - 1.00 mg/dL   Calcium 9.8 8.9 - 30.8 mg/dL   Total Protein 6.6 6.5 - 8.1 g/dL   Albumin 3.2 (L) 3.5 - 5.0 g/dL   AST 33 15 - 41 U/L   ALT 39 0 - 44 U/L   Alkaline Phosphatase 231 (H) 38 - 126 U/L   Total Bilirubin 0.6 0.3 - 1.2 mg/dL   GFR, Estimated 45 (L) >60 mL/min    Comment: (NOTE) Calculated using the CKD-EPI Creatinine Equation (2021)    Anion gap 8 5 - 15    Comment: Performed at [facility], [address] [place], [place] [address]   No results found.  Pending  Labs Unresulted Labs (From admission, onward)     Start     Ordered   07/13/22 1137  Blood culture (routine x 2)  BLOOD CULTURE X 2,   R (with STAT occurrences)      07/13/22 1136            Vitals/Pain Today's Vitals   07/13/22 1059 07/13/22 1102 07/13/22 1415 07/13/22 1430  BP:  96/69 119/70 123/82  Pulse:      Resp:   (!) 23 (!) 21  Temp:      TempSrc:      Weight: 96.6 kg     Height: 5\' 4"  (1.626 m)     PainSc: 1        Isolation Precautions No active isolations  Medications Medications  cefTRIAXone (ROCEPHIN) 2 g in sodium chloride 0.9 % 100 mL IVPB (0 g Intravenous Stopped 07/13/22 1446)  sodium chloride 0.9 % bolus 1,000 mL (1,000 mLs Intravenous New Bag/Given 07/13/22 1412)  HYDROmorphone (DILAUDID) injection 0.5 mg (0.5 mg Intravenous Given 07/13/22 1450)    Mobility walks with device

## 2022-07-14 DIAGNOSIS — L03115 Cellulitis of right lower limb: Secondary | ICD-10-CM | POA: Diagnosis not present

## 2022-07-14 DIAGNOSIS — L03116 Cellulitis of left lower limb: Secondary | ICD-10-CM | POA: Diagnosis not present

## 2022-07-14 LAB — COMPREHENSIVE METABOLIC PANEL WITH GFR
ALT: 33 U/L (ref 0–44)
AST: 27 U/L (ref 15–41)
Albumin: 2.7 g/dL — ABNORMAL LOW (ref 3.5–5.0)
Alkaline Phosphatase: 197 U/L — ABNORMAL HIGH (ref 38–126)
Anion gap: 6 (ref 5–15)
BUN: 32 mg/dL — ABNORMAL HIGH (ref 8–23)
CO2: 16 mmol/L — ABNORMAL LOW (ref 22–32)
Calcium: 9.4 mg/dL (ref 8.9–10.3)
Chloride: 116 mmol/L — ABNORMAL HIGH (ref 98–111)
Creatinine, Ser: 1.31 mg/dL — ABNORMAL HIGH (ref 0.44–1.00)
GFR, Estimated: 44 mL/min — ABNORMAL LOW (ref >60)
Glucose, Bld: 120 mg/dL — ABNORMAL HIGH (ref 70–99)
Potassium: 3.8 mmol/L (ref 3.5–5.1)
Sodium: 138 mmol/L (ref 135–145)
Total Bilirubin: 0.5 mg/dL (ref 0.3–1.2)
Total Protein: 5.7 g/dL — ABNORMAL LOW (ref 6.5–8.1)

## 2022-07-14 LAB — CBC
HCT: 25.4 % — ABNORMAL LOW (ref 36.0–46.0)
Hemoglobin: 7.9 g/dL — ABNORMAL LOW (ref 12.0–15.0)
MCH: 31.6 pg (ref 26.0–34.0)
MCHC: 31.1 g/dL (ref 30.0–36.0)
MCV: 101.6 fL — ABNORMAL HIGH (ref 80.0–100.0)
Platelets: 248 K/uL (ref 150–400)
RBC: 2.5 MIL/uL — ABNORMAL LOW (ref 3.87–5.11)
RDW: 16.9 % — ABNORMAL HIGH (ref 11.5–15.5)
WBC: 11.3 K/uL — ABNORMAL HIGH (ref 4.0–10.5)
nRBC: 1.4 % — ABNORMAL HIGH (ref 0.0–0.2)

## 2022-07-14 LAB — BRAIN NATRIURETIC PEPTIDE: B Natriuretic Peptide: 1062.2 pg/mL — ABNORMAL HIGH (ref 0.0–100.0)

## 2022-07-14 MED ORDER — TORSEMIDE 20 MG PO TABS
20.0000 mg | ORAL_TABLET | Freq: Two times a day (BID) | ORAL | Status: DC
  Administered 2022-07-14 – 2022-07-16 (×4): 20 mg via ORAL
  Filled 2022-07-14 (×4): qty 1

## 2022-07-14 MED ORDER — CEFAZOLIN SODIUM-DEXTROSE 1-4 GM/50ML-% IV SOLN
1.0000 g | Freq: Three times a day (TID) | INTRAVENOUS | Status: DC
  Administered 2022-07-14 – 2022-07-20 (×19): 1 g via INTRAVENOUS
  Filled 2022-07-14 (×20): qty 50

## 2022-07-14 MED ORDER — FUROSEMIDE 40 MG PO TABS: 40.0000 mg | ORAL_TABLET | Freq: Every day | ORAL | Status: DC

## 2022-07-14 MED ORDER — ORAL CARE MOUTH RINSE: 15.0000 mL | OROMUCOSAL | Status: DC | PRN

## 2022-07-14 NOTE — Consult Note (Addendum)
WOC Nurse Consult Note: this patient is known to [facility] team from prior admissions; has history of lymphedema and pyoderma gangrenosum.  [facility], last seen there 10/2021.  Has most recently been followed by home health who have been placing unna boots.  Currently being treated for cellulitis with IV antibiotics  Reason for Consult:  bilateral lower extremity wounds Wound type: lymphedema/venous insufficiency  Pressure Injury POA: NA, leg wounds not related to pressure  Measurement: Bilateral legs with multiple areas of partial and full thickness skin loss; largest areas  L posterior lower leg 15 cm x 12 cm x 0.1 cm; L anterior lower leg 2 cm x 2 cm x 0.1 cm; R posterior leg 15 cm x 8 cm x 0.1 cm  Wound bed: red, moist  Drainage (amount, consistency, odor) moderate amount of serosanguinous  Periwound: erythema, edema, chronic skin changes  Dressing procedure/placement/frequency: Cleanse bilateral lower legs with soap and water, pat dry.  Apply Aquacel AG [place] [phone]) to open weeping wound beds daily.  Cover with ABD pads then wrap with Kerlix roll gauze beginning just above toes and ending right below knees.  Secure with Ace wrap wrapped in same fashion as Kerlix for slight compression.    POC discussed with patient, bedside nurse and primary MD.  WOC team will not follow at this time.  Please re-consult if further needs arise.   Thank you,     Priscella Mann MSN, RN-BC, [employer] [phone]

## 2022-07-14 NOTE — Progress Notes (Signed)
PROGRESS NOTE    Christine Jacobson  ZOX:[id number] DOB: 1955/02/04 DOA: 07/13/2022 PCP: Karenann Cai, NP  Chief Complaint  Patient presents with   BLE cellulitis    Brief Narrative:   Christine Jacobson is Christine Jacobson 67 y.o. Jacobson with medical history significant of chest pain, hypertension, hyperprolactinemia, mild hyperlipidemia, obesity, palpitations, pyoderma gangrenosum, class II obesity, history of sepsis due to cellulitis who presented to the emergency department complaints of bilateral lower extremity cellulitis that is not improving on recent oral antibiotic course (cephalexin).  She stated she had Christine Jacobson fever associated with chills and malaise on Monday.  No further fevers since.  She is Christine Jacobson 9 rhinorrhea, sore throat, wheezing or hemoptysis.  No chest pain, palpitations, diaphoresis, PND, orthopnea or pitting edema of the lower extremities.  No abdominal pain, nausea, emesis, diarrhea, constipation, melena or hematochezia.  No flank pain, dysuria, frequency or hematuria.  No polyuria, polydipsia, polyphagia or blurred vision.    Lab work: CBC 0 white count 10.7, hemoglobin 8.9 g/dL and platelets 811.  CMP with [facility] chloride of 115, CO2 of 14 mmol/L with Christine Jacobson normal anion gap.  The rest of the electrolytes were normal.  Glucose 97, BUN 36 and creatinine 1.29 mg/dL.  Albumin was 3.2 g/dL and alkaline phosphatase of 131 units/L.  The rest of the LFTs were normal.    ED course: Initial vital signs were temperature 97.7 F, pulse 72, respiration 18, BP 96/69 mmHg and O2 sat 100% on room air.  The patient received hydromorphone 0.5 mg IVP, NS 1000 mL bolus and ceftriaxone 2 g IVPB.    Assessment & Plan:   Principal Problem:   Cellulitis of both lower extremities Active Problems:   Mild hyperlipidemia   Hypertension   Chronic diastolic heart failure (HCC)   Obesity   Persistent atrial fibrillation (HCC)   Hypocarbia   Normocytic anemia  Bilateral LE Cellulitis Will monitor on ancef Mild leukocytosis,  trend Follow blood cultures  Bilateral LE Edema  Will resume home diuretic Follow BNP Prior echo with severe concentric hypertrophy of the left ventricle, hypertrophic cardiomyopathy, normal EF  Hypertrophic Cardiomyopathy  Chronic Diastolic Heart Failure  Moderate Pulmonary Hypertension  Last saw Dr. Servando Salina, was supposed to be on torsemide 20 mg BID Will order this for her here  Non Anion Gap Metabolic Acidosis  CKD IIIa Will continue to monitor, some mild CKD May need bicarb, will follow   Atrial Fibrillation Metoprolol, eliquis   Hypotension On midodrine, follow blood pressure  Anemia Downtrending, will continue to monitor  Obesity Body mass index is 36.56 kg/m.  Dyslipidemia Follow     DVT prophylaxis: eliquis Code Status: DNR Family Communication: none Disposition:   Status is: Inpatient Remains inpatient appropriate because: need for IV abx   Consultants:  none  Procedures:  none  Antimicrobials:  Anti-infectives (From admission, onward)    Start     Dose/Rate Route Frequency Ordered Stop   07/14/22 1400  cefTRIAXone (ROCEPHIN) 2 g in sodium chloride 0.9 % 100 mL IVPB  Status:  Discontinued        2 g 200 mL/hr over 30 Minutes Intravenous Every 24 hours 07/13/22 1534 07/14/22 0755   07/14/22 0900  ceFAZolin (ANCEF) IVPB 1 g/50 mL premix        1 g 100 mL/hr over 30 Minutes Intravenous Every 8 hours 07/14/22 0755 07/21/22 0859   07/13/22 1400  cefTRIAXone (ROCEPHIN) 2 g in sodium chloride 0.9 % 100 mL IVPB  2 g 200 mL/hr over 30 Minutes Intravenous  Once 07/13/22 1348 07/13/22 1446       Subjective: No new complaints  Objective: Vitals:   07/14/22 0456 07/14/22 0902 07/14/22 1027 07/14/22 1228  BP: 111/64 103/64 114/68 102/70  Pulse: 76 79 76 74  Resp: 18   18  Temp: 98 F (36.7 C)   98.3 F (36.8 C)  TempSrc:    Oral  SpO2: 96%   96%  Weight:      Height:        Intake/Output Summary (Last 24 hours) at 07/14/2022  1521 Last data filed at 07/14/2022 1455 Gross per 24 hour  Intake 750 ml  Output 300 ml  Net 450 ml   Filed Weights   07/13/22 1059 07/13/22 1605  Weight: 96.6 kg 96.6 kg    Examination:  General exam: Appears calm and comfortable  Respiratory system: unlabored Cardiovascular system: RRR Gastrointestinal system: Abdomen is nondistended, soft and nontender. Central nervous system: Alert and oriented. No focal neurological deficits. Extremities: bilateral LE's with dressings intact, palpable DP pulses     Data Reviewed: I have personally reviewed following labs and imaging studies  CBC: Recent Labs  Lab 07/13/22 1223 07/14/22 0423  WBC 10.7* 11.3*  NEUTROABS 8.0*  --   HGB 8.9* 7.9*  HCT 28.3* 25.4*  MCV 99.3 101.6*  PLT 256 248    Basic Metabolic Panel: Recent Labs  Lab 07/13/22 1223 07/14/22 0423  NA 137 138  K 3.8 3.8  CL 115* 116*  CO2 14* 16*  GLUCOSE 97 120*  BUN 36* 32*  CREATININE 1.29* 1.31*  CALCIUM 9.8 9.4    GFR: Estimated Creatinine Clearance: 45.7 mL/min (Christine Jacobson) (by C-G formula based on SCr of 1.31 mg/dL (H)).  Liver Function Tests: Recent Labs  Lab 07/13/22 1223 07/14/22 0423  AST 33 27  ALT 39 33  ALKPHOS 231* 197*  BILITOT 0.6 0.5  PROT 6.6 5.7*  ALBUMIN 3.2* 2.7*    CBG: No results for input(s): "GLUCAP" in the last 168 hours.   Recent Results (from the past 240 hour(s))  Blood culture (routine x 2)     Status: None (Preliminary result)   Collection Time: 07/13/22 12:15 PM   Specimen: BLOOD  Result Value Ref Range Status   Specimen Description   Final    BLOOD BLOOD LEFT FOREARM Performed at [facility], [address] [place], [place] [address]    Special Requests   Final    BOTTLES DRAWN AEROBIC AND ANAEROBIC Blood Culture adequate volume Performed at [facility], [address], [place], [place] [address]    Culture   Final    NO GROWTH < 24 HOURS Performed at [facility], [address], [place], [place] [address]    Report Status PENDING  Incomplete  Blood culture (routine x 2)     Status: None (Preliminary result)   Collection Time: 07/13/22 12:23 PM   Specimen: BLOOD  Result Value Ref Range Status   Specimen Description   Final    BLOOD BLOOD RIGHT ARM Performed at [facility], [address], [place], [place] [address]    Special Requests   Final    BOTTLES DRAWN AEROBIC AND ANAEROBIC Blood Culture adequate volume Performed at [facility], [address], [place], [place] [address]    Culture   Final    NO GROWTH < 24 HOURS Performed at [facility], [address], [place], [place] [address]    Report Status PENDING  Incomplete         Radiology Studies: No results found.      Scheduled Meds:  apixaban  5 mg Oral BID   ascorbic acid  500 mg Oral Daily   metoprolol succinate  100 mg Oral BID   midodrine  5 mg Oral BID WC   potassium chloride SA  20 mEq Oral Daily   Continuous Infusions:   ceFAZolin (ANCEF) IV 1 g (07/14/22 0918)     LOS: 1 day    Time spent: over 30 min    Christine Nicks, MD [facility]   To contact the attending provider between 7A-7P or the covering provider during after hours 7P-7A, please log into the web site [email or web address] and access using universal [place] password for that web site. If you do not have the password, please call the hospital operator.  07/14/2022, 3:21 PM

## 2022-07-14 NOTE — Evaluation (Signed)
Physical Therapy Evaluation Patient Details Name: Christine Jacobson MRN: [id number] DOB: 1955-02-26 Today's Date: 07/14/2022  History of Present Illness  67 y.o. female with medical history significant of chest pain, hypertension, hyperprolactinemia, mild hyperlipidemia, obesity, palpitations, pyoderma gangrenosum, class II obesity, history of sepsis due to cellulitis who presented to the emergency department complaints of bilateral lower extremity cellulitis that is not improving on recent oral antibiotic course. Pt had a recent admission 5/16-5/23/24 for AKI.  Clinical Impression  Pt admitted with above diagnosis. Pt ambulated 71' + 12' with RW, distance limited by fatigue, no loss of balance. Pt performed seated BLE strengthening exercises. She stated she'll be able to manage at home with transportation assistance from friends and [facility] nursing and HHPT.  Pt currently with functional limitations due to the deficits listed below (see PT Problem List). Pt will benefit from acute skilled PT to increase their independence and safety with mobility to allow discharge.          Recommendations for follow up therapy are one component of a multi-disciplinary discharge planning process, led by the attending physician.  Recommendations may be updated based on patient status, additional functional criteria and insurance authorization.  Follow Up Recommendations       Assistance Recommended at Discharge Intermittent Supervision/Assistance  Patient can return home with the following  A little help with bathing/dressing/bathroom;Assistance with cooking/housework;Assist for transportation;Help with stairs or ramp for entrance    Equipment Recommendations None recommended by PT  Recommendations for Other Services       Functional Status Assessment Patient has had a recent decline in their functional status and demonstrates the ability to make significant improvements in function in a reasonable and predictable  amount of time.     Precautions / Restrictions Precautions Precautions: Fall Precaution Comments: pt denies falls in past 6 months Restrictions Weight Bearing Restrictions: No      Mobility  Bed Mobility Overal bed mobility: Modified Independent             General bed mobility comments: HOB up    Transfers Overall transfer level: Needs assistance Equipment used: Rolling walker (2 wheels) Transfers: Sit to/from Stand Sit to Stand: Supervision           General transfer comment: VCs hand placement    Ambulation/Gait Ambulation/Gait assistance: Supervision Gait Distance (Feet): 18 Feet Assistive device: Rolling walker (2 wheels) Gait Pattern/deviations: Step-through pattern, Decreased stride length, Trunk flexed Gait velocity: decr     General Gait Details: 79' + 12' with seated rest, distance limited by fatigue, no loss of balance  Stairs            Wheelchair Mobility    Modified Rankin (Stroke Patients Only)       Balance Overall balance assessment: Modified Independent, Needs assistance         Standing balance support: Bilateral upper extremity supported, Reliant on assistive device for balance, During functional activity                                 Pertinent Vitals/Pain Pain Assessment Pain Assessment: 0-10 Pain Score: 2  Pain Location: B feet Pain Descriptors / Indicators: Sore Pain Intervention(s): Limited activity within patient's tolerance, Monitored during session    Home Living Family/patient expects to be discharged to:: Private residence Living Arrangements: Alone   Type of Home: Apartment Home Access: Ramped entrance       Home Layout: One level  Home Equipment: Agricultural consultant (2 wheels)      Prior Function Prior Level of Function : Independent/Modified Independent             Mobility Comments: walks with RW; doesn't drive, friends take her shopping, uses a shuttle bus to MD  appointments ADLs Comments: grocery shops through [place], bird bathes at baseline, independent with ADLs and iADLs, kitchen and laundry are communal     Hand Dominance        Extremity/Trunk Assessment   Upper Extremity Assessment Upper Extremity Assessment: Overall WFL for tasks assessed    Lower Extremity Assessment Lower Extremity Assessment: Overall WFL for tasks assessed    Cervical / Trunk Assessment Cervical / Trunk Assessment: Normal  Communication   Communication: No difficulties  Cognition Arousal/Alertness: Awake/alert Behavior During Therapy: WFL for tasks assessed/performed Overall Cognitive Status: Within Functional Limits for tasks assessed                                          General Comments      Exercises General Exercises - Lower Extremity Ankle Circles/Pumps: AROM, Both, 20 reps, Supine Long Arc Quad: AROM, Both, 10 reps, Seated Hip Flexion/Marching: AROM, Both, 10 reps, Seated   Assessment/Plan    PT Assessment Patient needs continued PT services  PT Problem List Decreased activity tolerance;Decreased mobility       PT Treatment Interventions Therapeutic exercise;Gait training;Balance training;Functional mobility training;Therapeutic activities    PT Goals (Current goals can be found in the Care Plan section)  Acute Rehab PT Goals Patient Stated Goal: return home, be able to walk farther PT Goal Formulation: With patient Time For Goal Achievement: 07/28/22 Potential to Achieve Goals: Good    Frequency Min 1X/week     Co-evaluation               AM-PAC PT "6 Clicks" Mobility  Outcome Measure Help needed turning from your back to your side while in a flat bed without using bedrails?: A Little Help needed moving from lying on your back to sitting on the side of a flat bed without using bedrails?: A Little Help needed moving to and from a bed to a chair (including a wheelchair)?: A Little Help needed  standing up from a chair using your arms (e.g., wheelchair or bedside chair)?: A Little Help needed to walk in hospital room?: A Little Help needed climbing 3-5 steps with a railing? : A Little 6 Click Score: 18    End of Session Equipment Utilized During Treatment: Gait belt Activity Tolerance: Patient limited by fatigue;Patient tolerated treatment well Patient left: in chair;with chair alarm set;with call bell/phone within reach Nurse Communication: Mobility status PT Visit Diagnosis: Difficulty in walking, not elsewhere classified (R26.2)    Time: 1610-9604 PT Time Calculation (min) (ACUTE ONLY): 20 min   Charges:   PT Evaluation $PT Eval Moderate Complexity: 1 Mod          Tamala Ser PT 07/14/2022  Acute Rehabilitation Services  Office [phone]

## 2022-07-14 NOTE — Plan of Care (Signed)

## 2022-07-14 NOTE — TOC Initial Note (Addendum)
Transition of Care [facility]) - Initial/Assessment Note    Patient Details  Name: Christine Jacobson MRN: [id number] Date of Birth: Dec 22, 1955  Transition of Care [facility]) CM/SW Contact:    Howell Rucks, RN Phone Number: 07/14/2022, 9:05 AM  Clinical Narrative:  [facility] consult for HH/DME needs, PT eval pending, await recommendation.  -2:40 Met with pt at bedside to introduce role of TOC/NCM and review for dc planning, PT recommendation for [facility] PT, pt agreeable, pt reports she is currently receiving [facility] RN services, unsure of [facility] agency. Per pt's previous admission, HH arranged with [facility], NCM outreached to [place], rep-Cory to confirm. Pt reports she has a PCP and pharmacy in place, she feels safe returning home and has transportation available at discharge. Request sent to attending MD to enter [facility] PT order.  TOC will continue to follow.      -3:28pm HH order for PT/RN/HHA entered by attending. [facility] rep-Cory confirmed acceptance.                    Patient Goals and CMS Choice            Expected Discharge Plan and Services                                              Prior Living Arrangements/Services                       Activities of Daily Living Home Assistive Devices/Equipment: Dan Humphreys (specify type) (front wheel walker) ADL Screening (condition at time of admission) Patient's cognitive ability adequate to safely complete daily activities?: Yes Is the patient deaf or have difficulty hearing?: No Does the patient have difficulty seeing, even when wearing glasses/contacts?: No Does the patient have difficulty concentrating, remembering, or making decisions?: No Patient able to express need for assistance with ADLs?: Yes Does the patient have difficulty dressing or bathing?: No (SHE STATES "i BATHE AND DRESS MYSELF AT HOME") Independently performs ADLs?: Yes (appropriate for developmental age) Does the patient have difficulty walking or climbing stairs?:  Yes Weakness of Legs: Both Weakness of Arms/Hands: None  Permission Sought/Granted                  Emotional Assessment              Admission diagnosis:  Cellulitis of both lower extremities [L03.115, L03.116] Patient Active Problem List   Diagnosis Date Noted   Cellulitis of both lower extremities 07/13/2022   Hypocarbia 07/13/2022   Normocytic anemia 07/13/2022   Cellulitis 06/02/2022   Sepsis due to cellulitis (HCC) 06/02/2022   ARF (acute renal failure) (HCC) 06/02/2022   Hyponatremia 06/02/2022   Dehydration 06/02/2022   Metabolic acidosis 06/02/2022   Other secondary pulmonary hypertension (HCC)    Cellulitis and abscess of left lower extremity 10/13/2021   Persistent atrial fibrillation (HCC) 07/28/2021   Obesity    Liver function test abnormality    Primary hypertension    Chronic diastolic heart failure (HCC) 12/15/2016   HOCM (hypertrophic obstructive cardiomyopathy) (HCC) 01/22/2016   Lower extremity edema 09/17/2014   History of syncope 12/05/2013   Syncope 12/05/2013   Pneumonia 11/23/2012   Shortness of breath 07/11/2011   Hypertension 11/23/2010   Chest pain 10/18/2010   Murmur 10/18/2010   Palpitations 09/23/2010   Mild hyperlipidemia    Elevated BP  Hyperprolactinemia (HCC)    Pyoderma gangrenosa    PCP:  Karenann Cai, NP Pharmacy:   [facility] [id number] - [address], [place] - [address] [place] [address] Phone: [phone] Fax: [phone]     Social Determinants of Health (SDOH) Social History: SDOH Screenings   Food Insecurity: No Food Insecurity (07/13/2022)  Recent Concern: Food Insecurity - Food Insecurity Present (06/02/2022)  Housing: Low Risk  (07/13/2022)  Transportation Needs: No Transportation Needs (07/13/2022)  Utilities: Not At Risk (07/13/2022)  Depression (PHQ2-9): Low Risk  (10/26/2021)  Financial Resource Strain: Low Risk  (12/03/2021)  Tobacco Use: Medium Risk  (07/13/2022)   SDOH Interventions:     Readmission Risk Interventions     No data to display

## 2022-07-14 NOTE — Progress Notes (Signed)
Until wound care nurses are able to see the patient today, I have cleansed BLE with NS and placed vaseline gauze, abdominal pads and kerlix.

## 2022-07-14 NOTE — Progress Notes (Signed)
PT Cancellation Note  Patient Details Name: Christine Jacobson MRN: [id number] DOB: Mar 04, 1955   Cancelled Treatment:    Reason Eval/Treat Not Completed: Fatigue/lethargy limiting ability to participate (pt sleeping, she politely requested PT attempt later today. Will follow.)   Tamala Ser PT 07/14/2022  Acute Rehabilitation Services  Office [phone]

## 2022-07-15 ENCOUNTER — Inpatient Hospital Stay (HOSPITAL_COMMUNITY): Payer: Medicare HMO

## 2022-07-15 DIAGNOSIS — L03116 Cellulitis of left lower limb: Secondary | ICD-10-CM | POA: Diagnosis not present

## 2022-07-15 DIAGNOSIS — L03115 Cellulitis of right lower limb: Secondary | ICD-10-CM | POA: Diagnosis not present

## 2022-07-15 LAB — BASIC METABOLIC PANEL
Anion gap: 7 (ref 5–15)
BUN: 28 mg/dL — ABNORMAL HIGH (ref 8–23)
CO2: 18 mmol/L — ABNORMAL LOW (ref 22–32)
Calcium: 9.2 mg/dL (ref 8.9–10.3)
Chloride: 113 mmol/L — ABNORMAL HIGH (ref 98–111)
Creatinine, Ser: 1.23 mg/dL — ABNORMAL HIGH (ref 0.44–1.00)
GFR, Estimated: 48 mL/min — ABNORMAL LOW (ref >60)
Glucose, Bld: 128 mg/dL — ABNORMAL HIGH (ref 70–99)
Potassium: 3.4 mmol/L — ABNORMAL LOW (ref 3.5–5.1)
Sodium: 138 mmol/L (ref 135–145)

## 2022-07-15 LAB — PHOSPHORUS: Phosphorus: 3.1 mg/dL (ref 2.5–4.6)

## 2022-07-15 LAB — CBC
HCT: 24.8 % — ABNORMAL LOW (ref 36.0–46.0)
Hemoglobin: 7.7 g/dL — ABNORMAL LOW (ref 12.0–15.0)
MCH: 31.4 pg (ref 26.0–34.0)
MCHC: 31 g/dL (ref 30.0–36.0)
MCV: 101.2 fL — ABNORMAL HIGH (ref 80.0–100.0)
Platelets: 244 10*3/uL (ref 150–400)
RBC: 2.45 MIL/uL — ABNORMAL LOW (ref 3.87–5.11)
RDW: 17.2 % — ABNORMAL HIGH (ref 11.5–15.5)
WBC: 10.2 10*3/uL (ref 4.0–10.5)
nRBC: 1.3 % — ABNORMAL HIGH (ref 0.0–0.2)

## 2022-07-15 LAB — CULTURE, BLOOD (ROUTINE X 2)

## 2022-07-15 LAB — MAGNESIUM: Magnesium: 2.2 mg/dL (ref 1.7–2.4)

## 2022-07-15 MED ORDER — SODIUM CHLORIDE (PF) 0.9 % IJ SOLN
INTRAMUSCULAR | Status: AC
  Filled 2022-07-15: qty 50

## 2022-07-15 MED ORDER — IOHEXOL 300 MG/ML  SOLN
100.0000 mL | Freq: Once | INTRAMUSCULAR | Status: AC | PRN
  Administered 2022-07-15: 100 mL via INTRAVENOUS

## 2022-07-15 MED ORDER — POTASSIUM CHLORIDE CRYS ER 20 MEQ PO TBCR
40.0000 meq | EXTENDED_RELEASE_TABLET | Freq: Once | ORAL | Status: AC
  Administered 2022-07-15: 40 meq via ORAL
  Filled 2022-07-15: qty 2

## 2022-07-15 NOTE — Progress Notes (Signed)
Physical Therapy Treatment Patient Details Name: Christine Jacobson MRN: [id number] DOB: [birth year] Today's Date: 07/15/2022   History of Present Illness 67 y.o. female with medical history significant of chest pain, hypertension, hyperprolactinemia, mild hyperlipidemia, obesity, palpitations, pyoderma gangrenosum, class II obesity, history of sepsis due to cellulitis who presented to the emergency department complaints of bilateral lower extremity cellulitis that is not improving on recent oral antibiotic course. Pt had a recent admission 5/16-5/23/24 for AKI.    PT Comments    Pt reported her legs are hurting after having a dressing change today, she declined mobility 2* pain. She agreed to seated BUE/LE exercises at edge of bed.    Recommendations for follow up therapy are one component of a multi-disciplinary discharge planning process, led by the attending physician.  Recommendations may be updated based on patient status, additional functional criteria and insurance authorization.  Follow Up Recommendations       Assistance Recommended at Discharge Intermittent Supervision/Assistance  Patient can return home with the following A little help with bathing/dressing/bathroom;Assistance with cooking/housework;Assist for transportation;Help with stairs or ramp for entrance   Equipment Recommendations  None recommended by PT    Recommendations for Other Services       Precautions / Restrictions Precautions Precautions: Fall Precaution Comments: pt denies falls in past 6 months Restrictions Weight Bearing Restrictions: No     Mobility  Bed Mobility Overal bed mobility: Modified Independent             General bed mobility comments: HOB up; sit to supine    Transfers                   General transfer comment: pt declined mobility 2* BLE pain    Ambulation/Gait                   Stairs             Wheelchair Mobility    Modified Rankin (Stroke  Patients Only)       Balance                                            Cognition Arousal/Alertness: Awake/alert Behavior During Therapy: WFL for tasks assessed/performed Overall Cognitive Status: Within Functional Limits for tasks assessed                                          Exercises General Exercises - Lower Extremity Ankle Circles/Pumps: AROM, Both, 20 reps, Supine Long Arc Quad: AROM, Both, 10 reps, Seated Hip Flexion/Marching: AROM, Both, 10 reps, Seated Shoulder Exercises Shoulder Flexion: AROM, Both, Seated, 15 reps    General Comments        Pertinent Vitals/Pain Pain Assessment Pain Score: 6  Pain Location: B feet Pain Descriptors / Indicators: Sore Pain Intervention(s): Limited activity within patient's tolerance, Monitored during session, Premedicated before session    Home Living                          Prior Function            PT Goals (current goals can now be found in the care plan section) Acute Rehab PT Goals Patient Stated Goal: return home, be able to walk farther  PT Goal Formulation: With patient Time For Goal Achievement: 07/28/22 Potential to Achieve Goals: Good Progress towards PT goals: Progressing toward goals    Frequency    Min 1X/week      PT Plan Current plan remains appropriate    Co-evaluation              AM-PAC PT "6 Clicks" Mobility   Outcome Measure  Help needed turning from your back to your side while in a flat bed without using bedrails?: A Little Help needed moving from lying on your back to sitting on the side of a flat bed without using bedrails?: A Little Help needed moving to and from a bed to a chair (including a wheelchair)?: A Little Help needed standing up from a chair using your arms (e.g., wheelchair or bedside chair)?: A Little Help needed to walk in hospital room?: A Little Help needed climbing 3-5 steps with a railing? : A Lot 6 Click  Score: 17    End of Session Equipment Utilized During Treatment: Gait belt Activity Tolerance: Patient limited by fatigue;Patient tolerated treatment well Patient left: in bed;with call bell/phone within reach;with bed alarm set Nurse Communication: Mobility status PT Visit Diagnosis: Difficulty in walking, not elsewhere classified (R26.2)     Time: 8657-8469 PT Time Calculation (min) (ACUTE ONLY): 11 min  Charges:  $Therapeutic Exercise: 8-22 mins                     Ralene Bathe Kistler PT 07/15/2022  Acute Rehabilitation Services  Office [phone]

## 2022-07-15 NOTE — Plan of Care (Signed)

## 2022-07-15 NOTE — Progress Notes (Signed)
PROGRESS NOTE    Christine Jacobson  ZOX:[id number] DOB: 11-24-1955 DOA: 07/13/2022 PCP: Karenann Cai, NP  Chief Complaint  Patient presents with   BLE cellulitis    Brief Narrative:   [name] is [name] 67 y.o. female with medical history significant of chest pain, hypertension, hyperprolactinemia, mild hyperlipidemia, obesity, palpitations, pyoderma gangrenosum, class II obesity, history of sepsis due to cellulitis who presented to the emergency department complaints of bilateral lower extremity cellulitis that is not improving on recent oral antibiotic course (cephalexin).  She stated she had [name] fever associated with chills and malaise on Monday.  No further fevers since.  She is [name] 9 rhinorrhea, sore throat, wheezing or hemoptysis.  No chest pain, palpitations, diaphoresis, PND, orthopnea or pitting edema of the lower extremities.  No abdominal pain, nausea, emesis, diarrhea, constipation, melena or hematochezia.  No flank pain, dysuria, frequency or hematuria.  No polyuria, polydipsia, polyphagia or blurred vision.    Lab work: CBC 0 white count 10.7, hemoglobin 8.9 g/dL and platelets 811.  CMP with Waymon Laser chloride of 115, CO2 of 14 mmol/L with Antion Andres normal anion gap.  The rest of the electrolytes were normal.  Glucose 97, BUN 36 and creatinine 1.29 mg/dL.  Albumin was 3.2 g/dL and alkaline phosphatase of 131 units/L.  The rest of the LFTs were normal.    ED course: Initial vital signs were temperature 97.7 F, pulse 72, respiration 18, BP 96/69 mmHg and O2 sat 100% on room air.  The patient received hydromorphone 0.5 mg IVP, NS 1000 mL bolus and ceftriaxone 2 g IVPB.    Assessment & Plan:   Principal Problem:   Cellulitis of both lower extremities Active Problems:   Mild hyperlipidemia   Hypertension   Chronic diastolic heart failure (HCC)   Obesity   Persistent atrial fibrillation (HCC)   Hypocarbia   Normocytic anemia  Bilateral LE Cellulitis Will monitor on ancef Mild leukocytosis,  trend Drainage noted today, will follow CT of bilateral LE's Follow blood cultures  Bilateral LE Edema  Will resume home diuretic Follow BNP (elevated, lower than previous) Prior echo with severe concentric hypertrophy of the left ventricle, hypertrophic cardiomyopathy, normal EF  Hypertrophic Cardiomyopathy  Chronic Diastolic Heart Failure  Moderate Pulmonary Hypertension  Last saw Dr. Servando Salina, was supposed to be on torsemide 20 mg BID Will order this for her here  Non Anion Gap Metabolic Acidosis  CKD IIIa Will continue to monitor, some mild CKD May need bicarb, will follow   Atrial Fibrillation Metoprolol, eliquis   Hypotension On midodrine, follow blood pressure  Anemia Downtrending, will continue to monitor  Obesity Body mass index is 36.56 kg/m.  Dyslipidemia Follow     DVT prophylaxis: eliquis Code Status: DNR Family Communication: none Disposition:   Status is: Inpatient Remains inpatient appropriate because: need for IV abx   Consultants:  none  Procedures:  none  Antimicrobials:  Anti-infectives (From admission, onward)    Start     Dose/Rate Route Frequency Ordered Stop   07/14/22 1400  cefTRIAXone (ROCEPHIN) 2 g in sodium chloride 0.9 % 100 mL IVPB  Status:  Discontinued        2 g 200 mL/hr over 30 Minutes Intravenous Every 24 hours 07/13/22 1534 07/14/22 0755   07/14/22 0900  ceFAZolin (ANCEF) IVPB 1 g/50 mL premix        1 g 100 mL/hr over 30 Minutes Intravenous Every 8 hours 07/14/22 0755 07/21/22 0859   07/13/22 1400  cefTRIAXone (ROCEPHIN) 2  g in sodium chloride 0.9 % 100 mL IVPB        2 g 200 mL/hr over 30 Minutes Intravenous  Once 07/13/22 1348 07/13/22 1446       Subjective: Continued leg pain  Objective: Vitals:   07/14/22 2100 07/15/22 0550 07/15/22 1105 07/15/22 1541  BP: 128/61 121/66 (!) 90/55 117/74  Pulse: 68 81 65 83  Resp:  19  (!) 22  Temp: 97.7 F (36.5 C) 98.1 F (36.7 C) 98.4 F (36.9 C) 98.3 F (36.8 C)   TempSrc: Oral Oral Oral Oral  SpO2: 97% 95% 97% 95%  Weight:      Height:        Intake/Output Summary (Last 24 hours) at 07/15/2022 1610 Last data filed at 07/15/2022 0500 Gross per 24 hour  Intake 290 ml  Output 751 ml  Net -461 ml   Filed Weights   07/13/22 1059 07/13/22 1605  Weight: 96.6 kg 96.6 kg    Examination:  General: No acute distress. Cardiovascular: Heart sounds show Rudolf Blizard regular rate, and rhythm. No gallops or rubs. No murmurs. No JVD. Lungs: unlabored Abdomen: Soft, nontender, nondistended  Neurological: Alert and oriented 3. Moves all extremities 4 with equal strength. Cranial nerves II through XII grossly intact. Extremities: erythema to bilateral lower extremities, wounds to back of legs with drainage, R>L    Data Reviewed: I have personally reviewed following labs and imaging studies  CBC: Recent Labs  Lab 07/13/22 1223 07/14/22 0423 07/15/22 0752  WBC 10.7* 11.3* 10.2  NEUTROABS 8.0*  --   --   HGB 8.9* 7.9* 7.7*  HCT 28.3* 25.4* 24.8*  MCV 99.3 101.6* 101.2*  PLT 256 248 244    Basic Metabolic Panel: Recent Labs  Lab 07/13/22 1223 07/14/22 0423 07/15/22 0752  NA 137 138 138  K 3.8 3.8 3.4*  CL 115* 116* 113*  CO2 14* 16* 18*  GLUCOSE 97 120* 128*  BUN 36* 32* 28*  CREATININE 1.29* 1.31* 1.23*  CALCIUM 9.8 9.4 9.2  MG  --   --  2.2  PHOS  --   --  3.1    GFR: Estimated Creatinine Clearance: 48.7 mL/min (Monteen Toops) (by C-G formula based on SCr of 1.23 mg/dL (H)).  Liver Function Tests: Recent Labs  Lab 07/13/22 1223 07/14/22 0423  AST 33 27  ALT 39 33  ALKPHOS 231* 197*  BILITOT 0.6 0.5  PROT 6.6 5.7*  ALBUMIN 3.2* 2.7*    CBG: No results for input(s): "GLUCAP" in the last 168 hours.   Recent Results (from the past 240 hour(s))  Blood culture (routine x 2)     Status: None (Preliminary result)   Collection Time: 07/13/22 12:15 PM   Specimen: BLOOD  Result Value Ref Range Status   Specimen Description   Final    BLOOD  BLOOD LEFT FOREARM Performed at [facility], [address] [place], [place] [address]    Special Requests   Final    BOTTLES DRAWN AEROBIC AND ANAEROBIC Blood Culture adequate volume Performed at [facility], [address] [place], [place] [address]    Culture   Final    NO GROWTH 2 DAYS Performed at [facility], [address] [place], [place] [address]    Report Status PENDING  Incomplete  Blood culture (routine x 2)     Status: None (Preliminary result)   Collection Time: 07/13/22 12:23 PM   Specimen: BLOOD  Result Value Ref  Range Status   Specimen Description   Final    BLOOD BLOOD RIGHT ARM Performed at [facility], [address] [place], [place] [address]    Special Requests   Final    BOTTLES DRAWN AEROBIC AND ANAEROBIC Blood Culture adequate volume Performed at [facility], [address], [place], [place] [address]    Culture   Final    NO GROWTH 2 DAYS Performed at [facility], [address], [place], [place] [address]    Report Status PENDING  Incomplete         Radiology Studies: No results found.      Scheduled Meds:  apixaban  5 mg Oral BID   ascorbic acid  500 mg Oral Daily   metoprolol succinate  100 mg Oral BID   midodrine  5 mg Oral BID WC   potassium chloride SA  20 mEq Oral Daily   torsemide  20 mg Oral BID   Continuous Infusions:   ceFAZolin (ANCEF) IV 1 g (07/15/22 1019)     LOS: 2 days    Time spent: over 30 min    Lacretia Nicks, MD [facility]   To contact the attending provider between 7A-7P or the covering provider during after hours 7P-7A, please log into the web site [email or web address] and access using universal [place] password for that web site. If you do not have the password, please call the hospital operator.  07/15/2022, 4:10 PM

## 2022-07-16 ENCOUNTER — Inpatient Hospital Stay (HOSPITAL_COMMUNITY): Payer: Medicare HMO

## 2022-07-16 DIAGNOSIS — L03116 Cellulitis of left lower limb: Secondary | ICD-10-CM | POA: Diagnosis not present

## 2022-07-16 DIAGNOSIS — L03115 Cellulitis of right lower limb: Secondary | ICD-10-CM | POA: Diagnosis not present

## 2022-07-16 LAB — CBC
HCT: 23.9 % — ABNORMAL LOW (ref 36.0–46.0)
Hemoglobin: 7.4 g/dL — ABNORMAL LOW (ref 12.0–15.0)
MCH: 31.9 pg (ref 26.0–34.0)
MCHC: 31 g/dL (ref 30.0–36.0)
MCV: 103 fL — ABNORMAL HIGH (ref 80.0–100.0)
Platelets: 262 10*3/uL (ref 150–400)
RBC: 2.32 MIL/uL — ABNORMAL LOW (ref 3.87–5.11)
RDW: 17.7 % — ABNORMAL HIGH (ref 11.5–15.5)
WBC: 10.5 10*3/uL (ref 4.0–10.5)
nRBC: 1 % — ABNORMAL HIGH (ref 0.0–0.2)

## 2022-07-16 LAB — BASIC METABOLIC PANEL
Anion gap: 7 (ref 5–15)
BUN: 26 mg/dL — ABNORMAL HIGH (ref 8–23)
CO2: 20 mmol/L — ABNORMAL LOW (ref 22–32)
Calcium: 9.1 mg/dL (ref 8.9–10.3)
Chloride: 109 mmol/L (ref 98–111)
Creatinine, Ser: 1.18 mg/dL — ABNORMAL HIGH (ref 0.44–1.00)
GFR, Estimated: 50 mL/min — ABNORMAL LOW (ref >60)
Glucose, Bld: 96 mg/dL (ref 70–99)
Potassium: 3 mmol/L — ABNORMAL LOW (ref 3.5–5.1)
Sodium: 136 mmol/L (ref 135–145)

## 2022-07-16 LAB — BRAIN NATRIURETIC PEPTIDE: B Natriuretic Peptide: 1055.7 pg/mL — ABNORMAL HIGH (ref 0.0–100.0)

## 2022-07-16 MED ORDER — FUROSEMIDE 10 MG/ML IJ SOLN
40.0000 mg | Freq: Two times a day (BID) | INTRAMUSCULAR | Status: DC
  Administered 2022-07-16 – 2022-07-21 (×10): 40 mg via INTRAVENOUS
  Filled 2022-07-16 (×10): qty 4

## 2022-07-16 MED ORDER — POTASSIUM CHLORIDE CRYS ER 20 MEQ PO TBCR
40.0000 meq | EXTENDED_RELEASE_TABLET | ORAL | Status: AC
  Administered 2022-07-16 (×2): 40 meq via ORAL
  Filled 2022-07-16 (×2): qty 2

## 2022-07-16 NOTE — Progress Notes (Signed)
PROGRESS NOTE    Christine Jacobson  WUJ:[id number] DOB: [birth year] DOA: 07/13/2022 PCP: Karenann Cai, NP  Chief Complaint  Patient presents with   BLE cellulitis    Brief Narrative:   Christine Jacobson is Christine Jacobson 67 y.o. female with medical history significant of chest pain, hypertension, hyperprolactinemia, mild hyperlipidemia, obesity, palpitations, pyoderma gangrenosum, class II obesity, history of sepsis due to cellulitis who presented to the emergency department complaints of bilateral lower extremity cellulitis that is not improving on recent oral antibiotic course (cephalexin).  She stated she had Samule Life fever associated with chills and malaise on Monday.  No further fevers since.  She is Christine Jacobson 7 rhinorrhea, sore throat, wheezing or hemoptysis.  No chest pain, palpitations, diaphoresis, PND, orthopnea or pitting edema of the lower extremities.  No abdominal pain, nausea, emesis, diarrhea, constipation, melena or hematochezia.  No flank pain, dysuria, frequency or hematuria.  No polyuria, polydipsia, polyphagia or blurred vision.    Lab work: CBC 0 white count 10.7, hemoglobin 8.9 g/dL and platelets 956.  CMP with Pearlie Lafosse chloride of 115, CO2 of 14 mmol/L with Bradie Sangiovanni normal anion gap.  The rest of the electrolytes were normal.  Glucose 97, BUN 36 and creatinine 1.29 mg/dL.  Albumin was 3.2 g/dL and alkaline phosphatase of 131 units/L.  The rest of the LFTs were normal.    ED course: Initial vital signs were temperature 97.7 F, pulse 72, respiration 18, BP 96/69 mmHg and O2 sat 100% on room air.  The patient received hydromorphone 0.5 mg IVP, NS 1000 mL bolus and ceftriaxone 2 g IVPB.    Assessment & Plan:   Principal Problem:   Cellulitis of both lower extremities Active Problems:   Mild hyperlipidemia   Hypertension   Chronic diastolic heart failure (HCC)   Obesity   Persistent atrial fibrillation (HCC)   Hypocarbia   Normocytic anemia  Tachypnea  Shortness of Breath Noted by nursing and on  exam CXR Will transition to IV lasix  Bilateral LE Cellulitis Will monitor on ancef Mild leukocytosis, trend CT without fluid collection or abscess Follow blood cultures (Ngx3 days)  Bilateral LE Edema  Will resume home diuretic -> IV diuresis as above Follow BNP (elevated, lower than previous) Prior echo with severe concentric hypertrophy of the left ventricle, hypertrophic cardiomyopathy, normal EF  Hypertrophic Cardiomyopathy  Chronic Diastolic Heart Failure  Moderate Pulmonary Hypertension  Last saw Dr. Servando Salina, was supposed to be on torsemide 20 mg BID Will order this for her here -> IV diuresis   Hypokalemia Replace, follow   Non Anion Gap Metabolic Acidosis  CKD IIIa Will continue to monitor, some mild CKD May need bicarb, will follow   Atrial Fibrillation Metoprolol, eliquis   Hypotension On midodrine, follow blood pressure  Anemia Downtrending, will continue to monitor Iron, b12, folate, ferritin  Obesity Body mass index is 36.56 kg/m.  Dyslipidemia Follow     DVT prophylaxis: eliquis Code Status: DNR Family Communication: none Disposition:   Status is: Inpatient Remains inpatient appropriate because: need for IV abx   Consultants:  none  Procedures:  none  Antimicrobials:  Anti-infectives (From admission, onward)    Start     Dose/Rate Route Frequency Ordered Stop   07/14/22 1400  cefTRIAXone (ROCEPHIN) 2 g in sodium chloride 0.9 % 100 mL IVPB  Status:  Discontinued        2 g 200 mL/hr over 30 Minutes Intravenous Every 24 hours 07/13/22 1534 07/14/22 0755   07/14/22 0900  ceFAZolin (ANCEF)  IVPB 1 g/50 mL premix        1 g 100 mL/hr over 30 Minutes Intravenous Every 8 hours 07/14/22 0755 07/21/22 0859   07/13/22 1400  cefTRIAXone (ROCEPHIN) 2 g in sodium chloride 0.9 % 100 mL IVPB        2 g 200 mL/hr over 30 Minutes Intravenous  Once 07/13/22 1348 07/13/22 1446       Subjective: No complaints She tends to downplay her  symptoms  Objective: Vitals:   07/14/22 2100 07/15/22 0550 07/15/22 1105 07/15/22 1541  BP: 128/61 121/66 (!) 90/55 117/74  Pulse: 68 81 65 83  Resp:  19  (!) 22  Temp: 97.7 F (36.5 C) 98.1 F (36.7 C) 98.4 F (36.9 C) 98.3 F (36.8 C)  TempSrc: Oral Oral Oral Oral  SpO2: 97% 95% 97% 95%  Weight:      Height:        Intake/Output Summary (Last 24 hours) at 07/15/2022 1610 Last data filed at 07/15/2022 0500 Gross per 24 hour  Intake 290 ml  Output 751 ml  Net -461 ml   Filed Weights   07/13/22 1059 07/13/22 1605  Weight: 96.6 kg 96.6 kg    Examination:  General: No acute distress. Cardiovascular: RRR Lungs: tachypneic, diminished Abdomen: Soft, nontender, nondistended  Neurological: Alert and oriented 3. Moves all extremities 4 with equal strength. Cranial nerves II through XII grossly intact. Extremities: bilateral LE edema, dressings in place     Data Reviewed: I have personally reviewed following labs and imaging studies  CBC: Recent Labs  Lab 07/13/22 1223 07/14/22 0423 07/15/22 0752  WBC 10.7* 11.3* 10.2  NEUTROABS 8.0*  --   --   HGB 8.9* 7.9* 7.7*  HCT 28.3* 25.4* 24.8*  MCV 99.3 101.6* 101.2*  PLT 256 248 244    Basic Metabolic Panel: Recent Labs  Lab 07/13/22 1223 07/14/22 0423 07/15/22 0752  NA 137 138 138  K 3.8 3.8 3.4*  CL 115* 116* 113*  CO2 14* 16* 18*  GLUCOSE 97 120* 128*  BUN 36* 32* 28*  CREATININE 1.29* 1.31* 1.23*  CALCIUM 9.8 9.4 9.2  MG  --   --  2.2  PHOS  --   --  3.1    GFR: Estimated Creatinine Clearance: 48.7 mL/min (Christine Jacobson) (by C-G formula based on SCr of 1.23 mg/dL (H)).  Liver Function Tests: Recent Labs  Lab 07/13/22 1223 07/14/22 0423  AST 33 27  ALT 39 33  ALKPHOS 231* 197*  BILITOT 0.6 0.5  PROT 6.6 5.7*  ALBUMIN 3.2* 2.7*    CBG: No results for input(s): "GLUCAP" in the last 168 hours.   Recent Results (from the past 240 hour(s))  Blood culture (routine x 2)     Status: None (Preliminary  result)   Collection Time: 07/13/22 12:15 PM   Specimen: BLOOD  Result Value Ref Range Status   Specimen Description   Final    BLOOD BLOOD LEFT FOREARM Performed at [facility], [address], [place], [place] [address]    Special Requests   Final    BOTTLES DRAWN AEROBIC AND ANAEROBIC Blood Culture adequate volume Performed at [facility], [address], [place], [place] [address]    Culture   Final    NO GROWTH 2 DAYS Performed at [facility], [address], [place], [place] [address]    Report Status PENDING  Incomplete  Blood culture (routine x 2)  Status: None (Preliminary result)   Collection Time: 07/13/22 12:23 PM   Specimen: BLOOD  Result Value Ref Range Status   Specimen Description   Final    BLOOD BLOOD RIGHT ARM Performed at [facility], [address], [place], [place] [address]    Special Requests   Final    BOTTLES DRAWN AEROBIC AND ANAEROBIC Blood Culture adequate volume Performed at [facility], [address], [place], [place] [address]    Culture   Final    NO GROWTH 2 DAYS Performed at [facility], [address] [place], [place] [address]    Report Status PENDING  Incomplete         Radiology Studies: No results found.      Scheduled Meds:  apixaban  5 mg Oral BID   ascorbic acid  500 mg Oral Daily   metoprolol succinate  100 mg Oral BID   midodrine  5 mg Oral BID WC   potassium chloride SA  20 mEq Oral Daily   torsemide  20 mg Oral BID   Continuous Infusions:   ceFAZolin (ANCEF) IV 1 g (07/15/22 1019)     LOS: 2 days    Time spent: over 30 min    Lacretia Nicks, MD [facility]   To contact the attending provider between 7A-7P or the covering provider during after hours 7P-7A, please log into the web site [email or web address] and access using universal  password for that web site. If you do not have the password,  please call the hospital operator.  07/15/2022, 4:10 PM

## 2022-07-17 DIAGNOSIS — L03115 Cellulitis of right lower limb: Secondary | ICD-10-CM | POA: Diagnosis not present

## 2022-07-17 DIAGNOSIS — L03116 Cellulitis of left lower limb: Secondary | ICD-10-CM | POA: Diagnosis not present

## 2022-07-17 LAB — IRON AND TIBC
Iron: 50 ug/dL (ref 28–170)
Saturation Ratios: 14 % (ref 10.4–31.8)
TIBC: 347 ug/dL (ref 250–450)
UIBC: 297 ug/dL

## 2022-07-17 LAB — CBC WITH DIFFERENTIAL/PLATELET
Abs Immature Granulocytes: 0.22 10*3/uL — ABNORMAL HIGH (ref 0.00–0.07)
Basophils Absolute: 0.2 10*3/uL — ABNORMAL HIGH (ref 0.0–0.1)
Basophils Relative: 2 %
Eosinophils Absolute: 0.7 10*3/uL — ABNORMAL HIGH (ref 0.0–0.5)
Eosinophils Relative: 7 %
HCT: 24.8 % — ABNORMAL LOW (ref 36.0–46.0)
Hemoglobin: 7.6 g/dL — ABNORMAL LOW (ref 12.0–15.0)
Immature Granulocytes: 2 %
Lymphocytes Relative: 15 %
Lymphs Abs: 1.5 10*3/uL (ref 0.7–4.0)
MCH: 31.4 pg (ref 26.0–34.0)
MCHC: 30.6 g/dL (ref 30.0–36.0)
MCV: 102.5 fL — ABNORMAL HIGH (ref 80.0–100.0)
Monocytes Absolute: 0.9 10*3/uL (ref 0.1–1.0)
Monocytes Relative: 9 %
Neutro Abs: 6.5 10*3/uL (ref 1.7–7.7)
Neutrophils Relative %: 65 %
Platelets: 273 10*3/uL (ref 150–400)
RBC: 2.42 MIL/uL — ABNORMAL LOW (ref 3.87–5.11)
RDW: 17.9 % — ABNORMAL HIGH (ref 11.5–15.5)
WBC: 9.9 10*3/uL (ref 4.0–10.5)
nRBC: 0.7 % — ABNORMAL HIGH (ref 0.0–0.2)

## 2022-07-17 LAB — CULTURE, BLOOD (ROUTINE X 2): Culture: NO GROWTH

## 2022-07-17 LAB — COMPREHENSIVE METABOLIC PANEL
ALT: 26 U/L (ref 0–44)
AST: 42 U/L — ABNORMAL HIGH (ref 15–41)
Albumin: 2.8 g/dL — ABNORMAL LOW (ref 3.5–5.0)
Alkaline Phosphatase: 237 U/L — ABNORMAL HIGH (ref 38–126)
Anion gap: 9 (ref 5–15)
BUN: 25 mg/dL — ABNORMAL HIGH (ref 8–23)
CO2: 23 mmol/L (ref 22–32)
Calcium: 9.6 mg/dL (ref 8.9–10.3)
Chloride: 109 mmol/L (ref 98–111)
Creatinine, Ser: 1.14 mg/dL — ABNORMAL HIGH (ref 0.44–1.00)
GFR, Estimated: 52 mL/min — ABNORMAL LOW (ref >60)
Glucose, Bld: 113 mg/dL — ABNORMAL HIGH (ref 70–99)
Potassium: 4.1 mmol/L (ref 3.5–5.1)
Sodium: 141 mmol/L (ref 135–145)
Total Bilirubin: 0.4 mg/dL (ref 0.3–1.2)
Total Protein: 6 g/dL — ABNORMAL LOW (ref 6.5–8.1)

## 2022-07-17 LAB — VITAMIN B12: Vitamin B-12: 355 pg/mL (ref 180–914)

## 2022-07-17 LAB — MAGNESIUM: Magnesium: 2.2 mg/dL (ref 1.7–2.4)

## 2022-07-17 LAB — FOLATE: Folate: 4.9 ng/mL — ABNORMAL LOW (ref >5.9)

## 2022-07-17 LAB — FERRITIN: Ferritin: 67 ng/mL (ref 11–307)

## 2022-07-17 LAB — PHOSPHORUS: Phosphorus: 3.2 mg/dL (ref 2.5–4.6)

## 2022-07-17 MED ORDER — FOLIC ACID 1 MG PO TABS
1.0000 mg | ORAL_TABLET | Freq: Every day | ORAL | Status: DC
  Administered 2022-07-17 – 2022-07-21 (×5): 1 mg via ORAL
  Filled 2022-07-17 (×5): qty 1

## 2022-07-17 NOTE — Plan of Care (Signed)
  Problem: Skin Integrity: Goal: Skin integrity will improve Outcome: Progressing   Problem: Clinical Measurements: Goal: Ability to avoid or minimize complications of infection will improve Outcome: Progressing   Problem: Skin Integrity: Goal: Skin integrity will improve Outcome: Progressing   Problem: Education: Goal: Knowledge of General Education information will improve Description: Including pain rating scale, medication(s)/side effects and non-pharmacologic comfort measures Outcome: Progressing

## 2022-07-17 NOTE — Progress Notes (Signed)
Physical Therapy Treatment Patient Details Name: Christine Jacobson MRN: [id number] DOB: 1956-01-03 Today's Date: 07/17/2022   History of Present Illness 67 y.o. female with medical history significant of chest pain, hypertension, hyperprolactinemia, mild hyperlipidemia, obesity, palpitations, pyoderma gangrenosum, class II obesity, history of sepsis due to cellulitis who presented to the emergency department complaints of bilateral lower extremity cellulitis that is not improving on recent oral antibiotic course. Pt had a recent admission 5/16-5/23/24 for AKI.    PT Comments    Pt requesting assistance to bathroom. She was able to walk to/from bathroom + a lap around the room. Dyspnea 3/4. O2 95% on RA, HR 105 bpm. Distance limited by fatigue, dyspnea. Will continue to follow and progress activity as tolerated.     Recommendations for follow up therapy are one component of a multi-disciplinary discharge planning process, led by the attending physician.  Recommendations may be updated based on patient status, additional functional criteria and insurance authorization.  Follow Up Recommendations       Assistance Recommended at Discharge Intermittent Supervision/Assistance  Patient can return home with the following A little help with bathing/dressing/bathroom;Assistance with cooking/housework;Assist for transportation;Help with stairs or ramp for entrance   Equipment Recommendations  None recommended by PT    Recommendations for Other Services       Precautions / Restrictions Precautions Precautions: Fall Restrictions Weight Bearing Restrictions: No     Mobility  Bed Mobility Overal bed mobility: Modified Independent                  Transfers Overall transfer level: Needs assistance Equipment used: Rolling walker (2 wheels) Transfers: Sit to/from Stand Sit to Stand: Supervision                Ambulation/Gait Ambulation/Gait assistance: Supervision Gait Distance  (Feet): 30 Feet (30'x1; 15'x1) Assistive device: Rolling walker (2 wheels) Gait Pattern/deviations: Step-through pattern, Decreased stride length       General Gait Details: Distance limited by dyspnea, fatigue. No LOB with RW use. Dyspnea 3/4. O2 95%. HR 105 bpm after ambulation   Stairs             Wheelchair Mobility    Modified Rankin (Stroke Patients Only)       Balance Overall balance assessment: Needs assistance         Standing balance support: Bilateral upper extremity supported, Reliant on assistive device for balance, During functional activity Standing balance-Leahy Scale: Fair                              Cognition Arousal/Alertness: Awake/alert Behavior During Therapy: WFL for tasks assessed/performed Overall Cognitive Status: Within Functional Limits for tasks assessed                                          Exercises      General Comments        Pertinent Vitals/Pain Pain Assessment Pain Assessment: Faces Faces Pain Scale: Hurts little more Pain Location: bil LEs/feet Pain Descriptors / Indicators: Discomfort, Sore Pain Intervention(s): Monitored during session    Home Living                          Prior Function            PT Goals (current goals can now  be found in the care plan section) Progress towards PT goals: Progressing toward goals    Frequency    Min 1X/week      PT Plan Current plan remains appropriate    Co-evaluation              AM-PAC PT "6 Clicks" Mobility   Outcome Measure  Help needed turning from your back to your side while in a flat bed without using bedrails?: None Help needed moving from lying on your back to sitting on the side of a flat bed without using bedrails?: None Help needed moving to and from a bed to a chair (including a wheelchair)?: None Help needed standing up from a chair using your arms (e.g., wheelchair or bedside chair)?:  None Help needed to walk in hospital room?: A Little Help needed climbing 3-5 steps with a railing? : A Lot 6 Click Score: 21    End of Session   Activity Tolerance: Patient limited by fatigue Patient left: in bed;with call bell/phone within reach;with bed alarm set   PT Visit Diagnosis: Difficulty in walking, not elsewhere classified (R26.2)     Time: 1610-9604 PT Time Calculation (min) (ACUTE ONLY): 13 min  Charges:  $Gait Training: 8-22 mins                         Faye Ramsay, PT Acute Rehabilitation  Office: [phone]

## 2022-07-17 NOTE — Progress Notes (Signed)
PROGRESS NOTE    Christine Jacobson  NUU:[id number] DOB: 03-20-1955 DOA: 07/13/2022 PCP: Karenann Cai, NP  Chief Complaint  Patient presents with   BLE cellulitis    Brief Narrative:   Christine Jacobson is Christine Jacobson 67 y.o. female with medical history significant of chest pain, hypertension, hyperprolactinemia, mild hyperlipidemia, obesity, palpitations, pyoderma gangrenosum, class II obesity, history of sepsis due to cellulitis who presented to the emergency department complaints of bilateral lower extremity cellulitis that is not improving on recent oral antibiotic course (cephalexin).  She stated she had Christine Jacobson fever associated with chills and malaise on Monday.  No further fevers since.  She is Christine Jacobson 9 rhinorrhea, sore throat, wheezing or hemoptysis.  No chest pain, palpitations, diaphoresis, PND, orthopnea or pitting edema of the lower extremities.  No abdominal pain, nausea, emesis, diarrhea, constipation, melena or hematochezia.  No flank pain, dysuria, frequency or hematuria.  No polyuria, polydipsia, polyphagia or blurred vision.    Lab work: CBC 0 white count 10.7, hemoglobin 8.9 g/dL and platelets 347.  CMP with Christine Jacobson chloride of 115, CO2 of 14 mmol/L with Christine Jacobson normal anion gap.  The rest of the electrolytes were normal.  Glucose 97, BUN 36 and creatinine 1.29 mg/dL.  Albumin was 3.2 g/dL and alkaline phosphatase of 131 units/L.  The rest of the LFTs were normal.    ED course: Initial vital signs were temperature 97.7 F, pulse 72, respiration 18, BP 96/69 mmHg and O2 sat 100% on room air.  The patient received hydromorphone 0.5 mg IVP, NS 1000 mL bolus and ceftriaxone 2 g IVPB.    Assessment & Plan:   Principal Problem:   Cellulitis of both lower extremities Active Problems:   Mild hyperlipidemia   Hypertension   Chronic diastolic heart failure (HCC)   Obesity   Persistent atrial fibrillation (HCC)   Hypocarbia   Normocytic anemia  Tachypnea  Shortness of Breath Noted by nursing and on exam ->  seems improved overall, she notes Christine Jacobson portion of anxiety CXR without active lung disease Will transition to IV lasix  Bilateral LE Cellulitis Will monitor on ancef Mild leukocytosis, trend CT without fluid collection or abscess Follow blood cultures (Ngx3 days)  Bilateral LE Edema  Will resume home diuretic -> IV diuresis as above Follow BNP (elevated, lower than previous) Prior echo with severe concentric hypertrophy of the left ventricle, hypertrophic cardiomyopathy, normal EF  Hypertrophic Cardiomyopathy  Chronic Diastolic Heart Failure  Moderate Pulmonary Hypertension  Last saw Christine Jacobson, was supposed to be on torsemide 20 mg BID Will order this for her here -> IV diuresis, legs seem better with this  Hypokalemia Replace, follow   Non Anion Gap Metabolic Acidosis  CKD IIIa Will continue to monitor, some mild CKD May need bicarb, will follow   Atrial Fibrillation Metoprolol, eliquis   Hypotension On midodrine, follow blood pressure  Anemia  Folate Deficiency Downtrending, will continue to monitor Folate supplementation  Obesity Body mass index is 35.23 kg/m.  Dyslipidemia Follow     DVT prophylaxis: eliquis Code Status: DNR Family Communication: none Disposition:   Status is: Inpatient Remains inpatient appropriate because: need for IV abx   Consultants:  none  Procedures:  none  Antimicrobials:  Anti-infectives (From admission, onward)    Start     Dose/Rate Route Frequency Ordered Stop   07/14/22 1400  cefTRIAXone (ROCEPHIN) 2 g in sodium chloride 0.9 % 100 mL IVPB  Status:  Discontinued        2 g 200  mL/hr over 30 Minutes Intravenous Every 24 hours 07/13/22 1534 07/14/22 0755   07/14/22 0900  ceFAZolin (ANCEF) IVPB 1 g/50 mL premix        1 g 100 mL/hr over 30 Minutes Intravenous Every 8 hours 07/14/22 0755 07/21/22 0859   07/13/22 1400  cefTRIAXone (ROCEPHIN) 2 g in sodium chloride 0.9 % 100 mL IVPB        2 g 200 mL/hr over 30 Minutes  Intravenous  Once 07/13/22 1348 07/13/22 1446       Subjective: No new complaints She tends to downplay her symptoms  Objective: Vitals:   07/17/22 0807 07/17/22 1010 07/17/22 1232 07/17/22 1448  BP: (!) 121/55 101/61 105/60 96/65  Pulse: 76 75 84 78  Resp:   (!) 21 16  Temp:  98.2 F (36.8 C) 98.9 F (37.2 C) 98.1 F (36.7 C)  TempSrc:   Oral Oral  SpO2:  95% 95% 97%  Weight:      Height:        Intake/Output Summary (Last 24 hours) at 07/17/2022 1537 Last data filed at 07/17/2022 1300 Gross per 24 hour  Intake 1110 ml  Output 725 ml  Net 385 ml   Filed Weights   07/13/22 1059 07/13/22 1605 07/17/22 0446  Weight: 96.6 kg 96.6 kg 93.1 kg    Examination:  General: No acute distress. Cardiovascular: RRR Lungs: unlabored Abdomen: Soft, nontender, nondistended  Neurological: Alert and oriented 3. Moves all extremities 4 with equal strength. Cranial nerves II through XII grossly intact. Extremities: bilateral LE edema seems to improved, improved redness, wounds are gradually improving    Data Reviewed: I have personally reviewed following labs and imaging studies  CBC: Recent Labs  Lab 07/13/22 1223 07/14/22 0423 07/15/22 0752 07/16/22 0806 07/17/22 0407  WBC 10.7* 11.3* 10.2 10.5 9.9  NEUTROABS 8.0*  --   --   --  6.5  HGB 8.9* 7.9* 7.7* 7.4* 7.6*  HCT 28.3* 25.4* 24.8* 23.9* 24.8*  MCV 99.3 101.6* 101.2* 103.0* 102.5*  PLT 256 248 244 262 273    Basic Metabolic Panel: Recent Labs  Lab 07/13/22 1223 07/14/22 0423 07/15/22 0752 07/16/22 0806 07/17/22 0407  NA 137 138 138 136 141  K 3.8 3.8 3.4* 3.0* 4.1  CL 115* 116* 113* 109 109  CO2 14* 16* 18* 20* 23  GLUCOSE 97 120* 128* 96 113*  BUN 36* 32* 28* 26* 25*  CREATININE 1.29* 1.31* 1.23* 1.18* 1.14*  CALCIUM 9.8 9.4 9.2 9.1 9.6  MG  --   --  2.2  --  2.2  PHOS  --   --  3.1  --  3.2    GFR: Estimated Creatinine Clearance: 51.5 mL/min ([name]) (by C-G formula based on SCr of 1.14 mg/dL  (H)).  Liver Function Tests: Recent Labs  Lab 07/13/22 1223 07/14/22 0423 07/17/22 0407  AST 33 27 42*  ALT 39 33 26  ALKPHOS 231* 197* 237*  BILITOT 0.6 0.5 0.4  PROT 6.6 5.7* 6.0*  ALBUMIN 3.2* 2.7* 2.8*    CBG: No results for input(s): "GLUCAP" in the last 168 hours.   Recent Results (from the past 240 hour(s))  Blood culture (routine x 2)     Status: None (Preliminary result)   Collection Time: 07/13/22 12:15 PM   Specimen: BLOOD  Result Value Ref Range Status   Specimen Description   Final    BLOOD BLOOD LEFT FOREARM Performed at [facility], [address] [place],  [place] [address]    Special Requests   Final    BOTTLES DRAWN AEROBIC AND ANAEROBIC Blood Culture adequate volume Performed at [facility], [address], [place], [place] [address]    Culture   Final    NO GROWTH 4 DAYS Performed at [facility], [address], [place], [place] [address]    Report Status PENDING  Incomplete  Blood culture (routine x 2)     Status: None (Preliminary result)   Collection Time: 07/13/22 12:23 PM   Specimen: BLOOD  Result Value Ref Range Status   Specimen Description   Final    BLOOD BLOOD RIGHT ARM Performed at [facility], [address] [place], [place] [address]    Special Requests   Final    BOTTLES DRAWN AEROBIC AND ANAEROBIC Blood Culture adequate volume Performed at [facility], [address] [place], [place] [address]    Culture   Final    NO GROWTH 4 DAYS Performed at [facility], [address] [place], [place] [address]    Report Status PENDING  Incomplete         Radiology Studies: DG CHEST PORT 1 VIEW  Result Date: 07/16/2022 CLINICAL DATA:  Shortness of breath. EXAM: PORTABLE CHEST 1 VIEW COMPARISON:  06/02/2022 FINDINGS: Stable cardiomegaly.  Both lungs are clear. IMPRESSION: Stable cardiomegaly.  No active lung disease. Electronically  Signed   By: Danae Orleans M.D.   On: 07/16/2022 14:10   CT TIBIA FIBULA RIGHT W CONTRAST  Result Date: 07/15/2022 CLINICAL DATA:  Soft tissue infection suspected. EXAM: CT OF THE LOWER RIGHT EXTREMITY WITH CONTRAST TECHNIQUE: Multidetector CT imaging of the lower right extremity was performed according to the standard protocol following intravenous contrast administration. RADIATION DOSE REDUCTION: This exam was performed according to the departmental dose-optimization program which includes automated exposure control, adjustment of the mA and/or kV according to patient size and/or use of iterative reconstruction technique. CONTRAST:  OMNIPAQUE IOHEXOL 300 MG/ML  SOLN COMPARISON:  None Available. FINDINGS: Bones/Joint/Cartilage No evidence of fracture or dislocation. No cortical erosion or periosteal reaction to suggest osteomyelitis. Mild degenerative changes of the patellofemoral compartment. No appreciable joint effusion. Ligaments Suboptimally assessed by CT. Muscles and Tendons Muscles are normal in bulk. No intramuscular hematoma or fluid collection. Tendons of the flexor, extensor and peroneal compartments are intact. Achilles tendon is intact. Soft tissues Skin thickening and subcutaneous soft tissue edema prominent about the lower leg suggesting severe cellulitis. No fluid collection or abscess on this noncontrast examination. IMPRESSION: 1. Skin thickening and subcutaneous soft tissue edema prominent about the lower leg suggesting cellulitis. No fluid collection or abscess on this noncontrast enhanced examination. 2. No evidence of fracture or dislocation. No CT evidence of osteomyelitis. Electronically Signed   By: Larose Hires D.O.   On: 07/15/2022 16:35   CT TIBIA FIBULA LEFT W CONTRAST  Result Date: 07/15/2022 CLINICAL DATA:  Soft tissue infection suspected. EXAM: CT OF THE LOWER LEFT EXTREMITY WITH CONTRAST TECHNIQUE: Multidetector CT imaging of the lower left extremity was performed  according to the standard protocol following intravenous contrast administration. RADIATION DOSE REDUCTION: This exam was performed according to the departmental dose-optimization program which includes automated exposure control, adjustment of the mA and/or kV according to patient size and/or use of iterative reconstruction technique. CONTRAST:  OMNIPAQUE IOHEXOL 300 MG/ML  SOLN COMPARISON:  None Available. FINDINGS: Bones/Joint/Cartilage No evidence of fracture or dislocation. No cortical  erosion or periosteal reaction to suggest osteomyelitis. Degenerative changes of the multiple midfoot joints. Degenerative changes of the knee joint with subchondral cystic changes in the patellofemoral lateral tibiofemoral compartments. Ligaments Suboptimally assessed by CT. Muscles and Tendons Muscles are normal in bulk. No intramuscular hematoma or fluid collection. Tendons of the flexor and extensor compartments are intact. Achilles tendon is intact. Soft tissues Marked skin thickening about the lateral aspect of the leg with subcutaneous soft tissue edema suggesting cellulitis. No fluid collection or abscess, noncontrast examination limits evaluation. IMPRESSION: 1. Marked skin thickening about the lateral aspect of the leg with subcutaneous soft tissue edema suggesting cellulitis. No fluid collection or abscess, noncontrast examination limits evaluation. 2. No evidence of fracture or dislocation. No cortical erosion or periosteal reaction to suggest osteomyelitis. 3. Degenerative changes of the knee and multiple midfoot joints. Electronically Signed   By: Larose Hires D.O.   On: 07/15/2022 16:32        Scheduled Meds:  apixaban  5 mg Oral BID   ascorbic acid  500 mg Oral Daily   folic acid  1 mg Oral Daily   furosemide  40 mg Intravenous BID   metoprolol succinate  100 mg Oral BID   midodrine  5 mg Oral BID WC   potassium chloride SA  20 mEq Oral Daily   Continuous Infusions:   ceFAZolin (ANCEF) IV 1 g  (07/17/22 1008)     LOS: 4 days    Time spent: over 30 min    Lacretia Nicks, MD [facility]   To contact the attending provider between 7A-7P or the covering provider during after hours 7P-7A, please log into the web site [email or web address] and access using universal [place] password for that web site. If you do not have the password, please call the hospital operator.  07/17/2022, 3:37 PM

## 2022-07-17 NOTE — Progress Notes (Signed)
PT Cancellation Note  Patient Details Name: Christine Jacobson MRN: [id number] DOB: [birth year]   Cancelled Treatment:    Reason Eval/Treat Not Completed:  Pt declined PT participation on today.    Faye Ramsay, PT Acute Rehabilitation  Office: [phone]

## 2022-07-18 DIAGNOSIS — L03115 Cellulitis of right lower limb: Secondary | ICD-10-CM | POA: Diagnosis not present

## 2022-07-18 DIAGNOSIS — L03116 Cellulitis of left lower limb: Secondary | ICD-10-CM | POA: Diagnosis not present

## 2022-07-18 LAB — COMPREHENSIVE METABOLIC PANEL
ALT: 23 U/L (ref 0–44)
AST: 45 U/L — ABNORMAL HIGH (ref 15–41)
Albumin: 2.7 g/dL — ABNORMAL LOW (ref 3.5–5.0)
Alkaline Phosphatase: 268 U/L — ABNORMAL HIGH (ref 38–126)
Anion gap: 9 (ref 5–15)
BUN: 24 mg/dL — ABNORMAL HIGH (ref 8–23)
CO2: 24 mmol/L (ref 22–32)
Calcium: 9.4 mg/dL (ref 8.9–10.3)
Chloride: 107 mmol/L (ref 98–111)
Creatinine, Ser: 1.05 mg/dL — ABNORMAL HIGH (ref 0.44–1.00)
GFR, Estimated: 58 mL/min — ABNORMAL LOW (ref >60)
Glucose, Bld: 105 mg/dL — ABNORMAL HIGH (ref 70–99)
Potassium: 3.3 mmol/L — ABNORMAL LOW (ref 3.5–5.1)
Sodium: 140 mmol/L (ref 135–145)
Total Bilirubin: 0.3 mg/dL (ref 0.3–1.2)
Total Protein: 5.8 g/dL — ABNORMAL LOW (ref 6.5–8.1)

## 2022-07-18 LAB — CBC WITH DIFFERENTIAL/PLATELET
Abs Immature Granulocytes: 0.16 10*3/uL — ABNORMAL HIGH (ref 0.00–0.07)
Basophils Absolute: 0.1 10*3/uL (ref 0.0–0.1)
Basophils Relative: 1 %
Eosinophils Absolute: 0.6 10*3/uL — ABNORMAL HIGH (ref 0.0–0.5)
Eosinophils Relative: 6 %
HCT: 22.9 % — ABNORMAL LOW (ref 36.0–46.0)
Hemoglobin: 7.1 g/dL — ABNORMAL LOW (ref 12.0–15.0)
Immature Granulocytes: 2 %
Lymphocytes Relative: 13 %
Lymphs Abs: 1.3 10*3/uL (ref 0.7–4.0)
MCH: 31.3 pg (ref 26.0–34.0)
MCHC: 31 g/dL (ref 30.0–36.0)
MCV: 100.9 fL — ABNORMAL HIGH (ref 80.0–100.0)
Monocytes Absolute: 0.8 10*3/uL (ref 0.1–1.0)
Monocytes Relative: 8 %
Neutro Abs: 6.8 10*3/uL (ref 1.7–7.7)
Neutrophils Relative %: 70 %
Platelets: 265 10*3/uL (ref 150–400)
RBC: 2.27 MIL/uL — ABNORMAL LOW (ref 3.87–5.11)
RDW: 17.6 % — ABNORMAL HIGH (ref 11.5–15.5)
WBC: 9.8 10*3/uL (ref 4.0–10.5)
nRBC: 0.9 % — ABNORMAL HIGH (ref 0.0–0.2)

## 2022-07-18 LAB — CULTURE, BLOOD (ROUTINE X 2)
Culture: NO GROWTH
Special Requests: ADEQUATE
Special Requests: ADEQUATE

## 2022-07-18 LAB — PHOSPHORUS: Phosphorus: 2.8 mg/dL (ref 2.5–4.6)

## 2022-07-18 LAB — MAGNESIUM: Magnesium: 2.1 mg/dL (ref 1.7–2.4)

## 2022-07-18 LAB — BRAIN NATRIURETIC PEPTIDE: B Natriuretic Peptide: 848.2 pg/mL — ABNORMAL HIGH (ref 0.0–100.0)

## 2022-07-18 MED ORDER — HYDROXYZINE HCL 10 MG PO TABS
10.0000 mg | ORAL_TABLET | Freq: Three times a day (TID) | ORAL | Status: DC | PRN
  Administered 2022-07-18 – 2022-07-20 (×7): 10 mg via ORAL
  Filled 2022-07-18 (×7): qty 1

## 2022-07-18 MED ORDER — POTASSIUM CHLORIDE CRYS ER 20 MEQ PO TBCR
40.0000 meq | EXTENDED_RELEASE_TABLET | ORAL | Status: AC
  Administered 2022-07-18 (×2): 40 meq via ORAL
  Filled 2022-07-18 (×2): qty 2

## 2022-07-18 NOTE — Progress Notes (Signed)
Mobility Specialist - Progress Note   07/18/22 1504  Mobility  Activity Ambulated with assistance in hallway;Ambulated with assistance to bathroom  Level of Assistance Standby assist, set-up cues, supervision of patient - no hands on  Assistive Device Front wheel walker  Distance Ambulated (ft) 50 ft  Range of Motion/Exercises Active  Activity Response Tolerated well  Mobility Referral Yes  $Mobility charge 1 Mobility  Mobility Specialist Start Time (ACUTE ONLY) 1457  Mobility Specialist Stop Time (ACUTE ONLY) 1504  Mobility Specialist Time Calculation (min) (ACUTE ONLY) 7 min   Pt was found on recliner chair and agreeable to ambulate. Grew fatigued with session and at EOS returned to use bathroom. Pt agreed to pull the call bell when finished.   Billey Chang Mobility Specialist

## 2022-07-18 NOTE — Progress Notes (Signed)
PROGRESS NOTE    Christine Jacobson  RUE:[id number] DOB: 05/01/1955 DOA: 07/13/2022 PCP: Karenann Cai, NP  Chief Complaint  Patient presents with   BLE cellulitis    Brief Narrative:   Christine Jacobson is Christine Jacobson 67 y.o. female with medical history significant of chest pain, hypertension, hyperprolactinemia, mild hyperlipidemia, obesity, palpitations, pyoderma gangrenosum, class II obesity, history of sepsis due to cellulitis who presented to the emergency department complaints of bilateral lower extremity cellulitis that is not improving on recent oral antibiotic course (cephalexin).  She stated she had Christine Jacobson fever associated with chills and malaise on Monday.  No further fevers since.  She is Christine Jacobson 9 rhinorrhea, sore throat, wheezing or hemoptysis.  No chest pain, palpitations, diaphoresis, PND, orthopnea or pitting edema of the lower extremities.  No abdominal pain, nausea, emesis, diarrhea, constipation, melena or hematochezia.  No flank pain, dysuria, frequency or hematuria.  No polyuria, polydipsia, polyphagia or blurred vision.    Lab work: CBC 0 white count 10.7, hemoglobin 8.9 g/dL and platelets 147.  CMP with Christine Jacobson chloride of 115, CO2 of 14 mmol/L with Christine Jacobson normal anion gap.  The rest of the electrolytes were normal.  Glucose 97, BUN 36 and creatinine 1.29 mg/dL.  Albumin was 3.2 g/dL and alkaline phosphatase of 131 units/L.  The rest of the LFTs were normal.    ED course: Initial vital signs were temperature 97.7 F, pulse 72, respiration 18, BP 96/69 mmHg and O2 sat 100% on room air.  The patient received hydromorphone 0.5 mg IVP, NS 1000 mL bolus and ceftriaxone 2 g IVPB.    Assessment & Plan:   Principal Problem:   Cellulitis of both lower extremities Active Problems:   Mild hyperlipidemia   Hypertension   Chronic diastolic heart failure (HCC)   Obesity   Persistent atrial fibrillation (HCC)   Hypocarbia   Normocytic anemia  Tachypnea  Shortness of Breath Noted by nursing and on exam ->  seems improved overall, she notes Christine Jacobson portion of anxiety CXR without active lung disease Will transition to IV lasix Seems to be gradually improving  Bilateral LE Cellulitis Will monitor on ancef Mild leukocytosis, trend CT without fluid collection or abscess Follow blood cultures (Ngx3 days)  Bilateral LE Edema  Will resume home diuretic -> IV diuresis as above Follow BNP (elevated, lower than previous) Prior echo with severe concentric hypertrophy of the left ventricle, hypertrophic cardiomyopathy, normal EF  Hypertrophic Cardiomyopathy  Chronic Diastolic Heart Failure  Moderate Pulmonary Hypertension  Last saw Dr. Servando Salina, was supposed to be on torsemide 20 mg BID Will order this for her here -> IV diuresis, legs seem better with this  Anxiety Hydroxyzine   Hypokalemia Replace, follow   Non Anion Gap Metabolic Acidosis  CKD IIIa Will continue to monitor, some mild CKD May need bicarb, will follow   Atrial Fibrillation Metoprolol, eliquis   Hypotension On midodrine, follow blood pressure  Anemia  Folate Deficiency Downtrending, will continue to monitor Folate supplementation  Obesity Body mass index is 35.23 kg/m.  Dyslipidemia Follow     DVT prophylaxis: eliquis Code Status: DNR Family Communication: none Disposition:   Status is: Inpatient Remains inpatient appropriate because: need for IV abx   Consultants:  none  Procedures:  none  Antimicrobials:  Anti-infectives (From admission, onward)    Start     Dose/Rate Route Frequency Ordered Stop   07/14/22 1400  cefTRIAXone (ROCEPHIN) 2 g in sodium chloride 0.9 % 100 mL IVPB  Status:  Discontinued  2 g 200 mL/hr over 30 Minutes Intravenous Every 24 hours 07/13/22 1534 07/14/22 0755   07/14/22 0900  ceFAZolin (ANCEF) IVPB 1 g/50 mL premix        1 g 100 mL/hr over 30 Minutes Intravenous Every 8 hours 07/14/22 0755 07/21/22 0859   07/13/22 1400  cefTRIAXone (ROCEPHIN) 2 g in sodium chloride  0.9 % 100 mL IVPB        2 g 200 mL/hr over 30 Minutes Intravenous  Once 07/13/22 1348 07/13/22 1446       Subjective: Notes some anxiety, asking for something  Objective: Vitals:   07/18/22 0508 07/18/22 0815 07/18/22 1022 07/18/22 1207  BP: 115/69 (!) 103/57 111/66 106/60  Pulse: 69 75 84 72  Resp: 16 19  19   Temp: 98.7 F (37.1 C)   97.8 F (36.6 C)  TempSrc: Oral   Oral  SpO2: 96%   97%  Weight:      Height:        Intake/Output Summary (Last 24 hours) at 07/18/2022 1558 Last data filed at 07/18/2022 0839 Gross per 24 hour  Intake 810 ml  Output --  Net 810 ml   Filed Weights   07/13/22 1059 07/13/22 1605 07/17/22 0446  Weight: 96.6 kg 96.6 kg 93.1 kg    Examination:  General: No acute distress. Cardiovascular: RRR Lungs: unlabored Abdomen: Soft, nontender, nondistended with normal active bowel sounds. No masses. No hepatosplenomegaly. Neurological: Alert and oriented 3. Moves all extremities 4 with equal strength. Cranial nerves II through XII grossly intact. Extremities: dressing to bilateral LE's   Data Reviewed: I have personally reviewed following labs and imaging studies  CBC: Recent Labs  Lab 07/13/22 1223 07/14/22 0423 07/15/22 0752 07/16/22 0806 07/17/22 0407 07/18/22 0339  WBC 10.7* 11.3* 10.2 10.5 9.9 9.8  NEUTROABS 8.0*  --   --   --  6.5 6.8  HGB 8.9* 7.9* 7.7* 7.4* 7.6* 7.1*  HCT 28.3* 25.4* 24.8* 23.9* 24.8* 22.9*  MCV 99.3 101.6* 101.2* 103.0* 102.5* 100.9*  PLT 256 248 244 262 273 265    Basic Metabolic Panel: Recent Labs  Lab 07/14/22 0423 07/15/22 0752 07/16/22 0806 07/17/22 0407 07/18/22 0339  NA 138 138 136 141 140  K 3.8 3.4* 3.0* 4.1 3.3*  CL 116* 113* 109 109 107  CO2 16* 18* 20* 23 24  GLUCOSE 120* 128* 96 113* 105*  BUN 32* 28* 26* 25* 24*  CREATININE 1.31* 1.23* 1.18* 1.14* 1.05*  CALCIUM 9.4 9.2 9.1 9.6 9.4  MG  --  2.2  --  2.2 2.1  PHOS  --  3.1  --  3.2 2.8    GFR: Estimated Creatinine Clearance:  56 mL/min (Ellenore Roscoe) (by C-G formula based on SCr of 1.05 mg/dL (H)).  Liver Function Tests: Recent Labs  Lab 07/13/22 1223 07/14/22 0423 07/17/22 0407 07/18/22 0339  AST 33 27 42* 45*  ALT 39 33 26 23  ALKPHOS 231* 197* 237* 268*  BILITOT 0.6 0.5 0.4 0.3  PROT 6.6 5.7* 6.0* 5.8*  ALBUMIN 3.2* 2.7* 2.8* 2.7*    CBG: No results for input(s): "GLUCAP" in the last 168 hours.   Recent Results (from the past 240 hour(s))  Blood culture (routine x 2)     Status: None   Collection Time: 07/13/22 12:15 PM   Specimen: BLOOD  Result Value Ref Range Status   Specimen Description   Final    BLOOD BLOOD LEFT FOREARM Performed at [facility], [address] [place], [place] [address]    Special Requests   Final    BOTTLES DRAWN AEROBIC AND ANAEROBIC Blood Culture adequate volume Performed at [facility], [address] [place], [place] [address]    Culture   Final    NO GROWTH 5 DAYS Performed at [facility], [address], [place], [place] [address]    Report Status 07/18/2022 FINAL  Final  Blood culture (routine x 2)     Status: None   Collection Time: 07/13/22 12:23 PM   Specimen: BLOOD  Result Value Ref Range Status   Specimen Description   Final    BLOOD BLOOD RIGHT ARM Performed at [facility], [address] [place], [place] [address]    Special Requests   Final    BOTTLES DRAWN AEROBIC AND ANAEROBIC Blood Culture adequate volume Performed at [facility], [address] [place], [place] [address]    Culture   Final    NO GROWTH 5 DAYS Performed at [facility], [address], [place], [place] [address]    Report Status 07/18/2022 FINAL  Final         Radiology Studies: No results found.      Scheduled Meds:  apixaban  5 mg Oral BID   ascorbic acid  500 mg Oral Daily   folic acid  1 mg Oral Daily   furosemide  40 mg Intravenous BID   metoprolol succinate  100  mg Oral BID   midodrine  5 mg Oral BID WC   potassium chloride SA  20 mEq Oral Daily   Continuous Infusions:   ceFAZolin (ANCEF) IV 1 g (07/18/22 0828)     LOS: 5 days    Time spent: over 30 min    Lacretia Nicks, MD [facility]   To contact the attending provider between 7A-7P or the covering provider during after hours 7P-7A, please log into the web site [email or web address] and access using universal [place] password for that web site. If you do not have the password, please call the hospital operator.  07/18/2022, 3:58 PM

## 2022-07-18 NOTE — Care Management Important Message (Signed)
Important Message  Patient Details IM Letter given. Name: Brydie Mcwhinney MRN: [id number] Date of Birth: Jun 02, 1955   Medicare Important Message Given:  Yes     Caren Macadam 07/18/2022, 3:32 PM

## 2022-07-18 NOTE — Progress Notes (Signed)
Chaplain engaged in an initial visit with Asami. Brynli became tearful expressing her pervasive loneliness and sadness. A major reason for her current feelings is the recent dissolution of an important relationship to her. She voiced that she has experienced a great deal of "meanness" in her life and doesn't understand why. Chaplain recognized that Janneth does not have a great deal of support around her outside of a best friend named [name]. Malaina primarily is by herself and yearns for human connection. Chaplain assesses that Latayna's heightened emotions and desire to not have any hard things happening in her life is because she doesn't have a significant and loving community surrounding her.   Chaplain worked to offer encouragement to Narcissa and think about ways that she can build or find community. Carmella desires to get back to work if she can. Chaplain also shared that being a part of a faith community could be helpful as she voiced that she is [place] and used to attend synagogue. Chaplain tried to inspire hope within [place].   Chaplain also assesses that having some therapeutic resources would be helpful. Zynia would benefit from a consistent source to process and share with. Darnika also vocalized that she would never harm herself but that she has thought about dying and how that would be better than living.   Chaplain offered reflective listening, compassionate presence, a blessing, and support.   Chaplain Aulton Routt, MDiv  07/18/22 1100  Spiritual Encounters  Type of Visit Initial  Care provided to: Patient  Reason for visit Routine spiritual support  Spiritual Framework  Presenting Themes Community and relationships;Meaning/purpose/sources of inspiration;Impactful experiences and emotions;Courage hope and growth;Significant life change  Needs/Challenges/Barriers Lack of community  Patient Stress Factors Exhausted;Major life changes;Loss of control  Interventions  Spiritual Care Interventions  Made Encouragement;Supported grief process;Narrative/life review;Normalization of emotions;Reflective listening;Compassionate presence;Established relationship of care and support;Explored values/beliefs/practices/strengths  Intervention Outcomes  Outcomes Awareness of support;Reduced isolation;Connection to spiritual care;Awareness around self/spiritual resourses  Spiritual Care Plan  Recommendations for Clinical Staff Therapeutic Resources

## 2022-07-19 DIAGNOSIS — L03115 Cellulitis of right lower limb: Secondary | ICD-10-CM | POA: Diagnosis not present

## 2022-07-19 DIAGNOSIS — L03116 Cellulitis of left lower limb: Secondary | ICD-10-CM | POA: Diagnosis not present

## 2022-07-19 LAB — COMPREHENSIVE METABOLIC PANEL
ALT: 21 U/L (ref 0–44)
AST: 46 U/L — ABNORMAL HIGH (ref 15–41)
Albumin: 2.7 g/dL — ABNORMAL LOW (ref 3.5–5.0)
Alkaline Phosphatase: 272 U/L — ABNORMAL HIGH (ref 38–126)
Anion gap: 7 (ref 5–15)
BUN: 23 mg/dL (ref 8–23)
CO2: 23 mmol/L (ref 22–32)
Calcium: 9 mg/dL (ref 8.9–10.3)
Chloride: 105 mmol/L (ref 98–111)
Creatinine, Ser: 1.01 mg/dL — ABNORMAL HIGH (ref 0.44–1.00)
GFR, Estimated: >60 mL/min (ref >60)
Glucose, Bld: 94 mg/dL (ref 70–99)
Potassium: 4.1 mmol/L (ref 3.5–5.1)
Sodium: 135 mmol/L (ref 135–145)
Total Bilirubin: 0.4 mg/dL (ref 0.3–1.2)
Total Protein: 5.7 g/dL — ABNORMAL LOW (ref 6.5–8.1)

## 2022-07-19 LAB — CBC WITH DIFFERENTIAL/PLATELET
Abs Immature Granulocytes: 0.14 10*3/uL — ABNORMAL HIGH (ref 0.00–0.07)
Basophils Absolute: 0.1 10*3/uL (ref 0.0–0.1)
Basophils Relative: 1 %
Eosinophils Absolute: 0.7 10*3/uL — ABNORMAL HIGH (ref 0.0–0.5)
Eosinophils Relative: 8 %
HCT: 23.4 % — ABNORMAL LOW (ref 36.0–46.0)
Hemoglobin: 7 g/dL — ABNORMAL LOW (ref 12.0–15.0)
Immature Granulocytes: 2 %
Lymphocytes Relative: 15 %
Lymphs Abs: 1.4 10*3/uL (ref 0.7–4.0)
MCH: 30.7 pg (ref 26.0–34.0)
MCHC: 29.9 g/dL — ABNORMAL LOW (ref 30.0–36.0)
MCV: 102.6 fL — ABNORMAL HIGH (ref 80.0–100.0)
Monocytes Absolute: 0.8 10*3/uL (ref 0.1–1.0)
Monocytes Relative: 8 %
Neutro Abs: 6.1 10*3/uL (ref 1.7–7.7)
Neutrophils Relative %: 66 %
Platelets: 253 10*3/uL (ref 150–400)
RBC: 2.28 MIL/uL — ABNORMAL LOW (ref 3.87–5.11)
RDW: 17.8 % — ABNORMAL HIGH (ref 11.5–15.5)
WBC: 9.3 10*3/uL (ref 4.0–10.5)
nRBC: 0.9 % — ABNORMAL HIGH (ref 0.0–0.2)

## 2022-07-19 LAB — PREPARE RBC (CROSSMATCH)

## 2022-07-19 LAB — MAGNESIUM: Magnesium: 2 mg/dL (ref 1.7–2.4)

## 2022-07-19 LAB — ABO/RH: ABO/RH(D): A POS

## 2022-07-19 LAB — PHOSPHORUS: Phosphorus: 2.7 mg/dL (ref 2.5–4.6)

## 2022-07-19 LAB — BPAM RBC: Unit Type and Rh: 6200

## 2022-07-19 MED ORDER — SODIUM CHLORIDE 0.9% IV SOLUTION: Freq: Once | INTRAVENOUS | Status: AC

## 2022-07-19 NOTE — Progress Notes (Signed)
PROGRESS NOTE    Reyhana Jacobson  ZOX:[id number] DOB: 05/22/1955 DOA: 07/13/2022 PCP: Christine Cai, NP  Chief Complaint  Patient presents with   BLE cellulitis    Brief Narrative:   Christine Jacobson is Christine Jacobson 67 y.o. female with medical history significant of chest pain, hypertension, hyperprolactinemia, mild hyperlipidemia, obesity, palpitations, pyoderma gangrenosum, class II obesity, history of sepsis due to cellulitis who presented to the emergency department complaints of bilateral lower extremity cellulitis that is not improving on recent oral antibiotic course (cephalexin).    Improving from cellulitis standpoint.  Getting unit of pRBC 7/2.  Likely can d/c within next 24-48 hrs if she continues to improve and blood counts are stable.    Assessment & Plan:   Principal Problem:   Cellulitis of both lower extremities Active Problems:   Mild hyperlipidemia   Hypertension   Chronic diastolic heart failure (HCC)   Obesity   Persistent atrial fibrillation (HCC)   Hypocarbia   Normocytic anemia  Bilateral LE Cellulitis Will monitor on ancef (x7 days) Mild leukocytosis, trend CT without fluid collection or abscess Follow blood cultures (Ngx3 days) Initially held off on unna boots with need to reevaluate cellulitis, this has improved, I think it'd be ok to resume these at discharge if recommended by wound care RN  Bilateral LE Edema  Will resume home diuretic -> IV diuresis as above Follow BNP (elevated, lower than previous) Prior echo with severe concentric hypertrophy of the left ventricle, hypertrophic cardiomyopathy, normal EF  Hypertrophic Cardiomyopathy  Chronic Diastolic Heart Failure  Moderate Pulmonary Hypertension  Last saw Dr. Servando Salina, was supposed to be on torsemide 20 mg BID Will order this for her here -> IV diuresis, legs seem better with this  Anemia  Folate Deficiency Downtrending, will continue to monitor Folate supplementation With hb 7, will transfuse 1  unit pRBC and follow - discussed risks/benefits - she's on eliquis, but no evidence of bleeding Watch for signs of bleeding  Tachypnea  Shortness of Breath Noted by nursing and on exam -> seems improved overall, she notes Christine Jacobson portion of anxiety CXR without active lung disease Will transition to IV lasix Seems to be gradually improving  Anxiety Hydroxyzine   Hypokalemia Replace, follow   Non Anion Gap Metabolic Acidosis  CKD IIIa Will continue to monitor, some mild CKD May need bicarb, will follow   Atrial Fibrillation Metoprolol, eliquis   Hypotension On midodrine, follow blood pressure  Obesity Body mass index is 35.23 kg/m.  Dyslipidemia Follow     DVT prophylaxis: eliquis Code Status: DNR Family Communication: none Disposition:   Status is: Inpatient Remains inpatient appropriate because: need for IV abx   Consultants:  none  Procedures:  none  Antimicrobials:  Anti-infectives (From admission, onward)    Start     Dose/Rate Route Frequency Ordered Stop   07/14/22 1400  cefTRIAXone (ROCEPHIN) 2 g in sodium chloride 0.9 % 100 mL IVPB  Status:  Discontinued        2 g 200 mL/hr over 30 Minutes Intravenous Every 24 hours 07/13/22 1534 07/14/22 0755   07/14/22 0900  ceFAZolin (ANCEF) IVPB 1 g/50 mL premix        1 g 100 mL/hr over 30 Minutes Intravenous Every 8 hours 07/14/22 0755 07/21/22 0859   07/13/22 1400  cefTRIAXone (ROCEPHIN) 2 g in sodium chloride 0.9 % 100 mL IVPB        2 g 200 mL/hr over 30 Minutes Intravenous  Once 07/13/22 1348 07/13/22 1446  Subjective: No new complaints  Objective: Vitals:   07/19/22 0507 07/19/22 0509 07/19/22 0830 07/19/22 1232  BP: 128/73  102/72 109/63  Pulse: 73   79  Resp: 18 17  20   Temp: 98.1 F (36.7 C)   98.4 F (36.9 C)  TempSrc: Oral   Oral  SpO2: 97%   97%  Weight:      Height:        Intake/Output Summary (Last 24 hours) at 07/19/2022 1548 Last data filed at 07/19/2022 1534 Gross per 24  hour  Intake 610 ml  Output 1750 ml  Net -1140 ml   Filed Weights   07/13/22 1059 07/13/22 1605 07/17/22 0446  Weight: 96.6 kg 96.6 kg 93.1 kg    Examination:  General: No acute distress. Cardiovascular: RRR Lungs: diminished, unlabored Neurological: Alert and oriented 3. Moves all extremities 4 with equal strength. Cranial nerves II through XII grossly intact. Extremities: legs less swollen, wounds gradually improving  Data Reviewed: I have personally reviewed following labs and imaging studies  CBC: Recent Labs  Lab 07/13/22 1223 07/14/22 0423 07/15/22 0752 07/16/22 0806 07/17/22 0407 07/18/22 0339 07/19/22 0411  WBC 10.7*   < > 10.2 10.5 9.9 9.8 9.3  NEUTROABS 8.0*  --   --   --  6.5 6.8 6.1  HGB 8.9*   < > 7.7* 7.4* 7.6* 7.1* 7.0*  HCT 28.3*   < > 24.8* 23.9* 24.8* 22.9* 23.4*  MCV 99.3   < > 101.2* 103.0* 102.5* 100.9* 102.6*  PLT 256   < > 244 262 273 265 253   < > = values in this interval not displayed.    Basic Metabolic Panel: Recent Labs  Lab 07/15/22 0752 07/16/22 0806 07/17/22 0407 07/18/22 0339 07/19/22 0411  NA 138 136 141 140 135  K 3.4* 3.0* 4.1 3.3* 4.1  CL 113* 109 109 107 105  CO2 18* 20* 23 24 23   GLUCOSE 128* 96 113* 105* 94  BUN 28* 26* 25* 24* 23  CREATININE 1.23* 1.18* 1.14* 1.05* 1.01*  CALCIUM 9.2 9.1 9.6 9.4 9.0  MG 2.2  --  2.2 2.1 2.0  PHOS 3.1  --  3.2 2.8 2.7    GFR: Estimated Creatinine Clearance: 58.2 mL/min (Christine Jacobson) (by C-G formula based on SCr of 1.01 mg/dL (H)).  Liver Function Tests: Recent Labs  Lab 07/13/22 1223 07/14/22 0423 07/17/22 0407 07/18/22 0339 07/19/22 0411  AST 33 27 42* 45* 46*  ALT 39 33 26 23 21   ALKPHOS 231* 197* 237* 268* 272*  BILITOT 0.6 0.5 0.4 0.3 0.4  PROT 6.6 5.7* 6.0* 5.8* 5.7*  ALBUMIN 3.2* 2.7* 2.8* 2.7* 2.7*    CBG: No results for input(s): "GLUCAP" in the last 168 hours.   Recent Results (from the past 240 hour(s))  Blood culture (routine x 2)     Status: None    Collection Time: 07/13/22 12:15 PM   Specimen: BLOOD  Result Value Ref Range Status   Specimen Description   Final    BLOOD BLOOD LEFT FOREARM Performed at [facility], [address], [place], [place] [address]    Special Requests   Final    BOTTLES DRAWN AEROBIC AND ANAEROBIC Blood Culture adequate volume Performed at [facility], [address] [place], [place] [address]    Culture   Final    NO GROWTH 5 DAYS Performed at [facility], [address], [place], [place] [address]  Report Status 07/18/2022 FINAL  Final  Blood culture (routine x 2)     Status: None   Collection Time: 07/13/22 12:23 PM   Specimen: BLOOD  Result Value Ref Range Status   Specimen Description   Final    BLOOD BLOOD RIGHT ARM Performed at [facility], [address] [place], [place] [address]    Special Requests   Final    BOTTLES DRAWN AEROBIC AND ANAEROBIC Blood Culture adequate volume Performed at [facility], [address], [place], [place] [address]    Culture   Final    NO GROWTH 5 DAYS Performed at [facility], [address], [place], [place] [address]    Report Status 07/18/2022 FINAL  Final         Radiology Studies: No results found.      Scheduled Meds:  sodium chloride   Intravenous Once   apixaban  5 mg Oral BID   ascorbic acid  500 mg Oral Daily   folic acid  1 mg Oral Daily   furosemide  40 mg Intravenous BID   metoprolol succinate  100 mg Oral BID   midodrine  5 mg Oral BID WC   potassium chloride SA  20 mEq Oral Daily   Continuous Infusions:   ceFAZolin (ANCEF) IV Stopped (07/19/22 0907)     LOS: 6 days    Time spent: over 30 min    Lacretia Nicks, MD [facility]   To contact the attending provider between 7A-7P or the covering provider during after hours 7P-7A, please log into the web site [email or web address] and access using universal [place]  password for that web site. If you do not have the password, please call the hospital operator.  07/19/2022, 3:48 PM

## 2022-07-19 NOTE — Progress Notes (Signed)
Physical Therapy Treatment Patient Details Name: Christine Jacobson MRN: [id number] DOB: October 29, 1955 Today's Date: 07/19/2022   History of Present Illness 67 y.o. female with medical history significant of chest pain, hypertension, hyperprolactinemia, mild hyperlipidemia, obesity, palpitations, pyoderma gangrenosum, class II obesity, history of sepsis due to cellulitis who presented to the emergency department complaints of bilateral lower extremity cellulitis that is not improving on recent oral antibiotic course. Pt had a recent admission 5/16-5/23/24 for AKI.    PT Comments  General Comments: AxO x 3 anxious/nervous and slightly disorganized in her thoughts.  She lives home alone and has "her way" of doing things.  Max c/o fatigue (HgB 7.0) and mild dizziness.  BP 112/78 HR 86. "All I want to do today is sleep".  Pt did agree to get OOB and amb to bathroom.  Assisted back to bed per pt request to rest. Pt plans to return home alone but has a friend who helps.     Assistance Recommended at Discharge Intermittent Supervision/Assistance  If plan is discharge home, recommend the following:  Can travel by private vehicle    A little help with bathing/dressing/bathroom;Assistance with cooking/housework;Assist for transportation;Help with stairs or ramp for entrance      Equipment Recommendations  None recommended by PT    Recommendations for Other Services       Precautions / Restrictions Precautions Precautions: Fall Precaution Comments: B LE ACE wrap Restrictions Weight Bearing Restrictions: No     Mobility  Bed Mobility Overal bed mobility: Needs Assistance Bed Mobility: Supine to Sit, Sit to Supine     Supine to sit: Supervision Sit to supine: Supervision   General bed mobility comments: increased effort and time this session due to max c/o fatigue.    Transfers Overall transfer level: Needs assistance Equipment used: Rolling walker (2 wheels) Transfers: Sit to/from  Stand Sit to Stand: Supervision           General transfer comment: self able with good use B UE's.  Self able to rise from toilet and perform self peri care as well as standing at sink to wash her hands.    Ambulation/Gait Ambulation/Gait assistance: Min guard, Supervision Gait Distance (Feet): 22 Feet (11 feet x 2) Assistive device: Rolling walker (2 wheels) Gait Pattern/deviations: Step-through pattern, Decreased stride length Gait velocity: decr     General Gait Details: limited distance to and from bathroom due to MAX c/o weakness/fatigue.  (HgB 7.0)  Pt amb 11 feet x 2 with walker.   Stairs             Wheelchair Mobility     Tilt Bed    Modified Rankin (Stroke Patients Only)       Balance                                            Cognition Arousal/Alertness: Awake/alert Behavior During Therapy: WFL for tasks assessed/performed Overall Cognitive Status: Within Functional Limits for tasks assessed                                 General Comments: AxO x 3 anxious/nervous and slightly disorganized in her thoughts.  She lives home alone and has "her way" of doing things.  Max c/o fatigue (HgB 7.0) and mild dizziness.  BP 112/78 HR 86. "All I want  to do today is sleep".  Pt did agree to get OOB and amb to bathroom.        Exercises      General Comments        Pertinent Vitals/Pain Pain Assessment Faces Pain Scale: Hurts little more Pain Location: bil LEs/feet Pain Descriptors / Indicators: Burning, Grimacing Pain Intervention(s): Monitored during session, Repositioned    Home Living                          Prior Function            PT Goals (current goals can now be found in the care plan section) Progress towards PT goals: Progressing toward goals    Frequency    Min 1X/week      PT Plan Current plan remains appropriate    Co-evaluation              AM-PAC PT "6 Clicks"  Mobility   Outcome Measure  Help needed turning from your back to your side while in a flat bed without using bedrails?: A Little Help needed moving from lying on your back to sitting on the side of a flat bed without using bedrails?: A Little Help needed moving to and from a bed to a chair (including a wheelchair)?: A Little Help needed standing up from a chair using your arms (e.g., wheelchair or bedside chair)?: A Little Help needed to walk in hospital room?: A Little Help needed climbing 3-5 steps with a railing? : A Lot 6 Click Score: 17    End of Session Equipment Utilized During Treatment: Gait belt Activity Tolerance: Patient limited by fatigue Patient left: in bed;with call bell/phone within reach;with bed alarm set Nurse Communication: Mobility status PT Visit Diagnosis: Difficulty in walking, not elsewhere classified (R26.2)     Time: 1525-1550 PT Time Calculation (min) (ACUTE ONLY): 25 min  Charges:    $Gait Training: 8-22 mins $Therapeutic Activity: 8-22 mins PT General Charges $$ ACUTE PT VISIT: 1 Visit                     Felecia Shelling  PTA Acute  Rehabilitation Services Office M-F          [phone]

## 2022-07-20 DIAGNOSIS — L03116 Cellulitis of left lower limb: Secondary | ICD-10-CM | POA: Diagnosis not present

## 2022-07-20 DIAGNOSIS — L03115 Cellulitis of right lower limb: Secondary | ICD-10-CM | POA: Diagnosis not present

## 2022-07-20 LAB — BASIC METABOLIC PANEL
Anion gap: 9 (ref 5–15)
BUN: 21 mg/dL (ref 8–23)
CO2: 27 mmol/L (ref 22–32)
Calcium: 9.2 mg/dL (ref 8.9–10.3)
Chloride: 101 mmol/L (ref 98–111)
Creatinine, Ser: 1.07 mg/dL — ABNORMAL HIGH (ref 0.44–1.00)
GFR, Estimated: 56 mL/min — ABNORMAL LOW (ref >60)
Glucose, Bld: 102 mg/dL — ABNORMAL HIGH (ref 70–99)
Potassium: 3.9 mmol/L (ref 3.5–5.1)
Sodium: 137 mmol/L (ref 135–145)

## 2022-07-20 LAB — TYPE AND SCREEN
ABO/RH(D): A POS
Antibody Screen: NEGATIVE
Unit division: 0

## 2022-07-20 LAB — BPAM RBC
Blood Product Expiration Date: 202407302359
ISSUE DATE / TIME: 202407021815

## 2022-07-20 LAB — CBC
HCT: 26.5 % — ABNORMAL LOW (ref 36.0–46.0)
Hemoglobin: 8.3 g/dL — ABNORMAL LOW (ref 12.0–15.0)
MCH: 31.7 pg (ref 26.0–34.0)
MCHC: 31.3 g/dL (ref 30.0–36.0)
MCV: 101.1 fL — ABNORMAL HIGH (ref 80.0–100.0)
Platelets: 244 10*3/uL (ref 150–400)
RBC: 2.62 MIL/uL — ABNORMAL LOW (ref 3.87–5.11)
RDW: 17.4 % — ABNORMAL HIGH (ref 11.5–15.5)
WBC: 9.8 10*3/uL (ref 4.0–10.5)
nRBC: 0.5 % — ABNORMAL HIGH (ref 0.0–0.2)

## 2022-07-20 LAB — PHOSPHORUS: Phosphorus: 2.7 mg/dL (ref 2.5–4.6)

## 2022-07-20 LAB — MAGNESIUM: Magnesium: 1.9 mg/dL (ref 1.7–2.4)

## 2022-07-20 MED ORDER — CEPHALEXIN 500 MG PO CAPS
500.0000 mg | ORAL_CAPSULE | Freq: Two times a day (BID) | ORAL | Status: DC
  Administered 2022-07-20 – 2022-07-21 (×3): 500 mg via ORAL
  Filled 2022-07-20 (×3): qty 1

## 2022-07-20 NOTE — Progress Notes (Signed)
PROGRESS NOTE   Christine Jacobson  ZOX:[id number] DOB: 14-Mar-1955 DOA: 07/13/2022 PCP: Karenann Cai, NP  Brief Narrative:  67 year old female Paroxysmal A-fib CHADVASC >4 on Eliquis --attempt at [facility] HFpEF--baseline office weight 06/17/2022 203 pounds (has been as high as 219)--- at that OV was noted that patient was intermittently compliant on torsemide Chronic lower extremity wounds followed at wound center previously Hyperlipidemia Prior pyoderma gangrenosum Hyperprolactinemia  Presented to the emergency room on 6/26 with no improvement on Keflex also some rhinorrhea White count 10 electrolytes normal LFTs normal-in ED received NS bolus for slightly low blood pressure 96/70, ceftriaxone etc.   Hospital-Problem based course  Bilateral lower extremity cellulitis Wounds examined today and do not appear to be grossly or overtly infected Will transition IV Ancef to Keflex 500 twice daily stop date 07/22/2022 Resume unna boots  Acute superimposed on chronic decompensated HFpEF Underlying paroxysmal A-fib CHADVASC >4  continuing Lasix 40 IV twice daily--continuing Toprol-XL 100 twice daily--- also is on midodrine 5 twice daily meals  is still [+]500 cc but weight down from 96--->93 [205]--- her baseline weight is 203 Standing wght if possible--OOB to chair At discharge would resume Demadex 20 twice daily  Depression Continue Zoloft 50 at bedtime  Prior pyoderma gangrenosum  Hyperprolactinemia?  Where this was characterized No current evidence for the same suggest outpatient discussion with endocrinology regarding workup   DVT prophylaxis: Apixaban Code Status: full Family Communication: none Disposition:  Status is: Inpatient Remains inpatient appropriate because:   Transition iv-->po  Subjective: Awake coherent no distress no icterus no pallor no rales no rhonchi Seems quite anxious about going home-worried about the same Has a nurse that comes out twice a week to  see her and has been getting lower extremity wraps for a while Does not see wound care  Objective: Vitals:   07/19/22 1839 07/19/22 1958 07/19/22 2056 07/20/22 0536  BP: 122/72 112/72 124/74 116/68  Pulse:  79  73  Resp: 20 16 20 17   Temp: 98.2 F (36.8 C) 98.5 F (36.9 C) 98.9 F (37.2 C) 98.3 F (36.8 C)  TempSrc: Oral Oral Oral Oral  SpO2: 96% 96% 92% 96%  Weight:      Height:        Intake/Output Summary (Last 24 hours) at 07/20/2022 0821 Last data filed at 07/20/2022 0600 Gross per 24 hour  Intake 964.2 ml  Output 1450 ml  Net -485.8 ml   Filed Weights   07/13/22 1059 07/13/22 1605 07/17/22 0446  Weight: 96.6 kg 96.6 kg 93.1 kg    Examination:  Anxious awake coherent no distress Thick neck Mallampati 4 Chest is clear no wheeze no rales no rhonchi Abdomen is obese nontender no rebound no guarding Wounds as per below      Data Reviewed: personally reviewed   CBC    Component Value Date/Time   WBC 9.8 07/20/2022 0402   RBC 2.62 (L) 07/20/2022 0402   HGB 8.3 (L) 07/20/2022 0402   HGB 12.8 01/04/2022 1259   HCT 26.5 (L) 07/20/2022 0402   HCT 37.2 01/04/2022 1259   PLT 244 07/20/2022 0402   PLT 192 01/04/2022 1259   MCV 101.1 (H) 07/20/2022 0402   MCV 96 01/04/2022 1259   MCH 31.7 07/20/2022 0402   MCHC 31.3 07/20/2022 0402   RDW 17.4 (H) 07/20/2022 0402   RDW 14.2 01/04/2022 1259   LYMPHSABS 1.4 07/19/2022 0411   MONOABS 0.8 07/19/2022 0411   EOSABS 0.7 (H) 07/19/2022 0411  BASOSABS 0.1 07/19/2022 0411      Latest Ref Rng & Units 07/20/2022    4:02 AM 07/19/2022    4:11 AM 07/18/2022    3:39 AM  CMP  Glucose 70 - 99 mg/dL 409  94  811   BUN 8 - 23 mg/dL 21  23  24    Creatinine 0.44 - 1.00 mg/dL 9.14  7.82  9.56   Sodium 135 - 145 mmol/L 137  135  140   Potassium 3.5 - 5.1 mmol/L 3.9  4.1  3.3   Chloride 98 - 111 mmol/L 101  105  107   CO2 22 - 32 mmol/L 27  23  24    Calcium 8.9 - 10.3 mg/dL 9.2  9.0  9.4   Total Protein 6.5 - 8.1 g/dL  5.7  5.8    Total Bilirubin 0.3 - 1.2 mg/dL  0.4  0.3   Alkaline Phos 38 - 126 U/L  272  268   AST 15 - 41 U/L  46  45   ALT 0 - 44 U/L  21  23      Radiology Studies: No results found.   Scheduled Meds:  apixaban  5 mg Oral BID   ascorbic acid  500 mg Oral Daily   folic acid  1 mg Oral Daily   furosemide  40 mg Intravenous BID   metoprolol succinate  100 mg Oral BID   midodrine  5 mg Oral BID WC   potassium chloride SA  20 mEq Oral Daily   Continuous Infusions:   ceFAZolin (ANCEF) IV 1 g (07/20/22 0145)     LOS: 7 days   Time spent: 96  Rhetta Mura, MD [facility] To contact the attending provider between 7A-7P or the covering provider during after hours 7P-7A, please log into the web site [email or web address] and access using universal  password for that web site. If you do not have the password, please call the hospital operator.  07/20/2022, 8:21 AM

## 2022-07-20 NOTE — Progress Notes (Signed)
Mobility Specialist - Progress Note   07/20/22 1345  Mobility  Activity Ambulated with assistance to bathroom  Level of Assistance Standby assist, set-up cues, supervision of patient - no hands on  Assistive Device Front wheel walker  Distance Ambulated (ft) 10 ft  Range of Motion/Exercises Active  Activity Response Tolerated well  Mobility Referral Yes  $Mobility charge 1 Mobility  Mobility Specialist Start Time (ACUTE ONLY) 1335  Mobility Specialist Stop Time (ACUTE ONLY) 1345  Mobility Specialist Time Calculation (min) (ACUTE ONLY) 10 min   Pt was found in bed wanting to ambulate to bathroom. No complaints and understood to pull call bell when finished.  Billey Chang Mobility Specialist

## 2022-07-20 NOTE — Progress Notes (Signed)
Mobility Specialist - Progress Note   07/20/22 1532  Mobility  Activity Ambulated with assistance in hallway  Level of Assistance Contact guard assist, steadying assist  Assistive Device Front wheel walker  Distance Ambulated (ft) 140 ft  Range of Motion/Exercises Active Assistive  Activity Response Tolerated fair  Mobility Referral Yes  $Mobility charge 1 Mobility  Mobility Specialist Start Time (ACUTE ONLY) 1515  Mobility Specialist Stop Time (ACUTE ONLY) 1530  Mobility Specialist Time Calculation (min) (ACUTE ONLY) 15 min   Pt was found in bed wanting to ambulate. Had x3 brief standing rest breaks due to fatigue. At EOS returned to bed with all needs met. Call bell in reach.  Billey Chang Mobility Specialist

## 2022-07-20 NOTE — Consult Note (Addendum)
WOC Nurse re-consult Note: Refer to previous WOC consult note from 6/27. Requested to reassess bilat legs and revise the plan of care. Pt states wounds have greatly improved since admission and she was wearing Una boots prior to admission which were changed weekly by home health.  These were not ordered upon admission, since legs had a large amt drainage, which has decreased at this time. Discussed plan of care with patient and she agrees.  Left leg with patchy areas of partial thickness skin loss and one area of full thickness wound; 2X5X.3cm, red and moist, mod amt tan drainage.  Right posterior leg with full thickness wound, 50% red, 50% yellow, mod amt tan drainage, 15X8X.3cm.  Dressing procedure/placement/frequency: Topical treatment orders provided for ortho tech and should be continued by home health after discharge: Ortho tech please apply bilat Una boots and coban and change Q WED.  Leave Aquacel in place to right leg before applying Una boot, please remove tape.  Please re-consult if further assistance is needed.  Thank-you,  Cammie Mcgee MSN, RN, CWOCN, [place], CNS [phone]

## 2022-07-20 NOTE — Progress Notes (Signed)
Chaplain engaged in follow-up visit with [facility]. Christine Jacobson was resting and voiced she is doing well today. She was grateful to get some medicine to help with her anxiety. Chaplain offered compassionate presence and continued support.     07/20/22 0900  Spiritual Encounters  Type of Visit Follow up  Reason for visit Routine spiritual support

## 2022-07-20 NOTE — Progress Notes (Signed)
Orthopedic Tech Progress Note Patient Details:  Christine Jacobson 09/12/1955 [id number]  Ortho Devices Type of Ortho Device: Radio broadcast assistant Ortho Device/Splint Location: bi lateral unna boot applied Ortho Device/Splint Interventions: Ordered, Application, Adjustment   Post Interventions Patient Tolerated: Well Instructions Provided: Care of device, Adjustment of device  [name] 07/20/2022, 2:30 PM

## 2022-07-21 DIAGNOSIS — L03115 Cellulitis of right lower limb: Secondary | ICD-10-CM | POA: Diagnosis not present

## 2022-07-21 DIAGNOSIS — L03116 Cellulitis of left lower limb: Secondary | ICD-10-CM | POA: Diagnosis not present

## 2022-07-21 LAB — CBC WITH DIFFERENTIAL/PLATELET
Abs Immature Granulocytes: 0.07 10*3/uL (ref 0.00–0.07)
Basophils Absolute: 0.1 10*3/uL (ref 0.0–0.1)
Basophils Relative: 1 %
Eosinophils Absolute: 0.9 10*3/uL — ABNORMAL HIGH (ref 0.0–0.5)
Eosinophils Relative: 10 %
HCT: 25.5 % — ABNORMAL LOW (ref 36.0–46.0)
Hemoglobin: 7.9 g/dL — ABNORMAL LOW (ref 12.0–15.0)
Immature Granulocytes: 1 %
Lymphocytes Relative: 16 %
Lymphs Abs: 1.4 10*3/uL (ref 0.7–4.0)
MCH: 31.1 pg (ref 26.0–34.0)
MCHC: 31 g/dL (ref 30.0–36.0)
MCV: 100.4 fL — ABNORMAL HIGH (ref 80.0–100.0)
Monocytes Absolute: 0.7 10*3/uL (ref 0.1–1.0)
Monocytes Relative: 8 %
Neutro Abs: 5.7 10*3/uL (ref 1.7–7.7)
Neutrophils Relative %: 64 %
Platelets: 245 10*3/uL (ref 150–400)
RBC: 2.54 MIL/uL — ABNORMAL LOW (ref 3.87–5.11)
RDW: 17 % — ABNORMAL HIGH (ref 11.5–15.5)
WBC: 8.9 10*3/uL (ref 4.0–10.5)
nRBC: 0.2 % (ref 0.0–0.2)

## 2022-07-21 LAB — BASIC METABOLIC PANEL
Anion gap: 9 (ref 5–15)
BUN: 21 mg/dL (ref 8–23)
CO2: 29 mmol/L (ref 22–32)
Calcium: 9.3 mg/dL (ref 8.9–10.3)
Chloride: 99 mmol/L (ref 98–111)
Creatinine, Ser: 1.01 mg/dL — ABNORMAL HIGH (ref 0.44–1.00)
GFR, Estimated: >60 mL/min (ref >60)
Glucose, Bld: 106 mg/dL — ABNORMAL HIGH (ref 70–99)
Potassium: 3.3 mmol/L — ABNORMAL LOW (ref 3.5–5.1)
Sodium: 137 mmol/L (ref 135–145)

## 2022-07-21 MED ORDER — POTASSIUM CHLORIDE CRYS ER 20 MEQ PO TBCR: 40.0000 meq | EXTENDED_RELEASE_TABLET | Freq: Two times a day (BID) | ORAL | 0 refills | Status: DC

## 2022-07-21 MED ORDER — AQUAPHOR EX OINT: TOPICAL_OINTMENT | CUTANEOUS | 0 refills | Status: DC | PRN

## 2022-07-21 MED ORDER — HYDROXYZINE HCL 10 MG PO TABS: 10.0000 mg | ORAL_TABLET | Freq: Three times a day (TID) | ORAL | 0 refills | Status: DC | PRN

## 2022-07-21 MED ORDER — POTASSIUM CHLORIDE CRYS ER 20 MEQ PO TBCR
40.0000 meq | EXTENDED_RELEASE_TABLET | Freq: Two times a day (BID) | ORAL | Status: DC
  Administered 2022-07-21: 40 meq via ORAL
  Filled 2022-07-21: qty 2

## 2022-07-21 MED ORDER — CEPHALEXIN 500 MG PO CAPS: 500.0000 mg | ORAL_CAPSULE | Freq: Two times a day (BID) | ORAL | 0 refills | Status: DC

## 2022-07-21 NOTE — TOC Transition Note (Signed)
Transition of Care [facility]) - CM/SW Discharge Note   Patient Details  Name: Christine Jacobson MRN: [id number] Date of Birth: 13-Dec-1955  Transition of Care [facility]) CM/SW Contact:  Adrian Prows, RN Phone Number: 07/21/2022, 11:36 AM   Clinical Narrative:    D/C orders received; HHPT/RN previously arranged w/ Frances Furbish; Cindie Sillmon at agency; follow up for wound care center tomorrow as closed today; contact info for both placed in follow up provider section of d/c instructions; no TOC needs.   Final next level of care: Home w Home Health Services Barriers to Discharge: No Barriers Identified   Patient Goals and CMS Choice      Discharge Placement                         Discharge Plan and Services Additional resources added to the After Visit Summary for                                       Social Determinants of Health (SDOH) Interventions SDOH Screenings   Food Insecurity: No Food Insecurity (07/13/2022)  Recent Concern: Food Insecurity - Food Insecurity Present (06/02/2022)  Housing: Low Risk  (07/13/2022)  Transportation Needs: No Transportation Needs (07/13/2022)  Utilities: Not At Risk (07/13/2022)  Depression (PHQ2-9): Low Risk  (10/26/2021)  Financial Resource Strain: Low Risk  (12/03/2021)  Tobacco Use: Medium Risk (07/13/2022)     Readmission Risk Interventions    07/14/2022    2:40 PM  Readmission Risk Prevention Plan  Post Dischage Appt Complete  Medication Screening Complete

## 2022-07-21 NOTE — Care Management Important Message (Signed)
Important Message  Patient Details IM Letter given Name: Christine Jacobson MRN: [id number] Date of Birth: 1955/11/21   Medicare Important Message Given:  Yes     Caren Macadam 07/21/2022, 11:50 AM

## 2022-07-21 NOTE — Discharge Summary (Signed)
Physician Discharge Summary  Christine Jacobson WUJ:[id number] DOB: 12/28/1955 DOA: 07/13/2022  PCP: Karenann Cai, NP  Admit date: 07/13/2022 Discharge date: 07/21/2022  Time spent: 27 minutes  Recommendations for Outpatient Follow-up:  Needs review of lower extremity with home health RN etc. in the outpatient setting-could consider outpatient wound care referral for post LE wounds Check magnesium potassium in about 1 week and adjust diuretics/potassium replacement as able She has not been taking Zoloft at home-she was placed on hydroxyzine this hospital stay and would continue this-please ensure that a depression screening is done in the outpatient setting Please refer as an outpatient to gastroenterology-May need endoscopy/colonoscopy given she is on Eliquis and has had a slow drop in her hemoglobin over the past several months She carries a diagnosis of hyperprolactinemia and I am not sure where this is characterized-she may benefit from screening labs in the outpatient  Discharge Diagnoses:  MAIN problem for hospitalization   Bilateral lower extremity cellulitis Acute superimposed on chronic decompensated HFpEF  paroxysmal atrial fibrillation on apixaban Depression Prior pyoderma gangrenosum   Please see below for itemized issues addressed in HOpsital- refer to other progress notes for clarity if needed  Discharge Condition: Improved  Diet recommendation: Heart healthy  Filed Weights   07/13/22 1059 07/13/22 1605 07/17/22 0446  Weight: 96.6 kg 96.6 kg 93.1 kg    History of present illness:  67 year old female Paroxysmal A-fib CHADVASC >4 on Eliquis --attempt at DCCV HCOM HFpEF--baseline office weight 06/17/2022 203 pounds (has been as high as 219)--- at that OV was noted that patient was intermittently compliant on torsemide 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  Hospital Course:   Bilateral lower extremity edema with superimposed mild cellulitis Wound exam 7/3    Some excoriations on the front but open areas on the back and I feel patient would benefit from Unna boots with Aquaphor and changing every 3 to 5 days to promote healing-May benefit from outpatient wound care referral per PCP  Acute superimposed on chronic decompensated HFpEF Patient was transiently tachypneic prompting [place] to start IV diuresis-not clear that she was taking her Demadex at home Her prior echo showed concentric left ventricular hypertrophy with hypertrophic cardiomyopathy and normal EF I suspect some of her edema was from this On discharge because she was not taking the Demadex she was discharged on Lasix 40 twice daily-she will need salt restriction and outpatient review  Mild hypokalemia Slightly low on discharge replacing with meds  Paroxysmal A-fib Patient was maintained on metoprolol 100 twice daily, Eliquis additionally and was stable  Anemia with hemoglobin of 7-she had no signs of bleeding her baseline seems to be in the 9-10 range- It was felt she will need outpatient follow-up with regards to this as it seems indoplent and patient is also on Eliquis  Anxiety Not compliant with sertraline at home therefore hydroxyzine 10 3 times daily was started and we prescribed this at discharge  Discharge Exam: Vitals:   07/21/22 0608 07/21/22 1007  BP: 128/69 112/70  Pulse: 72 70  Resp: 19   Temp: 98 F (36.7 C)   SpO2: 97%     Subj on day of d/c   Anxious no distress no pain no fever sitting in the chair  Smiling had breakfast  General Exam on discharge  EOMI NCAT no focal deficit 6.  Mallampati 4 S2 no murmur no rub no gallop Abdomen soft no rebound no  guarding ROM intact CTAB Wounds wrapped in [employer] boot  Discharge Instructions   Discharge Instructions     Call MD for:  difficulty breathing, headache or visual disturbances   Complete by: As directed    Call MD for:  persistant nausea and vomiting   Complete by: As directed    Diet - low sodium heart healthy   Complete by: As directed    Discharge instructions   Complete by: As directed    Please look at your meds carefully-you will need a little bit of extra potassium given all of the fluid that we have been getting off of you and make sure that you take the potassium pills when you can pick them up from your pharmacy You should resume your Lasix twice a day 40 mg and check your weight daily-on our records your home weight is about 203 to 204 pounds so please use this as your "dry weight" Please weigh yourself daily on the same scale with the same amount of clothing and shoes on so that you can check your fluid status Your lower extremities left pretty good-I have asked that the transition manager try to arrange wound care consult in the outpatient but for now you should continue with the wrappings of your lower extremity   Increase activity slowly   Complete by: As directed    No wound care   Complete by: As directed    Keep Unna boots on and change them every 3 to 5 days with home health nursing etc.-your primary care physician should be able to monitor and refer you to wound care if you get an open sore      Allergies as of 07/21/2022       Reactions   Pork-derived Products         Medication List     STOP taking these medications    acetaminophen 325 MG tablet Commonly known as: TYLENOL   HYDROcodone-acetaminophen 5-325 MG tablet Commonly known as: NORCO/VICODIN   Ibuprofen 200 MG Caps   sertraline 50 MG tablet Commonly known as: Zoloft   torsemide 20 MG tablet Commonly known as: DEMADEX       TAKE these medications    apixaban 5 MG Tabs  tablet Commonly known as: Eliquis Take 1 tablet (5 mg total) by mouth 2 (two) times daily.   ascorbic acid 500 MG tablet Commonly known as: VITAMIN C Take 1 tablet (500 mg total) by mouth daily for 30 days   cephALEXin 500 MG capsule Commonly known as: KEFLEX Take 1 capsule (500 mg total) by mouth every 12 (twelve) hours. What changed:  medication strength how much to take when to take this   furosemide 40 MG tablet Commonly known as: LASIX Take 40 mg by mouth 2 (two) times daily.   hydrOXYzine 10 MG tablet Commonly known as: ATARAX Take 1 tablet (10 mg total) by mouth 3 (three) times daily as needed for anxiety.   metoprolol succinate 100 MG 24 hr tablet Commonly known as: TOPROL-XL TAKE ONE TABLET BY MOUTH TWICE A DAY WITH OR IMMEDIATELY FOLLOWING A MEAL What changed:  how much to take how to take this when to take this additional instructions   midodrine 5 MG tablet Commonly known as: PROAMATINE Take 1 tablet (5 mg total) by mouth 2 (  two) times daily with a meal.   mineral oil-hydrophilic petrolatum ointment Apply topically as needed for dry skin. Use with Unna boots please at the back of the wounds on the lower extremities when the Unna boots are changed   polyethylene glycol powder 17 GM/SCOOP powder Commonly known as: GLYCOLAX/MIRALAX Take 1 capful (17 g) by mouth daily as needed for mild constipation.   potassium chloride SA 20 MEQ tablet Commonly known as: KLOR-CON M Take 2 tablets (40 mEq total) by mouth 2 (two) times daily. What changed:  how much to take when to take this   Zinc Sulfate 220 (50 Zn) MG Tabs Take 1 tablet (220 mg total) by mouth daily for 30 days       Allergies  Allergen Reactions   Pork-Derived Products     Follow-up Information     [facility] Follow up.   Specialty: Home Health Services Why: Home Health Physical Therapy Home Health Skilled Nursing (RN) Contact information: [address] [place] [address] [phone]                  The results of significant diagnostics from this hospitalization (including imaging, microbiology, ancillary and laboratory) are listed below for reference.    Significant Diagnostic Studies: DG CHEST PORT 1 VIEW  Result Date: 07/16/2022 CLINICAL DATA:  Shortness of breath. EXAM: PORTABLE CHEST 1 VIEW COMPARISON:  06/02/2022 FINDINGS: Stable cardiomegaly.  Both lungs are clear. IMPRESSION: Stable cardiomegaly.  No active lung disease. Electronically Signed   By: Danae Orleans M.D.   On: 07/16/2022 14:10   CT TIBIA FIBULA RIGHT W CONTRAST  Result Date: 07/15/2022 CLINICAL DATA:  Soft tissue infection suspected. EXAM: CT OF THE LOWER RIGHT EXTREMITY WITH CONTRAST TECHNIQUE: Multidetector CT imaging of the lower right extremity was performed according to the standard protocol following intravenous contrast administration. RADIATION DOSE REDUCTION: This exam was performed according to the departmental dose-optimization program which includes automated exposure control, adjustment of the mA and/or kV according to patient size and/or use of iterative reconstruction technique. CONTRAST:  OMNIPAQUE IOHEXOL 300 MG/ML  SOLN COMPARISON:  None Available. FINDINGS: Bones/Joint/Cartilage No evidence of fracture or dislocation. No cortical erosion or periosteal reaction to suggest osteomyelitis. Mild degenerative changes of the patellofemoral compartment. No appreciable joint effusion. Ligaments Suboptimally assessed by CT. Muscles and Tendons Muscles are normal in bulk. No intramuscular hematoma or fluid collection. Tendons of the flexor, extensor and peroneal compartments are intact. Achilles tendon is intact. Soft tissues Skin thickening and subcutaneous soft tissue edema prominent about the lower leg suggesting severe cellulitis. No fluid collection or abscess on this noncontrast examination. IMPRESSION: 1. Skin thickening and subcutaneous soft  tissue edema prominent about the lower leg suggesting cellulitis. No fluid collection or abscess on this noncontrast enhanced examination. 2. No evidence of fracture or dislocation. No CT evidence of osteomyelitis. Electronically Signed   By: Larose Hires D.O.   On: 07/15/2022 16:35   CT TIBIA FIBULA LEFT W CONTRAST  Result Date: 07/15/2022 CLINICAL DATA:  Soft tissue infection suspected. EXAM: CT OF THE LOWER LEFT EXTREMITY WITH CONTRAST TECHNIQUE: Multidetector CT imaging of the lower left extremity was performed according to the standard protocol following intravenous contrast administration. RADIATION DOSE REDUCTION: This exam was performed according to the departmental dose-optimization program which includes automated exposure control, adjustment of the mA and/or kV according to patient size and/or use of iterative reconstruction technique. CONTRAST:  OMNIPAQUE IOHEXOL 300 MG/ML  SOLN COMPARISON:  None Available. FINDINGS: Bones/Joint/Cartilage No evidence of fracture or dislocation. No cortical erosion or periosteal reaction to suggest osteomyelitis. Degenerative changes of the multiple midfoot joints. Degenerative changes of the knee joint with subchondral cystic changes in the patellofemoral lateral tibiofemoral compartments. Ligaments Suboptimally assessed by CT. Muscles and Tendons Muscles are normal in bulk. No intramuscular hematoma or fluid collection. Tendons of the flexor and extensor compartments are intact. Achilles tendon is intact. Soft tissues Marked skin thickening about the lateral aspect of the leg with subcutaneous soft tissue edema suggesting cellulitis. No fluid collection or abscess, noncontrast examination limits evaluation. IMPRESSION: 1. Marked skin thickening about the lateral aspect of the leg with subcutaneous soft tissue edema suggesting cellulitis. No fluid collection or abscess, noncontrast examination limits evaluation. 2. No evidence of fracture or dislocation. No  cortical erosion or periosteal reaction to suggest osteomyelitis. 3. Degenerative changes of the knee and multiple midfoot joints. Electronically Signed   By: Larose Hires D.O.   On: 07/15/2022 16:32    Microbiology: Recent Results (from the past 240 hour(s))  Blood culture (routine x 2)     Status: None   Collection Time: 07/13/22 12:15 PM   Specimen: BLOOD  Result Value Ref Range Status   Specimen Description   Final    BLOOD BLOOD LEFT FOREARM Performed at [facility], [address] [place], [place] [address]    Special Requests   Final    BOTTLES DRAWN AEROBIC AND ANAEROBIC Blood Culture adequate volume Performed at [facility], [address] [place], [place] [address]    Culture   Final    NO GROWTH 5 DAYS Performed at [facility], [address], [place], [place] [address]    Report Status 07/18/2022 FINAL  Final  Blood culture (routine x 2)     Status: None   Collection Time: 07/13/22 12:23 PM   Specimen: BLOOD  Result Value Ref Range Status   Specimen Description   Final    BLOOD BLOOD RIGHT ARM Performed at [facility], [address] [place], [place] [address]    Special Requests   Final    BOTTLES DRAWN AEROBIC AND ANAEROBIC Blood Culture adequate volume Performed at [facility], [address], [place], [place] [address]    Culture   Final    NO GROWTH 5 DAYS Performed at [facility], [address] [place], [place] [address]    Report Status 07/18/2022 FINAL  Final     Labs: Basic Metabolic Panel: Recent Labs  Lab 07/15/22 0752 07/16/22 0806 07/17/22 0407 07/18/22 0339 07/19/22 0411 07/20/22 0402 07/21/22 0346  NA 138   < > 141 140 135 137 137  K 3.4*   < > 4.1 3.3* 4.1 3.9 3.3*  CL 113*   < > 109 107 105 101 99  CO2 18*   < > 23 24 23 27 29   GLUCOSE 128*   < > 113* 105* 94 102* 106*  BUN 28*   < > 25* 24* 23 21 21   CREATININE 1.23*   < > 1.14*  1.05* 1.01* 1.07* 1.01*  CALCIUM 9.2   < > 9.6 9.4 9.0 9.2 9.3  MG 2.2  --  2.2 2.1 2.0 1.9  --   PHOS 3.1  --  3.2 2.8 2.7 2.7  --    < > = values in this interval not displayed.   Liver Function Tests:  Recent Labs  Lab 07/17/22 0407 07/18/22 0339 07/19/22 0411  AST 42* 45* 46*  ALT 26 23 21   ALKPHOS 237* 268* 272*  BILITOT 0.4 0.3 0.4  PROT 6.0* 5.8* 5.7*  ALBUMIN 2.8* 2.7* 2.7*   No results for input(s): "LIPASE", "AMYLASE" in the last 168 hours. No results for input(s): "AMMONIA" in the last 168 hours. CBC: Recent Labs  Lab 07/17/22 0407 07/18/22 0339 07/19/22 0411 07/20/22 0402 07/21/22 0346  WBC 9.9 9.8 9.3 9.8 8.9  NEUTROABS 6.5 6.8 6.1  --  5.7  HGB 7.6* 7.1* 7.0* 8.3* 7.9*  HCT 24.8* 22.9* 23.4* 26.5* 25.5*  MCV 102.5* 100.9* 102.6* 101.1* 100.4*  PLT 273 265 253 244 245   Cardiac Enzymes: No results for input(s): "CKTOTAL", "CKMB", "CKMBINDEX", "TROPONINI" in the last 168 hours. BNP: BNP (last 3 results) Recent Labs    07/14/22 1615 07/16/22 0806 07/18/22 0339  BNP 1,062.2* 1,055.7* 848.2*    ProBNP (last 3 results) No results for input(s): "PROBNP" in the last 8760 hours.  CBG: No results for input(s): "GLUCAP" in the last 168 hours.     Signed:  Rhetta Mura MD   [facility] 07/21/2022, 10:34 AM

## 2022-07-21 NOTE — Plan of Care (Signed)
°  Problem: Clinical Measurements: °Goal: Ability to avoid or minimize complications of infection will improve °Outcome: Progressing °  °Problem: Skin Integrity: °Goal: Skin integrity will improve °Outcome: Progressing °  °Problem: Clinical Measurements: °Goal: Ability to avoid or minimize complications of infection will improve °Outcome: Progressing °  °Problem: Skin Integrity: °Goal: Skin integrity will improve °Outcome: Progressing °  °

## 2022-07-21 NOTE — Progress Notes (Signed)
Mobility Specialist - Progress Note   07/21/22 0901  Mobility  Activity Ambulated with assistance in hallway  Level of Assistance Standby assist, set-up cues, supervision of patient - no hands on  Assistive Device Front wheel walker  Distance Ambulated (ft) 124 ft  Range of Motion/Exercises Active  Activity Response Tolerated well  Mobility Referral Yes  $Mobility charge 1 Mobility  Mobility Specialist Start Time (ACUTE ONLY) 0850  Mobility Specialist Stop Time (ACUTE ONLY) 0901  Mobility Specialist Time Calculation (min) (ACUTE ONLY) 11 min   Pt was found in bed and agreeable to ambulate. Had no complaints during ambulation. Fatigued with session and at EOS returned to bed with all needs met. Call bell in reach.  Billey Chang Mobility Specialist

## 2022-07-22 ENCOUNTER — Other Ambulatory Visit: Payer: Self-pay | Admitting: *Deleted

## 2022-07-22 DIAGNOSIS — I87332 Chronic venous hypertension (idiopathic) with ulcer and inflammation of left lower extremity: Secondary | ICD-10-CM

## 2022-07-22 NOTE — TOC Progression Note (Signed)
Transition of Care [facility]) - Progression Note    Patient Details  Name: Christine Jacobson MRN: [id number] Date of Birth: 1955/07/06  Transition of Care [facility]) CM/SW Contact  Adrian Prows, RN Phone Number: 07/22/2022, 9:29 AM  Clinical Narrative:    [facility] for consult; spoke w/ Dartha Lodge; she says pt was officially discharged from practice on 02/07/21 due to non-compliance and too many missed appts; she also said certified letter was sent to pt's home.     Barriers to Discharge: No Barriers Identified  Expected Discharge Plan and Services         Expected Discharge Date: 07/21/22                                     Social Determinants of Health (SDOH) Interventions SDOH Screenings   Food Insecurity: No Food Insecurity (07/13/2022)  Recent Concern: Food Insecurity - Food Insecurity Present (06/02/2022)  Housing: Low Risk  (07/13/2022)  Transportation Needs: No Transportation Needs (07/13/2022)  Utilities: Not At Risk (07/13/2022)  Depression (PHQ2-9): Low Risk  (10/26/2021)  Financial Resource Strain: Low Risk  (12/03/2021)  Tobacco Use: Medium Risk (07/13/2022)    Readmission Risk Interventions    07/14/2022    2:40 PM  Readmission Risk Prevention Plan  Post Dischage Appt Complete  Medication Screening Complete

## 2022-07-28 ENCOUNTER — Ambulatory Visit: Payer: Medicare HMO | Admitting: Cardiology

## 2022-08-04 ENCOUNTER — Emergency Department (HOSPITAL_COMMUNITY): Payer: Medicare HMO

## 2022-08-04 ENCOUNTER — Other Ambulatory Visit: Payer: Self-pay

## 2022-08-04 ENCOUNTER — Inpatient Hospital Stay (HOSPITAL_COMMUNITY)
Admission: EM | Admit: 2022-08-04 | Discharge: 2022-08-14 | DRG: 377 | Disposition: A | Discharge status: Home / Self Care | Payer: Medicare HMO | Attending: Internal Medicine | Admitting: Internal Medicine

## 2022-08-04 ENCOUNTER — Ambulatory Visit (HOSPITAL_COMMUNITY): Payer: Medicare HMO

## 2022-08-04 DIAGNOSIS — Z888 Allergy status to other drugs, medicaments and biological substances status: Secondary | ICD-10-CM

## 2022-08-04 DIAGNOSIS — D6869 Other thrombophilia: Secondary | ICD-10-CM | POA: Diagnosis not present

## 2022-08-04 DIAGNOSIS — E785 Hyperlipidemia, unspecified: Secondary | ICD-10-CM | POA: Diagnosis present

## 2022-08-04 DIAGNOSIS — I130 Hypertensive heart and chronic kidney disease with heart failure and stage 1 through stage 4 chronic kidney disease, or unspecified chronic kidney disease: Secondary | ICD-10-CM | POA: Diagnosis present

## 2022-08-04 DIAGNOSIS — N179 Acute kidney failure, unspecified: Secondary | ICD-10-CM | POA: Diagnosis present

## 2022-08-04 DIAGNOSIS — K648 Other hemorrhoids: Secondary | ICD-10-CM | POA: Diagnosis present

## 2022-08-04 DIAGNOSIS — E669 Obesity, unspecified: Secondary | ICD-10-CM | POA: Diagnosis present

## 2022-08-04 DIAGNOSIS — D509 Iron deficiency anemia, unspecified: Secondary | ICD-10-CM | POA: Diagnosis present

## 2022-08-04 DIAGNOSIS — I480 Paroxysmal atrial fibrillation: Secondary | ICD-10-CM | POA: Diagnosis present

## 2022-08-04 DIAGNOSIS — I5033 Acute on chronic diastolic (congestive) heart failure: Secondary | ICD-10-CM | POA: Diagnosis present

## 2022-08-04 DIAGNOSIS — I272 Pulmonary hypertension, unspecified: Secondary | ICD-10-CM | POA: Diagnosis present

## 2022-08-04 DIAGNOSIS — E876 Hypokalemia: Secondary | ICD-10-CM | POA: Diagnosis present

## 2022-08-04 DIAGNOSIS — Z79899 Other long term (current) drug therapy: Secondary | ICD-10-CM

## 2022-08-04 DIAGNOSIS — T45515A Adverse effect of anticoagulants, initial encounter: Secondary | ICD-10-CM | POA: Diagnosis present

## 2022-08-04 DIAGNOSIS — I872 Venous insufficiency (chronic) (peripheral): Secondary | ICD-10-CM | POA: Diagnosis present

## 2022-08-04 DIAGNOSIS — Z66 Do not resuscitate: Secondary | ICD-10-CM | POA: Diagnosis present

## 2022-08-04 DIAGNOSIS — I959 Hypotension, unspecified: Secondary | ICD-10-CM | POA: Diagnosis present

## 2022-08-04 DIAGNOSIS — N1831 Chronic kidney disease, stage 3a: Secondary | ICD-10-CM | POA: Diagnosis present

## 2022-08-04 DIAGNOSIS — D120 Benign neoplasm of cecum: Secondary | ICD-10-CM | POA: Diagnosis present

## 2022-08-04 DIAGNOSIS — Z6836 Body mass index (BMI) 36.0-36.9, adult: Secondary | ICD-10-CM

## 2022-08-04 DIAGNOSIS — I4819 Other persistent atrial fibrillation: Secondary | ICD-10-CM | POA: Diagnosis present

## 2022-08-04 DIAGNOSIS — K922 Gastrointestinal hemorrhage, unspecified: Principal | ICD-10-CM | POA: Insufficient documentation

## 2022-08-04 DIAGNOSIS — I421 Obstructive hypertrophic cardiomyopathy: Secondary | ICD-10-CM | POA: Diagnosis present

## 2022-08-04 DIAGNOSIS — I5032 Chronic diastolic (congestive) heart failure: Secondary | ICD-10-CM | POA: Diagnosis not present

## 2022-08-04 DIAGNOSIS — E860 Dehydration: Secondary | ICD-10-CM | POA: Diagnosis present

## 2022-08-04 DIAGNOSIS — D6832 Hemorrhagic disorder due to extrinsic circulating anticoagulants: Secondary | ICD-10-CM | POA: Diagnosis present

## 2022-08-04 DIAGNOSIS — M545 Low back pain, unspecified: Secondary | ICD-10-CM | POA: Diagnosis present

## 2022-08-04 DIAGNOSIS — L89312 Pressure ulcer of right buttock, stage 2: Secondary | ICD-10-CM | POA: Diagnosis present

## 2022-08-04 DIAGNOSIS — D62 Acute posthemorrhagic anemia: Secondary | ICD-10-CM | POA: Diagnosis present

## 2022-08-04 DIAGNOSIS — Z7901 Long term (current) use of anticoagulants: Secondary | ICD-10-CM

## 2022-08-04 DIAGNOSIS — Z91014 Allergy to mammalian meats: Secondary | ICD-10-CM

## 2022-08-04 DIAGNOSIS — D6859 Other primary thrombophilia: Secondary | ICD-10-CM | POA: Diagnosis present

## 2022-08-04 DIAGNOSIS — K5730 Diverticulosis of large intestine without perforation or abscess without bleeding: Secondary | ICD-10-CM | POA: Diagnosis present

## 2022-08-04 DIAGNOSIS — K2970 Gastritis, unspecified, without bleeding: Secondary | ICD-10-CM | POA: Diagnosis present

## 2022-08-04 DIAGNOSIS — K921 Melena: Secondary | ICD-10-CM | POA: Diagnosis present

## 2022-08-04 DIAGNOSIS — E861 Hypovolemia: Secondary | ICD-10-CM | POA: Diagnosis present

## 2022-08-04 DIAGNOSIS — Z8249 Family history of ischemic heart disease and other diseases of the circulatory system: Secondary | ICD-10-CM

## 2022-08-04 DIAGNOSIS — I10 Essential (primary) hypertension: Secondary | ICD-10-CM | POA: Diagnosis present

## 2022-08-04 DIAGNOSIS — Z886 Allergy status to analgesic agent status: Secondary | ICD-10-CM

## 2022-08-04 DIAGNOSIS — Z713 Dietary counseling and surveillance: Secondary | ICD-10-CM

## 2022-08-04 DIAGNOSIS — Z01810 Encounter for preprocedural cardiovascular examination: Secondary | ICD-10-CM | POA: Diagnosis not present

## 2022-08-04 DIAGNOSIS — R0902 Hypoxemia: Secondary | ICD-10-CM | POA: Diagnosis present

## 2022-08-04 DIAGNOSIS — Z87891 Personal history of nicotine dependence: Secondary | ICD-10-CM

## 2022-08-04 DIAGNOSIS — I4891 Unspecified atrial fibrillation: Secondary | ICD-10-CM | POA: Diagnosis not present

## 2022-08-04 DIAGNOSIS — R001 Bradycardia, unspecified: Secondary | ICD-10-CM | POA: Diagnosis present

## 2022-08-04 DIAGNOSIS — Z5941 Food insecurity: Secondary | ICD-10-CM

## 2022-08-04 DIAGNOSIS — I5030 Unspecified diastolic (congestive) heart failure: Secondary | ICD-10-CM | POA: Diagnosis not present

## 2022-08-04 LAB — D-DIMER, QUANTITATIVE: D-Dimer, Quant: 0.42 ug/mL-FEU (ref 0.00–0.50)

## 2022-08-04 LAB — CBC WITH DIFFERENTIAL/PLATELET
Abs Immature Granulocytes: 0.06 10*3/uL (ref 0.00–0.07)
Basophils Absolute: 0.1 10*3/uL (ref 0.0–0.1)
Basophils Relative: 1 %
Eosinophils Absolute: 0.2 10*3/uL (ref 0.0–0.5)
Eosinophils Relative: 2 %
HCT: 18.9 % — ABNORMAL LOW (ref 36.0–46.0)
Hemoglobin: 5.4 g/dL — CL (ref 12.0–15.0)
Immature Granulocytes: 1 %
Lymphocytes Relative: 12 %
Lymphs Abs: 1.1 10*3/uL (ref 0.7–4.0)
MCH: 28.4 pg (ref 26.0–34.0)
MCHC: 28.6 g/dL — ABNORMAL LOW (ref 30.0–36.0)
MCV: 99.5 fL (ref 80.0–100.0)
Monocytes Absolute: 0.5 10*3/uL (ref 0.1–1.0)
Monocytes Relative: 5 %
Neutro Abs: 7.7 10*3/uL (ref 1.7–7.7)
Neutrophils Relative %: 79 %
Platelets: 321 10*3/uL (ref 150–400)
RBC: 1.9 MIL/uL — ABNORMAL LOW (ref 3.87–5.11)
RDW: 17.3 % — ABNORMAL HIGH (ref 11.5–15.5)
WBC: 9.7 10*3/uL (ref 4.0–10.5)
nRBC: 0.5 % — ABNORMAL HIGH (ref 0.0–0.2)

## 2022-08-04 LAB — TROPONIN I (HIGH SENSITIVITY)
Troponin I (High Sensitivity): 15 ng/L (ref <18)
Troponin I (High Sensitivity): 13 ng/L (ref <18)

## 2022-08-04 LAB — COMPREHENSIVE METABOLIC PANEL
ALT: 15 U/L (ref 0–44)
AST: 15 U/L (ref 15–41)
Albumin: 3.3 g/dL — ABNORMAL LOW (ref 3.5–5.0)
Alkaline Phosphatase: 153 U/L — ABNORMAL HIGH (ref 38–126)
Anion gap: 10 (ref 5–15)
BUN: 49 mg/dL — ABNORMAL HIGH (ref 8–23)
CO2: 20 mmol/L — ABNORMAL LOW (ref 22–32)
Calcium: 9.3 mg/dL (ref 8.9–10.3)
Chloride: 109 mmol/L (ref 98–111)
Creatinine, Ser: 1.9 mg/dL — ABNORMAL HIGH (ref 0.44–1.00)
GFR, Estimated: 28 mL/min — ABNORMAL LOW (ref >60)
Glucose, Bld: 110 mg/dL — ABNORMAL HIGH (ref 70–99)
Potassium: 4.5 mmol/L (ref 3.5–5.1)
Sodium: 139 mmol/L (ref 135–145)
Total Bilirubin: 1 mg/dL (ref 0.3–1.2)
Total Protein: 6.2 g/dL — ABNORMAL LOW (ref 6.5–8.1)

## 2022-08-04 LAB — BRAIN NATRIURETIC PEPTIDE: B Natriuretic Peptide: 1630.3 pg/mL — ABNORMAL HIGH (ref 0.0–100.0)

## 2022-08-04 LAB — HEMOGLOBIN: Hemoglobin: 5.2 g/dL — CL (ref 12.0–15.0)

## 2022-08-04 LAB — PROTIME-INR
INR: 2.7 — ABNORMAL HIGH (ref 0.8–1.2)
Prothrombin Time: 29.1 seconds — ABNORMAL HIGH (ref 11.4–15.2)

## 2022-08-04 LAB — PREPARE RBC (CROSSMATCH)

## 2022-08-04 LAB — POC OCCULT BLOOD, ED: Fecal Occult Bld: POSITIVE — AB

## 2022-08-04 MED ORDER — ONDANSETRON HCL 4 MG/2ML IJ SOLN
4.0000 mg | Freq: Four times a day (QID) | INTRAMUSCULAR | Status: DC | PRN
  Administered 2022-08-05: 4 mg via INTRAVENOUS
  Filled 2022-08-04: qty 2

## 2022-08-04 MED ORDER — PANTOPRAZOLE SODIUM 40 MG IV SOLR: 40.0000 mg | INTRAVENOUS | Status: DC

## 2022-08-04 MED ORDER — FUROSEMIDE 10 MG/ML IJ SOLN
40.0000 mg | Freq: Once | INTRAMUSCULAR | Status: AC
  Administered 2022-08-04: 40 mg via INTRAVENOUS
  Filled 2022-08-04: qty 4

## 2022-08-04 MED ORDER — ONDANSETRON HCL 4 MG PO TABS: 4.0000 mg | ORAL_TABLET | Freq: Four times a day (QID) | ORAL | Status: DC | PRN

## 2022-08-04 MED ORDER — HYDROXYZINE HCL 10 MG PO TABS
10.0000 mg | ORAL_TABLET | Freq: Three times a day (TID) | ORAL | Status: DC | PRN
  Administered 2022-08-06 – 2022-08-11 (×4): 10 mg via ORAL
  Filled 2022-08-04 (×6): qty 1

## 2022-08-04 MED ORDER — SODIUM CHLORIDE 0.9% IV SOLUTION: Freq: Once | INTRAVENOUS | Status: AC

## 2022-08-04 MED ORDER — PANTOPRAZOLE SODIUM 40 MG IV SOLR
40.0000 mg | Freq: Once | INTRAVENOUS | Status: AC
  Administered 2022-08-04: 40 mg via INTRAVENOUS
  Filled 2022-08-04: qty 10

## 2022-08-04 MED ORDER — TRAZODONE HCL 50 MG PO TABS
25.0000 mg | ORAL_TABLET | Freq: Every evening | ORAL | Status: DC | PRN
  Administered 2022-08-09 – 2022-08-13 (×2): 25 mg via ORAL
  Filled 2022-08-04 (×2): qty 1

## 2022-08-04 MED ORDER — MIDODRINE HCL 5 MG PO TABS
5.0000 mg | ORAL_TABLET | Freq: Two times a day (BID) | ORAL | Status: DC
  Administered 2022-08-04 – 2022-08-14 (×19): 5 mg via ORAL
  Filled 2022-08-04 (×21): qty 1

## 2022-08-04 MED ORDER — TRAMADOL HCL 50 MG PO TABS
50.0000 mg | ORAL_TABLET | Freq: Three times a day (TID) | ORAL | Status: DC | PRN
  Administered 2022-08-05 – 2022-08-07 (×2): 50 mg via ORAL
  Filled 2022-08-04 (×3): qty 1

## 2022-08-04 MED ORDER — ALBUTEROL SULFATE (2.5 MG/3ML) 0.083% IN NEBU: 2.5000 mg | INHALATION_SOLUTION | RESPIRATORY_TRACT | Status: DC | PRN

## 2022-08-04 MED ORDER — SODIUM CHLORIDE 0.9 % IV BOLUS
500.0000 mL | Freq: Once | INTRAVENOUS | Status: AC
  Administered 2022-08-04: 500 mL via INTRAVENOUS

## 2022-08-04 MED ORDER — PANTOPRAZOLE SODIUM 40 MG IV SOLR
40.0000 mg | Freq: Two times a day (BID) | INTRAVENOUS | Status: DC
  Administered 2022-08-05 – 2022-08-08 (×7): 40 mg via INTRAVENOUS
  Filled 2022-08-04 (×7): qty 10

## 2022-08-04 NOTE — H&P (Signed)
History and Physical  Christine Jacobson GEX:[id number] DOB: [birth year] DOA: 08/04/2022  PCP: Karenann Cai, NP   Chief Complaint: weakness   HPI: Christine Jacobson is a 67 y.o. female with medical history significant for hypertrophic cardiomyopathy, chronic diastolic congestive heart failure, hypertension, obesity, atrial fibrillation on apixaban recent hospital stay discharged on 07/21/2022 for treatment of lower extremity cellulitis who is being admitted to the hospital with weakness and found to have acute blood loss anemia.  Patient states that she completed her course of oral antibiotics after discharge from the hospital, and her legs got better.  She lives alone and has home health care, wound nurse has been coming weekly to change her dressings.  Patient states that she has been feeling quite weak and has been getting progressively so, since discharge from the hospital.  Yesterday, her home health nurse told her that her blood pressure was quite low.  Today, the patient was getting ready for a scheduled primary care follow-up, she felt very dizzy, continued to feel very weak, and so called EMS.  On further discussion, patient states that she has continued to take her apixaban, does not examine her stools so does not know if they have been dark or bloody.  She denies abdominal pain, though she has had some vague nausea.  She denies any fevers or chills, or any prior history of GI bleeding.  ED Course: Per EMS, her initial blood pressure was 90/60, she was given 400 cc fluid bolus.  On arrival in the emergency department, blood pressure was 83/57, heart rate 61, saturating well on room air, and afebrile.  Lab work reveals hemoglobin 5.4, was last 7.9 on day of discharge from the hospital 7/4.  WBC and platelets normal, troponin 15, 13.  BNP elevated 1630.3, creatinine elevated 1.90 from baseline 1.01.  Rectal exam was performed, fecal occult blood test was positive.  She was given a dose of IV Protonix,  type and screen, and 2 unit blood transfusion was ordered.  Due to elevated BNP, she was given a dose of 40 mg IV Lasix.  Afterwards, hospitalist was contacted for admission.  Review of Systems: Please see HPI for pertinent positives and negatives. A complete 10 system review of systems are otherwise negative.  Past Medical History:  Diagnosis Date   Chest pain 11/22/2010   2D STRESS ECHO - EF 60%, peak stress EF 80%, normal, no evidence for stress-induced ischemia   HOCM (hypertrophic obstructive cardiomyopathy) (HCC) 06/21/2011   2D ECHO - EF >55%, normal   HTN (hypertension)    Hyperprolactinemia (HCC)    dx in her 18, took meds, self d/c a while back   Liver function test abnormality    normal when repeated   Mild hyperlipidemia    Obesity    Palpitations    negative stress echo in November 2012 with normal LV function; mild LVH, proximal septal thickening with narrow LVOT and mild gradient; mild MR and TR; Cardionet showed PACs in November 2012   Pyoderma gangrenosa    Shortness of breath 07/11/2011   MET TEST   Past Surgical History:  Procedure Laterality Date   CARDIOVERSION N/A 02/01/2022   Procedure: CARDIOVERSION;  Surgeon: Thomasene Ripple, DO;  [facility];  Service: Cardiovascular;  Laterality: N/A;   RIGHT HEART CATH N/A 10/15/2021   Procedure: RIGHT HEART CATH;  Surgeon: Elder Negus, MD;  Location: [facility];  Service: Cardiovascular;  Laterality: N/A;   SKIN GRAFT  2006  porcine, R leg    Social History:  reports that she has quit smoking. Her smoking use included cigarettes. She has a 4 pack-year smoking history. She has never used smokeless tobacco. She reports that she does not currently use alcohol after a past usage of about 1.0 standard drink of alcohol per week. She reports that she does not use drugs.   Allergies  Allergen Reactions   Ativan [Lorazepam] Other (See Comments)    "Is not effective"   Pork-Derived Products Other (See  Comments)    Religious preference   Tylenol [Acetaminophen] Nausea Only and Other (See Comments)    Upsets the stomach    Family History  Problem Relation Age of Onset   Heart disease Mother        endocarditis   Ovarian cancer Mother    Emphysema Father    Coronary artery disease Other        F in his 40   Diabetes Other        GP   Cancer Neg Hx      Prior to Admission medications   Medication Sig Start Date End Date Taking? Authorizing Provider  apixaban (ELIQUIS) 5 MG TABS tablet Take 1 tablet (5 mg total) by mouth 2 (two) times daily. 02/23/22  Yes Tobb, Kardie, DO  ascorbic acid (VITAMIN C) 500 MG tablet Take 1 tablet (500 mg total) by mouth daily for 30 days 06/10/22  Yes Leroy Sea, MD  furosemide (LASIX) 40 MG tablet Take 20 mg by mouth See admin instructions. Take 20 mg by mouth one to two times a day   Yes [provider]  Ibuprofen 200 MG CAPS Take 200-400 mg by mouth every 6 (six) hours as needed (for pain or headaches).   Yes [provider]  metoprolol succinate (TOPROL-XL) 100 MG 24 hr tablet TAKE ONE TABLET BY MOUTH TWICE A DAY WITH OR IMMEDIATELY FOLLOWING A MEAL Patient taking differently: Take 100 mg by mouth 2 (two) times daily. 03/02/22  Yes Tobb, Kardie, DO  mineral oil-hydrophilic petrolatum (AQUAPHOR) ointment Apply topically as needed for dry skin. Use with Unna boots please at the back of the wounds on the lower extremities when the Unna boots are changed 07/21/22  Yes Rhetta Mura, MD  potassium chloride SA (KLOR-CON M) 20 MEQ tablet Take 2 tablets (40 mEq total) by mouth 2 (two) times daily. Patient taking differently: Take 20 mEq by mouth 2 (two) times daily. 07/21/22  Yes Rhetta Mura, MD  Zinc Sulfate 220 (50 Zn) MG TABS Take 1 tablet (220 mg total) by mouth daily for 30 days 06/10/22  Yes Leroy Sea, MD  cephALEXin (KEFLEX) 500 MG capsule Take 1 capsule (500 mg total) by mouth every 12 (twelve) hours. Patient not  taking: Reported on 08/04/2022 07/21/22   Rhetta Mura, MD  hydrOXYzine (ATARAX) 10 MG tablet Take 1 tablet (10 mg total) by mouth 3 (three) times daily as needed for anxiety. Patient not taking: Reported on 08/04/2022 07/21/22   Rhetta Mura, MD  midodrine (PROAMATINE) 5 MG tablet Take 1 tablet (5 mg total) by mouth 2 (two) times daily with a meal. 06/09/22   Leroy Sea, MD  polyethylene glycol powder (GLYCOLAX/MIRALAX) 17 GM/SCOOP powder Take 1 capful (17 g) by mouth daily as needed for mild constipation. Patient not taking: Reported on 08/04/2022 06/09/22   Leroy Sea, MD    Physical Exam: BP (!) 108/53   Pulse (!) 56   Temp 97.7  F (36.5 C) (Oral)   Resp 13   SpO2 93%   General:  Alert, oriented, slightly anxious but pleasant, in no acute distress, looks pale, and vaguely uncomfortable Eyes: EOMI, clear conjuctivae, white sclerea Neck: supple, no masses, trachea mildline  Cardiovascular: RRR, no murmurs or rubs, she has 1+ pitting bilateral lower extremity edema, which appears significantly improved from her prior hospital stay Respiratory: clear to auscultation bilaterally, no wheezes, no crackles  Abdomen: soft, nontender, nondistended, normal bowel tones heard  Skin: dry, no rashes, she has chronic appearing hemosiderin deposits in the bilateral lower extremities consistent with venous stasis Musculoskeletal: no joint effusions, normal range of motion  Psychiatric: appropriate affect, normal speech  Neurologic: extraocular muscles intact, clear speech, moving all extremities with intact sensorium          Labs on Admission:  Basic Metabolic Panel: Recent Labs  Lab 08/04/22 1430  NA 139  K 4.5  CL 109  CO2 20*  GLUCOSE 110*  BUN 49*  CREATININE 1.90*  CALCIUM 9.3   Liver Function Tests: Recent Labs  Lab 08/04/22 1430  AST 15  ALT 15  ALKPHOS 153*  BILITOT 1.0  PROT 6.2*  ALBUMIN 3.3*   No results for input(s): "LIPASE", "AMYLASE" in the  last 168 hours. No results for input(s): "AMMONIA" in the last 168 hours. CBC: Recent Labs  Lab 08/04/22 1430  WBC 9.7  NEUTROABS 7.7  HGB 5.4*  HCT 18.9*  MCV 99.5  PLT 321   Cardiac Enzymes: No results for input(s): "CKTOTAL", "CKMB", "CKMBINDEX", "TROPONINI" in the last 168 hours.  BNP (last 3 results) Recent Labs    07/16/22 0806 07/18/22 0339 08/04/22 1430  BNP 1,055.7* 848.2* 1,630.3*   ProBNP (last 3 results) No results for input(s): "PROBNP" in the last 8760 hours.  CBG: No results for input(s): "GLUCAP" in the last 168 hours.  Radiological Exams on Admission: DG Chest Port 1 View  Result Date: 08/04/2022 CLINICAL DATA:  Chest pain. EXAM: PORTABLE CHEST 1 VIEW COMPARISON:  One-view chest x-ray 07/16/2022 FINDINGS: The heart is enlarged. Mild pulmonary vascular congestion is present without frank edema. No focal airspace disease present. No effusions are present. The visualized soft tissues bony thorax are normal. IMPRESSION: Cardiomegaly and mild pulmonary vascular congestion without frank edema. Electronically Signed   By: Marin Roberts M.D.   On: 08/04/2022 14:48    Assessment/Plan This is a chronically ill 67 year old female with a history of hypertension, hyperlipidemia, obesity, pyoderma gangrenosum, atrial fibrillation on apixaban admitted to the hospital with hypotension and acute blood loss anemia due to suspected GI bleed.  Acute blood loss anemia-due to suspected GI bleed given apixaban use, positive FOBT.  Unclear from patient history of this is upper or lower GI bleed.  This is also causing significant hypotension. -Inpatient admission to SDU -Hold blood thinners -Keep n.p.o. except for ice chips and sips with meds -Transfused 2 units PRBC now, ordered by ER physician -Continue IV PPI daily -ER provider has consulted GI Dr. Lorenso Quarry  Hypotension-likely due to GI bleeding and relative dehydration in the setting of continued diuretic. Was given  dose of Lasix in ER, and later 500cc bolus ordered by me and BP improved. -Hold home antihypertensives -Hold further diuretics -Continue midodrine 5 mg p.o. twice daily  Hypertension-holding metoprolol as above  Acute renal failure-likely due to relative dehydration, GI bleeding and hypotension, also due to continued diuretic -Hold further diuretics and nephrotoxins -Avoid hypotension -Follow renal function with daily labs  Chronic diastolic congestive heart failure-she has elevated BNP, but without evidence of acute heart failure on physical exam.  She is saturating well on room air, and has improved lower extremity edema. -Continue metoprolol once blood pressure stabilizes  Atrial fibrillation-patient on metoprolol and Eliquis at home, currently she is in normal sinus rhythm -Continue rate control medication once blood pressure allows -Holding Eliquis due to GI bleed  Bilateral lower extremity cellulitis-she was recently treated with IV ceftriaxone, and completed outpatient therapy with oral antibiotics.  Her cellulitis has resolved.  Right lower extremity wound-does not look actively infected, will consult wound care  Class II obesity-most recent BMI 36.56 on 6/26, complicating all aspects of care  DVT prophylaxis: SCDs    Code Status: DNR-discussed in detail with the patient at the time of admission, and confirmed DNR status  Consults called: EDP has consulted gastroenterology Dr. Lorenso Quarry  Admission status: The appropriate patient status for this patient is INPATIENT. Inpatient status is judged to be reasonable and necessary in order to provide the required intensity of service to ensure the patient's safety. The patient's presenting symptoms, physical exam findings, and initial radiographic and laboratory data in the context of their chronic comorbidities is felt to place them at high risk for further clinical deterioration. Furthermore, it is not anticipated that the patient  will be medically stable for discharge from the hospital within 2 midnights of admission.    I certify that at the point of admission it is my clinical judgment that the patient will require inpatient hospital care spanning beyond 2 midnights from the point of admission due to high intensity of service, high risk for further deterioration and high frequency of surveillance required  Time spent: 56 minutes  Christine Jacobson Sharlette Dense MD [facility] Pager [phone]  If 7PM-7AM, please contact night-coverage [email or web address] Password [email or web address]  08/04/2022, 4:40 PM

## 2022-08-04 NOTE — ED Notes (Signed)
ED TO INPATIENT HANDOFF REPORT  ED Nurse Name and Phone #: Denisse/Keenan Dimitrov  S Name/Age/Gender Christine Jacobson 67 y.o. female Room/Bed: WA15/WA15  Code Status   Code Status: DNR  Home/SNF/Other Home Patient oriented to: self, place, time, and situation Is this baseline? Yes   Triage Complete: Triage complete  Chief Complaint Acute blood loss anemia [D62]  Triage Note No notes on file   Allergies Allergies  Allergen Reactions   Ativan [Lorazepam] Other (See Comments)    "Is not effective"   Pork-Derived Products Other (See Comments)    Religious preference   Tylenol [Acetaminophen] Nausea Only and Other (See Comments)    Upsets the stomach    Level of Care/Admitting Diagnosis ED Disposition     ED Disposition  Admit   Condition  --   Comment  Hospital Area: [facility] [[id number]]  Level of Care: Stepdown [14]  Admit to SDU based on following criteria: Hemodynamic compromise or significant risk of instability:  Patient requiring short term acute titration and management of vasoactive drips, and invasive monitoring (i.e., CVP and Arterial line).  May admit patient to [facility] or [facility] if equivalent level of care is available:: Yes  Covid Evaluation: Asymptomatic - no recent exposure (last 10 days) testing not required  Diagnosis: Acute blood loss anemia [469629]  Admitting Physician: Maryln Gottron [[id number]]  Attending Physician: Kirby Crigler, MIR [email or web address] [[id number]]  Certification:: I certify this patient will need inpatient services for at least 2 midnights  Estimated Length of Stay: 3          B Medical/Surgery History Past Medical History:  Diagnosis Date   Chest pain 11/22/2010   2D STRESS ECHO - EF 60%, peak stress EF 80%, normal, no evidence for stress-induced ischemia   HOCM (hypertrophic obstructive cardiomyopathy) (HCC) 06/21/2011   2D ECHO - EF >55%, normal   HTN (hypertension)    Hyperprolactinemia (HCC)    dx in her 18, took  meds, self d/c a while back   Liver function test abnormality    normal when repeated   Mild hyperlipidemia    Obesity    Palpitations    negative stress echo in November 2012 with normal LV function; mild LVH, proximal septal thickening with narrow LVOT and mild gradient; mild MR and TR; Cardionet showed PACs in November 2012   Pyoderma gangrenosa    Shortness of breath 07/11/2011   MET TEST   Past Surgical History:  Procedure Laterality Date   CARDIOVERSION N/A 02/01/2022   Procedure: CARDIOVERSION;  Surgeon: Thomasene Ripple, DO;  Location: [facility];  Service: Cardiovascular;  Laterality: N/A;   RIGHT HEART CATH N/A 10/15/2021   Procedure: RIGHT HEART CATH;  Surgeon: Elder Negus, MD;  Location: [facility] LAB;  Service: Cardiovascular;  Laterality: N/A;   SKIN GRAFT  2006   porcine, R leg     A IV Location/Drains/Wounds Patient Lines/Drains/Airways Status     Active Line/Drains/Airways     Name Placement date Placement time Site Days   Peripheral IV 08/04/22 Right Antecubital 08/04/22  1553  Antecubital  less than 1   Pressure Injury 06/06/22 Buttocks Posterior;Right Stage 2 -  Partial thickness loss of dermis presenting as a shallow open injury with a red, pink wound bed without slough. 06/06/22  1930  -- 59   Wound / Incision (Open or Dehisced) 10/13/21 Other (Comment) Pretibial Left;Posterior red, difuse, uniform rash-like appearance with purulence visible on the anterior lateral left leg, full thickness  wound on left posterior calf 10/13/21  1950  Pretibial  295   Wound / Incision (Open or Dehisced) 06/02/22 Pretibial Left;Lateral 06/02/22  1800  Pretibial  63   Wound / Incision (Open or Dehisced) 06/02/22 Non-pressure wound Tibial Posterior;Right open wound with sloughing of yellow/green area 06/02/22  1900  Tibial  63            Intake/Output Last 24 hours No intake or output data in the 24 hours ending 08/04/22 1805  Labs/Imaging Results for orders  placed or performed during the hospital encounter of 08/04/22 (from the past 48 hour(s))  Comprehensive metabolic panel     Status: Abnormal   Collection Time: 08/04/22  2:30 PM  Result Value Ref Range   Sodium 139 135 - 145 mmol/L   Potassium 4.5 3.5 - 5.1 mmol/L   Chloride 109 98 - 111 mmol/L   CO2 20 (L) 22 - 32 mmol/L   Glucose, Bld 110 (H) 70 - 99 mg/dL    Comment: Glucose reference range applies only to samples taken after fasting for at least 8 hours.   BUN 49 (H) 8 - 23 mg/dL   Creatinine, Ser 4.09 (H) 0.44 - 1.00 mg/dL   Calcium 9.3 8.9 - 81.1 mg/dL   Total Protein 6.2 (L) 6.5 - 8.1 g/dL   Albumin 3.3 (L) 3.5 - 5.0 g/dL   AST 15 15 - 41 U/L   ALT 15 0 - 44 U/L   Alkaline Phosphatase 153 (H) 38 - 126 U/L   Total Bilirubin 1.0 0.3 - 1.2 mg/dL   GFR, Estimated 28 (L) >60 mL/min    Comment: (NOTE) Calculated using the CKD-EPI Creatinine Equation (2021)    Anion gap 10 5 - 15    Comment: Performed at [facility], [address] [place], [place] [address]  CBC with Differential     Status: Abnormal   Collection Time: 08/04/22  2:30 PM  Result Value Ref Range   WBC 9.7 4.0 - 10.5 K/uL   RBC 1.90 (L) 3.87 - 5.11 MIL/uL   Hemoglobin 5.4 (LL) 12.0 - 15.0 g/dL    Comment: REPEATED TO VERIFY THIS CRITICAL RESULT HAS VERIFIED AND BEEN CALLED TO C.KISER, RN BY NATHAN THOMPSON ON 07 18 2024 AT 1545, AND HAS BEEN READ BACK. CRITICAL RESULT VERIFIED    HCT 18.9 (L) 36.0 - 46.0 %   MCV 99.5 80.0 - 100.0 fL   MCH 28.4 26.0 - 34.0 pg   MCHC 28.6 (L) 30.0 - 36.0 g/dL   RDW 29.5 (H) 62.1 - 30.8 %   Platelets 321 150 - 400 K/uL   nRBC 0.5 (H) 0.0 - 0.2 %   Neutrophils Relative % 79 %   Neutro Abs 7.7 1.7 - 7.7 K/uL   Lymphocytes Relative 12 %   Lymphs Abs 1.1 0.7 - 4.0 K/uL   Monocytes Relative 5 %   Monocytes Absolute 0.5 0.1 - 1.0 K/uL   Eosinophils Relative 2 %   Eosinophils Absolute 0.2 0.0 - 0.5 K/uL   Basophils Relative 1 %   Basophils Absolute 0.1 0.0 -  0.1 K/uL   Immature Granulocytes 1 %   Abs Immature Granulocytes 0.06 0.00 - 0.07 K/uL    Comment: Performed at [facility], [address], [place], [place] [address]  D-dimer, quantitative     Status: None   Collection Time: 08/04/22  2:30 PM  Result Value Ref Range   D-Dimer, Quant 0.42 0.00 -  0.50 ug/mL-FEU    Comment: (NOTE) At the manufacturer cut-off value of 0.5 g/mL FEU, this assay has a negative predictive value of 95-100%.This assay is intended for use in conjunction with a clinical pretest probability (PTP) assessment model to exclude pulmonary embolism (PE) and deep venous thrombosis (DVT) in outpatients suspected of PE or DVT. Results should be correlated with clinical presentation. Performed at [facility], [address] [place], [place] [address]   Protime-INR     Status: Abnormal   Collection Time: 08/04/22  2:30 PM  Result Value Ref Range   Prothrombin Time 29.1 (H) 11.4 - 15.2 seconds   INR 2.7 (H) 0.8 - 1.2    Comment: (NOTE) INR goal varies based on device and disease states. Performed at [facility], [address], [place], [place] [address]   Troponin I (High Sensitivity)     Status: None   Collection Time: 08/04/22  2:30 PM  Result Value Ref Range   Troponin I (High Sensitivity) 15 <18 ng/L    Comment: (NOTE) Elevated high sensitivity troponin I (hsTnI) values and significant  changes across serial measurements may suggest ACS but many other  chronic and acute conditions are known to elevate hsTnI results.  Refer to the "Links" section for chest pain algorithms and additional  guidance. Performed at [facility], [address], [place], [place] [address]   Brain natriuretic peptide     Status: Abnormal   Collection Time: 08/04/22  2:30 PM  Result Value Ref Range   B Natriuretic Peptide 1,630.3 (H) 0.0 - 100.0 pg/mL    Comment: Performed at [facility], [address], [place], [place] [address]  Type and screen [facility]     Status: None (Preliminary result)   Collection Time: 08/04/22  2:30 PM  Result Value Ref Range   ABO/RH(D) A POS    Antibody Screen NEG    Sample Expiration 08/07/2022,2359    Unit Number [id number]    Blood Component Type RBC LR PHER1    Unit division 00    Status of Unit ALLOCATED    Transfusion Status OK TO TRANSFUSE    Crossmatch Result      Compatible Performed at [facility], [address], [place], [place] [address]    Unit Number [id number]    Blood Component Type RED CELLS,LR    Unit division 00    Status of Unit ALLOCATED    Transfusion Status OK TO TRANSFUSE    Crossmatch Result Compatible   Troponin I (High Sensitivity)     Status: None   Collection Time: 08/04/22  3:30 PM  Result Value Ref Range   Troponin I (High Sensitivity) 13 <18 ng/L    Comment: (NOTE) Elevated high sensitivity troponin I (hsTnI) values and significant  changes across serial measurements may suggest ACS but many other  chronic and acute conditions are known to elevate hsTnI results.  Refer to the "Links" section for chest pain algorithms and additional  guidance. Performed at [facility], [address], [place], [place] [address]   POC occult blood, ED     Status: Abnormal   Collection Time: 08/04/22  4:29 PM  Result Value Ref Range   Fecal Occult Bld POSITIVE (A) NEGATIVE  Prepare RBC (crossmatch)     Status: None   Collection Time: 08/04/22  4:30 PM  Result Value Ref Range   Order Confirmation  ORDER PROCESSED BY BLOOD BANK Performed at [facility], [address], [place], [place] [address]    DG Chest Port 1 View  Result Date: 08/04/2022 CLINICAL DATA:  Chest pain. EXAM: PORTABLE CHEST 1 VIEW COMPARISON:  One-view chest x-ray 07/16/2022 FINDINGS: The heart is enlarged. Mild pulmonary vascular congestion  is present without frank edema. No focal airspace disease present. No effusions are present. The visualized soft tissues bony thorax are normal. IMPRESSION: Cardiomegaly and mild pulmonary vascular congestion without frank edema. Electronically Signed   By: Marin Roberts M.D.   On: 08/04/2022 14:48    Pending Labs Unresulted Labs (From admission, onward)     Start     Ordered   08/05/22 0500  Comprehensive metabolic panel  Tomorrow morning,   R        08/04/22 1638   08/05/22 0500  CBC  Tomorrow morning,   R        08/04/22 1638   08/04/22 1636  Hemoglobin  Now then every 8 hours,   R      08/04/22 1638   08/04/22 1600  Occult blood card to lab, stool Provider will collect  Once,   URGENT       Question:  Specimen to be collected by:  Answer:  Provider will collect   08/04/22 1559            Vitals/Pain Today's Vitals   08/04/22 1730 08/04/22 1735 08/04/22 1745 08/04/22 1800  BP: (!) 88/54  94/62 107/60  Pulse: (!) 59  (!) 59 63  Resp: (!) 29     Temp:  98.5 F (36.9 C)    TempSrc:  Oral    SpO2: 92%  93% 96%  PainSc:        Isolation Precautions No active isolations  Medications Medications  traMADol (ULTRAM) tablet 50 mg (has no administration in time range)  traZODone (DESYREL) tablet 25 mg (has no administration in time range)  ondansetron (ZOFRAN) tablet 4 mg (has no administration in time range)    Or  ondansetron (ZOFRAN) injection 4 mg (has no administration in time range)  albuterol (PROVENTIL) (2.5 MG/3ML) 0.083% nebulizer solution 2.5 mg (has no administration in time range)  hydrOXYzine (ATARAX) tablet 10 mg (has no administration in time range)  midodrine (PROAMATINE) tablet 5 mg (5 mg Oral Given 08/04/22 1703)  pantoprazole (PROTONIX) injection 40 mg (has no administration in time range)  pantoprazole (PROTONIX) injection 40 mg (40 mg Intravenous Given 08/04/22 1627)  0.9 %  sodium chloride infusion (Manually program via Guardrails IV Fluids) (  Intravenous New Bag/Given 08/04/22 1637)  furosemide (LASIX) injection 40 mg (40 mg Intravenous Given 08/04/22 1627)  sodium chloride 0.9 % bolus 500 mL (500 mLs Intravenous New Bag/Given 08/04/22 1753)    Mobility walks with device     Focused Assessments    R Recommendations: See Admitting Provider Note  Report given to:   Additional Notes:

## 2022-08-04 NOTE — ED Provider Notes (Signed)
[facility] AT [facility] Provider Note  CSN: [id number] Arrival date & time: 08/04/22 1303  Chief Complaint(s) Weakness  HPI Christine Jacobson is a 67 y.o. female history of hypertension, CHF, paroxysmal atrial fibrillation on Eliquis presenting to the emergency department with weakness and fatigue.  Patient reports weakness and fatigue for the past few days.  She has not noticed any shortness of breath but her home health physical therapist and ambulance to the hospital both noted that she looks short of breath.  She reports she has been compliant with all of her home medications.  No fevers or chills.  No chest pain.  No cough, painful urination, abdominal pain.  Has not noticed any black stools.   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   2D ECHO - EF >55%, normal   HTN (hypertension)    Hyperprolactinemia (HCC)    dx in her 18, took meds, self d/c a while back   Liver function test abnormality    normal when repeated   Mild hyperlipidemia    Obesity    Palpitations    negative stress echo in November 2012 with normal LV function; mild LVH, proximal septal thickening with narrow LVOT and mild gradient; mild MR and TR; Cardionet showed PACs in November 2012   Pyoderma gangrenosa    Shortness of breath 07/11/2011   MET TEST   Patient Active Problem List   Diagnosis Date Noted   Acute blood loss anemia 08/04/2022   Cellulitis of both lower extremities 07/13/2022   Hypocarbia 07/13/2022   Normocytic anemia 07/13/2022   Cellulitis 06/02/2022   Sepsis due to cellulitis (HCC) 06/02/2022   ARF (acute renal failure) (HCC) 06/02/2022   Hyponatremia 06/02/2022   Dehydration 06/02/2022   Metabolic acidosis 06/02/2022   Other secondary pulmonary hypertension  (HCC)    Cellulitis and abscess of left lower extremity 10/13/2021   Persistent atrial fibrillation (HCC) 07/28/2021   Obesity    Liver function test abnormality    Primary hypertension    Chronic diastolic heart failure (HCC) 12/15/2016   HOCM (hypertrophic obstructive cardiomyopathy) (HCC) 01/22/2016   Lower extremity edema 09/17/2014   History of syncope 12/05/2013   Syncope 12/05/2013   Pneumonia 11/23/2012   Shortness of breath 07/11/2011   Hypertension 11/23/2010   Chest pain 10/18/2010   Murmur 10/18/2010   Palpitations 09/23/2010   Mild hyperlipidemia    Elevated BP    Hyperprolactinemia (HCC)    Pyoderma gangrenosa    Home Medication(s) Prior to Admission medications   Medication Sig Start Date End Date Taking? Authorizing Provider  apixaban (ELIQUIS) 5 MG TABS tablet Take 1 tablet (5 mg total) by mouth 2 (two) times daily. 02/23/22  Yes Tobb, Kardie, DO  ascorbic acid (VITAMIN C) 500 MG tablet Take 1 tablet (500 mg total) by mouth daily for 30 days 06/10/22  Yes Leroy Sea, MD  furosemide (LASIX) 40 MG tablet Take 20 mg by mouth See admin instructions. Take 20 mg by mouth one to two times a day   Yes [provider]  Ibuprofen 200 MG CAPS Take 200-400 mg by mouth every 6 (six) hours as needed (for pain or headaches).   Yes [provider]  metoprolol succinate (TOPROL-XL) 100 MG 24 hr tablet TAKE ONE TABLET  BY MOUTH TWICE A DAY WITH OR IMMEDIATELY FOLLOWING A MEAL Patient taking differently: Take 100 mg by mouth 2 (two) times daily. 03/02/22  Yes Tobb, Kardie, DO  mineral oil-hydrophilic petrolatum (AQUAPHOR) ointment Apply topically as needed for dry skin. Use with Unna boots please at the back of the wounds on the lower extremities when the Unna boots are changed 07/21/22  Yes Rhetta Mura, MD  potassium chloride SA (KLOR-CON M) 20 MEQ tablet Take 2 tablets (40 mEq total) by mouth 2 (two) times daily. Patient taking differently: Take 20 mEq by  mouth 2 (two) times daily. 07/21/22  Yes Rhetta Mura, MD  Zinc Sulfate 220 (50 Zn) MG TABS Take 1 tablet (220 mg total) by mouth daily for 30 days 06/10/22  Yes Leroy Sea, MD  cephALEXin (KEFLEX) 500 MG capsule Take 1 capsule (500 mg total) by mouth every 12 (twelve) hours. Patient not taking: Reported on 08/04/2022 07/21/22   Rhetta Mura, MD  hydrOXYzine (ATARAX) 10 MG tablet Take 1 tablet (10 mg total) by mouth 3 (three) times daily as needed for anxiety. Patient not taking: Reported on 08/04/2022 07/21/22   Rhetta Mura, MD  midodrine (PROAMATINE) 5 MG tablet Take 1 tablet (5 mg total) by mouth 2 (two) times daily with a meal. 06/09/22   Leroy Sea, MD  polyethylene glycol powder (GLYCOLAX/MIRALAX) 17 GM/SCOOP powder Take 1 capful (17 g) by mouth daily as needed for mild constipation. Patient not taking: Reported on 08/04/2022 06/09/22   Leroy Sea, MD                                                                                                                                    Past Surgical History Past Surgical History:  Procedure Laterality Date   CARDIOVERSION N/A 02/01/2022   Procedure: CARDIOVERSION;  Surgeon: Thomasene Ripple, DO;  Location: [facility] ENDOSCOPY;  Service: Cardiovascular;  Laterality: N/A;   RIGHT HEART CATH N/A 10/15/2021   Procedure: RIGHT HEART CATH;  Surgeon: Elder Negus, MD;  Location: [facility] INVASIVE CV LAB;  Service: Cardiovascular;  Laterality: N/A;   SKIN GRAFT  2006   porcine, R leg   Family History Family History  Problem Relation Age of Onset   Heart disease Mother        endocarditis   Ovarian cancer Mother    Emphysema Father    Coronary artery disease Other        F in his 4   Diabetes Other        GP   Cancer Neg Hx     Social History Social History   Tobacco Use   Smoking status: Former    Current packs/day: 1.00    Average packs/day: 1 pack/day for 4.0 years (4.0 ttl pk-yrs)    Types: Cigarettes    Smokeless tobacco: Never  Vaping Use   Vaping status: Never Used  Substance Use Topics  Alcohol use: Not Currently    Alcohol/week: 1.0 standard drink of alcohol    Types: 1 Glasses of wine per week    Comment: "I do have my wine" every day, has cut down from 2 bottles at a time, currently 2-3 glasses   Drug use: No   Allergies Ativan [lorazepam], Pork-derived products, and Tylenol [acetaminophen]  Review of Systems Review of Systems  All other systems reviewed and are negative.   Physical Exam Vital Signs  I have reviewed the triage vital signs BP (!) 108/53   Pulse (!) 56   Temp 97.7 F (36.5 C) (Oral)   Resp 13   SpO2 93%  Physical Exam Vitals and nursing note reviewed.  Constitutional:      General: She is not in acute distress.    Appearance: She is well-developed.  HENT:     Head: Normocephalic and atraumatic.     Mouth/Throat:     Mouth: Mucous membranes are moist.  Eyes:     Pupils: Pupils are equal, round, and reactive to light.  Cardiovascular:     Rate and Rhythm: Normal rate and regular rhythm.     Heart sounds: No murmur heard. Pulmonary:     Effort: No respiratory distress.     Breath sounds: Normal breath sounds.     Comments: Mild tachypnea with increased work of breathing Abdominal:     General: Abdomen is flat.     Palpations: Abdomen is soft.     Tenderness: There is no abdominal tenderness.  Genitourinary:    Comments: Guiaiac + black stool Musculoskeletal:        General: No tenderness.     Right lower leg: Edema present.     Left lower leg: Edema present.     Comments: Chronic wounds to the posterior right lower extremity with no surrounding erythema or warmth to suggest acute infection  Skin:    General: Skin is warm and dry.     Coloration: Skin is pale.  Neurological:     General: No focal deficit present.     Mental Status: She is alert. Mental status is at baseline.  Psychiatric:        Mood and Affect: Mood normal.         Behavior: Behavior normal.     ED Results and Treatments Labs (all labs ordered are listed, but only abnormal results are displayed) Labs Reviewed  COMPREHENSIVE METABOLIC PANEL - Abnormal; Notable for the following components:      Result Value   CO2 20 (*)    Glucose, Bld 110 (*)    BUN 49 (*)    Creatinine, Ser 1.90 (*)    Total Protein 6.2 (*)    Albumin 3.3 (*)    Alkaline Phosphatase 153 (*)    GFR, Estimated 28 (*)    All other components within normal limits  CBC WITH DIFFERENTIAL/PLATELET - Abnormal; Notable for the following components:   RBC 1.90 (*)    Hemoglobin 5.4 (*)    HCT 18.9 (*)    MCHC 28.6 (*)    RDW 17.3 (*)    nRBC 0.5 (*)    All other components within normal limits  PROTIME-INR - Abnormal; Notable for the following components:   Prothrombin Time 29.1 (*)    INR 2.7 (*)    All other components within normal limits  BRAIN NATRIURETIC PEPTIDE - Abnormal; Notable for the following components:   B Natriuretic Peptide 1,630.3 (*)  All other components within normal limits  POC OCCULT BLOOD, ED - Abnormal; Notable for the following components:   Fecal Occult Bld POSITIVE (*)    All other components within normal limits  D-DIMER, QUANTITATIVE  OCCULT BLOOD X 1 CARD TO LAB, STOOL  HEMOGLOBIN  HEMOGLOBIN  COMPREHENSIVE METABOLIC PANEL  CBC  TYPE AND SCREEN  PREPARE RBC (CROSSMATCH)  TROPONIN I (HIGH SENSITIVITY)  TROPONIN I (HIGH SENSITIVITY)                                                                                                                          Radiology DG Chest Port 1 View  Result Date: 08/04/2022 CLINICAL DATA:  Chest pain. EXAM: PORTABLE CHEST 1 VIEW COMPARISON:  One-view chest x-ray 07/16/2022 FINDINGS: The heart is enlarged. Mild pulmonary vascular congestion is present without frank edema. No focal airspace disease present. No effusions are present. The visualized soft tissues bony thorax are normal. IMPRESSION:  Cardiomegaly and mild pulmonary vascular congestion without frank edema. Electronically Signed   By: Marin Roberts M.D.   On: 08/04/2022 14:48    Pertinent labs & imaging results that were available during my care of the patient were reviewed by me and considered in my medical decision making (see MDM for details).  Medications Ordered in ED Medications  traMADol (ULTRAM) tablet 50 mg (has no administration in time range)  traZODone (DESYREL) tablet 25 mg (has no administration in time range)  ondansetron (ZOFRAN) tablet 4 mg (has no administration in time range)    Or  ondansetron (ZOFRAN) injection 4 mg (has no administration in time range)  albuterol (PROVENTIL) (2.5 MG/3ML) 0.083% nebulizer solution 2.5 mg (has no administration in time range)  hydrOXYzine (ATARAX) tablet 10 mg (has no administration in time range)  midodrine (PROAMATINE) tablet 5 mg (has no administration in time range)  pantoprazole (PROTONIX) injection 40 mg (40 mg Intravenous Given 08/04/22 1627)  0.9 %  sodium chloride infusion (Manually program via Guardrails IV Fluids) ( Intravenous New Bag/Given 08/04/22 1637)  furosemide (LASIX) injection 40 mg (40 mg Intravenous Given 08/04/22 1627)                                                                                                                                     Procedures .Critical Care  Performed by: Lonell Grandchild, MD Authorized by: Lonell Grandchild, MD  Critical care provider statement:    Critical care time (minutes):  30   Critical care was necessary to treat or prevent imminent or life-threatening deterioration of the following conditions:  Cardiac failure   Critical care was time spent personally by me on the following activities:  Development of treatment plan with patient or surrogate, discussions with consultants, evaluation of patient's response to treatment, examination of patient, ordering and review of laboratory studies, ordering  and review of radiographic studies, ordering and performing treatments and interventions, pulse oximetry, re-evaluation of patient's condition and review of old charts   (including critical care time)  Medical Decision Making / ED Course   MDM:  67 year old female presenting to the emergency department generalized weakness.  Examination notable for pale appearance, bilateral lower extremity swelling.  She has a chronic wound on the right lower extremity which does not appear acutely infected.  Rectal exam with guaiac positive stool.  Labs notable for hemoglobin of 5.4, dropped from recent hospitalization.  Patient consented to receive blood transfusion.  Does appear mildly volume overloaded so we will give dose of Lasix prior to transfusion.  She is on Eliquis and her occult blood testing is positive, suspect slow GI bleed.  Discussed with hospitalist as well as GI physician Dr. Lorenso Quarry who will consult.  Lower concern for underlying infectious process, no fevers or leukocytosis.  Chest x-ray without signs of pneumonia.  Also considered PE with her weakness but D-dimer is negative. Patient will be admitted for further workup.        Additional history obtained: -Additional history obtained from ems -External records from outside source obtained and reviewed including: Chart review including previous notes, labs, imaging, consultation notes including recent d/c summary   Lab Tests: -I ordered, reviewed, and interpreted labs.   The pertinent results include:   Labs Reviewed  COMPREHENSIVE METABOLIC PANEL - Abnormal; Notable for the following components:      Result Value   CO2 20 (*)    Glucose, Bld 110 (*)    BUN 49 (*)    Creatinine, Ser 1.90 (*)    Total Protein 6.2 (*)    Albumin 3.3 (*)    Alkaline Phosphatase 153 (*)    GFR, Estimated 28 (*)    All other components within normal limits  CBC WITH DIFFERENTIAL/PLATELET - Abnormal; Notable for the following components:    RBC 1.90 (*)    Hemoglobin 5.4 (*)    HCT 18.9 (*)    MCHC 28.6 (*)    RDW 17.3 (*)    nRBC 0.5 (*)    All other components within normal limits  PROTIME-INR - Abnormal; Notable for the following components:   Prothrombin Time 29.1 (*)    INR 2.7 (*)    All other components within normal limits  BRAIN NATRIURETIC PEPTIDE - Abnormal; Notable for the following components:   B Natriuretic Peptide 1,630.3 (*)    All other components within normal limits  POC OCCULT BLOOD, ED - Abnormal; Notable for the following components:   Fecal Occult Bld POSITIVE (*)    All other components within normal limits  D-DIMER, QUANTITATIVE  OCCULT BLOOD X 1 CARD TO LAB, STOOL  HEMOGLOBIN  HEMOGLOBIN  COMPREHENSIVE METABOLIC PANEL  CBC  TYPE AND SCREEN  PREPARE RBC (CROSSMATCH)  TROPONIN I (HIGH SENSITIVITY)  TROPONIN I (HIGH SENSITIVITY)    Notable for anemia, elevated BNP, mild AKI  EKG   EKG Interpretation Date/Time:  Thursday August 04 2022 13:21:28 EDT  Ventricular Rate:  56 PR Interval:  182 QRS Duration:  113 QT Interval:  478 QTC Calculation: 462 R Axis:   39  Text Interpretation: Sinus rhythm Borderline intraventricular conduction delay RSR' in V1 or V2, probably normal variant Nonspecific ST abnormality No significant change since last tracing Confirmed by Alvino Blood ([id number]) on 08/04/2022 2:10:35 PM         Imaging Studies ordered: I ordered imaging studies including CXR On my interpretation imaging demonstrates no acute process I independently visualized and interpreted imaging. I agree with the radiologist interpretation   Medicines ordered and prescription drug management: Meds ordered this encounter  Medications   pantoprazole (PROTONIX) injection 40 mg   0.9 %  sodium chloride infusion (Manually program via Guardrails IV Fluids)   furosemide (LASIX) injection 40 mg   traMADol (ULTRAM) tablet 50 mg   traZODone (DESYREL) tablet 25 mg   OR Linked Order Group     ondansetron (ZOFRAN) tablet 4 mg    ondansetron (ZOFRAN) injection 4 mg   albuterol (PROVENTIL) (2.5 MG/3ML) 0.083% nebulizer solution 2.5 mg   hydrOXYzine (ATARAX) tablet 10 mg   midodrine (PROAMATINE) tablet 5 mg    -I have reviewed the patients home medicines and have made adjustments as needed   Consultations Obtained: I requested consultation with the gastroenterologist,  and discussed lab and imaging findings as well as pertinent plan - they recommend: admission   Cardiac Monitoring: The patient was maintained on a cardiac monitor.  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/2011   2D ECHO - EF >55%, normal   HTN (hypertension)    Hyperprolactinemia (HCC)    dx in her 18, took meds, self d/c a while back   Liver function test abnormality    normal when repeated   Mild hyperlipidemia    Obesity    Palpitations    negative stress echo in November 2012 with normal LV function; mild LVH, proximal septal thickening with narrow LVOT and mild gradient; mild MR and TR; Cardionet showed PACs in November 2012   Pyoderma gangrenosa    Shortness of breath 07/11/2011   MET TEST      Dispostion: Disposition decision including need for hospitalization was considered, and patient admitted to the hospital.    Final Clinical Impression(s) / ED Diagnoses Final diagnoses:  Acute blood loss anemia      This chart was dictated using voice recognition software.  Despite best efforts to proofread,  errors can occur which can change the documentation meaning.    Lonell Grandchild, MD 08/04/22 [phone]

## 2022-08-04 NOTE — ED Notes (Signed)
EMS reports patient coming  from home was reporting weakness . CBG130 BP 90/62 HR 60 02 98 18 G L  400 cc given of fluids.

## 2022-08-04 NOTE — H&P (View-Only) (Signed)
[facility] Gastroenterology Consult  Referring Provider: No ref. provider found Primary Care Physician:  Karenann Cai, NP Primary Gastroenterologist: Gentry Fitz  Reason for Consultation: Anemia, fecal occult positive stool  SUBJECTIVE:   HPI: Christine Jacobson is a 67 y.o. female with past medical history significant for hypertrophic obstructive cardiomyopathy, hypertension, hyperlipidemia, pyoderma gangrenosum, persistent atrial fibrillation (on Eliquis) and obesity.  Presented to hospital today with chief complaint of 2-3 days of feeling generally unwell, increased fatigue, lightheadedness and dizziness.  She denied any recent changes in her bowel habits, has been having regular bowel movements.  Denied any abdominal pain.  No nausea or vomiting.  She has been experiencing some shortness of breath.  No chest pain.  Patient denied any prior abdominal surgeries.  She denies any previous EGD or colonoscopy.  No family history of colon cancer.  Was recently admitted to hospital 07/13/2022 - 07/21/2022 for bilateral lower extremity cellulitis, heart failure exacerbation.  Anemia was noted during that hospitalization, no signs of bleeding, she was recommended to follow-up in the outpatient setting as this appeared indolent.  Labs showed hemoglobin 5.4 (was 7.9 on 07/21/2022), platelet 321, WBC 9.7, sodium 139, potassium 4.5, BUN/creatinine 49/1.90, alkaline phosphatase 153, AST/ALT 15/15, total bilirubin 1.0.  Chest x-ray showed cardiomegaly, mild pulmonary vascular congestion.  Past Medical History:  Diagnosis Date   Chest pain 11/22/2010   2D STRESS ECHO - EF 60%, peak stress EF 80%, normal, no evidence for stress-induced ischemia   HOCM (hypertrophic obstructive cardiomyopathy) (HCC) 06/21/2011   2D ECHO - EF >55%, normal   HTN (hypertension)    Hyperprolactinemia (HCC)    dx in her 18, took meds, self d/c a while back   Liver function test abnormality    normal when repeated   Mild hyperlipidemia     Obesity    Palpitations    negative stress echo in November 2012 with normal LV function; mild LVH, proximal septal thickening with narrow LVOT and mild gradient; mild MR and TR; Cardionet showed PACs in November 2012   Pyoderma gangrenosa    Shortness of breath 07/11/2011   MET TEST   Past Surgical History:  Procedure Laterality Date   CARDIOVERSION N/A 02/01/2022   Procedure: CARDIOVERSION;  Surgeon: Thomasene Ripple, DO;  Location: [facility] ENDOSCOPY;  Service: Cardiovascular;  Laterality: N/A;   RIGHT HEART CATH N/A 10/15/2021   Procedure: RIGHT HEART CATH;  Surgeon: Elder Negus, MD;  Location: [facility] INVASIVE CV LAB;  Service: Cardiovascular;  Laterality: N/A;   SKIN GRAFT  2006   porcine, R leg   Prior to Admission medications   Medication Sig Start Date End Date Taking? Authorizing Provider  apixaban (ELIQUIS) 5 MG TABS tablet Take 1 tablet (5 mg total) by mouth 2 (two) times daily. 02/23/22  Yes Tobb, Kardie, DO  ascorbic acid (VITAMIN C) 500 MG tablet Take 1 tablet (500 mg total) by mouth daily for 30 days 06/10/22  Yes Leroy Sea, MD  furosemide (LASIX) 40 MG tablet Take 20 mg by mouth See admin instructions. Take 20 mg by mouth one to two times a day   Yes [provider]  Ibuprofen 200 MG CAPS Take 200-400 mg by mouth every 6 (six) hours as needed (for pain or headaches).   Yes [provider]  metoprolol succinate (TOPROL-XL) 100 MG 24 hr tablet TAKE ONE TABLET BY MOUTH TWICE A DAY WITH OR IMMEDIATELY FOLLOWING A MEAL Patient taking differently: Take 100 mg by mouth 2 (two) times daily. 03/02/22  Yes Tobb, Kardie, DO  mineral oil-hydrophilic petrolatum (AQUAPHOR) ointment Apply topically as needed for dry skin. Use with Unna boots please at the back of the wounds on the lower extremities when the Unna boots are changed 07/21/22  Yes Rhetta Mura, MD  potassium chloride SA (KLOR-CON M) 20 MEQ tablet Take 2 tablets (40 mEq total) by mouth 2 (two) times  daily. Patient taking differently: Take 20 mEq by mouth 2 (two) times daily. 07/21/22  Yes Rhetta Mura, MD  Zinc Sulfate 220 (50 Zn) MG TABS Take 1 tablet (220 mg total) by mouth daily for 30 days 06/10/22  Yes Leroy Sea, MD  cephALEXin (KEFLEX) 500 MG capsule Take 1 capsule (500 mg total) by mouth every 12 (twelve) hours. Patient not taking: Reported on 08/04/2022 07/21/22   Rhetta Mura, MD  hydrOXYzine (ATARAX) 10 MG tablet Take 1 tablet (10 mg total) by mouth 3 (three) times daily as needed for anxiety. Patient not taking: Reported on 08/04/2022 07/21/22   Rhetta Mura, MD  midodrine (PROAMATINE) 5 MG tablet Take 1 tablet (5 mg total) by mouth 2 (two) times daily with a meal. 06/09/22   Leroy Sea, MD  polyethylene glycol powder (GLYCOLAX/MIRALAX) 17 GM/SCOOP powder Take 1 capful (17 g) by mouth daily as needed for mild constipation. Patient not taking: Reported on 08/04/2022 06/09/22   Leroy Sea, MD   Current Facility-Administered Medications  Medication Dose Route Frequency Provider Last Rate Last Admin   albuterol (PROVENTIL) (2.5 MG/3ML) 0.083% nebulizer solution 2.5 mg  2.5 mg Nebulization Q2H PRN Kirby Crigler, Mir M, MD       hydrOXYzine (ATARAX) tablet 10 mg  10 mg Oral TID PRN Kirby Crigler, Mir M, MD       midodrine (PROAMATINE) tablet 5 mg  5 mg Oral BID WC Kirby Crigler, Mir M, MD   5 mg at 08/04/22 1703   ondansetron (ZOFRAN) tablet 4 mg  4 mg Oral Q6H PRN Maryln Gottron, MD       Or   ondansetron [facility]) injection 4 mg  4 mg Intravenous Q6H PRN Maryln Gottron, MD       Melene Muller ON 08/05/2022] pantoprazole (PROTONIX) injection 40 mg  40 mg Intravenous Q24H Kirby Crigler, Mir M, MD       traMADol Janean Sark) tablet 50 mg  50 mg Oral Q8H PRN Kirby Crigler, Mir M, MD       traZODone (DESYREL) tablet 25 mg  25 mg Oral QHS PRN Maryln Gottron, MD       Current Outpatient Medications  Medication Sig Dispense Refill   apixaban (ELIQUIS) 5 MG TABS tablet Take  1 tablet (5 mg total) by mouth 2 (two) times daily. 60 tablet 5   ascorbic acid (VITAMIN C) 500 MG tablet Take 1 tablet (500 mg total) by mouth daily for 30 days 100 tablet 0   furosemide (LASIX) 40 MG tablet Take 20 mg by mouth See admin instructions. Take 20 mg by mouth one to two times a day     Ibuprofen 200 MG CAPS Take 200-400 mg by mouth every 6 (six) hours as needed (for pain or headaches).     metoprolol succinate (TOPROL-XL) 100 MG 24 hr tablet TAKE ONE TABLET BY MOUTH TWICE A DAY WITH OR IMMEDIATELY FOLLOWING A MEAL (Patient taking differently: Take 100 mg by mouth 2 (two) times daily.) 60 tablet 0   mineral oil-hydrophilic petrolatum (AQUAPHOR) ointment Apply topically as needed for dry skin. Use with Unna boots please at  the back of the wounds on the lower extremities when the Unna boots are changed 420 g 0   potassium chloride SA (KLOR-CON M) 20 MEQ tablet Take 2 tablets (40 mEq total) by mouth 2 (two) times daily. (Patient taking differently: Take 20 mEq by mouth 2 (two) times daily.) 40 tablet 0   Zinc Sulfate 220 (50 Zn) MG TABS Take 1 tablet (220 mg total) by mouth daily for 30 days 100 tablet 0   cephALEXin (KEFLEX) 500 MG capsule Take 1 capsule (500 mg total) by mouth every 12 (twelve) hours. (Patient not taking: Reported on 08/04/2022) 2 capsule 0   hydrOXYzine (ATARAX) 10 MG tablet Take 1 tablet (10 mg total) by mouth 3 (three) times daily as needed for anxiety. (Patient not taking: Reported on 08/04/2022) 90 tablet 0   midodrine (PROAMATINE) 5 MG tablet Take 1 tablet (5 mg total) by mouth 2 (two) times daily with a meal. 60 tablet 0   polyethylene glycol powder (GLYCOLAX/MIRALAX) 17 GM/SCOOP powder Take 1 capful (17 g) by mouth daily as needed for mild constipation. (Patient not taking: Reported on 08/04/2022) 238 g 0   Allergies as of 08/04/2022 - Review Complete 08/04/2022  Allergen Reaction Noted   Ativan [lorazepam] Other (See Comments) 08/04/2022   Pork-derived products Other  (See Comments) 06/03/2022   Tylenol [acetaminophen] Nausea Only and Other (See Comments) 08/04/2022   Family History  Problem Relation Age of Onset   Heart disease Mother        endocarditis   Ovarian cancer Mother    Emphysema Father    Coronary artery disease Other        F in his 46   Diabetes Other        GP   Cancer Neg Hx    Social History   Socioeconomic History   Marital status: Single    Spouse name: Not on file   Number of children: 0   Years of education: Not on file   Highest education level: Not on file  Occupational History   Occupation: para Psychologist, forensic, part time cook  Tobacco Use   Smoking status: Former    Current packs/day: 1.00    Average packs/day: 1 pack/day for 4.0 years (4.0 ttl pk-yrs)    Types: Cigarettes   Smokeless tobacco: Never  Vaping Use   Vaping status: Never Used  Substance and Sexual Activity   Alcohol use: Not Currently    Alcohol/week: 1.0 standard drink of alcohol    Types: 1 Glasses of wine per week    Comment: "I do have my wine" every day, has cut down from 2 bottles at a time, currently 2-3 glasses   Drug use: No   Sexual activity: Not Currently  Other Topics Concern   Not on file  Social History Narrative   Lives by self   Social Determinants of Health   Financial Resource Strain: Low Risk  (12/03/2021)   Overall Financial Resource Strain (CARDIA)    Difficulty of Paying Living Expenses: Not very hard  Food Insecurity: No Food Insecurity (07/13/2022)   Hunger Vital Sign    Worried About Running Out of Food in the Last Year: Never true    Ran Out of Food in the Last Year: Never true  Recent Concern: Food Insecurity - Food Insecurity Present (06/02/2022)   Hunger Vital Sign    Worried About Running Out of Food in the Last Year: Sometimes true    Ran Out of Food in  the Last Year: Sometimes true  Transportation Needs: No Transportation Needs (07/13/2022)   PRAPARE - Administrator, Civil Service (Medical):  No    Lack of Transportation (Non-Medical): No  Physical Activity: Not on file  Stress: Not on file  Social Connections: Not on file  Intimate Partner Violence: Not At Risk (07/13/2022)   Humiliation, Afraid, Rape, and Kick questionnaire    Fear of Current or Ex-Partner: No    Emotionally Abused: No    Physically Abused: No    Sexually Abused: No   Review of Systems:  Review of Systems  Constitutional:  Positive for chills. Negative for fever.  Respiratory:  Positive for shortness of breath.   Cardiovascular:  Negative for chest pain.  Gastrointestinal:  Negative for abdominal pain, blood in stool, constipation, diarrhea, nausea and vomiting.  Neurological:  Positive for dizziness and weakness (Generalized).    OBJECTIVE:   Temp:  [97.7 F (36.5 C)-98.5 F (36.9 C)] 98.5 F (36.9 C) (07/18 1735) Pulse Rate:  [56-61] 59 (07/18 1730) Resp:  [13-29] 29 (07/18 1730) BP: (83-108)/(53-57) 88/54 (07/18 1730) SpO2:  [92 %-97 %] 92 % (07/18 1730)   Physical Exam Constitutional:      General: She is not in acute distress.    Appearance: She is not ill-appearing, toxic-appearing or diaphoretic.  Cardiovascular:     Rate and Rhythm: Normal rate and regular rhythm.  Pulmonary:     Effort: No respiratory distress.     Breath sounds: Normal breath sounds.     Comments: Somewhat tachypneic Abdominal:     General: Bowel sounds are normal. There is no distension.     Palpations: Abdomen is soft.     Tenderness: There is no abdominal tenderness. There is no guarding.  Musculoskeletal:     Right lower leg: Edema present.     Left lower leg: Edema present.  Skin:    General: Skin is warm and dry.     Coloration: Skin is pale.  Neurological:     Mental Status: She is alert.     Labs: Recent Labs    08/04/22 1430  WBC 9.7  HGB 5.4*  HCT 18.9*  PLT 321   BMET Recent Labs    08/04/22 1430  NA 139  K 4.5  CL 109  CO2 20*  GLUCOSE 110*  BUN 49*  CREATININE 1.90*   CALCIUM 9.3   LFT Recent Labs    08/04/22 1430  PROT 6.2*  ALBUMIN 3.3*  AST 15  ALT 15  ALKPHOS 153*  BILITOT 1.0   PT/INR Recent Labs    08/04/22 1430  LABPROT 29.1*  INR 2.7*    Diagnostic imaging: DG Chest Port 1 View  Result Date: 08/04/2022 CLINICAL DATA:  Chest pain. EXAM: PORTABLE CHEST 1 VIEW COMPARISON:  One-view chest x-ray 07/16/2022 FINDINGS: The heart is enlarged. Mild pulmonary vascular congestion is present without frank edema. No focal airspace disease present. No effusions are present. The visualized soft tissues bony thorax are normal. IMPRESSION: Cardiomegaly and mild pulmonary vascular congestion without frank edema. Electronically Signed   By: Marin Roberts M.D.   On: 08/04/2022 14:48    IMPRESSION: Normocytic anemia, iron studies 07/17/2022 unremarkable Fecal occult positive stool, concern for melena from ER provider Generalized weakness secondary to #1 History hypertrophic obstructive cardiomyopathy Atrial fibrillation, on Eliquis prior to admission Low folate on iron studies 07/17/2022  PLAN: -Patient resting comfortably in bed at time of exam, appears pale and somewhat tachypneic,  pending blood product transfusion (PRBC x 2) -I discussed concern for GI bleeding with patient given concern for melena as well as her anemia, she verbalized understanding, also discussed that endoscopic procedures can be completed to further evaluate bleeding/melena/anemia, she was not interested in endoscopy unless absolutely necessary -Trend H/H, transfuse for hemoglobin less than 7 -IV PPI Q12Hr -Okay for diet this evening -[facility] will follow   LOS: 0 days   Liliane Shi, DO [facility]

## 2022-08-04 NOTE — Consult Note (Addendum)
[facility] Gastroenterology Consult  Referring Provider: No ref. provider found Primary Care Physician:  Karenann Cai, NP Primary Gastroenterologist: Gentry Fitz  Reason for Consultation: Anemia, fecal occult positive stool  SUBJECTIVE:   HPI: Christine Jacobson is a 67 y.o. female with past medical history significant for hypertrophic obstructive cardiomyopathy, hypertension, hyperlipidemia, pyoderma gangrenosum, persistent atrial fibrillation (on Eliquis) and obesity.  Presented to hospital today with chief complaint of 2-3 days of feeling generally unwell, increased fatigue, lightheadedness and dizziness.  She denied any recent changes in her bowel habits, has been having regular bowel movements.  Denied any abdominal pain.  No nausea or vomiting.  She has been experiencing some shortness of breath.  No chest pain.  Patient denied any prior abdominal surgeries.  She denies any previous EGD or colonoscopy.  No family history of colon cancer.  Was recently admitted to hospital 07/13/2022 - 07/21/2022 for bilateral lower extremity cellulitis, heart failure exacerbation.  Anemia was noted during that hospitalization, no signs of bleeding, she was recommended to follow-up in the outpatient setting as this appeared indolent.  Labs showed hemoglobin 5.4 (was 7.9 on 07/21/2022), platelet 321, WBC 9.7, sodium 139, potassium 4.5, BUN/creatinine 49/1.90, alkaline phosphatase 153, AST/ALT 15/15, total bilirubin 1.0.  Chest x-ray showed cardiomegaly, mild pulmonary vascular congestion.  Past Medical History:  Diagnosis Date   Chest pain 11/22/2010   2D STRESS ECHO - EF 60%, peak stress EF 80%, normal, no evidence for stress-induced ischemia   HOCM (hypertrophic obstructive cardiomyopathy) (HCC) 06/21/2011   2D ECHO - EF >55%, normal   HTN (hypertension)    Hyperprolactinemia (HCC)    dx in her 18, took meds, self d/c a while back   Liver function test abnormality    normal when repeated   Mild hyperlipidemia     Obesity    Palpitations    negative stress echo in November 2012 with normal LV function; mild LVH, proximal septal thickening with narrow LVOT and mild gradient; mild MR and TR; Cardionet showed PACs in November 2012   Pyoderma gangrenosa    Shortness of breath 07/11/2011   MET TEST   Past Surgical History:  Procedure Laterality Date   CARDIOVERSION N/A 02/01/2022   Procedure: CARDIOVERSION;  Surgeon: Thomasene Ripple, DO;  Location: [facility] ENDOSCOPY;  Service: Cardiovascular;  Laterality: N/A;   RIGHT HEART CATH N/A 10/15/2021   Procedure: RIGHT HEART CATH;  Surgeon: Elder Negus, MD;  Location: [facility] INVASIVE CV LAB;  Service: Cardiovascular;  Laterality: N/A;   SKIN GRAFT  2006   porcine, R leg   Prior to Admission medications   Medication Sig Start Date End Date Taking? Authorizing Provider  apixaban (ELIQUIS) 5 MG TABS tablet Take 1 tablet (5 mg total) by mouth 2 (two) times daily. 02/23/22  Yes Tobb, Kardie, DO  ascorbic acid (VITAMIN C) 500 MG tablet Take 1 tablet (500 mg total) by mouth daily for 30 days 06/10/22  Yes Leroy Sea, MD  furosemide (LASIX) 40 MG tablet Take 20 mg by mouth See admin instructions. Take 20 mg by mouth one to two times a day   Yes [provider]  Ibuprofen 200 MG CAPS Take 200-400 mg by mouth every 6 (six) hours as needed (for pain or headaches).   Yes [provider]  metoprolol succinate (TOPROL-XL) 100 MG 24 hr tablet TAKE ONE TABLET BY MOUTH TWICE A DAY WITH OR IMMEDIATELY FOLLOWING A MEAL Patient taking differently: Take 100 mg by mouth 2 (two) times daily. 03/02/22  Yes Tobb, Kardie, DO  mineral oil-hydrophilic petrolatum (AQUAPHOR) ointment Apply topically as needed for dry skin. Use with Unna boots please at the back of the wounds on the lower extremities when the Unna boots are changed 07/21/22  Yes Rhetta Mura, MD  potassium chloride SA (KLOR-CON M) 20 MEQ tablet Take 2 tablets (40 mEq total) by mouth 2 (two) times  daily. Patient taking differently: Take 20 mEq by mouth 2 (two) times daily. 07/21/22  Yes Rhetta Mura, MD  Zinc Sulfate 220 (50 Zn) MG TABS Take 1 tablet (220 mg total) by mouth daily for 30 days 06/10/22  Yes Leroy Sea, MD  cephALEXin (KEFLEX) 500 MG capsule Take 1 capsule (500 mg total) by mouth every 12 (twelve) hours. Patient not taking: Reported on 08/04/2022 07/21/22   Rhetta Mura, MD  hydrOXYzine (ATARAX) 10 MG tablet Take 1 tablet (10 mg total) by mouth 3 (three) times daily as needed for anxiety. Patient not taking: Reported on 08/04/2022 07/21/22   Rhetta Mura, MD  midodrine (PROAMATINE) 5 MG tablet Take 1 tablet (5 mg total) by mouth 2 (two) times daily with a meal. 06/09/22   Leroy Sea, MD  polyethylene glycol powder (GLYCOLAX/MIRALAX) 17 GM/SCOOP powder Take 1 capful (17 g) by mouth daily as needed for mild constipation. Patient not taking: Reported on 08/04/2022 06/09/22   Leroy Sea, MD   Current Facility-Administered Medications  Medication Dose Route Frequency Provider Last Rate Last Admin   albuterol (PROVENTIL) (2.5 MG/3ML) 0.083% nebulizer solution 2.5 mg  2.5 mg Nebulization Q2H PRN Kirby Crigler, Mir M, MD       hydrOXYzine (ATARAX) tablet 10 mg  10 mg Oral TID PRN Kirby Crigler, Mir M, MD       midodrine (PROAMATINE) tablet 5 mg  5 mg Oral BID WC Kirby Crigler, Mir M, MD   5 mg at 08/04/22 1703   ondansetron (ZOFRAN) tablet 4 mg  4 mg Oral Q6H PRN Maryln Gottron, MD       Or   ondansetron [facility]) injection 4 mg  4 mg Intravenous Q6H PRN Maryln Gottron, MD       Melene Muller ON 08/05/2022] pantoprazole (PROTONIX) injection 40 mg  40 mg Intravenous Q24H Kirby Crigler, Mir M, MD       traMADol Janean Sark) tablet 50 mg  50 mg Oral Q8H PRN Kirby Crigler, Mir M, MD       traZODone (DESYREL) tablet 25 mg  25 mg Oral QHS PRN Maryln Gottron, MD       Current Outpatient Medications  Medication Sig Dispense Refill   apixaban (ELIQUIS) 5 MG TABS tablet Take  1 tablet (5 mg total) by mouth 2 (two) times daily. 60 tablet 5   ascorbic acid (VITAMIN C) 500 MG tablet Take 1 tablet (500 mg total) by mouth daily for 30 days 100 tablet 0   furosemide (LASIX) 40 MG tablet Take 20 mg by mouth See admin instructions. Take 20 mg by mouth one to two times a day     Ibuprofen 200 MG CAPS Take 200-400 mg by mouth every 6 (six) hours as needed (for pain or headaches).     metoprolol succinate (TOPROL-XL) 100 MG 24 hr tablet TAKE ONE TABLET BY MOUTH TWICE A DAY WITH OR IMMEDIATELY FOLLOWING A MEAL (Patient taking differently: Take 100 mg by mouth 2 (two) times daily.) 60 tablet 0   mineral oil-hydrophilic petrolatum (AQUAPHOR) ointment Apply topically as needed for dry skin. Use with Unna boots please at  the back of the wounds on the lower extremities when the Unna boots are changed 420 g 0   potassium chloride SA (KLOR-CON M) 20 MEQ tablet Take 2 tablets (40 mEq total) by mouth 2 (two) times daily. (Patient taking differently: Take 20 mEq by mouth 2 (two) times daily.) 40 tablet 0   Zinc Sulfate 220 (50 Zn) MG TABS Take 1 tablet (220 mg total) by mouth daily for 30 days 100 tablet 0   cephALEXin (KEFLEX) 500 MG capsule Take 1 capsule (500 mg total) by mouth every 12 (twelve) hours. (Patient not taking: Reported on 08/04/2022) 2 capsule 0   hydrOXYzine (ATARAX) 10 MG tablet Take 1 tablet (10 mg total) by mouth 3 (three) times daily as needed for anxiety. (Patient not taking: Reported on 08/04/2022) 90 tablet 0   midodrine (PROAMATINE) 5 MG tablet Take 1 tablet (5 mg total) by mouth 2 (two) times daily with a meal. 60 tablet 0   polyethylene glycol powder (GLYCOLAX/MIRALAX) 17 GM/SCOOP powder Take 1 capful (17 g) by mouth daily as needed for mild constipation. (Patient not taking: Reported on 08/04/2022) 238 g 0   Allergies as of 08/04/2022 - Review Complete 08/04/2022  Allergen Reaction Noted   Ativan [lorazepam] Other (See Comments) 08/04/2022   Pork-derived products Other  (See Comments) 06/03/2022   Tylenol [acetaminophen] Nausea Only and Other (See Comments) 08/04/2022   Family History  Problem Relation Age of Onset   Heart disease Mother        endocarditis   Ovarian cancer Mother    Emphysema Father    Coronary artery disease Other        F in his 46   Diabetes Other        GP   Cancer Neg Hx    Social History   Socioeconomic History   Marital status: Single    Spouse name: Not on file   Number of children: 0   Years of education: Not on file   Highest education level: Not on file  Occupational History   Occupation: para Psychologist, forensic, part time cook  Tobacco Use   Smoking status: Former    Current packs/day: 1.00    Average packs/day: 1 pack/day for 4.0 years (4.0 ttl pk-yrs)    Types: Cigarettes   Smokeless tobacco: Never  Vaping Use   Vaping status: Never Used  Substance and Sexual Activity   Alcohol use: Not Currently    Alcohol/week: 1.0 standard drink of alcohol    Types: 1 Glasses of wine per week    Comment: "I do have my wine" every day, has cut down from 2 bottles at a time, currently 2-3 glasses   Drug use: No   Sexual activity: Not Currently  Other Topics Concern   Not on file  Social History Narrative   Lives by self   Social Determinants of Health   Financial Resource Strain: Low Risk  (12/03/2021)   Overall Financial Resource Strain (CARDIA)    Difficulty of Paying Living Expenses: Not very hard  Food Insecurity: No Food Insecurity (07/13/2022)   Hunger Vital Sign    Worried About Running Out of Food in the Last Year: Never true    Ran Out of Food in the Last Year: Never true  Recent Concern: Food Insecurity - Food Insecurity Present (06/02/2022)   Hunger Vital Sign    Worried About Running Out of Food in the Last Year: Sometimes true    Ran Out of Food in  the Last Year: Sometimes true  Transportation Needs: No Transportation Needs (07/13/2022)   PRAPARE - Administrator, Civil Service (Medical):  No    Lack of Transportation (Non-Medical): No  Physical Activity: Not on file  Stress: Not on file  Social Connections: Not on file  Intimate Partner Violence: Not At Risk (07/13/2022)   Humiliation, Afraid, Rape, and Kick questionnaire    Fear of Current or Ex-Partner: No    Emotionally Abused: No    Physically Abused: No    Sexually Abused: No   Review of Systems:  Review of Systems  Constitutional:  Positive for chills. Negative for fever.  Respiratory:  Positive for shortness of breath.   Cardiovascular:  Negative for chest pain.  Gastrointestinal:  Negative for abdominal pain, blood in stool, constipation, diarrhea, nausea and vomiting.  Neurological:  Positive for dizziness and weakness (Generalized).    OBJECTIVE:   Temp:  [97.7 F (36.5 C)-98.5 F (36.9 C)] 98.5 F (36.9 C) (07/18 1735) Pulse Rate:  [56-61] 59 (07/18 1730) Resp:  [13-29] 29 (07/18 1730) BP: (83-108)/(53-57) 88/54 (07/18 1730) SpO2:  [92 %-97 %] 92 % (07/18 1730)   Physical Exam Constitutional:      General: She is not in acute distress.    Appearance: She is not ill-appearing, toxic-appearing or diaphoretic.  Cardiovascular:     Rate and Rhythm: Normal rate and regular rhythm.  Pulmonary:     Effort: No respiratory distress.     Breath sounds: Normal breath sounds.     Comments: Somewhat tachypneic Abdominal:     General: Bowel sounds are normal. There is no distension.     Palpations: Abdomen is soft.     Tenderness: There is no abdominal tenderness. There is no guarding.  Musculoskeletal:     Right lower leg: Edema present.     Left lower leg: Edema present.  Skin:    General: Skin is warm and dry.     Coloration: Skin is pale.  Neurological:     Mental Status: She is alert.     Labs: Recent Labs    08/04/22 1430  WBC 9.7  HGB 5.4*  HCT 18.9*  PLT 321   BMET Recent Labs    08/04/22 1430  NA 139  K 4.5  CL 109  CO2 20*  GLUCOSE 110*  BUN 49*  CREATININE 1.90*   CALCIUM 9.3   LFT Recent Labs    08/04/22 1430  PROT 6.2*  ALBUMIN 3.3*  AST 15  ALT 15  ALKPHOS 153*  BILITOT 1.0   PT/INR Recent Labs    08/04/22 1430  LABPROT 29.1*  INR 2.7*    Diagnostic imaging: DG Chest Port 1 View  Result Date: 08/04/2022 CLINICAL DATA:  Chest pain. EXAM: PORTABLE CHEST 1 VIEW COMPARISON:  One-view chest x-ray 07/16/2022 FINDINGS: The heart is enlarged. Mild pulmonary vascular congestion is present without frank edema. No focal airspace disease present. No effusions are present. The visualized soft tissues bony thorax are normal. IMPRESSION: Cardiomegaly and mild pulmonary vascular congestion without frank edema. Electronically Signed   By: Marin Roberts M.D.   On: 08/04/2022 14:48    IMPRESSION: Normocytic anemia, iron studies 07/17/2022 unremarkable Fecal occult positive stool, concern for melena from ER provider Generalized weakness secondary to #1 History hypertrophic obstructive cardiomyopathy Atrial fibrillation, on Eliquis prior to admission Low folate on iron studies 07/17/2022  PLAN: -Patient resting comfortably in bed at time of exam, appears pale and somewhat tachypneic,  pending blood product transfusion (PRBC x 2) -I discussed concern for GI bleeding with patient given concern for melena as well as her anemia, she verbalized understanding, also discussed that endoscopic procedures can be completed to further evaluate bleeding/melena/anemia, she was not interested in endoscopy unless absolutely necessary -Trend H/H, transfuse for hemoglobin less than 7 -IV PPI Q12Hr -Okay for diet this evening -[facility] will follow   LOS: 0 days   Liliane Shi, DO [facility]

## 2022-08-05 ENCOUNTER — Encounter (HOSPITAL_COMMUNITY): Payer: Self-pay | Admitting: Internal Medicine

## 2022-08-05 DIAGNOSIS — N179 Acute kidney failure, unspecified: Secondary | ICD-10-CM

## 2022-08-05 DIAGNOSIS — D6869 Other thrombophilia: Secondary | ICD-10-CM | POA: Diagnosis not present

## 2022-08-05 DIAGNOSIS — I5032 Chronic diastolic (congestive) heart failure: Secondary | ICD-10-CM

## 2022-08-05 DIAGNOSIS — K922 Gastrointestinal hemorrhage, unspecified: Secondary | ICD-10-CM | POA: Insufficient documentation

## 2022-08-05 DIAGNOSIS — Z7901 Long term (current) use of anticoagulants: Secondary | ICD-10-CM

## 2022-08-05 DIAGNOSIS — D62 Acute posthemorrhagic anemia: Secondary | ICD-10-CM | POA: Diagnosis not present

## 2022-08-05 LAB — CBC
HCT: 24.1 % — ABNORMAL LOW (ref 36.0–46.0)
HCT: 24.2 % — ABNORMAL LOW (ref 36.0–46.0)
HCT: 22.9 % — ABNORMAL LOW (ref 36.0–46.0)
Hemoglobin: 7.5 g/dL — ABNORMAL LOW (ref 12.0–15.0)
Hemoglobin: 7.4 g/dL — ABNORMAL LOW (ref 12.0–15.0)
Hemoglobin: 6.8 g/dL — CL (ref 12.0–15.0)
MCH: 30.4 pg (ref 26.0–34.0)
MCH: 29.6 pg (ref 26.0–34.0)
MCH: 28.8 pg (ref 26.0–34.0)
MCHC: 31.1 g/dL (ref 30.0–36.0)
MCHC: 30.6 g/dL (ref 30.0–36.0)
MCHC: 29.7 g/dL — ABNORMAL LOW (ref 30.0–36.0)
MCV: 97.6 fL (ref 80.0–100.0)
MCV: 96.8 fL (ref 80.0–100.0)
MCV: 97 fL (ref 80.0–100.0)
Platelets: 316 10*3/uL (ref 150–400)
Platelets: 272 10*3/uL (ref 150–400)
Platelets: 260 10*3/uL (ref 150–400)
RBC: 2.47 MIL/uL — ABNORMAL LOW (ref 3.87–5.11)
RBC: 2.5 MIL/uL — ABNORMAL LOW (ref 3.87–5.11)
RBC: 2.36 MIL/uL — ABNORMAL LOW (ref 3.87–5.11)
RDW: 17.2 % — ABNORMAL HIGH (ref 11.5–15.5)
RDW: 17.6 % — ABNORMAL HIGH (ref 11.5–15.5)
RDW: 17.8 % — ABNORMAL HIGH (ref 11.5–15.5)
WBC: 9.6 10*3/uL (ref 4.0–10.5)
WBC: 9.7 10*3/uL (ref 4.0–10.5)
WBC: 8.1 10*3/uL (ref 4.0–10.5)
nRBC: 0.8 % — ABNORMAL HIGH (ref 0.0–0.2)
nRBC: 0.9 % — ABNORMAL HIGH (ref 0.0–0.2)
nRBC: 1 % — ABNORMAL HIGH (ref 0.0–0.2)

## 2022-08-05 LAB — BPAM RBC
Blood Product Expiration Date: 202408112359
ISSUE DATE / TIME: 202407182202
Unit Type and Rh: 6200
Unit Type and Rh: 6200

## 2022-08-05 LAB — COMPREHENSIVE METABOLIC PANEL
ALT: 19 U/L (ref 0–44)
AST: 37 U/L (ref 15–41)
Albumin: 2.9 g/dL — ABNORMAL LOW (ref 3.5–5.0)
Alkaline Phosphatase: 138 U/L — ABNORMAL HIGH (ref 38–126)
Anion gap: 9 (ref 5–15)
BUN: 51 mg/dL — ABNORMAL HIGH (ref 8–23)
CO2: 20 mmol/L — ABNORMAL LOW (ref 22–32)
Calcium: 9 mg/dL (ref 8.9–10.3)
Chloride: 108 mmol/L (ref 98–111)
Creatinine, Ser: 1.94 mg/dL — ABNORMAL HIGH (ref 0.44–1.00)
GFR, Estimated: 28 mL/min — ABNORMAL LOW (ref >60)
Glucose, Bld: 106 mg/dL — ABNORMAL HIGH (ref 70–99)
Potassium: 4.5 mmol/L (ref 3.5–5.1)
Sodium: 137 mmol/L (ref 135–145)
Total Bilirubin: 1 mg/dL (ref 0.3–1.2)
Total Protein: 5.5 g/dL — ABNORMAL LOW (ref 6.5–8.1)

## 2022-08-05 LAB — TYPE AND SCREEN
ABO/RH(D): A POS
Antibody Screen: NEGATIVE
Unit division: 0
Unit division: 0

## 2022-08-05 LAB — PREPARE RBC (CROSSMATCH)

## 2022-08-05 MED ORDER — SODIUM CHLORIDE 0.9% IV SOLUTION: Freq: Once | INTRAVENOUS | Status: AC

## 2022-08-05 MED ORDER — CHLORHEXIDINE GLUCONATE CLOTH 2 % EX PADS
6.0000 | MEDICATED_PAD | Freq: Every day | CUTANEOUS | Status: DC
  Administered 2022-08-05 – 2022-08-07 (×3): 6 via TOPICAL

## 2022-08-05 NOTE — Consult Note (Signed)
WOC Nurse Consult Note: patient is well known to WOC team from previous admissions; chronic venous insufficiency, [facility] and currently has home health come out and place unna boots weekly  Reason for Consult: R lower extremity wound  Wound type:  full thickness r/t venous insufficiency  Pressure Injury POA: NA  Measurement: R posterior lower leg venous ulcer 7 cm x 3 cm x 0.1 cm 60% yellow 40% pink moist; distal to this area 3 cm x 3 cm x 0.1 cm venous ulcer 60% yellow 40% pink moist   Drainage (amount, consistency, odor) minimal serosanguinous  Periwound: mild erythema edema; overall legs are much improved from previous visits  Dressing procedure/placement/frequency: Clean R posterior leg ulcers with NS, apply silver hydrofiber [place] [phone]) to wound bed daily, cover with ABD pad wrap with Kerlix starting just above toes and ending right below knees.  May secure with Ace wrap if desired for mild compression.  MOISTEN SILVER WITH NS IF STUCK TO WOUND BED FOR ATRAUMATIC REMOVAL.   Patient wears unna boots at home and would benefit from continued application of these by home health once discharged for maintenance of venous insufficiency.   WOC team will not follow at this time. Re-consult if further needs arise.   Thank you,    Priscella Mann MSN, RN-BC, [employer] [phone]

## 2022-08-05 NOTE — Hospital Course (Addendum)
67yow complicated PMH including afib on apixaban, recent discharge 7/4 after tx for LE cellultis, presented w/ weaness, found to have low BP, severe anemia. Admitted for GIB and further evaluation.  [facility]ult[facility] GI  Procedures

## 2022-08-05 NOTE — Progress Notes (Signed)
Progress Note   Patient: Christine Jacobson ZOX:[id number] DOB: [birth year] DOA: 08/04/2022     1 DOS: the patient was seen and examined on 08/05/2022   Brief hospital course: 67yow complicated PMH including afib on apixaban, recent discharge 7/4 after tx for LE cellultis, presented w/ weaness, found to have low BP, severe anemia. Admitted for GIB and further evaluation.  [facility] GI  Procedures   Assessment and Plan: This is a chronically ill 67 year old female with a history of hypertension, hyperlipidemia, obesity, pyoderma gangrenosum, atrial fibrillation on apixaban admitted to the hospital with hypotension and acute blood loss anemia due to suspected GI bleed.   Acute blood loss anemia- Secondary to GIB Complicated by anticoagulation (apixaban) Associated with hypotension Severe anemia w/ Hgb <6 on admission. Does not check stools, was unaware of bleed; no previous hx known. Uses ibuprofen 2 tabs every other day for low back pain. No charted bleeding. Hemodynamics stable. Trend CBC; currently s/p 2 units PRBC. Hgb 5.4 > 5.2 at 2041 7/18 was 7.9 on 7/4. Continue IV PPI daily Seen by GI; pt inititally deferred endoscopy but now willing to proceed if GI recs ER provider has consulted GI Dr. Lorenso Quarry   AKI CKD stage IIIa Secondary to hypovolemia, hypotension secondary to ABLA, diuretic Creatinine 1.9 > repeat pending (baseline 1.0-1.2) Trend BMP  Chronic diastolic congestive heart failure BNP 1630 (baseline [phone]) Troponins negative. Elevated BNP, some congestion on CXR, but clinically stable and dry Last echo 2019 w/ normal LVEF, grade 2 diastolic dysfunction Resume metoprolol when BP alows   Atrial fibrillation Acquired thrombophilia On metoprolol and Eliquis at home, currently in normal sinus rhythm Continue BB when BP allow Holding apixaban for above   Bilateral lower extremity cellulitis  Right lower extremity wound Resolved. Legs look good per WOC RN   Essential  hypertension BP stable    Class II obesity Body mass index is 33.64 kg/m.    Subjective:  Feels better No CP, no SOB No abd pain No dizziness No bleeding in room that she is aware of  Physical Exam: Vitals:   08/05/22 0153 08/05/22 0412 08/05/22 0459 08/05/22 0800  BP: 105/86  (!) 120/48   Pulse: 61  (!) 56   Resp: 20  16   Temp: 97.9 F (36.6 C) 97.6 F (36.4 C) 97.8 F (36.6 C) 98.2 F (36.8 C)  TempSrc: Oral Oral Oral Oral  SpO2:   91%   Weight:      Height:       Physical Exam Vitals reviewed.  Constitutional:      General: She is not in acute distress.    Appearance: She is not ill-appearing or toxic-appearing.  Cardiovascular:     Rate and Rhythm: Normal rate and regular rhythm.     Heart sounds: No murmur heard. Pulmonary:     Effort: Pulmonary effort is normal. No respiratory distress.     Breath sounds: No wheezing, rhonchi or rales.  Abdominal:     Palpations: Abdomen is soft.  Neurological:     Mental Status: She is alert.  Psychiatric:        Mood and Affect: Mood normal.        Behavior: Behavior normal.     Data Reviewed: AFVSS Hgb 5.4 > 5.2 at 2041 7/18 was 7.9 on 7/4 Creatinine 1.9 > (baseline 1.0-1.2) BNP 1630 (baseline [phone]) Troponins negative CXR nonacute Last echo 2019 w/ normal LVEF, grade 2 diastolic dysfunction  Family Communication: None  Disposition: Status is: Inpatient  Remains inpatient appropriate because: GIB, severe anemia     Time spent: 35 minutes  Author: Brendia Sacks, MD 08/05/2022 9:56 AM  For on call review [email or web address].

## 2022-08-05 NOTE — Plan of Care (Signed)
  Problem: Clinical Measurements: Goal: Diagnostic test results will improve Outcome: Progressing Goal: Respiratory complications will improve Outcome: Progressing Goal: Cardiovascular complication will be avoided Outcome: Progressing   

## 2022-08-05 NOTE — Progress Notes (Signed)
[facility] Progress Note  SUBJECTIVE:   Interval history: Christine Jacobson was seen and evaluated today at bedside. Resting comfortably in bed. Denied chest pain. Denied shortness of breath. No nausea or vomiting, ate breakfast this AM. No bowel movement since admission. No abdominal pain. Having some pain in her right lower extremity. Last Eliquis was prior to her presentation to hospital on 08/04/22 AM.   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   2D ECHO - EF >55%, normal   HTN (hypertension)    Hyperprolactinemia (HCC)    dx in her 18, took meds, self d/c a while back   Liver function test abnormality    normal when repeated   Mild hyperlipidemia    Obesity    Palpitations    negative stress echo in November 2012 with normal LV function; mild LVH, proximal septal thickening with narrow LVOT and mild gradient; mild MR and TR; Cardionet showed PACs in November 2012   Pyoderma gangrenosa    Shortness of breath 07/11/2011   MET TEST   Past Surgical History:  Procedure Laterality Date   CARDIOVERSION N/A 02/01/2022   Procedure: CARDIOVERSION;  Surgeon: Thomasene Ripple, DO;  Location: [facility];  Service: Cardiovascular;  Laterality: N/A;   RIGHT HEART CATH N/A 10/15/2021   Procedure: RIGHT HEART CATH;  Surgeon: Elder Negus, MD;  Location: [facility] INVASIVE CV LAB;  Service: Cardiovascular;  Laterality: N/A;   SKIN GRAFT  2006   porcine, R leg   Current Facility-Administered Medications  Medication Dose Route Frequency Provider Last Rate Last Admin   albuterol (PROVENTIL) (2.5 MG/3ML) 0.083% nebulizer solution 2.5 mg  2.5 mg Nebulization Q2H PRN Kirby Crigler, Mir M, MD       Chlorhexidine Gluconate Cloth 2 % PADS 6 each  6 each Topical Daily Kirby Crigler,  Mir M, MD       hydrOXYzine (ATARAX) tablet 10 mg  10 mg Oral TID PRN Kirby Crigler, Mir M, MD       midodrine (PROAMATINE) tablet 5 mg  5 mg Oral BID WC Kirby Crigler, Mir M, MD   5 mg at 08/05/22 0900   ondansetron (ZOFRAN) tablet 4 mg  4 mg Oral Q6H PRN Kirby Crigler, Mir M, MD       Or   ondansetron [facility]) injection 4 mg  4 mg Intravenous Q6H PRN Kirby Crigler, Mir M, MD   4 mg at 08/05/22 0240   pantoprazole (PROTONIX) injection 40 mg  40 mg Intravenous Q12H Liliane Shi H, DO   40 mg at 08/05/22 1009   traMADol (ULTRAM) tablet 50 mg  50 mg Oral Q8H PRN Kirby Crigler, Mir M, MD   50 mg at 08/05/22 0154   traZODone (DESYREL) tablet 25 mg  25 mg Oral QHS PRN Maryln Gottron, MD       Allergies as of 08/04/2022 - Review Complete 08/04/2022  Allergen Reaction Noted   Ativan [lorazepam] Other (See Comments) 08/04/2022   Pork-derived products Other (See Comments) 06/03/2022   Tylenol [acetaminophen] Nausea Only and Other (See Comments) 08/04/2022   Review of Systems:  Review of Systems  Respiratory:  Negative for shortness of breath.   Cardiovascular:  Negative for chest pain.  Gastrointestinal:  Negative for abdominal pain, nausea and vomiting.    OBJECTIVE:   Temp:  [97.6  F (36.4 C)-98.5 F (36.9 C)] 98.2 F (36.8 C) (07/19 0800) Pulse Rate:  [54-66] 56 (07/19 0900) Resp:  [13-29] 13 (07/19 1000) BP: (83-120)/(42-86) 110/55 (07/19 1000) SpO2:  [91 %-97 %] 95 % (07/19 0900) Last BM Date : 08/04/22 Physical Exam Constitutional:      General: She is not in acute distress.    Appearance: She is not ill-appearing, toxic-appearing or diaphoretic.  Cardiovascular:     Rate and Rhythm: Regular rhythm. Bradycardia present.     Heart sounds: Murmur heard.  Pulmonary:     Effort: No respiratory distress.     Breath sounds: Normal breath sounds.  Abdominal:     General: Bowel sounds are normal. There is no distension.     Palpations: Abdomen is soft.     Tenderness: There is no abdominal  tenderness. There is no guarding.  Neurological:     Mental Status: She is alert.     Labs: Recent Labs    08/04/22 1430 08/04/22 2041 08/05/22 0657  WBC 9.7  --  9.6  HGB 5.4* 5.2* 7.5*  HCT 18.9*  --  24.1*  PLT 321  --  316   BMET Recent Labs    08/04/22 1430 08/05/22 0657  NA 139 137  K 4.5 4.5  CL 109 108  CO2 20* 20*  GLUCOSE 110* 106*  BUN 49* 51*  CREATININE 1.90* 1.94*  CALCIUM 9.3 9.0   LFT Recent Labs    08/05/22 0657  PROT 5.5*  ALBUMIN 2.9*  AST 37  ALT 19  ALKPHOS 138*  BILITOT 1.0   PT/INR Recent Labs    08/04/22 1430  LABPROT 29.1*  INR 2.7*   Diagnostic imaging: DG Chest Port 1 View  Result Date: 08/04/2022 CLINICAL DATA:  Chest pain. EXAM: PORTABLE CHEST 1 VIEW COMPARISON:  One-view chest x-ray 07/16/2022 FINDINGS: The heart is enlarged. Mild pulmonary vascular congestion is present without frank edema. No focal airspace disease present. No effusions are present. The visualized soft tissues bony thorax are normal. IMPRESSION: Cardiomegaly and mild pulmonary vascular congestion without frank edema. Electronically Signed   By: Marin Roberts M.D.   On: 08/04/2022 14:48    IMPRESSION: Normocytic anemia, iron studies 07/17/2022 unremarkable  -Awaiting AM labs Fecal occult positive stool, concern for melena from ER provider  -No further bowel movement Generalized weakness secondary to #1 History hypertrophic obstructive cardiomyopathy Atrial fibrillation, on Eliquis prior to admission  -Last dose 08/04/22 AM Low folate on iron studies 07/17/2022  PLAN: -Await AM labs -Continue IV PPI Q12Hr -Monitor bowel movements -Patient agreeable to EGD if Hgb continues to downtrend and if melena persists, discussed procedure with her including benefits, alternatives and risks of bleeding/infection/perforation/missed lesion/anesthesia, she verbalized understanding and elected to proceed if necessary -Trend H/H, transfuse for Hgb < 7  -Diet as  tolerated  -Make NPO at midnight 08/06/22 for possible EGD   LOS: 1 day   Liliane Shi, DO [facility]

## 2022-08-05 NOTE — Progress Notes (Signed)
    Patient Name: Christine Jacobson           DOB: March 07, 1955  MRN: [id number]      Admission Date: 08/04/2022  Attending Provider: Standley Brooking, MD  Primary Diagnosis: Acute blood loss anemia   Level of care: Stepdown    CROSS COVER NOTE   Date of Service   08/05/2022   Christine Jacobson, 67 y.o. female, was admitted on 08/04/2022 for Acute blood loss anemia.    HPI/Events of Note    Acute blood loss anemia Secondary to GIB- Fecal occult positive stool, concern for melena  Complicated by anticoagulation (apixaban) Hemoglobin 7.4 -->  6.8. Received 2 units pRBC so far.  No acute changes reported.  Hemodynamically stable. No melena, hematochezia, or other bleeding reported tonight.    Interventions/ Plan   Blood transfusion, 1 unit PRBC Trend H&H Transfuse for Hgb < 7         Anthoney Harada, DNP, [employer] [facility] [employer]

## 2022-08-06 DIAGNOSIS — D62 Acute posthemorrhagic anemia: Secondary | ICD-10-CM | POA: Diagnosis not present

## 2022-08-06 DIAGNOSIS — N179 Acute kidney failure, unspecified: Secondary | ICD-10-CM | POA: Diagnosis not present

## 2022-08-06 DIAGNOSIS — I4819 Other persistent atrial fibrillation: Secondary | ICD-10-CM | POA: Diagnosis not present

## 2022-08-06 DIAGNOSIS — K922 Gastrointestinal hemorrhage, unspecified: Secondary | ICD-10-CM | POA: Diagnosis not present

## 2022-08-06 LAB — TYPE AND SCREEN: Unit division: 0

## 2022-08-06 LAB — BPAM RBC
Blood Product Expiration Date: 202408112359
Blood Product Expiration Date: 202408152359
ISSUE DATE / TIME: 202407190130
ISSUE DATE / TIME: 202407192212
Unit Type and Rh: 6200

## 2022-08-06 LAB — CBC
HCT: 24.9 % — ABNORMAL LOW (ref 36.0–46.0)
HCT: 25.5 % — ABNORMAL LOW (ref 36.0–46.0)
HCT: 25 % — ABNORMAL LOW (ref 36.0–46.0)
HCT: 24.7 % — ABNORMAL LOW (ref 36.0–46.0)
Hemoglobin: 7.9 g/dL — ABNORMAL LOW (ref 12.0–15.0)
Hemoglobin: 8 g/dL — ABNORMAL LOW (ref 12.0–15.0)
Hemoglobin: 7.8 g/dL — ABNORMAL LOW (ref 12.0–15.0)
Hemoglobin: 7.8 g/dL — ABNORMAL LOW (ref 12.0–15.0)
MCH: 30.7 pg (ref 26.0–34.0)
MCH: 30.4 pg (ref 26.0–34.0)
MCH: 30.1 pg (ref 26.0–34.0)
MCH: 30.4 pg (ref 26.0–34.0)
MCHC: 31.7 g/dL (ref 30.0–36.0)
MCHC: 31.4 g/dL (ref 30.0–36.0)
MCHC: 31.2 g/dL (ref 30.0–36.0)
MCHC: 31.6 g/dL (ref 30.0–36.0)
MCV: 96.9 fL (ref 80.0–100.0)
MCV: 97 fL (ref 80.0–100.0)
MCV: 96.5 fL (ref 80.0–100.0)
MCV: 96.1 fL (ref 80.0–100.0)
Platelets: 249 10*3/uL (ref 150–400)
Platelets: 262 10*3/uL (ref 150–400)
Platelets: 253 10*3/uL (ref 150–400)
Platelets: 261 10*3/uL (ref 150–400)
RBC: 2.57 MIL/uL — ABNORMAL LOW (ref 3.87–5.11)
RBC: 2.63 MIL/uL — ABNORMAL LOW (ref 3.87–5.11)
RBC: 2.59 MIL/uL — ABNORMAL LOW (ref 3.87–5.11)
RBC: 2.57 MIL/uL — ABNORMAL LOW (ref 3.87–5.11)
RDW: 17.3 % — ABNORMAL HIGH (ref 11.5–15.5)
RDW: 17.3 % — ABNORMAL HIGH (ref 11.5–15.5)
RDW: 17.4 % — ABNORMAL HIGH (ref 11.5–15.5)
RDW: 17.2 % — ABNORMAL HIGH (ref 11.5–15.5)
WBC: 7.6 10*3/uL (ref 4.0–10.5)
WBC: 8.6 10*3/uL (ref 4.0–10.5)
WBC: 7.2 10*3/uL (ref 4.0–10.5)
WBC: 8 10*3/uL (ref 4.0–10.5)
nRBC: 1.2 % — ABNORMAL HIGH (ref 0.0–0.2)
nRBC: 0.9 % — ABNORMAL HIGH (ref 0.0–0.2)
nRBC: 1 % — ABNORMAL HIGH (ref 0.0–0.2)
nRBC: 0.5 % — ABNORMAL HIGH (ref 0.0–0.2)

## 2022-08-06 LAB — BASIC METABOLIC PANEL
Anion gap: 6 (ref 5–15)
BUN: 48 mg/dL — ABNORMAL HIGH (ref 8–23)
CO2: 23 mmol/L (ref 22–32)
Calcium: 8.9 mg/dL (ref 8.9–10.3)
Chloride: 107 mmol/L (ref 98–111)
Creatinine, Ser: 1.6 mg/dL — ABNORMAL HIGH (ref 0.44–1.00)
GFR, Estimated: 35 mL/min — ABNORMAL LOW (ref >60)
Glucose, Bld: 101 mg/dL — ABNORMAL HIGH (ref 70–99)
Potassium: 3.8 mmol/L (ref 3.5–5.1)
Sodium: 136 mmol/L (ref 135–145)

## 2022-08-06 NOTE — Progress Notes (Signed)
[facility] Progress Note  SUBJECTIVE:   Interval history: Christine Jacobson was seen and evaluated today at bedside. Resting comfortably in bed in no acute distress. Noted that she is very tired today. She denied chest pain, shortness of breath, abdominal pain. No recent bowel movement.   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   2D ECHO - EF >55%, normal   HTN (hypertension)    Hyperprolactinemia (HCC)    dx in her 18, took meds, self d/c a while back   Liver function test abnormality    normal when repeated   Mild hyperlipidemia    Obesity    Palpitations    negative stress echo in November 2012 with normal LV function; mild LVH, proximal septal thickening with narrow LVOT and mild gradient; mild MR and TR; Cardionet showed PACs in November 2012   Pyoderma gangrenosa    Shortness of breath 07/11/2011   MET TEST   Past Surgical History:  Procedure Laterality Date   CARDIOVERSION N/A 02/01/2022   Procedure: CARDIOVERSION;  Surgeon: Thomasene Ripple, DO;  [facility];  Service: Cardiovascular;  Laterality: N/A;   RIGHT HEART CATH N/A 10/15/2021   Procedure: RIGHT HEART CATH;  Surgeon: Elder Negus, MD;  Location: [facility] INVAS[facility] CV LAB;  Service: Cardiovascular;  Laterality: N/A;   SKIN GRAFT  2006   porcine, R leg   Current Facility-Administered Medications  Medication Dose Route Frequency Provider Last Rate Last Admin   albuterol (PROVENTIL) (2.5 MG/3ML) 0.083% nebulizer solution 2.5 mg  2.5 mg Nebulization Q2H PRN Jacobson Crigler, Mir M, MD       Chlorhexidine Gluconate Cloth 2 % PADS 6 each  6 each Topical Daily Jacobson Crigler, Mir M, MD   6 each at 08/06/22 1006   hydrOXYzine (ATARAX) tablet 10 mg  10 mg Oral TID PRN Jacobson Crigler, Mir M, MD   10  mg at 08/06/22 0800   midodrine (PROAMATINE) tablet 5 mg  5 mg Oral BID WC Jacobson Crigler, Mir M, MD   5 mg at 08/06/22 0800   ondansetron (ZOFRAN) tablet 4 mg  4 mg Oral Q6H PRN Jacobson Crigler, Mir M, MD       Or   ondansetron [facility]) injection 4 mg  4 mg Intravenous Q6H PRN Jacobson Crigler, Mir M, MD   4 mg at 08/05/22 0240   pantoprazole (PROTONIX) injection 40 mg  40 mg Intravenous Q12H Liliane Shi H, DO   40 mg at 08/06/22 0900   traMADol (ULTRAM) tablet 50 mg  50 mg Oral Q8H PRN Jacobson Crigler, Mir M, MD   50 mg at 08/05/22 0154   traZODone (DESYREL) tablet 25 mg  25 mg Oral QHS PRN Maryln Gottron, MD       Allergies as of 08/04/2022 - Review Complete 08/04/2022  Allergen Reaction Noted   Ativan [lorazepam] Other (See Comments) 08/04/2022   Pork-derived products Other (See Comments) 06/03/2022   Tylenol [acetaminophen] Nausea Only and Other (See Comments) 08/04/2022   Review of Systems:  Review of Systems  Respiratory:  Negative for shortness of breath.   Cardiovascular:  Negative for chest pain.  Gastrointestinal:  Negative for abdominal pain, blood in stool, constipation, diarrhea, nausea and vomiting.    OBJECTIVE:   Temp:  [97.4 F (36.3 C)-98.2 F (36.8 C)] 98.2 F (  36.8 C) (07/20 0800) Pulse Rate:  [47-60] 58 (07/20 1200) Resp:  [9-19] 9 (07/20 1200) BP: (104-141)/(54-80) 128/62 (07/20 0900) SpO2:  [91 %-100 %] 96 % (07/20 1200) Last BM Date : 08/05/22 Physical Exam Constitutional:      General: She is not in acute distress.    Appearance: She is not ill-appearing, toxic-appearing or diaphoretic.  Cardiovascular:     Rate and Rhythm: Regular rhythm. Bradycardia present.  Pulmonary:     Effort: No respiratory distress.     Breath sounds: Normal breath sounds.     Comments: Supplemental oxygen nasal cannula 2 liters. Abdominal:     General: Bowel sounds are normal. There is no distension.     Palpations: Abdomen is soft.     Tenderness: There is no abdominal tenderness.  There is no guarding.  Neurological:     Mental Status: She is alert.     Labs: Recent Labs    08/05/22 2000 08/06/22 0239 08/06/22 0955  WBC 8.1 7.6 8.6  HGB 6.8* 7.9* 8.0*  HCT 22.9* 24.9* 25.5*  PLT 260 249 262   BMET Recent Labs    08/04/22 1430 08/05/22 0657 08/06/22 0239  NA 139 137 136  K 4.5 4.5 3.8  CL 109 108 107  CO2 20* 20* 23  GLUCOSE 110* 106* 101*  BUN 49* 51* 48*  CREATININE 1.90* 1.94* 1.60*  CALCIUM 9.3 9.0 8.9   LFT Recent Labs    08/05/22 0657  PROT 5.5*  ALBUMIN 2.9*  AST 37  ALT 19  ALKPHOS 138*  BILITOT 1.0   PT/INR Recent Labs    08/04/22 1430  LABPROT 29.1*  INR 2.7*   Diagnostic imaging: DG Chest Port 1 View  Result Date: 08/04/2022 CLINICAL DATA:  Chest pain. EXAM: PORTABLE CHEST 1 VIEW COMPARISON:  One-view chest x-ray 07/16/2022 FINDINGS: The heart is enlarged. Mild pulmonary vascular congestion is present without frank edema. No focal airspace disease present. No effusions are present. The visualized soft tissues bony thorax are normal. IMPRESSION: Cardiomegaly and mild pulmonary vascular congestion without frank edema. Electronically Signed   By: Marin Roberts M.D.   On: 08/04/2022 14:48    IMPRESSION: Normocytic anemia, iron studies 07/17/2022 unremarkable  Fecal occult positive stool, concern for melena from ER provider, no recent bowel movement Generalized weakness secondary to #1 History hypertrophic obstructive cardiomyopathy Atrial fibrillation, on Eliquis prior to admission             -Last dose 08/04/22 AM Low folate on iron studies 07/17/2022  PLAN: -Continue IV PPI Q12Hr -Trend H/H, transfuse for Hgb < 7 -Remains bradycardic on telemetry -No recent bowel movement or further signs of GI blood loss -Ok for diet, trend bowel movements and Hgb today -Limited endoscopy and anesthesia availability over weekend, EGD not urgent at this time [facility] will follow   LOS: 2 days   Liliane Shi, [facility]  Gastroenterology

## 2022-08-06 NOTE — Progress Notes (Signed)
This am while patient was asleep, oxygen saturation decreased 65-70%. Upon waking O2 sat increased 96 %. Pt remains alert, oriented x4. Respirations are equal 16 and non-labored. Nasal Cannula 2 lt applied, sat 100 %.

## 2022-08-06 NOTE — Progress Notes (Signed)
  Progress Note   Patient: Christine Jacobson ZOX:[id number] DOB: 1955-06-26 DOA: 08/04/2022     2 DOS: the patient was seen and examined on 08/06/2022   Brief hospital course: 67yow complicated PMH including afib on apixaban, recent discharge 7/4 after tx for LE cellultis, presented w/ weaness, found to have low BP, severe anemia. Admitted for GIB and further evaluation.  Consultants GI  Procedures   Assessment and Plan: Acute blood loss anemia Secondary to GIB Complicated by anticoagulation (apixaban) Associated with hypotension Severe anemia w/ Hgb <6 on admission. Does not check stools, was unaware of bleed; no previous hx known. Uses ibuprofen 2 tabs every other day for low back pain. No charted bleeding. Hemodynamics stable. Currently s/p 3 units PRBC. Hgb up to 7.9 s/p 1 unit overnight. Trend Hgb Continue IV PPI daily EGD as per GI   AKI CKD stage IIIa Secondary to hypovolemia, hypotension secondary to ABLA, diuretic Improving. Creatinine 1.9 >> 1.6 today (baseline 1.0-1.2) Trend BMP, continue IVF   Chronic diastolic congestive heart failure BNP 1630 (baseline [phone]) Troponins negative. Elevated BNP, some congestion on CXR, but clinically stable and dry Last echo 2019 w/ normal LVEF, grade 2 diastolic dysfunction Resume metoprolol when BP allows   Atrial fibrillation Acquired thrombophilia On metoprolol and Eliquis at home, currently in normal sinus rhythm Continue BB when BP allows Holding apixaban for above   Bilateral lower extremity cellulitis  Right lower extremity wound Resolved. Legs look good per WOC RN   Essential hypertension BP stable    Class II obesity Body mass index is 33.64 kg/m.      Subjective:  Hgb low overnight, given 1 unit PRBC Feels ok, no known bleeding No CP, no SOB  Physical Exam: Vitals:   08/06/22 0500 08/06/22 0600 08/06/22 0700 08/06/22 0800  BP: (!) 108/56 116/70 138/72 138/72  Pulse: (!) 52 (!) 53 (!) 51 (!) 54  Resp:  10 11 11 19   Temp:      TempSrc:      SpO2: 94% 93% 98% 98%  Weight:      Height:       Physical Exam Vitals reviewed.  Constitutional:      General: She is not in acute distress.    Appearance: She is not ill-appearing or toxic-appearing.  Cardiovascular:     Rate and Rhythm: Normal rate and regular rhythm.     Heart sounds: No murmur heard. Pulmonary:     Effort: Pulmonary effort is normal. No respiratory distress.     Breath sounds: No wheezing, rhonchi or rales.  Abdominal:     Palpations: Abdomen is soft.  Neurological:     Mental Status: She is alert.  Psychiatric:        Mood and Affect: Mood normal.        Behavior: Behavior normal.     Data Reviewed: Creatinine down to 1.6 Hgb 6.9 > 7.9 s/p 1 unit PRBC  Family Communication: none  Disposition: Status is: Inpatient Remains inpatient appropriate because: GIB     Time spent: 20 minutes  Author: Brendia Sacks, MD 08/06/2022 9:40 AM  For on call review [email or web address].

## 2022-08-07 DIAGNOSIS — I4819 Other persistent atrial fibrillation: Secondary | ICD-10-CM | POA: Diagnosis not present

## 2022-08-07 DIAGNOSIS — K922 Gastrointestinal hemorrhage, unspecified: Secondary | ICD-10-CM

## 2022-08-07 DIAGNOSIS — D62 Acute posthemorrhagic anemia: Secondary | ICD-10-CM | POA: Diagnosis not present

## 2022-08-07 DIAGNOSIS — N179 Acute kidney failure, unspecified: Secondary | ICD-10-CM | POA: Diagnosis not present

## 2022-08-07 LAB — BASIC METABOLIC PANEL
Anion gap: 7 (ref 5–15)
BUN: 39 mg/dL — ABNORMAL HIGH (ref 8–23)
CO2: 23 mmol/L (ref 22–32)
Calcium: 9.1 mg/dL (ref 8.9–10.3)
Chloride: 109 mmol/L (ref 98–111)
Creatinine, Ser: 1.45 mg/dL — ABNORMAL HIGH (ref 0.44–1.00)
GFR, Estimated: 39 mL/min — ABNORMAL LOW (ref >60)
Glucose, Bld: 113 mg/dL — ABNORMAL HIGH (ref 70–99)
Potassium: 4.1 mmol/L (ref 3.5–5.1)
Sodium: 139 mmol/L (ref 135–145)

## 2022-08-07 LAB — CBC
HCT: 25.3 % — ABNORMAL LOW (ref 36.0–46.0)
Hemoglobin: 7.9 g/dL — ABNORMAL LOW (ref 12.0–15.0)
MCH: 30.3 pg (ref 26.0–34.0)
MCHC: 31.2 g/dL (ref 30.0–36.0)
MCV: 96.9 fL (ref 80.0–100.0)
Platelets: 260 10*3/uL (ref 150–400)
RBC: 2.61 MIL/uL — ABNORMAL LOW (ref 3.87–5.11)
RDW: 17.6 % — ABNORMAL HIGH (ref 11.5–15.5)
WBC: 8 10*3/uL (ref 4.0–10.5)
nRBC: 0.6 % — ABNORMAL HIGH (ref 0.0–0.2)

## 2022-08-07 MED ORDER — ACETAMINOPHEN 325 MG PO TABS
650.0000 mg | ORAL_TABLET | Freq: Four times a day (QID) | ORAL | Status: DC | PRN
  Administered 2022-08-07 – 2022-08-10 (×3): 650 mg via ORAL
  Filled 2022-08-07 (×3): qty 2

## 2022-08-07 MED ORDER — LACTATED RINGERS IV SOLN: INTRAVENOUS | Status: DC

## 2022-08-07 MED ORDER — POLYETHYLENE GLYCOL 3350 17 G PO PACK
17.0000 g | PACK | Freq: Every day | ORAL | Status: DC
  Administered 2022-08-07 (×2): 17 g via ORAL
  Filled 2022-08-07 (×3): qty 1

## 2022-08-07 MED ORDER — METOPROLOL SUCCINATE ER 25 MG PO TB24
25.0000 mg | ORAL_TABLET | Freq: Every day | ORAL | Status: DC
  Administered 2022-08-08 – 2022-08-13 (×5): 25 mg via ORAL
  Filled 2022-08-07 (×7): qty 1

## 2022-08-07 NOTE — TOC Initial Note (Signed)
Transition of Care [facility]) - Initial/Assessment Note    Patient Details  Name: Christine Jacobson MRN: [id number] Date of Birth: 12/03/1955  Transition of Care [facility]) CM/SW Contact:    Otelia Santee, LCSW Phone Number: 08/07/2022, 11:12 AM  Clinical Narrative:                 Pt from home alone. Pt is currently active with [facility] for HHPT and RN for woc. ROC orders will need to be placed for [facility] prior to discharge. TOC will continue to follow for further TOC needs.   Expected Discharge Plan: Home w Home Health Services Barriers to Discharge: Continued Medical Work up   Patient Goals and CMS Choice Patient states their goals for this hospitalization and ongoing recovery are:: Unable to assess CMS Medicare.gov Compare Post Acute Care list provided to:: Patient Choice offered to / list presented to : Patient [place] ownership interest in [facility].provided to::  (NA)    Expected Discharge Plan and Services In-house Referral: NA Discharge Planning Services: NA Post Acute Care Choice: Home Health, Resumption of Svcs/PTA Provider Living arrangements for the past 2 months: Single Family Home                 DME Arranged: N/A DME Agency: NA                  Prior Living Arrangements/Services Living arrangements for the past 2 months: Single Family Home Lives with:: Self Patient language and need for interpreter reviewed:: Yes Do you feel safe going back to the place where you live?: Yes      Need for Family Participation in Patient Care: No (Comment) Care giver support system in place?: Yes (comment) Current home services: DME (RW) Criminal Activity/Legal Involvement Pertinent to Current Situation/Hospitalization: No - Comment as needed  Activities of Daily Living Home Assistive Devices/Equipment: Environmental consultant (specify type) ADL Screening (condition at time of admission) Patient's cognitive ability adequate to safely complete daily activities?: Yes Is the patient  deaf or have difficulty hearing?: No Does the patient have difficulty seeing, even when wearing glasses/contacts?: No Does the patient have difficulty concentrating, remembering, or making decisions?: No Patient able to express need for assistance with ADLs?: Yes Does the patient have difficulty dressing or bathing?: No Independently performs ADLs?: Yes (appropriate for developmental age) Does the patient have difficulty walking or climbing stairs?: Yes Weakness of Legs: Both Weakness of Arms/Hands: None  Permission Sought/Granted   Permission granted to share information with : No              Emotional Assessment   Attitude/Demeanor/Rapport: Unable to Assess Affect (typically observed): Unable to Assess Orientation: : Oriented to Self, Oriented to Place, Oriented to  Time, Oriented to Situation Alcohol / Substance Use: Not Applicable Psych Involvement: No (comment)  Admission diagnosis:  Acute blood loss anemia [D62] Patient Active Problem List   Diagnosis Date Noted   GIB (gastrointestinal bleeding) 08/05/2022   Acquired thrombophilia (HCC) 08/05/2022   Chronic anticoagulation 08/05/2022   Acute blood loss anemia 08/04/2022   Cellulitis of both lower extremities 07/13/2022   Hypocarbia 07/13/2022   Normocytic anemia 07/13/2022   Cellulitis 06/02/2022   Sepsis due to cellulitis (HCC) 06/02/2022   AKI (acute kidney injury) (HCC) 06/02/2022   Hyponatremia 06/02/2022   Dehydration 06/02/2022   Metabolic acidosis 06/02/2022   Other secondary pulmonary hypertension (HCC)    Cellulitis and abscess of left lower extremity 10/13/2021   Persistent atrial fibrillation (  HCC) 07/28/2021   Obesity (BMI 30-39.9)    Liver function test abnormality    Primary hypertension    Chronic diastolic heart failure (HCC) 12/15/2016   HOCM (hypertrophic obstructive cardiomyopathy) (HCC) 01/22/2016   Lower extremity edema 09/17/2014   History of syncope 12/05/2013   Syncope 12/05/2013    Pneumonia 11/23/2012   Shortness of breath 07/11/2011   Hypertension 11/23/2010   Chest pain 10/18/2010   Murmur 10/18/2010   Palpitations 09/23/2010   Mild hyperlipidemia    Elevated BP    Hyperprolactinemia (HCC)    Pyoderma gangrenosa    PCP:  Karenann Cai, NP Pharmacy:   [facility] [id number] - [place], [place] - [phone] [place] [address] Phone: [phone] Fax: [phone]     Social Determinants of Health (SDOH) Social History: SDOH Screenings   Food Insecurity: No Food Insecurity (08/04/2022)  Recent Concern: Food Insecurity - Food Insecurity Present (06/02/2022)  Housing: Low Risk  (08/04/2022)  Transportation Needs: No Transportation Needs (08/04/2022)  Utilities: Not At Risk (08/04/2022)  Depression (PHQ2-9): Low Risk  (10/26/2021)  Financial Resource Strain: Low Risk  (12/03/2021)  Tobacco Use: Medium Risk (08/05/2022)   SDOH Interventions:     Readmission Risk Interventions    08/07/2022   11:10 AM 07/14/2022    2:40 PM  Readmission Risk Prevention Plan  Post Dischage Appt  Complete  Medication Screening  Complete  Transportation Screening Complete   PCP or Specialist Appt within 5-7 Days Complete   Home Care Screening Complete   Medication Review (RN CM) Complete

## 2022-08-07 NOTE — Progress Notes (Signed)
  Progress Note   Patient: Christine Jacobson XBM:[id number] DOB: 28-Mar-1955 DOA: 08/04/2022     3 DOS: the patient was seen and examined on 08/07/2022   Brief hospital course: 67yow complicated PMH including afib on apixaban, recent discharge 7/4 after tx for LE cellultis, presented w/ weaness, found to have low BP, severe anemia. Admitted for GIB and further evaluation.  Consultants GI  Procedures   Assessment and Plan: Acute blood loss anemia Secondary to GIB Complicated by anticoagulation (apixaban) Associated with hypotension Severe anemia w/ Hgb <6 on admission. Was unaware of bleed; no previous hx known. Uses ibuprofen 2 tabs every other day for low back pain. No charted bleeding. Hemodynamics stable. Currently Hgb stable at 7.9 s/p total 3 units PRBC.   Continue IV PPI Q12 EGD as per GI   AKI CKD stage IIIa Secondary to hypovolemia, hypotension secondary to ABLA, diuretic Improving. Creatinine 1.9 >> 1.6 > 1.45 today (baseline 1.0-1.2) Trend BMP, continue IVF   Chronic diastolic congestive heart failure BNP 1630 (baseline [phone]) Troponins negative. Elevated BNP, some congestion on CXR, but clinically stable   Last echo 2019 w/ normal LVEF, grade 2 diastolic dysfunction Resume metoprolol when BP allows   Atrial fibrillation Acquired thrombophilia On metoprolol and Eliquis at home, currently in normal sinus rhythm Resume BB at lower dose Holding apixaban for above   Bilateral lower extremity cellulitis  Right lower extremity wound Resolved. Legs look good per WOC RN   Essential hypertension BP stable    Class II obesity Body mass index is 33.64 kg/m.  Overall stable clinically >24 hours. Transfer upstairs. Home after EGD if unremarkable.     Subjective:  No problems overnight No pain No SOB Tolerating diet  Physical Exam: Vitals:   08/07/22 0400 08/07/22 0500 08/07/22 0600 08/07/22 0700  BP: 115/63 126/66 (!) 115/93 (!) 109/57  Pulse: 69 66 72 68   Resp: 15 14 18 19   Temp:      TempSrc:      SpO2: (!) 84% 94% 93% 92%  Weight:  91.1 kg    Height:       Physical Exam Vitals reviewed.  Constitutional:      General: She is not in acute distress.    Appearance: She is not ill-appearing or toxic-appearing.  Cardiovascular:     Rate and Rhythm: Normal rate and regular rhythm.     Heart sounds: No murmur heard. Pulmonary:     Effort: Pulmonary effort is normal. No respiratory distress.     Breath sounds: No wheezing, rhonchi or rales.  Neurological:     Mental Status: She is alert.  Psychiatric:        Mood and Affect: Mood normal.        Behavior: Behavior normal.     Data Reviewed: {AFVSS Creatinine trending down 1.45 Hgb stable 48 hours, 7.9  Family Communication: none  Disposition: Status is: Inpatient Remains inpatient appropriate because: GIB, ABLA     Time spent: 20 minutes  Author: Brendia Sacks, MD 08/07/2022 9:10 AM  For on call review [email or web address].

## 2022-08-07 NOTE — Progress Notes (Signed)
[facility] Progress Note  SUBJECTIVE:   Interval history: Christine Jacobson was seen and evaluated today at bedside. Resting comfortably in bed. Noted that she ate breakfast. No nausea or vomiting. No abdominal pain. No recent bowel movement, Miralax was added this AM and she is drinking it.  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   2D ECHO - EF >55%, normal   HTN (hypertension)    Hyperprolactinemia (HCC)    dx in her 18, took meds, self d/c a while back   Liver function test abnormality    normal when repeated   Mild hyperlipidemia    Obesity    Palpitations    negative stress echo in November 2012 with normal LV function; mild LVH, proximal septal thickening with narrow LVOT and mild gradient; mild MR and TR; Cardionet showed PACs in November 2012   Pyoderma gangrenosa    Shortness of breath 07/11/2011   MET TEST   Past Surgical History:  Procedure Laterality Date   CARDIOVERSION N/A 02/01/2022   Procedure: CARDIOVERSION;  Surgeon: Thomasene Ripple, DO;  Location: [facility];  Service: Cardiovascular;  Laterality: N/A;   RIGHT HEART CATH N/A 10/15/2021   Procedure: RIGHT HEART CATH;  Surgeon: Elder Negus, MD;  Location: [facility] INVASIVE CV LAB;  Service: Cardiovascular;  Laterality: N/A;   SKIN GRAFT  2006   porcine, R leg   Current Facility-Administered Medications  Medication Dose Route Frequency Provider Last Rate Last Admin   albuterol (PROVENTIL) (2.5 MG/3ML) 0.083% nebulizer solution 2.5 mg  2.5 mg Nebulization Q2H PRN Kirby Crigler, Mir M, MD       Chlorhexidine Gluconate Cloth 2 % PADS 6 each  6 each Topical Daily Kirby Crigler, Mir M, MD   6 each at 08/06/22 1006   hydrOXYzine (ATARAX) tablet 10 mg  10 mg Oral TID PRN Maryln Gottron, MD    10 mg at 08/06/22 0800   lactated ringers infusion   Intravenous Continuous Standley Brooking, MD       metoprolol succinate (TOPROL-XL) 24 hr tablet 25 mg  25 mg Oral Daily Standley Brooking, MD       midodrine (PROAMATINE) tablet 5 mg  5 mg Oral BID WC Kirby Crigler, Mir M, MD   5 mg at 08/07/22 0748   ondansetron (ZOFRAN) tablet 4 mg  4 mg Oral Q6H PRN Kirby Crigler, Mir M, MD       Or   ondansetron [facility]) injection 4 mg  4 mg Intravenous Q6H PRN Kirby Crigler, Mir M, MD   4 mg at 08/05/22 0240   pantoprazole (PROTONIX) injection 40 mg  40 mg Intravenous Q12H Liliane Shi H, DO   40 mg at 08/07/22 0841   polyethylene glycol (MIRALAX / GLYCOLAX) packet 17 g  17 g Oral Daily Liliane Shi H, DO   17 g at 08/07/22 5284   traMADol (ULTRAM) tablet 50 mg  50 mg Oral Q8H PRN Maryln Gottron, MD   50 mg at 08/07/22 0841   traZODone (DESYREL) tablet 25 mg  25 mg Oral QHS PRN Maryln Gottron, MD       Allergies as of 08/04/2022 - Review Complete 08/04/2022  Allergen Reaction Noted   Ativan [lorazepam] Other (See Comments) 08/04/2022   Pork-derived products Other (See Comments) 06/03/2022  Tylenol [acetaminophen] Nausea Only and Other (See Comments) 08/04/2022   Review of Systems:  Review of Systems  Gastrointestinal:  Negative for abdominal pain, blood in stool, nausea and vomiting.    OBJECTIVE:   Temp:  [97.3 F (36.3 C)-98.3 F (36.8 C)] 97.3 F (36.3 C) (07/21 0800) Pulse Rate:  [46-79] 77 (07/21 0900) Resp:  [8-22] 17 (07/21 0900) BP: (102-139)/(36-93) 119/58 (07/21 0900) SpO2:  [84 %-100 %] 91 % (07/21 0900) Weight:  [91.1 kg] 91.1 kg (07/21 0500) Last BM Date : 08/05/22 Physical Exam Constitutional:      General: She is not in acute distress.    Appearance: She is not ill-appearing, toxic-appearing or diaphoretic.  Cardiovascular:     Rate and Rhythm: Normal rate and regular rhythm.  Pulmonary:     Effort: No respiratory distress.     Breath sounds: Normal breath  sounds.  Abdominal:     General: Bowel sounds are normal. There is no distension.     Palpations: Abdomen is soft.     Tenderness: There is no abdominal tenderness. There is no guarding.  Neurological:     Mental Status: She is alert.     Labs: Recent Labs    08/06/22 1505 08/06/22 2213 08/07/22 0239  WBC 7.2 8.0 8.0  HGB 7.8* 7.8* 7.9*  HCT 25.0* 24.7* 25.3*  PLT 253 261 260   BMET Recent Labs    08/05/22 0657 08/06/22 0239 08/07/22 0239  NA 137 136 139  K 4.5 3.8 4.1  CL 108 107 109  CO2 20* 23 23  GLUCOSE 106* 101* 113*  BUN 51* 48* 39*  CREATININE 1.94* 1.60* 1.45*  CALCIUM 9.0 8.9 9.1   LFT Recent Labs    08/05/22 0657  PROT 5.5*  ALBUMIN 2.9*  AST 37  ALT 19  ALKPHOS 138*  BILITOT 1.0   PT/INR Recent Labs    08/04/22 1430  LABPROT 29.1*  INR 2.7*   Diagnostic imaging: No results found.  IMPRESSION: Normocytic anemia, iron studies 07/17/2022 unremarkable  Fecal occult positive stool, concern for melena from ER provider, no bowel movement since admit Generalized weakness secondary to #1 History hypertrophic obstructive cardiomyopathy Atrial fibrillation, on Eliquis prior to admission             -Last dose 08/04/22 AM Low folate on iron studies 07/17/2022  PLAN: -No signs of GI bleeding -Tolerating diet -Hgb stable -Start Miralax 17 gm PO daily, monitor bowel movements -Ok to resume anticoagulant from GI standpoint -[facility] will be available as needed, please recall if signs of GI bleeding   LOS: 3 days   Liliane Shi, DO [facility]

## 2022-08-08 DIAGNOSIS — I4891 Unspecified atrial fibrillation: Secondary | ICD-10-CM | POA: Diagnosis not present

## 2022-08-08 DIAGNOSIS — I4819 Other persistent atrial fibrillation: Secondary | ICD-10-CM | POA: Diagnosis not present

## 2022-08-08 DIAGNOSIS — D6869 Other thrombophilia: Secondary | ICD-10-CM | POA: Diagnosis not present

## 2022-08-08 DIAGNOSIS — I5030 Unspecified diastolic (congestive) heart failure: Secondary | ICD-10-CM | POA: Diagnosis not present

## 2022-08-08 DIAGNOSIS — N179 Acute kidney failure, unspecified: Secondary | ICD-10-CM | POA: Diagnosis not present

## 2022-08-08 DIAGNOSIS — Z01810 Encounter for preprocedural cardiovascular examination: Secondary | ICD-10-CM | POA: Diagnosis not present

## 2022-08-08 DIAGNOSIS — D62 Acute posthemorrhagic anemia: Secondary | ICD-10-CM | POA: Diagnosis not present

## 2022-08-08 LAB — BASIC METABOLIC PANEL
Anion gap: 5 (ref 5–15)
BUN: 26 mg/dL — ABNORMAL HIGH (ref 8–23)
CO2: 23 mmol/L (ref 22–32)
Calcium: 8.7 mg/dL — ABNORMAL LOW (ref 8.9–10.3)
Chloride: 110 mmol/L (ref 98–111)
Creatinine, Ser: 1.15 mg/dL — ABNORMAL HIGH (ref 0.44–1.00)
GFR, Estimated: 52 mL/min — ABNORMAL LOW (ref >60)
Glucose, Bld: 96 mg/dL (ref 70–99)
Potassium: 3.6 mmol/L (ref 3.5–5.1)
Sodium: 138 mmol/L (ref 135–145)

## 2022-08-08 LAB — CBC
HCT: 24.8 % — ABNORMAL LOW (ref 36.0–46.0)
Hemoglobin: 7.7 g/dL — ABNORMAL LOW (ref 12.0–15.0)
MCH: 30 pg (ref 26.0–34.0)
MCHC: 31 g/dL (ref 30.0–36.0)
MCV: 96.5 fL (ref 80.0–100.0)
Platelets: 257 10*3/uL (ref 150–400)
RBC: 2.57 MIL/uL — ABNORMAL LOW (ref 3.87–5.11)
RDW: 17.4 % — ABNORMAL HIGH (ref 11.5–15.5)
WBC: 7.5 10*3/uL (ref 4.0–10.5)
nRBC: 0.3 % — ABNORMAL HIGH (ref 0.0–0.2)

## 2022-08-08 MED ORDER — FUROSEMIDE 10 MG/ML IJ SOLN
20.0000 mg | Freq: Once | INTRAMUSCULAR | Status: AC
  Administered 2022-08-08: 20 mg via INTRAVENOUS
  Filled 2022-08-08: qty 2

## 2022-08-08 MED ORDER — PANTOPRAZOLE SODIUM 40 MG PO TBEC: 40.0000 mg | DELAYED_RELEASE_TABLET | Freq: Two times a day (BID) | ORAL | Status: DC

## 2022-08-08 MED ORDER — PANTOPRAZOLE SODIUM 40 MG PO TBEC
40.0000 mg | DELAYED_RELEASE_TABLET | Freq: Two times a day (BID) | ORAL | Status: DC
  Administered 2022-08-08 – 2022-08-14 (×11): 40 mg via ORAL
  Filled 2022-08-08 (×11): qty 1

## 2022-08-08 MED ORDER — FUROSEMIDE 20 MG PO TABS: 20.0000 mg | ORAL_TABLET | Freq: Every day | ORAL | Status: DC

## 2022-08-08 NOTE — Consult Note (Signed)
Cardiology Consultation   Patient ID: Christine Jacobson MRN: [id number]; DOB: 1955/04/29  Admit date: 08/04/2022 Date of Consult: 08/08/2022  PCP:  Karenann Cai, NP   [facility] Providers Cardiologist:  Thomasene Ripple, DO        Patient Profile:   Christine Jacobson is a 67 y.o. female with a hx of HCM who is being seen 08/08/2022 for the evaluation of pre op risk at the request of Dr. Dulce Sellar.  History of Present Illness:   Christine Jacobson is 88 here with GIB, Hgb < 5.4 on arrival s/p 3 units PRBC now 7.9 with history of AFIB on eliquis. Came in weak, progressive. Low BP. Dizzy.   HCM - gradient 64 mmHg on ECHO. No current evidence of obstructive symptoms  Has had LE cellulitis.   Comfortable in chair. No significant SOB, no CP. No syncope.  Does take NSAIDS, occasional ETOH, wine.   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   2D ECHO - EF >55%, normal   HTN (hypertension)    Hyperprolactinemia (HCC)    dx in her 18, took meds, self d/c a while back   Liver function test abnormality    normal when repeated   Mild hyperlipidemia    Obesity    Palpitations    negative stress echo in November 2012 with normal LV function; mild LVH, proximal septal thickening with narrow LVOT and mild gradient; mild MR and TR; Cardionet showed PACs in November 2012   Pyoderma gangrenosa    Shortness of breath 07/11/2011   MET TEST    Past Surgical History:  Procedure Laterality Date   CARDIOVERSION N/A 02/01/2022   Procedure: CARDIOVERSION;  Surgeon: Thomasene Ripple, DO;  Location: [facility];  Service: Cardiovascular;  Laterality: N/A;   RIGHT HEART CATH N/A 10/15/2021   Procedure: RIGHT HEART CATH;  Surgeon: Elder Negus, MD;  Location: [facility] INVASIVE CV  LAB;  Service: Cardiovascular;  Laterality: N/A;   SKIN GRAFT  2006   porcine, R leg     Home Medications:  Prior to Admission medications   Medication Sig Start Date End Date Taking? Authorizing Provider  apixaban (ELIQUIS) 5 MG TABS tablet Take 1 tablet (5 mg total) by mouth 2 (two) times daily. 02/23/22  Yes Tobb, Kardie, DO  ascorbic acid (VITAMIN C) 500 MG tablet Take 1 tablet (500 mg total) by mouth daily for 30 days 06/10/22  Yes Leroy Sea, MD  furosemide (LASIX) 40 MG tablet Take 20 mg by mouth See admin instructions. Take 20 mg by mouth one to two times a day   Yes [provider]  Ibuprofen 200 MG CAPS Take 200-400 mg by mouth every 6 (six) hours as needed (for pain or headaches).   Yes [provider]  metoprolol succinate (TOPROL-XL) 100 MG 24 hr tablet TAKE ONE TABLET BY MOUTH TWICE A DAY WITH OR IMMEDIATELY FOLLOWING A MEAL Patient taking differently: Take 100 mg by mouth 2 (two) times daily. 03/02/22  Yes Tobb, Kardie, DO  mineral oil-hydrophilic petrolatum (AQUAPHOR) ointment Apply topically as needed for dry skin. Use with Unna boots please at the back of the wounds on the lower extremities when the Unna boots are changed 07/21/22  Yes Rhetta Mura, MD  potassium chloride SA (KLOR-CON  M) 20 MEQ tablet Take 2 tablets (40 mEq total) by mouth 2 (two) times daily. Patient taking differently: Take 20 mEq by mouth 2 (two) times daily. 07/21/22  Yes Rhetta Mura, MD  Zinc Sulfate 220 (50 Zn) MG TABS Take 1 tablet (220 mg total) by mouth daily for 30 days 06/10/22  Yes Leroy Sea, MD  cephALEXin (KEFLEX) 500 MG capsule Take 1 capsule (500 mg total) by mouth every 12 (twelve) hours. Patient not taking: Reported on 08/04/2022 07/21/22   Rhetta Mura, MD  hydrOXYzine (ATARAX) 10 MG tablet Take 1 tablet (10 mg total) by mouth 3 (three) times daily as needed for anxiety. Patient not taking: Reported on 08/04/2022 07/21/22   Rhetta Mura, MD   midodrine (PROAMATINE) 5 MG tablet Take 1 tablet (5 mg total) by mouth 2 (two) times daily with a meal. 06/09/22   Leroy Sea, MD  polyethylene glycol powder (GLYCOLAX/MIRALAX) 17 GM/SCOOP powder Take 1 capful (17 g) by mouth daily as needed for mild constipation. Patient not taking: Reported on 08/04/2022 06/09/22   Leroy Sea, MD    Inpatient Medications: Scheduled Meds:  Chlorhexidine Gluconate Cloth  6 each Topical Daily   [START ON 08/09/2022] furosemide  20 mg Oral Daily   metoprolol succinate  25 mg Oral Daily   midodrine  5 mg Oral BID WC   pantoprazole  40 mg Oral BID   polyethylene glycol  17 g Oral Daily   Continuous Infusions:  PRN Meds: acetaminophen, albuterol, hydrOXYzine, ondansetron **OR** ondansetron (ZOFRAN) IV, traMADol, traZODone  Allergies:    Allergies  Allergen Reactions   Ativan [Lorazepam] Other (See Comments)    "Is not effective"   Pork-Derived Products Other (See Comments)    Religious preference   Tylenol [Acetaminophen] Nausea Only and Other (See Comments)    Upsets the stomach    Social History:   Social History   Socioeconomic History   Marital status: Single    Spouse name: Not on file   Number of children: 0   Years of education: Not on file   Highest education level: Not on file  Occupational History   Occupation: para Psychologist, forensic, part time cook  Tobacco Use   Smoking status: Former    Current packs/day: 1.00    Average packs/day: 1 pack/day for 4.0 years (4.0 ttl pk-yrs)    Types: Cigarettes   Smokeless tobacco: Never  Vaping Use   Vaping status: Never Used  Substance and Sexual Activity   Alcohol use: Not Currently    Alcohol/week: 1.0 standard drink of alcohol    Types: 1 Glasses of wine per week    Comment: "I do have my wine" every day, has cut down from 2 bottles at a time, currently 2-3 glasses   Drug use: No   Sexual activity: Not Currently  Other Topics Concern   Not on file  Social History  Narrative   Lives by self   Social Determinants of Health   Financial Resource Strain: Low Risk  (12/03/2021)   Overall Financial Resource Strain (CARDIA)    Difficulty of Paying Living Expenses: Not very hard  Food Insecurity: No Food Insecurity (08/04/2022)   Hunger Vital Sign    Worried About Running Out of Food in the Last Year: Never true    Ran Out of Food in the Last Year: Never true  Recent Concern: Food Insecurity - Food Insecurity Present (06/02/2022)   Hunger Vital Sign    Worried About Running  Out of Food in the Last Year: Sometimes true    Ran Out of Food in the Last Year: Sometimes true  Transportation Needs: No Transportation Needs (08/04/2022)   PRAPARE - Administrator, Civil Service (Medical): No    Lack of Transportation (Non-Medical): No  Physical Activity: Not on file  Stress: Not on file  Social Connections: Not on file  Intimate Partner Violence: Not At Risk (08/04/2022)   Humiliation, Afraid, Rape, and Kick questionnaire    Fear of Current or Ex-Partner: No    Emotionally Abused: No    Physically Abused: No    Sexually Abused: No    Family History:    Family History  Problem Relation Age of Onset   Heart disease Mother        endocarditis   Ovarian cancer Mother    Emphysema Father    Coronary artery disease Other        F in his 64   Diabetes Other        GP   Cancer Neg Hx      ROS:  Please see the history of present illness.   All other ROS reviewed and negative.     Physical Exam/Data:   Vitals:   08/07/22 1528 08/07/22 2000 08/08/22 0631 08/08/22 1308  BP: (!) 116/57 122/72 126/68 102/67  Pulse: 81 69 87 68  Resp:  17 18 18   Temp: 98 F (36.7 C) 98.2 F (36.8 C) 98.4 F (36.9 C) 98 F (36.7 C)  TempSrc: Oral Oral Oral Oral  SpO2: 93% 96% 93% 99%  Weight:      Height:        Intake/Output Summary (Last 24 hours) at 08/08/2022 1456 Last data filed at 08/08/2022 1253 Gross per 24 hour  Intake 240 ml  Output 850 ml   Net -610 ml      08/07/2022    5:00 AM 08/04/2022    6:55 AM 07/17/2022    4:46 AM  Last 3 Weights  Weight (lbs) 200 lb 13.4 oz 195 lb 15.8 oz 205 lb 4 oz  Weight (kg) 91.1 kg 88.9 kg 93.1 kg     Body mass index is 34.47 kg/m.  General:  Well nourished, well developed, in no acute distress HEENT: normal Neck: no JVD Vascular: No carotid bruits; Distal pulses 2+ bilaterally Cardiac:  normal S1, S2; RRR; 3/6 systolic murmur  Lungs:  clear to auscultation bilaterally, no wheezing, rhonchi or rales  Abd: soft, nontender, no hepatomegaly  Ext: chronic BLE edema Musculoskeletal:  No deformities, BUE and BLE strength normal and equal Skin: warm and dry  Neuro:  CNs 2-12 intact, no focal abnormalities noted Psych:  Normal affect   EKG:  The EKG was personally reviewed and demonstrates:  SB 56 NSSTW changes Telemetry:  Telemetry was personally reviewed and demonstrates:  No adverse rhythm  Relevant CV Studies: ECHO as above  Laboratory Data:  High Sensitivity Troponin:   Recent Labs  Lab 08/04/22 1430 08/04/22 1530  TROPONINIHS 15 13     Chemistry Recent Labs  Lab 08/06/22 0239 08/07/22 0239 08/08/22 0502  NA 136 139 138  K 3.8 4.1 3.6  CL 107 109 110  CO2 23 23 23   GLUCOSE 101* 113* 96  BUN 48* 39* 26*  CREATININE 1.60* 1.45* 1.15*  CALCIUM 8.9 9.1 8.7*  GFRNONAA 35* 39* 52*  ANIONGAP 6 7 5     Recent Labs  Lab 08/04/22 1430 08/05/22 0657  PROT  6.2* 5.5*  ALBUMIN 3.3* 2.9*  AST 15 37  ALT 15 19  ALKPHOS 153* 138*  BILITOT 1.0 1.0   Lipids No results for input(s): "CHOL", "TRIG", "HDL", "LABVLDL", "LDLCALC", "CHOLHDL" in the last 168 hours.  Hematology Recent Labs  Lab 08/06/22 2213 08/07/22 0239 08/08/22 0502  WBC 8.0 8.0 7.5  RBC 2.57* 2.61* 2.57*  HGB 7.8* 7.9* 7.7*  HCT 24.7* 25.3* 24.8*  MCV 96.1 96.9 96.5  MCH 30.4 30.3 30.0  MCHC 31.6 31.2 31.0  RDW 17.2* 17.6* 17.4*  PLT 261 260 257   Thyroid No results for input(s): "TSH", "FREET4"  in the last 168 hours.  BNP Recent Labs  Lab 08/04/22 1430  BNP 1,630.3*    DDimer  Recent Labs  Lab 08/04/22 1430  DDIMER 0.42     Radiology/Studies:  No results found.   Assessment and Plan:   Preoperative risk assessment prior to endoscopy -She may proceed with low to moderate overall cardiac risk given her underlying condition of hypertrophic cardiomyopathy.  There is no evidence of outflow tract obstruction currently. She has been adequately resuscitated.   GIB --Hgb < 6. Now 3 units blood. Stable at 7.9 --Dr. Dulce Sellar note reviewed. IV protonix.   AFIB --holding Eliquis. --Once stable would like her to see structural heart clinic as outpatient for Watchman device.   dCHF --holding metoprolol due to prior hypovolemia. No evidence of pulmonary edema. Getting low dose lasix 20mg  every day.   Hypotension --midodrine 5mg  BID.      Risk Assessment/Risk Scores:           For questions or updates, please contact [facility] Please consult [email or web address] for contact info under    Signed, Donato Schultz, MD  08/08/2022 2:56 PM

## 2022-08-08 NOTE — Progress Notes (Signed)
Subjective: No abdominal pain. No blood in stool.  Objective: Vital signs in last 24 hours: Temp:  [98 F (36.7 C)-98.4 F (36.9 C)] 98 F (36.7 C) (07/22 1308) Pulse Rate:  [68-87] 68 (07/22 1308) Resp:  [17-18] 18 (07/22 1308) BP: (102-126)/(57-72) 102/67 (07/22 1308) SpO2:  [93 %-99 %] 99 % (07/22 1308) Weight change:  Last BM Date : 08/05/22  PE: GEN:  Overweight, deconditioned-appearing, on supplemental oxygen with mild tachypnea at rest ABD:  Protuberant EXT:  2-3+ edema with wrappings on right lower extremity  Lab Results: CBC    Component Value Date/Time   WBC 7.5 08/08/2022 0502   RBC 2.57 (L) 08/08/2022 0502   HGB 7.7 (L) 08/08/2022 0502   HGB 12.8 01/04/2022 1259   HCT 24.8 (L) 08/08/2022 0502   HCT 37.2 01/04/2022 1259   PLT 257 08/08/2022 0502   PLT 192 01/04/2022 1259   MCV 96.5 08/08/2022 0502   MCV 96 01/04/2022 1259   MCH 30.0 08/08/2022 0502   MCHC 31.0 08/08/2022 0502   RDW 17.4 (H) 08/08/2022 0502   RDW 14.2 01/04/2022 1259   LYMPHSABS 1.1 08/04/2022 1430   MONOABS 0.5 08/04/2022 1430   EOSABS 0.2 08/04/2022 1430   BASOSABS 0.1 08/04/2022 1430  CMP     Component Value Date/Time   NA 138 08/08/2022 0502   NA 138 06/17/2022 1123   K 3.6 08/08/2022 0502   CL 110 08/08/2022 0502   CO2 23 08/08/2022 0502   GLUCOSE 96 08/08/2022 0502   BUN 26 (H) 08/08/2022 0502   BUN 22 06/17/2022 1123   CREATININE 1.15 (H) 08/08/2022 0502   CALCIUM 8.7 (L) 08/08/2022 0502   PROT 5.5 (L) 08/05/2022 0657   PROT 6.3 06/17/2022 1123   ALBUMIN 2.9 (L) 08/05/2022 0657   ALBUMIN 4.0 06/17/2022 1123   AST 37 08/05/2022 0657   ALT 19 08/05/2022 0657   ALKPHOS 138 (H) 08/05/2022 0657   BILITOT 1.0 08/05/2022 0657   BILITOT 0.6 06/17/2022 1123   GFR 84.31 05/26/2011 0928   EGFR 52 (L) 06/17/2022 1123   GFRNONAA 52 (L) 08/08/2022 0502   Assessment:  HOCM.  On supplemental oxygen. Anemia without overt GI bleeding. Chronic anticoagulation.  Plan:    Recheck INR (was high few days ago). Follow CBCs; transfuse as needed. Cardiology consult for optimization and risk stratification for possible egd/colon. Continue to hold anticoagulation. I discussed endoscopy/colonoscopy with patient and the relative increased risks involved given her comorbidities.  If at all possible she would like to pursue these procedures.   Freddy Jaksch 08/08/2022, 1:42 PM   Cell [phone] If no answer or after 5 PM call [phone]

## 2022-08-08 NOTE — Evaluation (Signed)
Physical Therapy Evaluation Patient Details Name: Ashyia Schraeder MRN: [id number] DOB: 1955-07-21 Today's Date: 08/08/2022  History of Present Illness  67 yo female presents to therapy s/p hospital admission on 08/04/2022 due to progressive weakness. Pt was found to be hypotensive with severe anemia HgB <6 and GIB. Pt hospitalized in May for AKI and again in June with d/c 7/4 secondary to B LE Cellulitis. Pt reports HH services. Pt has PMH including but not limited to: AKI, CKD III, dCHF, A-fib, HTN, HLD, and cardiac surgery.  Clinical Impression    Pt admitted with above diagnosis.  Pt currently with functional limitations due to the deficits listed below (see PT Problem List). Pt semi reclined in bed when PT arrived. Pt exhibited S and S of SOB and noted MD orders to wean from O2 at rest 85% on RA, pt unable to improve O2 saturation with coaching for pursed lip breathing and supplemental O2 donned at 2 L/min and pt quickly recovered to 92%. Supplemental O2 in place at 2 L/min for remainder of evaluation.  PT assessed Bp, please see below. Pt required increased time and S for supine to sit with HOB elevated and pt reported dizziness, sit to stand  from EOB with mod A x 1 pull to stand to RW, pt unable to maintain standing > 30s and Bp NT in standing. Pt required min A to complete SPT to recliner at RW level. Pt declined gait assessment today. Pt indicated she was upset today and requesting to speak with GI MD. Pt left seated in recliner, supplemental O2 and 94% and all needs in place. PT communicated with nursing staff per pt desaturation and request to speak with GI.  Pt will benefit from acute skilled PT to increase their independence and safety with mobility to allow discharge.   Bp at rest semi reclined 117/75 (79 PR) Bp seated EOB 108/69 (87 PR) Bp once seated in recliner 131/67 (88 PR)    Assistance Recommended at Discharge Intermittent Supervision/Assistance  If plan is discharge home, recommend  the following:  Can travel by private vehicle  A little help with bathing/dressing/bathroom;Assistance with cooking/housework;Assist for transportation;Help with stairs or ramp for entrance        Equipment Recommendations None recommended by PT  Recommendations for Other Services       Functional Status Assessment Patient has had a recent decline in their functional status and demonstrates the ability to make significant improvements in function in a reasonable and predictable amount of time.     Precautions / Restrictions Precautions Precautions: Fall Precaution Comments: R LE wound wrapped at eval Restrictions Weight Bearing Restrictions: No      Mobility  Bed Mobility Overal bed mobility: Needs Assistance Bed Mobility: Supine to Sit     Supine to sit: Supervision, HOB elevated     General bed mobility comments: increased time due to fatigue    Transfers Overall transfer level: Needs assistance Equipment used: Rolling walker (2 wheels) Transfers: Sit to/from Stand, Bed to chair/wheelchair/BSC Sit to Stand: Mod assist Stand pivot transfers: Min guard         General transfer comment: pt required mod A x 1 for sit to stand from elevated EOB, pt required min A to complete SPT to recliner. pt c/o dizziness with transitions    Ambulation/Gait               General Gait Details: NT, pt delcined ambualtion  Stairs  Wheelchair Mobility     Tilt Bed    Modified Rankin (Stroke Patients Only)       Balance Overall balance assessment: Needs assistance Sitting-balance support: Feet supported Sitting balance-Leahy Scale: Fair     Standing balance support: Bilateral upper extremity supported, Reliant on assistive device for balance, During functional activity Standing balance-Leahy Scale: Poor                               Pertinent Vitals/Pain Pain Assessment Pain Assessment: Faces Faces Pain Scale: Hurts little  more Pain Location: B LEs, HA Pain Descriptors / Indicators: Grimacing, Constant, Discomfort    Home Living Family/patient expects to be discharged to:: Private residence Living Arrangements: Alone   Type of Home: Apartment Home Access: Ramped entrance       Home Layout: One level Home Equipment: Agricultural consultant (2 wheels)      Prior Function Prior Level of Function : Independent/Modified Independent             Mobility Comments: walks with RW; doesn't drive, friends take her shopping, uses a shuttle bus to MD appointments ADLs Comments: grocery shops through [place], bird bathes at baseline, independent with ADLs and iADLs, kitchen and laundry are communal     Hand Dominance   Dominant Hand: Right    Extremity/Trunk Assessment        Lower Extremity Assessment Lower Extremity Assessment: Generalized weakness    Cervical / Trunk Assessment Cervical / Trunk Assessment: Normal  Communication   Communication: No difficulties  Cognition Arousal/Alertness: Awake/alert Behavior During Therapy: WFL for tasks assessed/performed Overall Cognitive Status: Within Functional Limits for tasks assessed                                          General Comments General comments (skin integrity, edema, etc.): Pt reported she was upset and wanted to speak with GI MD    Exercises     Assessment/Plan    PT Assessment Patient needs continued PT services  PT Problem List Decreased activity tolerance;Decreased mobility;Decreased strength;Decreased balance;Cardiopulmonary status limiting activity;Pain       PT Treatment Interventions Therapeutic exercise;Gait training;Balance training;Functional mobility training;Therapeutic activities;DME instruction;Neuromuscular re-education;Patient/family education    PT Goals (Current goals can be found in the Care Plan section)  Acute Rehab PT Goals Patient Stated Goal: return home, be able to walk farther PT Goal  Formulation: With patient Time For Goal Achievement: 08/29/22 Potential to Achieve Goals: Good    Frequency Min 1X/week     Co-evaluation               AM-PAC PT "6 Clicks" Mobility  Outcome Measure Help needed turning from your back to your side while in a flat bed without using bedrails?: None Help needed moving from lying on your back to sitting on the side of a flat bed without using bedrails?: None Help needed moving to and from a bed to a chair (including a wheelchair)?: A Little Help needed standing up from a chair using your arms (e.g., wheelchair or bedside chair)?: A Little Help needed to walk in hospital room?: Total Help needed climbing 3-5 steps with a railing? : Total 6 Click Score: 16    End of Session Equipment Utilized During Treatment: Gait belt Activity Tolerance: Patient limited by fatigue Patient left: with call bell/phone  within reach;in chair Nurse Communication: Mobility status;Other (comment) (O2 saturation and requesting to speak with GI MD) PT Visit Diagnosis: Difficulty in walking, not elsewhere classified (R26.2);Unsteadiness on feet (R26.81);Muscle weakness (generalized) (M62.81);Pain Pain - Right/Left:  (B LE and back and HA)    Time: 4696-2952 PT Time Calculation (min) (ACUTE ONLY): 23 min   Charges:   PT Evaluation $PT Eval Low Complexity: 1 Low PT Treatments $Therapeutic Activity: 8-22 mins PT General Charges $$ ACUTE PT VISIT: 1 Visit         Johnny Bridge, PT Acute Rehab   Jacqualyn Posey 08/08/2022, 1:31 PM

## 2022-08-08 NOTE — Plan of Care (Signed)
  Problem: Health Behavior/Discharge Planning: Goal: Ability to manage health-related needs will improve Outcome: Progressing   Problem: Clinical Measurements: Goal: Will remain free from infection Outcome: Progressing   Problem: Clinical Measurements: Goal: Diagnostic test results will improve Outcome: Progressing   Problem: Activity: Goal: Risk for activity intolerance will decrease Outcome: Progressing

## 2022-08-08 NOTE — Progress Notes (Signed)
  Progress Note   Patient: Christine Jacobson NGE:[id number] DOB: 1955/04/15 DOA: 08/04/2022     4 DOS: the patient was seen and examined on 08/08/2022   Brief hospital course: 67yow complicated PMH including afib on apixaban, recent discharge 7/4 after tx for LE cellultis, presented w/ weaness, found to have low BP, severe anemia. Admitted for GIB and further evaluation.  Consultants GI  Procedures   Assessment and Plan: Acute blood loss anemia Secondary to GIB Complicated by anticoagulation (apixaban) Associated with hypotension Severe anemia w/ Hgb <6 on admission. Was unaware of bleed; no previous hx known. Uses ibuprofen 2 tabs every other day for low back pain. No charted bleeding. Hemodynamics stable. Currently Hgb stable at 7.7 s/p total 3 units PRBC.   Continue IV PPI Q12 EGD as per GI. GI has requested cardiology evaluation    AKI -- resolved CKD stage IIIa Secondary to hypovolemia, hypotension secondary to ABLA, diuretic Improving. Creatinine 1.9 >> 1.6 > 1.45 > 1.15 today (baseline 1.0-1.2) Trend BMP, continue IVF   Acute on chronic diastolic congestive heart failure On admission BNP 1630 (baseline [phone]) Troponins negative. Elevated BNP, some congestion on CXR, but clinically stable   Last echo 2019 w/ normal LVEF, grade 2 diastolic dysfunction Mild acute CHF w/ mild hypoxia, will treat w/ Lasix x1 and monitor BMP in AM   Atrial fibrillation Acquired thrombophilia On metoprolol and Eliquis at home, currently in normal sinus rhythm Cotninue BB at lower dose Holding apixaban for above   Bilateral lower extremity cellulitis  Right lower extremity wound Resolved. Legs look good per WOC RN   Essential hypertension BP stable    Class II obesity Body mass index is 33.64 kg/m.   Plan now is for EGD.  Cardiology consultation as requested by GI.     Subjective:  Feels SOB this AM Frustrated, "I don't know what is going on" Willing to pursue GI  workup  Physical Exam: Vitals:   08/07/22 1528 08/07/22 2000 08/08/22 0631 08/08/22 1308  BP: (!) 116/57 122/72 126/68 102/67  Pulse: 81 69 87 68  Resp:  17 18 18   Temp: 98 F (36.7 C) 98.2 F (36.8 C) 98.4 F (36.9 C) 98 F (36.7 C)  TempSrc: Oral Oral Oral Oral  SpO2: 93% 96% 93% 99%  Weight:      Height:       Physical Exam Vitals reviewed.  Constitutional:      General: She is not in acute distress.    Appearance: She is not ill-appearing or toxic-appearing.  Cardiovascular:     Rate and Rhythm: Normal rate and regular rhythm.     Heart sounds: No murmur heard. Pulmonary:     Effort: No respiratory distress.     Breath sounds: No wheezing, rhonchi or rales.  Neurological:     Mental Status: She is alert.  Psychiatric:        Mood and Affect: Mood normal.        Behavior: Behavior normal.     Data Reviewed: AFVSS Creatinine down to 1.15 Hgb stable 7.7  Family Communication: none  Disposition: Status is: Inpatient Remains inpatient appropriate because: anemia     Time spent: 35 minutes  Author: Brendia Sacks, MD 08/08/2022 4:06 PM  For on call review [email or web address].

## 2022-08-08 NOTE — Progress Notes (Addendum)
Patient refuses Miralax. She does indeed confirm that her last BM was 08/05/22.  Addendum at 1250: Received patient wearing 2 liters oxygen this morning.  Removed nasal canula at 1135 per MD orders to wean. Checked oxygen saturation while siting chair at 1245 and it is 88% and patient appears pale and dyspneic/SOB. 2 liters nasal canula re-applied and notified MD.  Also, patient had a large black/brown BM (formed, hard) at 1235.  No blood seen.

## 2022-08-09 DIAGNOSIS — D6869 Other thrombophilia: Secondary | ICD-10-CM | POA: Diagnosis not present

## 2022-08-09 DIAGNOSIS — D62 Acute posthemorrhagic anemia: Secondary | ICD-10-CM | POA: Diagnosis not present

## 2022-08-09 DIAGNOSIS — K922 Gastrointestinal hemorrhage, unspecified: Secondary | ICD-10-CM | POA: Diagnosis not present

## 2022-08-09 DIAGNOSIS — I5030 Unspecified diastolic (congestive) heart failure: Secondary | ICD-10-CM | POA: Diagnosis not present

## 2022-08-09 DIAGNOSIS — I4819 Other persistent atrial fibrillation: Secondary | ICD-10-CM | POA: Diagnosis not present

## 2022-08-09 DIAGNOSIS — I4891 Unspecified atrial fibrillation: Secondary | ICD-10-CM | POA: Diagnosis not present

## 2022-08-09 LAB — PROTIME-INR
INR: 1 (ref 0.8–1.2)
Prothrombin Time: 13.5 seconds (ref 11.4–15.2)

## 2022-08-09 LAB — BASIC METABOLIC PANEL
Anion gap: 7 (ref 5–15)
BUN: 21 mg/dL (ref 8–23)
CO2: 25 mmol/L (ref 22–32)
Calcium: 8.8 mg/dL — ABNORMAL LOW (ref 8.9–10.3)
Chloride: 110 mmol/L (ref 98–111)
Creatinine, Ser: 1.02 mg/dL — ABNORMAL HIGH (ref 0.44–1.00)
GFR, Estimated: 60 mL/min — ABNORMAL LOW (ref >60)
Glucose, Bld: 103 mg/dL — ABNORMAL HIGH (ref 70–99)
Potassium: 3.3 mmol/L — ABNORMAL LOW (ref 3.5–5.1)
Sodium: 142 mmol/L (ref 135–145)

## 2022-08-09 LAB — CBC
HCT: 26 % — ABNORMAL LOW (ref 36.0–46.0)
Hemoglobin: 7.8 g/dL — ABNORMAL LOW (ref 12.0–15.0)
MCH: 28.8 pg (ref 26.0–34.0)
MCHC: 30 g/dL (ref 30.0–36.0)
MCV: 95.9 fL (ref 80.0–100.0)
Platelets: 248 10*3/uL (ref 150–400)
RBC: 2.71 MIL/uL — ABNORMAL LOW (ref 3.87–5.11)
RDW: 17.2 % — ABNORMAL HIGH (ref 11.5–15.5)
WBC: 6.8 10*3/uL (ref 4.0–10.5)
nRBC: 0 % (ref 0.0–0.2)

## 2022-08-09 MED ORDER — FUROSEMIDE 10 MG/ML IJ SOLN
20.0000 mg | Freq: Once | INTRAMUSCULAR | Status: AC
  Administered 2022-08-09: 20 mg via INTRAVENOUS
  Filled 2022-08-09: qty 2

## 2022-08-09 MED ORDER — PEG-KCL-NACL-NASULF-NA ASC-C 100 G PO SOLR
1.0000 | Freq: Once | ORAL | Status: AC
  Administered 2022-08-09: 200 g via ORAL
  Filled 2022-08-09: qty 1

## 2022-08-09 MED ORDER — FUROSEMIDE 20 MG PO TABS
20.0000 mg | ORAL_TABLET | Freq: Two times a day (BID) | ORAL | Status: DC
  Administered 2022-08-09 – 2022-08-12 (×5): 20 mg via ORAL
  Filled 2022-08-09 (×5): qty 1

## 2022-08-09 MED ORDER — POTASSIUM CHLORIDE CRYS ER 20 MEQ PO TBCR
40.0000 meq | EXTENDED_RELEASE_TABLET | Freq: Two times a day (BID) | ORAL | Status: AC
  Administered 2022-08-09 (×2): 40 meq via ORAL
  Filled 2022-08-09 (×2): qty 2

## 2022-08-09 MED ORDER — POTASSIUM CHLORIDE CRYS ER 20 MEQ PO TBCR: 40.0000 meq | EXTENDED_RELEASE_TABLET | Freq: Once | ORAL | Status: DC

## 2022-08-09 MED ORDER — BISACODYL 10 MG RE SUPP
10.0000 mg | Freq: Once | RECTAL | Status: AC
  Administered 2022-08-09: 10 mg via RECTAL
  Filled 2022-08-09: qty 1

## 2022-08-09 NOTE — Progress Notes (Signed)
Physical Therapy Treatment Patient Details Name: Christine Jacobson MRN: [id number] DOB: 1956-01-13 Today's Date: 08/09/2022   History of Present Illness 67 yo female presents to therapy s/p hospital admission on 08/04/2022 due to progressive weakness. Pt was found to be hypotensive with severe anemia HgB <6 and GIB. Pt hospitalized in May for AKI and again in June with d/c 7/4 secondary to B LE Cellulitis. Pt reports HH services. Pt has PMH including but not limited to: AKI, CKD III, dCHF, A-fib, HTN, HLD, and cardiac surgery.    PT Comments   Pt admitted with above diagnosis.  Pt currently with functional limitations due to the deficits listed below (see PT Problem List). PT arrived and pt seated in recliner. Pt agreeable to therapy intervention. Pt reported she was able to amb to and from the bathroom with RW. Pt required min guard for sit to stand from recliner with min cues. Maintain static standing balance F no UE support. Progress with gait 60 feet with RW, min guard and progressing to close S, cues for posture, proper distance from RW and coordinated breathing. PT upgraded goal for gait.  PT tx session conducted on RA and pt able to maintain >/=94% on RA. Pt reported mild dizziness with sit to stand, see below for Bp. Pt left seated in recliner and all needs in place.  Pt will benefit from acute skilled PT to increase their independence and safety with mobility to allow discharge.   Bp seated 131/57 (77 PR) Bp standing 115/68 (89 PR)     Assistance Recommended at Discharge Intermittent Supervision/Assistance  If plan is discharge home, recommend the following:  Can travel by private vehicle    A little help with bathing/dressing/bathroom;Assistance with cooking/housework;Assist for transportation;Help with stairs or ramp for entrance      Equipment Recommendations  None recommended by PT    Recommendations for Other Services       Precautions / Restrictions Precautions Precautions:  Fall Precaution Comments: R LE wound wrapped at eval Restrictions Weight Bearing Restrictions: No     Mobility  Bed Mobility               General bed mobility comments: pt seated in recliner when PT arrived    Transfers Overall transfer level: Needs assistance Equipment used: Rolling walker (2 wheels) Transfers: Sit to/from Stand, Bed to chair/wheelchair/BSC Sit to Stand: Min guard           General transfer comment: min cues for proper UE placement, pt able to maintain static standing no UE support and reports of mild dizziness when in standing and quickly subsided    Ambulation/Gait Ambulation/Gait assistance: Min guard Gait Distance (Feet): 60 Feet Assistive device: Rolling walker (2 wheels) Gait Pattern/deviations: Decreased step length - right, Decreased step length - left, Trunk flexed Gait velocity: decreased     General Gait Details: pt reported fatigue with ambulation pt able to maintain O2 saturation 94-95% on RA with exertion   Stairs             Wheelchair Mobility     Tilt Bed    Modified Rankin (Stroke Patients Only)       Balance Overall balance assessment: Needs assistance Sitting-balance support: Feet supported Sitting balance-Leahy Scale: Fair     Standing balance support: Bilateral upper extremity supported, Reliant on assistive device for balance, During functional activity Standing balance-Leahy Scale: Fair Standing balance comment: static standing no UE support  with wide BOS and mild postural sway  Cognition Arousal/Alertness: Awake/alert Behavior During Therapy: WFL for tasks assessed/performed Overall Cognitive Status: Within Functional Limits for tasks assessed                                          Exercises      General Comments General comments (skin integrity, edema, etc.): pt indicated she was in a better mood today, GI MD came and spoke with her  yesterday and progressing with endoscopy      Pertinent Vitals/Pain Pain Assessment Pain Assessment: Faces Faces Pain Scale: Hurts a little bit Pain Location: B LEs R > L Pain Descriptors / Indicators: Grimacing, Constant, Discomfort Pain Intervention(s): Limited activity within patient's tolerance, Monitored during session    Home Living                          Prior Function            PT Goals (current goals can now be found in the care plan section) Acute Rehab PT Goals Patient Stated Goal: return home, be able to walk farther PT Goal Formulation: With patient Time For Goal Achievement: 08/29/22 Potential to Achieve Goals: Good Progress towards PT goals: Progressing toward goals    Frequency    Min 1X/week      PT Plan Current plan remains appropriate    Co-evaluation              AM-PAC PT "6 Clicks" Mobility   Outcome Measure  Help needed turning from your back to your side while in a flat bed without using bedrails?: None Help needed moving from lying on your back to sitting on the side of a flat bed without using bedrails?: None Help needed moving to and from a bed to a chair (including a wheelchair)?: A Little Help needed standing up from a chair using your arms (e.g., wheelchair or bedside chair)?: A Little Help needed to walk in hospital room?: A Little Help needed climbing 3-5 steps with a railing? : Total 6 Click Score: 18    End of Session Equipment Utilized During Treatment: Gait belt Activity Tolerance: Patient limited by fatigue Patient left: with call bell/phone within reach;in chair Nurse Communication: Mobility status PT Visit Diagnosis: Difficulty in walking, not elsewhere classified (R26.2);Unsteadiness on feet (R26.81);Muscle weakness (generalized) (M62.81);Pain Pain - Right/Left:  (B LE)     Time: 4098-1191 PT Time Calculation (min) (ACUTE ONLY): 24 min  Charges:    $Gait Training: 8-22 mins $Therapeutic  Activity: 8-22 mins PT General Charges $$ ACUTE PT VISIT: 1 Visit                     Johnny Bridge, PT Acute Rehab    Jacqualyn Posey 08/09/2022, 1:20 PM

## 2022-08-09 NOTE — Progress Notes (Addendum)
  Progress Note   Patient: Christine Jacobson YTK:[id number] DOB: 1955/10/28 DOA: 08/04/2022     5 DOS: the patient was seen and examined on 08/09/2022   Brief hospital course: 67yow complicated PMH including afib on apixaban, recent discharge 7/4 after tx for LE cellultis, presented w/ weaness, found to have low BP, severe anemia. Admitted for GIB and further evaluation.  Hemoglobin has stabilized after transfusion.  Plan for endoscopy as per GI.  Consultants GI Cardiology (per GI request)  Procedures   Assessment and Plan: Acute blood loss anemia Secondary to GIB Complicated by anticoagulation (apixaban) Associated with hypotension Severe anemia w/ Hgb <6 on admission. Was unaware of bleed; no previous hx known. Uses ibuprofen 2 tabs every other day for low back pain. No charted bleeding. Hemodynamics stable. Currently Hgb stable at 7.8 s/p total 3 units PRBC.   Continue IV PPI Q12 Endoscopy as per GI.  Clear as per cardiology.   AKI -- resolved CKD stage IIIa Secondary to hypovolemia, hypotension secondary to ABLA, diuretic Improving. Creatinine 1.9 >> 1.6 > 1.45 > 1.15 > 1.02 today (baseline 1.0-1.2)  Hypokalemia K+ 3.3 Replete    Acute on chronic diastolic congestive heart failure On admission BNP 1630 (baseline [phone]) Troponins negative. Elevated BNP, some congestion on CXR, but clinically stable   Last echo 2019 w/ normal LVEF, grade 2 diastolic dysfunction Mild acute CHF w/ mild hypoxia,treated w/ IV Lasix x1 and monitor. Continue chronic oral Lasix   Atrial fibrillation Acquired thrombophilia On metoprolol and Eliquis at home, currently in normal sinus rhythm Continue BB at lower dose Holding apixaban for above Once stable cardiology would pt to see [facility] clinic as outpatient for Watchman device.    Bilateral lower extremity cellulitis  Right lower extremity wound Resolved. Legs look good per WOC RN   Essential hypertension BP stable    Class II  obesity Body mass index is 33.64 kg/m.   Pressure injury right buttock stage 2, present on admission      Subjective:  Feels better today Breathing better  Physical Exam: Vitals:   08/08/22 1308 08/08/22 2048 08/09/22 0510 08/09/22 1424  BP: 102/67 117/64 127/76 106/64  Pulse: 68 82 89 79  Resp: 18 14 16 18   Temp: 98 F (36.7 C) 98.4 F (36.9 C) 98.1 F (36.7 C) 98 F (36.7 C)  TempSrc: Oral Oral Oral Oral  SpO2: 99% 98% 96% 96%  Weight:      Height:       Physical Exam Vitals reviewed.  Constitutional:      General: She is not in acute distress.    Appearance: She is not ill-appearing or toxic-appearing.  Cardiovascular:     Rate and Rhythm: Normal rate and regular rhythm.     Heart sounds: No murmur heard. Pulmonary:     Effort: Pulmonary effort is normal. No respiratory distress.     Breath sounds: No wheezing, rhonchi or rales.  Neurological:     Mental Status: She is alert.  Psychiatric:        Mood and Affect: Mood normal.        Behavior: Behavior normal.     Data Reviewed: K+ 3.3 Creatinine down to 1.02 Hgb stable 7.8  Family Communication: none  Disposition: Status is: Inpatient Remains inpatient appropriate because: GIB     Time spent: 20 minutes  Author: Brendia Sacks, MD 08/09/2022 5:47 PM  For on call review [email or web address].

## 2022-08-09 NOTE — Progress Notes (Addendum)
Patient Name: Zhane Donlan Date of Encounter: 08/09/2022 [facility] Cardiologist: Thomasene Ripple, DO   Interval Summary  .    Patient reports frustration with being put on oxygen overnight, is not sure why this happened. She otherwise feels stable this morning and denies cardiovascular symptoms including dyspnea, chest pain, palpitations.   Vital Signs .    Vitals:   08/08/22 0631 08/08/22 1308 08/08/22 2048 08/09/22 0510  BP: 126/68 102/67 117/64 127/76  Pulse: 87 68 82 89  Resp: 18 18 14 16   Temp: 98.4 F (36.9 C) 98 F (36.7 C) 98.4 F (36.9 C) 98.1 F (36.7 C)  TempSrc: Oral Oral Oral Oral  SpO2: 93% 99% 98% 96%  Weight:      Height:        Intake/Output Summary (Last 24 hours) at 08/09/2022 0929 Last data filed at 08/08/2022 1253 Gross per 24 hour  Intake 120 ml  Output 450 ml  Net -330 ml      08/07/2022    5:00 AM 08/04/2022    6:55 AM 07/17/2022    4:46 AM  Last 3 Weights  Weight (lbs) 200 lb 13.4 oz 195 lb 15.8 oz 205 lb 4 oz  Weight (kg) 91.1 kg 88.9 kg 93.1 kg      Telemetry/ECG    Not on telemetry this admission, no new ECG tracing - Personally Reviewed  Physical Exam .   GEN: No acute distress.   Neck: No JVD Cardiac: RRR, 3/6 systolic murmur Respiratory: Bibasilar crackles on deep inspiration GI: Soft, nontender, non-distended  MS: bilateral lower extremity edema with erythema, chronic appearing  Assessment & Plan .    Preoperative risk assessment   Patient with pending endoscopy for GI bleeding evaluation. She may proceed with low to moderate overall cardiac risk given her underlying condition of hypertrophic cardiomyopathy.   Hypertrophic cardiomyopathy Chronic diastolic CHF  Last TTE in August of 2023 showed LVEF 60-65%, hypertrophic cardiomyopathy with severe concentric hypertrophy of LV. RHC in September 2023 with moderate pulmonary hypertension.  Patient with chronic LE edema but also noted with bibasilar pulmonary crackles  today.  Will repeat IV lasix 20mg  following potassium replacement this morning (K 3.3, will give twice today). Transition back to home dose oral lasix 20mg  BID this evening. Given hypotension this admission, defer changes to GDMT to outpatient follow up.   Paroxysmal afib  Patient with Eliquis held in the setting of GIB. EGD workup pending. Agree with Dr. Anne Fu that she should have outpatient evaluation for Watchman given such profound anemia on arrival to the hospital.   Hypertension  Patient initially hypotensive this admission and home metoprolol was held. Has since recovered BP and resumed lower dose Toprol 25mg  every day.   For questions or updates, please contact [facility] Please consult [email or web address] for contact info under        Signed, Perlie Gold, PA-C   Personally seen and examined. Agree with above.  Overall feeling well.  No significant shortness of breath or chest pain.  3/6 systolic murmur noted.  Has underlying hypertrophic cardiomyopathy.  Normal ejection fraction. - Giving giving IV Lasix 20 mg today replating potassium.  Transitioning back to oral Lasix 20 mg twice daily this evening.  No evidence of obstruction. - If hypotension occurs, remember to increase afterload with phenylephrine and aggressive hydration.  Try to avoid medications that can cause hypercontractility such as epinephrine. -Paroxysmal atrial fibrillation Eliquis held.  Outpatient watchman evaluation. -May proceed with endoscopy  from cardiovascular perspective.  Moderate risk.  Donato Schultz, MD

## 2022-08-09 NOTE — Progress Notes (Signed)
Subjective: No abdominal pain.  No blood in stool.  Objective: Vital signs in last 24 hours: Temp:  [98 F (36.7 C)-98.4 F (36.9 C)] 98.1 F (36.7 C) (07/23 0510) Pulse Rate:  [68-89] 89 (07/23 0510) Resp:  [14-18] 16 (07/23 0510) BP: (102-127)/(64-76) 127/76 (07/23 0510) SpO2:  [96 %-99 %] 96 % (07/23 0510) Weight change:  Last BM Date : 08/08/22  PE: GEN:  Mild tachypnea at rest ABD:  Soft, non-tender  Lab Results: CBC    Component Value Date/Time   WBC 6.8 08/09/2022 0615   RBC 2.71 (L) 08/09/2022 0615   HGB 7.8 (L) 08/09/2022 0615   HGB 12.8 01/04/2022 1259   HCT 26.0 (L) 08/09/2022 0615   HCT 37.2 01/04/2022 1259   PLT 248 08/09/2022 0615   PLT 192 01/04/2022 1259   MCV 95.9 08/09/2022 0615   MCV 96 01/04/2022 1259   MCH 28.8 08/09/2022 0615   MCHC 30.0 08/09/2022 0615   RDW 17.2 (H) 08/09/2022 0615   RDW 14.2 01/04/2022 1259   LYMPHSABS 1.1 08/04/2022 1430   MONOABS 0.5 08/04/2022 1430   EOSABS 0.2 08/04/2022 1430   BASOSABS 0.1 08/04/2022 1430  CMP     Component Value Date/Time   NA 142 08/09/2022 0615   NA 138 06/17/2022 1123   K 3.3 (L) 08/09/2022 0615   CL 110 08/09/2022 0615   CO2 25 08/09/2022 0615   GLUCOSE 103 (H) 08/09/2022 0615   BUN 21 08/09/2022 0615   BUN 22 06/17/2022 1123   CREATININE 1.02 (H) 08/09/2022 0615   CALCIUM 8.8 (L) 08/09/2022 0615   PROT 5.5 (L) 08/05/2022 0657   PROT 6.3 06/17/2022 1123   ALBUMIN 2.9 (L) 08/05/2022 0657   ALBUMIN 4.0 06/17/2022 1123   AST 37 08/05/2022 0657   ALT 19 08/05/2022 0657   ALKPHOS 138 (H) 08/05/2022 0657   BILITOT 1.0 08/05/2022 0657   BILITOT 0.6 06/17/2022 1123   GFR 84.31 05/26/2011 0928   EGFR 52 (L) 06/17/2022 1123   GFRNONAA 60 (L) 08/09/2022 0615   Assessment:  HOCM.  On supplemental oxygen. Anemia without overt GI bleeding. Chronic anticoagulation.  Currently on hold.  Plan:   Clear liquid diet.  Plan for endoscopy and colonoscopy tomorrow. Anticoagulation on  hold. CBCs, transfuse as needed. Patient aware of higher-than-average, but not prohibitive, risks of endoscopy/colonoscopy in light of her HOCM.  She has been cleared by cardiology. Risks (bleeding, infection, bowel perforation that could require surgery, sedation-related changes in cardiopulmonary systems), benefits (identification and possible treatment of source of symptoms, exclusion of certain causes of symptoms), and alternatives (watchful waiting, radiographic imaging studies, empiric medical treatment) of upper endoscopy (EGD) were explained to patient/family in detail and patient wishes to proceed.  Risks (bleeding, infection, bowel perforation that could require surgery, sedation-related changes in cardiopulmonary systems), benefits (identification and possible treatment of source of symptoms, exclusion of certain causes of symptoms), and alternatives (watchful waiting, radiographic imaging studies, empiric medical treatment) of colonoscopy were explained to patient/family in detail and patient wishes to proceed.  Next step in management pending endoscopy and colonoscopy findings. [facility] will follow.   Christine Jacobson 08/09/2022, 10:41 AM   Cell [phone] If no answer or after 5 PM call [phone]

## 2022-08-10 ENCOUNTER — Inpatient Hospital Stay (HOSPITAL_COMMUNITY): Payer: Medicare HMO | Admitting: Anesthesiology

## 2022-08-10 ENCOUNTER — Encounter (HOSPITAL_COMMUNITY)
Admission: EM | Disposition: A | Discharge status: Home / Self Care | Payer: Self-pay | Source: Home / Self Care | Attending: Family Medicine

## 2022-08-10 DIAGNOSIS — D120 Benign neoplasm of cecum: Secondary | ICD-10-CM

## 2022-08-10 DIAGNOSIS — K5730 Diverticulosis of large intestine without perforation or abscess without bleeding: Secondary | ICD-10-CM

## 2022-08-10 DIAGNOSIS — D62 Acute posthemorrhagic anemia: Secondary | ICD-10-CM | POA: Diagnosis not present

## 2022-08-10 DIAGNOSIS — K2970 Gastritis, unspecified, without bleeding: Secondary | ICD-10-CM

## 2022-08-10 DIAGNOSIS — I4819 Other persistent atrial fibrillation: Secondary | ICD-10-CM

## 2022-08-10 HISTORY — PX: COLONOSCOPY WITH PROPOFOL: SHX5780

## 2022-08-10 HISTORY — PX: ESOPHAGOGASTRODUODENOSCOPY (EGD) WITH PROPOFOL: SHX5813

## 2022-08-10 HISTORY — PX: BIOPSY: SHX5522

## 2022-08-10 HISTORY — PX: POLYPECTOMY: SHX5525

## 2022-08-10 LAB — BASIC METABOLIC PANEL
Anion gap: 13 (ref 5–15)
BUN: 14 mg/dL (ref 8–23)
CO2: 22 mmol/L (ref 22–32)
Calcium: 9.6 mg/dL (ref 8.9–10.3)
Chloride: 110 mmol/L (ref 98–111)
Creatinine, Ser: 1.15 mg/dL — ABNORMAL HIGH (ref 0.44–1.00)
GFR, Estimated: 52 mL/min — ABNORMAL LOW (ref >60)
Glucose, Bld: 189 mg/dL — ABNORMAL HIGH (ref 70–99)
Potassium: 3.3 mmol/L — ABNORMAL LOW (ref 3.5–5.1)
Sodium: 145 mmol/L (ref 135–145)

## 2022-08-10 LAB — CBC
HCT: 30.6 % — ABNORMAL LOW (ref 36.0–46.0)
Hemoglobin: 9.2 g/dL — ABNORMAL LOW (ref 12.0–15.0)
MCH: 28.8 pg (ref 26.0–34.0)
MCHC: 30.1 g/dL (ref 30.0–36.0)
MCV: 95.9 fL (ref 80.0–100.0)
Platelets: 293 10*3/uL (ref 150–400)
RBC: 3.19 MIL/uL — ABNORMAL LOW (ref 3.87–5.11)
RDW: 17.4 % — ABNORMAL HIGH (ref 11.5–15.5)
WBC: 8.3 10*3/uL (ref 4.0–10.5)
nRBC: 0 % (ref 0.0–0.2)

## 2022-08-10 SURGERY — ESOPHAGOGASTRODUODENOSCOPY (EGD) WITH PROPOFOL
Anesthesia: Monitor Anesthesia Care | Laterality: Left

## 2022-08-10 MED ORDER — LACTATED RINGERS IV SOLN: INTRAVENOUS | Status: DC

## 2022-08-10 MED ORDER — PROPOFOL 500 MG/50ML IV EMUL
INTRAVENOUS | Status: DC | PRN
  Administered 2022-08-10: 150 ug/kg/min via INTRAVENOUS

## 2022-08-10 MED ORDER — PHENYLEPHRINE HCL (PRESSORS) 10 MG/ML IV SOLN
INTRAVENOUS | Status: DC | PRN
  Administered 2022-08-10 (×6): 160 ug via INTRAVENOUS

## 2022-08-10 MED ORDER — PROPOFOL 10 MG/ML IV BOLUS
INTRAVENOUS | Status: DC | PRN
Start: 2022-08-10 — End: 2022-08-10
  Administered 2022-08-10: 50 mg via INTRAVENOUS

## 2022-08-10 MED ORDER — SODIUM CHLORIDE 0.9 % IV SOLN: INTRAVENOUS | Status: DC

## 2022-08-10 MED ORDER — LIDOCAINE HCL (CARDIAC) PF 100 MG/5ML IV SOSY
PREFILLED_SYRINGE | INTRAVENOUS | Status: DC | PRN
  Administered 2022-08-10: 60 mg via INTRAVENOUS

## 2022-08-10 SURGICAL SUPPLY — 25 items

## 2022-08-10 NOTE — Progress Notes (Signed)
PROGRESS NOTE    Christine Jacobson  ZOX:[id number] DOB: 21-Nov-1955 DOA: 08/04/2022 PCP: Karenann Cai, NP   Brief Narrative:  67yow complicated PMH including afib on apixaban, recent discharge 7/4 after tx for LE cellultis, presented w/ weaness, found to have low BP, severe anemia. Admitted for GIB and further evaluation. Hemoglobin has stabilized after transfusion. Plan for endoscopy as per GI.   Assessment & Plan:   Principal Problem:   Acute blood loss anemia Active Problems:   Hypertension   Chronic diastolic heart failure (HCC)   Obesity (BMI 30-39.9)   Persistent atrial fibrillation (HCC)   AKI (acute kidney injury) (HCC)   GIB (gastrointestinal bleeding)   Acquired thrombophilia (HCC)   Chronic anticoagulation  Acute blood loss anemia Secondary to GIB Complicated by anticoagulation (apixaban) Associated with hypotension Severe anemia w/ Hgb <6 on admission. Was unaware of bleed; no previous hx known. Uses ibuprofen 2 tabs every other day for low back pain. No charted bleeding. Hemodynamics stable. Currently Hgb stable at 9.2, s/p 2 units total PRBC transfusion this hospitalization.  Continue PPI twice daily, GI on board, patient is scheduled for EGD and colonoscopy today.     AKI on CKD stage IIIa: Creatinine now back to baseline.   Hypokalemia: Low again, will replace.  Acute on chronic diastolic congestive heart failure On admission BNP 1630 (baseline [phone]) Troponins negative. Elevated BNP, some congestion on CXR, but clinically stable   Last echo 2019 w/ normal LVEF, grade 2 diastolic dysfunction Mild acute CHF w/ mild hypoxia,treated w/ IV Lasix x1 and monitor.  She has +1-2 pitting edema bilateral lower extremity.  But she says that this is better than what it was before.  Continue chronic oral Lasix   Atrial fibrillation Acquired thrombophilia On metoprolol and Eliquis at home, currently in normal sinus rhythm Continue BB at lower dose Holding apixaban  for above Once stable cardiology would pt to see structural heart clinic as outpatient for Watchman device.    Bilateral lower extremity cellulitis  Right lower extremity wound Resolved. Legs look good per WOC RN   Essential hypertension BP stable, she is on Toprol-XL.   Class II obesity Body mass index is 33.64 kg/m.  Weight loss and dietary modification counseled.   Pressure injury right buttock stage 2, present on admission   DVT prophylaxis: SCDs Start: 08/04/22 1635   Code Status: DNR  Family Communication:  None present at bedside.  Plan of care discussed with patient in length and he/she verbalized understanding and agreed with it.  Status is: Inpatient Remains inpatient appropriate because: Scheduled for EGD and colonoscopy today.   Estimated body mass index is 34.47 kg/m as calculated from the following:   Height as of this encounter: 5\' 4"  (1.626 m).   Weight as of this encounter: 91.1 kg.  Pressure Injury 06/06/22 Buttocks Posterior;Right Stage 2 -  Partial thickness loss of dermis presenting as a shallow open injury with a red, pink wound bed without slough. (Active)  06/06/22 1930  Location: Buttocks  Location Orientation: Posterior;Right  Staging: Stage 2 -  Partial thickness loss of dermis presenting as a shallow open injury with a red, pink wound bed without slough.  Wound Description (Comments):   Present on Admission:   Dressing Type Foam - Lift dressing to assess site every shift 08/10/22 0800   Nutritional Assessment: Body mass index is 34.47 kg/m.Marland Kitchen Seen by dietician.  I agree with the assessment and plan as outlined below: Nutrition Status:        .  Skin Assessment: I have examined the patient's skin and I agree with the wound assessment as performed by the wound care RN as outlined below: Pressure Injury 06/06/22 Buttocks Posterior;Right Stage 2 -  Partial thickness loss of dermis presenting as a shallow open injury with a red, pink wound bed  without slough. (Active)  06/06/22 1930  Location: Buttocks  Location Orientation: Posterior;Right  Staging: Stage 2 -  Partial thickness loss of dermis presenting as a shallow open injury with a red, pink wound bed without slough.  Wound Description (Comments):   Present on Admission:   Dressing Type Foam - Lift dressing to assess site every shift 08/10/22 0800    Consultants:  Cardiology and GI  Procedures:  As above  Antimicrobials:  Anti-infectives (From admission, onward)    None         Subjective: Patient seen and examined.  No complaints.  She is looking forward to getting her scopes today and getting out of the hospital tomorrow.  Objective: Vitals:   08/09/22 0510 08/09/22 1424 08/09/22 2247 08/10/22 0618  BP: 127/76 106/64 115/74 99/74  Pulse: 89 79 85 98  Resp: 16 18 18 18   Temp: 98.1 F (36.7 C) 98 F (36.7 C) 97.9 F (36.6 C) 98.2 F (36.8 C)  TempSrc: Oral Oral Oral Oral  SpO2: 96% 96% 96% 98%  Weight:      Height:        Intake/Output Summary (Last 24 hours) at 08/10/2022 1041 Last data filed at 08/10/2022 0841 Gross per 24 hour  Intake 120 ml  Output --  Net 120 ml   Filed Weights   08/04/22 0655 08/07/22 0500  Weight: 88.9 kg 91.1 kg    Examination:  General exam: Appears calm and comfortable  Respiratory system: Clear to auscultation. Respiratory effort normal. Cardiovascular system: S1 & S2 heard, RRR. No JVD, murmurs, rubs, gallops or clicks. No pedal edema. Gastrointestinal system: Abdomen is nondistended, soft and nontender. No organomegaly or masses felt. Normal bowel sounds heard. Central nervous system: Alert and oriented. No focal neurological deficits. Extremities: Hyperpigmentation bilateral lower extremities with +1-2 pitting edema bilateral lower extremities.  Nontender and no warmth. Psychiatry: Judgement and insight appear normal. Mood & affect appropriate.    Data Reviewed: I have personally reviewed following labs  and imaging studies  CBC: Recent Labs  Lab 08/04/22 1430 08/04/22 2041 08/06/22 2213 08/07/22 0239 08/08/22 0502 08/09/22 0615 08/10/22 0926  WBC 9.7   < > 8.0 8.0 7.5 6.8 8.3  NEUTROABS 7.7  --   --   --   --   --   --   HGB 5.4*   < > 7.8* 7.9* 7.7* 7.8* 9.2*  HCT 18.9*   < > 24.7* 25.3* 24.8* 26.0* 30.6*  MCV 99.5   < > 96.1 96.9 96.5 95.9 95.9  PLT 321   < > 261 260 257 248 293   < > = values in this interval not displayed.   Basic Metabolic Panel: Recent Labs  Lab 08/06/22 0239 08/07/22 0239 08/08/22 0502 08/09/22 0615 08/10/22 0926  NA 136 139 138 142 145  K 3.8 4.1 3.6 3.3* 3.3*  CL 107 109 110 110 110  CO2 23 23 23 25 22   GLUCOSE 101* 113* 96 103* 189*  BUN 48* 39* 26* 21 14  CREATININE 1.60* 1.45* 1.15* 1.02* 1.15*  CALCIUM 8.9 9.1 8.7* 8.8* 9.6   GFR: Estimated Creatinine Clearance: 50.5 mL/min (A) (by C-G formula based on SCr  of 1.15 mg/dL (H)). Liver Function Tests: Recent Labs  Lab 08/04/22 1430 08/05/22 0657  AST 15 37  ALT 15 19  ALKPHOS 153* 138*  BILITOT 1.0 1.0  PROT 6.2* 5.5*  ALBUMIN 3.3* 2.9*   No results for input(s): "LIPASE", "AMYLASE" in the last 168 hours. No results for input(s): "AMMONIA" in the last 168 hours. Coagulation Profile: Recent Labs  Lab 08/04/22 1430 08/09/22 0615  INR 2.7* 1.0   Cardiac Enzymes: No results for input(s): "CKTOTAL", "CKMB", "CKMBINDEX", "TROPONINI" in the last 168 hours. BNP (last 3 results) No results for input(s): "PROBNP" in the last 8760 hours. HbA1C: No results for input(s): "HGBA1C" in the last 72 hours. CBG: No results for input(s): "GLUCAP" in the last 168 hours. Lipid Profile: No results for input(s): "CHOL", "HDL", "LDLCALC", "TRIG", "CHOLHDL", "LDLDIRECT" in the last 72 hours. Thyroid Function Tests: No results for input(s): "TSH", "T4TOTAL", "FREET4", "T3FREE", "THYROIDAB" in the last 72 hours. Anemia Panel: No results for input(s): "VITAMINB12", "FOLATE", "FERRITIN", "TIBC",  "IRON", "RETICCTPCT" in the last 72 hours. Sepsis Labs: No results for input(s): "PROCALCITON", "LATICACIDVEN" in the last 168 hours.  No results found for this or any previous visit (from the past 240 hour(s)).   Radiology Studies: No results found.  Scheduled Meds:  furosemide  20 mg Oral BID   metoprolol succinate  25 mg Oral Daily   midodrine  5 mg Oral BID WC   pantoprazole  40 mg Oral BID   Continuous Infusions:   LOS: 6 days   Hughie Closs, MD [facility]  08/10/2022, 10:41 AM   *Please note that this is a verbal dictation therefore any spelling or grammatical errors are due to the "Dragon [employer] One" system interpretation.  Please page via Amion and do not message via secure chat for urgent patient care matters. Secure chat can be used for non urgent patient care matters.  How to contact the [facility] Attending or Consulting provider 7A - 7P or covering provider during after hours 7P -7A, for this patient?  Check the care team in [facility] and look for a) attending/consulting [facility] provider listed and b) the [facility] team listed. Page or secure chat 7A-7P. Log into [email or web address] and use [place]'s universal password to access. If you do not have the password, please contact the hospital operator. Locate the [facility] provider you are looking for under [facility] and page to a number that you can be directly reached. If you still have difficulty reaching the provider, please page the [facility] (Director on Call) for the Hospitalists listed on amion for assistance.

## 2022-08-10 NOTE — Transfer of Care (Signed)
Immediate Anesthesia Transfer of Care Note  Patient: Christine Jacobson  Procedure(s) Performed: ESOPHAGOGASTRODUODENOSCOPY (EGD) WITH PROPOFOL (Left) COLONOSCOPY WITH PROPOFOL (Left) BIOPSY POLYPECTOMY  Patient Location: Short Stay  Anesthesia Type:MAC  Level of Consciousness: awake, drowsy, and patient cooperative  Airway & Oxygen Therapy: Patient Spontanous Breathing and Patient connected to face mask oxygen  Post-op Assessment: Report given to RN, Post -op Vital signs reviewed and stable, and Patient moving all extremities X 4  Post vital signs: Reviewed and stable  Last Vitals:  Vitals Value Taken Time  BP 113/59 08/10/22 1430  Temp 36.3 C 08/10/22 1426  Pulse 106 08/10/22 1430  Resp 22 08/10/22 1430  SpO2 100 % 08/10/22 1430  Vitals shown include unfiled device data.  Last Pain:  Vitals:   08/10/22 1426  TempSrc: Temporal  PainSc:       Patients Stated Pain Goal: 0 (08/04/22 1946)  Complications: No notable events documented.

## 2022-08-10 NOTE — Care Management Important Message (Signed)
Important Message  Patient Details IM Letter given. Name: Safira Proffit MRN: [id number] Date of Birth: [birth year]   Medicare Important Message Given:  Yes     Caren Macadam 08/10/2022, 9:27 AM

## 2022-08-10 NOTE — Anesthesia Preprocedure Evaluation (Signed)
Anesthesia Evaluation  Patient identified by MRN, date of birth, ID band Patient awake    Reviewed: Allergy & Precautions, H&P , NPO status , Patient's Chart, lab work & pertinent test results  Airway Mallampati: II  TM Distance: >3 FB Neck ROM: Full    Dental no notable dental hx.    Pulmonary neg pulmonary ROS, former smoker   Pulmonary exam normal breath sounds clear to auscultation       Cardiovascular hypertension, Normal cardiovascular exam+ dysrhythmias Atrial Fibrillation + Valvular Problems/Murmurs  Rhythm:Regular Rate:Normal  HOCM  Diastolic dysfunction   Neuro/Psych negative neurological ROS  negative psych ROS   GI/Hepatic negative GI ROS, Neg liver ROS,,,  Endo/Other  negative endocrine ROS    Renal/GU negative Renal ROS  negative genitourinary   Musculoskeletal negative musculoskeletal ROS (+)    Abdominal   Peds negative pediatric ROS (+)  Hematology negative hematology ROS (+)   Anesthesia Other Findings   Reproductive/Obstetrics negative OB ROS                             Anesthesia Physical Anesthesia Plan  ASA: 3  Anesthesia Plan: MAC   Post-op Pain Management: Minimal or no pain anticipated   Induction: Intravenous  PONV Risk Score and Plan: 2 and Propofol infusion and Treatment may vary due to age or medical condition  Airway Management Planned: Simple Face Mask  Additional Equipment:   Intra-op Plan:   Post-operative Plan:   Informed Consent: I have reviewed the patients History and Physical, chart, labs and discussed the procedure including the risks, benefits and alternatives for the proposed anesthesia with the patient or authorized representative who has indicated his/her understanding and acceptance.     Dental advisory given  Plan Discussed with: CRNA and Surgeon  Anesthesia Plan Comments:        Anesthesia Quick Evaluation

## 2022-08-10 NOTE — Anesthesia Postprocedure Evaluation (Signed)
Anesthesia Post Note  Patient: Christine Jacobson  Procedure(s) Performed: ESOPHAGOGASTRODUODENOSCOPY (EGD) WITH PROPOFOL (Left) COLONOSCOPY WITH PROPOFOL (Left) BIOPSY POLYPECTOMY     Patient location during evaluation: PACU Anesthesia Type: MAC Level of consciousness: awake and alert Pain management: pain level controlled Vital Signs Assessment: post-procedure vital signs reviewed and stable Respiratory status: spontaneous breathing, nonlabored ventilation, respiratory function stable and patient connected to nasal cannula oxygen Cardiovascular status: stable and blood pressure returned to baseline Postop Assessment: no apparent nausea or vomiting Anesthetic complications: no  No notable events documented.  Last Vitals:  Vitals:   08/10/22 1440 08/10/22 1450  BP: (!) 106/53   Pulse: (!) 106 96  Resp: 18   Temp:    SpO2: 92% 96%    Last Pain:  Vitals:   08/10/22 1440  TempSrc:   PainSc: 0-No pain                 Auden Wettstein S

## 2022-08-10 NOTE — Op Note (Signed)
[facility] Patient Name: Christine Jacobson Procedure Date: 08/10/2022 MRN: [id number] Attending MD: Willis Modena , MD, [id number] Date of Birth: October 20, 1955 CSN: [id number] Age: 67 Admit Type: Inpatient Procedure:                Colonoscopy Indications:              This is the patient's first colonoscopy,                            Unexplained iron deficiency anemia Providers:                Willis Modena, MD, Fransisca Connors, Priscella Mann,                            Technician Referring MD:             [facility] Medicines:                Monitored Anesthesia Care Complications:            No immediate complications. Estimated Blood Loss:     Estimated blood loss: none. Procedure:                Pre-Anesthesia Assessment:                           - Prior to the procedure, a History and Physical                            was performed, and patient medications and                            allergies were reviewed. The patient's tolerance of                            previous anesthesia was also reviewed. The risks                            and benefits of the procedure and the sedation                            options and risks were discussed with the patient.                            All questions were answered, and informed consent                            was obtained. Prior Anticoagulants: The patient has                            taken Eliquis (apixaban), last dose was 5 days                            prior to procedure. ASA Grade Assessment: III - A  patient with severe systemic disease. After                            reviewing the risks and benefits, the patient was                            deemed in satisfactory condition to undergo the                            procedure.                           After obtaining informed consent, the colonoscope                            was passed under direct vision.  Throughout the                            procedure, the patient's blood pressure, pulse, and                            oxygen saturations were monitored continuously. The                            CF-HQ190L ([id number] Olympus colonoscope was                            introduced through the anus and advanced to the the                            cecum, identified by appendiceal orifice and                            ileocecal valve. The ileocecal valve, appendiceal                            orifice, and rectum were photographed. The entire                            colon was examined. The colonoscopy was performed                            without difficulty. The patient tolerated the                            procedure well. The quality of the bowel                            preparation was good. Scope In: 2:01:13 PM Scope Out: 2:19:37 PM Scope Withdrawal Time: 0 hours 13 minutes 31 seconds  Total Procedure Duration: 0 hours 18 minutes 24 seconds  Findings:      Hemorrhoids were found on perianal exam.      Internal hemorrhoids were found during retroflexion. The hemorrhoids       were moderate.  A few small-mouthed diverticula were found in the sigmoid colon and       descending colon.      Two sessile polyps were found in the cecum. The polyps were 5 to 7 mm in       size. These polyps were removed with a hot snare. Resection and       retrieval were complete.      Colon otherwise normal; no other polyps, masses, vascular ectasias, or       inflammatory changes were seen.      No additional abnormalities were found on retroflexion. Impression:               - Hemorrhoids found on perianal exam.                           - Internal hemorrhoids.                           - Diverticulosis in the sigmoid colon and in the                            descending colon.                           - Two 5 to 7 mm polyps in the cecum, removed with a                            hot  snare. Resected and retrieved.                           - The examination was otherwise normal. No ominous                            source of anemia seen on endoscopy or colonoscopy. Moderate Sedation:      None Recommendation:           - Return patient to hospital ward for ongoing care.                           - Soft diet today.                           - Continue present medications.                           - Await pathology results.                           - Repeat colonoscopy (date not yet determined) for                            surveillance based on pathology results.                           - Restart Eliquis in 3 days (08/13/22).                           -  [facility] will sign-off; we can arrange outpatient                            follow-up with [place]; please call with any questions;                            thank you for the consultation. Procedure Code(s):        --- Professional ---                           [id number], Colonoscopy, flexible; with removal of                            tumor(s), polyp(s), or other lesion(s) by snare                            technique Diagnosis Code(s):        --- Professional ---                           K64.8, Other hemorrhoids                           D12.0, Benign neoplasm of cecum                           D50.9, Iron deficiency anemia, unspecified                           K57.30, Diverticulosis of large intestine without                            perforation or abscess without bleeding CPT copyright 2022 American [employer] Association. All rights reserved. The codes documented in this report are preliminary and upon coder review may  be revised to meet current compliance requirements. Willis Modena, MD 08/10/2022 2:36:18 PM This report has been signed electronically. Number of Addenda: 0

## 2022-08-10 NOTE — Op Note (Signed)
[facility] Patient Name: Christine Jacobson Procedure Date: 08/10/2022 MRN: [id number] Attending MD: Willis Modena , MD, [id number] Date of Birth: 1955/06/03 CSN: [id number] Age: 67 Admit Type: Inpatient Procedure:                Upper GI endoscopy Indications:              Unexplained iron deficiency anemia Providers:                Willis Modena, MD, Fransisca Connors, Priscella Mann,                            Technician Referring MD:             [facility] Medicines:                Monitored Anesthesia Care Complications:            No immediate complications. Estimated Blood Loss:     Estimated blood loss: none. Procedure:                Pre-Anesthesia Assessment:                           - Prior to the procedure, a History and Physical                            was performed, and patient medications and                            allergies were reviewed. The patient's tolerance of                            previous anesthesia was also reviewed. The risks                            and benefits of the procedure and the sedation                            options and risks were discussed with the patient.                            All questions were answered, and informed consent                            was obtained. Prior Anticoagulants: The patient has                            taken Eliquis (apixaban), last dose was 5 days                            prior to procedure. ASA Grade Assessment: III - A                            patient with severe systemic disease. After  reviewing the risks and benefits, the patient was                            deemed in satisfactory condition to undergo the                            procedure.                           After obtaining informed consent, the endoscope was                            passed under direct vision. Throughout the                            procedure, the patient's  blood pressure, pulse, and                            oxygen saturations were monitored continuously. The                            [id number]-H190 ([id number]) Olympus endoscope was introduced                            through the mouth, and advanced to the second part                            of duodenum. The upper GI endoscopy was                            accomplished without difficulty. The patient                            tolerated the procedure well. Scope In: Scope Out: Findings:      The examined esophagus was normal.      Patchy mild inflammation characterized by erosions was found in the       gastric fundus, in the gastric body and in the gastric antrum. Biopsies       were taken with a cold forceps for histology.      The exam of the stomach was otherwise normal.      The duodenal bulb, first portion of the duodenum and second portion of       the duodenum were normal. Impression:               - Normal esophagus.                           - Gastritis. Biopsied.                           - Normal duodenal bulb, first portion of the                            duodenum and second portion of the duodenum. Moderate Sedation:      Not Applicable - Patient had care  per Anesthesia. Recommendation:           - Perform a colonoscopy today. Procedure Code(s):        --- Professional ---                           [phone], Esophagogastroduodenoscopy, flexible,                            transoral; with biopsy, single or multiple Diagnosis Code(s):        --- Professional ---                           K29.70, Gastritis, unspecified, without bleeding                           D50.9, Iron deficiency anemia, unspecified CPT copyright 2022 [employer]. All rights reserved. The codes documented in this report are preliminary and upon coder review may  be revised to meet current compliance requirements. Willis Modena, MD 08/10/2022 2:28:35 PM This report has been signed  electronically. Number of Addenda: 0

## 2022-08-10 NOTE — Interval H&P Note (Signed)
History and Physical Interval Note:  08/10/2022 1:35 PM  Christine Jacobson  has presented today for surgery, with the diagnosis of anemia.  The various methods of treatment have been discussed with the patient and family. After consideration of risks, benefits and other options for treatment, the patient has consented to  Procedure(s): ESOPHAGOGASTRODUODENOSCOPY (EGD) WITH PROPOFOL (Left) COLONOSCOPY WITH PROPOFOL (Left) as a surgical intervention.  The patient's history has been reviewed, patient examined, no change in status, stable for surgery.  I have reviewed the patient's chart and labs.  Questions were answered to the patient's satisfaction.     Freddy Jaksch

## 2022-08-11 LAB — SURGICAL PATHOLOGY

## 2022-08-11 NOTE — Progress Notes (Signed)
  Progress Note   Patient: Christine Jacobson [id number]:[id number] DOB: 17-Apr-1955 DOA: 08/04/2022     7 DOS: the patient was seen and examined on 08/11/2022   Brief hospital course: 67yow complicated PMH including afib on apixaban, recent discharge 7/4 after tx for LE cellultis, presented w/ weaness, found to have low BP, severe anemia. Admitted for GIB and further evaluation. Hemoglobin has stabilized after transfusion. Pt underwent EGD and colon 7/24 with findings of gastritis, hemorrhoids, and diverticulosis. GI has since signed off as of 7/24   Assessment and Plan: Acute blood loss anemia Secondary to GIB Complicated by anticoagulation (apixaban) Associated with hypotension Severe anemia w/ Hgb <6 on admission. Was unaware of acute bleed; no previous hx known. Uses ibuprofen 2 tabs every other day for low back pain. Pt is now s/p 2 units total PRBC transfusion this hospitalization.  Continue PPI twice daily Pt was seen by GI and underwent EGD/colon 7/24 with findings of gastritis, hemorrhoids, diverticulosis and two polyps.    AKI on CKD stage IIIa: Creatinine now back to baseline.   Hypokalemia: was corrected   Acute on chronic diastolic congestive heart failure On admission BNP 1630 (baseline [phone]) Troponins negative. Elevated BNP, some congestion on CXR, but clinically stable   Last echo 2019 w/ normal LVEF, grade 2 diastolic dysfunction Mild acute CHF w/ mild hypoxia,given IV Lasix x1.  She has +1-2 pitting edema bilateral lower extremity.  Continued on PO lasix    Atrial fibrillation Acquired thrombophilia On metoprolol and Eliquis at home, currently in normal sinus rhythm Continue BB at lower dose Holding apixaban for above, per GI, OK to resume on 7/27 Once stable cardiology would pt to see [facility] clinic as outpatient for Watchman device.    Bilateral lower extremity cellulitis  Right lower extremity wound Resolved. Legs look good per WOC RN   Essential  hypertension BP stable, she is on Toprol-XL.   Class II obesity Body mass index is 33.64 kg/m.  Weight loss and dietary modification counseled.   Pressure injury right buttock stage 2, present on admission    Subjective: Anxious this AM  Physical Exam: Vitals:   08/10/22 2008 08/11/22 0445 08/11/22 1015 08/11/22 1322  BP: 97/64 128/60 (!) 142/63 120/67  Pulse: 100 100 (!) 102 88  Resp: 18 18 16 17   Temp: 98.3 F (36.8 C) 97.6 F (36.4 C)  98 F (36.7 C)  TempSrc: Oral Oral  Oral  SpO2: 97% 97% 97% 96%  Weight:      Height:       General exam: Awake, laying in bed, in nad Respiratory system: Normal respiratory effort, no wheezing Cardiovascular system: regular rate, s1, s2 Gastrointestinal system: Soft, nondistended, positive BS Central nervous system: CN2-12 grossly intact, strength intact Extremities: Perfused, no clubbing Skin: Normal skin turgor, no notable skin lesions seen Psychiatry: Mood normal // no visual hallucinations   Data Reviewed:  There are no new results to review at this time.  Family Communication: Pt in room, family not at bedside  Disposition: Status is: Inpatient Remains inpatient appropriate because: Severity of illness  Planned Discharge Destination: Home with Home Health     Author: Rickey Barbara, MD 08/11/2022 6:04 PM  For on call review [email or web address].

## 2022-08-11 NOTE — Plan of Care (Signed)

## 2022-08-11 NOTE — Progress Notes (Signed)
Physical Therapy Treatment Patient Details Name: Christine Jacobson MRN: [id number] DOB: [birth year] Today's Date: 08/11/2022   History of Present Illness 67 yo female presents to therapy s/p hospital admission on 08/04/2022 due to progressive weakness. Pt was found to be hypotensive with severe anemia HgB <6 and GIB. Pt hospitalized in May for AKI and again in June with d/c 7/4 secondary to B LE Cellulitis. Pt reports HH services. Pt has PMH including but not limited to: AKI, CKD III, dCHF, A-fib, HTN, HLD, and cardiac surgery.    PT Comments  General Comments: AxO x 3 anxious/nervous and slightly disorganized in her thoughts.  She lives home alone  Pt did agree to get OOB and amb to bathroom.  General transfer comment: min cues for proper UE placement, pt able to maintain static standing no UE support.  Slight increase in anxiety.  "How do I breath?" asked pt.  Educated on purse lip breathing.  Avg RA 93%.   Pt plans to return home with [facility] already in progress.     Assistance Recommended at Discharge Intermittent Supervision/Assistance  If plan is discharge home, recommend the following:  Can travel by private vehicle           Equipment Recommendations  None recommended by PT    Recommendations for Other Services       Precautions / Restrictions Precautions Precautions: Fall Precaution Comments: R LE wound wrapped at eval Restrictions Weight Bearing Restrictions: No     Mobility  Bed Mobility               General bed mobility comments: pt seated in recliner when PT arrived    Transfers Overall transfer level: Needs assistance Equipment used: Rolling walker (2 wheels) Transfers: Sit to/from Stand, Bed to chair/wheelchair/BSC Sit to Stand: Supervision, Min guard Stand pivot transfers: Supervision, Min guard         General transfer comment: min cues for proper UE placement, pt able to maintain static standing no UE support.  Slight increase in anxiety.  "How do I  breath?" asked pt.  Educated on purse lip breathing.  Avg RA 93%.    Ambulation/Gait Ambulation/Gait assistance: Supervision, Min guard Gait Distance (Feet): 20 Feet (10 feet x 2) Assistive device: Rolling walker (2 wheels) Gait Pattern/deviations: Decreased step length - right, Decreased step length - left, Trunk flexed Gait velocity: decreased     General Gait Details: pt reported fatigue with ambulation pt able to maintain O2 saturation 94-95% on RA with exertion.  Only agreed to amb to and from bathroom.   Stairs             Wheelchair Mobility     Tilt Bed    Modified Rankin (Stroke Patients Only)       Balance                                            Cognition Arousal/Alertness: Awake/alert Behavior During Therapy: WFL for tasks assessed/performed Overall Cognitive Status: Within Functional Limits for tasks assessed                                 General Comments: AxO x 3 anxious/nervous and slightly disorganized in her thoughts.  She lives home alone  Pt did agree to get OOB and amb to bathroom.  Exercises      General Comments        Pertinent Vitals/Pain Pain Assessment Pain Assessment: Faces Faces Pain Scale: Hurts a little bit Pain Location: B LEs R > L Pain Descriptors / Indicators: Grimacing, Constant, Discomfort Pain Intervention(s): Monitored during session    Home Living                          Prior Function            PT Goals (current goals can now be found in the care plan section) Progress towards PT goals: Progressing toward goals    Frequency    Min 1X/week      PT Plan Current plan remains appropriate    Co-evaluation              AM-PAC PT "6 Clicks" Mobility   Outcome Measure  Help needed turning from your back to your side while in a flat bed without using bedrails?: None Help needed moving from lying on your back to sitting on the side of a flat  bed without using bedrails?: None Help needed moving to and from a bed to a chair (including a wheelchair)?: None Help needed standing up from a chair using your arms (e.g., wheelchair or bedside chair)?: None Help needed to walk in hospital room?: A Little Help needed climbing 3-5 steps with a railing? : A Little 6 Click Score: 22    End of Session Equipment Utilized During Treatment: Gait belt Activity Tolerance: Patient limited by fatigue Patient left: with call bell/phone within reach;in chair Nurse Communication: Mobility status PT Visit Diagnosis: Difficulty in walking, not elsewhere classified (R26.2);Unsteadiness on feet (R26.81);Muscle weakness (generalized) (M62.81);Pain     Time: 1340-1405 PT Time Calculation (min) (ACUTE ONLY): 25 min  Charges:    $Gait Training: 8-22 mins $Therapeutic Activity: 8-22 mins PT General Charges $$ ACUTE PT VISIT: 1 Visit                     Felecia Shelling  PTA Acute  Rehabilitation Services Office M-F          [phone]

## 2022-08-12 ENCOUNTER — Encounter (HOSPITAL_COMMUNITY): Payer: Self-pay | Admitting: Gastroenterology

## 2022-08-12 MED ORDER — FUROSEMIDE 10 MG/ML IJ SOLN
40.0000 mg | Freq: Two times a day (BID) | INTRAMUSCULAR | Status: DC
  Administered 2022-08-12 – 2022-08-13 (×2): 40 mg via INTRAVENOUS
  Filled 2022-08-12 (×2): qty 4

## 2022-08-12 MED ORDER — POTASSIUM CHLORIDE CRYS ER 20 MEQ PO TBCR
60.0000 meq | EXTENDED_RELEASE_TABLET | ORAL | Status: AC
  Administered 2022-08-12 (×2): 60 meq via ORAL
  Filled 2022-08-12 (×2): qty 3

## 2022-08-12 NOTE — Plan of Care (Signed)

## 2022-08-12 NOTE — Progress Notes (Signed)
  Progress Note   Patient: Christine Jacobson ZOX:[id number] DOB: [birth year] DOA: 08/04/2022     8 DOS: the patient was seen and examined on 08/12/2022   Brief hospital course: 67yow complicated PMH including afib on apixaban, recent discharge 7/4 after tx for LE cellultis, presented w/ weaness, found to have low BP, severe anemia. Admitted for GIB and further evaluation. Hemoglobin has stabilized after transfusion. Pt underwent EGD and colon 7/24 with findings of gastritis, hemorrhoids, and diverticulosis. GI has since signed off as of 7/24  Assessment and Plan: Acute blood loss anemia Secondary to GIB Complicated by anticoagulation (apixaban) Associated with hypotension Severe anemia w/ Hgb <6 on admission. Was unaware of acute bleed; no previous hx known. Uses ibuprofen 2 tabs every other day for low back pain. Pt is now s/p 2 units total PRBC transfusion this hospitalization.  Continue PPI twice daily Pt was seen by GI and underwent EGD/colon 7/24 with findings of gastritis, hemorrhoids, diverticulosis and two polyps.    AKI on CKD stage IIIa: Cr improved at 1.05   Hypokalemia: remains low, will correct -Recheck bmet in AM   Acute on chronic diastolic congestive heart failure On admission BNP 1630 (baseline [phone]) Troponins negative. Elevated BNP, some congestion on CXR, but clinically stable   Last echo 2019 w/ normal LVEF, grade 2 diastolic dysfunction Continues with BLE edema, although pt claims swelling is somewhat improved over baseline. Attempted ambulation in hallway with pt reporting increased fatigue  Pt appears to have increased dyspnea on ambulation.  Transition lasix to 40mg  IV BID Follow I/O and daily wts   Atrial fibrillation Acquired thrombophilia On metoprolol and Eliquis at home, currently in normal sinus rhythm Continue BB at lower dose Holding apixaban for above, per GI, OK to resume on 7/27 Once stable cardiology would pt to see [facility] heart clinic as  outpatient for Watchman device.    Bilateral lower extremity cellulitis  Right lower extremity wound Resolved. Legs look good per WOC RN   Essential hypertension BP stable, she is on Toprol-XL.   Class II obesity Body mass index is 33.64 kg/m.  Weight loss and dietary modification counseled.   Pressure injury right buttock stage 2, present on admission    Subjective: Remains anxious, but feeling better today. Reports difficulty ambulating with RN, more so than baseline  Physical Exam: Vitals:   08/11/22 2028 08/12/22 0456 08/12/22 1224 08/12/22 1346  BP: (!) 111/58 115/62  115/85  Pulse: 87 88  80  Resp: 18 18  18   Temp: 98.2 F (36.8 C) 97.8 F (36.6 C)  97.8 F (36.6 C)  TempSrc: Oral Oral  Oral  SpO2: 98% 98% 97% 98%  Weight:      Height:       General exam: Conversant, in no acute distress Respiratory system: normal chest rise, clear, no audible wheezing Cardiovascular system: regular rhythm, s1-s2 Gastrointestinal system: Nondistended, nontender, pos BS Central nervous system: No seizures, no tremors Extremities: No cyanosis, no joint deformities Skin: No rashes, no pallor Psychiatry: Affect normal // no auditory hallucinations   Data Reviewed:  Labs reviewed: Na 139, K 2.8, Cr 1.05, WBC 7.0, Hgb 7.7, Plts 220  Family Communication: Pt in room, family not at bedside  Disposition: Status is: Inpatient Remains inpatient appropriate because: Severity of illness  Planned Discharge Destination: Home with Home Health     Author: Rickey Barbara, MD 08/12/2022 3:49 PM  For on call review [email or web address].

## 2022-08-13 DIAGNOSIS — D62 Acute posthemorrhagic anemia: Secondary | ICD-10-CM | POA: Diagnosis not present

## 2022-08-13 LAB — MAGNESIUM: Magnesium: 1.8 mg/dL (ref 1.7–2.4)

## 2022-08-13 MED ORDER — POTASSIUM CHLORIDE CRYS ER 20 MEQ PO TBCR
60.0000 meq | EXTENDED_RELEASE_TABLET | ORAL | Status: AC
  Administered 2022-08-13 (×2): 60 meq via ORAL
  Filled 2022-08-13 (×2): qty 3

## 2022-08-13 MED ORDER — APIXABAN 5 MG PO TABS
5.0000 mg | ORAL_TABLET | Freq: Two times a day (BID) | ORAL | Status: DC
  Administered 2022-08-13 – 2022-08-14 (×3): 5 mg via ORAL
  Filled 2022-08-13 (×3): qty 1

## 2022-08-13 MED ORDER — TORSEMIDE 20 MG PO TABS
20.0000 mg | ORAL_TABLET | Freq: Two times a day (BID) | ORAL | Status: DC
  Administered 2022-08-13: 20 mg via ORAL
  Filled 2022-08-13 (×2): qty 1

## 2022-08-13 NOTE — Plan of Care (Signed)

## 2022-08-13 NOTE — Progress Notes (Signed)
  Progress Note   Patient: Christine Jacobson QMV:[id number] DOB: Dec 11, 1955 DOA: 08/04/2022     9 DOS: the patient was seen and examined on 08/13/2022   Brief hospital course: 67yow complicated PMH including afib on apixaban, recent discharge 7/4 after tx for LE cellultis, presented w/ weaness, found to have low BP, severe anemia. Admitted for GIB and further evaluation. Hemoglobin has stabilized after transfusion. Pt underwent EGD and colon 7/24 with findings of gastritis, hemorrhoids, and diverticulosis. GI has since signed off as of 7/24  Assessment and Plan: Acute blood loss anemia Secondary to GIB Complicated by anticoagulation (apixaban) Associated with hypotension Severe anemia w/ Hgb <6 on admission. Was unaware of acute bleed; no previous hx known. Uses ibuprofen 2 tabs every other day for low back pain. Pt is now s/p 2 units total PRBC transfusion this hospitalization.  Continue PPI twice daily Pt was seen by GI and underwent EGD/colon 7/24 with findings of gastritis, hemorrhoids, diverticulosis and two polyps.    AKI on CKD stage IIIa: Cr improved at 1.05   Hypokalemia: remains low, will correct -Recheck bmet in AM   Acute on chronic diastolic congestive heart failure On admission BNP 1630 (baseline [phone]) Troponins negative. Elevated BNP, some congestion on CXR, but clinically stable   Last echo 2019 w/ normal LVEF, grade 2 diastolic dysfunction Continues with BLE edema, although pt claims swelling is somewhat improved over baseline. Attempted ambulation in hallway with pt reporting increased fatigue  Pt appears to have increased dyspnea on ambulation.  Improved with lasix to 40mg  IV BID. Will transition to torsemide 20mg  bid as was recommended by pt's Cardiologist on 5/31. Clinic notes reviewed Follow I/O and daily wts   Atrial fibrillation Acquired thrombophilia On metoprolol and Eliquis at home, currently in normal sinus rhythm Continue BB at lower dose Holding apixaban  for above, per GI, OK to resume on 7/27 Once stable cardiology would pt to see structural heart clinic as outpatient for Watchman device.    Bilateral lower extremity cellulitis  Right lower extremity wound Resolved. Legs look good per WOC RN   Essential hypertension BP stable, she is on Toprol-XL.   Class II obesity Body mass index is 33.64 kg/m.  Weight loss and dietary modification counseled.   Pressure injury right buttock stage 2, present on admission    Subjective: Feeling better today  Physical Exam: Vitals:   08/13/22 0454 08/13/22 0500 08/13/22 1315 08/13/22 1406  BP: 105/67  (!) 80/53 92/61  Pulse: 91  89 90  Resp: 16  18   Temp: 97.9 F (36.6 C)  98.5 F (36.9 C)   TempSrc: Oral     SpO2: 96%  97% 99%  Weight:  91.1 kg    Height:       General exam: Awake, laying in bed, in nad Respiratory system: Normal respiratory effort, no wheezing Cardiovascular system: regular rate, s1, s2 Gastrointestinal system: Soft, nondistended, positive BS Central nervous system: CN2-12 grossly intact, strength intact Extremities: Perfused, no clubbing Skin: Normal skin turgor, no notable skin lesions seen Psychiatry: Mood normal // no visual hallucinations   Data Reviewed:  Labs reviewed: Na 140, K 3.1, cr 1.16, Hgb 8.9  Family Communication: Pt in room, family not at bedside  Disposition: Status is: Inpatient Remains inpatient appropriate because: Severity of illness  Planned Discharge Destination: Home with Home Health     Author: Rickey Barbara, MD 08/13/2022 3:28 PM  For on call review [email or web address].

## 2022-08-14 DIAGNOSIS — D62 Acute posthemorrhagic anemia: Secondary | ICD-10-CM | POA: Diagnosis not present

## 2022-08-14 LAB — CBC
HCT: 26.5 % — ABNORMAL LOW (ref 36.0–46.0)
Hemoglobin: 8 g/dL — ABNORMAL LOW (ref 12.0–15.0)
MCH: 28.5 pg (ref 26.0–34.0)
MCHC: 30.2 g/dL (ref 30.0–36.0)
MCV: 94.3 fL (ref 80.0–100.0)
Platelets: 235 10*3/uL (ref 150–400)
RBC: 2.81 MIL/uL — ABNORMAL LOW (ref 3.87–5.11)
RDW: 17.1 % — ABNORMAL HIGH (ref 11.5–15.5)
WBC: 8.1 10*3/uL (ref 4.0–10.5)
nRBC: 0 % (ref 0.0–0.2)

## 2022-08-14 LAB — COMPREHENSIVE METABOLIC PANEL WITH GFR
ALT: 21 U/L (ref 0–44)
AST: 30 U/L (ref 15–41)
Albumin: 3.2 g/dL — ABNORMAL LOW (ref 3.5–5.0)
Alkaline Phosphatase: 174 U/L — ABNORMAL HIGH (ref 38–126)
Anion gap: 10 (ref 5–15)
BUN: 14 mg/dL (ref 8–23)
CO2: 25 mmol/L (ref 22–32)
Calcium: 8.9 mg/dL (ref 8.9–10.3)
Chloride: 104 mmol/L (ref 98–111)
Creatinine, Ser: 1.15 mg/dL — ABNORMAL HIGH (ref 0.44–1.00)
GFR, Estimated: 52 mL/min — ABNORMAL LOW (ref >60)
Glucose, Bld: 96 mg/dL (ref 70–99)
Potassium: 3.9 mmol/L (ref 3.5–5.1)
Sodium: 139 mmol/L (ref 135–145)
Total Bilirubin: 0.8 mg/dL (ref 0.3–1.2)
Total Protein: 6.1 g/dL — ABNORMAL LOW (ref 6.5–8.1)

## 2022-08-14 MED ORDER — MIDODRINE HCL 5 MG PO TABS: 5.0000 mg | ORAL_TABLET | Freq: Two times a day (BID) | ORAL | 0 refills | Status: DC

## 2022-08-14 MED ORDER — TORSEMIDE 20 MG PO TABS: 20.0000 mg | ORAL_TABLET | Freq: Two times a day (BID) | ORAL | 0 refills | Status: DC

## 2022-08-14 MED ORDER — PANTOPRAZOLE SODIUM 40 MG PO TBEC
40.0000 mg | DELAYED_RELEASE_TABLET | Freq: Every day | ORAL | 3 refills | Status: DC
  Filled 2022-08-24: qty 90, 90d supply, fill #0

## 2022-08-14 NOTE — Care Plan (Signed)
Discharge instructions given. Pt A&O, VSS. IV removed. Ready for discharge.

## 2022-08-14 NOTE — Discharge Summary (Signed)
Physician Discharge Summary   Patient: Christine Jacobson MRN: [id number] DOB: 03-25-1955  Admit date:     08/04/2022  Discharge date: 08/14/22  Discharge Physician: Rickey Barbara   PCP: Karenann Cai, NP   Recommendations at discharge:    Follow up with PCP in 1-2 weeks Follow up with GI as scheduled Follow up with Cardiology as scheduled  Discharge Diagnoses: Principal Problem:   Acute blood loss anemia Active Problems:   Hypertension   Chronic diastolic heart failure (HCC)   Obesity (BMI 30-39.9)   Persistent atrial fibrillation (HCC)   AKI (acute kidney injury) (HCC)   GIB (gastrointestinal bleeding)   Acquired thrombophilia (HCC)   Chronic anticoagulation  Resolved Problems:   * No resolved hospital problems. [facility] Course: 67yow complicated PMH including afib on apixaban, recent discharge 7/4 after tx for LE cellultis, presented w/ weaness, found to have low BP, severe anemia. Admitted for GIB and further evaluation. Hemoglobin has stabilized after transfusion. Pt underwent EGD and colon 7/24 with findings of gastritis, hemorrhoids, and diverticulosis. GI has since signed off as of 7/24   Assessment and Plan: Acute blood loss anemia Secondary to GIB Complicated by anticoagulation (apixaban) Associated with hypotension Severe anemia w/ Hgb <6 on admission. Was unaware of acute bleed; no previous hx known. Uses ibuprofen 2 tabs every other day for low back pain. Pt is now s/p 2 units total PRBC transfusion this hospitalization.  Continue PPI twice daily Pt was seen by GI and underwent EGD/colon 7/24 with findings of gastritis, hemorrhoids, diverticulosis and two polyps.    AKI on CKD stage IIIa: Cr improved   Hypokalemia: corrected   Acute on chronic diastolic congestive heart failure On admission BNP 1630 (baseline [phone]) Troponins negative. Elevated BNP, some congestion on CXR, but clinically stable   Last echo 2019 w/ normal LVEF, grade 2 diastolic  dysfunction Continues with BLE edema, although pt claims swelling is somewhat improved over baseline. Improved with course of IV lasix .  Transitioned to torsemide 20mg  bid as was recommended by pt's Cardiologist on 5/31. Clinic notes reviewed Follow I/O and daily wts   Atrial fibrillation Acquired thrombophilia On metoprolol and Eliquis at home, currently in normal sinus rhythm Continue BB at lower dose Holding apixaban for above, per GI, OK to resume on 7/27 Once stable cardiology would pt to see structural heart clinic as outpatient for Watchman device.    Bilateral lower extremity cellulitis  Right lower extremity wound Resolved. Legs look good per WOC RN   Essential hypertension BP stable, she is on Toprol-XL. Had been continued on midodrine chronically   Class II obesity Body mass index is 33.64 kg/m.  Weight loss and dietary modification counseled.       Consultants: GI Procedures performed: EGD, Colonoscopy  Disposition: Home Diet recommendation:  Cardiac diet DISCHARGE MEDICATION: Allergies as of 08/14/2022       Reactions   Ativan [lorazepam] Other (See Comments)   "Is not effective"   Pork-derived Products Other (See Comments)   Religious preference   Tylenol [acetaminophen] Nausea Only, Other (See Comments)   Upsets the stomach        Medication List     STOP taking these medications    cephALEXin 500 MG capsule Commonly known as: KEFLEX   furosemide 40 MG tablet Commonly known as: LASIX   Ibuprofen 200 MG Caps   polyethylene glycol powder 17 GM/SCOOP powder Commonly known as: GLYCOLAX/MIRALAX       TAKE these medications  apixaban 5 MG Tabs tablet Commonly known as: Eliquis Take 1 tablet (5 mg total) by mouth 2 (two) times daily.   ascorbic acid 500 MG tablet Commonly known as: VITAMIN C Take 1 tablet (500 mg total) by mouth daily for 30 days   hydrOXYzine 10 MG tablet Commonly known as: ATARAX Take 1 tablet (10 mg total) by  mouth 3 (three) times daily as needed for anxiety.   metoprolol succinate 100 MG 24 hr tablet Commonly known as: TOPROL-XL TAKE ONE TABLET BY MOUTH TWICE A DAY WITH OR IMMEDIATELY FOLLOWING A MEAL What changed:  how much to take how to take this when to take this additional instructions   midodrine 5 MG tablet Commonly known as: PROAMATINE Take 1 tablet (5 mg total) by mouth 2 (two) times daily with a meal.   mineral oil-hydrophilic petrolatum ointment Apply topically as needed for dry skin. Use with Unna boots please at the back of the wounds on the lower extremities when the Unna boots are changed   pantoprazole 40 MG tablet Commonly known as: PROTONIX Take 1 tablet (40 mg total) by mouth daily.   potassium chloride SA 20 MEQ tablet Commonly known as: KLOR-CON M Take 2 tablets (40 mEq total) by mouth 2 (two) times daily. What changed: how much to take   torsemide 20 MG tablet Commonly known as: DEMADEX Take 1 tablet (20 mg total) by mouth 2 (two) times daily.   Zinc Sulfate 220 (50 Zn) MG Tabs Take 1 tablet (220 mg total) by mouth daily for 30 days        Follow-up Information     Karenann Cai, NP Follow up in 2 week(s).   Specialty: Nurse Practitioner Why: Hospital follow up Contact information: [address] [place] [address] [phone]         Thomasene Ripple, DO Follow up.   Specialty: Cardiology Why: as scheduled Contact information: [address] [place] [address] [phone]         Willis Modena, MD Follow up.   Specialty: Gastroenterology Why: as will be scheduled Contact information: [address] [place] [address] [phone]                Discharge Exam: Ceasar Mons Weights   08/10/22 1305 08/13/22 0500 08/14/22 0700  Weight: 91.1 kg 91.1 kg 89 kg   General exam: Awake, laying in bed, in nad Respiratory system: Normal respiratory effort, no wheezing Cardiovascular system:  regular rate, s1, s2 Gastrointestinal system: Soft, nondistended, positive BS Central nervous system: CN2-12 grossly intact, strength intact Extremities: Perfused, no clubbing Skin: Normal skin turgor, no notable skin lesions seen Psychiatry: Mood normal // no visual hallucinations   Condition at discharge: fair  The results of significant diagnostics from this hospitalization (including imaging, microbiology, ancillary and laboratory) are listed below for reference.   Imaging Studies: DG Chest Port 1 View  Result Date: 08/04/2022 CLINICAL DATA:  Chest pain. EXAM: PORTABLE CHEST 1 VIEW COMPARISON:  One-view chest x-ray 07/16/2022 FINDINGS: The heart is enlarged. Mild pulmonary vascular congestion is present without frank edema. No focal airspace disease present. No effusions are present. The visualized soft tissues bony thorax are normal. IMPRESSION: Cardiomegaly and mild pulmonary vascular congestion without frank edema. Electronically Signed   By: Marin Roberts M.D.   On: 08/04/2022 14:48   DG CHEST PORT 1 VIEW  Result Date: 07/16/2022 CLINICAL DATA:  Shortness of breath. EXAM: PORTABLE CHEST 1 VIEW COMPARISON:  06/02/2022 FINDINGS: Stable cardiomegaly.  Both lungs are clear. IMPRESSION: Stable cardiomegaly.  No active lung disease. Electronically Signed   By: Danae Orleans M.D.   On: 07/16/2022 14:10   CT TIBIA FIBULA RIGHT W CONTRAST  Result Date: 07/15/2022 CLINICAL DATA:  Soft tissue infection suspected. EXAM: CT OF THE LOWER RIGHT EXTREMITY WITH CONTRAST TECHNIQUE: Multidetector CT imaging of the lower right extremity was performed according to the standard protocol following intravenous contrast administration. RADIATION DOSE REDUCTION: This exam was performed according to the departmental dose-optimization program which includes automated exposure control, adjustment of the mA and/or kV according to patient size and/or use of iterative reconstruction technique. CONTRAST:   OMNIPAQUE IOHEXOL 300 MG/ML  SOLN COMPARISON:  None Available. FINDINGS: Bones/Joint/Cartilage No evidence of fracture or dislocation. No cortical erosion or periosteal reaction to suggest osteomyelitis. Mild degenerative changes of the patellofemoral compartment. No appreciable joint effusion. Ligaments Suboptimally assessed by CT. Muscles and Tendons Muscles are normal in bulk. No intramuscular hematoma or fluid collection. Tendons of the flexor, extensor and peroneal compartments are intact. Achilles tendon is intact. Soft tissues Skin thickening and subcutaneous soft tissue edema prominent about the lower leg suggesting severe cellulitis. No fluid collection or abscess on this noncontrast examination. IMPRESSION: 1. Skin thickening and subcutaneous soft tissue edema prominent about the lower leg suggesting cellulitis. No fluid collection or abscess on this noncontrast enhanced examination. 2. No evidence of fracture or dislocation. No CT evidence of osteomyelitis. Electronically Signed   By: Larose Hires D.O.   On: 07/15/2022 16:35   CT TIBIA FIBULA LEFT W CONTRAST  Result Date: 07/15/2022 CLINICAL DATA:  Soft tissue infection suspected. EXAM: CT OF THE LOWER LEFT EXTREMITY WITH CONTRAST TECHNIQUE: Multidetector CT imaging of the lower left extremity was performed according to the standard protocol following intravenous contrast administration. RADIATION DOSE REDUCTION: This exam was performed according to the departmental dose-optimization program which includes automated exposure control, adjustment of the mA and/or kV according to patient size and/or use of iterative reconstruction technique. CONTRAST:  OMNIPAQUE IOHEXOL 300 MG/ML  SOLN COMPARISON:  None Available. FINDINGS: Bones/Joint/Cartilage No evidence of fracture or dislocation. No cortical erosion or periosteal reaction to suggest osteomyelitis. Degenerative changes of the multiple midfoot joints. Degenerative changes of the knee joint with  subchondral cystic changes in the patellofemoral lateral tibiofemoral compartments. Ligaments Suboptimally assessed by CT. Muscles and Tendons Muscles are normal in bulk. No intramuscular hematoma or fluid collection. Tendons of the flexor and extensor compartments are intact. Achilles tendon is intact. Soft tissues Marked skin thickening about the lateral aspect of the leg with subcutaneous soft tissue edema suggesting cellulitis. No fluid collection or abscess, noncontrast examination limits evaluation. IMPRESSION: 1. Marked skin thickening about the lateral aspect of the leg with subcutaneous soft tissue edema suggesting cellulitis. No fluid collection or abscess, noncontrast examination limits evaluation. 2. No evidence of fracture or dislocation. No cortical erosion or periosteal reaction to suggest osteomyelitis. 3. Degenerative changes of the knee and multiple midfoot joints. Electronically Signed   By: Larose Hires D.O.   On: 07/15/2022 16:32    Microbiology: Results for orders placed or performed during the hospital encounter of 07/13/22  Blood culture (routine x 2)     Status: None   Collection Time: 07/13/22 12:15 PM   Specimen: BLOOD  Result Value Ref Range Status   Specimen Description   Final    BLOOD BLOOD LEFT FOREARM Performed at [facility], [address] [place],  [place] [address]    Special Requests   Final    BOTTLES DRAWN AEROBIC AND ANAEROBIC Blood Culture adequate volume Performed at [facility], [address] [place], [place] [address]    Culture   Final    NO GROWTH 5 DAYS Performed at [facility], [address], [place], [place] [address]    Report Status 07/18/2022 FINAL  Final  Blood culture (routine x 2)     Status: None   Collection Time: 07/13/22 12:23 PM   Specimen: BLOOD  Result Value Ref Range Status   Specimen Description   Final    BLOOD BLOOD RIGHT ARM Performed at [facility], [address], [place], [place] [address]    Special Requests   Final    BOTTLES DRAWN AEROBIC AND ANAEROBIC Blood Culture adequate volume Performed at [facility], [address] [place], [place] [address]    Culture   Final    NO GROWTH 5 DAYS Performed at [facility], [address], [place], [place] [address]    Report Status 07/18/2022 FINAL  Final    Labs: CBC: Recent Labs  Lab 08/09/22 0615 08/10/22 0926 08/12/22 0523 08/13/22 0823 08/14/22 0733  WBC 6.8 8.3 7.0 9.4 8.1  HGB 7.8* 9.2* 7.7* 8.9* 8.0*  HCT 26.0* 30.6* 25.8* 28.9* 26.5*  MCV 95.9 95.9 93.8 94.1 94.3  PLT 248 293 220 261 235   Basic Metabolic Panel: Recent Labs  Lab 08/09/22 0615 08/10/22 0926 08/12/22 0523 08/13/22 0823 08/14/22 0733  NA 142 145 139 140 139  K 3.3* 3.3* 2.8* 3.1* 3.9  CL 110 110 103 104 104  CO2 25 22 26 25 25   GLUCOSE 103* 189* 96 140* 96  BUN 21 14 14 17 14   CREATININE 1.02* 1.15* 1.05* 1.16* 1.15*  CALCIUM 8.8* 9.6 8.8* 9.1 8.9  MG  --   --   --  1.8  --    Liver Function Tests: Recent Labs  Lab 08/12/22 0523 08/13/22 0823 08/14/22 0733  AST 25 28 30   ALT 18 20 21   ALKPHOS 153* 171* 174*  BILITOT 1.0 1.1 0.8  PROT 5.7* 6.3* 6.1*  ALBUMIN 3.1* 3.5 3.2*   CBG: No results for input(s): "GLUCAP" in the last 168 hours.  Discharge time spent: less than 30 minutes.  Signed: Rickey Barbara, MD [facility] 08/14/2022

## 2022-08-14 NOTE — Plan of Care (Signed)

## 2022-08-22 ENCOUNTER — Other Ambulatory Visit (HOSPITAL_COMMUNITY): Payer: Self-pay

## 2022-08-23 ENCOUNTER — Other Ambulatory Visit (HOSPITAL_COMMUNITY): Payer: Self-pay

## 2022-08-24 ENCOUNTER — Other Ambulatory Visit: Payer: Self-pay

## 2022-08-24 ENCOUNTER — Other Ambulatory Visit (HOSPITAL_COMMUNITY): Payer: Self-pay

## 2022-08-24 MED ORDER — ATORVASTATIN CALCIUM 40 MG PO TABS
40.0000 mg | ORAL_TABLET | Freq: Every day | ORAL | 3 refills | Status: DC
  Filled 2022-08-24: qty 90, 90d supply, fill #0

## 2022-08-24 MED ORDER — TORSEMIDE 20 MG PO TABS
20.0000 mg | ORAL_TABLET | Freq: Two times a day (BID) | ORAL | 3 refills | Status: DC
  Filled 2022-08-24: qty 180, 90d supply, fill #0

## 2022-08-24 MED ORDER — MIDODRINE HCL 5 MG PO TABS
5.0000 mg | ORAL_TABLET | Freq: Two times a day (BID) | ORAL | 3 refills | Status: DC
  Filled 2022-08-24: qty 180, 90d supply, fill #0

## 2022-08-24 MED ORDER — POTASSIUM CHLORIDE CRYS ER 20 MEQ PO TBCR
20.0000 meq | EXTENDED_RELEASE_TABLET | Freq: Every day | ORAL | 3 refills | Status: DC
  Filled 2022-08-24: qty 90, 90d supply, fill #0

## 2022-09-06 ENCOUNTER — Other Ambulatory Visit: Payer: Self-pay

## 2022-09-15 ENCOUNTER — Other Ambulatory Visit: Payer: Self-pay

## 2022-09-15 ENCOUNTER — Encounter: Payer: Self-pay | Admitting: Cardiology

## 2022-09-15 ENCOUNTER — Encounter (HOSPITAL_COMMUNITY): Payer: Self-pay

## 2022-09-15 ENCOUNTER — Emergency Department (HOSPITAL_COMMUNITY): Payer: Medicare PPO

## 2022-09-15 ENCOUNTER — Inpatient Hospital Stay (HOSPITAL_COMMUNITY)
Admission: EM | Admit: 2022-09-15 | Discharge: 2022-09-21 | DRG: 291 | Disposition: A | Discharge status: Home Health | Payer: Medicare PPO | Attending: Family Medicine | Admitting: Family Medicine

## 2022-09-15 ENCOUNTER — Ambulatory Visit: Payer: Medicare HMO | Attending: Cardiology | Admitting: Cardiology

## 2022-09-15 VITALS — BP 86/58 | HR 85 | Ht 64.0 in | Wt 185.6 lb

## 2022-09-15 DIAGNOSIS — I361 Nonrheumatic tricuspid (valve) insufficiency: Secondary | ICD-10-CM | POA: Diagnosis not present

## 2022-09-15 DIAGNOSIS — Z825 Family history of asthma and other chronic lower respiratory diseases: Secondary | ICD-10-CM

## 2022-09-15 DIAGNOSIS — I5033 Acute on chronic diastolic (congestive) heart failure: Secondary | ICD-10-CM | POA: Diagnosis present

## 2022-09-15 DIAGNOSIS — I10 Essential (primary) hypertension: Secondary | ICD-10-CM | POA: Diagnosis not present

## 2022-09-15 DIAGNOSIS — Z8041 Family history of malignant neoplasm of ovary: Secondary | ICD-10-CM

## 2022-09-15 DIAGNOSIS — E785 Hyperlipidemia, unspecified: Secondary | ICD-10-CM | POA: Diagnosis present

## 2022-09-15 DIAGNOSIS — I4819 Other persistent atrial fibrillation: Secondary | ICD-10-CM | POA: Diagnosis present

## 2022-09-15 DIAGNOSIS — I272 Pulmonary hypertension, unspecified: Secondary | ICD-10-CM | POA: Diagnosis present

## 2022-09-15 DIAGNOSIS — I110 Hypertensive heart disease with heart failure: Secondary | ICD-10-CM | POA: Diagnosis not present

## 2022-09-15 DIAGNOSIS — I4891 Unspecified atrial fibrillation: Secondary | ICD-10-CM

## 2022-09-15 DIAGNOSIS — I340 Nonrheumatic mitral (valve) insufficiency: Secondary | ICD-10-CM | POA: Diagnosis present

## 2022-09-15 DIAGNOSIS — Z66 Do not resuscitate: Secondary | ICD-10-CM | POA: Diagnosis present

## 2022-09-15 DIAGNOSIS — G8929 Other chronic pain: Secondary | ICD-10-CM | POA: Diagnosis present

## 2022-09-15 DIAGNOSIS — F419 Anxiety disorder, unspecified: Secondary | ICD-10-CM | POA: Diagnosis present

## 2022-09-15 DIAGNOSIS — N179 Acute kidney failure, unspecified: Secondary | ICD-10-CM | POA: Diagnosis present

## 2022-09-15 DIAGNOSIS — M7989 Other specified soft tissue disorders: Secondary | ICD-10-CM

## 2022-09-15 DIAGNOSIS — R0602 Shortness of breath: Secondary | ICD-10-CM

## 2022-09-15 DIAGNOSIS — I959 Hypotension, unspecified: Secondary | ICD-10-CM | POA: Diagnosis present

## 2022-09-15 DIAGNOSIS — I5032 Chronic diastolic (congestive) heart failure: Secondary | ICD-10-CM | POA: Diagnosis not present

## 2022-09-15 DIAGNOSIS — I878 Other specified disorders of veins: Secondary | ICD-10-CM | POA: Diagnosis present

## 2022-09-15 DIAGNOSIS — E669 Obesity, unspecified: Secondary | ICD-10-CM | POA: Diagnosis present

## 2022-09-15 DIAGNOSIS — Z833 Family history of diabetes mellitus: Secondary | ICD-10-CM

## 2022-09-15 DIAGNOSIS — N1831 Chronic kidney disease, stage 3a: Secondary | ICD-10-CM | POA: Diagnosis present

## 2022-09-15 DIAGNOSIS — F17210 Nicotine dependence, cigarettes, uncomplicated: Secondary | ICD-10-CM | POA: Diagnosis present

## 2022-09-15 DIAGNOSIS — D631 Anemia in chronic kidney disease: Secondary | ICD-10-CM | POA: Diagnosis present

## 2022-09-15 DIAGNOSIS — D509 Iron deficiency anemia, unspecified: Secondary | ICD-10-CM | POA: Diagnosis present

## 2022-09-15 DIAGNOSIS — Z8249 Family history of ischemic heart disease and other diseases of the circulatory system: Secondary | ICD-10-CM

## 2022-09-15 DIAGNOSIS — Z91148 Patient's other noncompliance with medication regimen for other reason: Secondary | ICD-10-CM

## 2022-09-15 DIAGNOSIS — Z7901 Long term (current) use of anticoagulants: Secondary | ICD-10-CM

## 2022-09-15 DIAGNOSIS — I130 Hypertensive heart and chronic kidney disease with heart failure and stage 1 through stage 4 chronic kidney disease, or unspecified chronic kidney disease: Secondary | ICD-10-CM | POA: Diagnosis present

## 2022-09-15 DIAGNOSIS — I3481 Nonrheumatic mitral (valve) annulus calcification: Secondary | ICD-10-CM | POA: Diagnosis present

## 2022-09-15 DIAGNOSIS — G479 Sleep disorder, unspecified: Secondary | ICD-10-CM | POA: Diagnosis present

## 2022-09-15 DIAGNOSIS — Z79899 Other long term (current) drug therapy: Secondary | ICD-10-CM

## 2022-09-15 DIAGNOSIS — I421 Obstructive hypertrophic cardiomyopathy: Secondary | ICD-10-CM | POA: Diagnosis present

## 2022-09-15 DIAGNOSIS — Z5941 Food insecurity: Secondary | ICD-10-CM

## 2022-09-15 DIAGNOSIS — I480 Paroxysmal atrial fibrillation: Secondary | ICD-10-CM | POA: Diagnosis not present

## 2022-09-15 DIAGNOSIS — F32A Depression, unspecified: Secondary | ICD-10-CM | POA: Diagnosis present

## 2022-09-15 DIAGNOSIS — I509 Heart failure, unspecified: Secondary | ICD-10-CM

## 2022-09-15 DIAGNOSIS — M25551 Pain in right hip: Secondary | ICD-10-CM | POA: Diagnosis present

## 2022-09-15 DIAGNOSIS — D649 Anemia, unspecified: Secondary | ICD-10-CM | POA: Diagnosis present

## 2022-09-15 DIAGNOSIS — Z87891 Personal history of nicotine dependence: Secondary | ICD-10-CM | POA: Diagnosis not present

## 2022-09-15 DIAGNOSIS — Z6834 Body mass index (BMI) 34.0-34.9, adult: Secondary | ICD-10-CM

## 2022-09-15 LAB — BASIC METABOLIC PANEL
Anion gap: 11 (ref 5–15)
BUN: 18 mg/dL (ref 8–23)
CO2: 16 mmol/L — ABNORMAL LOW (ref 22–32)
Calcium: 9.8 mg/dL (ref 8.9–10.3)
Chloride: 108 mmol/L (ref 98–111)
Creatinine, Ser: 1.26 mg/dL — ABNORMAL HIGH (ref 0.44–1.00)
GFR, Estimated: 46 mL/min — ABNORMAL LOW (ref >60)
Glucose, Bld: 117 mg/dL — ABNORMAL HIGH (ref 70–99)
Potassium: 4.6 mmol/L (ref 3.5–5.1)
Sodium: 135 mmol/L (ref 135–145)

## 2022-09-15 LAB — CBC
HCT: 33.1 % — ABNORMAL LOW (ref 36.0–46.0)
Hemoglobin: 9.8 g/dL — ABNORMAL LOW (ref 12.0–15.0)
MCH: 25.5 pg — ABNORMAL LOW (ref 26.0–34.0)
MCHC: 29.6 g/dL — ABNORMAL LOW (ref 30.0–36.0)
MCV: 86 fL (ref 80.0–100.0)
Platelets: 318 10*3/uL (ref 150–400)
RBC: 3.85 MIL/uL — ABNORMAL LOW (ref 3.87–5.11)
RDW: 18.1 % — ABNORMAL HIGH (ref 11.5–15.5)
WBC: 9.9 10*3/uL (ref 4.0–10.5)
nRBC: 0 % (ref 0.0–0.2)

## 2022-09-15 LAB — TROPONIN I (HIGH SENSITIVITY)
Troponin I (High Sensitivity): 13 ng/L (ref <18)
Troponin I (High Sensitivity): 14 ng/L (ref <18)

## 2022-09-15 LAB — BRAIN NATRIURETIC PEPTIDE: B Natriuretic Peptide: 481.6 pg/mL — ABNORMAL HIGH (ref 0.0–100.0)

## 2022-09-15 MED ORDER — ACETAMINOPHEN 325 MG PO TABS
650.0000 mg | ORAL_TABLET | Freq: Once | ORAL | Status: AC
  Administered 2022-09-15: 650 mg via ORAL
  Filled 2022-09-15: qty 2

## 2022-09-15 MED ORDER — APIXABAN 5 MG PO TABS
5.0000 mg | ORAL_TABLET | Freq: Two times a day (BID) | ORAL | Status: DC
  Administered 2022-09-15 – 2022-09-21 (×12): 5 mg via ORAL
  Filled 2022-09-15 (×12): qty 1

## 2022-09-15 MED ORDER — HYDROXYZINE HCL 10 MG PO TABS
10.0000 mg | ORAL_TABLET | Freq: Three times a day (TID) | ORAL | Status: DC | PRN
  Filled 2022-09-15: qty 1

## 2022-09-15 MED ORDER — PANTOPRAZOLE SODIUM 40 MG PO TBEC
40.0000 mg | DELAYED_RELEASE_TABLET | Freq: Every day | ORAL | Status: DC
  Administered 2022-09-15 – 2022-09-21 (×7): 40 mg via ORAL
  Filled 2022-09-15 (×7): qty 1

## 2022-09-15 MED ORDER — MIDODRINE HCL 5 MG PO TABS: 5.0000 mg | ORAL_TABLET | Freq: Two times a day (BID) | ORAL | Status: DC

## 2022-09-15 MED ORDER — FUROSEMIDE 10 MG/ML IJ SOLN
40.0000 mg | Freq: Once | INTRAMUSCULAR | Status: AC
  Administered 2022-09-15: 40 mg via INTRAVENOUS
  Filled 2022-09-15: qty 4

## 2022-09-15 MED ORDER — METOPROLOL SUCCINATE ER 25 MG PO TB24: 100.0000 mg | ORAL_TABLET | Freq: Two times a day (BID) | ORAL | Status: DC

## 2022-09-15 MED ORDER — MIDODRINE HCL 5 MG PO TABS
5.0000 mg | ORAL_TABLET | Freq: Two times a day (BID) | ORAL | Status: DC
  Administered 2022-09-15 – 2022-09-21 (×12): 5 mg via ORAL
  Filled 2022-09-15 (×12): qty 1

## 2022-09-15 MED ORDER — ATORVASTATIN CALCIUM 40 MG PO TABS
40.0000 mg | ORAL_TABLET | Freq: Every day | ORAL | Status: DC
  Administered 2022-09-15 – 2022-09-16 (×2): 40 mg via ORAL
  Filled 2022-09-15 (×2): qty 1

## 2022-09-15 NOTE — Hospital Course (Addendum)
Christine Jacobson is a 67 y.o.female with a history of CHF, Afib-on Eliquis who was admitted to the [facility] at [facility] for dyspnea. Her hospital course is detailed below:  Dyspnea  Heart Failure Exacerbation?  Patient presented initially from cardiology A-fib clinic due to increased shortness of breath since yesterday. She was instructed to come to the [facility] by cardiology for concern of volume overload. CXR with chronic changes, nothing new. BNP in the 400s, which is lower than it was during previous exacerbations  Cardiology recommended 80 mg Lasix BID and admission to the hospital.  Lasix was held due to hypotension.  Afib  Cardiology continued with cardioversion ***  AKI   Other chronic conditions were medically managed with home medications and formulary alternatives as necessary (***)  PCP Follow-up Recommendations:

## 2022-09-15 NOTE — Assessment & Plan Note (Addendum)
Patient is rate controlled, on eliquis at home.  - Cardiology plans for DCCV tomorrow - NPO after midnight - Patient has been rate controlled since arrival to the ED - Continued home Eliquis

## 2022-09-15 NOTE — Consult Note (Addendum)
Cardiology Consultation   Christine Jacobson ID: Wrynn Gobrecht MRN: [id number]; DOB: May 13, 1955  Admit date: 09/15/2022 Date of Consult: 09/15/2022  PCP:  Karenann Cai, NP   [facility] Providers Cardiologist:  Thomasene Ripple, DO        Christine Jacobson Profile:   Christine Jacobson is a 67 y.o. female with a hx of obesity, hypertrophic cardiomyopathy with gradient of 64 mmHg that increases but Valsalva, chronic diastolic heart failure, PAF s/p prior DCCV, hypertension and hyperlipidemia who is being seen 09/15/2022 for the evaluation of acute CHF and recurrent afib at the request of Dr. Theresia Lo.  History of Present Illness:   Christine Jacobson is a 67 year old female with past medical history of obesity, hypertrophic cardiomyopathy with gradient of 64 mmHg that increases but Valsalva, chronic diastolic heart failure, PAF s/p prior DCCV, hypertension and hyperlipidemia.  Christine Jacobson was previously seen by Dr. Servando Salina in May 2024 at which time she was not taking her Lasix.  She was encouraged to be more compliant with her diuretic.  She was admitted in June 2024 with cellulitis of bilateral lower extremity.  She was also recently admitted with acute blood loss anemia that required a blood transfusion.  She underwent a EGD and colonoscopy on 08/06/2022 that showed gastritis, hemorrhoids and diverticulosis.  During the hospitalization, she was treated for acute on chronic diastolic heart failure.  BNP on admission was 1630.  She underwent IV diuresis.  Dr. Servando Salina felt she likely has some degree of depression and was not taking her medication.  She has chronic right lower extremity wound that required wound care.  She was seen by Dr. Servando Salina today in the cardiology office at which time she complained of significant dyspnea with minimal activity.  She has not been taking her Lasix and has significant lower extremity edema.  She sleeps upright at night.  She denies any PND.  EKG shows she is back in rate controlled atrial  fibrillation.  She was hypotensive and was not taking her midodrine either.  Christine Jacobson was subsequently sent to [facility], ED for further evaluation.  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   2D ECHO - EF >55%, normal   HTN (hypertension)    Hyperprolactinemia (HCC)    dx in her 18, took meds, self d/c a while back   Liver function test abnormality    normal when repeated   Mild hyperlipidemia    Obesity    Palpitations    negative stress echo in November 2012 with normal LV function; mild LVH, proximal septal thickening with narrow LVOT and mild gradient; mild MR and TR; Cardionet showed PACs in November 2012   Pyoderma gangrenosa    Shortness of breath 07/11/2011   MET TEST    Past Surgical History:  Procedure Laterality Date   BIOPSY  08/10/2022   Procedure: BIOPSY;  Surgeon: Willis Modena, MD;  Location: [facility];  Service: Gastroenterology;;   CARDIOVERSION N/A 02/01/2022   Procedure: CARDIOVERSION;  Surgeon: Thomasene Ripple, DO;  Location: [facility];  Service: Cardiovascular;  Laterality: N/A;   COLONOSCOPY WITH PROPOFOL Left 08/10/2022   Procedure: COLONOSCOPY WITH PROPOFOL;  Surgeon: Willis Modena, MD;  Location: [facility];  Service: Gastroenterology;  Laterality: Left;   ESOPHAGOGASTRODUODENOSCOPY (EGD) WITH PROPOFOL Left 08/10/2022   Procedure: ESOPHAGOGASTRODUODENOSCOPY (  EGD) WITH PROPOFOL;  Surgeon: Willis Modena, MD;  Location: [facility];  Service: Gastroenterology;  Laterality: Left;   POLYPECTOMY  08/10/2022   Procedure: POLYPECTOMY;  Surgeon: Willis Modena, MD;  Location: [facility];  Service: Gastroenterology;;   RIGHT HEART CATH N/A 10/15/2021   Procedure: RIGHT HEART CATH;  Surgeon: Elder Negus, MD;  Location: [facility]  INVASIVE CV LAB;  Service: Cardiovascular;  Laterality: N/A;   SKIN GRAFT  2006   porcine, R leg     Home Medications:  Prior to Admission medications   Medication Sig Start Date End Date Taking? Authorizing Provider  apixaban (ELIQUIS) 5 MG TABS tablet Take 1 tablet (5 mg total) by mouth 2 (two) times daily. 02/23/22  Yes Tobb, Kardie, DO  ascorbic acid (VITAMIN C) 500 MG tablet Take 1 tablet (500 mg total) by mouth daily for 30 days 06/10/22  Yes Leroy Sea, MD  atorvastatin (LIPITOR) 40 MG tablet Take 1 tablet (40 mg total) by mouth daily. 07/01/22  Yes   hydrOXYzine (ATARAX) 10 MG tablet Take 1 tablet (10 mg total) by mouth 3 (three) times daily as needed for anxiety. 07/21/22  Yes Rhetta Mura, MD  metoprolol succinate (TOPROL-XL) 100 MG 24 hr tablet Take 1 tablet (100 mg total) by mouth 2 (two) times daily with or immediately following a meal. 03/02/22  Yes Tobb, Kardie, DO  midodrine (PROAMATINE) 5 MG tablet Take 1 tablet (5 mg total) by mouth 2 (two) times daily. 08/19/22  Yes   pantoprazole (PROTONIX) 40 MG tablet Take 1 tablet (40 mg total) by mouth daily. 08/19/22  Yes Jerald Kief, MD  potassium chloride SA (KLOR-CON M) 20 MEQ tablet Take 1 tablet (20 mEq total) by mouth daily. 02/23/22  Yes   torsemide (DEMADEX) 20 MG tablet Take 1 tablet (20 mg total) by mouth 2 (two) times daily. 06/17/22  Yes Tobb, Kardie, DO  Zinc Sulfate 220 (50 Zn) MG TABS Take 1 tablet (220 mg total) by mouth daily for 30 days 06/10/22  Yes Leroy Sea, MD  mineral oil-hydrophilic petrolatum (AQUAPHOR) ointment Apply topically as needed for dry skin. Use with Unna boots please at the back of the wounds on the lower extremities when the Unna boots are changed Christine Jacobson not taking: Reported on 09/15/2022 07/21/22   Rhetta Mura, MD    Inpatient Medications: Scheduled Meds:  Continuous Infusions:  PRN Meds:   Allergies:    Allergies  Allergen Reactions   Pork-Derived Products Other (See  Comments)    Religious preference    Social History:   Social History   Socioeconomic History   Marital status: Single    Spouse name: Not on file   Number of children: 0   Years of education: Not on file   Highest education level: Not on file  Occupational History   Occupation: para Psychologist, forensic, part time cook  Tobacco Use   Smoking status: Former    Current packs/day: 1.00    Average packs/day: 1 pack/day for 4.0 years (4.0 ttl pk-yrs)    Types: Cigarettes   Smokeless tobacco: Never  Vaping Use   Vaping status: Never Used  Substance and Sexual Activity   Alcohol use: Not Currently    Alcohol/week: 1.0 standard drink of alcohol    Types: 1 Glasses of wine per week    Comment: "I do have my wine" every day, has cut down from 2 bottles at a time, currently 2-3 glasses   Drug use: No  Sexual activity: Not Currently  Other Topics Concern   Not on file  Social History Narrative   Lives by self   Social Determinants of Health   Financial Resource Strain: Low Risk  (12/03/2021)   Overall Financial Resource Strain (CARDIA)    Difficulty of Paying Living Expenses: Not very hard  Food Insecurity: No Food Insecurity (08/04/2022)   Hunger Vital Sign    Worried About Running Out of Food in the Last Year: Never true    Ran Out of Food in the Last Year: Never true  Recent Concern: Food Insecurity - Food Insecurity Present (06/02/2022)   Hunger Vital Sign    Worried About Running Out of Food in the Last Year: Sometimes true    Ran Out of Food in the Last Year: Sometimes true  Transportation Needs: No Transportation Needs (08/04/2022)   PRAPARE - Administrator, Civil Service (Medical): No    Lack of Transportation (Non-Medical): No  Physical Activity: Not on file  Stress: Not on file  Social Connections: Not on file  Intimate Partner Violence: Not At Risk (08/04/2022)   Humiliation, Afraid, Rape, and Kick questionnaire    Fear of Current or Ex-Partner: No     Emotionally Abused: No    Physically Abused: No    Sexually Abused: No    Family History:    Family History  Problem Relation Age of Onset   Heart disease Mother        endocarditis   Ovarian cancer Mother    Emphysema Father    Coronary artery disease Other        F in his 4   Diabetes Other        GP   Cancer Neg Hx      ROS:  Please see the history of present illness.   All other ROS reviewed and negative.     Physical Exam/Data:   Vitals:   09/15/22 1235 09/15/22 1244 09/15/22 1342 09/15/22 1423  BP: 118/77  91/68 110/62  Pulse: 83  88 82  Resp: 19  15 15   Temp: (!) 97.5 F (36.4 C)  97.7 F (36.5 C) 97.7 F (36.5 C)  TempSrc: Oral   Oral  SpO2: 100%  100% 100%  Weight:  83.9 kg    Height:  5\' 4"  (1.626 m)     No intake or output data in the 24 hours ending 09/15/22 1542    09/15/2022   12:44 PM 09/15/2022   11:05 AM 08/14/2022    7:00 AM  Last 3 Weights  Weight (lbs) 185 lb 185 lb 9.6 oz 196 lb 3.4 oz  Weight (kg) 83.915 kg 84.188 kg 89 kg     Body mass index is 31.76 kg/m.  General:  Well nourished, well developed, in no acute distress HEENT: normal Neck: no JVD Vascular: No carotid bruits; Distal pulses 2+ bilaterally Cardiac:  normal S1, S2; RRR; no murmur  Lungs:  clear to auscultation bilaterally, no wheezing, rhonchi or rales  Abd: soft, nontender, no hepatomegaly  Ext: 3+ edema, right lower extremity erythematous, itchy, no pain, weeping and in dressing. Musculoskeletal:  No deformities, BUE and BLE strength normal and equal Skin: warm and dry  Neuro:  CNs 2-12 intact, no focal abnormalities noted Psych:  Normal affect   EKG:  The EKG was personally reviewed and demonstrates: Rate controlled atrial fibrillation Telemetry:  Telemetry was personally reviewed and demonstrates: Atrial fibrillation, heart rate in the 80s.  Relevant CV  Studies:  Echo 08/16/2017 LV EF: 65% -   70%  Study Conclusions   - Left ventricle: The cavity size was  normal. Wall thickness was    increased in a pattern of severe LVH. Systolic function was    vigorous. The estimated ejection fraction was in the range of 65%    to 70%. Wall motion was normal; there were no regional wall    motion abnormalities. Features are consistent with a pseudonormal    left ventricular filling pattern, with concomitant abnormal    relaxation and increased filling pressure (grade 2 diastolic    dysfunction).  - Aortic valve: Mildly calcified annulus. Mildly thickened    leaflets.  - Mitral valve: There was mild regurgitation.  - Left atrium: The atrium was severely dilated.  - Right atrium: The atrium was mildly dilated.  - Pulmonary arteries: Systolic pressure was mildly increased. PA    peak pressure: 38 mm Hg (S).   Laboratory Data:  High Sensitivity Troponin:   Recent Labs  Lab 09/15/22 1307 09/15/22 1453  TROPONINIHS 13 14     Chemistry Recent Labs  Lab 09/15/22 1307  NA 135  K 4.6  CL 108  CO2 16*  GLUCOSE 117*  BUN 18  CREATININE 1.26*  CALCIUM 9.8  GFRNONAA 46*  ANIONGAP 11    No results for input(s): "PROT", "ALBUMIN", "AST", "ALT", "ALKPHOS", "BILITOT" in the last 168 hours. Lipids No results for input(s): "CHOL", "TRIG", "HDL", "LABVLDL", "LDLCALC", "CHOLHDL" in the last 168 hours.  Hematology Recent Labs  Lab 09/15/22 1307  WBC 9.9  RBC 3.85*  HGB 9.8*  HCT 33.1*  MCV 86.0  MCH 25.5*  MCHC 29.6*  RDW 18.1*  PLT 318   Thyroid No results for input(s): "TSH", "FREET4" in the last 168 hours.  BNP Recent Labs  Lab 09/15/22 1307  BNP 481.6*    DDimer No results for input(s): "DDIMER" in the last 168 hours.   Radiology/Studies:  DG Chest 2 View  Result Date: 09/15/2022 CLINICAL DATA:  Shortness of breath. EXAM: CHEST - 2 VIEW COMPARISON:  08/04/2022 FINDINGS: The cardio pericardial silhouette is enlarged. Interstitial markings are diffusely coarsened with chronic features. The lungs are clear without focal pneumonia,  edema, pneumothorax or pleural effusion. No acute bony abnormality. IMPRESSION: Chronic interstitial coarsening without acute cardiopulmonary findings. Electronically Signed   By: Kennith Center M.D.   On: 09/15/2022 14:11     Assessment and Plan:   Acute on chronic diastolic heart failure: She was recently admitted with anemia and underwent IV diuresis for heart failure in July.  BNP on arrival was 1600.  Her lung is clear on exam.  Chest x-ray showed no acute findings however does show interstitial coarsening.  She is not aware of prior diagnosis of pulmonary issue.  Oxygen saturation with balance between 82% to 100%.  I question if she has a undiagnosed pulmonary issue.  Her shortness of breath is likely multifactorial and is in part related to her diastolic heart failure and deconditioning.  Right lower extremity redness: Recently admitted with right lower extremity cellulitis and the treated with antibiotic.  Also underwent IV diuresis.  Right lower extremity is not painful, it is not particularly hot, white blood cell count is normal.  Unclear if this represents cellulitis versus leg swelling from heart failure  Noncompliance with medication: Dr. Servando Salina suspected untreated depression contributing to her noncompliance. Christine Jacobson says she has significant incontinence on diuretic. She is not sure what the midodrine does  and not sure if she is taking it or not.   Anemia: Recently underwent GI workup that showed gastritis, hemorrhoids and diverticulosis.  Hypertension: Although she carries a diagnosis of hypertension, however she actually requires midodrine as well.  She has not been compliant with midodrine.  She is on metoprolol succinate for rate control.  PAF: She has recurrent A-fib.  Last EKG that showed sinus rhythm was seen in January 2024.  Compliance with medications very questionable, may need cardioversion. Talking with the Christine Jacobson, she is adament she has been compliant with twice a day dosing  of Eliquis in the past 3 weeks   Risk Assessment/Risk Scores:        New York Heart Association (NYHA) Functional Class NYHA Class III  CHA2DS2-VASc Score = 4   This indicates a 4.8% annual risk of stroke. The Christine Jacobson's score is based upon: CHF History: 1 HTN History: 1 Diabetes History: 0 Stroke History: 0 Vascular Disease History: 0 Age Score: 1 Gender Score: 1         For questions or updates, please contact [facility] Please consult [email or web address] for contact info under    Ramond Dial, [place]  09/15/2022 3:42 PM

## 2022-09-15 NOTE — Progress Notes (Signed)
I have evaluated this patient will complete H&P and cosign resident's note once completed.

## 2022-09-15 NOTE — Progress Notes (Signed)
Cardiology Office Note:    Date:  09/15/2022   ID:  Unnamed Forsyth, DOB Oct 12, 1955, MRN [id number]  PCP:  Karenann Cai, NP  Cardiologist:  Thomasene Ripple, DO  Electrophysiologist:  None   Referring MD: Ceasar Lund, PA   " I am really short of breath"  History of Present Illness:    Christine Jacobson is a 67 y.o. female with a hx of  obesity, hypertrophic cardiomyopathy with intracavity gradient of 64 mmHg which increased with Valsalva, chronic diastolic heart failure grade 2 diastolic dysfunction, paroxysmal atrial fibrillation status post cardioversion, hypertension, hypercholesteremia is here today for follow-up visit.  Her last visit with me on Jun 17, 2022 she was short of breath and had not been taking her Lasix as prescribed.  During that visit and encouraged the patient to please be compliant with her Lasix as her blood pressure was also slightly elevated at visit.  Thankfully at that time she was in sinus rhythm.  Since that visit she admits that she had not restarted taking the Lasix.  Today she is significantly short of breath.  She is in a wheelchair she was brought in by her friend Christine Jacobson.  During our encounter she cannot speak in full sentences that she is significantly short of breath.    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   2D ECHO - EF >55%, normal   HTN (hypertension)    Hyperprolactinemia (HCC)    dx in her 18, took meds, self d/c a while back   Liver function test abnormality    normal when repeated   Mild hyperlipidemia    Obesity    Palpitations    negative stress echo in November 2012 with normal LV function; mild LVH, proximal septal thickening with narrow LVOT and mild gradient; mild MR and TR; Cardionet showed PACs in  November 2012   Pyoderma gangrenosa    Shortness of breath 07/11/2011   MET TEST    Past Surgical History:  Procedure Laterality Date   BIOPSY  08/10/2022   Procedure: BIOPSY;  Surgeon: Willis Modena, MD;  Location: [facility];  Service: Gastroenterology;;   CARDIOVERSION N/A 02/01/2022   Procedure: CARDIOVERSION;  Surgeon: Thomasene Ripple, DO;  Location: [facility];  Service: Cardiovascular;  Laterality: N/A;   COLONOSCOPY WITH PROPOFOL Left 08/10/2022   Procedure: COLONOSCOPY WITH PROPOFOL;  Surgeon: Willis Modena, MD;  Location: [facility];  Service: Gastroenterology;  Laterality: Left;   ESOPHAGOGASTRODUODENOSCOPY (EGD) WITH PROPOFOL Left 08/10/2022   Procedure: ESOPHAGOGASTRODUODENOSCOPY (EGD) WITH PROPOFOL;  Surgeon: Willis Modena, MD;  Location: [facility];  Service: Gastroenterology;  Laterality: Left;   POLYPECTOMY  08/10/2022   Procedure: POLYPECTOMY;  Surgeon: Willis Modena, MD;  Location: [facility];  Service: Gastroenterology;;   RIGHT HEART CATH N/A 10/15/2021   Procedure: RIGHT HEART CATH;  Surgeon: Elder Negus, MD;  [facility];  Service: Cardiovascular;  Laterality: N/A;   SKIN GRAFT  2006   porcine, R leg    Current Medications: Current Meds  Medication Sig   apixaban (ELIQUIS) 5 MG TABS tablet Take 1 tablet (5 mg total) by mouth 2 (two) times daily.   ascorbic acid (VITAMIN C) 500 MG tablet Take 1 tablet (500 mg total) by mouth daily for 30 days  atorvastatin (LIPITOR) 40 MG tablet Take 1 tablet (40 mg total) by mouth daily.   metoprolol succinate (TOPROL-XL) 100 MG 24 hr tablet Take 1 tablet (100 mg total) by mouth 2 (two) times daily with or immediately following a meal.   midodrine (PROAMATINE) 5 MG tablet Take 1 tablet (5 mg total) by mouth 2 (two) times daily with a meal.   pantoprazole (PROTONIX) 40 MG tablet Take 1 tablet (40 mg total) by mouth daily.   potassium chloride SA (KLOR-CON M) 20 MEQ tablet Take 1 tablet (20 mEq  total) by mouth daily.   torsemide (DEMADEX) 20 MG tablet Take 1 tablet (20 mg total) by mouth 2 (two) times daily.   Zinc Sulfate 220 (50 Zn) MG TABS Take 1 tablet (220 mg total) by mouth daily for 30 days     Allergies:   Ativan [lorazepam], Pork-derived products, and Tylenol [acetaminophen]   Social History   Socioeconomic History   Marital status: Single    Spouse name: Not on file   Number of children: 0   Years of education: Not on file   Highest education level: Not on file  Occupational History   Occupation: para Psychologist, forensic, part time cook  Tobacco Use   Smoking status: Former    Current packs/day: 1.00    Average packs/day: 1 pack/day for 4.0 years (4.0 ttl pk-yrs)    Types: Cigarettes   Smokeless tobacco: Never  Vaping Use   Vaping status: Never Used  Substance and Sexual Activity   Alcohol use: Not Currently    Alcohol/week: 1.0 standard drink of alcohol    Types: 1 Glasses of wine per week    Comment: "I do have my wine" every day, has cut down from 2 bottles at a time, currently 2-3 glasses   Drug use: No   Sexual activity: Not Currently  Other Topics Concern   Not on file  Social History Narrative   Lives by self   Social Determinants of Health   Financial Resource Strain: Low Risk  (12/03/2021)   Overall Financial Resource Strain (CARDIA)    Difficulty of Paying Living Expenses: Not very hard  Food Insecurity: No Food Insecurity (08/04/2022)   Hunger Vital Sign    Worried About Running Out of Food in the Last Year: Never true    Ran Out of Food in the Last Year: Never true  Recent Concern: Food Insecurity - Food Insecurity Present (06/02/2022)   Hunger Vital Sign    Worried About Running Out of Food in the Last Year: Sometimes true    Ran Out of Food in the Last Year: Sometimes true  Transportation Needs: No Transportation Needs (08/04/2022)   PRAPARE - Administrator, Civil Service (Medical): No    Lack of Transportation (Non-Medical):  No  Physical Activity: Not on file  Stress: Not on file  Social Connections: Not on file     Family History: The patient's family history includes Coronary artery disease in an other family member; Diabetes in an other family member; Emphysema in her father; Heart disease in her mother; Ovarian cancer in her mother. There is no history of Cancer.  ROS:   Review of Systems  Constitution: Negative for decreased appetite, fever and weight gain.  HENT: Negative for congestion, ear discharge, hoarse voice and sore throat.   Eyes: Negative for discharge, redness, vision loss in right eye and visual halos.  Cardiovascular: Negative for chest pain, dyspnea on exertion, leg swelling, orthopnea  and palpitations.  Respiratory: Negative for cough, hemoptysis, shortness of breath and snoring.   Endocrine: Negative for heat intolerance and polyphagia.  Hematologic/Lymphatic: Negative for bleeding problem. Does not bruise/bleed easily.  Skin: Negative for flushing, nail changes, rash and suspicious lesions.  Musculoskeletal: Negative for arthritis, joint pain, muscle cramps, myalgias, neck pain and stiffness.  Gastrointestinal: Negative for abdominal pain, bowel incontinence, diarrhea and excessive appetite.  Genitourinary: Negative for decreased libido, genital sores and incomplete emptying.  Neurological: Negative for brief paralysis, focal weakness, headaches and loss of balance.  Psychiatric/Behavioral: Negative for altered mental status, depression and suicidal ideas.  Allergic/Immunologic: Negative for HIV exposure and persistent infections.    EKGs/Labs/Other Studies Reviewed:    The following studies were reviewed today:   EKG: Atrial fibrillation with controlled ventricular rate  Recent Labs: 08/04/2022: B Natriuretic Peptide 1,630.3 08/13/2022: Magnesium 1.8 08/14/2022: ALT 21; BUN 14; Creatinine, Ser 1.15; Hemoglobin 8.0; Platelets 235; Potassium 3.9; Sodium 139  Recent Lipid Panel     Component Value Date/Time   CHOL 228 (H) 06/03/2022 0028   TRIG 172 (H) 06/03/2022 0028   HDL 48 06/03/2022 0028   CHOLHDL 4.8 06/03/2022 0028   VLDL 34 06/03/2022 0028   LDLCALC 146 (H) 06/03/2022 0028   LDLDIRECT 121.8 05/26/2011 0928    Physical Exam:    VS:  BP (!) 86/58 (BP Location: Left Arm, Patient Position: Sitting, Cuff Size: Large)   Pulse 85   Ht 5\' 4"  (1.626 m)   Wt 185 lb 9.6 oz (84.2 kg)   SpO2 98%   BMI 31.86 kg/m     Wt Readings from Last 3 Encounters:  09/15/22 185 lb 9.6 oz (84.2 kg)  08/14/22 196 lb 3.4 oz (89 kg)  07/17/22 205 lb 4 oz (93.1 kg)     GEN: Significantly fluid overloaded/anasarcic HEENT: Normal NECK: No JVD; No carotid bruits LYMPHATICS: No lymphadenopathy CARDIAC: S1S2 noted,RRR, no murmurs, rubs, gallops RESPIRATORY:  Clear to auscultation without rales, wheezing or rhonchi  ABDOMEN: Soft, non-tender, non-distended, +bowel sounds, no guarding. EXTREMITIES: Significant bilateral edema, No cyanosis, no clubbing MUSCULOSKELETAL:  No deformity  SKIN: Warm and dry NEUROLOGIC:  Alert and oriented x 3, non-focal PSYCHIATRIC:  Normal affect, good insight  ASSESSMENT:    1. Acute on chronic diastolic heart failure (HCC)   2. Shortness of breath   3. Primary hypertension   4. PAF (paroxysmal atrial fibrillation) (HCC)     PLAN:     Unfortunately the patient had not been taking her Lasix and his significantly volume overloaded compared to her last visit.  She also is short of breath.  At this time it is beneficial for the patient to be seen in the ED for evaluation and likely admission for acute exacerbation of heart failure.  She is back in atrial fibrillation she is rate controlled, she may also need cardioversion during this time.  Unclear if she has been taking her Eliquis so if she is going to undergo cardioversion it would be beneficial for TEE/cardioversion.  She is hypotensive, not taking her midodrine.  Overall in addition to  her acute clinical heart failure exacerbation, I think the only reason why the patient is not taking her medication is her untreated depression.  She will be taken to the ED by her friend Christine Jacobson.   Medication Adjustments/Labs and Tests Ordered: Current medicines are reviewed at length with the patient today.  Concerns regarding medicines are outlined above.  Orders Placed This Encounter  Procedures  EKG 12-Lead   No orders of the defined types were placed in this encounter.   Patient Instructions      Adopting a Healthy Lifestyle.  Know what a healthy weight is for you (roughly BMI <25) and aim to maintain this   Aim for 7+ servings of fruits and vegetables daily   65-80+ fluid ounces of water or unsweet tea for healthy kidneys   Limit to max 1 drink of alcohol per day; avoid smoking/tobacco   Limit animal fats in diet for cholesterol and heart health - choose grass fed whenever available   Avoid highly processed foods, and foods high in saturated/trans fats   Aim for low stress - take time to unwind and care for your mental health   Aim for 150 min of moderate intensity exercise weekly for heart health, and weights twice weekly for bone health   Aim for 7-9 hours of sleep daily   When it comes to diets, agreement about the perfect plan isnt easy to find, even among the experts. Experts at the [facility] of [employer] developed an idea known as the Healthy Eating Plate. Just imagine a plate divided into logical, healthy portions.   The emphasis is on diet quality:   Load up on vegetables and fruits - one-half of your plate: Aim for color and variety, and remember that potatoes dont count.   Go for whole grains - one-quarter of your plate: Whole wheat, barley, wheat berries, quinoa, oats, brown rice, and foods made with them. If you want pasta, go with whole wheat pasta.   Protein power - one-quarter of your plate: Fish, chicken, beans, and nuts are all healthy,  versatile protein sources. Limit red meat.   The diet, however, does go beyond the plate, offering a few other suggestions.   Use healthy plant oils, such as olive, canola, soy, corn, sunflower and peanut. Check the labels, and avoid partially hydrogenated oil, which have unhealthy trans fats.   If youre thirsty, drink water. Coffee and tea are good in moderation, but skip sugary drinks and limit milk and dairy products to one or two daily servings.   The type of carbohydrate in the diet is more important than the amount. Some sources of carbohydrates, such as vegetables, fruits, whole grains, and beans-are healthier than others.   Finally, stay active  Osvaldo Shipper, DO  09/15/2022 12:08 PM     [facility]

## 2022-09-15 NOTE — ED Notes (Signed)
ED TO INPATIENT HANDOFF REPORT  ED Nurse Name and Phone #: Einar Grad [phone]  S Name/Age/Gender Christine Jacobson 67 y.o. female Room/Bed: 009C/009C  Code Status   Code Status: Limited: Do not attempt resuscitation (DNR) -DNR-LIMITED -Do Not Intubate/DNI   Home/SNF/Other Home Patient oriented to: self, place, time, and situation Is this baseline? Yes   Triage Complete: Triage complete  Chief Complaint Acute exacerbation of CHF (congestive heart failure) (HCC) [I50.9]  Triage Note Reports starting have palpitations, sob and leg swelling. Hx of afib and chf. Called her MD and told to come here    Allergies Allergies  Allergen Reactions   Pork-Derived Products Other (See Comments)    Religious preference    Level of Care/Admitting Diagnosis ED Disposition     ED Disposition  Admit   Condition  --   Comment  Hospital Area: [facility] [[id number]]  Level of Care: Progressive [102]  Admit to Progressive based on following criteria: CARDIOVASCULAR & THORACIC of moderate stability with acute coronary syndrome symptoms/low risk myocardial infarction/hypertensive urgency/arrhythmias/heart failure potentially compromising stability and stable post cardiovascular intervention patients.  May place patient in observation at [facility] or [facility] if equivalent level of care is available:: No  Covid Evaluation: Asymptomatic - no recent exposure (last 10 days) testing not required  Diagnosis: Acute exacerbation of CHF (congestive heart failure) [facility] [[id number]]  Admitting Physician: Para March [[id number]]  Attending Physician: Doreene Eland [[id number]]          B Medical/Surgery History 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   2D ECHO  - EF >55%, normal   HTN (hypertension)    Hyperprolactinemia (HCC)    dx in her 18, took meds, self d/c a while back   Liver function test abnormality    normal when repeated   Mild hyperlipidemia    Obesity    Palpitations    negative stress echo in November 2012 with normal LV function; mild LVH, proximal septal thickening with narrow LVOT and mild gradient; mild MR and TR; Cardionet showed PACs in November 2012   Pyoderma gangrenosa    Shortness of breath 07/11/2011   MET TEST   Past Surgical History:  Procedure Laterality Date   BIOPSY  08/10/2022   Procedure: BIOPSY;  Surgeon: Willis Modena, MD;  Location: [facility];  Service: Gastroenterology;;   CARDIOVERSION N/A 02/01/2022   Procedure: CARDIOVERSION;  Surgeon: Thomasene Ripple, DO;  Location: [facility];  Service: Cardiovascular;  Laterality: N/A;   COLONOSCOPY WITH PROPOFOL Left 08/10/2022   Procedure: COLONOSCOPY WITH PROPOFOL;  Surgeon: Willis Modena, MD;  Location: [facility];  Service: Gastroenterology;  Laterality: Left;   ESOPHAGOGASTRODUODENOSCOPY (EGD) WITH PROPOFOL Left 08/10/2022   Procedure: ESOPHAGOGASTRODUODENOSCOPY (EGD) WITH PROPOFOL;  Surgeon: Willis Modena, MD;  Location: [facility];  Service: Gastroenterology;  Laterality: Left;   POLYPECTOMY  08/10/2022   Procedure: POLYPECTOMY;  Surgeon: Willis Modena, MD;  Location: [facility];  Service: Gastroenterology;;   RIGHT HEART CATH N/A 10/15/2021   Procedure: RIGHT HEART CATH;  Surgeon: Elder Negus, MD;  Location: [facility];  Service: Cardiovascular;  Laterality: N/A;   SKIN GRAFT  2006   porcine, R leg     A IV Location/Drains/Wounds Patient Lines/Drains/Airways Status     Active Line/Drains/Airways  Name Placement date Placement time Site Days   Peripheral IV 09/15/22 20 G Right Antecubital 09/15/22  1451  Antecubital  less than 1   Wound / Incision (Open or Dehisced) 08/05/22 Non-pressure wound Tibial Posterior;Right  venous ulcers x 2; 7 cm x 3 cm and 3 cm x 3 cm 08/05/22  0809  Tibial  41            Intake/Output Last 24 hours No intake or output data in the 24 hours ending 09/15/22 1913  Labs/Imaging Results for orders placed or performed during the hospital encounter of 09/15/22 (from the past 48 hour(s))  Basic metabolic panel     Status: Abnormal   Collection Time: 09/15/22  1:07 PM  Result Value Ref Range   Sodium 135 135 - 145 mmol/L   Potassium 4.6 3.5 - 5.1 mmol/L   Chloride 108 98 - 111 mmol/L   CO2 16 (L) 22 - 32 mmol/L   Glucose, Bld 117 (H) 70 - 99 mg/dL    Comment: Glucose reference range applies only to samples taken after fasting for at least 8 hours.   BUN 18 8 - 23 mg/dL   Creatinine, Ser 7.82 (H) 0.44 - 1.00 mg/dL   Calcium 9.8 8.9 - 95.6 mg/dL   GFR, Estimated 46 (L) >60 mL/min    Comment: (NOTE) Calculated using the CKD-EPI Creatinine Equation (2021)    Anion gap 11 5 - 15    Comment: Performed at [facility], [address] [place], [place] [address]  CBC     Status: Abnormal   Collection Time: 09/15/22  1:07 PM  Result Value Ref Range   WBC 9.9 4.0 - 10.5 K/uL   RBC 3.85 (L) 3.87 - 5.11 MIL/uL   Hemoglobin 9.8 (L) 12.0 - 15.0 g/dL   HCT 65.7 (L) 84.6 - 96.2 %   MCV 86.0 80.0 - 100.0 fL   MCH 25.5 (L) 26.0 - 34.0 pg   MCHC 29.6 (L) 30.0 - 36.0 g/dL   RDW 95.2 (H) 84.1 - 32.4 %   Platelets 318 150 - 400 K/uL   nRBC 0.0 0.0 - 0.2 %    Comment: Performed at [facility], [address] [place], [place] [address]  Troponin I (High Sensitivity)     Status: None   Collection Time: 09/15/22  1:07 PM  Result Value Ref Range   Troponin I (High Sensitivity) 13 <18 ng/L    Comment: (NOTE) Elevated high sensitivity troponin I (hsTnI) values and significant  changes across serial measurements may suggest ACS but many other  chronic and acute conditions are known to elevate hsTnI results.  Refer to the "Links" section for chest pain algorithms and  additional  guidance. Performed at [facility], [address], [place], [place] [address]   Brain natriuretic peptide     Status: Abnormal   Collection Time: 09/15/22  1:07 PM  Result Value Ref Range   B Natriuretic Peptide 481.6 (H) 0.0 - 100.0 pg/mL    Comment: Performed at [facility], [address] [place], [place] [address]  Troponin I (High Sensitivity)     Status: None   Collection Time: 09/15/22  2:53 PM  Result Value Ref Range   Troponin I (High Sensitivity) 14 <18 ng/L    Comment: (NOTE) Elevated high sensitivity troponin I (hsTnI) values and significant  changes across serial measurements may suggest ACS but many other  chronic and acute conditions  are known to elevate hsTnI results.  Refer to the "Links" section for chest pain algorithms and additional  guidance. Performed at [facility], [address] [place], [place] [address]    DG Chest 2 View  Result Date: 09/15/2022 CLINICAL DATA:  Shortness of breath. EXAM: CHEST - 2 VIEW COMPARISON:  08/04/2022 FINDINGS: The cardio pericardial silhouette is enlarged. Interstitial markings are diffusely coarsened with chronic features. The lungs are clear without focal pneumonia, edema, pneumothorax or pleural effusion. No acute bony abnormality. IMPRESSION: Chronic interstitial coarsening without acute cardiopulmonary findings. Electronically Signed   By: Kennith Center M.D.   On: 09/15/2022 14:11    Pending Labs Unresulted Labs (From admission, onward)     Start     Ordered   09/16/22 0500  Basic metabolic panel  Tomorrow morning,   R        09/15/22 1704   09/16/22 0500  CBC  Tomorrow morning,   R        09/15/22 1704   09/16/22 0500  Magnesium  Tomorrow morning,   R        09/15/22 1704   09/16/22 0500  TSH  Tomorrow morning,   R        09/15/22 1848            Vitals/Pain Today's Vitals   09/15/22 1820 09/15/22 1820 09/15/22 1826 09/15/22 1900  BP:   (!) 85/60 103/88  Pulse:  86 88 82   Resp:  18 17 20   Temp:  97.6 F (36.4 C)    TempSrc:  Oral    SpO2:  99%  100%  Weight:      Height:      PainSc: 4        Isolation Precautions No active isolations  Medications Medications  atorvastatin (LIPITOR) tablet 40 mg (40 mg Oral Given 09/15/22 1830)  pantoprazole (PROTONIX) EC tablet 40 mg (40 mg Oral Given 09/15/22 1830)  hydrOXYzine (ATARAX) tablet 10 mg (has no administration in time range)  midodrine (PROAMATINE) tablet 5 mg (5 mg Oral Given 09/15/22 1840)  apixaban (ELIQUIS) tablet 5 mg (has no administration in time range)  furosemide (LASIX) injection 40 mg (40 mg Intravenous Given 09/15/22 1502)  acetaminophen (TYLENOL) tablet 650 mg (650 mg Oral Given 09/15/22 1605)    Mobility walks with device     Focused Assessments Cardiac Assessment Handoff:  Cardiac Rhythm: Atrial fibrillation Lab Results  Component Value Date   CKTOTAL 110 05/26/2011   CKMB 4.0 05/26/2011   Lab Results  Component Value Date   DDIMER 0.42 08/04/2022   Does the Patient currently have chest pain? No    R Recommendations: See Admitting Provider Note  Report given to:   Additional Notes: Patient admitted for CHF, afib, and bilateral leg swelling; patient NPO at midnight; AAOx4; was hypotensive (see VS flowsheets) with improvement after midodrine

## 2022-09-15 NOTE — H&P (Addendum)
67 Y/O F with PMhx of HOCM, PAF, Diastolic CHF, HTN, and obesity was sent to the ED from her cardiologist's office for fluid overload. She is a bit upset at this time, given that so many providers are asking the same questions repeatedly. She endorsed feeling well from a respiratory standpoint with improved breathing. She endorsed compliance with all her home meds and was upset about the report that she had not been compliant with her meds.  Gen: No distress HEENT: EOMI, PERRLA Neuro: grossly intact  Heart: Regular rhythm and rate, grade 2 systolic murmur heard Lungs: CTA B/L Abd: Soft Ext: +++ weeping edema of her LL B/L with erythema of her LL B/L from her ankle to a few inches below her knees. RLL wrapped with clean gauze dressing did not open at this time.  A/P:  Acute on chronic diastolic CHF exacerbation: No O2 requirement BNP 481, which is lower than her baseline of 1600 last month Her chest xray showed chronic interstitial coarsening without acute cardiopulmonary findings ECHO from last year August reviewed Repeat ECHO Recent labs, FLP, A1C reviewed - check TSH  Lasix 40 mg IV Lasix in the ED Will likely need to be re-dosed Holding home  beta-blocker due to soft BP  Monitor BP closely Monitor her I/Os closely Daily weighing Cardiology is co-managing  PAF: Was in sinus at the time of my exam Resume home anticoagulant Rate controlled on home Metoprolol XL 100 mg every day Holding Metoprolol due to low BP May resume as BP permits or at a lower dose Cardiology to follow  B/L LL edema  with erythema: RLL was partially wrapped at the time of this exam Epic media image reviewed Likely venous stasis with no signs of infection I will reassess her again  for improvement after diuresis Low suspicion for DVT given B/L swelling and chronicity Consider duplex U/S if developing pain Monitor with leg elevation WOC for right LL wound care  Chronic normocytic anemia: Likely anemia  of chronic disease No acute bleed No personal hx of CAD - transfusion threshold of < 7 unless otherwise stated by Cards Monitor closely for now  Acute renal impairment on CKD IIIa: Baseline GFR of 51 - 52 Monitor Kidney function while diuresing her

## 2022-09-15 NOTE — Assessment & Plan Note (Signed)
Chronic BLE swelling with venous stasis changes. RLE was wrapped in a dressing on exam - low suspicion for cellulitis, no significant warmth, drainage, afebrile - low suspicion for dvt, pt reliably takes eliquis, no asymmetric leg swelling or calf tenderness - wound care consulted

## 2022-09-15 NOTE — Assessment & Plan Note (Addendum)
Suspect this could be the cause of her dyspnea.  Concern for volume overload given missed Lasix doses and elevated BNP.  Could also be due to inadequate control of afib. Patient is rate controlled, but per cardiology could benefit from rhythm control as well. CXR without pleural effusions, but does have cardiomegaly. Does not appear profusely volume overloaded on exam and BNP elevated though not her max. Additionally has bilateral lower extremity edema. -Given Lasix 40 mg in the ED -Cardiology following, appreciate recs -Strict I&Os -Daily weights -TSH

## 2022-09-15 NOTE — ED Provider Triage Note (Signed)
Emergency Medicine Provider Triage Evaluation Note  Christine Jacobson , a 67 y.o. female  was evaluated in triage.  Pt complains of momentary near syncopal event around noon.  Patient reports that he was walking out of [employer] and was talking to his stepdad and had a moment where he felt he could not talk or swallow.  This lasted seconds, less than 5.  He reports that he felt weak in his knees as well.  No syncope.  Denies any chest pain or shortness of breath at this time.  He reports he is been dealing with headaches off and on for the past year that feels it is going up with his blood pressure.  Last time his blood pressure medication was changed was over a year ago.  It seems to have been causing him some lightheadedness as well then.  He denies any visual changes or any hearing problems.  He reports he has been having these episodes off and on for the past few months.  Review of Systems  Positive:  Negative:   Physical Exam  BP 118/77 (BP Location: Right Arm)   Pulse 83   Temp (!) 97.5 F (36.4 C) (Oral)   Resp 19   Ht 5\' 4"  (1.626 m)   Wt 83.9 kg   SpO2 100%   BMI 31.76 kg/m  Gen:   Awake, no distress   Resp:  Normal effort  MSK:   Moves extremities without difficulty  Other:  Cranial nerves grossly intact.  Strength grossly intact as well.  No facial droop noted.  Answering questions appropriately appropriate speech.  Medical Decision Making  Medically screening exam initiated at 1:37 PM.  Appropriate orders placed.  Christine Jacobson was informed that the remainder of the evaluation will be completed by another provider, this initial triage assessment does not replace that evaluation, and the importance of remaining in the ED until their evaluation is complete.  Symptoms have resolved.  Will order troponin addition of the labs already ordered in triage for near syncopal workup.  He currently is asymptomatic.   Christine Rich, PA-C 09/15/22 1352

## 2022-09-15 NOTE — H&P (Addendum)
Hospital Admission History and Physical Service Pager: [phone]  Patient name: Christine Jacobson Medical record number: [id number] Date of Birth: 1955/04/10 Age: 67 y.o. Gender: female  Primary Care Provider: Karenann Cai, NP Consultants: Cardiology  Code Status: DNR as confirmed by patient  Preferred Emergency Contact:  Contact Information     Name Relation Home Work Mobile   [place] [phone]        Other Contacts   None on File     Chief Complaint: Dyspnea  Assessment and Plan: Christine Jacobson is a 67 y.o. female presenting with worsening dyspnea since yesterday.  Patient was sent by Dr. Carlena Sax, her A-fib clinic physician, for concern about volume overload in the setting of missing Lasix doses. Differential for this patient's presentation of this includes acute on chronic CHF exacerbation, A-fib with RVR, pneumonia, pneumothorax, PE.  Patient has been rate controlled since arrival to the ED.  Low suspicion for pneumonia as patient is afebrile, no cough, lung sounds clear bilaterally.  chest x-ray with chronic interstitial coarsening no acute findings, no pneumothorax.  Low suspicion for PE as patient takes Eliquis regularly.  Suspect her dyspnea could be related to acute on chronic CHF exacerbation.  BNP slightly elevated at 481 on admission.  Cardiology requested admission and does plan on DCCV tomorrow, 8/30. Assessment & Plan Acute on chronic congestive heart failure, unspecified heart failure type (HCC) Suspect this could be the cause of her dyspnea.  Concern for volume overload given missed Lasix doses and elevated BNP.  Could also be due to inadequate control of afib. Patient is rate controlled, but per cardiology could benefit from rhythm control as well. CXR without pleural effusions, but does have cardiomegaly. Does not appear profusely volume overloaded on exam and BNP elevated though not her max. Additionally has bilateral lower extremity edema. -Given  Lasix 40 mg in the ED -Cardiology following, appreciate recs -Strict I&Os -Daily weights -TSH Atrial fibrillation by electrocardiogram [facility]) Patient is rate controlled, on eliquis at home.  - Cardiology plans for DCCV tomorrow - NPO after midnight - Patient has been rate controlled since arrival to the ED - Continued home Eliquis Leg swelling Chronic BLE swelling with venous stasis changes. RLE was wrapped in a dressing on exam - low suspicion for cellulitis, no significant warmth, drainage, afebrile - low suspicion for dvt, pt reliably takes eliquis, no asymmetric leg swelling or calf tenderness - wound care consulted  Chronic and Stable Conditions: HTN - continue metoprolol   FEN/GI: NPO  VTE Prophylaxis: Eliquis  Disposition: Pending clinical improvement and evaluation by PT and OT  History of Present Illness:  Christine Jacobson is a 67 y.o. female presenting with dyspnea  Patient went to Afib clinic to see Dr. Servando Salina today. Patient said that she was dyspneic since yesterday. She says this is not as bad as last time she was admitted. They were worried about patient being volume overloaded. Patient said that she had not been taking lasix because she cannot get to the bathroom to urinate. Patient says that she has a walker but it is a distance to get to the bathroom. Says maybe she could get depends after this admission. Says she has urgency but no incontinence.   Says she gets very anxious. She has not been taking "anxiety pill" because she felt like it does not work. She does say she feels chest pressure when she is walking but when she sits it goes away. She also says that she feels  like her heart is racing at this time.   She used to have a home health nurse that would help with wound care. She said they have not been coming for a while.   She gets to the Afib clinic as her friends [name] or Link Snuffer take her and she gets transport from [place] [address].   She denies n/v/d, cough,  wheezing. Has not fallen. Her legs have been swollen and itching but not necessarily more than normal for her.   She has been taking her eliquis twice a day every day. She is not sure if she has been taking her midodrine.   In the ED, patient was hemodynamically stable. She was in afib but rate controlled.   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   Pertinent Past Surgical History: DCCV  Heart cath    Remainder reviewed in history tab.  Pertinent Social History: Tobacco use: Has not smoked since college  Alcohol use: No, used to drink a glass of wine every now and then  Other Substance use: none Lives by herself   Pertinent Family History: Mother - heart disease, ovarian cancer Father - emphysema  Remainder reviewed in history tab.   Important Outpatient Medications: Eliquis 5mg  BID Metoprolol  Midodrine  Protonix   Torsemide  Atorvastatin  Remainder reviewed in medication history.   Objective: BP 103/88   Pulse 82   Temp 97.6 F (36.4 C) (Oral)   Resp 20   Ht 5\' 4"  (1.626 m)   Wt 83.9 kg   SpO2 100%   BMI 31.76 kg/m  Exam: General: Well appearing. No acute distress Eyes: EOMI Neck: No JVD Cardiovascular: Regular rate, irregularly irregular rhythm.  No murmurs, appropriately warm extremities, palpable radial and dorsal pedal pulses  Respiratory: Clear to auscultation bilaterally, no coarse breath sounds noted.  No increased work of breathing on room air Gastrointestinal: Normal heart sounds, soft, nontender MSK: Chronic skin changes to bilateral lower extremities.  Dressing over right lower extremity.  Did not remove dressing at this time.  No significant warmth to bilateral lower extremities.  No sign of infection to bilateral lower extremities.  No calf tenderness bilateral lower extremities.  No calf asymmetry. Neuro: A&O x4 Psych: Anxious  Labs:   CBC BMET  Recent Labs  Lab 09/15/22 1307  WBC 9.9  HGB 9.8*  HCT 33.1*  PLT 318   Recent Labs  Lab 09/15/22 1307  NA 135  K 4.6  CL 108  CO2 16*  BUN 18  CREATININE 1.26*  GLUCOSE 117*  CALCIUM 9.8    Pertinent additional labs BNP 481.6, troponin 14, 13, Cr 1.26.  EKG: Atrial fibrillation rate of 86. Qtc . No notable ST elevation   Imaging Studies Performed: Chest Xray IMPRESSION: Chronic interstitial coarsening without acute cardiopulmonary findings.  I independently reviewed and agree with radiologist's impression of imaging study. No blunting of costophrenic angles, cardiomegaly   Everhart, Kirstie, DO 09/15/2022, 7:30 PM PGY-1, [facility]  FPTS Intern pager: [phone], text pages welcome Secure chat group [facility] Teaching Service   Upper Level Attestation I have seen and examined the patient with the resident Dr. Rexene Alberts. I agree with the history, physical, and assessment above with any necessary edits.   Lockie Mola, MD  Family Medicine Teaching Service

## 2022-09-15 NOTE — ED Provider Notes (Signed)
[place] EMERGENCY DEPARTMENT AT [facility] Provider Note   CSN: [id number] Arrival date & time: 09/15/22  1219     History  Chief Complaint  Patient presents with  . Atrial Fibrillation  . Shortness of Breath    Christine Jacobson is a 67 y.o. female.  Patient is a 67 year old female with a past medical history of A-fib on Eliquis, CHF senting to the emergency department from the cardiology clinic with shortness of breath.  The patient states that she has had increasing shortness of breath for the last few days which prompted her to see her cardiologist.  She states that she has not noticed any heart palpitations.  She denies any chest pain.  She denies any fever or cough.  She states that she has chronic swelling in her bilateral legs that does not seem worse than usual to her.  She states that she does have a wound to her right leg that she is treating with daily dressing changes not currently on any antibiotics or cellulitis.  Her cardiologist was concerned as she was found to be in A-fib and appeared volume overloaded and recommended that she come to the ER for further management.  The history is provided by the patient and medical records.  Atrial Fibrillation Associated symptoms include shortness of breath.  Shortness of Breath      Home Medications Prior to Admission medications   Medication Sig Start Date End Date Taking? Authorizing Provider  apixaban (ELIQUIS) 5 MG TABS tablet Take 1 tablet (5 mg total) by mouth 2 (two) times daily. 02/23/22   Tobb, Kardie, DO  ascorbic acid (VITAMIN C) 500 MG tablet Take 1 tablet (500 mg total) by mouth daily for 30 days 06/10/22   Leroy Sea, MD  atorvastatin (LIPITOR) 40 MG tablet Take 1 tablet (40 mg total) by mouth daily. 07/01/22     hydrOXYzine (ATARAX) 10 MG tablet Take 1 tablet (10 mg total) by mouth 3 (three) times daily as needed for anxiety. Patient not taking: Reported on 09/15/2022 07/21/22   Rhetta Mura,  MD  metoprolol succinate (TOPROL-XL) 100 MG 24 hr tablet Take 1 tablet (100 mg total) by mouth 2 (two) times daily with or immediately following a meal. 03/02/22   Tobb, Kardie, DO  midodrine (PROAMATINE) 5 MG tablet Take 1 tablet (5 mg total) by mouth 2 (two) times daily with a meal. 08/14/22   Jerald Kief, MD  midodrine (PROAMATINE) 5 MG tablet Take 1 tablet (5 mg total) by mouth 2 (two) times daily. 08/19/22     mineral oil-hydrophilic petrolatum (AQUAPHOR) ointment Apply topically as needed for dry skin. Use with Unna boots please at the back of the wounds on the lower extremities when the Unna boots are changed Patient not taking: Reported on 09/15/2022 07/21/22   Rhetta Mura, MD  pantoprazole (PROTONIX) 40 MG tablet Take 1 tablet (40 mg total) by mouth daily. 08/19/22   Jerald Kief, MD  potassium chloride SA (KLOR-CON M) 20 MEQ tablet Take 2 tablets (40 mEq total) by mouth 2 (two) times daily. Patient not taking: Reported on 09/15/2022 07/21/22   Rhetta Mura, MD  potassium chloride SA (KLOR-CON M) 20 MEQ tablet Take 1 tablet (20 mEq total) by mouth daily. 02/23/22     torsemide (DEMADEX) 20 MG tablet Take 1 tablet (20 mg total) by mouth 2 (two) times daily. 08/14/22 09/13/22  Jerald Kief, MD  torsemide (DEMADEX) 20 MG tablet Take 1 tablet (20 mg  total) by mouth 2 (two) times daily. 06/17/22   Tobb, Kardie, DO  Zinc Sulfate 220 (50 Zn) MG TABS Take 1 tablet (220 mg total) by mouth daily for 30 days 06/10/22   Leroy Sea, MD      Allergies    Ativan [lorazepam], Pork-derived products, and Tylenol [acetaminophen]    Review of Systems   Review of Systems  Respiratory:  Positive for shortness of breath.     Physical Exam Updated Vital Signs BP 110/62 (BP Location: Right Arm)   Pulse 82   Temp 97.7 F (36.5 C) (Oral)   Resp 15   Ht 5\' 4"  (1.626 m)   Wt 83.9 kg   SpO2 100%   BMI 31.76 kg/m  Physical Exam Vitals and nursing note reviewed.  Constitutional:       General: She is not in acute distress.    Appearance: She is well-developed.  HENT:     Head: Normocephalic.     Mouth/Throat:     Mouth: Mucous membranes are moist.  Eyes:     Extraocular Movements: Extraocular movements intact.  Cardiovascular:     Rate and Rhythm: Normal rate. Rhythm irregular.     Heart sounds: Normal heart sounds.  Pulmonary:     Effort: Pulmonary effort is normal.     Breath sounds: Normal breath sounds.  Abdominal:     Palpations: Abdomen is soft.  Musculoskeletal:        General: Normal range of motion.     Cervical back: Normal range of motion and neck supple.     Right lower leg: Edema (2+ to knee) present.     Left lower leg: Edema (2+ to knee) present.  Skin:    General: Skin is warm.     Comments: Chronic skin changes to bilateral LE. Stage 2 ulcers to RLE with mild surrounding erythema (improved per patient), no significant warmth, no active drainge  Neurological:     General: No focal deficit present.     Mental Status: She is alert and oriented to person, place, and time.  Psychiatric:        Mood and Affect: Mood normal.        Behavior: Behavior normal.        ED Results / Procedures / Treatments   Labs (all labs ordered are listed, but only abnormal results are displayed) Labs Reviewed  BASIC METABOLIC PANEL - Abnormal; Notable for the following components:      Result Value   CO2 16 (*)    Glucose, Bld 117 (*)    Creatinine, Ser 1.26 (*)    GFR, Estimated 46 (*)    All other components within normal limits  CBC - Abnormal; Notable for the following components:   RBC 3.85 (*)    Hemoglobin 9.8 (*)    HCT 33.1 (*)    MCH 25.5 (*)    MCHC 29.6 (*)    RDW 18.1 (*)    All other components within normal limits  BRAIN NATRIURETIC PEPTIDE - Abnormal; Notable for the following components:   B Natriuretic Peptide 481.6 (*)    All other components within normal limits  TROPONIN I (HIGH SENSITIVITY)  TROPONIN I (HIGH SENSITIVITY)     EKG EKG Interpretation Date/Time:  Thursday September 15 2022 12:50:57 EDT Ventricular Rate:  86 PR Interval:    QRS Duration:  96 QT Interval:  382 QTC Calculation: 457 R Axis:   7  Text Interpretation: Atrial fibrillation Moderate  voltage criteria for LVH, may be normal variant ( R in aVL , Cornell product ) ST & T wave abnormality, consider lateral ischemia Abnormal ECG Since last tracing of earlier today No significant change was found Confirmed by Elayne Snare ([id number]) on 09/15/2022 2:21:36 PM  Radiology DG Chest 2 View  Result Date: 09/15/2022 CLINICAL DATA:  Shortness of breath. EXAM: CHEST - 2 VIEW COMPARISON:  08/04/2022 FINDINGS: The cardio pericardial silhouette is enlarged. Interstitial markings are diffusely coarsened with chronic features. The lungs are clear without focal pneumonia, edema, pneumothorax or pleural effusion. No acute bony abnormality. IMPRESSION: Chronic interstitial coarsening without acute cardiopulmonary findings. Electronically Signed   By: Kennith Center M.D.   On: 09/15/2022 14:11    Procedures Procedures    Medications Ordered in ED Medications  furosemide (LASIX) injection 40 mg (has no administration in time range)    ED Course/ Medical Decision Making/ A&P Clinical Course as of 09/15/22 1543  Thu Sep 15, 2022  1452 I spoke with cardiology who will evaluate for admission. [VK]  [id number] Cardiology evaluated at bedside and stated will start her on 80mg  BID IV lasix. Obtain echo. Maybe set her up for TEE DCCV while hospitalized. Recommended hospitalist admission. [VK]  1543 Patient signed out to family medicine team for admission. [VK]    Clinical Course User Index [VK] Rexford Maus, DO                                 Medical Decision Making This patient presents to the ED with chief complaint(s) of SOB with pertinent past medical history of CHF, A fib on Eliquis which further complicates the presenting complaint. The complaint  involves an extensive differential diagnosis and also carries with it a high risk of complications and morbidity.    The differential diagnosis includes CHF exacerbation, pulmonary edema, pleural effusion, pneumonia, pneumothorax, ACS, arrhythmia, anemia  Additional history obtained: Additional history obtained from N/a Records reviewed outpatient cardiology records  ED Course and Reassessment: On patient's arrival she is hemodynamically stable in no acute distress.  She is initially evaluated by triage and had EKG, labs and chest x-ray performed.  EKG showed atrial fibrillation that was rate controlled without acute ischemic changes.  Labs showed mildly elevated BNP of 480, troponin within normal range.  Retinae at approximately at baseline.  Chest x-ray showed no significant volume overload.  She does have significant pitting edema to her lower extremities and will be given 40 mg of IV Lasix.  Will speak with cardiology regarding admission for management of her CHF exacerbation and A-fib.  Independent labs interpretation:  The following labs were independently interpreted: Mildly elevated BNP, otherwise at baseline  Independent visualization of imaging: - I independently visualized the following imaging with scope of interpretation limited to determining acute life threatening conditions related to emergency care: Chest x-ray, which revealed no acute disease  Consultation: - Consulted or discussed management/test interpretation w/ external professional: Cardiology  Consideration for admission or further workup: Patient requires admission for CHF exacerbation and A-fib Social Determinants of health: N/A    Amount and/or Complexity of Data Reviewed Labs: ordered. Radiology: ordered.  Risk Prescription drug management.          Final Clinical Impression(s) / ED Diagnoses Final diagnoses:  Acute on chronic congestive heart failure, unspecified heart failure type (HCC)  Atrial  fibrillation by electrocardiogram [facility])    Rx / DC Orders ED  Discharge Orders     None         Rexford Maus, [place] 09/15/22 1458

## 2022-09-15 NOTE — ED Triage Notes (Signed)
Reports starting have palpitations, sob and leg swelling. Hx of afib and chf. Called her MD and told to come here

## 2022-09-15 NOTE — ED Notes (Signed)
ED TO INPATIENT HANDOFF REPORT  ED Nurse Name and Phone #: Einar Grad [phone]  S Name/Age/Gender Christine Jacobson 67 y.o. female Room/Bed: 009C/009C  Code Status   Code Status: Limited: Do not attempt resuscitation (DNR) -DNR-LIMITED -Do Not Intubate/DNI   Home/SNF/Other Home Patient oriented to: self, place, time, and situation Is this baseline? Yes   Triage Complete: Triage complete  Chief Complaint Acute exacerbation of CHF (congestive heart failure) (HCC) [I50.9]  Triage Note Reports starting have palpitations, sob and leg swelling. Hx of afib and chf. Called her MD and told to come here    Allergies Allergies  Allergen Reactions   Pork-Derived Products Other (See Comments)    Religious preference    Level of Care/Admitting Diagnosis ED Disposition     ED Disposition  Admit   Condition  --   Comment  Hospital Area: [facility] [[id number]]  Level of Care: Progressive [102]  Admit to Progressive based on following criteria: CARDIOVASCULAR & THORACIC of moderate stability with acute coronary syndrome symptoms/low risk myocardial infarction/hypertensive urgency/arrhythmias/heart failure potentially compromising stability and stable post cardiovascular intervention patients.  May place patient in observation at [facility] or [facility] if equivalent level of care is available:: No  Covid Evaluation: Asymptomatic - no recent exposure (last 10 days) testing not required  Diagnosis: Acute exacerbation of CHF (congestive heart failure) [facility]) [[id number]]  Admitting Physician: Para March [[id number]]  Attending Physician: Doreene Eland [[id number]]          B Medical/Surgery History 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   2D ECHO  - EF >55%, normal   HTN (hypertension)    Hyperprolactinemia (HCC)    dx in her 18, took meds, self d/c a while back   Liver function test abnormality    normal when repeated   Mild hyperlipidemia    Obesity    Palpitations    negative stress echo in November 2012 with normal LV function; mild LVH, proximal septal thickening with narrow LVOT and mild gradient; mild MR and TR; Cardionet showed PACs in November 2012   Pyoderma gangrenosa    Shortness of breath 07/11/2011   MET TEST   Past Surgical History:  Procedure Laterality Date   BIOPSY  08/10/2022   Procedure: BIOPSY;  Surgeon: Willis Modena, MD;  Location: [facility];  Service: Gastroenterology;;   CARDIOVERSION N/A 02/01/2022   Procedure: CARDIOVERSION;  Surgeon: Thomasene Ripple, DO;  Location: [facility] ENDOSCOPY;  Service: Cardiovascular;  Laterality: N/A;   COLONOSCOPY WITH PROPOFOL Left 08/10/2022   Procedure: COLONOSCOPY WITH PROPOFOL;  Surgeon: Willis Modena, MD;  Location: [facility] ENDOSCOPY;  Service: Gastroenterology;  Laterality: Left;   ESOPHAGOGASTRODUODENOSCOPY (EGD) WITH PROPOFOL Left 08/10/2022   Procedure: ESOPHAGOGASTRODUODENOSCOPY (EGD) WITH PROPOFOL;  Surgeon: Willis Modena, MD;  Location: [facility] ENDOSCOPY;  Service: Gastroenterology;  Laterality: Left;   POLYPECTOMY  08/10/2022   Procedure: POLYPECTOMY;  Surgeon: Willis Modena, MD;  Location: [facility] ENDOSCOPY;  Service: Gastroenterology;;   RIGHT HEART CATH N/A 10/15/2021   Procedure: RIGHT HEART CATH;  Surgeon: Elder Negus, MD;  Location: [facility] INVASIVE CV LAB;  Service: Cardiovascular;  Laterality: N/A;   SKIN GRAFT  2006   porcine, R leg     A IV Location/Drains/Wounds Patient Lines/Drains/Airways Status     Active Line/Drains/Airways  Name Placement date Placement time Site Days   Peripheral IV 09/15/22 20 G Right Antecubital 09/15/22  1451  Antecubital  less than 1   Wound / Incision (Open or Dehisced) 08/05/22 Non-pressure wound Tibial Posterior;Right  venous ulcers x 2; 7 cm x 3 cm and 3 cm x 3 cm 08/05/22  0809  Tibial  41            Intake/Output Last 24 hours No intake or output data in the 24 hours ending 09/15/22 1912  Labs/Imaging Results for orders placed or performed during the hospital encounter of 09/15/22 (from the past 48 hour(s))  Basic metabolic panel     Status: Abnormal   Collection Time: 09/15/22  1:07 PM  Result Value Ref Range   Sodium 135 135 - 145 mmol/L   Potassium 4.6 3.5 - 5.1 mmol/L   Chloride 108 98 - 111 mmol/L   CO2 16 (L) 22 - 32 mmol/L   Glucose, Bld 117 (H) 70 - 99 mg/dL    Comment: Glucose reference range applies only to samples taken after fasting for at least 8 hours.   BUN 18 8 - 23 mg/dL   Creatinine, Ser 9.62 (H) 0.44 - 1.00 mg/dL   Calcium 9.8 8.9 - 95.2 mg/dL   GFR, Estimated 46 (L) >60 mL/min    Comment: (NOTE) Calculated using the CKD-EPI Creatinine Equation (2021)    Anion gap 11 5 - 15    Comment: Performed at [facility], [address], [place], [place] [address]  CBC     Status: Abnormal   Collection Time: 09/15/22  1:07 PM  Result Value Ref Range   WBC 9.9 4.0 - 10.5 K/uL   RBC 3.85 (L) 3.87 - 5.11 MIL/uL   Hemoglobin 9.8 (L) 12.0 - 15.0 g/dL   HCT 44.0 (L) 10.2 - 72.5 %   MCV 86.0 80.0 - 100.0 fL   MCH 25.5 (L) 26.0 - 34.0 pg   MCHC 29.6 (L) 30.0 - 36.0 g/dL   RDW 36.6 (H) 44.0 - 34.7 %   Platelets 318 150 - 400 K/uL   nRBC 0.0 0.0 - 0.2 %    Comment: Performed at [facility], [address], [place], [place] [address]  Troponin I (High Sensitivity)     Status: None   Collection Time: 09/15/22  1:07 PM  Result Value Ref Range   Troponin I (High Sensitivity) 13 <18 ng/L    Comment: (NOTE) Elevated high sensitivity troponin I (hsTnI) values and significant  changes across serial measurements may suggest ACS but many other  chronic and acute conditions are known to elevate hsTnI results.  Refer to the "Links" section for chest pain algorithms and  additional  guidance. Performed at [facility], [address] [place], [place] [address]   Brain natriuretic peptide     Status: Abnormal   Collection Time: 09/15/22  1:07 PM  Result Value Ref Range   B Natriuretic Peptide 481.6 (H) 0.0 - 100.0 pg/mL    Comment: Performed at [facility], [address], [place], [place] [address]  Troponin I (High Sensitivity)     Status: None   Collection Time: 09/15/22  2:53 PM  Result Value Ref Range   Troponin I (High Sensitivity) 14 <18 ng/L    Comment: (NOTE) Elevated high sensitivity troponin I (hsTnI) values and significant  changes across serial measurements may suggest ACS but many other  chronic and acute conditions  are known to elevate hsTnI results.  Refer to the "Links" section for chest pain algorithms and additional  guidance. Performed at [facility], [address], [place], [place] [address]    DG Chest 2 View  Result Date: 09/15/2022 CLINICAL DATA:  Shortness of breath. EXAM: CHEST - 2 VIEW COMPARISON:  08/04/2022 FINDINGS: The cardio pericardial silhouette is enlarged. Interstitial markings are diffusely coarsened with chronic features. The lungs are clear without focal pneumonia, edema, pneumothorax or pleural effusion. No acute bony abnormality. IMPRESSION: Chronic interstitial coarsening without acute cardiopulmonary findings. Electronically Signed   By: Kennith Center M.D.   On: 09/15/2022 14:11    Pending Labs Unresulted Labs (From admission, onward)     Start     Ordered   09/16/22 0500  Basic metabolic panel  Tomorrow morning,   R        09/15/22 1704   09/16/22 0500  CBC  Tomorrow morning,   R        09/15/22 1704   09/16/22 0500  Magnesium  Tomorrow morning,   R        09/15/22 1704   09/16/22 0500  TSH  Tomorrow morning,   R        09/15/22 1848            Vitals/Pain Today's Vitals   09/15/22 1820 09/15/22 1820 09/15/22 1826 09/15/22 1900  BP:   (!) 85/60 103/88  Pulse:  86 88 82   Resp:  18 17 20   Temp:  97.6 F (36.4 C)    TempSrc:  Oral    SpO2:  99%  100%  Weight:      Height:      PainSc: 4        Isolation Precautions No active isolations  Medications Medications  atorvastatin (LIPITOR) tablet 40 mg (40 mg Oral Given 09/15/22 1830)  pantoprazole (PROTONIX) EC tablet 40 mg (40 mg Oral Given 09/15/22 1830)  hydrOXYzine (ATARAX) tablet 10 mg (has no administration in time range)  midodrine (PROAMATINE) tablet 5 mg (5 mg Oral Given 09/15/22 1840)  apixaban (ELIQUIS) tablet 5 mg (has no administration in time range)  furosemide (LASIX) injection 40 mg (40 mg Intravenous Given 09/15/22 1502)  acetaminophen (TYLENOL) tablet 650 mg (650 mg Oral Given 09/15/22 1605)    Mobility walks with device     Focused Assessments Cardiac Assessment Handoff:  Cardiac Rhythm: Atrial fibrillation Lab Results  Component Value Date   CKTOTAL 110 05/26/2011   CKMB 4.0 05/26/2011   Lab Results  Component Value Date   DDIMER 0.42 08/04/2022   Does the Patient currently have chest pain? No    R Recommendations: See Admitting Provider Note  Report given to:   Additional Notes:

## 2022-09-16 ENCOUNTER — Observation Stay (HOSPITAL_COMMUNITY): Payer: Medicare PPO

## 2022-09-16 ENCOUNTER — Encounter (HOSPITAL_COMMUNITY)
Admission: EM | Disposition: A | Discharge status: Home Health | Payer: Self-pay | Source: Home / Self Care | Attending: Family Medicine

## 2022-09-16 DIAGNOSIS — D649 Anemia, unspecified: Secondary | ICD-10-CM

## 2022-09-16 DIAGNOSIS — I509 Heart failure, unspecified: Secondary | ICD-10-CM | POA: Diagnosis not present

## 2022-09-16 DIAGNOSIS — I5030 Unspecified diastolic (congestive) heart failure: Secondary | ICD-10-CM

## 2022-09-16 DIAGNOSIS — I421 Obstructive hypertrophic cardiomyopathy: Secondary | ICD-10-CM

## 2022-09-16 DIAGNOSIS — I480 Paroxysmal atrial fibrillation: Secondary | ICD-10-CM | POA: Diagnosis not present

## 2022-09-16 DIAGNOSIS — M7989 Other specified soft tissue disorders: Secondary | ICD-10-CM

## 2022-09-16 DIAGNOSIS — F419 Anxiety disorder, unspecified: Secondary | ICD-10-CM | POA: Insufficient documentation

## 2022-09-16 LAB — RETICULOCYTES
Immature Retic Fract: 33.9 % — ABNORMAL HIGH (ref 2.3–15.9)
RBC.: 3.39 MIL/uL — ABNORMAL LOW (ref 3.87–5.11)
Retic Count, Absolute: 63.4 10*3/uL (ref 19.0–186.0)
Retic Ct Pct: 1.9 % (ref 0.4–3.1)

## 2022-09-16 LAB — BASIC METABOLIC PANEL
Anion gap: 13 (ref 5–15)
BUN: 26 mg/dL — ABNORMAL HIGH (ref 8–23)
CO2: 19 mmol/L — ABNORMAL LOW (ref 22–32)
Calcium: 9.4 mg/dL (ref 8.9–10.3)
Chloride: 107 mmol/L (ref 98–111)
Creatinine, Ser: 1.9 mg/dL — ABNORMAL HIGH (ref 0.44–1.00)
GFR, Estimated: 28 mL/min — ABNORMAL LOW (ref >60)
Glucose, Bld: 141 mg/dL — ABNORMAL HIGH (ref 70–99)
Potassium: 4.2 mmol/L (ref 3.5–5.1)
Sodium: 139 mmol/L (ref 135–145)

## 2022-09-16 LAB — CBC
HCT: 27 % — ABNORMAL LOW (ref 36.0–46.0)
Hemoglobin: 8.3 g/dL — ABNORMAL LOW (ref 12.0–15.0)
MCH: 25.2 pg — ABNORMAL LOW (ref 26.0–34.0)
MCHC: 30.7 g/dL (ref 30.0–36.0)
MCV: 81.8 fL (ref 80.0–100.0)
Platelets: 270 10*3/uL (ref 150–400)
RBC: 3.3 MIL/uL — ABNORMAL LOW (ref 3.87–5.11)
RDW: 18.1 % — ABNORMAL HIGH (ref 11.5–15.5)
WBC: 10.3 10*3/uL (ref 4.0–10.5)
nRBC: 0 % (ref 0.0–0.2)

## 2022-09-16 LAB — FERRITIN: Ferritin: 32 ng/mL (ref 11–307)

## 2022-09-16 LAB — ECHOCARDIOGRAM COMPLETE
Area-P 1/2: 2.72 cm2
Height: 64 in
S' Lateral: 2.4 cm
Weight: 3146.41 oz

## 2022-09-16 LAB — IRON AND TIBC
Iron: 23 ug/dL — ABNORMAL LOW (ref 28–170)
Saturation Ratios: 6 % — ABNORMAL LOW (ref 10.4–31.8)
TIBC: 384 ug/dL (ref 250–450)
UIBC: 361 ug/dL

## 2022-09-16 LAB — VITAMIN B12: Vitamin B-12: 484 pg/mL (ref 180–914)

## 2022-09-16 LAB — TSH: TSH: 0.56 u[IU]/mL (ref 0.350–4.500)

## 2022-09-16 LAB — FOLATE: Folate: 7.9 ng/mL (ref >5.9)

## 2022-09-16 LAB — MAGNESIUM: Magnesium: 2 mg/dL (ref 1.7–2.4)

## 2022-09-16 SURGERY — ECHOCARDIOGRAM, TRANSESOPHAGEAL
Anesthesia: Monitor Anesthesia Care

## 2022-09-16 MED ORDER — ATORVASTATIN CALCIUM 80 MG PO TABS
80.0000 mg | ORAL_TABLET | Freq: Every day | ORAL | Status: DC
  Administered 2022-09-17 – 2022-09-21 (×5): 80 mg via ORAL
  Filled 2022-09-16 (×5): qty 1

## 2022-09-16 MED ORDER — MELATONIN 5 MG PO TABS
5.0000 mg | ORAL_TABLET | Freq: Every day | ORAL | Status: DC
  Administered 2022-09-16 – 2022-09-18 (×4): 5 mg via ORAL
  Filled 2022-09-16 (×4): qty 1

## 2022-09-16 MED ORDER — ESCITALOPRAM OXALATE 10 MG PO TABS
5.0000 mg | ORAL_TABLET | Freq: Every day | ORAL | Status: DC
  Administered 2022-09-16 – 2022-09-21 (×6): 5 mg via ORAL
  Filled 2022-09-16 (×6): qty 1

## 2022-09-16 MED ORDER — HYDROXYZINE HCL 10 MG PO TABS
5.0000 mg | ORAL_TABLET | Freq: Three times a day (TID) | ORAL | Status: DC
  Administered 2022-09-16 (×3): 5 mg via ORAL
  Filled 2022-09-16 (×4): qty 1

## 2022-09-16 MED ORDER — SODIUM CHLORIDE 0.9 % IV SOLN: INTRAVENOUS | Status: DC

## 2022-09-16 NOTE — Care Management Obs Status (Signed)
MEDICARE OBSERVATION STATUS NOTIFICATION   Patient Details  Name: Christine Jacobson MRN: [id number] Date of Birth: [birth year]   Medicare Observation Status Notification Given:  Yes    Gala Lewandowsky, RN 09/16/2022, 4:16 PM

## 2022-09-16 NOTE — TOC Initial Note (Signed)
Transition of Care [facility]) - Initial/Assessment Note    Patient Details  Name: Christine Jacobson MRN: [id number] Date of Birth: 1955-05-29  Transition of Care [facility]) CM/SW Contact:    Gala Lewandowsky, RN Phone Number: 09/16/2022, 4:34 PM  Clinical Narrative: Patient presented for shortness of breath. PTA patient was from home alone. Patient reports that she had RN services in the past for wound care. She has been changing her wounds herself in the past. Patient now wants [facility] RN services for wound care. Patient has unna boots on in the room. Patient has used [facility] in the past and wants to use them again for services. Referral submitted to [facility] and start of care to begin within 24-48 hours post transition home.                    Expected Discharge Plan: Home w Home Health Services Barriers to Discharge: Continued Medical Work up   Patient Goals and CMS Choice Patient states their goals for this hospitalization and ongoing recovery are:: patient wants to return home.   Choice offered to / list presented to : Patient (patient has previously used [facility] in the past.)      Expected Discharge Plan and Services In-house Referral: NA Discharge Planning Services: CM Consult Post Acute Care Choice: Home Health Living arrangements for the past 2 months: Single Family Home                   DME Agency: NA       HH Arranged: RN, Disease Management HH Agency: [facility] Date [facility] Agency Contacted: 09/16/22 Time HH Agency Contacted: 1626 Representative spoke with at [facility] Agency: Kandee Keen  Prior Living Arrangements/Services Living arrangements for the past 2 months: Single Family Home Lives with:: Self Patient language and need for interpreter reviewed:: Yes Do you feel safe going back to the place where you live?: Yes      Need for Family Participation in Patient Care: Yes (Comment) Care giver support system in place?: Yes (comment) Current home services: DME (rolling  walker) Criminal Activity/Legal Involvement Pertinent to Current Situation/Hospitalization: No - Comment as needed  Activities of Daily Living Home Assistive Devices/Equipment: None ADL Screening (condition at time of admission) Patient's cognitive ability adequate to safely complete daily activities?: Yes Is the patient deaf or have difficulty hearing?: No Does the patient have difficulty seeing, even when wearing glasses/contacts?: No Does the patient have difficulty concentrating, remembering, or making decisions?: No Patient able to express need for assistance with ADLs?: Yes Does the patient have difficulty dressing or bathing?: No Independently performs ADLs?: Yes (appropriate for developmental age) Does the patient have difficulty walking or climbing stairs?: Yes Weakness of Legs: Both Weakness of Arms/Hands: None  Permission Sought/Granted Permission sought to share information with : Magazine features editor, Case Estate manager/land agent granted to share information with : Yes, Verbal Permission Granted     Permission granted to share info w AGENCY: [facility]        Emotional Assessment Appearance:: Appears stated age Attitude/Demeanor/Rapport: Engaged Affect (typically observed): Appropriate Orientation: : Oriented to Place, Oriented to  Time, Oriented to Situation Alcohol / Substance Use: Not Applicable Psych Involvement: No (comment)  Admission diagnosis:  Leg swelling [M79.89] Acute exacerbation of CHF (congestive heart failure) (HCC) [I50.9] Atrial fibrillation by electrocardiogram (HCC) [I48.91] Acute on chronic congestive heart failure, unspecified heart failure type [facility]) [I50.9] Patient Active Problem List   Diagnosis Date Noted   Anxiety 09/16/2022  Acute exacerbation of CHF (congestive heart failure) (HCC) 09/15/2022   Atrial fibrillation by electrocardiogram (HCC) 09/15/2022   Leg swelling 09/15/2022   GIB (gastrointestinal bleeding) 08/05/2022    Acquired thrombophilia (HCC) 08/05/2022   Chronic anticoagulation 08/05/2022   Acute blood loss anemia 08/04/2022   Cellulitis of both lower extremities 07/13/2022   Hypocarbia 07/13/2022   Normocytic anemia 07/13/2022   Cellulitis 06/02/2022   Sepsis due to cellulitis (HCC) 06/02/2022   AKI (acute kidney injury) (HCC) 06/02/2022   Hyponatremia 06/02/2022   Dehydration 06/02/2022   Metabolic acidosis 06/02/2022   Other secondary pulmonary hypertension (HCC)    Cellulitis and abscess of left lower extremity 10/13/2021   Persistent atrial fibrillation (HCC) 07/28/2021   Obesity (BMI 30-39.9)    Liver function test abnormality    Primary hypertension    Chronic diastolic heart failure (HCC) 12/15/2016   HOCM (hypertrophic obstructive cardiomyopathy) (HCC) 01/22/2016   Lower extremity edema 09/17/2014   History of syncope 12/05/2013   Syncope 12/05/2013   Pneumonia 11/23/2012   Shortness of breath 07/11/2011   Hypertension 11/23/2010   Chest pain 10/18/2010   Murmur 10/18/2010   Palpitations 09/23/2010   Mild hyperlipidemia    Elevated BP    Hyperprolactinemia (HCC)    Pyoderma gangrenosa    PCP:  Karenann Cai, NP Pharmacy:   [facility] [address] [place] [address] Phone: [phone] Fax: [phone]  Social Determinants of Health (SDOH) Social History: SDOH Screenings   Food Insecurity: No Food Insecurity (09/15/2022)  Housing: Low Risk  (09/15/2022)  Transportation Needs: No Transportation Needs (09/15/2022)  Utilities: Not At Risk (09/15/2022)  Depression (PHQ2-9): Low Risk  (10/26/2021)  Financial Resource Strain: Low Risk  (12/03/2021)  Tobacco Use: Medium Risk (09/15/2022)   Readmission Risk Interventions    08/07/2022   11:10 AM 07/14/2022    2:40 PM  Readmission Risk Prevention Plan  Post Dischage Appt  Complete  Medication Screening  Complete  Transportation Screening Complete   PCP or Specialist Appt  within 5-7 Days Complete   Home Care Screening Complete   Medication Review (RN CM) Complete

## 2022-09-16 NOTE — Progress Notes (Signed)
Orthopedic Tech Progress Note Patient Details:  Christine Jacobson [birth year] [id number]  Ortho Devices Type of Ortho Device: Radio broadcast assistant Ortho Device/Splint Location: bi-lateral Ortho Device/Splint Interventions: Ordered, Application, Adjustment   Post Interventions Patient Tolerated: Well Instructions Provided: Care of device, Adjustment of device  Trinna Post 09/16/2022, 12:54 PM

## 2022-09-16 NOTE — Discharge Instructions (Signed)
   Wound care instructions  Clean R leg open ulcers with NS, apply silver hydrofiber [place] [phone]) to wound beds daily, cover with ABD pads and Kerlix roll gauze beginning just above toes and ending right below knee.  Patient asked that heel be enclosed as well.  I did place an Ace bandage on top of Kerlix wrapped in same fashion for some mild compression.  No dressing to L leg at this time as it is dry.

## 2022-09-16 NOTE — Assessment & Plan Note (Addendum)
Patient with hemoglobin 8.3 this morning, decreased from 9.8 on admission yesterday.  Does appear that patient's baseline hemoglobin has been in the 7s since June.  Patient required blood transfusions during her last hospital admission, however had a colonoscopy and EGD at that time which showed no source of bleeding. -Anemia panel obtained this morning -Patient is asymptomatic -CBC a.m.

## 2022-09-16 NOTE — Assessment & Plan Note (Addendum)
Suspect this could be contributing to her dyspnea.  Concern for volume overload given missed Lasix doses and elevated BNP, although BNP lower than baseline of 1600 last month.  Could also be due to inadequate control of afib. Patient is rate controlled, but per cardiology could benefit from rhythm control as well. CXR without pleural effusions, but does have cardiomegaly. Does not appear profusely volume overloaded on exam and BNP elevated though not her max. Additionally has bilateral lower extremity edema. -Given Lasix 40 mg in the ED. Additional Lasix has been held since admission due to hypotension, consider redosing today -Pt prescribed Midodrine at home but unsure if taking, given one 5mg  dose on admission due to hypotension -Cardiology following, appreciate recs -Strict I&Os - spoke with nursing about this as no outputs have been documented, they will monitor today -Daily weights -TSH

## 2022-09-16 NOTE — Progress Notes (Addendum)
Patient Name: Christine Jacobson Date of Encounter: 09/16/2022 [facility] Cardiologist: Thomasene Ripple, DO   Interval Summary  .    Patient admitted yesterday for acute on chronic HFpEF exacerbation and found to be in atrial fibrillation again.  Patient very frustrated and upset with her hospitalization here.  She states she is losing patience with everything however cannot explicitly say. Very emotional during exam.   Vital Signs .    Vitals:   09/15/22 2234 09/15/22 2258 09/15/22 2324 09/16/22 0403  BP:  94/70 (!) 87/58 (!) 97/58  Pulse:  91 88 89  Resp:  (!) 21 20 18   Temp: 97.9 F (36.6 C) 98.5 F (36.9 C) 98 F (36.7 C) 98.4 F (36.9 C)  TempSrc: Oral Oral Oral Oral  SpO2:  100% 100% 100%  Weight:   90.9 kg 89.2 kg  Height:   5\' 4"  (1.626 m)     Intake/Output Summary (Last 24 hours) at 09/16/2022 0846 Last data filed at 09/15/2022 2340 Gross per 24 hour  Intake 300 ml  Output --  Net 300 ml      09/16/2022    4:03 AM 09/15/2022   11:24 PM 09/15/2022   12:44 PM  Last 3 Weights  Weight (lbs) 196 lb 10.4 oz 200 lb 6.4 oz 185 lb  Weight (kg) 89.2 kg 90.9 kg 83.915 kg      Telemetry/ECG    Afib 70-90s - Personally Reviewed  Physical Exam .   GEN: No acute distress.   Neck: No JVD Cardiac: RRR, no murmurs, rubs, or gallops.  Respiratory: Clear to auscultation bilaterally. GI: Soft, nontender, non-distended  MS: 2+ pitting edema bilatterally. Has wrap on R leg. L left looks flaky and erythematous and warm to the touch  Patient Profile    Christine Jacobson is a 67 y.o. female has hx of  obesity, hypertrophic cardiomyopathy with gradient of 64 mmHg that increases with valsalva, chronic HFpEF, PAF s/p prior DCCV 01/2022, and hyperlipidemia  and admitted on 09/15/2022 for the evaluation of CHF and recurrent AFIB.  She has recent admission in July of this year for anemia and had heart failure exacerbation.  Assessment & Plan .      Atrial fibrillation status post  cardioversion 01/2022 Prior to episode, EKG in July showed normal sinus rhythm.  There is concern for compliancy with her Eliquis.  She states she has been taking however is not taking and unfamiliar with some of her other medications.  Currently is rate controlled with heart rates in the 70s to 90s not on any beta-blocker likely due to hypotension requiring midodrine.  Given rate control and sudden drop in hemoglobin.  Performing MD for TEE/DCCV would like to allow the hemoglobin to stabilize before continuing.  Will need to plan for Monday then.  Orders will need to be placed on Sunday. Continue Eliquis 5 mg twice daily.  PTA Toprol XL 100mg .   HOCM mean gradient 64 with valsalva Moderate pulmonary HTN Echocardiogram 09/02/2021 showed EF 60 to 65%.  Severely dilated LA, moderately dilated RA.  RVSP 45.  Reported outpatient she was not taking her diuretic due to issues with incontinence.  BNP 1600, no signs of pulmonary congestion on imaging or physical exam.  She was given IV Lasix 40 mg yesterday however I's and O's have not been reported.  She has peripheral edema however this is difficult to determine whether this is more due to cellulitis versus her heart failure.  She does state that  she had improvement in symptoms with this however still seems to be short of breath.  GDMT currently restricted by hypotension and AKI.  Creatinine 1.9.  Ideally would be on SGLT2 inhibitor if no contraindications. Repeat surface echo when she is in normal sinus rhythm.  Leg swelling versus cellulitis Left leg is considerably warm, erythematous, flaking.  Has previous history of cellulitis.  Wound care consult pending.  AKI on CKD Renal function on admission 1.26.  Currently 1.9 after IV lasix 40mg  yesterday.  Baseline appears to be around 1-1.2.  Hyperlipidemia LDL 146 3 months ago.  Increase atorvastatin to 80 mg.  Mild MR, moderate TR Undergo TEE to further evaluate valves well.  Mental health Suspicious  for depression/anxiety.  Likely related to possible issues of noncompliance.  She is very frustrated today and not able to articulate exactly why.  Anemia Recently had admission that showed gastritis, hemorrhoids, diverticulosis.  Informed Consent   Shared Decision Making/Informed Consent{  The risks [stroke, cardiac arrhythmias rarely resulting in the need for a temporary or permanent pacemaker, skin irritation or burns, esophageal damage, perforation (1:10,000 risk), bleeding, pharyngeal hematoma as well as other potential complications associated with conscious sedation including aspiration, arrhythmia, respiratory failure and death], benefits (treatment guidance, restoration of normal sinus rhythm, diagnostic support) and alternatives of a transesophageal echocardiogram guided cardioversion were discussed in detail with Ms. Matkowski and she is willing to proceed.      For questions or updates, please contact [facility] Please consult [email or web address] for contact info under        Signed, Abagail Kitchens, PA-C    Patient seen and examined   I agree with findings as noted above  Pt currently still SOB    She is tearful at times   Frustrated says she never fel so bad      Claims she missed her lasix purposely   Did not miss Eliquis though    On exam Pt in NAD  Flat in bed Neck  JVP is normal Cardiac III/VI systolic murmur LSB to base     No S3 Abd is supple   Ext with 2+ edema  R leg wrapped  L leg with sore      HFpEF  In setting of severe LVH, HOCM May be due to noncompliance with meds ( not always taking lasix) May be exacerbated by atrial fibrillation Note she had similar symptoms last fall 2023  when in afib She should do better in SR  Admitted  8/29 for treat of CHF and  poss cardioversion    There is some concern about missed doses of meds given that she has missed lasix (she says intentional) and also pt being unfamiliar with some of meds   Would be safer to do TEE  prior     Unfortunately pt with high risk of recurrence of afib given severe LA dilation on echo  Pt was given one dose of lasix yesterday  No real response    SHe remains volume overloaded     Recomm: TTE to start before giving lasix   Schedule for TEE/DCCV  Atrial fibrillation   PAF Pt in SR in June 2024 NOw afib with controlled rates   Keep on tele    Echo to reevaluate chamber anatomy, function   Anemia   Hgb 8.3 today  Was 9.8 yesterday, 8 one month ago   In JUly 2024: colonoscopy showed hemorrhoids, diverticulosis, 2 polyps   NO evid for acute bleeding  EGD showed gastritis    Anemia panel drawn     Dietrich Pates MD

## 2022-09-16 NOTE — Evaluation (Signed)
Occupational Therapy Evaluation Patient Details Name: Christine Jacobson MRN: [id number] DOB: 02/05/1955 Today's Date: 09/16/2022   History of Present Illness Pt is a 67 y/o female presenting 8/29 with worsening dyspnea.  Work up includes acute on chronic CHF with Afib.  Pt also shows edematous LE's, weeping on the R LE.  PMHx:  HOCM, HTN, heart cath, cardioversion   Clinical Impression   Patient admitted for the diagnosis above.  PTA she lives in an apartment, sponges bathes at baseline, cares for her own medications, and performs light IADL.  Currently she is experiencing pain and swelling to her legs, needing Min A for lower body ADL and CGA for basic mobility.  OT will follow in the acute setting to address deficits, and no post acute OT is anticipated.         If plan is discharge home, recommend the following: Assist for transportation;Assistance with cooking/housework    Functional Status Assessment  Patient has had a recent decline in their functional status and demonstrates the ability to make significant improvements in function in a reasonable and predictable amount of time.  Equipment Recommendations  None recommended by OT    Recommendations for Other Services       Precautions / Restrictions Precautions Precautions: Fall Restrictions Weight Bearing Restrictions: No      Mobility Bed Mobility Overal bed mobility: Needs Assistance Bed Mobility: Supine to Sit, Sit to Supine     Supine to sit: Min assist Sit to supine: Min assist        Transfers Overall transfer level: Needs assistance   Transfers: Sit to/from Stand Sit to Stand: Contact guard assist                  Balance Overall balance assessment: Needs assistance Sitting-balance support: No upper extremity supported, Feet supported Sitting balance-Leahy Scale: Fair       Standing balance-Leahy Scale: Poor                             ADL either performed or assessed with  clinical judgement   ADL       Grooming: Wash/dry hands;Wash/dry face;Contact guard assist;Standing               Lower Body Dressing: Minimal assistance;Sit to/from stand   Toilet Transfer: Contact guard assist;Rolling walker (2 wheels);Regular Toilet;Ambulation                   Vision Patient Visual Report: No change from baseline       Perception Perception: Within Functional Limits       Praxis Praxis: WFL       Pertinent Vitals/Pain Pain Assessment Pain Assessment: Faces Faces Pain Scale: Hurts little more Pain Location: legs Pain Descriptors / Indicators: Tightness, Tender Pain Intervention(s): Monitored during session     Extremity/Trunk Assessment Upper Extremity Assessment Upper Extremity Assessment: Generalized weakness   Lower Extremity Assessment Lower Extremity Assessment: Defer to PT evaluation   Cervical / Trunk Assessment Cervical / Trunk Assessment: Kyphotic   Communication Communication Communication: No apparent difficulties   Cognition Arousal: Alert Behavior During Therapy: Flat affect Overall Cognitive Status: Within Functional Limits for tasks assessed                                       General Comments  vss, Sats upper 90's, HR  80's    Exercises     Shoulder Instructions      Home Living Family/patient expects to be discharged to:: Private residence Living Arrangements: Alone Available Help at Discharge: Friend(s);Available PRN/intermittently Type of Home: Apartment Home Access: Ramped entrance     Home Layout: One level     Bathroom Shower/Tub: Sponge bathes at baseline   Bathroom Toilet: Standard Bathroom Accessibility: Yes How Accessible: Accessible via walker Home Equipment: Rolling Walker (2 wheels)          Prior Functioning/Environment Prior Level of Function : Independent/Modified Independent             Mobility Comments: walks with RW; doesn't drive, friends take  her shopping, uses a shuttle bus to MD appointments ADLs Comments: grocery shops through [place], bird bathes at baseline, independent with ADLs and iADLs, kitchen and laundry are communal        OT Problem List: Decreased strength;Decreased activity tolerance;Impaired balance (sitting and/or standing)      OT Treatment/Interventions: Therapeutic activities;Self-care/ADL training;Balance training;Patient/family education    OT Goals(Current goals can be found in the care plan section) Acute Rehab OT Goals OT Goal Formulation: Patient unable to participate in goal setting Time For Goal Achievement: 09/30/22 Potential to Achieve Goals: Fair ADL Goals Pt Will Perform Grooming: with modified independence;standing Pt Will Perform Lower Body Dressing: with modified independence;sit to/from stand Pt Will Transfer to Toilet: with modified independence;regular height toilet;ambulating  OT Frequency: Min 1X/week    Co-evaluation              AM-PAC OT "6 Clicks" Daily Activity     Outcome Measure Help from another person eating meals?: None Help from another person taking care of personal grooming?: A Little Help from another person toileting, which includes using toliet, bedpan, or urinal?: A Little Help from another person bathing (including washing, rinsing, drying)?: A Little Help from another person to put on and taking off regular upper body clothing?: None Help from another person to put on and taking off regular lower body clothing?: A Little 6 Click Score: 20   End of Session Equipment Utilized During Treatment: Rolling walker (2 wheels) Nurse Communication: Mobility status  Activity Tolerance: Patient tolerated treatment well Patient left: in bed;with call bell/phone within reach  OT Visit Diagnosis: Unsteadiness on feet (R26.81)                Time: 1343-1400 OT Time Calculation (min): 17 min Charges:  OT General Charges $OT Visit: 1 Visit OT Evaluation $OT Eval  Moderate Complexity: 1 Mod  09/16/2022  RP, OTR/L  Acute Rehabilitation Services  Office:  [phone]   Suzanna Obey 09/16/2022, 2:07 PM

## 2022-09-16 NOTE — Assessment & Plan Note (Addendum)
Patient has been anxious since arrival, due to multiple recent hospital admissions and some stressors in her personal life.  Patient is prescribed hydroxyzine 10 mg 3 times daily at home as needed for anxiety -Added hydroxyzine 5 mg 3 times daily scheduled for anxiety, and itching of her legs -will discuss with her potentially starting SSRI or having chaplain come speak with her

## 2022-09-16 NOTE — Progress Notes (Signed)
Heart Failure Nurse Navigator Progress Note  Navigation team following this hospitalization.   ECHO: 65-70%, sev LVH, sev elevated PA pressure  Will continue to follow  Meredith Staggers, RN, BSN, [facility] Heart Failure Navigator Heart & Vascular Care Navigation Team

## 2022-09-16 NOTE — Consult Note (Signed)
ADDENDUM:  Received secure chat from primary medical team that unna boots were desired while inpatient.  I placed order for unna boots to be placed today bilaterally and then changed Mondays/Thursdays each week.  Wound care orders for right leg changed to Mondays and Thursdays before placement of new unna boots.  Will only need silver hydrofiber placed on open ulcers to right leg prior to unna boots.  This RN called ortho tech to notify of order and secure chat sent to bedside nurse.    WOC team will not follow.  Re-consult if further needs arise.   Thank you,    Priscella Mann MSN, RN-BC, [employer] [phone]

## 2022-09-16 NOTE — Evaluation (Signed)
Physical Therapy Evaluation Patient Details Name: Christine Jacobson MRN: [id number] DOB: [birth year] Today's Date: 09/16/2022  History of Present Illness  Pt is a 67 y/o female presenting 8/29 with worsening dyspnea.  Work up includes acute on chronic CHF with Afib.  Pt also shows edematous LE's, weeping on the R LE.  PMHx:  HOCM, HTN, heart cath, cardioversion  Clinical Impression  Pt admitted with/for worsening dyspnea and complications stated above.  Pt was sleepy and moving laboriously, but may be close to baseline with removal of some of her fluid.  Pt currently limited functionally due to the problems listed below.  (see problems list.)  Pt will benefit from PT to maximize function and safety to be able to get home safely with available assist .         If plan is discharge home, recommend the following: Assistance with cooking/housework;Assist for transportation   Can travel by private vehicle        Equipment Recommendations None recommended by PT (pt doesn't want BSC)  Recommendations for Other Services       Functional Status Assessment Patient has had a recent decline in their functional status and demonstrates the ability to make significant improvements in function in a reasonable and predictable amount of time.     Precautions / Restrictions Precautions Precautions: Fall Restrictions Weight Bearing Restrictions: No      Mobility  Bed Mobility Overal bed mobility: Needs Assistance Bed Mobility: Supine to Sit, Sit to Supine     Supine to sit: Contact guard Sit to supine: Min assist   General bed mobility comments: effortful transitions, needed minimal assist to get legs in.    Transfers Overall transfer level: Needs assistance   Transfers: Sit to/from Stand Sit to Stand: Contact guard assist           General transfer comment: used UE's appropropriately for safety    Ambulation/Gait Ambulation/Gait assistance: Contact guard assist Gait Distance  (Feet): 18 Feet Assistive device: Rolling walker (2 wheels) Gait Pattern/deviations: Step-through pattern   Gait velocity interpretation: <1.31 ft/sec, indicative of household ambulator   General Gait Details: mildly unsteady, slow and effortful.  safe use of the RW  Stairs            Wheelchair Mobility     Tilt Bed    Modified Rankin (Stroke Patients Only)       Balance Overall balance assessment: Needs assistance Sitting-balance support: No upper extremity supported, Feet supported Sitting balance-Leahy Scale: Fair       Standing balance-Leahy Scale: Poor Standing balance comment: reliant on the AD                             Pertinent Vitals/Pain Pain Assessment Pain Assessment: Faces Faces Pain Scale: Hurts a little bit Pain Location: legs Pain Descriptors / Indicators: Discomfort, Grimacing Pain Intervention(s): Monitored during session    Home Living Family/patient expects to be discharged to:: Private residence Living Arrangements: Alone Available Help at Discharge: Friend(s);Available PRN/intermittently Type of Home: Apartment Home Access: Ramped entrance       Home Layout: One level Home Equipment: Agricultural consultant (2 wheels)      Prior Function Prior Level of Function : Independent/Modified Independent             Mobility Comments: walks with RW; doesn't drive, friends take her shopping, uses a shuttle bus to MD appointments ADLs Comments: grocery shops through [place], bird bathes at  baseline, independent with ADLs and iADLs, kitchen and laundry are communal     Extremity/Trunk Assessment   Upper Extremity Assessment Upper Extremity Assessment: Generalized weakness    Lower Extremity Assessment Lower Extremity Assessment: Generalized weakness    Cervical / Trunk Assessment Cervical / Trunk Assessment: Kyphotic  Communication   Communication Communication: No apparent difficulties  Cognition Arousal:  Lethargic Behavior During Therapy: Flat affect Overall Cognitive Status: Difficult to assess                                          General Comments General comments (skin integrity, edema, etc.): vss, Sats upper 90's, HR 80's    Exercises     Assessment/Plan    PT Assessment Patient needs continued PT services  PT Problem List Decreased strength;Decreased activity tolerance;Decreased mobility;Decreased balance       PT Treatment Interventions Gait training;Functional mobility training;DME instruction;Therapeutic activities;Patient/family education    PT Goals (Current goals can be found in the Care Plan section)  Acute Rehab PT Goals Patient Stated Goal: home/feeling better. PT Goal Formulation: With patient Time For Goal Achievement: 09/30/22 Potential to Achieve Goals: Good    Frequency Min 1X/week     Co-evaluation               AM-PAC PT "6 Clicks" Mobility  Outcome Measure Help needed turning from your back to your side while in a flat bed without using bedrails?: A Little Help needed moving from lying on your back to sitting on the side of a flat bed without using bedrails?: A Little Help needed moving to and from a bed to a chair (including a wheelchair)?: A Little Help needed standing up from a chair using your arms (e.g., wheelchair or bedside chair)?: A Little Help needed to walk in hospital room?: A Lot   6 Click Score: 14    End of Session   Activity Tolerance: Patient tolerated treatment well;Patient limited by fatigue Patient left: in bed;with call bell/phone within reach Nurse Communication: Mobility status PT Visit Diagnosis: Unsteadiness on feet (R26.81);Other abnormalities of gait and mobility (R26.89)    Time: 1055-1110 PT Time Calculation (min) (ACUTE ONLY): 15 min   Charges:   PT Evaluation $PT Eval Moderate Complexity: 1 Mod   PT General Charges $$ ACUTE PT VISIT: 1 Visit         09/16/2022  Jacinto Halim.,  PT Acute Rehabilitation Services [phone]  (office)  Eliseo Gum Orvis Stann 09/16/2022, 11:29 AM

## 2022-09-16 NOTE — Progress Notes (Signed)
FMTS Brief Progress Note  S: Christine Jacobson is a 67 y.o. female with a history of Afib, CHF, lower extremity venous stasis presenting with dyspnea, admitted for acute on chronic CHF exacerbation, atrial fibrillation with plan for DCCV.  Saw patient at bedside with Dr. Marisue Humble.  Patient reports she is having trouble sleeping and is anxious because this is her 4th hospitalization this year.  She states she will try to sleep and would appreciate some medicines to help her sleep.  O: BP (!) 87/58 (BP Location: Left Arm)   Pulse 88   Temp 98 F (36.7 C) (Oral)   Resp 20   Ht 5\' 4"  (1.626 m)   Wt 90.9 kg   SpO2 100%   BMI 34.40 kg/m   General: Adult female, resting in bed, NAD but appears somewhat anxious, alert and at baseline. Cardiovascular: Normal rate and irregular rhythm. 3/6 systolic blowing murmur.  Unable to evaluate JVD.  Normal S1/S2.  No rubs or gallops appreciated. 2+ radial pulses. Pulmonary: No increased WOB, no accessory muscle usage on room air. Skin: Warm and dry. Extremities: Venous stasis changes over BLE.  Some dependent edema.  RLE with gauze dressing wrap, no drainage, dry.  2+ DP and PT pulses bilaterally.  Capillary refill <2 seconds.  A/P: -Patient is anxious and resting in bed, having difficulty sleeping; ordered 5 mg melatonin x1 -Noted murmur is previously documented, cardiology already following, no additional management planned at this time -NPO since midnight, will optimize electrolytes in AM with goal K>4 and mag>2, prepare for DCCV -No I/O documented, RN reports patient had 1 unmeasured urine occurrence when arrived on floor -Orders reviewed. Labs for AM ordered, which were adjusted as needed -If condition changes, plan includes   Christine Mulroy, MD 09/16/2022, 1:03 AM PGY-1, [facility] Family [facility] Night Resident  Please page [phone] with questions.

## 2022-09-16 NOTE — Consult Note (Signed)
WOC Nurse Consult Note: this patient is well known to WOC team from multiple previous admissions; has longstanding venous insufficiency that she [facility] but stopped going; at last admission in July she was receiving home health who were coming out and placing unna boots weekly at that time.  Patient states home health stopped coming and she has been wrapping in gauze at home. Condition of legs has clearly declined since seen by this RN back in July.  Reason for Consult: leg wounds  Wound type: full thickness r/t venous insufficiency w/inflammatory response  Pressure Injury POA: NA  Measurement: 1.  R leg with scattered areas of full thickness skin loss anterior aspect 1 cm x 1 cm 75% yellow 25% pink moist; R posterior leg noted to have a 14 cm x 10 cm area of full thickness skin loss 100% red, moist and very painful for patient.   2. L leg is dry;one area of full thickness skin loss medial aspect 5 cm x 5 cm that is covered with dry yellow exudate; no open weeping wounds noted to Left leg at this time  Historically R leg appears to be more affected.    Drainage (amount, consistency, odor) R leg with moderate amount of serous drainage (soaked through onto bed)  Periwound: erythematous, edematous, itching per patient  Dressing procedure/placement/frequency: Clean R leg open ulcers with NS, apply silver hydrofiber [facility] [phone]) to wound beds daily, cover with ABD pads and Kerlix roll gauze beginning just above toes and ending right below knee.  Patient asked that heel be enclosed as well.  I did place an Ace bandage on top of Kerlix wrapped in same fashion for some mild compression.  No dressing to L leg at this time as it is dry.   Primary MD arrived at bedside during evaluation.  Discussed with Dr. Lum Babe that patient had benefited from unna boots in the past but must have follow-up if unna boots are applied as an inpatient.  Other option is apply unna boots inpatient and cut off  when she is discharged.  Patient would either need home health or be willing to follow-up at [facility] for ongoing placement of unna boots.  Dr. Lum Babe will discuss with rest of primary medical team and contact WOC team if unna boot placement desired inpatient.    Had another discussion with patient that venous insufficiency must be managed with compression on an ongoing basis. Patient needs follow-up at [facility] or with Home Health at discharge if possible.    Thank you,    Priscella Mann MSN, RN-BC, [employer] [phone]

## 2022-09-16 NOTE — Assessment & Plan Note (Addendum)
Patient's creatinine increased to 1.9 from 1.26 on admission yesterday.  Appears her baseline is a little over 1. -Baseline GFR of 51-52 -Continue to hold additional Lasix diuresis -Caution with nephrotoxic agents

## 2022-09-16 NOTE — Assessment & Plan Note (Addendum)
Chronic BLE swelling with venous stasis changes. RLE was wrapped in a dressing on exam by wound care this morning - low suspicion for cellulitis, no significant warmth, drainage, afebrile - low suspicion for dvt, pt reliably takes eliquis, no asymmetric leg swelling or calf tenderness - wound care consulted, appreciate assistance with this patient

## 2022-09-16 NOTE — Progress Notes (Signed)
Daily Progress Note Intern Pager: [phone]  Patient name: Christine Jacobson Medical record number: [id number] Date of birth: March 23, 1955 Age: 67 y.o. Gender: female  Primary Care Provider: Karenann Cai, NP Consultants: cardiology Code Status: DNR  Pt Overview and Major Events to Date:  8/29 - admitted  Assessment and Plan:  67 year old female with past medical history of HOCM, PAF, diastolic CHF, hypertension who presented initially from her cardiologist office due to dyspnea thought to be secondary to fluid overload.  Of note, patient has not been taking her Lasix at home because she is unable to make it to the bathroom in time.  Cardiology plans to cardiovert patient today for better rhythm control.  Patient has been rate controlled since arrival.  Patient also with chronic lower extremity swelling and venous stasis changes, wound care consulted and is assisting patient with dressing changes.  I suspect patient's CHF is contributing to her shortness of breath, however her chest x-ray did show chronic interstitial coarsening.  Wonder if patient may have an underlying pulmonary disease.  Assessment & Plan Acute exacerbation of CHF (congestive heart failure) (HCC) Suspect this could be contributing to her dyspnea.  Concern for volume overload given missed Lasix doses and elevated BNP, although BNP lower than baseline of 1600 last month.  Could also be due to inadequate control of afib. Patient is rate controlled, but per cardiology could benefit from rhythm control as well. CXR without pleural effusions, but does have cardiomegaly. Does not appear profusely volume overloaded on exam and BNP elevated though not her max. Additionally has bilateral lower extremity edema. -Given Lasix 40 mg in the ED. Additional Lasix has been held since admission due to hypotension, consider redosing today -Pt prescribed Midodrine at home but unsure if taking, given one 5mg  dose on admission due to  hypotension -Cardiology following, appreciate recs -Strict I&Os - spoke with nursing about this as no outputs have been documented, they will monitor today -Daily weights -TSH Atrial fibrillation by electrocardiogram [facility]) Patient is rate controlled, on eliquis at home.  - Cardiology plans for DCCV/TEE - Remains NPO - Patient has been rate controlled since arrival to the ED - Continued home Eliquis, pt states she has been taking this regularly - have been holding home metoprolol due to hypotension Normocytic anemia Patient with hemoglobin 8.3 this morning, decreased from 9.8 on admission yesterday.  Does appear that patient's baseline hemoglobin has been in the 7s since June.  Patient required blood transfusions during her last hospital admission, however had a colonoscopy and EGD at that time which showed no source of bleeding. -Anemia panel obtained this morning -Patient is asymptomatic -CBC a.m. AKI (acute kidney injury) (HCC) Patient's creatinine increased to 1.9 from 1.26 on admission yesterday.  Appears her baseline is a little over 1. -Baseline GFR of 51-52 -Continue to hold additional Lasix diuresis -Caution with nephrotoxic agents Leg swelling Chronic BLE swelling with venous stasis changes. RLE was wrapped in a dressing on exam by wound care this morning - low suspicion for cellulitis, no significant warmth, drainage, afebrile - low suspicion for dvt, pt reliably takes eliquis, no asymmetric leg swelling or calf tenderness - wound care consulted, appreciate assistance with this patient Anxiety Patient has been anxious since arrival, due to multiple recent hospital admissions and some stressors in her personal life.  Patient is prescribed hydroxyzine 10 mg 3 times daily at home as needed for anxiety -Added hydroxyzine 5 mg 3 times daily scheduled for anxiety, and itching  of her legs -will discuss with her potentially starting SSRI or having chaplain come speak with  her   Chronic and Stable Problems:  HTN  HLD - increased atorvastatin to 80mg  per cards  FEN/GI: NPO pending DCCV today PPx: eliquis Dispo:Home pending clinical improvement .  Subjective:  No acute events overnight.  Patient remains anxious about being in the hospital and being sick.  States that her right leg is very itchy after wound care evaluated her this morning.  Requests something for anxiety and itching.  Objective: Temp:  [97.5 F (36.4 C)-98.5 F (36.9 C)] 98.4 F (36.9 C) (08/30 0403) Pulse Rate:  [82-101] 89 (08/30 0403) Resp:  [15-21] 18 (08/30 0403) BP: (83-118)/(47-88) 97/58 (08/30 0403) SpO2:  [98 %-100 %] 100 % (08/30 0403) Weight:  [83.9 kg-90.9 kg] 89.2 kg (08/30 0403) Physical Exam: General: No acute distress, resting comfortably Cardiovascular: Patient in A-fib.  Regular rate.  Systolic murmur noted Respiratory: CTAB, no increased work of breathing Abdomen: Soft, nontender Extremities: Right lower extremity wrapped from knee down.  No sign of increased swelling bilateral lower extremities.  No calf TTP bilaterally.  Laboratory: Most recent CBC Lab Results  Component Value Date   WBC 10.3 09/16/2022   HGB 8.3 (L) 09/16/2022   HCT 27.0 (L) 09/16/2022   MCV 81.8 09/16/2022   PLT 270 09/16/2022   Most recent BMP    Latest Ref Rng & Units 09/16/2022    3:17 AM  BMP  Glucose 70 - 99 mg/dL 474   BUN 8 - 23 mg/dL 26   Creatinine 2.59 - 1.00 mg/dL 5.63   Sodium 875 - 643 mmol/L 139   Potassium 3.5 - 5.1 mmol/L 4.2   Chloride 98 - 111 mmol/L 107   CO2 22 - 32 mmol/L 19   Calcium 8.9 - 10.3 mg/dL 9.4     Other pertinent labs  TSH 0.560   Imaging/Diagnostic Tests: None  Victorhugo Preis, DO 09/16/2022, 11:53 AM  PGY-1,  Family Medicine FPTS Intern pager: [phone], text pages welcome Secure chat group [facility] Teaching Service

## 2022-09-16 NOTE — Assessment & Plan Note (Addendum)
Patient is rate controlled, on eliquis at home.  - Cardiology plans for DCCV/TEE - Remains NPO - Patient has been rate controlled since arrival to the ED - Continued home Eliquis, pt states she has been taking this regularly - have been holding home metoprolol due to hypotension

## 2022-09-17 DIAGNOSIS — Z7901 Long term (current) use of anticoagulants: Secondary | ICD-10-CM | POA: Diagnosis not present

## 2022-09-17 DIAGNOSIS — I361 Nonrheumatic tricuspid (valve) insufficiency: Secondary | ICD-10-CM | POA: Diagnosis not present

## 2022-09-17 DIAGNOSIS — F32A Depression, unspecified: Secondary | ICD-10-CM | POA: Diagnosis present

## 2022-09-17 DIAGNOSIS — I4891 Unspecified atrial fibrillation: Secondary | ICD-10-CM | POA: Diagnosis not present

## 2022-09-17 DIAGNOSIS — Z79899 Other long term (current) drug therapy: Secondary | ICD-10-CM | POA: Diagnosis not present

## 2022-09-17 DIAGNOSIS — Z8041 Family history of malignant neoplasm of ovary: Secondary | ICD-10-CM | POA: Diagnosis not present

## 2022-09-17 DIAGNOSIS — I110 Hypertensive heart disease with heart failure: Secondary | ICD-10-CM | POA: Diagnosis not present

## 2022-09-17 DIAGNOSIS — N179 Acute kidney failure, unspecified: Secondary | ICD-10-CM | POA: Diagnosis present

## 2022-09-17 DIAGNOSIS — Z8249 Family history of ischemic heart disease and other diseases of the circulatory system: Secondary | ICD-10-CM | POA: Diagnosis not present

## 2022-09-17 DIAGNOSIS — E785 Hyperlipidemia, unspecified: Secondary | ICD-10-CM | POA: Diagnosis present

## 2022-09-17 DIAGNOSIS — I959 Hypotension, unspecified: Secondary | ICD-10-CM | POA: Diagnosis present

## 2022-09-17 DIAGNOSIS — I3481 Nonrheumatic mitral (valve) annulus calcification: Secondary | ICD-10-CM | POA: Diagnosis present

## 2022-09-17 DIAGNOSIS — I272 Pulmonary hypertension, unspecified: Secondary | ICD-10-CM | POA: Diagnosis present

## 2022-09-17 DIAGNOSIS — Z66 Do not resuscitate: Secondary | ICD-10-CM | POA: Diagnosis present

## 2022-09-17 DIAGNOSIS — I130 Hypertensive heart and chronic kidney disease with heart failure and stage 1 through stage 4 chronic kidney disease, or unspecified chronic kidney disease: Secondary | ICD-10-CM | POA: Diagnosis present

## 2022-09-17 DIAGNOSIS — I878 Other specified disorders of veins: Secondary | ICD-10-CM | POA: Diagnosis present

## 2022-09-17 DIAGNOSIS — F419 Anxiety disorder, unspecified: Secondary | ICD-10-CM | POA: Diagnosis present

## 2022-09-17 DIAGNOSIS — M7989 Other specified soft tissue disorders: Secondary | ICD-10-CM | POA: Diagnosis present

## 2022-09-17 DIAGNOSIS — E669 Obesity, unspecified: Secondary | ICD-10-CM | POA: Diagnosis present

## 2022-09-17 DIAGNOSIS — D631 Anemia in chronic kidney disease: Secondary | ICD-10-CM | POA: Diagnosis present

## 2022-09-17 DIAGNOSIS — F17210 Nicotine dependence, cigarettes, uncomplicated: Secondary | ICD-10-CM | POA: Diagnosis present

## 2022-09-17 DIAGNOSIS — I5032 Chronic diastolic (congestive) heart failure: Secondary | ICD-10-CM | POA: Diagnosis not present

## 2022-09-17 DIAGNOSIS — I421 Obstructive hypertrophic cardiomyopathy: Secondary | ICD-10-CM | POA: Diagnosis present

## 2022-09-17 DIAGNOSIS — I340 Nonrheumatic mitral (valve) insufficiency: Secondary | ICD-10-CM | POA: Diagnosis present

## 2022-09-17 DIAGNOSIS — Z825 Family history of asthma and other chronic lower respiratory diseases: Secondary | ICD-10-CM | POA: Diagnosis not present

## 2022-09-17 DIAGNOSIS — I5033 Acute on chronic diastolic (congestive) heart failure: Secondary | ICD-10-CM | POA: Diagnosis present

## 2022-09-17 DIAGNOSIS — I509 Heart failure, unspecified: Secondary | ICD-10-CM | POA: Diagnosis not present

## 2022-09-17 DIAGNOSIS — I4819 Other persistent atrial fibrillation: Secondary | ICD-10-CM | POA: Diagnosis present

## 2022-09-17 DIAGNOSIS — Z87891 Personal history of nicotine dependence: Secondary | ICD-10-CM | POA: Diagnosis not present

## 2022-09-17 DIAGNOSIS — N1831 Chronic kidney disease, stage 3a: Secondary | ICD-10-CM | POA: Diagnosis present

## 2022-09-17 DIAGNOSIS — Z91148 Patient's other noncompliance with medication regimen for other reason: Secondary | ICD-10-CM | POA: Diagnosis not present

## 2022-09-17 LAB — BASIC METABOLIC PANEL
Anion gap: 8 (ref 5–15)
BUN: 23 mg/dL (ref 8–23)
CO2: 19 mmol/L — ABNORMAL LOW (ref 22–32)
Calcium: 9.2 mg/dL (ref 8.9–10.3)
Chloride: 108 mmol/L (ref 98–111)
Creatinine, Ser: 1.37 mg/dL — ABNORMAL HIGH (ref 0.44–1.00)
GFR, Estimated: 42 mL/min — ABNORMAL LOW (ref >60)
Glucose, Bld: 164 mg/dL — ABNORMAL HIGH (ref 70–99)
Potassium: 3.8 mmol/L (ref 3.5–5.1)
Sodium: 135 mmol/L (ref 135–145)

## 2022-09-17 LAB — HEMOGLOBIN AND HEMATOCRIT, BLOOD
HCT: 27.4 % — ABNORMAL LOW (ref 36.0–46.0)
Hemoglobin: 8.4 g/dL — ABNORMAL LOW (ref 12.0–15.0)

## 2022-09-17 MED ORDER — SODIUM CHLORIDE 0.9 % IV SOLN
200.0000 mg | Freq: Once | INTRAVENOUS | Status: AC
  Administered 2022-09-17: 200 mg via INTRAVENOUS
  Filled 2022-09-17: qty 10

## 2022-09-17 MED ORDER — SODIUM CHLORIDE 0.9 % IV SOLN
200.0000 mg | Freq: Once | INTRAVENOUS | Status: DC
  Filled 2022-09-17: qty 10

## 2022-09-17 MED ORDER — ACETAMINOPHEN 325 MG PO TABS
650.0000 mg | ORAL_TABLET | Freq: Four times a day (QID) | ORAL | Status: DC | PRN
  Administered 2022-09-17 – 2022-09-20 (×3): 650 mg via ORAL
  Filled 2022-09-17 (×3): qty 2

## 2022-09-17 NOTE — Progress Notes (Signed)
Spoke with patient.  She is wanting to stay in the hospital until Tuesday to get her procedure done.

## 2022-09-17 NOTE — Plan of Care (Signed)
  Problem: Education: Goal: Knowledge of General Education information will improve Description: Including pain rating scale, medication(s)/side effects and non-pharmacologic comfort measures Outcome: Progressing   Problem: Health Behavior/Discharge Planning: Goal: Ability to manage health-related needs will improve Outcome: Progressing   Problem: Clinical Measurements: Goal: Ability to maintain clinical measurements within normal limits will improve Outcome: Progressing Goal: Diagnostic test results will improve Outcome: Progressing Goal: Cardiovascular complication will be avoided Outcome: Progressing   Problem: Activity: Goal: Risk for activity intolerance will decrease Outcome: Progressing   Problem: Elimination: Goal: Will not experience complications related to bowel motility Outcome: Progressing   Problem: Pain Managment: Goal: General experience of comfort will improve Outcome: Progressing   Problem: Skin Integrity: Goal: Risk for impaired skin integrity will decrease Outcome: Progressing

## 2022-09-17 NOTE — Plan of Care (Signed)
  Problem: Clinical Measurements: Goal: Will remain free from infection Outcome: Progressing Goal: Respiratory complications will improve Outcome: Progressing   Problem: Coping: Goal: Level of anxiety will decrease Outcome: Progressing   Problem: Pain Managment: Goal: General experience of comfort will improve Outcome: Progressing   Problem: Safety: Goal: Ability to remain free from injury will improve Outcome: Progressing   Problem: Skin Integrity: Goal: Risk for impaired skin integrity will decrease Outcome: Progressing

## 2022-09-17 NOTE — Progress Notes (Signed)
Noted patient has no urine output since shift change at 1900H. Bladder scan showed . Denies feeling of a full bladder and tried to assist her going to the restroom to urinate, but patient refuses to ambulate and states "I don't feel like I'm going to pee right now." Patient also reports she just had less than a cup of water and just a can of soda since morning, offered her for some drinks but refuses and states "I don't want to drink water right now." Will report any changes.

## 2022-09-17 NOTE — Assessment & Plan Note (Signed)
Chronic BLE swelling with venous stasis changes. RLE was wrapped in a dressing on exam by wound care this morning - low suspicion for cellulitis, no significant warmth, drainage, afebrile - low suspicion for dvt, pt reliably takes eliquis, no asymmetric leg swelling or calf tenderness - wound care consulted, appreciate assistance with this patient

## 2022-09-17 NOTE — Assessment & Plan Note (Signed)
Patient's creatinine increased to 1.9 from 1.26 on admission yesterday. BMP today pending. Appears her baseline is a little over 1. -Baseline GFR of 51-52 -Continue to hold additional Lasix diuresis -Caution with nephrotoxic agents

## 2022-09-17 NOTE — Assessment & Plan Note (Addendum)
Patient has been anxious since arrival, due to multiple recent hospital admissions and some stressors in her personal life.  Patient is prescribed hydroxyzine 10 mg 3 times daily at home as needed for anxiety -Discontinued hydroxyzine due to sleepiness -Started Lexapro 5 mg daily, believes she is tolerating well -Consult to spiritual care placed at patient's request

## 2022-09-17 NOTE — Progress Notes (Signed)
   Patient called the answering service today upset that her TEE/DCCV was rescheduled. She is frustrated that her procedure was postponed and she has now been in the hospital for longer than expected. She wanted to talk to Dr. Servando Salina and get her opinion about staying for the TEE/DCCV on Tuesday vs leaving with plans to have procedure arranged as an outpatient. I informed her that Dr. Servando Salina is not available at this time. Patient had been seen by Dr. Wyline Mood this AM, and per her note, Dr. Wyline Mood felt patient would be OK to go home with outpatient TEE/DCCV. I explained that if patient wanted to go home, it sounded reasonable from a cardiac standpoint, but she could also stay and have the procedure done this admission.   Ultimately, patient decided to stay until Tuesday to have TEE/DCCV completed  Christine Albee, PA-C 09/17/2022 1:02 PM

## 2022-09-17 NOTE — Assessment & Plan Note (Signed)
Patient is rate controlled, on eliquis at home.  - Cardiology plans for DCCV/TEE Monday d/t low hemoglobin - Heart healthy diet until midnight tomorrow - Patient has been rate controlled since arrival to the ED - Continued home Eliquis, pt states she has been taking this regularly - have been holding home metoprolol due to hypotension

## 2022-09-17 NOTE — Progress Notes (Addendum)
Patient Name: Christine Jacobson Date of Encounter: 09/17/2022 [facility] Cardiologist: Thomasene Ripple, DO   Interval Summary  .    Patient admitted two days ago for acute on chronic HFpEF exacerbation and found to be in atrial fibrillation again. She is s/p IV diuretics. She is upset and wants to leave. Also demanding to speak to Dr. Servando Salina  Afib rates are controlled  Vital Signs .    Vitals:   09/16/22 2315 09/17/22 0500 09/17/22 0529 09/17/22 0740  BP: 106/72  103/66 109/71  Pulse: 79  (!) 117 77  Resp: 17 17  15   Temp: 98.5 F (36.9 C) 98.4 F (36.9 C) 98.4 F (36.9 C) 97.8 F (36.6 C)  TempSrc: Oral Oral Oral Oral  SpO2: 98%  100% 99%  Weight:   88.8 kg   Height:        Intake/Output Summary (Last 24 hours) at 09/17/2022 1117 Last data filed at 09/16/2022 2230 Gross per 24 hour  Intake 237 ml  Output --  Net 237 ml      09/17/2022    5:29 AM 09/16/2022    4:03 AM 09/15/2022   11:24 PM  Last 3 Weights  Weight (lbs) 195 lb 12.3 oz 196 lb 10.4 oz 200 lb 6.4 oz  Weight (kg) 88.8 kg 89.2 kg 90.9 kg      Telemetry/ECG    Afib 70-90s - Personally Reviewed  Physical Exam .   GEN: No acute distress.   Neck: No JVD Cardiac: RRR, harsh systolic murmur at the sternal border to the apex Respiratory: Clear to auscultation bilaterally. GI: Soft, nontender, non-distended  MS: LE edema bilatterally. Has wrap on R leg. L left looks flaky and erythematous and warm to the touch  Patient Profile    Christine Jacobson is a 67 y.o. female has hx of  obesity, hypertrophic cardiomyopathy with gradient of 64 mmHg that increases with valsalva, chronic HFpEF, PAF s/p prior DCCV 01/2022, and hyperlipidemia  and admitted on 09/15/2022 for the evaluation of CHF and recurrent AFIB.  She has recent admission in July of this year for anemia and had heart failure exacerbation.  Assessment & Plan .      Atrial fibrillation status post cardioversion 01/2022 Prior to episode, EKG in July  showed normal sinus rhythm.  There is concern for compliancy with her Eliquis.  She states she has been taking however is not taking and unfamiliar with some of her other medications.  Currently is rate controlled with heart rates in the 70s to 90s not on any beta-blocker likely due to hypotension requiring midodrine.  Given rate control and sudden drop in hemoglobin.  Performing MD for TEE/DCCV would like to allow the hemoglobin to stabilize before continuing.  Will need to plan for Monday then.  Orders will need to be placed on Sunday. Continue Eliquis 5 mg twice daily.  PTA Toprol XL 100mg .  TEE/DCCV was delayed 2/2 low hgb. She had w/u with GI with no active bleeding. Has iron deficiency. Her rates are controlled and she is asymptomatic. I don't see the need to keep her here until Tue (Monday is a holiday) for a TEE/DCCV. I told her this.  HOCM mean gradient 64 with valsalva Moderate pulmonary HTN Echocardiogram 09/02/2021 showed EF 60 to 65%.  Severely dilated LA, moderately dilated RA.  RVSP 45.  Reported outpatient she was not taking her diuretic due to issues with incontinence.  BNP 1600, no signs of pulmonary congestion on imaging or  physical exam.  She was given IV Lasix 40 mg yesterday however I's and O's have not been reported.  She has peripheral edema however this is difficult to determine whether this is more due to cellulitis versus her heart failure.  She does state that she had improvement in symptoms with this however still seems to be short of breath.  GDMT currently restricted by hypotension and AKI.  Creatinine 1.9.  Ideally would be on SGLT2 inhibitor if no contraindications. Repeat echo mean gradient of 75 mmHg, EF 65-70%, LVOT obstruction with mean gradient of 75 mmhg RV low normal, severely dilated LA; moderate to severe MR With elevated gradients don't want to over diurese  Leg swelling versus cellulitis Left leg is considerably warm, erythematous, flaking.  Has previous history  of cellulitis.  Wound care consult pending.  AKI on CKD Renal function on admission 1.26.  Currently 1.9 after IV lasix 40mg  yesterday.  Baseline appears to be around 1-1.2.  Hyperlipidemia LDL 146 3 months ago.  Increase atorvastatin to 80 mg.  Anemia Recently had admission that showed gastritis, hemorrhoids, diverticulosis.  Informed Consent   Shared Decision Making/Informed Consent{  The risks [stroke, cardiac arrhythmias rarely resulting in the need for a temporary or permanent pacemaker, skin irritation or burns, esophageal damage, perforation (1:10,000 risk), bleeding, pharyngeal hematoma as well as other potential complications associated with conscious sedation including aspiration, arrhythmia, respiratory failure and death], benefits (treatment guidance, restoration of normal sinus rhythm, diagnostic support) and alternatives of a transesophageal echocardiogram guided cardioversion were discussed in detail with Ms. Diggs and she is willing to proceed.      From a cardiac perspective, it is ok if she would like to go home and FU. She is on oral medications and asymptomatic. Consider HOCM clinic in the future.   For questions or updates, please contact [facility] Please consult [email or web address] for contact info under        Signed, [name], Alben Spittle, MD   Maisie Fus MD

## 2022-09-17 NOTE — TOC Progression Note (Addendum)
Transition of Care [facility]) - Progression Note    Patient Details  Name: Christine Jacobson MRN: [id number] Date of Birth: 01-22-1955  Transition of Care [facility]) CM/SW Contact  Ronny Bacon, RN Phone Number: 09/17/2022, 8:30 AM  Clinical Narrative:  Message received from [facility]end [facility] RNCM regarding Delila Spence seeing that patient was active with [facility] recently and that he received a referral for patient. Spoke with patient by phone, patient could not remember who she had for [facility] recently but did not have a preference on which [facility] agency she uses at discharge. [facility] made aware and feels since patient was with [facility] recently she can stay with them. Messge send to Cheryl[facility] to confirm acceptance.  1415 Elnita Maxwell- [facility] reports unable to accept patient at this time due to no RN coverage available for patient's needs. Message sent to Delila Spence to see if able to accept patient for [facility] RN services.  [facility] able to accept patient for [facility] RN.    Expected Discharge Plan: Home w Home Health Services Barriers to Discharge: Continued Medical Work up  Expected Discharge Plan and Services In-house Referral: NA Discharge Planning Services: CM Consult Post Acute Care Choice: Home Health Living arrangements for the past 2 months: Single Family Home                   DME Agency: NA       HH Arranged: RN, Disease Management HH Agency: [facility] Date [facility] Agency Contacted: 09/16/22 Time HH Agency Contacted: 1626 Representative spoke with at [facility] Agency: Kandee Keen   Social Determinants of Health (SDOH) Interventions SDOH Screenings   Food Insecurity: No Food Insecurity (09/15/2022)  Housing: Low Risk  (09/15/2022)  Transportation Needs: No Transportation Needs (09/15/2022)  Utilities: Not At Risk (09/15/2022)  Depression (PHQ2-9): Low Risk  (10/26/2021)  Financial Resource Strain: Low Risk  (12/03/2021)  Tobacco Use: Medium Risk (09/15/2022)    Readmission Risk  Interventions    08/07/2022   11:10 AM 07/14/2022    2:40 PM  Readmission Risk Prevention Plan  Post Dischage Appt  Complete  Medication Screening  Complete  Transportation Screening Complete   PCP or Specialist Appt within 5-7 Days Complete   Home Care Screening Complete   Medication Review (RN CM) Complete

## 2022-09-17 NOTE — Progress Notes (Signed)
Daily Progress Note Intern Pager: [phone]  Patient name: Christine Jacobson Medical record number: [id number] Date of birth: November 21, 1955 Age: 67 y.o. Gender: female  Primary Care Provider: Karenann Cai, NP Consultants: Cardiology Code Status: DNR  Pt Overview and Major Events to Date:  8/29 - admitted   Assessment and Plan:  67 year old female with past medical history of HOCM, PAF, diastolic CHF, hypertension who presented initially from her cardiologist office due to dyspnea thought to be secondary to fluid overload.   Patient initially to get DCCV.  Due to her low hemoglobin, proceduralist wanted to wait until Monday to perform this to make sure that the Hgb does not continue to drop significantly.  Will plan to get repeat CBC tomorrow night. Assessment & Plan Acute exacerbation of CHF (congestive heart failure) (HCC) Suspect this could be contributing to her dyspnea.  Could be due to inadequate control of afib. Patient is rate controlled, but per cardiology could benefit from rhythm control as well likely due to HOCM. CXR without pleural effusions, but does have cardiomegaly. Does not appear profusely volume overloaded on exam and BNP elevated though not her max. Additionally has chronic bilateral lower extremity edema. -Given Lasix 40 mg in the ED. Additional Lasix has been held since admission due to hypotension and pt does have a AKI -Strict I&Os -suspect patient has not been drinking much, and has not been diuresed since initial dose in the ED.  Patient's RN last night noted that patient has not had any documented output since her shift change.  She performed a bladder scan with 193 mL.  Patient did not want to go to the bathroom because she did not have to go, and did not want to drink anything. -home Midodrine 5mg  BID -Cardiology following, appreciate recs -Daily weights -TSH 0.560 WNL Atrial fibrillation by electrocardiogram [facility]) Patient is rate controlled, on eliquis  at home.  - Cardiology plans for DCCV/TEE Monday d/t low hemoglobin - Heart healthy diet until midnight tomorrow - Patient has been rate controlled since arrival to the ED - Continued home Eliquis, pt states she has been taking this regularly - have been holding home metoprolol due to hypotension Normocytic anemia Patient with hemoglobin 8.3 yesterday, decreased from 9.8 on admission.  Does appear that patient's baseline hemoglobin has been in the 7s since June.  Patient required blood transfusions during her last hospital admission, however had a colonoscopy and EGD at that time which showed no source of bleeding. -Patient is asymptomatic -CBC 9/1 a.m. to monitor trend before DCCV AKI (acute kidney injury) (HCC) Patient's creatinine increased to 1.9 from 1.26 on admission yesterday. BMP today pending. Appears her baseline is a little over 1. -Baseline GFR of 51-52 -Continue to hold additional Lasix diuresis -Caution with nephrotoxic agents Anxiety Patient has been anxious since arrival, due to multiple recent hospital admissions and some stressors in her personal life.  Patient is prescribed hydroxyzine 10 mg 3 times daily at home as needed for anxiety -Discontinued hydroxyzine due to sleepiness -Started Lexapro 5 mg daily, believes she is tolerating well -Consult to spiritual care placed at patient's request Leg swelling Chronic BLE swelling with venous stasis changes. RLE was wrapped in a dressing on exam by wound care this morning - low suspicion for cellulitis, no significant warmth, drainage, afebrile - low suspicion for dvt, pt reliably takes eliquis, no asymmetric leg swelling or calf tenderness - wound care consulted, appreciate assistance with this patient  Chronic and Stable Problems:  HTN  HLD - increased atorvastatin to 80mg  per cards   FEN/GI: heart healthy PPx: eliquis Dispo:Home in 2-3 days after cardioversion.   Subjective:  No acute events overnight.  Patient  frustrated with being woken up so many times while in the hospital.  She states that she has been more sleepy.  I suspect this is due to the Atarax that we gave her for anxiety and itching.  Will plan to discontinue that today and see if it helps.  Objective: Temp:  [97.8 F (36.6 C)-98.5 F (36.9 C)] 97.8 F (36.6 C) (08/31 0740) Pulse Rate:  [77-117] 77 (08/31 0740) Resp:  [15-17] 15 (08/31 0740) BP: (100-109)/(66-74) 109/71 (08/31 0740) SpO2:  [98 %-100 %] 99 % (08/31 0740) Weight:  [88.8 kg] 88.8 kg (08/31 0529) Physical Exam: General: No acute distress, resting Cardiovascular: Patient in A-fib. Regular rate. Systolic murmur  Respiratory: CTAB, no increased work of breathing Extremities: RLE wrapped.  Laboratory: Most recent CBC Lab Results  Component Value Date   WBC 10.3 09/16/2022   HGB 8.3 (L) 09/16/2022   HCT 27.0 (L) 09/16/2022   MCV 81.8 09/16/2022   PLT 270 09/16/2022   Most recent BMP    Latest Ref Rng & Units 09/16/2022    3:17 AM  BMP  Glucose 70 - 99 mg/dL 098   BUN 8 - 23 mg/dL 26   Creatinine 1.19 - 1.00 mg/dL 1.47   Sodium 829 - 562 mmol/L 139   Potassium 3.5 - 5.1 mmol/L 4.2   Chloride 98 - 111 mmol/L 107   CO2 22 - 32 mmol/L 19   Calcium 8.9 - 10.3 mg/dL 9.4    Imaging/Diagnostic Tests: Echo IMPRESSION: 1. The basal septum measures 2.0 cm. Findings suggest HOCM with 75 mmHg continuous mean gradient - valsalva not performed. Left ventricular ejection fraction, by estimation, is 65 to 70% . Left ventricular ejection fraction by PLAX is 65 % . The left ventricle has normal function. The left ventricle has no regional wall motion abnormalities. There is severe asymmetric left ventricular hypertrophy of the basal- septal segment. Left ventricular diastolic function could not be evaluated.  2. Right ventricular systolic function is low normal. The right ventricular size is normal. There is severely elevated pulmonary artery systolic pressure. The estimated  right ventricular systolic pressure is 64. 8 mmHg. 3. Left atrial size was massively dilated.  4. Right atrial size was mildly dilated.  5. SAM is noted. The mitral valve is degenerative. Moderate to severe mitral valve regurgitation. Mild mitral stenosis. Severe mitral annular calcification.  6. Tricuspid valve regurgitation is moderate.  7. The aortic valve is tricuspid. Aortic valve regurgitation is not visualized. Aortic valve sclerosis/ calcification is present, without any evidence of aortic stenosis.  8. The inferior vena cava is dilated in size with < 50% respiratory variability, suggesting right atrial pressure of 15 mmHg.   Lizbett Garciagarcia, DO 09/17/2022, 11:21 AM  PGY-1, [facility] FPTS Intern pager: [phone], text pages welcome Secure chat group [facility] Teaching Service

## 2022-09-17 NOTE — Assessment & Plan Note (Addendum)
Suspect this could be contributing to her dyspnea.  Could be due to inadequate control of afib. Patient is rate controlled, but per cardiology could benefit from rhythm control as well likely due to HOCM. CXR without pleural effusions, but does have cardiomegaly. Does not appear profusely volume overloaded on exam and BNP elevated though not her max. Additionally has chronic bilateral lower extremity edema. -Given Lasix 40 mg in the ED. Additional Lasix has been held since admission due to hypotension and pt does have a AKI -Strict I&Os -suspect patient has not been drinking much, and has not been diuresed since initial dose in the ED.  Patient's RN last night noted that patient has not had any documented output since her shift change.  She performed a bladder scan with 193 mL.  Patient did not want to go to the bathroom because she did not have to go, and did not want to drink anything. -home Midodrine 5mg  BID -Cardiology following, appreciate recs -Daily weights -TSH 0.560 WNL

## 2022-09-17 NOTE — Assessment & Plan Note (Signed)
Patient with hemoglobin 8.3 yesterday, decreased from 9.8 on admission.  Does appear that patient's baseline hemoglobin has been in the 7s since June.  Patient required blood transfusions during her last hospital admission, however had a colonoscopy and EGD at that time which showed no source of bleeding. -Patient is asymptomatic -CBC 9/1 a.m. to monitor trend before DCCV

## 2022-09-18 DIAGNOSIS — I421 Obstructive hypertrophic cardiomyopathy: Secondary | ICD-10-CM | POA: Diagnosis not present

## 2022-09-18 DIAGNOSIS — I4891 Unspecified atrial fibrillation: Secondary | ICD-10-CM | POA: Diagnosis not present

## 2022-09-18 LAB — CBC
HCT: 29.4 % — ABNORMAL LOW (ref 36.0–46.0)
Hemoglobin: 9.1 g/dL — ABNORMAL LOW (ref 12.0–15.0)
MCH: 25.9 pg — ABNORMAL LOW (ref 26.0–34.0)
MCHC: 31 g/dL (ref 30.0–36.0)
MCV: 83.5 fL (ref 80.0–100.0)
Platelets: 282 10*3/uL (ref 150–400)
RBC: 3.52 MIL/uL — ABNORMAL LOW (ref 3.87–5.11)
RDW: 18.2 % — ABNORMAL HIGH (ref 11.5–15.5)
WBC: 10.1 10*3/uL (ref 4.0–10.5)
nRBC: 0 % (ref 0.0–0.2)

## 2022-09-18 MED ORDER — HYDROXYZINE HCL 10 MG PO TABS
10.0000 mg | ORAL_TABLET | Freq: Once | ORAL | Status: AC
  Administered 2022-09-18: 10 mg via ORAL
  Filled 2022-09-18: qty 1

## 2022-09-18 NOTE — Assessment & Plan Note (Addendum)
Hgb 8.4 yesterday from 9.8 on admission.  Baseline hgb appears to be around 7.  Patient required blood transfusions during her last hospital admission, however had a colonoscopy and EGD at that time which showed diverticulosis, internal hemorrhoids, and tubular adenomatous polyp but no source of bleeding. -s/p IV iron infusion -monitor CBC -transfusion threshold <7

## 2022-09-18 NOTE — Plan of Care (Signed)
Rates are currently controlled. Patient wishes to stay for TEE/DCCV that she was planned for prior. Will ensure she is on the list. Otherwise, with no changes can follow peripherally until then.

## 2022-09-18 NOTE — Assessment & Plan Note (Signed)
Patient is rate controlled, on eliquis at home. S/p cardioversion in 01/2022. - Appreciate cardiology, plan for TEE/DCCV on Tues 9/3 - Heart healthy diet, NPO 9/3 midnight - Patient has been rate controlled since arrival to the ED - Continued home Eliquis, pt states she has been taking this regularly - have been holding home metoprolol due to hypotension

## 2022-09-18 NOTE — Assessment & Plan Note (Signed)
Chronic BLE swelling with venous stasis changes. - low suspicion for cellulitis, no significant warmth, drainage, afebrile - low suspicion for dvt, pt reliably takes eliquis, no asymmetric leg swelling or calf tenderness - wound care consulted, appreciate assistance with this patient - unna boot

## 2022-09-18 NOTE — Assessment & Plan Note (Addendum)
Creatinine improved to 1.37 yesterday.  Appears her baseline is a little over 1. -Continue to hold additional Lasix diuresis -Caution with nephrotoxic agents

## 2022-09-18 NOTE — Progress Notes (Signed)
Daily Progress Note Intern Pager: [phone]  Patient name: Christine Jacobson Medical record number: [id number] Date of birth: 14-Jan-1956 Age: 67 y.o. Gender: female  Primary Care Provider: Karenann Cai, NP Consultants: Cardiology Code Status: DNR   Pt Overview and Major Events to Date:  8/29 - admitted    Assessment and Plan:   67 year old female with past medical history of HOCM, PAF, diastolic CHF, hypertension admitted for acute on chronic HFpEF and recurrent afib. Plan for DCCV on Tues 9/3.  Assessment & Plan Atrial fibrillation by electrocardiogram [facility]) Patient is rate controlled, on eliquis at home. S/p cardioversion in 01/2022. - Appreciate cardiology, plan for TEE/DCCV on Tues 9/3 - Heart healthy diet, NPO 9/3 midnight - Patient has been rate controlled since arrival to the ED - Continued home Eliquis, pt states she has been taking this regularly - have been holding home metoprolol due to hypotension HOCM (hypertrophic obstructive cardiomyopathy) (HCC) In the setting of HFpEF. Now euvolemic on exam after initial dose of lasix. BP slightly soft and Cr elevated, hesitant with further diuresis. Echo with EF 65-70%, severe pHTN, MR. -continue Midodrine 5mg  BID -Cardiology following, appreciate recs -Consider SGLT2 as renal function improves -Daily weights, strict I/O -TSH 0.560 WNL Normocytic anemia Hgb 8.4 yesterday from 9.8 on admission.  Baseline hgb appears to be around 7.  Patient required blood transfusions during her last hospital admission, however had a colonoscopy and EGD at that time which showed diverticulosis, internal hemorrhoids, and tubular adenomatous polyp but no source of bleeding. -s/p IV iron infusion -monitor CBC -transfusion threshold <7 AKI (acute kidney injury) (HCC) Creatinine improved to 1.37 yesterday.  Appears her baseline is a little over 1. -Continue to hold additional Lasix diuresis -Caution with nephrotoxic agents Anxiety Patient  has been anxious since arrival, due to multiple recent hospital admissions and some stressors in her personal life.  Patient is prescribed hydroxyzine 10 mg 3 times daily at home as needed for anxiety -Discontinued hydroxyzine due to sleepiness -Started Lexapro 5 mg daily -Consult to spiritual care placed at patient's request Leg swelling Chronic BLE swelling with venous stasis changes. - low suspicion for cellulitis, no significant warmth, drainage, afebrile - low suspicion for dvt, pt reliably takes eliquis, no asymmetric leg swelling or calf tenderness - wound care consulted, appreciate assistance with this patient - unna boot     Chronic and Stable Problems:  HLD - increased atorvastatin to 80mg  per cards   FEN/GI: heart healthy PPx: eliquis Dispo:Home in 2-3 days after cardioversion.   Subjective:  NAEON, no concerns this morning, still wants to wait until Tuesday for her procedure  Objective: Temp:  [97.6 F (36.4 C)-98.4 F (36.9 C)] 97.8 F (36.6 C) (09/01 0015) Pulse Rate:  [77-117] 90 (08/31 2040) Resp:  [15-18] 18 (09/01 0015) BP: (88-113)/(63-82) 113/82 (09/01 0015) SpO2:  [97 %-100 %] 100 % (09/01 0015) Weight:  [88.8 kg] 88.8 kg (08/31 0529) Physical Exam: General: NAD, appears anxious Cardiovascular: RRR, systolic murmur appreciated Respiratory: CTAB normal WOB on RA Abdomen: Soft NT/ND Extremities: RLE wrapped, mild lower extremity edema bilaterally stable from prior   Laboratory: Most recent CBC Lab Results  Component Value Date   WBC 10.3 09/16/2022   HGB 8.4 (L) 09/17/2022   HCT 27.4 (L) 09/17/2022   MCV 81.8 09/16/2022   PLT 270 09/16/2022   Most recent BMP    Latest Ref Rng & Units 09/17/2022   10:54 AM  BMP  Glucose 70 - 99 mg/dL  164   BUN 8 - 23 mg/dL 23   Creatinine 8.11 - 1.00 mg/dL 9.14   Sodium 782 - 956 mmol/L 135   Potassium 3.5 - 5.1 mmol/L 3.8   Chloride 98 - 111 mmol/L 108   CO2 22 - 32 mmol/L 19   Calcium 8.9 - 10.3 mg/dL  9.2     Vonna Drafts, MD 09/18/2022, 1:12 AM  PGY-2, [facility] FPTS Intern pager: [phone], text pages welcome Secure chat group [facility] Teaching Service

## 2022-09-18 NOTE — Assessment & Plan Note (Addendum)
In the setting of HFpEF. Now euvolemic on exam after initial dose of lasix. BP slightly soft and Cr elevated, hesitant with further diuresis. Echo with EF 65-70%, severe pHTN, MR. -continue Midodrine 5mg  BID -Cardiology following, appreciate recs -Consider SGLT2 as renal function improves -Daily weights, strict I/O -TSH 0.560 WNL

## 2022-09-18 NOTE — Assessment & Plan Note (Signed)
Patient has been anxious since arrival, due to multiple recent hospital admissions and some stressors in her personal life.  Patient is prescribed hydroxyzine 10 mg 3 times daily at home as needed for anxiety -Discontinued hydroxyzine due to sleepiness -Started Lexapro 5 mg daily -Consult to spiritual care placed at patient's request

## 2022-09-19 DIAGNOSIS — I4891 Unspecified atrial fibrillation: Secondary | ICD-10-CM | POA: Diagnosis not present

## 2022-09-19 LAB — CBC
HCT: 27.1 % — ABNORMAL LOW (ref 36.0–46.0)
Hemoglobin: 8.3 g/dL — ABNORMAL LOW (ref 12.0–15.0)
MCH: 25.4 pg — ABNORMAL LOW (ref 26.0–34.0)
MCHC: 30.6 g/dL (ref 30.0–36.0)
MCV: 82.9 fL (ref 80.0–100.0)
Platelets: 282 10*3/uL (ref 150–400)
RBC: 3.27 MIL/uL — ABNORMAL LOW (ref 3.87–5.11)
RDW: 18.3 % — ABNORMAL HIGH (ref 11.5–15.5)
WBC: 9.1 10*3/uL (ref 4.0–10.5)
nRBC: 0 % (ref 0.0–0.2)

## 2022-09-19 MED ORDER — HYDROXYZINE HCL 10 MG PO TABS
10.0000 mg | ORAL_TABLET | Freq: Every day | ORAL | Status: DC
  Administered 2022-09-19 – 2022-09-20 (×2): 10 mg via ORAL
  Filled 2022-09-19 (×3): qty 1

## 2022-09-19 MED ORDER — SODIUM CHLORIDE 0.9 % IV SOLN: INTRAVENOUS | Status: DC

## 2022-09-19 NOTE — Progress Notes (Signed)
Daily Progress Note Intern Pager: [phone]  Patient name: Christine Jacobson Medical record number: [id number] Date of birth: April 23, 1955 Age: 67 y.o. Gender: female  Primary Care Provider: Karenann Cai, NP Consultants: cardiology Code Status: DNR  Pt Overview and Major Events to Date:  8/29 - admitted  Assessment and Plan:  67 year old female with past medical history of HOCM, PAF, diastolic CHF, hypertension admitted for acute on chronic HFpEF and recurrent afib. Awaiting TEE/DCCV on Tues 9/3.  Assessment & Plan Atrial fibrillation by electrocardiogram [facility]) Did have tachycardia 100-126 throughout the night, now rate controlled at 92. Notably pt was feeling very anxious last night and did not sleep much.  - Appreciate cardiology, plan for TEE/DCCV on Tues 9/3 - Heart healthy diet, NPO 9/3 midnight - Continued home Eliquis, pt states she has been taking this regularly - have been holding home metoprolol due to hypotension, consider adding back if continues to be tachycardic HOCM (hypertrophic obstructive cardiomyopathy) (HCC) In the setting of HFpEF. Now euvolemic on exam after initial dose of lasix. BP slightly soft and Cr elevated, hesitant with further diuresis. Echo with EF 65-70%, severe pHTN, MR. -continue Midodrine 5mg  BID -Cardiology following, appreciate recs -Consider SGLT2 as renal function improves -Daily weights, strict I/O with appropriate output -TSH 0.560 WNL Acute exacerbation of CHF (congestive heart failure) (HCC) Suspect this could be contributing to her dyspnea.  Could be due to inadequate control of afib. Patient is rate controlled, but per cardiology could benefit from rhythm control as well likely due to HOCM. Initial CXR without pleural effusions, but does have cardiomegaly. Does not appear profusely volume overloaded on exam and BNP elevated though not her max. Additionally has chronic bilateral lower extremity edema. -Given Lasix 40 mg in the ED.  Additional Lasix has been held since admission due to hypotension and pt does have a AKI -home Midodrine 5mg  BID -Cardiology following, appreciate recs -Daily weights -TSH 0.560 WNL Normocytic anemia Hgb 8.4 yesterday from 9.8 on admission.  Baseline hgb appears to be around 7.  Patient required blood transfusions during her last hospital admission, however had a colonoscopy and EGD at that time which showed diverticulosis, internal hemorrhoids, and tubular adenomatous polyp but no source of bleeding. -s/p IV iron infusion -monitor CBC, plan to repeat in am prior to DCCV -transfusion threshold <7 AKI (acute kidney injury) (HCC) Creatinine improved to 1.37 8/31.  Appears her baseline is a little over 1. -Continue to hold additional Lasix diuresis -Caution with nephrotoxic agents -BMP am Anxiety Patient has been anxious since arrival, due to multiple recent hospital admissions and some stressors in her personal life.  Patient is prescribed hydroxyzine 10 mg 3 times daily at home as needed for anxiety -Discontinued hydroxyzine during the day due to sleepiness, will add back to bedtime regimen -Started Lexapro 5 mg daily -Consult to spiritual care placed at patient's request Leg swelling Chronic BLE swelling with venous stasis changes. - low suspicion for cellulitis, no significant warmth, drainage, afebrile - low suspicion for dvt, pt reliably takes eliquis, no asymmetric leg swelling or calf tenderness - wound care consulted, appreciate assistance with this patient - unna boot to be replaced today  Chronic and Stable Problems:  HLD - increased atorvastatin to 80mg  per cards   FEN/GI: heart healthy PPx: eliquis Dispo:Home in 2-3 days after DCCV  Subjective:  Patient doing well.  States she is tired, could not sleep last night.  She requested a dose of her Atarax last night for  anxiety and to help her sleep, states it helped.  Objective: Temp:  [98 F (36.7 C)-98.8 F (37.1 C)] 98  F (36.7 C) (09/02 1111) Pulse Rate:  [52-93] 92 (09/02 1111) Resp:  [15-17] 15 (09/02 1111) BP: (106-132)/(68-91) 128/91 (09/02 1111) SpO2:  [90 %-100 %] 100 % (09/02 1111) Weight:  [89.8 kg] 89.8 kg (09/02 0455) Physical Exam: General: No acute distress, resting comfortably Cardiovascular: Tachycardic at 102, A-fib, systolic murmur unchanged from previous Respiratory: Clear to auscultation bilaterally, no increased work of breathing Abdomen: Soft, nontender Extremities: BLE with unna boots in place  Laboratory: Most recent CBC Lab Results  Component Value Date   WBC 9.1 09/19/2022   HGB 8.3 (L) 09/19/2022   HCT 27.1 (L) 09/19/2022   MCV 82.9 09/19/2022   PLT 282 09/19/2022   Most recent BMP    Latest Ref Rng & Units 09/17/2022   10:54 AM  BMP  Glucose 70 - 99 mg/dL 161   BUN 8 - 23 mg/dL 23   Creatinine 0.96 - 1.00 mg/dL 0.45   Sodium 409 - 811 mmol/L 135   Potassium 3.5 - 5.1 mmol/L 3.8   Chloride 98 - 111 mmol/L 108   CO2 22 - 32 mmol/L 19   Calcium 8.9 - 10.3 mg/dL 9.2     Imaging/Diagnostic Tests: None  Chriss Mannan, DO 09/19/2022, 11:55 AM  PGY-1, [place] [facility] FPTS Intern pager: [phone], text pages welcome Secure chat group [facility] Teaching Service

## 2022-09-19 NOTE — Assessment & Plan Note (Addendum)
In the setting of HFpEF. Now euvolemic on exam after initial dose of lasix. BP slightly soft and Cr elevated, hesitant with further diuresis. Echo with EF 65-70%, severe pHTN, MR. -continue Midodrine 5mg  BID -Cardiology following, appreciate recs -Consider SGLT2 as renal function improves -Daily weights, strict I/O with appropriate output -TSH 0.560 WNL

## 2022-09-19 NOTE — Assessment & Plan Note (Addendum)
Hgb 8.4 yesterday from 9.8 on admission.  Baseline hgb appears to be around 7.  Patient required blood transfusions during her last hospital admission, however had a colonoscopy and EGD at that time which showed diverticulosis, internal hemorrhoids, and tubular adenomatous polyp but no source of bleeding. -s/p IV iron infusion -monitor CBC, plan to repeat in am prior to DCCV -transfusion threshold <7

## 2022-09-19 NOTE — Assessment & Plan Note (Addendum)
Did have tachycardia 100-126 throughout the night, now rate controlled at 92. Notably pt was feeling very anxious last night and did not sleep much.  - Appreciate cardiology, plan for TEE/DCCV on Tues 9/3 - Heart healthy diet, NPO 9/3 midnight - Continued home Eliquis, pt states she has been taking this regularly - have been holding home metoprolol due to hypotension, consider adding back if continues to be tachycardic

## 2022-09-19 NOTE — Assessment & Plan Note (Addendum)
Creatinine improved to 1.37 8/31.  Appears her baseline is a little over 1. -Continue to hold additional Lasix diuresis -Caution with nephrotoxic agents -BMP am

## 2022-09-19 NOTE — Assessment & Plan Note (Addendum)
Patient has been anxious since arrival, due to multiple recent hospital admissions and some stressors in her personal life.  Patient is prescribed hydroxyzine 10 mg 3 times daily at home as needed for anxiety -Discontinued hydroxyzine during the day due to sleepiness, will add back to bedtime regimen -Started Lexapro 5 mg daily -Consult to spiritual care placed at patient's request

## 2022-09-19 NOTE — Assessment & Plan Note (Addendum)
Chronic BLE swelling with venous stasis changes. - low suspicion for cellulitis, no significant warmth, drainage, afebrile - low suspicion for dvt, pt reliably takes eliquis, no asymmetric leg swelling or calf tenderness - wound care consulted, appreciate assistance with this patient - unna boot to be replaced today

## 2022-09-19 NOTE — Progress Notes (Signed)
Progress Note  Patient Name: Christine Jacobson Date of Encounter: 09/19/2022  Primary Cardiologist: Thomasene Ripple, DO   Subjective   Patient seen examined her bedside.  She is lying bed when I arrived.  Shortness of breath has improved.  Inpatient Medications    Scheduled Meds:  apixaban  5 mg Oral BID   atorvastatin  80 mg Oral Daily   escitalopram  5 mg Oral Daily   hydrOXYzine  10 mg Oral QHS   midodrine  5 mg Oral BID   pantoprazole  40 mg Oral Daily   Continuous Infusions:  sodium chloride     PRN Meds: acetaminophen   Vital Signs    Vitals:   09/18/22 1500 09/18/22 2000 09/19/22 0455 09/19/22 0817  BP: 106/68 116/76 112/72 132/72  Pulse: 93 (!) 52 85 85  Resp: 16 17 16 16   Temp: 98.8 F (37.1 C) 98.5 F (36.9 C) 98.5 F (36.9 C) 98 F (36.7 C)  TempSrc: Oral Oral Oral Oral  SpO2: 99% 90% 94%   Weight:   89.8 kg   Height:        Intake/Output Summary (Last 24 hours) at 09/19/2022 0937 Last data filed at 09/18/2022 1700 Gross per 24 hour  Intake 234 ml  Output --  Net 234 ml   Filed Weights   09/17/22 0529 09/18/22 0431 09/19/22 0455  Weight: 88.8 kg 90.1 kg 89.8 kg    Telemetry    Atrial fibrillation with controlled ventricular rate- Personally Reviewed  ECG    None today  - Personally Reviewed  Physical Exam   General: Comfortable Head: Atraumatic, normal size  Eyes: PEERLA, EOMI  Neck: Supple, normal JVD Cardiac: Normal S1, S2; RRR; no murmurs, rubs, or gallops Lungs: Clear to auscultation bilaterally Abd: Soft, nontender, no hepatomegaly  Ext: warm, no edema Musculoskeletal: No deformities, BUE and BLE strength normal and equal Skin: Warm and dry, no rashes   Neuro: Alert and oriented to person, place, time, and situation, CNII-XII grossly intact, no focal deficits  Psych: Normal mood and affect   Labs    Chemistry Recent Labs  Lab 09/15/22 1307 09/16/22 0317 09/17/22 1054  NA 135 139 135  K 4.6 4.2 3.8  CL 108 107 108  CO2  16* 19* 19*  GLUCOSE 117* 141* 164*  BUN 18 26* 23  CREATININE 1.26* 1.90* 1.37*  CALCIUM 9.8 9.4 9.2  GFRNONAA 46* 28* 42*  ANIONGAP 11 13 8      Hematology Recent Labs  Lab 09/16/22 0317 09/16/22 1139 09/17/22 1054 09/18/22 1215 09/19/22 0244  WBC 10.3  --   --  10.1 9.1  RBC 3.30* 3.39*  --  3.52* 3.27*  HGB 8.3*  --  8.4* 9.1* 8.3*  HCT 27.0*  --  27.4* 29.4* 27.1*  MCV 81.8  --   --  83.5 82.9  MCH 25.2*  --   --  25.9* 25.4*  MCHC 30.7  --   --  31.0 30.6  RDW 18.1*  --   --  18.2* 18.3*  PLT 270  --   --  282 282    Cardiac EnzymesNo results for input(s): "TROPONINI" in the last 168 hours. No results for input(s): "TROPIPOC" in the last 168 hours.   BNP Recent Labs  Lab 09/15/22 1307  BNP 481.6*     DDimer No results for input(s): "DDIMER" in the last 168 hours.   Radiology    No results found.  Cardiac Studies   TTE  09/16/2022 IMPRESSIONS     1. The basal septum measures 2.0 cm. Findings suggest HOCM with 75 mmHg  continuous mean gradient - valsalva not performed. Left ventricular  ejection fraction, by estimation, is 65 to 70%. Left ventricular ejection  fraction by PLAX is 65 %. The left  ventricle has normal function. The left ventricle has no regional wall  motion abnormalities. There is severe asymmetric left ventricular  hypertrophy of the basal-septal segment. Left ventricular diastolic  function could not be evaluated.   2. Right ventricular systolic function is low normal. The right  ventricular size is normal. There is severely elevated pulmonary artery  systolic pressure. The estimated right ventricular systolic pressure is  64.8 mmHg.   3. Left atrial size was massively dilated.   4. Right atrial size was mildly dilated.   5. SAM is noted. The mitral valve is degenerative. Moderate to severe  mitral valve regurgitation. Mild mitral stenosis. Severe mitral annular  calcification.   6. Tricuspid valve regurgitation is moderate.   7.  The aortic valve is tricuspid. Aortic valve regurgitation is not  visualized. Aortic valve sclerosis/calcification is present, without any  evidence of aortic stenosis.   8. The inferior vena cava is dilated in size with <50% respiratory  variability, suggesting right atrial pressure of 15 mmHg.   Comparison(s): Changes from prior study are noted. 09/02/2021: LVEF 60-65%,  RVSP 45 mmHg, HOCM with mean gradient 64 mmHg.   FINDINGS   Left Ventricle: The basal septum measures 2.0 cm. Findings suggest HOCM  with 75 mmHg continuous mean gradient - valsalva not performed. Left  ventricular ejection fraction, by estimation, is 65 to 70%. Left  ventricular ejection fraction by PLAX is 65 %.  The left ventricle has normal function. The left ventricle has no regional  wall motion abnormalities. The left ventricular internal cavity size was  normal in size. There is severe asymmetric left ventricular hypertrophy of  the basal-septal segment. Left  ventricular diastolic function could not be evaluated due to mitral  annular calcification (moderate or greater). Left ventricular diastolic  function could not be evaluated.   Right Ventricle: The right ventricular size is normal. No increase in  right ventricular wall thickness. Right ventricular systolic function is  low normal. There is severely elevated pulmonary artery systolic pressure.  The tricuspid regurgitant velocity  is 3.53 m/s, and with an assumed right atrial pressure of 15 mmHg, the  estimated right ventricular systolic pressure is 64.8 mmHg.   Left Atrium: Left atrial size was massively dilated.   Right Atrium: Right atrial size was mildly dilated.   Pericardium: There is no evidence of pericardial effusion.   Mitral Valve: SAM is noted. The mitral valve is degenerative in  appearance. Severe mitral annular calcification. Moderate to severe mitral  valve regurgitation, with posteriorly-directed jet. Mild mitral valve  stenosis.    Tricuspid Valve: The tricuspid valve is grossly normal. Tricuspid valve  regurgitation is moderate.   Aortic Valve: The aortic valve is tricuspid. Aortic valve regurgitation is  not visualized. Aortic valve sclerosis/calcification is present, without  any evidence of aortic stenosis.   Pulmonic Valve: The pulmonic valve was grossly normal. Pulmonic valve  regurgitation is trivial.   Aorta: The aortic root and ascending aorta are structurally normal, with  no evidence of dilitation.   Venous: The inferior vena cava is dilated in size with less than 50%  respiratory variability, suggesting right atrial pressure of 15 mmHg.   IAS/Shunts: No atrial level shunt  detected by color flow Doppler.    Patient Profile     67 y.o. female with hypertrophic cardiomyopathy recent echo showed elevated creatinine, paroxysmal atrial fibrillation status post cardioversion in January 2024, chronic heart failure with preserved ejection fraction, hyperlipidemia was admitted for evaluation due to symptomatic atrial fibrillation and fluid overload found to be anemic.  Assessment & Plan    Paroxysmal atrial fibrillation, now with symptomatic atrial fibrillation Hypertrophic cardiomyopathy increased creatinine Moderate pulmonary hypertension Volume overload/leg swelling, concern for cellulitis AKI on CKD Hyperlipidemia Anemia   Still symptomatic which I suspect her atrial fibrillation is leading her shortness of breath symptoms.  She will really benefit from a inpatient TEE cardioversion.  She is agreeable this is planned for Tuesday. Great improvement with her volume overload since being in the hospital. Last BMP was verified to recommend, getting BMP to assess her kidney function as well as electrolytes. Continue current lipid-lowering medication regimen.     For questions or updates, please contact [facility] Please consult [email or web address] for contact info under Cardiology/STEMI.       Osvaldo Shipper, DO  09/19/2022, 9:37 AM

## 2022-09-19 NOTE — Plan of Care (Signed)

## 2022-09-19 NOTE — Assessment & Plan Note (Addendum)
Suspect this could be contributing to her dyspnea.  Could be due to inadequate control of afib. Patient is rate controlled, but per cardiology could benefit from rhythm control as well likely due to HOCM. Initial CXR without pleural effusions, but does have cardiomegaly. Does not appear profusely volume overloaded on exam and BNP elevated though not her max. Additionally has chronic bilateral lower extremity edema. -Given Lasix 40 mg in the ED. Additional Lasix has been held since admission due to hypotension and pt does have a AKI -home Midodrine 5mg  BID -Cardiology following, appreciate recs -Daily weights -TSH 0.560 WNL

## 2022-09-20 ENCOUNTER — Inpatient Hospital Stay (HOSPITAL_COMMUNITY): Payer: Medicare PPO

## 2022-09-20 ENCOUNTER — Encounter (HOSPITAL_COMMUNITY)
Admission: EM | Disposition: A | Discharge status: Home Health | Payer: Self-pay | Source: Home / Self Care | Attending: Family Medicine

## 2022-09-20 ENCOUNTER — Other Ambulatory Visit: Payer: Self-pay

## 2022-09-20 ENCOUNTER — Encounter (HOSPITAL_COMMUNITY): Payer: Self-pay | Admitting: Internal Medicine

## 2022-09-20 ENCOUNTER — Inpatient Hospital Stay (HOSPITAL_COMMUNITY): Payer: Medicare PPO | Admitting: Certified Registered Nurse Anesthetist

## 2022-09-20 ENCOUNTER — Other Ambulatory Visit: Payer: Self-pay | Admitting: Student

## 2022-09-20 DIAGNOSIS — I110 Hypertensive heart disease with heart failure: Secondary | ICD-10-CM

## 2022-09-20 DIAGNOSIS — I421 Obstructive hypertrophic cardiomyopathy: Secondary | ICD-10-CM | POA: Diagnosis not present

## 2022-09-20 DIAGNOSIS — I361 Nonrheumatic tricuspid (valve) insufficiency: Secondary | ICD-10-CM | POA: Diagnosis not present

## 2022-09-20 DIAGNOSIS — I4891 Unspecified atrial fibrillation: Secondary | ICD-10-CM

## 2022-09-20 DIAGNOSIS — I5032 Chronic diastolic (congestive) heart failure: Secondary | ICD-10-CM | POA: Diagnosis not present

## 2022-09-20 DIAGNOSIS — I340 Nonrheumatic mitral (valve) insufficiency: Secondary | ICD-10-CM

## 2022-09-20 DIAGNOSIS — Z87891 Personal history of nicotine dependence: Secondary | ICD-10-CM

## 2022-09-20 HISTORY — PX: TEE WITHOUT CARDIOVERSION: SHX5443

## 2022-09-20 HISTORY — PX: CARDIOVERSION: SHX1299

## 2022-09-20 LAB — CBC
HCT: 26.8 % — ABNORMAL LOW (ref 36.0–46.0)
Hemoglobin: 8.4 g/dL — ABNORMAL LOW (ref 12.0–15.0)
MCH: 26.3 pg (ref 26.0–34.0)
MCHC: 31.3 g/dL (ref 30.0–36.0)
MCV: 83.8 fL (ref 80.0–100.0)
Platelets: 262 10*3/uL (ref 150–400)
RBC: 3.2 MIL/uL — ABNORMAL LOW (ref 3.87–5.11)
RDW: 18.5 % — ABNORMAL HIGH (ref 11.5–15.5)
WBC: 10 10*3/uL (ref 4.0–10.5)
nRBC: 0 % (ref 0.0–0.2)

## 2022-09-20 LAB — BASIC METABOLIC PANEL
Anion gap: 7 (ref 5–15)
BUN: 20 mg/dL (ref 8–23)
CO2: 19 mmol/L — ABNORMAL LOW (ref 22–32)
Calcium: 9.2 mg/dL (ref 8.9–10.3)
Chloride: 109 mmol/L (ref 98–111)
Creatinine, Ser: 1.57 mg/dL — ABNORMAL HIGH (ref 0.44–1.00)
GFR, Estimated: 35 mL/min — ABNORMAL LOW (ref >60)
Glucose, Bld: 127 mg/dL — ABNORMAL HIGH (ref 70–99)
Potassium: 4.2 mmol/L (ref 3.5–5.1)
Sodium: 135 mmol/L (ref 135–145)

## 2022-09-20 LAB — MAGNESIUM: Magnesium: 1.9 mg/dL (ref 1.7–2.4)

## 2022-09-20 SURGERY — ECHOCARDIOGRAM, TRANSESOPHAGEAL
Anesthesia: Monitor Anesthesia Care

## 2022-09-20 MED ORDER — PROPOFOL 500 MG/50ML IV EMUL
INTRAVENOUS | Status: DC | PRN
  Administered 2022-09-20: 100 ug/kg/min via INTRAVENOUS

## 2022-09-20 MED ORDER — METOPROLOL TARTRATE 5 MG/5ML IV SOLN
2.5000 mg | INTRAVENOUS | Status: DC | PRN
  Filled 2022-09-20: qty 5

## 2022-09-20 MED ORDER — PROPOFOL 10 MG/ML IV BOLUS
INTRAVENOUS | Status: DC | PRN
  Administered 2022-09-20: 40 mg via INTRAVENOUS
  Administered 2022-09-20: 20 mg via INTRAVENOUS

## 2022-09-20 MED ORDER — METOPROLOL SUCCINATE ER 25 MG PO TB24
25.0000 mg | ORAL_TABLET | Freq: Every day | ORAL | Status: DC
  Administered 2022-09-20 – 2022-09-21 (×2): 25 mg via ORAL
  Filled 2022-09-20 (×2): qty 1

## 2022-09-20 MED ORDER — PHENYLEPHRINE 80 MCG/ML (10ML) SYRINGE FOR IV PUSH (FOR BLOOD PRESSURE SUPPORT)
PREFILLED_SYRINGE | INTRAVENOUS | Status: DC | PRN
  Administered 2022-09-20 (×3): 160 ug via INTRAVENOUS

## 2022-09-20 MED ORDER — MAGNESIUM SULFATE IN D5W 1-5 GM/100ML-% IV SOLN
1.0000 g | Freq: Once | INTRAVENOUS | Status: AC
  Administered 2022-09-20: 1 g via INTRAVENOUS
  Filled 2022-09-20: qty 100

## 2022-09-20 SURGICAL SUPPLY — 1 items: ELECT DEFIB PAD ADLT CADENCE (PAD) ×1 IMPLANT

## 2022-09-20 NOTE — Assessment & Plan Note (Addendum)
In the setting of HFpEF. Now euvolemic on exam after initial dose of lasix. BP slightly soft and Cr elevated, hesitant with further diuresis. Echo with EF 65-70%, severe pHTN, MR. -continue Midodrine 5mg  BID -Cardiology following, appreciate recs -Consider SGLT2 as renal function improves -Daily weights, strict I/Os, no output recorded -TSH 0.560 WNL

## 2022-09-20 NOTE — Progress Notes (Signed)
Orthopedic Tech Progress Note Patient Details:  Christine Jacobson 25-Jan-1955 [id number]  Ortho Devices Type of Ortho Device: Ace wrap, Unna boot Ortho Device/Splint Location: BLE Ortho Device/Splint Interventions: Ordered, Application, Adjustment   Post Interventions Patient Tolerated: Well Instructions Provided: Care of device  Donald Pore 09/20/2022, 5:12 PM

## 2022-09-20 NOTE — Plan of Care (Signed)
  Problem: Health Behavior/Discharge Planning: Goal: Ability to manage health-related needs will improve Outcome: Progressing   Problem: Clinical Measurements: Goal: Will remain free from infection Outcome: Progressing Goal: Respiratory complications will improve Outcome: Progressing Goal: Cardiovascular complication will be avoided Outcome: Progressing   Problem: Activity: Goal: Risk for activity intolerance will decrease Outcome: Progressing   Problem: Nutrition: Goal: Adequate nutrition will be maintained Outcome: Progressing   Problem: Coping: Goal: Level of anxiety will decrease Outcome: Progressing   Problem: Pain Managment: Goal: General experience of comfort will improve Outcome: Progressing   Problem: Safety: Goal: Ability to remain free from injury will improve Outcome: Progressing

## 2022-09-20 NOTE — Assessment & Plan Note (Signed)
Patient has been anxious since arrival, due to multiple recent hospital admissions and some stressors in her personal life.  Patient is prescribed hydroxyzine 10 mg 3 times daily at home as needed for anxiety -Discontinued hydroxyzine during the day due to sleepiness, will add back to bedtime regimen -Started Lexapro 5 mg daily -Consult to spiritual care placed at patient's request

## 2022-09-20 NOTE — Progress Notes (Signed)
FMTS Interim Progress Note  S: Went to see patient at bedside with Dr. Yetta Barre. Nurse notified us that her HR was elevated up to 130s on A-Fib. She denies any chest pain, SOB, dizziness but feels some palpitations. Patient was calm and in no acute distress. Currently she is NPO since she is scheduled for Cardioversion tomorrow.  O: BP 128/79 (BP Location: Left Arm)   Pulse 95   Temp 99.1 F (37.3 C) (Oral)   Resp 20   Ht 5\' 4"  (1.626 m)   Wt 89.8 kg   SpO2 97%   BMI 33.98 kg/m   General: awake and alert, NAD CV: irregularly irregular, tachycardic Respiratory: CTAB. No wheezing, crackles, or rhonchi noted  A/P: A Fib with RVR Heart rate elevated to 120s to 140s while examining patient in the room and consistently fluctuating.  Patient doing well and has no other symptoms.  Due to patient's heart rate being elevated and blood pressure being stable at around 128/80, we discussed with the patient that IV metoprolol would be beneficial to help moderate her heart rate, prior to her cardioversion tomorrow.  She stood and agreed with the plan.  Discussed with pharmacy as well, they agreed with our plan. - IV metoprolol 2.5 mg every 5 minutes as needed up to 3 doses - Hold parameters placed if HR < 120 or SBP < 110 or DBP < 55 - Will continue to monitor heart rate and blood pressure  Rest of plan per day team progress note.  Fortunato Curling, DO 09/20/2022, 12:39 AM PGY-1, [facility] Service pager [phone]

## 2022-09-20 NOTE — Interval H&P Note (Signed)
History and Physical Interval Note:  09/20/2022 10:24 AM  Christine Jacobson  has presented today for surgery, with the diagnosis of afib.  The various methods of treatment have been discussed with the patient and family. After consideration of risks, benefits and other options for treatment, the patient has consented to  Procedure(s): TRANSESOPHAGEAL ECHOCARDIOGRAM (N/A) CARDIOVERSION (N/A) as a surgical intervention.  The patient's history has been reviewed, patient examined, no change in status, stable for surgery.  I have reviewed the patient's chart and labs.  Questions were answered to the patient's satisfaction.     Myquan Schaumburg

## 2022-09-20 NOTE — Assessment & Plan Note (Signed)
Chronic BLE swelling with venous stasis changes. - low suspicion for cellulitis, no significant warmth, drainage, afebrile - low suspicion for dvt, pt reliably takes eliquis, no asymmetric leg swelling or calf tenderness - wound care consulted, appreciate assistance with this patient - unna boot to be replaced today

## 2022-09-20 NOTE — Assessment & Plan Note (Signed)
Hgb 8.4 yesterday from 9.8 on admission.  Baseline hgb appears to be around 7.  Patient required blood transfusions during her last hospital admission, however had a colonoscopy and EGD at that time which showed diverticulosis, internal hemorrhoids, and tubular adenomatous polyp but no source of bleeding. -s/p IV iron infusion -monitor CBC, plan to repeat in am prior to DCCV -transfusion threshold <7

## 2022-09-20 NOTE — CV Procedure (Signed)
   TRANSESOPHAGEAL ECHOCARDIOGRAM GUIDED DIRECT CURRENT CARDIOVERSION  NAME:  Christine Jacobson   MRN: [id number] DOB:  1955/09/23   ADMIT DATE: 09/15/2022  INDICATIONS:  Atrial fibrillation  PROCEDURE:   Informed consent was obtained prior to the procedure. The risks, benefits and alternatives for the procedure were discussed and the patient comprehended these risks.  Risks include, but are not limited to, cough, sore throat, vomiting, nausea, somnolence, esophageal and stomach trauma or perforation, bleeding, low blood pressure, aspiration, pneumonia, infection, trauma to the teeth and death.    After a procedural time-out, the oropharynx was anesthetized and the patient was sedated by the anesthesia service. The transesophageal probe was inserted in the esophagus and stomach without difficulty and multiple views were obtained.   FINDINGS:  LEFT VENTRICLE: EF = 70% Severe asymmetric septal hypertrophy c/w HOCM. LVOT gradient not measured  RIGHT VENTRICLE: Low normal function  LEFT ATRIUM: Massively dilated  LEFT ATRIAL APPENDAGE: No clot  RIGHT ATRIUM: Moderately dilated  AORTIC VALVE:  Trileaflet. Mildly calcified. No AI  MITRAL VALVE:    + severe MAC with posterior leaflet restriction Severe MR  TRICUSPID VALVE: Normal Moderate TR  PULMONIC VALVE: Normal. Mild PI  INTERATRIAL SEPTUM: No PFO/ASD  PERICARDIUM: No significant effusion  DESCENDING AORTA: Not well visualized   CARDIOVERSION:     Indications:  Atrial Fibrillation  Procedure Details:  Once the TEE was complete, the patient had the defibrillator pads placed in the anterior and posterior position. Once an appropriate level of sedation was achieved, the patient received a single biphasic, synchronized 200J shock with prompt conversion to sinus rhythm. No apparent complications.   Arvilla Meres, MD  10:43 AM

## 2022-09-20 NOTE — Plan of Care (Signed)
  Problem: Education: Goal: Knowledge of General Education information will improve Description: Including pain rating scale, medication(s)/side effects and non-pharmacologic comfort measures 09/20/2022 0938 by Georgiann Mccoy, RN Outcome: Progressing 09/20/2022 0938 by Georgiann Mccoy, RN Outcome: Progressing   Problem: Health Behavior/Discharge Planning: Goal: Ability to manage health-related needs will improve 09/20/2022 0938 by Georgiann Mccoy, RN Outcome: Progressing 09/20/2022 0938 by Georgiann Mccoy, RN Outcome: Progressing   Problem: Clinical Measurements: Goal: Ability to maintain clinical measurements within normal limits will improve 09/20/2022 0938 by Georgiann Mccoy, RN Outcome: Progressing 09/20/2022 0938 by Georgiann Mccoy, RN Outcome: Progressing Goal: Will remain free from infection Outcome: Progressing Goal: Diagnostic test results will improve Outcome: Progressing Goal: Respiratory complications will improve Outcome: Progressing Goal: Cardiovascular complication will be avoided Outcome: Progressing   Problem: Activity: Goal: Risk for activity intolerance will decrease Outcome: Progressing   Problem: Nutrition: Goal: Adequate nutrition will be maintained Outcome: Progressing   Problem: Coping: Goal: Level of anxiety will decrease Outcome: Progressing   Problem: Elimination: Goal: Will not experience complications related to bowel motility Outcome: Progressing Goal: Will not experience complications related to urinary retention Outcome: Progressing   Problem: Pain Managment: Goal: General experience of comfort will improve Outcome: Progressing   Problem: Safety: Goal: Ability to remain free from injury will improve Outcome: Progressing   Problem: Skin Integrity: Goal: Risk for impaired skin integrity will decrease Outcome: Progressing

## 2022-09-20 NOTE — Progress Notes (Signed)
Evaluated patient at bedside after her TEE/DCCV.  Patient in normal sinus rhythm at 91.  States that she feels good just tired.  Would like to stay another night. Spoke with patient's nurse about replacing her Unna boots today.  Will follow-up on this again in a little bit.

## 2022-09-20 NOTE — Progress Notes (Signed)
Daily Progress Note Intern Pager: [phone]  Patient name: Christine Jacobson Medical record number: [id number] Date of birth: 12/23/1955 Age: 67 y.o. Gender: female  Primary Care Provider: Karenann Cai, NP Consultants: cardiology Code Status: DNR  Pt Overview and Major Events to Date:  8/29 - admitted   Assessment and Plan:  67 year old female with past medical history of HOCM, PAF, diastolic CHF, hypertension admitted for acute on chronic HFpEF and recurrent afib. Awaiting TEE/DCCV today 9/3. Assessment & Plan Atrial fibrillation by electrocardiogram [facility] [place]) Did have tachycardia 100-126 throughout the night, now rate controlled at 92. Notably pt was feeling very anxious last night and did not sleep much.  - Appreciate cardiology, plan for TEE/DCCV on Tues 9/3 - Heart healthy diet, NPO 9/3 midnight - Continued home Eliquis, pt states she has been taking this regularly - have been holding home metoprolol due to hypotension, consider adding back if continues to be tachycardic HOCM (hypertrophic obstructive cardiomyopathy) (HCC) In the setting of HFpEF. Now euvolemic on exam after initial dose of lasix. BP slightly soft and Cr elevated, hesitant with further diuresis. Echo with EF 65-70%, severe pHTN, MR. -continue Midodrine 5mg  BID -Cardiology following, appreciate recs -Consider SGLT2 as renal function improves -Daily weights, strict I/Os, no output recorded -TSH 0.560 WNL Acute exacerbation of CHF (congestive heart failure) (HCC) Suspect this could be contributing to her dyspnea.  Could be due to inadequate control of afib. Patient is rate controlled, but per cardiology could benefit from rhythm control as well likely due to HOCM. Initial CXR without pleural effusions, but does have cardiomegaly. Does not appear profusely volume overloaded on exam and BNP elevated though not her max. Additionally has chronic bilateral lower extremity edema. -Given Lasix 40 mg in the ED.  Additional Lasix has been held since admission due to hypotension and pt does have a AKI -home Midodrine 5mg  BID -Cardiology following, appreciate recs -Daily weights -TSH 0.560 WNL Normocytic anemia Hgb 8.4 yesterday from 9.8 on admission.  Baseline hgb appears to be around 7.  Patient required blood transfusions during her last hospital admission, however had a colonoscopy and EGD at that time which showed diverticulosis, internal hemorrhoids, and tubular adenomatous polyp but no source of bleeding. -s/p IV iron infusion -monitor CBC, plan to repeat in am prior to DCCV -transfusion threshold <7 AKI (acute kidney injury) (HCC) Most recent creatinine 1.57.  Appears her baseline is a little over 1.  -Continue to hold additional Lasix diuresis -Caution with nephrotoxic agents -BMP am Anxiety Patient has been anxious since arrival, due to multiple recent hospital admissions and some stressors in her personal life.  Patient is prescribed hydroxyzine 10 mg 3 times daily at home as needed for anxiety -Discontinued hydroxyzine during the day due to sleepiness, will add back to bedtime regimen -Started Lexapro 5 mg daily -Consult to spiritual care placed at patient's request Leg swelling Chronic BLE swelling with venous stasis changes. - low suspicion for cellulitis, no significant warmth, drainage, afebrile - low suspicion for dvt, pt reliably takes eliquis, no asymmetric leg swelling or calf tenderness - wound care consulted, appreciate assistance with this patient - unna boot to be replaced today  Chronic and Stable Problems:  HLD - increased atorvastatin to 80mg  per cards    FEN/GI: heart healthy PPx: eliquis Dispo:Home today? after DCCV  Subjective:  Pt's HR did go to the 120-130s last night while in afib. Our team ordered metoprolol 2.5mg  however pt did not need it and her HR  came back down on its own. She is currently resting comfortably.    Objective: Temp:  [97.8 F (36.6  C)-99.1 F (37.3 C)] 98.5 F (36.9 C) (09/03 0731) Pulse Rate:  [90-110] 90 (09/03 0731) Resp:  [15-23] 16 (09/03 0433) BP: (111-128)/(70-91) 119/78 (09/03 0731) SpO2:  [96 %-100 %] 99 % (09/03 0731) Weight:  [88.6 kg] 88.6 kg (09/03 0433) Physical Exam: General: Resting comfortably, no acute distress Cardiovascular: Tachycardic at 101, irregularly irregular rhythm, systolic murmur Respiratory: CTAB, no increased work of breathing Abdomen: soft, non-distended Extremities: unna boots in place  Laboratory: Most recent CBC Lab Results  Component Value Date   WBC 10.0 09/20/2022   HGB 8.4 (L) 09/20/2022   HCT 26.8 (L) 09/20/2022   MCV 83.8 09/20/2022   PLT 262 09/20/2022   Most recent BMP    Latest Ref Rng & Units 09/20/2022    4:50 AM  BMP  Glucose 70 - 99 mg/dL 829   BUN 8 - 23 mg/dL 20   Creatinine 5.62 - 1.00 mg/dL 1.30   Sodium 865 - 784 mmol/L 135   Potassium 3.5 - 5.1 mmol/L 4.2   Chloride 98 - 111 mmol/L 109   CO2 22 - 32 mmol/L 19   Calcium 8.9 - 10.3 mg/dL 9.2    Imaging/Diagnostic Tests: Pending TEE/DCCV today  Siraj Dermody, DO 09/20/2022, 8:19 AM  PGY-1, [place] [facility] FPTS Intern pager: [phone], text pages welcome Secure chat group [facility] Teaching Service

## 2022-09-20 NOTE — Assessment & Plan Note (Signed)
Suspect this could be contributing to her dyspnea.  Could be due to inadequate control of afib. Patient is rate controlled, but per cardiology could benefit from rhythm control as well likely due to HOCM. Initial CXR without pleural effusions, but does have cardiomegaly. Does not appear profusely volume overloaded on exam and BNP elevated though not her max. Additionally has chronic bilateral lower extremity edema. -Given Lasix 40 mg in the ED. Additional Lasix has been held since admission due to hypotension and pt does have a AKI -home Midodrine 5mg  BID -Cardiology following, appreciate recs -Daily weights -TSH 0.560 WNL

## 2022-09-20 NOTE — Progress Notes (Signed)
   09/20/22 1200  Spiritual Encounters  Type of Visit Initial  Care provided to: Patient  Conversation partners present during encounter Nurse  Referral source Patient request  Reason for visit Routine spiritual support  OnCall Visit No  Spiritual Framework  Presenting Themes Meaning/purpose/sources of inspiration;Goals in life/care;Values and beliefs;Significant life change  Community/Connection Friend(s);Faith community  Patient Stress Factors Health changes  Family Stress Factors Health changes  Interventions  Spiritual Care Interventions Made Established relationship of care and support;Compassionate presence;Reflective listening;Normalization of emotions  Intervention Outcomes  Outcomes Connection to spiritual care;Awareness around self/spiritual resourses;Connection to values and goals of care;Awareness of support;Awareness of health  Spiritual Care Plan  Spiritual Care Issues Still Outstanding No further spiritual care needs at this time (see row info)   The Chaplain responded to request for consultation. The pt Christine Jacobson had got Afib surgery for today. Her concern was about major health conditions. 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 She said that she loves the cook because it keeps her busy and distract not to over think other things. She wants to go back to her active life again as soon as possible. This is her fourth time to being hospitalized, she is exhausted to be in the hospital. The chaplain listened attentively, provided spiritual care.   M.Kubra Haniyyah Sakuma, MA Chaplain Intern [phone]

## 2022-09-20 NOTE — Transfer of Care (Signed)
Immediate Anesthesia Transfer of Care Note  Patient: Christine Jacobson  Procedure(s) Performed: TRANSESOPHAGEAL ECHOCARDIOGRAM CARDIOVERSION  Patient Location: Cath Lab  Anesthesia Type:MAC  Level of Consciousness: drowsy and patient cooperative  Airway & Oxygen Therapy: Patient Spontanous Breathing and Patient connected to face mask oxygen  Post-op Assessment: Report given to RN, Post -op Vital signs reviewed and stable, and Patient moving all extremities X 4  Post vital signs: Reviewed and stable  Last Vitals:  Vitals Value Taken Time  BP 89/62 09/20/22 1046  Temp    Pulse 90 09/20/22 1047  Resp 9 09/20/22 1047  SpO2 96 % 09/20/22 1047  Vitals shown include unfiled device data.  Last Pain:  Vitals:   09/20/22 0903  TempSrc: Temporal  PainSc:       Patients Stated Pain Goal: 0 (09/19/22 2359)  Complications: No notable events documented.

## 2022-09-20 NOTE — Anesthesia Preprocedure Evaluation (Addendum)
Anesthesia Evaluation  Patient identified by MRN, date of birth, ID band Patient awake    Reviewed: Allergy & Precautions, H&P , NPO status , Patient's Chart, lab work & pertinent test results  Airway Mallampati: II  TM Distance: >3 FB Neck ROM: Full    Dental no notable dental hx.    Pulmonary former smoker   Pulmonary exam normal breath sounds clear to auscultation       Cardiovascular hypertension, Normal cardiovascular exam+ dysrhythmias Atrial Fibrillation + Valvular Problems/Murmurs  Rhythm:Regular Rate:Normal  HOCM     Neuro/Psych   Anxiety     negative neurological ROS     GI/Hepatic negative GI ROS, Neg liver ROS,,,  Endo/Other  negative endocrine ROS    Renal/GU   negative genitourinary   Musculoskeletal negative musculoskeletal ROS (+)    Abdominal   Peds negative pediatric ROS (+)  Hematology  (+) Blood dyscrasia, anemia   Anesthesia Other Findings   Reproductive/Obstetrics negative OB ROS                              Anesthesia Physical Anesthesia Plan  ASA: 3  Anesthesia Plan: MAC   Post-op Pain Management: Minimal or no pain anticipated   Induction: Intravenous  PONV Risk Score and Plan: 2 and Propofol infusion and Treatment may vary due to age or medical condition  Airway Management Planned: Simple Face Mask  Additional Equipment:   Intra-op Plan:   Post-operative Plan:   Informed Consent: I have reviewed the patients History and Physical, chart, labs and discussed the procedure including the risks, benefits and alternatives for the proposed anesthesia with the patient or authorized representative who has indicated his/her understanding and acceptance.     Dental advisory given  Plan Discussed with: CRNA and Surgeon  Anesthesia Plan Comments:         Anesthesia Quick Evaluation

## 2022-09-20 NOTE — Assessment & Plan Note (Signed)
Did have tachycardia 100-126 throughout the night, now rate controlled at 92. Notably pt was feeling very anxious last night and did not sleep much.  - Appreciate cardiology, plan for TEE/DCCV on Tues 9/3 - Heart healthy diet, NPO 9/3 midnight - Continued home Eliquis, pt states she has been taking this regularly - have been holding home metoprolol due to hypotension, consider adding back if continues to be tachycardic

## 2022-09-20 NOTE — Progress Notes (Addendum)
Patient Name: Christine Jacobson Date of Encounter: 09/20/2022 [facility] Cardiologist: Thomasene Ripple, DO   Interval Summary  .    Patient seen this morning prior to transport for TEE/DCCV. She admits to some pre-procedure anxiety but is ready having waited over the weekend. She reports stable breathing and no chest pain. Overnight had intermittent palpitations/sensation of rapid HR.   Vital Signs .    Vitals:   09/20/22 0204 09/20/22 0319 09/20/22 0433 09/20/22 0731  BP: 111/70 123/76 114/82 119/78  Pulse:  (!) 110  90  Resp: 16 17 16    Temp:  98.6 F (37 C)  98.5 F (36.9 C)  TempSrc:  Oral  Oral  SpO2: 96% 97% 97% 99%  Weight:   88.6 kg   Height:       No intake or output data in the 24 hours ending 09/20/22 0854    09/20/2022    4:33 AM 09/19/2022    4:55 AM 09/18/2022    4:31 AM  Last 3 Weights  Weight (lbs) 195 lb 5.2 oz 197 lb 15.6 oz 198 lb 10.2 oz  Weight (kg) 88.6 kg 89.8 kg 90.1 kg      Telemetry/ECG    Persistent afib with variable ventricular response. Ventricular rates primarily in the lower 100s but occasional RVR into the 130s - Personally Reviewed  Physical Exam .   GEN: No acute distress. Anxious appearing  Neck: No JVD Cardiac: Irregularly irregular, systolic murmur over apex, 3/6, rubs, or gallops.  Respiratory: bilateral bases with crackles GI: Soft, nontender, non-distended  MS: minimal LE edema bilaterally with compression dressings in place  Assessment & Plan .     Atrial fibrillation with RVR  Patient with ongoing symptomatic afib, improved rate control though still with occasional breakthrough RVR.   TEE/DCCV today. Originally delayed by low HGB.  Continue Eliquis 5mg  BID Home Metoprolol Succinate has been held this admission with lower BP. Over the last 24 hours, appears to have had much more stable BP, plan to resume Metoprolol Succinate at lower 25mg  dose today and titrate as able.   Hypertrophic cardiomyopathy Acute on Chronic  HFpEF Moderate pulmonary hypertension  Patient with symptoms of acute CHF on admission with concern of missed lasix dosing. TTE this admission with findings suggest HOCM with 75 mmHg  continuous mean gradient - valsalva not performed. Left ventricular  ejection fraction, by estimation, is 65 to 70%. Left ventricular ejection  fraction by PLAX is 65 %. Severely elevated pulmonary artery  systolic pressure. The estimated right ventricular systolic pressure is  64.8 mmHg.   Given elevated gradients as well as AKI, aggressive diuresis not pursued. She has compression dressings on lower extremities with improvement in edema. Faint bi-basilar crackles on pulmonary exam.  Suspect that volume status will improve with restoration of NSR today. Not clearly hypervolemic on exam today.  GDMT limited by AKI. Consider SGLT2 if creatinine stabilizes.    Moderate to Severe MR  TTE this admission showing moderate to severe  mitral valve regurgitation. Mild mitral stenosis. Severe mitral annular  calcification.    AKI on CKD  Creatinine 1.57 today. As high as 1.9 this admission. Recent baseline closer to 1.1-1.2.   Hyperlipidemia  LDL not at goal on admission and statin increased. Continue Atorvastatin 80mg .   Per primary team: Chronic anemia AKI on CKD For questions or updates, please contact [facility] Please consult [email or web address] for contact info under     Signed, Perlie Gold, PA-C  Patient seen and examined, note reviewed with the signed Advanced Practice Provider. I personally reviewed laboratory data, imaging studies and relevant notes. I independently examined the patient and formulated the important aspects of the plan. I have personally discussed the plan with the patient and/or family. Comments or changes to the note/plan are indicated below.  Slightly short of breath.  Plan for TEE cardioversion this morning.  She is in agreement with this.  Informed Consent    Shared Decision Making/Informed Consent The risks [stroke, cardiac arrhythmias rarely resulting in the need for a temporary or permanent pacemaker, skin irritation or burns, esophageal damage, perforation (1:10,000 risk), bleeding, pharyngeal hematoma as well as other potential complications associated with conscious sedation including aspiration, arrhythmia, respiratory failure and death], benefits (treatment guidance, restoration of normal sinus rhythm, diagnostic support) and alternatives of a transesophageal echocardiogram guided cardioversion were discussed in detail with Christine Jacobson and she is willing to proceed.     Thomasene Ripple DO, MS [facility] Attending Cardiologist [facility]  [address] [place], [place] [address] [phone] Website: [email or web address]

## 2022-09-20 NOTE — Anesthesia Postprocedure Evaluation (Signed)
Anesthesia Post Note  Patient: Christine Jacobson  Procedure(s) Performed: TRANSESOPHAGEAL ECHOCARDIOGRAM CARDIOVERSION     Patient location during evaluation: PACU Anesthesia Type: MAC Level of consciousness: awake and alert Pain management: pain level controlled Vital Signs Assessment: post-procedure vital signs reviewed and stable Respiratory status: spontaneous breathing, nonlabored ventilation, respiratory function stable and patient connected to nasal cannula oxygen Cardiovascular status: stable and blood pressure returned to baseline Postop Assessment: no apparent nausea or vomiting Anesthetic complications: no   No notable events documented.  Last Vitals:  Vitals:   09/20/22 1105 09/20/22 1138  BP: 111/70 123/72  Pulse: 87 84  Resp: 16 16  Temp:  36.6 C  SpO2: 92% 100%    Last Pain:  Vitals:   09/20/22 1200  TempSrc:   PainSc: 0-No pain                  [place]

## 2022-09-20 NOTE — H&P (View-Only) (Signed)
Patient Name: Christine Jacobson Date of Encounter: 09/20/2022 [facility] Cardiologist: Thomasene Ripple, DO   Interval Summary  .    Patient seen this morning prior to transport for TEE/DCCV. She admits to some pre-procedure anxiety but is ready having waited over the weekend. She reports stable breathing and no chest pain. Overnight had intermittent palpitations/sensation of rapid HR.   Vital Signs .    Vitals:   09/20/22 0204 09/20/22 0319 09/20/22 0433 09/20/22 0731  BP: 111/70 123/76 114/82 119/78  Pulse:  (!) 110  90  Resp: 16 17 16    Temp:  98.6 F (37 C)  98.5 F (36.9 C)  TempSrc:  Oral  Oral  SpO2: 96% 97% 97% 99%  Weight:   88.6 kg   Height:       No intake or output data in the 24 hours ending 09/20/22 0854    09/20/2022    4:33 AM 09/19/2022    4:55 AM 09/18/2022    4:31 AM  Last 3 Weights  Weight (lbs) 195 lb 5.2 oz 197 lb 15.6 oz 198 lb 10.2 oz  Weight (kg) 88.6 kg 89.8 kg 90.1 kg      Telemetry/ECG    Persistent afib with variable ventricular response. Ventricular rates primarily in the lower 100s but occasional RVR into the 130s - Personally Reviewed  Physical Exam .   GEN: No acute distress. Anxious appearing  Neck: No JVD Cardiac: Irregularly irregular, systolic murmur over apex, 3/6, rubs, or gallops.  Respiratory: bilateral bases with crackles GI: Soft, nontender, non-distended  MS: minimal LE edema bilaterally with compression dressings in place  Assessment & Plan .     Atrial fibrillation with RVR  Patient with ongoing symptomatic afib, improved rate control though still with occasional breakthrough RVR.   TEE/DCCV today. Originally delayed by low HGB.  Continue Eliquis 5mg  BID Home Metoprolol Succinate has been held this admission with lower BP. Over the last 24 hours, appears to have had much more stable BP, plan to resume Metoprolol Succinate at lower 25mg  dose today and titrate as able.   Hypertrophic cardiomyopathy Acute on Chronic  HFpEF Moderate pulmonary hypertension  Patient with symptoms of acute CHF on admission with concern of missed lasix dosing. TTE this admission with findings suggest HOCM with 75 mmHg  continuous mean gradient - valsalva not performed. Left ventricular  ejection fraction, by estimation, is 65 to 70%. Left ventricular ejection  fraction by PLAX is 65 %. Severely elevated pulmonary artery  systolic pressure. The estimated right ventricular systolic pressure is  64.8 mmHg.   Given elevated gradients as well as AKI, aggressive diuresis not pursued. She has compression dressings on lower extremities with improvement in edema. Faint bi-basilar crackles on pulmonary exam.  Suspect that volume status will improve with restoration of NSR today. Not clearly hypervolemic on exam today.  GDMT limited by AKI. Consider SGLT2 if creatinine stabilizes.    Moderate to Severe MR  TTE this admission showing moderate to severe  mitral valve regurgitation. Mild mitral stenosis. Severe mitral annular  calcification.    AKI on CKD  Creatinine 1.57 today. As high as 1.9 this admission. Recent baseline closer to 1.1-1.2.   Hyperlipidemia  LDL not at goal on admission and statin increased. Continue Atorvastatin 80mg .   Per primary team: Chronic anemia AKI on CKD For questions or updates, please contact [facility] Please consult [email or web address] for contact info under     Signed, Perlie Gold, PA-C  Patient seen and examined, note reviewed with the signed Advanced Practice Provider. I personally reviewed laboratory data, imaging studies and relevant notes. I independently examined the patient and formulated the important aspects of the plan. I have personally discussed the plan with the patient and/or family. Comments or changes to the note/plan are indicated below.  Slightly short of breath.  Plan for TEE cardioversion this morning.  She is in agreement with this.  Informed Consent    Shared Decision Making/Informed Consent The risks [stroke, cardiac arrhythmias rarely resulting in the need for a temporary or permanent pacemaker, skin irritation or burns, esophageal damage, perforation (1:10,000 risk), bleeding, pharyngeal hematoma as well as other potential complications associated with conscious sedation including aspiration, arrhythmia, respiratory failure and death], benefits (treatment guidance, restoration of normal sinus rhythm, diagnostic support) and alternatives of a transesophageal echocardiogram guided cardioversion were discussed in detail with Christine Jacobson and she is willing to proceed.     Thomasene Ripple DO, MS [facility] Attending Cardiologist [facility]  [address] [place], [place] [address] [phone] Website: [email or web address]

## 2022-09-20 NOTE — Progress Notes (Signed)
PT Cancellation Note  Patient Details Name: Christine Jacobson MRN: [id number] DOB: 30-Nov-1955   Cancelled Treatment:    Reason Eval/Treat Not Completed: Patient at procedure or test/unavailable  Marye Round, PT DPT Acute Rehabilitation Services Secure Chat Preferred  Office [phone]   Kamala Kolton E Christain [place] 09/20/2022, 11:00 AM

## 2022-09-20 NOTE — Assessment & Plan Note (Addendum)
Most recent creatinine 1.57.  Appears her baseline is a little over 1.  -Continue to hold additional Lasix diuresis -Caution with nephrotoxic agents -BMP am

## 2022-09-20 NOTE — Progress Notes (Signed)
Occupational Therapy Treatment Patient Details Name: Christine Jacobson MRN: [id number] DOB: August 05, 1955 Today's Date: 09/20/2022   History of present illness Pt is a 67 y/o female presenting 8/29 with worsening dyspnea.  Work up includes acute on chronic CHF with Afib.  Pt also shows edematous LE's, weeping on the R LE. Cardioversion 9/3.  PMHx:  HOCM, HTN, heart cath, cardioversion   OT comments  Pt seen post cardioversion, pt very fatigued but agreeable to attempt OOB. Pt completing transfers and in room ambulation with CGA and use of RW. Pt CGA for bed mobility and for grooming task at sink. Pt presenting with impairments listed below, will follow acutely. VSS throughout session on supplemental O2. Continue to anticipate no OT follow up needs at d/c.       If plan is discharge home, recommend the following:  Assist for transportation;Assistance with cooking/housework   Equipment Recommendations  None recommended by OT    Recommendations for Other Services      Precautions / Restrictions Precautions Precautions: Fall Restrictions Weight Bearing Restrictions: No       Mobility Bed Mobility Overal bed mobility: Needs Assistance Bed Mobility: Supine to Sit, Sit to Supine     Supine to sit: Contact guard Sit to supine: Contact guard assist        Transfers Overall transfer level: Needs assistance Equipment used: Rolling walker (2 wheels) Transfers: Sit to/from Stand Sit to Stand: Contact guard assist                 Balance Overall balance assessment: Needs assistance Sitting-balance support: No upper extremity supported, Feet supported Sitting balance-Leahy Scale: Fair       Standing balance-Leahy Scale: Poor Standing balance comment: RW in standing                           ADL either performed or assessed with clinical judgement   ADL Overall ADL's : Needs assistance/impaired     Grooming: Oral care;Standing;Contact guard assist Grooming  Details (indicate cue type and reason): brushing teeth standing at sink                 Toilet Transfer: Contact guard assist;Ambulation;Regular Toilet;Rolling walker (2 wheels)           Functional mobility during ADLs: Contact guard assist;Rolling walker (2 wheels)      Extremity/Trunk Assessment Upper Extremity Assessment Upper Extremity Assessment: Generalized weakness   Lower Extremity Assessment Lower Extremity Assessment: Defer to PT evaluation        Vision   Vision Assessment?: No apparent visual deficits Additional Comments: functional for BADL   Perception Perception Perception: Not tested   Praxis Praxis Praxis: Not tested    Cognition Arousal: Alert Behavior During Therapy: Flat affect Overall Cognitive Status: Within Functional Limits for tasks assessed                                          Exercises      Shoulder Instructions       General Comments VSS on supplemental O2    Pertinent Vitals/ Pain       Pain Assessment Pain Assessment: Faces Pain Score: 4  Faces Pain Scale: Hurts little more Pain Location: generalized with movement Pain Descriptors / Indicators: Tightness, Tender Pain Intervention(s): Limited activity within patient's tolerance, Monitored during session, Repositioned  Home  Living                                          Prior Functioning/Environment              Frequency  Min 1X/week        Progress Toward Goals  OT Goals(current goals can now be found in the care plan section)     Acute Rehab OT Goals OT Goal Formulation: With patient Time For Goal Achievement: 09/30/22 Potential to Achieve Goals: Fair ADL Goals Pt Will Perform Grooming: with modified independence;standing Pt Will Perform Lower Body Dressing: with modified independence;sit to/from stand Pt Will Transfer to Toilet: with modified independence;regular height toilet;ambulating  Plan       Co-evaluation                 AM-PAC OT "6 Clicks" Daily Activity     Outcome Measure   Help from another person eating meals?: None Help from another person taking care of personal grooming?: A Little Help from another person toileting, which includes using toliet, bedpan, or urinal?: A Little Help from another person bathing (including washing, rinsing, drying)?: A Little Help from another person to put on and taking off regular upper body clothing?: None Help from another person to put on and taking off regular lower body clothing?: A Little 6 Click Score: 20    End of Session Equipment Utilized During Treatment: Rolling walker (2 wheels)  OT Visit Diagnosis: Unsteadiness on feet (R26.81)   Activity Tolerance Patient tolerated treatment well   Patient Left in bed;with call bell/phone within reach;with bed alarm set   Nurse Communication Mobility status        Time: 1240-1302 OT Time Calculation (min): 22 min  Charges: OT General Charges $OT Visit: 1 Visit OT Treatments $Self Care/Home Management : 8-22 mins  Carver Fila, OTD, OTR/L SecureChat Preferred Acute Rehab [phone]   Chenel Wernli K Koonce 09/20/2022, 1:10 PM

## 2022-09-20 NOTE — Progress Notes (Signed)
   09/19/22 2359  Assess: MEWS Score  Temp 99.1 F (37.3 C)  BP 128/79  MAP (mmHg) 88  ECG Heart Rate (!) 118  Resp 20  Level of Consciousness Alert  SpO2 97 %  O2 Device Nasal Cannula  O2 Flow Rate (L/min) 2 L/min  Assess: MEWS Score  MEWS Temp 0  MEWS Systolic 0  MEWS Pulse 2  MEWS RR 0  MEWS LOC 0  MEWS Score 2  MEWS Score Color Yellow  Assess: if the MEWS score is Yellow or Red  Were vital signs accurate and taken at a resting state? Yes  Does the patient meet 2 or more of the SIRS criteria? No  MEWS guidelines implemented  Yes, yellow  Treat  MEWS Interventions Considered administering scheduled or prn medications/treatments as ordered  Take Vital Signs  Increase Vital Sign Frequency  Yellow: Q2hr x1, continue Q4hrs until patient remains green for 12hrs  Escalate  MEWS: Escalate Yellow: Discuss with charge nurse and consider notifying provider and/or RRT  Notify: Charge Nurse/RN  Name of Charge Nurse/RN Notified [place], RN  Provider Notification  Provider Name/Title Dr. Fortunato Curling  Date Provider Notified 09/20/22  Time Provider Notified 1225  Method of Notification Page (Secured chat)  Notification Reason Other (Comment) (HR up to 130s afib)  Provider response At bedside;See new orders  Date of Provider Response 09/20/22  Time of Provider Response 1235  Assess: SIRS CRITERIA  SIRS Temperature  0  SIRS Pulse 1  SIRS Respirations  0  SIRS WBC 0  SIRS Score Sum  1

## 2022-09-21 ENCOUNTER — Other Ambulatory Visit (HOSPITAL_COMMUNITY): Payer: Self-pay

## 2022-09-21 DIAGNOSIS — I4891 Unspecified atrial fibrillation: Secondary | ICD-10-CM | POA: Diagnosis not present

## 2022-09-21 LAB — ECHO TEE

## 2022-09-21 LAB — CBC
HCT: 27.6 % — ABNORMAL LOW (ref 36.0–46.0)
Hemoglobin: 8.2 g/dL — ABNORMAL LOW (ref 12.0–15.0)
MCH: 24.9 pg — ABNORMAL LOW (ref 26.0–34.0)
MCHC: 29.7 g/dL — ABNORMAL LOW (ref 30.0–36.0)
MCV: 83.9 fL (ref 80.0–100.0)
Platelets: 263 10*3/uL (ref 150–400)
RBC: 3.29 MIL/uL — ABNORMAL LOW (ref 3.87–5.11)
RDW: 18.8 % — ABNORMAL HIGH (ref 11.5–15.5)
WBC: 9 10*3/uL (ref 4.0–10.5)
nRBC: 0 % (ref 0.0–0.2)

## 2022-09-21 LAB — MAGNESIUM: Magnesium: 2.1 mg/dL (ref 1.7–2.4)

## 2022-09-21 LAB — BASIC METABOLIC PANEL
Anion gap: 9 (ref 5–15)
BUN: 18 mg/dL (ref 8–23)
CO2: 19 mmol/L — ABNORMAL LOW (ref 22–32)
Calcium: 9.2 mg/dL (ref 8.9–10.3)
Chloride: 108 mmol/L (ref 98–111)
Creatinine, Ser: 1.32 mg/dL — ABNORMAL HIGH (ref 0.44–1.00)
GFR, Estimated: 44 mL/min — ABNORMAL LOW (ref >60)
Glucose, Bld: 94 mg/dL (ref 70–99)
Potassium: 4.2 mmol/L (ref 3.5–5.1)
Sodium: 136 mmol/L (ref 135–145)

## 2022-09-21 MED ORDER — HYDROXYZINE HCL 10 MG PO TABS
5.0000 mg | ORAL_TABLET | Freq: Once | ORAL | Status: DC
  Filled 2022-09-21: qty 1

## 2022-09-21 MED ORDER — EMPAGLIFLOZIN 10 MG PO TABS: 10.0000 mg | ORAL_TABLET | Freq: Every day | ORAL | Status: DC

## 2022-09-21 MED ORDER — METOPROLOL SUCCINATE ER 25 MG PO TB24
25.0000 mg | ORAL_TABLET | Freq: Every day | ORAL | 0 refills | Status: DC
  Filled 2022-09-21: qty 30, 30d supply, fill #0

## 2022-09-21 MED ORDER — HYDROXYZINE HCL 10 MG PO TABS
10.0000 mg | ORAL_TABLET | Freq: Every day | ORAL | 0 refills | Status: DC
  Filled 2022-09-21: qty 90, 90d supply, fill #0

## 2022-09-21 MED ORDER — EMPAGLIFLOZIN 10 MG PO TABS
10.0000 mg | ORAL_TABLET | Freq: Every day | ORAL | 0 refills | Status: DC
Start: 2022-09-21 — End: 2023-05-11
  Filled 2022-09-21: qty 30, 30d supply, fill #0

## 2022-09-21 MED ORDER — ESCITALOPRAM OXALATE 5 MG PO TABS
5.0000 mg | ORAL_TABLET | Freq: Every day | ORAL | 0 refills | Status: DC
  Filled 2022-09-21: qty 30, 30d supply, fill #0

## 2022-09-21 MED ORDER — ATORVASTATIN CALCIUM 80 MG PO TABS
80.0000 mg | ORAL_TABLET | Freq: Every day | ORAL | 0 refills | Status: DC
  Filled 2022-09-21: qty 30, 30d supply, fill #0

## 2022-09-21 NOTE — Assessment & Plan Note (Deleted)
Patient has been anxious since arrival, due to multiple recent hospital admissions and some stressors in her personal life.  Patient is prescribed hydroxyzine 10 mg 3 times daily at home as needed for anxiety -Discontinued hydroxyzine during the day due to sleepiness, will add back to bedtime regimen -Started Lexapro 5 mg daily -Consult to spiritual care placed at patient's request

## 2022-09-21 NOTE — Progress Notes (Signed)
Heart Failure Navigator Progress Note  Assessed for Heart & Vascular TOC clinic readiness.  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   Navigator will sign off at this time.    Lesley Galentine,RN, BSN,MSN Heart Failure Nurse Navigator. Contact by secure chat only.

## 2022-09-21 NOTE — Progress Notes (Signed)
Patient Name: Christine Jacobson Date of Encounter: 09/21/2022 [facility] Cardiologist: Thomasene Ripple, DO   Interval Summary  .    Patient subjectively feeling better this morning. Reports that her breathing feels like it is back to normal. She also endorses improved mood today.   Vital Signs .    Vitals:   09/20/22 1105 09/20/22 1138 09/20/22 1954 09/21/22 0335  BP: 111/70 123/72 128/88 115/78  Pulse: 87 84 88 88  Resp: 16 16 20 20   Temp:  97.9 F (36.6 C) 98.4 F (36.9 C) 98.6 F (37 C)  TempSrc:  Oral Oral Oral  SpO2: 92% 100% 94% 97%  Weight:    83 kg  Height:        Intake/Output Summary (Last 24 hours) at 09/21/2022 0914 Last data filed at 09/20/2022 1400 Gross per 24 hour  Intake 240 ml  Output --  Net 240 ml      09/21/2022    3:35 AM 09/20/2022    9:03 AM 09/20/2022    4:33 AM  Last 3 Weights  Weight (lbs) 183 lb 194 lb 0.1 oz 195 lb 5.2 oz  Weight (kg) 83.008 kg 88 kg 88.6 kg      Telemetry/ECG    Sinus rhythm with average HR in the 70s - Personally Reviewed  Physical Exam .   GEN: No acute distress.   Neck: No JVD Cardiac: RRR, holosystolic murmur over apex. No rubs, or gallops.  Respiratory: Very faint crackles left lower lobe. Right lung clear GI: Soft, nontender, non-distended  MS: Unna boots in place bilateral LE.  Assessment & Plan .     Atrial fibrillation with RVR   Patient admitted in the setting of significant dyspnea on exertion with recurrent atrial fibrillation. She is now s/p TEE/DCCV with restoration of NSR.    Continue Eliquis 5mg  BID Home Metoprolol Succinate was held earlier this admission with lower BP. Patient now back on lower 25mg  daily dose with stable BP and HR. Continue this dose at discharge.   Hypertrophic cardiomyopathy Acute on Chronic HFpEF Moderate pulmonary hypertension   Patient with symptoms of acute CHF on admission with concern of missed diuretic dosing. TTE this admission with findings suggest HOCM with 75  mmHg continuous mean gradient - valsalva not performed. Left ventricular  ejection fraction, by estimation, is 65 to 70%. Left ventricular ejection  fraction by PLAX is 65 %. Severely elevated pulmonary artery  systolic pressure. The estimated right ventricular systolic pressure is  64.8 mmHg.    Given elevated gradients as well as AKI, aggressive diuresis not pursued. She has unna boots on lower extremities with improvement in edema. Improved pulmonary exam today following DCCV and she appears euvolemic. GDMT limited by AKI. Consider SGLT2 if creatinine stabilizes. Jardiance appears to be more cost effective option at around $47. Given stable volume status this admission despite held loop diuretic, patient may only need this on a PRN basis.  Will arrange close outpatient follow up with APP and request appt with Dr. Izora Ribas for further [facility] evaluation/management per Dr. Servando Salina.      Moderate to Severe MR   TTE this admission showing moderate to severe  mitral valve regurgitation. Mild mitral stenosis. Severe mitral annular  calcification. Previously had grade I MR. Unclear how much of this is a result of afib. Will plan to repeat outpatient TTE. If MR remains severe, would need to consider structural team evaluation.    AKI on CKD   Creatinine improved to  1.32 today with torsemide being held. Creatinine as high as 1.9 this admission. Recent baseline closer to 1.1-1.2.     Hyperlipidemia   LDL not at goal on admission and statin increased. Continue Atorvastatin 80mg  at discharge.   Per primary team: Chronic anemia AKI on CKD  For questions or updates, please contact  [facility] Please consult [email or web address] for contact info under        Signed, Perlie Gold, PA-C

## 2022-09-21 NOTE — Care Management Important Message (Signed)
Important Message  Patient Details  Name: Christine Jacobson MRN: [id number] Date of Birth: 02/02/1955   Medicare Important Message Given:  Yes     Renie Ora 09/21/2022, 9:32 AM

## 2022-09-21 NOTE — Assessment & Plan Note (Deleted)
Hgb 8.4 yesterday from 9.8 on admission.  Baseline hgb appears to be around 7.  Patient required blood transfusions during her last hospital admission, however had a colonoscopy and EGD at that time which showed diverticulosis, internal hemorrhoids, and tubular adenomatous polyp but no source of bleeding. -s/p IV iron infusion -monitor CBC, plan to repeat in am prior to DCCV -transfusion threshold <7

## 2022-09-21 NOTE — Assessment & Plan Note (Deleted)
In the setting of HFpEF. Now euvolemic on exam after initial dose of lasix. BP slightly soft and Cr elevated, hesitant with further diuresis. Echo with EF 65-70%, severe pHTN, MR. -continue Midodrine 5mg  BID -Cardiology following, appreciate recs -Consider SGLT2 as renal function improves -Daily weights, strict I/Os, no output recorded -TSH 0.560 WNL

## 2022-09-21 NOTE — Assessment & Plan Note (Deleted)
Did have tachycardia 100-126 throughout the night, now rate controlled at 92. Notably pt was feeling very anxious last night and did not sleep much.  - Appreciate cardiology, plan for TEE/DCCV on Tues 9/3 - Heart healthy diet, NPO 9/3 midnight - Continued home Eliquis, pt states she has been taking this regularly - have been holding home metoprolol due to hypotension, consider adding back if continues to be tachycardic

## 2022-09-21 NOTE — Discharge Summary (Addendum)
Family Medicine Teaching [facility] Discharge Summary  Patient name: Christine Jacobson Medical record number: [id number] Date of birth: 1955/12/19 Age: 67 y.o. Gender: female Date of Admission: 09/15/2022  Date of Discharge: 09/21/22 Admitting Physician: Para March, DO  Primary Care Provider: Karenann Cai, NP Consultants: cardiology  Indication for Hospitalization: dyspnea  Discharge Diagnoses/Problem List:  Principal Problem for Admission: atrial fibrillation Other Problems addressed during stay:  Principal Problem:   Atrial fibrillation by electrocardiogram [facility]) Active Problems:   HOCM (hypertrophic obstructive cardiomyopathy) (HCC)   AKI (acute kidney injury) (HCC)   Normocytic anemia   Acute exacerbation of CHF (congestive heart failure) (HCC)   Leg swelling   Anxiety    Brief Hospital Course:  Christine Jacobson is a 67 y.o.female with a history of CHF, Afib-on Eliquis who was admitted to the [facility] Teaching Service at [facility] for dyspnea. Her hospital course is detailed below:  Dyspnea  Heart Failure Exacerbation Patient presented initially from cardiology A-fib clinic due to increased shortness of breath. She was instructed to come to the [facility] by cardiology for concern of volume overload. CXR with chronic changes, nothing new. BNP in the 400s, which is lower than it was during previous exacerbations.  Patient received initial 40 mg Lasix dose but was not further diuresed due to hypotension.  Patient did not appear volume overloaded on her exam.   Afib  Patient had DCCV/TEE on 9/3, she converted to NSR shortly after. TEE with no acute changes.   Chronic leg swelling Patient had chronic bilateral lower extremity swelling with venous stasis changes.  Wound care followed patient while in the hospital.  Unna boots were placed and changed.  Home health is to come out and continue to change patient's Unna boots on Mondays and Thursdays.   Normocytic anemia Patient is  hemoglobin decreased from 9.8 to 8.4.  Her baseline appears around 7.  She did not require blood transfusions during this admission.  She did receive 1 IV iron infusion.  Anxiety Started patient on Lexapro 5 mg daily for anxiety.  She was prescribed hydroxyzine 10 mg 3 times daily as needed at home, however stated that she was not taking this regularly prior to arrival.  She received 1 dose at bedtime while in the hospital.  AKI Baseline Cr a little over 1, Cr was 1.9 on arrival and then decreased to 1.57 at discharge. Suspect this to be due to decreased fluid intake and initial diuresis.    Other chronic conditions were medically managed with home medications and formulary alternatives as necessary: HLD - increased atorvastatin to 80mg  per cards   PCP Follow-up Recommendations: Recommend pulm referral on discharge - chronic interstitial changes on XR Recommend f/u on anxiety and adjust SSRI as needed Make sure Unna boots being changed M/Th Consider addition of SGLT2  Disposition: home  Discharge Condition: stable, Dr. Servando Salina agrees with discharge and states that she will follow-up with patient in her clinic  Discharge Exam:  Vitals:   09/20/22 1954 09/21/22 0335  BP: 128/88 115/78  Pulse: 88 88  Resp: 20 20  Temp: 98.4 F (36.9 C) 98.6 F (37 C)  SpO2: 94% 97%   Gen: No acute distress, resting comfortably CV: RRR, systolic murmur unchanged from previous Resp: CTAB, no increased work of breathing MSK: legs wrapped in Unna boots  Significant Procedures: TEE, DCCV with conversion to NSR  Significant Labs and Imaging:  Recent Labs  Lab 09/20/22 0450 09/21/22 0450  WBC 10.0 9.0  HGB  8.4* 8.2*  HCT 26.8* 27.6*  PLT 262 263   Recent Labs  Lab 09/20/22 0450 09/21/22 0450  NA 135 136  K 4.2 4.2  CL 109 108  CO2 19* 19*  GLUCOSE 127* 94  BUN 20 18  CREATININE 1.57* 1.32*  CALCIUM 9.2 9.2  MG 1.9 2.1   Chest XR 09/15/22 IMPRESSION: Chronic interstitial coarsening  without acute cardiopulmonary findings.  TEE 09/21/22 LEFT VENTRICLE: EF = 70% Severe asymmetric septal hypertrophy c/w HOCM. LVOT gradient not measured  RIGHT VENTRICLE: Low normal function   LEFT ATRIUM: Massively dilated   LEFT ATRIAL APPENDAGE: No clot   RIGHT ATRIUM: Moderately dilated   AORTIC VALVE:  Trileaflet. Mildly calcified. No AI   MITRAL VALVE:    + severe MAC with posterior leaflet restriction Severe MR   TRICUSPID VALVE: Normal Moderate TR   PULMONIC VALVE: Normal. Mild PI   INTERATRIAL SEPTUM: No PFO/ASD   PERICARDIUM: No significant effusion   DESCENDING AORTA: Not well visualized  Results/Tests Pending at Time of Discharge: none  Discharge Medications:  Allergies as of 09/21/2022       Reactions   Pork-derived Products Other (See Comments)   Religious preference        Medication List     STOP taking these medications    potassium chloride SA 20 MEQ tablet Commonly known as: KLOR-CON M   torsemide 20 MG tablet Commonly known as: DEMADEX       TAKE these medications    apixaban 5 MG Tabs tablet Commonly known as: Eliquis Take 1 tablet (5 mg total) by mouth 2 (two) times daily.   ascorbic acid 500 MG tablet Commonly known as: VITAMIN C Take 1 tablet (500 mg total) by mouth daily for 30 days   atorvastatin 80 MG tablet Commonly known as: LIPITOR Take 1 tablet (80 mg total) by mouth daily. Start taking on: September 22, 2022 What changed:  medication strength how much to take   escitalopram 5 MG tablet Commonly known as: LEXAPRO Take 1 tablet (5 mg total) by mouth daily. Start taking on: September 22, 2022   hydrOXYzine 10 MG tablet Commonly known as: ATARAX Take 1 tablet (10 mg total) by mouth at bedtime. What changed:  when to take this reasons to take this   Jardiance 10 MG Tabs tablet Generic drug: empagliflozin Take 1 tablet (10 mg total) by mouth daily.   metoprolol succinate 25 MG 24 hr tablet Commonly known as:  TOPROL-XL Take 1 tablet (25 mg total) by mouth daily. Start taking on: September 22, 2022 What changed:  medication strength how much to take when to take this   midodrine 5 MG tablet Commonly known as: PROAMATINE Take 1 tablet (5 mg total) by mouth 2 (two) times daily.   mineral oil-hydrophilic petrolatum ointment Apply topically as needed for dry skin. Use with Unna boots please at the back of the wounds on the lower extremities when the Unna boots are changed   pantoprazole 40 MG tablet Commonly known as: PROTONIX Take 1 tablet (40 mg total) by mouth daily.   Zinc Sulfate 220 (50 Zn) MG Tabs Take 1 tablet (220 mg total) by mouth daily for 30 days               Discharge Care Instructions  (From admission, onward)           Start     Ordered   09/21/22 0000  Discharge wound care:  Comments: 1. Clean R leg open ulcers with NS, apply silver hydrofiber [facility] [place] [phone]) to wound beds before application of unna boots Monday and Thursday.   2.No wound care needed to left leg.   09/21/22 7846            Discharge Instructions: Please refer to Patient Instructions section of EMR for full details.  Patient was counseled important signs and symptoms that should prompt return to medical care, changes in medications, dietary instructions, activity restrictions, and follow up appointments.   Follow-Up Appointments:  Follow-up Information     Care, [facility] Follow up.   Specialty: Home Health Services Why: Registered Nurse- wound care-office to call with visit times. Contact information: [address] [place] [address] [phone]         [facility]. Schedule an appointment as soon as possible for a visit.   Contact information: [address] [place] [address] [phone]               Lincoln Brigham, MD 09/21/2022, 12:24 PM PGY-2, [facility]

## 2022-09-21 NOTE — TOC Benefit Eligibility Note (Signed)
Patient Product/process development scientist completed.    The patient is insured through [employer]. Patient has Medicare and is not eligible for a copay card, but may be able to apply for patient assistance, if available.    Ran test claim for Farxiga 10 mg and the current 30 day co-pay is $99.00.  Ran test claim for Jardiance 10 mg and the current 30 day co-pay is $47.00.  This test claim was processed through [facility]- copay amounts may vary at other pharmacies due to pharmacy/plan contracts, or as the patient moves through the different stages of their insurance plan.     Roland Earl, CPHT Pharmacy Technician III Certified Patient Advocate [facility] Patient Advocate Team Direct Number: [phone]  Fax: [phone]

## 2022-09-21 NOTE — Assessment & Plan Note (Deleted)
Suspect this could be contributing to her dyspnea.  Could be due to inadequate control of afib. Patient is rate controlled, but per cardiology could benefit from rhythm control as well likely due to HOCM. Initial CXR without pleural effusions, but does have cardiomegaly. Does not appear profusely volume overloaded on exam and BNP elevated though not her max. Additionally has chronic bilateral lower extremity edema. -Given Lasix 40 mg in the ED. Additional Lasix has been held since admission due to hypotension and pt does have a AKI -home Midodrine 5mg  BID -Cardiology following, appreciate recs -Daily weights -TSH 0.560 WNL

## 2022-09-21 NOTE — Assessment & Plan Note (Deleted)
Most recent creatinine 1.57.  Appears her baseline is a little over 1.  -Continue to hold additional Lasix diuresis -Caution with nephrotoxic agents -BMP am

## 2022-09-21 NOTE — Assessment & Plan Note (Deleted)
Chronic BLE swelling with venous stasis changes. - low suspicion for cellulitis, no significant warmth, drainage, afebrile - low suspicion for dvt, pt reliably takes eliquis, no asymmetric leg swelling or calf tenderness - wound care consulted, appreciate assistance with this patient - unna boot to be replaced today

## 2022-09-22 ENCOUNTER — Ambulatory Visit (HOSPITAL_COMMUNITY): Payer: Medicare PPO

## 2022-09-29 ENCOUNTER — Emergency Department (HOSPITAL_COMMUNITY): Payer: Medicare PPO

## 2022-09-29 ENCOUNTER — Other Ambulatory Visit: Payer: Self-pay

## 2022-09-29 ENCOUNTER — Encounter (HOSPITAL_COMMUNITY): Payer: Self-pay

## 2022-09-29 ENCOUNTER — Emergency Department (HOSPITAL_COMMUNITY)
Admission: EM | Admit: 2022-09-29 | Discharge: 2022-09-30 | Disposition: A | Discharge status: Home / Self Care | Payer: Medicare PPO | Attending: Emergency Medicine | Admitting: Emergency Medicine

## 2022-09-29 DIAGNOSIS — Z79899 Other long term (current) drug therapy: Secondary | ICD-10-CM | POA: Insufficient documentation

## 2022-09-29 DIAGNOSIS — I10 Essential (primary) hypertension: Secondary | ICD-10-CM | POA: Insufficient documentation

## 2022-09-29 DIAGNOSIS — F419 Anxiety disorder, unspecified: Secondary | ICD-10-CM | POA: Diagnosis present

## 2022-09-29 DIAGNOSIS — E876 Hypokalemia: Secondary | ICD-10-CM

## 2022-09-29 DIAGNOSIS — R42 Dizziness and giddiness: Secondary | ICD-10-CM

## 2022-09-29 DIAGNOSIS — F332 Major depressive disorder, recurrent severe without psychotic features: Secondary | ICD-10-CM | POA: Diagnosis present

## 2022-09-29 DIAGNOSIS — F329 Major depressive disorder, single episode, unspecified: Secondary | ICD-10-CM | POA: Diagnosis not present

## 2022-09-29 DIAGNOSIS — R600 Localized edema: Secondary | ICD-10-CM | POA: Diagnosis not present

## 2022-09-29 DIAGNOSIS — F32A Depression, unspecified: Secondary | ICD-10-CM | POA: Diagnosis present

## 2022-09-29 DIAGNOSIS — Z7901 Long term (current) use of anticoagulants: Secondary | ICD-10-CM | POA: Diagnosis not present

## 2022-09-29 DIAGNOSIS — Z7689 Persons encountering health services in other specified circumstances: Secondary | ICD-10-CM

## 2022-09-29 LAB — TROPONIN I (HIGH SENSITIVITY)
Troponin I (High Sensitivity): 11 ng/L (ref <18)
Troponin I (High Sensitivity): 10 ng/L (ref <18)

## 2022-09-29 LAB — BASIC METABOLIC PANEL
Anion gap: 7 (ref 5–15)
BUN: 13 mg/dL (ref 8–23)
CO2: 20 mmol/L — ABNORMAL LOW (ref 22–32)
Calcium: 8.1 mg/dL — ABNORMAL LOW (ref 8.9–10.3)
Chloride: 113 mmol/L — ABNORMAL HIGH (ref 98–111)
Creatinine, Ser: 0.81 mg/dL (ref 0.44–1.00)
GFR, Estimated: >60 mL/min (ref >60)
Glucose, Bld: 79 mg/dL (ref 70–99)
Potassium: 2.8 mmol/L — ABNORMAL LOW (ref 3.5–5.1)
Sodium: 140 mmol/L (ref 135–145)

## 2022-09-29 LAB — CBC
HCT: 28.6 % — ABNORMAL LOW (ref 36.0–46.0)
Hemoglobin: 8.6 g/dL — ABNORMAL LOW (ref 12.0–15.0)
MCH: 25.6 pg — ABNORMAL LOW (ref 26.0–34.0)
MCHC: 30.1 g/dL (ref 30.0–36.0)
MCV: 85.1 fL (ref 80.0–100.0)
Platelets: 259 10*3/uL (ref 150–400)
RBC: 3.36 MIL/uL — ABNORMAL LOW (ref 3.87–5.11)
RDW: 19.5 % — ABNORMAL HIGH (ref 11.5–15.5)
WBC: 7.5 10*3/uL (ref 4.0–10.5)
nRBC: 0 % (ref 0.0–0.2)

## 2022-09-29 LAB — SALICYLATE LEVEL: Salicylate Lvl: <7 mg/dL — ABNORMAL LOW (ref 7.0–30.0)

## 2022-09-29 LAB — ACETAMINOPHEN LEVEL: Acetaminophen (Tylenol), Serum: <10 ug/mL — ABNORMAL LOW (ref 10–30)

## 2022-09-29 LAB — ETHANOL: Alcohol, Ethyl (B): <10 mg/dL (ref <10)

## 2022-09-29 LAB — BRAIN NATRIURETIC PEPTIDE: B Natriuretic Peptide: 538.7 pg/mL — ABNORMAL HIGH (ref 0.0–100.0)

## 2022-09-29 MED ORDER — HYDROXYZINE HCL 10 MG PO TABS
10.0000 mg | ORAL_TABLET | Freq: Two times a day (BID) | ORAL | Status: DC
  Administered 2022-09-29 – 2022-09-30 (×2): 10 mg via ORAL
  Filled 2022-09-29 (×2): qty 1

## 2022-09-29 MED ORDER — POTASSIUM CHLORIDE 10 MEQ/100ML IV SOLN
10.0000 meq | Freq: Once | INTRAVENOUS | Status: AC
  Administered 2022-09-29: 10 meq via INTRAVENOUS
  Filled 2022-09-29: qty 100

## 2022-09-29 MED ORDER — ESCITALOPRAM OXALATE 10 MG PO TABS
10.0000 mg | ORAL_TABLET | Freq: Every day | ORAL | Status: DC
  Administered 2022-09-29 – 2022-09-30 (×2): 10 mg via ORAL
  Filled 2022-09-29 (×2): qty 1

## 2022-09-29 MED ORDER — FUROSEMIDE 10 MG/ML IJ SOLN: 30.0000 mg | Freq: Once | INTRAMUSCULAR | Status: DC

## 2022-09-29 MED ORDER — FUROSEMIDE 40 MG PO TABS
20.0000 mg | ORAL_TABLET | Freq: Once | ORAL | Status: AC
  Administered 2022-09-29: 20 mg via ORAL
  Filled 2022-09-29: qty 1

## 2022-09-29 MED ORDER — POTASSIUM CHLORIDE CRYS ER 20 MEQ PO TBCR
60.0000 meq | EXTENDED_RELEASE_TABLET | Freq: Once | ORAL | Status: AC
  Administered 2022-09-29: 60 meq via ORAL
  Filled 2022-09-29: qty 3

## 2022-09-29 NOTE — ED Triage Notes (Signed)
BIBA from an appointment at [facility] for depression, psych eval. Pt denies SI/HI. While on the way here, pt became dizzy and had an episode of sweating.Hx of Afib. 300cc NS given PTA. 110/73 BP 63 HR 97% room air 122 cbg

## 2022-09-29 NOTE — ED Notes (Signed)
One bag of patient belongings was placed in locker 27.

## 2022-09-29 NOTE — Consult Note (Signed)
BH ED ASSESSMENT   Reason for Consult: Psych Consult Referring Physician: Delice Bison, PA-C Patient Identification: Christine Jacobson MRN:  [id number] ED Chief Complaint: MDD (major depressive disorder)  Diagnosis:  Principal Problem:   MDD (major depressive disorder) Active Problems:   Anxiety   ED Assessment Time Calculation: Start Time: 1500 Stop Time: 1540 Total Time in Minutes (Assessment Completion): 40   Subjective:   Christine Jacobson is a 67 y.o. female patient with medical history of acquired thrombophilia, shortness of breath, hokum, chest pain, hypertension, liver function test abnormality, palpitations, atrial fibrillation on Eliquis, cellulitis of bilateral lower extremities. The patient presents to the ED for evaluation.    HPI:  Christine Jacobson, 67 y.o., female patient seen face to face by this provider, consulted with Dr. Lucianne Muss; and chart reviewed on 09/29/22.  On evaluation Christine Jacobson reports she is here because of her depression states that she went to see her PCP today and her PCP felt that her depression is worse than usual.  She states that she has an RN home health caregiver who comes to her house and helps her, states that she told the caregiver once that she was having thoughts of suicide, says it was not recently that she said that but says that her brother passed away about a week ago as she had a neighbor who committed suicide about 3 weeks ago.  She rates her anxiety and depression currently 7/10 with 10 being severe.  She currently denies SI/HI/AVH and says she knows she would not even have the courage to commit suicide.  She states that she lives alone, has never been married and has no children and has been thinking more about her life and if she will die alone, says that she has been sad that she may not have anyone with her when she dies.  She states that she has been crying more, decreased sleep, says she has a hard time falling asleep and  staying asleep, eats 1 meal a day, watches TV a lot.  She does say that she has some supportive friends who live nearby, and walking distance and whenever she needs them she knows that she can call on them.  She states that she does take Lexapro and Atarax, does not feel like her medication is helping her.  Denies using any drugs or alcohol, UDS is needed.  She states she also recently has been catfish, she would not elaborate on what happened or who the president is but says she has been given this person money for the last 3 years, and the person has still been trying to contact her, she states that she wants to sever all ties with this person. Patient states that she was going to outpatient therapy with [facility], she is unsure of the last time she has seen the outpatient therapist.  Per chart review it looks like patient's last visit was February 2024.  During evaluation Christine Jacobson is laying in hospital bed, eating her sandwich in no acute distress. She is alert, oriented x 3, calm, cooperative and attentive. Her mood is euthymic with congruent affect. She has normal speech, and behavior.  Objectively there is no evidence of psychosis/mania or delusional thinking.  Patient is able to converse coherently, goal directed thoughts, no distractibility, or pre-occupation. She denies suicidal/self-harm/homicidal ideation, psychosis, and paranoia.    Attempted to call patient friend Donnetta Hutching, no answer, we will continue to keep trying to  ensure patient safety, upon returning home.    Past Psychiatric History: MDD without psychosis  Risk to Self or Others: Risk to Self:  No Risk to Others:  No  Prior Inpatient Therapy:  Yes  Prior Outpatient Therapy: Yes ( [facility] 2/24) [place] Scale:  Flowsheet Row ED from 09/29/2022 in [facility] Emergency Department at [facility] ED to Hosp-Admission (Discharged) from 09/15/2022 in  [facility] ED to Hosp-Admission (Discharged) from 08/04/2022 in [facility] [facility]  C-SSRS RISK CATEGORY No Risk No Risk No Risk       AIMS:  , , ,  ,   ASAM:    Substance Abuse:     Past Medical History:  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   2D ECHO - EF >55%, normal   HTN (hypertension)    Hyperprolactinemia (HCC)    dx in her 18, took meds, self d/c a while back   Liver function test abnormality    normal when repeated   Mild hyperlipidemia    Obesity    Palpitations    negative stress echo in November 2012 with normal LV function; mild LVH, proximal septal thickening with narrow LVOT and mild gradient; mild MR and TR; Cardionet showed PACs in November 2012   Pyoderma gangrenosa    Shortness of breath 07/11/2011   MET TEST    Past Surgical History:  Procedure Laterality Date   BIOPSY  08/10/2022   Procedure: BIOPSY;  Surgeon: Willis Modena, MD;  Location: [facility];  Service: Gastroenterology;;   CARDIOVERSION N/A 02/01/2022   Procedure: CARDIOVERSION;  Surgeon: Thomasene Ripple, DO;  Location: [facility] END[facility];  Service: Cardiovascular;  Laterality: N/A;   CARDIOVERSION N/A 09/20/2022   Procedure: CARDIOVERSION;  Surgeon: Dolores Patty, MD;  Location: [facility] INVASIVE CV LAB;  Service: Cardiovascular;  Laterality: N/A;   COLONOSCOPY WITH PROPOFOL Left 08/10/2022   Procedure: COLONOSCOPY WITH PROPOFOL;  Surgeon: Willis Modena, MD;  Location: [facility] ENDOS[facility];  Service: Gastroenterology;  Laterality: Left;   ESOPHAGOGASTRODUODENOSCOPY (EGD) WITH PROPOFOL Left 08/10/2022   Procedure: ESOPHAGOGASTRODUODENOSCOPY (EGD) WITH PROPOFOL;  Surgeon: Willis Modena, MD;  Location: [facility];  Service: Gastroenterology;  Laterality:  Left;   POLYPECTOMY  08/10/2022   Procedure: POLYPECTOMY;  Surgeon: Willis Modena, MD;  Location: [facility];  Service: Gastroenterology;;   RIGHT HEART CATH N/A 10/15/2021   Procedure: RIGHT HEART CATH;  Surgeon: Elder Negus, MD;  Location: [facility] CV [facility];  Service: Cardiovascular;  Laterality: N/A;   SKIN GRAFT  2006   porcine, R leg   TEE WITHOUT CARDIOVERSION N/A 09/20/2022   Procedure: TRANSESOPHAGEAL ECHOCARDIOGRAM;  Surgeon: Dolores Patty, MD;  Location: [facility] CV [facility];  Service: Cardiovascular;  Laterality: N/A;   Family History:  Family History  Problem Relation Age of Onset   Heart disease Mother        endocarditis   Ovarian cancer Mother    Emphysema Father    Coronary artery disease Other        F in his 58   Diabetes Other        GP   Cancer Neg Hx     Social History:  Social History   Substance and Sexual Activity  Alcohol  Use Not Currently   Alcohol/week: 1.0 standard drink of alcohol   Types: 1 Glasses of wine per week   Comment: "I do have my wine" every day, has cut down from 2 bottles at a time, currently 2-3 glasses     Social History   Substance and Sexual Activity  Drug Use No    Social History   Socioeconomic History   Marital status: Single    Spouse name: Not on file   Number of children: 0   Years of education: Not on file   Highest education level: Not on file  Occupational History   Occupation: para Psychologist, forensic, part time cook  Tobacco Use   Smoking status: Former    Current packs/day: 1.00    Average packs/day: 1 pack/day for 4.0 years (4.0 ttl pk-yrs)    Types: Cigarettes   Smokeless tobacco: Never  Vaping Use   Vaping status: Never Used  Substance and Sexual Activity   Alcohol use: Not Currently    Alcohol/week: 1.0 standard drink of alcohol    Types: 1 Glasses of wine per week    Comment: "I do have my wine" every day, has cut down from 2 bottles at a time, currently 2-3 glasses   Drug use: No    Sexual activity: Not Currently  Other Topics Concern   Not on file  Social History Narrative   Lives by self   Social Determinants of Health   Financial Resource Strain: Low Risk  (12/03/2021)   Overall Financial Resource Strain (CARDIA)    Difficulty of Paying Living Expenses: Not very hard  Food Insecurity: No Food Insecurity (09/15/2022)   Hunger Vital Sign    Worried About Running Out of Food in the Last Year: Never true    Ran Out of Food in the Last Year: Never true  Transportation Needs: No Transportation Needs (09/15/2022)   PRAPARE - Administrator, Civil Service (Medical): No    Lack of Transportation (Non-Medical): No  Physical Activity: Not on file  Stress: Not on file  Social Connections: Not on file      Allergies:   Allergies  Allergen Reactions   Pork-Derived Products Other (See Comments)    Religious preference    Labs:  Results for orders placed or performed during the hospital encounter of 09/29/22 (from the past 48 hour(s))  Basic metabolic panel     Status: Abnormal   Collection Time: 09/29/22  1:18 PM  Result Value Ref Range   Sodium 140 135 - 145 mmol/L   Potassium 2.8 (L) 3.5 - 5.1 mmol/L   Chloride 113 (H) 98 - 111 mmol/L   CO2 20 (L) 22 - 32 mmol/L   Glucose, Bld 79 70 - 99 mg/dL    Comment: Glucose reference range applies only to samples taken after fasting for at least 8 hours.   BUN 13 8 - 23 mg/dL   Creatinine, Ser 6.04 0.44 - 1.00 mg/dL   Calcium 8.1 (L) 8.9 - 10.3 mg/dL   GFR, Estimated >54 >09 mL/min    Comment: (NOTE) Calculated using the CKD-EPI Creatinine Equation (2021)    Anion gap 7 5 - 15    Comment: Performed at [facility], [address], [place], [place] [address]  CBC     Status: Abnormal   Collection Time: 09/29/22  1:18 PM  Result Value Ref Range   WBC 7.5 4.0 - 10.5 K/uL   RBC 3.36 (L) 3.87 -  5.11 MIL/uL   Hemoglobin 8.6 (L) 12.0 - 15.0 g/dL   HCT 16.1 (L) 09.6 - 04.5 %   MCV 85.1  80.0 - 100.0 fL   MCH 25.6 (L) 26.0 - 34.0 pg   MCHC 30.1 30.0 - 36.0 g/dL   RDW 40.9 (H) 81.1 - 91.4 %   Platelets 259 150 - 400 K/uL   nRBC 0.0 0.0 - 0.2 %    Comment: Performed at [facility], [address], [place], [place] [address]  Troponin I (High Sensitivity)     Status: None   Collection Time: 09/29/22  1:18 PM  Result Value Ref Range   Troponin I (High Sensitivity) 11 <18 ng/L    Comment: (NOTE) Elevated high sensitivity troponin I (hsTnI) values and significant  changes across serial measurements may suggest ACS but many other  chronic and acute conditions are known to elevate hsTnI results.  Refer to the "Links" section for chest pain algorithms and additional  guidance. Performed at [facility], [address] [place], [place] [address]   Brain natriuretic peptide     Status: Abnormal   Collection Time: 09/29/22  1:18 PM  Result Value Ref Range   B Natriuretic Peptide 538.7 (H) 0.0 - 100.0 pg/mL    Comment: Performed at [facility], [address], [place], [place] [address]  Salicylate level     Status: Abnormal   Collection Time: 09/29/22  1:18 PM  Result Value Ref Range   Salicylate Lvl <7.0 (L) 7.0 - 30.0 mg/dL    Comment: Performed at [facility], [address] [place], [place] [address]  Ethanol     Status: None   Collection Time: 09/29/22  1:18 PM  Result Value Ref Range   Alcohol, Ethyl (B) <10 <10 mg/dL    Comment: (NOTE) Lowest detectable limit for serum alcohol is 10 mg/dL.  For medical purposes only. Performed at [facility], [address] [place], [place] [address]   Acetaminophen level     Status: Abnormal   Collection Time: 09/29/22  1:18 PM  Result Value Ref Range   Acetaminophen (Tylenol), Serum <10 (L) 10 - 30 ug/mL    Comment: (NOTE) Therapeutic concentrations vary significantly. A range of 10-30 ug/mL  may be an effective  concentration for many patients. However, some  are best treated at concentrations outside of this range. Acetaminophen concentrations >150 ug/mL at 4 hours after ingestion  and >50 ug/mL at 12 hours after ingestion are often associated with  toxic reactions.  Performed at [facility], [address], [place], [place] [address]   Troponin I (High Sensitivity)     Status: None   Collection Time: 09/29/22  3:26 PM  Result Value Ref Range   Troponin I (High Sensitivity) 10 <18 ng/L    Comment: (NOTE) Elevated high sensitivity troponin I (hsTnI) values and significant  changes across serial measurements may suggest ACS but many other  chronic and acute conditions are known to elevate hsTnI results.  Refer to the "Links" section for chest pain algorithms and additional  guidance. Performed at [facility], [address], [place], [place] [address]     No current facility-administered medications for this encounter.   Current Outpatient Medications  Medication Sig Dispense Refill   apixaban (ELIQUIS) 5 MG TABS tablet Take 1 tablet (5 mg total) by mouth 2 (two) times daily. 60 tablet 5   ascorbic acid (  VITAMIN C) 500 MG tablet Take 1 tablet (500 mg total) by mouth daily for 30 days 100 tablet 0   atorvastatin (LIPITOR) 80 MG tablet Take 1 tablet (80 mg total) by mouth daily. 30 tablet 0   empagliflozin (JARDIANCE) 10 MG TABS tablet Take 1 tablet (10 mg total) by mouth daily. 30 tablet 0   escitalopram (LEXAPRO) 5 MG tablet Take 1 tablet (5 mg total) by mouth daily. 30 tablet 0   hydrOXYzine (ATARAX) 10 MG tablet Take 1 tablet (10 mg total) by mouth at bedtime. 90 tablet 0   metoprolol succinate (TOPROL-XL) 25 MG 24 hr tablet Take 1 tablet (25 mg total) by mouth daily. 30 tablet 0   midodrine (PROAMATINE) 5 MG tablet Take 1 tablet (5 mg total) by mouth 2 (two) times daily. 180 tablet 3   mineral oil-hydrophilic petrolatum (AQUAPHOR) ointment Apply  topically as needed for dry skin. Use with Unna boots please at the back of the wounds on the lower extremities when the Unna boots are changed (Patient not taking: Reported on 09/15/2022) 420 g 0   pantoprazole (PROTONIX) 40 MG tablet Take 1 tablet (40 mg total) by mouth daily. 90 tablet 3   Zinc Sulfate 220 (50 Zn) MG TABS Take 1 tablet (220 mg total) by mouth daily for 30 days 100 tablet 0    Musculoskeletal: Strength & Muscle Tone: within normal limits Gait & Station: normal Patient leans: N/A   Psychiatric Specialty Exam: Presentation  General Appearance:  Appropriate for Environment  Eye Contact: Good  Speech: Clear and Coherent  Speech Volume: Normal  Handedness: Right   Mood and Affect  Mood: Anxious  Affect: Congruent   Thought Process  Thought Processes: Coherent  Descriptions of Associations:Intact  Orientation:Full (Time, Place and Person)  Thought Content:Logical  History of Schizophrenia/Schizoaffective disorder:No data recorded Duration of Psychotic Symptoms:No data recorded Hallucinations:No data recorded Ideas of Reference:None  Suicidal Thoughts:No data recorded Homicidal Thoughts:No data recorded  Sensorium  Memory: Immediate Good; Recent Good; Remote Good  Judgment: Fair  Insight: Fair   Art therapist  Concentration: Fair  Attention Span: Good  Recall: Good  Fund of Knowledge: Good  Language: Good   Psychomotor Activity  Psychomotor Activity:No data recorded  Assets  Assets: Desire for Improvement; Communication Skills; Intimacy    Sleep  Sleep: Fair   Physical Exam: Physical Exam Vitals and nursing note reviewed. Exam conducted with a chaperone present.  Neurological:     Mental Status: She is alert.  Psychiatric:        Attention and Perception: Attention normal.        Mood and Affect: Mood is depressed.        Speech: Speech normal.        Behavior: Behavior is cooperative.         Thought Content: Thought content normal.        Cognition and Memory: Memory normal.        Judgment: Judgment normal.    Review of Systems  Constitutional: Negative.   Psychiatric/Behavioral:  Positive for depression.    Blood pressure 107/60, pulse 61, temperature 97.9 F (36.6 C), temperature source Oral, resp. rate 17, SpO2 98%. There is no height or weight on file to calculate BMI.    Medical Decision Making: Pt is recommended for overnight observation; she does not need inpatient admission criteria but additional collateral is needed from her friend Donnetta Hutching prior to discharge.  Will message IOP team to see if  patient meet criteria to receive IOP therapy.   Problem 1: MDD: Increase Lexapro from 5 mg to 10 mg daily      Eliana Lueth MOTLEY-MANGRUM, PMHNP 09/29/2022 7:38 PM

## 2022-09-29 NOTE — ED Provider Notes (Signed)
[place] [facility] AT [facility] Provider Note   CSN: [id number] Arrival date & time: 09/29/22  1204     History  Chief Complaint  Patient presents with   Depression   Dizziness    Christine Jacobson is a 67 y.o. female with medical history of acquired thrombophilia, shortness of breath, hokum, chest pain, hypertension, liver function test abnormality, palpitations, atrial fibrillation on Eliquis, cellulitis of bilateral lower extremities.  The patient presents to the ED for evaluation.  The patient reports that she was seen at her PCPs office today for a "discharge visit".  At  health.  She reports that she expressed depression to her PCP and they became concerned.  The patient reports that her brother has died in the last 1 week and her neighbor committed suicide 3 weeks ago.  She denies SI, HI, AVH however she does state that she is very depressed, very sad and feels hopeless.  She states that she also woke up this morning with an episode of dizziness which she believes is related to her A-fib.  She reports that she has A-fib which she is not sure if it is well-controlled.  She endorses compliance on her Eliquis.  She states that she has had shortness of breath, lightheadedness, dizziness, weakness, palpitations and leg swelling for the last 1 day.  She endorses history of bilateral lower extremity cellulitis, patient arrives with her bilateral lower extremities wrapped in compressive stockings.  She states that she was taken off of her Lasix recently but she cannot tell me why or how long she has been off of it for.  She reports that she was recently discharged from the hospital on 9/4, that is confirmed on chart review.  Patient was admitted for dyspnea as a result of A-fib and CHF exacerbation.  Patient had unremarkable hospital course and was discharged on 9/4.  She denies nausea, vomiting abdominal pain, back pain, dysuria, fevers, SI, HI, AVH.  She states that she  is on a antidepressant medication.   Depression Associated symptoms include shortness of breath. Pertinent negatives include no chest pain.  Dizziness Associated symptoms: palpitations and shortness of breath   Associated symptoms: no chest pain and no weakness        Home Medications Prior to Admission medications   Medication Sig Start Date End Date Taking? Authorizing Provider  apixaban (ELIQUIS) 5 MG TABS tablet Take 1 tablet (5 mg total) by mouth 2 (two) times daily. 02/23/22   Tobb, Kardie, DO  ascorbic acid (VITAMIN C) 500 MG tablet Take 1 tablet (500 mg total) by mouth daily for 30 days 06/10/22   Leroy Sea, MD  atorvastatin (LIPITOR) 80 MG tablet Take 1 tablet (80 mg total) by mouth daily. 09/22/22   Alfredo Martinez, MD  empagliflozin (JARDIANCE) 10 MG TABS tablet Take 1 tablet (10 mg total) by mouth daily. 09/21/22   Everhart, Kirstie, DO  escitalopram (LEXAPRO) 5 MG tablet Take 1 tablet (5 mg total) by mouth daily. 09/22/22   Alfredo Martinez, MD  hydrOXYzine (ATARAX) 10 MG tablet Take 1 tablet (10 mg total) by mouth at bedtime. 09/21/22   Alfredo Martinez, MD  metoprolol succinate (TOPROL-XL) 25 MG 24 hr tablet Take 1 tablet (25 mg total) by mouth daily. 09/22/22   Alfredo Martinez, MD  midodrine (PROAMATINE) 5 MG tablet Take 1 tablet (5 mg total) by mouth 2 (two) times daily. 08/19/22     mineral oil-hydrophilic petrolatum (AQUAPHOR) ointment Apply topically as needed  for dry skin. Use with Unna boots please at the back of the wounds on the lower extremities when the Unna boots are changed Patient not taking: Reported on 09/15/2022 07/21/22   Rhetta Mura, MD  pantoprazole (PROTONIX) 40 MG tablet Take 1 tablet (40 mg total) by mouth daily. 08/19/22   Jerald Kief, MD  Zinc Sulfate 220 (50 Zn) MG TABS Take 1 tablet (220 mg total) by mouth daily for 30 days 06/10/22   Leroy Sea, MD      Allergies    Pork-derived products    Review of Systems   Review of Systems   Respiratory:  Positive for shortness of breath.   Cardiovascular:  Positive for palpitations and leg swelling. Negative for chest pain.  Neurological:  Positive for dizziness. Negative for syncope, weakness, light-headedness and numbness.  Psychiatric/Behavioral:  Positive for depression.   All other systems reviewed and are negative.   Physical Exam Updated Vital Signs BP 117/82 (BP Location: Left Arm)   Pulse 77   Temp (!) 97.5 F (36.4 C) (Oral)   Resp 18   SpO2 98%  Physical Exam Vitals and nursing note reviewed.  Constitutional:      General: She is not in acute distress.    Appearance: Normal appearance. She is not ill-appearing, toxic-appearing or diaphoretic.  HENT:     Head: Normocephalic and atraumatic.     Nose: Nose normal.     Mouth/Throat:     Mouth: Mucous membranes are moist.     Pharynx: Oropharynx is clear.  Eyes:     Extraocular Movements: Extraocular movements intact.     Conjunctiva/sclera: Conjunctivae normal.     Pupils: Pupils are equal, round, and reactive to light.  Cardiovascular:     Rate and Rhythm: Normal rate and regular rhythm.  Pulmonary:     Effort: Pulmonary effort is normal.     Breath sounds: Normal breath sounds. No wheezing.  Abdominal:     General: Abdomen is flat. Bowel sounds are normal.     Palpations: Abdomen is soft.     Tenderness: There is no abdominal tenderness.  Musculoskeletal:     Cervical back: Normal range of motion and neck supple. No tenderness.     Right lower leg: Edema present.     Left lower leg: Edema present.  Skin:    General: Skin is warm and dry.     Capillary Refill: Capillary refill takes less than 2 seconds.  Neurological:     Mental Status: She is alert and oriented to person, place, and time.     ED Results / Procedures / Treatments   Labs (all labs ordered are listed, but only abnormal results are displayed) Labs Reviewed  BASIC METABOLIC PANEL - Abnormal; Notable for the following  components:      Result Value   Potassium 2.8 (*)    Chloride 113 (*)    CO2 20 (*)    Calcium 8.1 (*)    All other components within normal limits  CBC - Abnormal; Notable for the following components:   RBC 3.36 (*)    Hemoglobin 8.6 (*)    HCT 28.6 (*)    MCH 25.6 (*)    RDW 19.5 (*)    All other components within normal limits  BRAIN NATRIURETIC PEPTIDE - Abnormal; Notable for the following components:   B Natriuretic Peptide 538.7 (*)    All other components within normal limits  SALICYLATE LEVEL - Abnormal; Notable for  the following components:   Salicylate Lvl <7.0 (*)    All other components within normal limits  ACETAMINOPHEN LEVEL - Abnormal; Notable for the following components:   Acetaminophen (Tylenol), Serum <10 (*)    All other components within normal limits  ETHANOL  RAPID URINE DRUG SCREEN, HOSP PERFORMED  TROPONIN I (HIGH SENSITIVITY)  TROPONIN I (HIGH SENSITIVITY)    EKG EKG Interpretation Date/Time:  Thursday September 29 2022 14:29:00 EDT Ventricular Rate:  64 PR Interval:  200 QRS Duration:  106 QT Interval:  474 QTC Calculation: 489 R Axis:   49  Text Interpretation: Normal sinus rhythm Minimal voltage criteria for LVH, may be normal variant ( Cornell product ) Nonspecific ST abnormality Abnormal ECG When compared with ECG of 29-Sep-2022 14:26, PREVIOUS ECG IS PRESENT similar to previous Confirmed by Coralee Pesa [phone]) on 09/29/2022 2:59:20 PM  Radiology No results found.  Procedures Procedures   Medications Ordered in ED Medications  potassium chloride SA (KLOR-CON M) CR tablet 60 mEq (60 mEq Oral Given 09/29/22 1519)  potassium chloride 10 mEq in 100 mL IVPB (0 mEq Intravenous Stopped 09/29/22 1618)  furosemide (LASIX) tablet 20 mg (20 mg Oral Given 09/29/22 1707)    ED Course/ Medical Decision Making/ A&P Clinical Course as of 09/29/22 1820  Thu Sep 29, 2022  1737 Stable 27 YOF with extensive medical history Seen by TTS who cleared  for discharged. [CC]  1738 Here with multiple complaints combined psychiatric and medical.  Psychiatric complaints cleared by TTS earlier today.  Medical complaints include noncompliance on potassium supplement with mild hypokalemia resulting on lab work today.  Potassium supplemented in the emergency room.  Will restart patient on home medications potassium supplement.  She is on room air x-ray without diffuse pulmonary edema no acute distress stable for outpatient care management from a medical standpoint. [CC]    Clinical Course User Index [CC] Glyn Ade, MD    Medical Decision Making Amount and/or Complexity of Data Reviewed Labs: ordered. Radiology: ordered.   67 year old female presents to the ED for evaluation.  Please see HPI for further details.  On examination the patient is afebrile and nontachycardic.  Her lung sounds are clear bilaterally, she is not hypoxic.  Abdomen soft and compressible throughout.  Neurological examination at baseline.  Patient does have bilateral lower extremity edema 1+ bilaterally.  Legs unwrapped, patient does have what appears to cellulitis however she states this is chronic cellulitis and she was recently treated for this.  Patient CBC without leukocytosis, baseline hemoglobin 8.6.  Metabolic panel shows potassium 2.8, anion gap 7.  Patient states that she recently was advised to stop taking potassium as well as Lasix.  Patient given 60 mEq oral potassium, 10 mEq IV potassium.  Patient then was given Lasix for BNP of 538.7.  EKG is normal sinus rhythm, nonischemic.  Troponin 11, 10 with delta.  Chest x-ray shows no consolidations or effusions.  At this time, psychiatry has not put out note.  Myself and attending Dr. Doran Durand were planning on sending patient home with potassium supplementation as well as Lasix.  Reached out to psychiatry who actually states that the patient needs to be observed overnight.  Patient will be transported to the TCU.   She has been put on psych hold, diet has been ordered, PTA med rec also ordered.  Patient stable at this time for observation.   Final Clinical Impression(s) / ED Diagnoses Final diagnoses:  Encounter for psychiatric assessment  Dizziness  Rx / DC Orders ED Discharge Orders     None         Al Decant, PA-C 09/29/22 1820    Rozelle Logan, DO 09/30/22 1657

## 2022-09-29 NOTE — ED Notes (Signed)
Pt bag of medications placed in the cabinet over 5-8 nursing assignment

## 2022-09-29 NOTE — ED Notes (Signed)
Pt refusing cxr at this time.

## 2022-09-29 NOTE — ED Notes (Signed)
Pt placed on portable cardiac monitor for IV potassium

## 2022-09-29 NOTE — Progress Notes (Deleted)
Cardiology Clinic Note   Date: 09/29/2022 ID: Christine Jacobson, DOB 05-10-1955, MRN [id number]  Primary Cardiologist:  Thomasene Ripple, DO  Patient Profile    Christine Jacobson is a 67 y.o. female who presents to the clinic today for ***    Past medical history significant for: PAF. Onset March 2022. DCCV 02/01/2022. TEE/DCCV 09/20/2022. Chronic diastolic heart failure/HOCM. Echo 09/16/2022: EF 65 to 70%.  Basal septum measures 2 cm.  Findings suggest HOCM with 75 mmHg continuous mean gradient-Valsalva not performed.  No RWMA.  Severe asymmetric LVH of the basal-septal segment.  Diastolic function could not be evaluated.  Low normal RV function.  Severely elevated PA pressure.  RVSP 64.8 mmHg.  Massively dilated LA.  Mild RAE.  MS noted.  Moderate to severe MR.  Mild mitral stenosis.  Severe MAC.  Moderate TR.  Aortic valve sclerosis/calcification without stenosis.  Dilated IVC, RA pressure 15 mmHg.  Echo August 2023 showed RVSP 45 mmHg, HOCM with mean gradient 64 mmHg. MR/TR. TEE 09/20/2022: Mild MR, moderate MAC.  Moderate TR.  Trivial AI. Pulmonary hypertension. RHC 10/15/2021: RA 11 mmHg, RV 51/1 mmHg, PA 52/23 mmHg, mPAP 36 mmHg, PCW 17-20 mmHg, CO 4.3 L/min, CI 2.2 L/min/M2.  Moderate pulmonary hypertension. Hypertension. Hyperlipidemia. Palpitations. GI bleed. Medication nonadherence.     History of Present Illness    Christine Jacobson is a longtime patient of cardiology.  She has been followed by multiple cardiologists.  She began seeing Dr. Jens Som in October 2012 for chest pain and palpitations.  It appears she then changed to Dr. Royann Shivers in June 2014.  She established with Dr. Jacinto Halim at [facility] cardiovascular in July 2023.  She did make the decision to establish with Dr. Servando Salina in November 2023.  In summary, patient has a history of hypertrophic cardiomyopathy, chronic diastolic heart failure with Grade II DD, and PAF.  Previous echo have shown resting LVOT gradients as high as 64  mmHg and increasing to 144 mmHg with Valsalva maneuver on a background of asymmetric septal hypertrophy.  Doppler has shown evidence of pseudo normal LV filling pattern and mild PAH estimated at 38 mmHg.  Also notable, she had severely dilated left atrium with an end-diastolic diameter of 55 mm.  She had previous syncope and circumstances suggested possible vasovagal mechanism but quite possibly also related to LV outflow tract obstruction.  She was incidentally found to have A-fib during PCP visit on 04/16/2020.  Anticoagulated with Eliquis.  Underwent successful DCCV in January 2024.  Patient was last seen in the office by Dr. Servando Salina on 09/15/2022 with complaints of shortness of breath.  Patient admitted to not taking her Lasix since prior visit on 06/17/2022.  She was unable to speak in full sentences secondary to shortness of breath.  She was significantly volume overloaded as compared to her prior visit.  She was also noted to be back in rate controlled A-fib.  She was instructed to present to the ED for better management of volume overload.  She was admitted and underwent IV diuresis.  Echo was performed and is detailed above.  She reported adherence to Eliquis twice daily but given history of medication nonadherence it was felt safer to perform TEE prior to DCCV which she underwent on 09/20/2022.  She was discharged on 09/21/2022 to follow-up as an outpatient.  Referred to Dr. Izora Ribas for further HOCM evaluation.  Today, patient ***   Chronic diastolic heart failure/HOCM.  Echo August 2024 showed EF 65 to 70%, HOCM  with 75 mmHg continuous mean gradient (Valsalva not performed), severe asymmetric LVH of the basal-septal segment, diastolic function cannot be evaluated (further details in patient profile).  Patient*** Euvolemic and well compensated on exam.  Continue Toprol, Jardiance.  Patient has a referral in for Dr. Rutherford Limerick for further evaluation of HOCM. PAF.  S/p TEE/DCCV 09/20/2022.  Patient***  Denies spontaneous bleeding concerns.  Continue Toprol, Eliquis.   ROS: All other systems reviewed and are otherwise negative except as noted in History of Present Illness.  Studies Reviewed       ***  Risk Assessment/Calculations    {Does this patient have ATRIAL FIBRILLATION?:[phone]}          Physical Exam    VS:  There were no vitals taken for this visit. , BMI There is no height or weight on file to calculate BMI.  GEN: Well nourished, well developed, in no acute distress. Neck: No JVD or carotid bruits. Cardiac: *** RRR. No murmurs. No rubs or gallops.   Respiratory:  Respirations regular and unlabored. Clear to auscultation without rales, wheezing or rhonchi. GI: Soft, nontender, nondistended. Extremities: Radials/DP/PT 2+ and equal bilaterally. No clubbing or cyanosis. No edema ***  Skin: Warm and dry, no rash. Neuro: Strength intact.  Assessment & Plan   ***  Disposition: ***     {Are you ordering a CV Procedure (e.g. stress test, cath, DCCV, TEE, etc)?   Press F2        :[id number]}   Signed, Etta Grandchild. Delcie Ruppert, DNP, NP-C

## 2022-09-29 NOTE — ED Notes (Signed)
Patient transported to x-ray. ?

## 2022-09-30 ENCOUNTER — Ambulatory Visit: Payer: Medicare PPO | Admitting: Student

## 2022-09-30 DIAGNOSIS — F332 Major depressive disorder, recurrent severe without psychotic features: Secondary | ICD-10-CM | POA: Diagnosis present

## 2022-09-30 LAB — BASIC METABOLIC PANEL
Anion gap: 10 (ref 5–15)
BUN: 15 mg/dL (ref 8–23)
CO2: 21 mmol/L — ABNORMAL LOW (ref 22–32)
Calcium: 9 mg/dL (ref 8.9–10.3)
Chloride: 107 mmol/L (ref 98–111)
Creatinine, Ser: 1 mg/dL (ref 0.44–1.00)
GFR, Estimated: >60 mL/min (ref >60)
Glucose, Bld: 131 mg/dL — ABNORMAL HIGH (ref 70–99)
Potassium: 3.5 mmol/L (ref 3.5–5.1)
Sodium: 138 mmol/L (ref 135–145)

## 2022-09-30 LAB — MAGNESIUM: Magnesium: 1.8 mg/dL (ref 1.7–2.4)

## 2022-09-30 MED ORDER — ESCITALOPRAM OXALATE 10 MG PO TABS: 5.0000 mg | ORAL_TABLET | Freq: Every day | ORAL | Status: DC

## 2022-09-30 MED ORDER — ATORVASTATIN CALCIUM 40 MG PO TABS
80.0000 mg | ORAL_TABLET | Freq: Every day | ORAL | Status: DC
  Administered 2022-09-30: 80 mg via ORAL
  Filled 2022-09-30: qty 2

## 2022-09-30 MED ORDER — MIDODRINE HCL 5 MG PO TABS
5.0000 mg | ORAL_TABLET | Freq: Two times a day (BID) | ORAL | Status: DC
  Administered 2022-09-30: 5 mg via ORAL
  Filled 2022-09-30 (×2): qty 1

## 2022-09-30 MED ORDER — PANTOPRAZOLE SODIUM 40 MG PO TBEC
40.0000 mg | DELAYED_RELEASE_TABLET | Freq: Every day | ORAL | Status: DC
  Administered 2022-09-30: 40 mg via ORAL
  Filled 2022-09-30: qty 1

## 2022-09-30 MED ORDER — EMPAGLIFLOZIN 10 MG PO TABS
10.0000 mg | ORAL_TABLET | Freq: Every day | ORAL | Status: DC
  Administered 2022-09-30: 10 mg via ORAL
  Filled 2022-09-30: qty 1

## 2022-09-30 MED ORDER — APIXABAN 5 MG PO TABS
5.0000 mg | ORAL_TABLET | Freq: Two times a day (BID) | ORAL | Status: DC
  Administered 2022-09-30: 5 mg via ORAL
  Filled 2022-09-30 (×2): qty 1

## 2022-09-30 MED ORDER — METOPROLOL SUCCINATE ER 50 MG PO TB24
25.0000 mg | ORAL_TABLET | Freq: Every day | ORAL | Status: DC
  Administered 2022-09-30: 25 mg via ORAL
  Filled 2022-09-30: qty 1

## 2022-09-30 NOTE — ED Provider Notes (Addendum)
Emergency Medicine Observation Re-evaluation Note  Christine Jacobson is a 67 y.o. female, seen on rounds today.  Pt initially presented to the ED for complaints of Depression and Dizziness Currently, the patient is awake and alert.  She was sent to the ED for psych eval for worsening depression.  She said she has chronic depression and feels her baseline.  She wants to go home.  K was low yesterday.  It was supplemented and will be rechecked today.  TTS did see her and recommended overnight obs.  Physical Exam  BP 121/71 (BP Location: Left Arm)   Pulse 76   Temp 98.5 F (36.9 C) (Oral)   Resp 18   SpO2 96%  Physical Exam General: awake and alert Cardiac: rr Lungs: clear Psych: calm  ED Course / MDM  EKG:EKG Interpretation Date/Time:  Thursday September 29 2022 14:29:00 EDT Ventricular Rate:  64 PR Interval:  200 QRS Duration:  106 QT Interval:  474 QTC Calculation: 489 R Axis:   49  Text Interpretation: Normal sinus rhythm Minimal voltage criteria for LVH, may be normal variant ( Cornell product ) Nonspecific ST abnormality Abnormal ECG When compared with ECG of 29-Sep-2022 14:26, PREVIOUS ECG IS PRESENT similar to previous Confirmed by Coralee Pesa [phone]) on 09/29/2022 2:59:20 PM  I have reviewed the labs performed to date as well as medications administered while in observation.  Recent changes in the last 24 hours include ED and TTS eval.  Pt has been medically clear by psych.  Her friend will pick her up.   Plan  Current plan is for re-assessment this am.  Recheck K.    Repeat K is 3.5, mg 1.8  Jacalyn Lefevre, MD 09/30/22 6440    Jacalyn Lefevre, MD 09/30/22 1106    Jacalyn Lefevre, MD 09/30/22 1320

## 2022-09-30 NOTE — ED Notes (Signed)
Patient calm and cooperative tonight.

## 2022-09-30 NOTE — Discharge Summary (Cosign Needed Addendum)
[facility] Psych ED Discharge  09/30/2022 2:58 PM Christine Jacobson  MRN:  [id number]  Principal Problem: Major depressive disorder, recurrent severe without psychotic features [facility]) Discharge Diagnoses: Principal Problem:   Major depressive disorder, recurrent severe without psychotic features (HCC) Active Problems:   Anxiety   MDD (major depressive disorder)  Clinical Impression:  Final diagnoses:  Encounter for psychiatric assessment  Dizziness  Hypokalemia   Subjective: Christine Jacobson is a 67 y.o. female patient with medical history of acquired thrombophilia, shortness of breath, hokum, chest pain, hypertension, liver function test abnormality, palpitations, atrial fibrillation on Eliquis, cellulitis of bilateral lower extremities. The patient presents to the ED for evaluation.  Patient was seen this morning awake, alert and oriented x4.  She engaged in meaningful conversation.  Patient states she became angry and anxious when she heard her young neighbor committed suicide.  Patient also states her brother died recently but she is over all handling the situation well.   She also reports that she is struggling with finance as well.  However patient states staying in the hospital will not benefit her.  She is interested in counseling and would like a referral and information for counseling.  Patient has a diagnosis of Major depression but  no previous hospitalization.  Patient denies  SI/HI/AVH. Collateral information from a friend, Donnetta Hutching is that patient has been experiencing some stress including the death of her brother and neighbor that committed suicide.  She reports that patient is not a danger to herself but is concerned about her ability to take care os herself. Patient is cleared Psychiatrically to go back home with resources for [facility] and , meals on wheels and case management services, adult daycare and in home assistance.  Patient plans to seek counseling as well.  Patient is  Psychiatrically cleared.  Dr Lucianne Muss, Psychiatrist is in agreement with discharge plan. ED Assessment Time Calculation: Start Time: 1432 Stop Time: 1455 Total Time in Minutes (Assessment Completion): 23   Past Psychiatric History: Major Depression without Psychosis  Past Medical History:  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   2D ECHO - EF >55%, normal   HTN (hypertension)    Hyperprolactinemia (HCC)    dx in her 18, took meds, self d/c a while back   Liver function test abnormality    normal when repeated   Mild hyperlipidemia    Obesity    Palpitations    negative stress echo in November 2012 with normal LV function; mild LVH, proximal septal thickening with narrow LVOT and mild gradient; mild MR and TR; Cardionet showed PACs in November 2012   Pyoderma gangrenosa    Shortness of breath 07/11/2011   MET TEST    Past Surgical History:  Procedure Laterality Date   BIOPSY  08/10/2022   Procedure: BIOPSY;  Surgeon: Willis Modena, MD;  Location: [facility];  Service: Gastroenterology;;   CARDIOVERSION N/A 02/01/2022   Procedure: CARDIOVERSION;  Surgeon: Thomasene Ripple, DO;  Location: [facility] ENDOSCOPY;  Service: Cardiovascular;  Laterality: N/A;   CARDIOVERSION N/A 09/20/2022   Procedure: CARDIOVERSION;  Surgeon: Dolores Patty, MD;  Location: [facility] INVASIVE CV LAB;  Service: Cardiovascular;  Laterality: N/A;   COLONOSCOPY WITH PROPOFOL Left 08/10/2022   Procedure: COLONOSCOPY WITH PROPOFOL;  Surgeon: Willis Modena, MD;  Location: [facility] ENDOSCOPY;  Service: Gastroenterology;  Laterality: Left;   ESOPHAGOGASTRODUODENOSCOPY (EGD) WITH PROPOFOL Left 08/10/2022   Procedure: ESOPHAGOGASTRODUODENOSCOPY (EGD) WITH PROPOFOL;  Surgeon: Willis Modena, MD;  Location: [facility]  ENDOSCOPY;  Service: Gastroenterology;  Laterality: Left;   POLYPECTOMY  08/10/2022   Procedure: POLYPECTOMY;  Surgeon: Willis Modena, MD;  Location: [facility] ENDOSCOPY;  Service: Gastroenterology;;   RIGHT HEART CATH N/A 10/15/2021   Procedure: RIGHT HEART CATH;  Surgeon: Elder Negus, MD;  Location: [facility] INVASIVE CV LAB;  Service: Cardiovascular;  Laterality: N/A;   SKIN GRAFT  2006   porcine, R leg   TEE WITHOUT CARDIOVERSION N/A 09/20/2022   Procedure: TRANSESOPHAGEAL ECHOCARDIOGRAM;  Surgeon: Dolores Patty, MD;  Location: [facility] INVASIVE CV LAB;  Service: Cardiovascular;  Laterality: N/A;   Family History:  Family History  Problem Relation Age of Onset   Heart disease Mother        endocarditis   Ovarian cancer Mother    Emphysema Father    Coronary artery disease Other        F in his 7   Diabetes Other        GP   Cancer Neg Hx    Family Psychiatric  History: unknown by patient Social History:  Social History   Substance and Sexual Activity  Alcohol Use Not Currently   Alcohol/week: 1.0 standard drink of alcohol   Types: 1 Glasses of wine per week   Comment: "I do have my wine" every day, has cut down from 2 bottles at a time, currently 2-3 glasses     Social History   Substance and Sexual Activity  Drug Use No    Social History   Socioeconomic History   Marital status: Single    Spouse name: Not on file   Number of children: 0   Years of education: Not on file   Highest education level: Not on file  Occupational History   Occupation: para Psychologist, forensic, part time cook  Tobacco Use   Smoking status: Former    Current packs/day: 1.00    Average packs/day: 1 pack/day for 4.0 years (4.0 ttl pk-yrs)    Types: Cigarettes   Smokeless tobacco: Never  Vaping Use   Vaping status: Never Used  Substance and Sexual Activity   Alcohol use: Not Currently    Alcohol/week: 1.0 standard drink of alcohol    Types: 1 Glasses of wine per week    Comment: "I do have  my wine" every day, has cut down from 2 bottles at a time, currently 2-3 glasses   Drug use: No   Sexual activity: Not Currently  Other Topics Concern   Not on file  Social History Narrative   Lives by self   Social Determinants of Health   Financial Resource Strain: Low Risk  (12/03/2021)   Overall Financial Resource Strain (CARDIA)    Difficulty of Paying Living Expenses: Not very hard  Food Insecurity: No Food Insecurity (09/15/2022)   Hunger Vital Sign    Worried About Running Out of Food in the Last Year: Never true    Ran Out of Food in the Last Year: Never true  Transportation Needs: No Transportation Needs (09/15/2022)   PRAPARE - Administrator, Civil Service (Medical): No    Lack of Transportation (Non-Medical): No  Physical Activity: Not on file  Stress: Not on file  Social Connections: Not on file    Tobacco Cessation:  N/A, patient does not currently use  tobacco products  Current Medications: Current Facility-Administered Medications  Medication Dose Route Frequency Provider Last Rate Last Admin   apixaban (ELIQUIS) tablet 5 mg  5 mg Oral BID Jacalyn Lefevre, MD   5 mg at 09/30/22 1154   atorvastatin (LIPITOR) tablet 80 mg  80 mg Oral Daily Jacalyn Lefevre, MD   80 mg at 09/30/22 1154   empagliflozin (JARDIANCE) tablet 10 mg  10 mg Oral Daily Jacalyn Lefevre, MD   10 mg at 09/30/22 1154   escitalopram (LEXAPRO) tablet 10 mg  10 mg Oral Daily Motley-Mangrum, Jadeka A, PMHNP   10 mg at 09/30/22 0918   hydrOXYzine (ATARAX) tablet 10 mg  10 mg Oral BID Motley-Mangrum, Jadeka A, PMHNP   10 mg at 09/30/22 0918   metoprolol succinate (TOPROL-XL) 24 hr tablet 25 mg  25 mg Oral Daily Jacalyn Lefevre, MD   25 mg at 09/30/22 1154   midodrine (PROAMATINE) tablet 5 mg  5 mg Oral BID Jacalyn Lefevre, MD   5 mg at 09/30/22 1154   pantoprazole (PROTONIX) EC tablet 40 mg  40 mg Oral Daily Jacalyn Lefevre, MD   40 mg at 09/30/22 1154   Current Outpatient Medications   Medication Sig Dispense Refill   apixaban (ELIQUIS) 5 MG TABS tablet Take 1 tablet (5 mg total) by mouth 2 (two) times daily. 60 tablet 5   ascorbic acid (VITAMIN C) 500 MG tablet Take 1 tablet (500 mg total) by mouth daily for 30 days 100 tablet 0   atorvastatin (LIPITOR) 80 MG tablet Take 1 tablet (80 mg total) by mouth daily. 30 tablet 0   empagliflozin (JARDIANCE) 10 MG TABS tablet Take 1 tablet (10 mg total) by mouth daily. 30 tablet 0   escitalopram (LEXAPRO) 5 MG tablet Take 1 tablet (5 mg total) by mouth daily. 30 tablet 0   hydrOXYzine (ATARAX) 10 MG tablet Take 1 tablet (10 mg total) by mouth at bedtime. 90 tablet 0   metoprolol succinate (TOPROL-XL) 25 MG 24 hr tablet Take 1 tablet (25 mg total) by mouth daily. 30 tablet 0   midodrine (PROAMATINE) 5 MG tablet Take 1 tablet (5 mg total) by mouth 2 (two) times daily. 180 tablet 3   pantoprazole (PROTONIX) 40 MG tablet Take 1 tablet (40 mg total) by mouth daily. 90 tablet 3   Zinc Sulfate 220 (50 Zn) MG TABS Take 1 tablet (220 mg total) by mouth daily for 30 days 100 tablet 0   mineral oil-hydrophilic petrolatum (AQUAPHOR) ointment Apply topically as needed for dry skin. Use with Unna boots please at the back of the wounds on the lower extremities when the Unna boots are changed (Patient not taking: Reported on 09/15/2022) 420 g 0   PTA Medications: (Not in a hospital admission)   [place] Scale:  Flowsheet Row ED from 09/29/2022 in [facility] Department at [facility] ED to Hosp-Admission (Discharged) from 09/15/2022 in [place] [facility] ED to Hosp-Admission (Discharged) from 08/04/2022 in [place] [facility]  C-SSRS RISK CATEGORY No Risk No Risk No Risk       Musculoskeletal: Strength & Muscle Tone:  sitting in bed with walker near her Gait & Station:  see above Patient leans:  utilizes walker  Psychiatric Specialty Exam: Presentation  General Appearance:  Casual; Neat  Eye  Contact: Good  Speech: Clear and Coherent; Normal Rate  Speech Volume: Normal  Handedness: Right   Mood and Affect  Mood: Depressed; Anxious  Affect:  Congruent; Depressed   Thought Process  Thought Processes: Coherent; Goal Directed  Descriptions of Associations:Intact  Orientation:Full (Time, Place and Person)  Thought Content:Logical  History of Schizophrenia/Schizoaffective disorder:No data recorded Duration of Psychotic Symptoms:No data recorded Hallucinations:Hallucinations: None  Ideas of Reference:None  Suicidal Thoughts:Suicidal Thoughts: No  Homicidal Thoughts:Homicidal Thoughts: No   Sensorium  Memory: Immediate Good; Recent Good; Remote Good  Judgment: Good  Insight: Good   Executive Functions  Concentration: Good  Attention Span: Good  Recall: Good  Fund of Knowledge: Good  Language: Good   Psychomotor Activity  Psychomotor Activity: Psychomotor Activity: Normal   Assets  Assets: Communication Skills; Desire for Improvement; Housing; Social Support   Sleep  Sleep: Sleep: Good    Physical Exam: Physical Exam Constitutional:      Appearance: Normal appearance.  HENT:     Nose: Nose normal.  Cardiovascular:     Rate and Rhythm: Normal rate.  Pulmonary:     Effort: Pulmonary effort is normal.  Musculoskeletal:     Comments: Utilizes walker  Skin:    General: Skin is dry.  Neurological:     Mental Status: She is alert and oriented to person, place, and time.  Psychiatric:        Attention and Perception: Attention and perception normal.        Mood and Affect: Mood is depressed.        Speech: Speech normal.        Behavior: Behavior normal. Behavior is cooperative.        Thought Content: Thought content normal.        Cognition and Memory: Cognition and memory normal.        Judgment: Judgment normal.    Review of Systems  Constitutional: Negative.   HENT: Negative.    Eyes: Negative.    Respiratory: Negative.    Cardiovascular: Negative.   Gastrointestinal: Negative.   Genitourinary: Negative.   Musculoskeletal:        Utilizes walker  Skin: Negative.   Neurological: Negative.   Endo/Heme/Allergies: Negative.   Psychiatric/Behavioral:  Positive for depression. The patient is nervous/anxious.    Blood pressure 120/81, pulse 70, temperature 97.9 F (36.6 C), temperature source Oral, resp. rate 18, SpO2 100%. There is no height or weight on file to calculate BMI.   Demographic Factors:  Age 31 or older, Caucasian, Low socioeconomic status, Living alone, and Unemployed  Loss Factors: Financial problems/change in socioeconomic status  Historical Factors: NA  Risk Reduction Factors:   Sense of responsibility to family and Positive social support  Continued Clinical Symptoms:  Depression:   Insomnia  Cognitive Features That Contribute To Risk:  None    Suicide Risk:  Minimal: No identifiable suicidal ideation.  Patients presenting with no risk factors but with morbid ruminations; may be classified as minimal risk based on the severity of the depressive symptoms   Follow-up Information     Schedule an appointment as soon as possible for a visit  with Karenann Cai, NP.   Specialty: Nurse Practitioner Why: As needed Contact information: [address] [place] [address] [phone]         Go to  [facility].   Specialty: Urgent Care Why: If symptoms worsen Contact information: [address] [place] [address] [phone]                Plan Of Care/Follow-up recommendations:  Activity:  as tolerated Diet:  regular  Medical Decision  Making: Patient is alert and oriented x 4, denies SI/HI/AVH.  Patient plans to seek counseling to deal with brother's and neighbor's death.  Resources have been offered to patient.  Patient is Psychiatrically cleared.  Disposition: Discharge  Earney Navy, NP-PMHNP-BC 09/30/2022, 2:58 PM

## 2022-09-30 NOTE — ED Notes (Signed)
Patient discharged off unit to home per provider. Patient alert, cooperative, no s/s of distress at this time. Patient discharge information given to patient, with acknowledged understanding. Belongings given to patient . Patient ambulatory with walker. Patient off unit using w/c, escorted by RN. Patient transported by friend.

## 2022-09-30 NOTE — ED Notes (Signed)
[facility] called pts. friend, Donnetta Hutching [phone]) for collateral. Pts. friend said that pt. has been experiencing stress with the recent death of her brother, suicide of a friend and financial issues. Pts. friend reports that she not always forthcoming about what she is struggling with due to privacy and pride issues. Pts. friend believes she is not currently a danger to herself but is concerned about pts. ability to manage her life financially and her ADLs.   [facility] informed pts. friend that she will be discharged to day and has agreed to pick the pt. up after 3 pm. [facility] has provided resources to the pts. for Meals on Wheels, case management services, in home assistance, adult daycare, and transportation assistance.   Jacquelynn Cree, [facility]  09/30/22

## 2022-09-30 NOTE — ED Notes (Signed)
Patient is alert and oriented. Patient calm and cooperative. Patient guarded. Irritable at times.  No suicidal ideation noted.  No homicidal ideation noted. No paranoia or hallucinations noted. Patient endorsed depression.

## 2022-11-17 ENCOUNTER — Telehealth: Payer: Self-pay | Admitting: Cardiology

## 2022-11-17 NOTE — Telephone Encounter (Signed)
Called pt to discuss her questions. Spoke with Christine Jacobson (person who made the appointments). She explained "Dr. Dagmar Hait and Dr. Jomarie Longs have a HOCM clinic. The first hour is to discuss HOCM and the second hour is to do the genetic testing." Relied that message to pt. She verbalized understanding. Pt aware to ask her questions at the appt.

## 2022-11-17 NOTE — Telephone Encounter (Signed)
Patient wants a call back from Dr. Servando Salina or her nurse regarding the reason she is scheduled to see Dr. Jomarie Longs on Monday, 11/4.

## 2022-11-21 ENCOUNTER — Ambulatory Visit: Payer: Medicare PPO | Admitting: Internal Medicine

## 2022-11-21 ENCOUNTER — Ambulatory Visit: Payer: Medicare PPO | Admitting: Genetic Counselor

## 2022-11-21 NOTE — Progress Notes (Deleted)
Cardiology Office Note:    Date:  11/21/2022   ID:  Christine Jacobson, DOB 09/27/1955, MRN [id number]  PCP:  Karenann Cai, NP   [facility] Providers Cardiologist:  Thomasene Ripple, DO { Click to update primary MD,subspecialty MD or APP then REFRESH:1}    Referring MD: Thomasene Ripple, DO   CC: *** Consulted for the evaluation of cardiac myosin inhibitor start at the behest of Dr. Servando Salina   History of Present Illness:    Christine Jacobson is a 67 y.o. female with a hx of obstructive hypertrophic cardiomyopathy, Paroxysmal atrial fibrillation, s/p transfusion in 2024 (no AC start for this reason), with HLD and HTN.  Patient notes *** SOB at rest and *** DOE Is able to *** on *** therapy. Notes *** fatigue. Notes *** palpitations Notes *** CP. Notes *** Dizziness. Notes *** syncope.  Has previously tried ***  Notable family events include ***.  Relevant testing includes ***.   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   2D ECHO - EF >55%, normal   HTN (hypertension)    Hyperprolactinemia (HCC)    dx in her 18, took meds, self d/c a while back   Liver function test abnormality    normal when repeated   Mild hyperlipidemia    Obesity    Palpitations    negative stress echo in November 2012 with normal LV function; mild LVH, proximal septal thickening with narrow LVOT and mild gradient; mild MR and TR; Cardionet showed PACs in November 2012   Pyoderma gangrenosa    Shortness of breath 07/11/2011   MET TEST    Past Surgical History:  Procedure Laterality Date   BIOPSY  08/10/2022   Procedure: BIOPSY;  Surgeon: Willis Modena, MD;  Location: [facility];  Service: Gastroenterology;;   CARDIOVERSION N/A 02/01/2022   Procedure:  CARDIOVERSION;  Surgeon: Thomasene Ripple, DO;  Location: [facility];  Service: Cardiovascular;  Laterality: N/A;   CARDIOVERSION N/A 09/20/2022   Procedure: CARDIOVERSION;  Surgeon: Dolores Patty, MD;  Location: [facility];  Service: Cardiovascular;  Laterality: N/A;   COLONOSCOPY WITH PROPOFOL Left 08/10/2022   Procedure: COLONOSCOPY WITH PROPOFOL;  Surgeon: Willis Modena, MD;  Location: [facility];  Service: Gastroenterology;  Laterality: Left;   ESOPHAGOGASTRODUODENOSCOPY (EGD) WITH PROPOFOL Left 08/10/2022   Procedure: ESOPHAGOGASTRODUODENOSCOPY (EGD) WITH PROPOFOL;  Surgeon: Willis Modena, MD;  Location: [facility];  Service: Gastroenterology;  Laterality: Left;   POLYPECTOMY  08/10/2022   Procedure: POLYPECTOMY;  Surgeon: Willis Modena, MD;  Location: [facility];  Service: Gastroenterology;;   RIGHT HEART CATH N/A 10/15/2021   Procedure: RIGHT HEART CATH;  Surgeon: Elder Negus, MD;  Location: [facility];  Service: Cardiovascular;  Laterality: N/A;   SKIN GRAFT  2006   porcine, R leg   TEE WITHOUT CARDIOVERSION N/A 09/20/2022   Procedure: TRANSESOPHAGEAL ECHOCARDIOGRAM;  Surgeon: Dolores Patty, MD;  Location: [facility];  Service: Cardiovascular;  Laterality: N/A;    Current Medications: No outpatient medications have been marked as taking for the 11/21/22 encounter (Appointment) with Christell Constant, MD.     Allergies:   Pork-derived products   Social History   Socioeconomic History   Marital status: Single    Spouse name: Not on  file   Number of children: 0   Years of education: Not on file   Highest education level: Not on file  Occupational History   Occupation: para Psychologist, forensic, part time cook  Tobacco Use   Smoking status: Former    Current packs/day: 1.00    Average packs/day: 1 pack/day for 4.0 years (4.0 ttl pk-yrs)    Types: Cigarettes   Smokeless tobacco: Never  Vaping Use   Vaping status: Never Used   Substance and Sexual Activity   Alcohol use: Not Currently    Alcohol/week: 1.0 standard drink of alcohol    Types: 1 Glasses of wine per week    Comment: "I do have my wine" every day, has cut down from 2 bottles at a time, currently 2-3 glasses   Drug use: No   Sexual activity: Not Currently  Other Topics Concern   Not on file  Social History Narrative   Lives by self   Social Determinants of Health   Financial Resource Strain: Low Risk  (12/03/2021)   Overall Financial Resource Strain (CARDIA)    Difficulty of Paying Living Expenses: Not very hard  Food Insecurity: No Food Insecurity (09/15/2022)   Hunger Vital Sign    Worried About Running Out of Food in the Last Year: Never true    Ran Out of Food in the Last Year: Never true  Transportation Needs: No Transportation Needs (09/15/2022)   PRAPARE - Administrator, Civil Service (Medical): No    Lack of Transportation (Non-Medical): No  Physical Activity: Not on file  Stress: Not on file  Social Connections: Not on file     Family History: The patient's ***family history includes Coronary artery disease in an other family member; Diabetes in an other family member; Emphysema in her father; Heart disease in her mother; Ovarian cancer in her mother. There is no history of Cancer.  ROS:   Please see the history of present illness.     EKGs/Labs/Other Studies Reviewed:    The following studies were reviewed today:  Cardiac Studies & Procedures       ECHOCARDIOGRAM  ECHOCARDIOGRAM COMPLETE 09/16/2022  Narrative ECHOCARDIOGRAM REPORT    Patient Name:   Christine Jacobson Date of Exam: 09/16/2022 Medical Rec #:  [id number]     Height:       64.0 in Accession #:    [id number]    Weight:       196.6 lb Date of Birth:  May 17, 1955     BSA:          1.942 m Patient Age:    67 years      BP:           97/58 mmHg Patient Gender: F             HR:           90 bpm. Exam Location:  Inpatient  Procedure: 2D Echo,  Cardiac Doppler and Color Doppler  Indications:    HOCM  History:        Patient has prior history of Echocardiogram examinations. CHF, Arrythmias:Atrial Fibrillation; Risk Factors:Diabetes, Dyslipidemia and Hypertension. Prior Echo with [facility].  Sonographer:    Meagan Baucom RDCS, FE, PE Referring Phys: [id number] PAULA V ROSS  IMPRESSIONS   1. The basal septum measures 2.0 cm. Findings suggest HOCM with 75 mmHg continuous mean gradient - valsalva not performed. Left ventricular ejection fraction, by estimation, is 65 to 70%. Left ventricular ejection fraction  by PLAX is 65 %. The left ventricle has normal function. The left ventricle has no regional wall motion abnormalities. There is severe asymmetric left ventricular hypertrophy of the basal-septal segment. Left ventricular diastolic function could not be evaluated. 2. Right ventricular systolic function is low normal. The right ventricular size is normal. There is severely elevated pulmonary artery systolic pressure. The estimated right ventricular systolic pressure is 64.8 mmHg. 3. Left atrial size was massively dilated. 4. Right atrial size was mildly dilated. 5. SAM is noted. The mitral valve is degenerative. Moderate to severe mitral valve regurgitation. Mild mitral stenosis. Severe mitral annular calcification. 6. Tricuspid valve regurgitation is moderate. 7. The aortic valve is tricuspid. Aortic valve regurgitation is not visualized. Aortic valve sclerosis/calcification is present, without any evidence of aortic stenosis. 8. The inferior vena cava is dilated in size with <50% respiratory variability, suggesting right atrial pressure of 15 mmHg.  Comparison(s): Changes from prior study are noted. 09/02/2021: LVEF 60-65%, RVSP 45 mmHg, HOCM with mean gradient 64 mmHg.  FINDINGS Left Ventricle: The basal septum measures 2.0 cm. Findings suggest HOCM with 75 mmHg continuous mean gradient - valsalva not performed. Left  ventricular ejection fraction, by estimation, is 65 to 70%. Left ventricular ejection fraction by PLAX is 65 %. The left ventricle has normal function. The left ventricle has no regional wall motion abnormalities. The left ventricular internal cavity size was normal in size. There is severe asymmetric left ventricular hypertrophy of the basal-septal segment. Left ventricular diastolic function could not be evaluated due to mitral annular calcification (moderate or greater). Left ventricular diastolic function could not be evaluated.  Right Ventricle: The right ventricular size is normal. No increase in right ventricular wall thickness. Right ventricular systolic function is low normal. There is severely elevated pulmonary artery systolic pressure. The tricuspid regurgitant velocity is 3.53 m/s, and with an assumed right atrial pressure of 15 mmHg, the estimated right ventricular systolic pressure is 64.8 mmHg.  Left Atrium: Left atrial size was massively dilated.  Right Atrium: Right atrial size was mildly dilated.  Pericardium: There is no evidence of pericardial effusion.  Mitral Valve: SAM is noted. The mitral valve is degenerative in appearance. Severe mitral annular calcification. Moderate to severe mitral valve regurgitation, with posteriorly-directed jet. Mild mitral valve stenosis.  Tricuspid Valve: The tricuspid valve is grossly normal. Tricuspid valve regurgitation is moderate.  Aortic Valve: The aortic valve is tricuspid. Aortic valve regurgitation is not visualized. Aortic valve sclerosis/calcification is present, without any evidence of aortic stenosis.  Pulmonic Valve: The pulmonic valve was grossly normal. Pulmonic valve regurgitation is trivial.  Aorta: The aortic root and ascending aorta are structurally normal, with no evidence of dilitation.  Venous: The inferior vena cava is dilated in size with less than 50% respiratory variability, suggesting right atrial pressure of 15  mmHg.  IAS/Shunts: No atrial level shunt detected by color flow Doppler.   LEFT VENTRICLE PLAX 2D LV EF:         Left            Diastology ventricular     LV e' medial:    5.11 cm/s ejection        LV E/e' medial:  35.0 fraction by     LV e' lateral:   7.62 cm/s PLAX is 65      LV E/e' lateral: 23.5 %. LVIDd:         3.70 cm LVIDs:         2.40 cm LV  PW:         1.30 cm LV IVS:        2.00 cm LVOT diam:     1.90 cm LV SV:         349 LV SV Index:   180 LVOT Area:     2.84 cm   RIGHT VENTRICLE RV S prime:     10.00 cm/s TAPSE (M-mode): 1.5 cm  LEFT ATRIUM              Index         RIGHT ATRIUM           Index LA diam:        5.70 cm  2.93 cm/m    RA Area:     17.30 cm LA Vol (A2C):   149.0 ml 76.71 ml/m   RA Volume:   37.30 ml  19.20 ml/m LA Vol (A4C):   251.0 ml 129.22 ml/m LA Biplane Vol: 200.0 ml 102.97 ml/m AORTIC VALVE LVOT Vmean:  376.000 cm/s LVOT VTI:    1.230 m  AORTA Ao Root diam: 2.90 cm Ao Asc diam:  3.60 cm  MITRAL VALVE                TRICUSPID VALVE MV Area (PHT): 2.72 cm     TR Peak grad:   49.8 mmHg MV Decel Time: 279 msec     TR Vmax:        353.00 cm/s MV E velocity: 179.00 cm/s SHUNTS Systemic VTI:  1.23 m Systemic Diam: 1.90 cm  Zoila Shutter MD Electronically signed by Zoila Shutter MD Signature Date/Time: 09/16/2022/4:27:56 PM    Final   TEE  ECHO TEE 09/20/2022  Narrative TRANSESOPHOGEAL ECHO REPORT    Patient Name:   Christine Jacobson Date of Exam: 09/20/2022 Medical Rec #:  [id number]     Height:       64.0 in Accession #:    [id number]    Weight:       195.3 lb Date of Birth:  08/17/1955     BSA:          1.937 m Patient Age:    67 years      BP:           134/107 mmHg Patient Gender: F             HR:           94 bpm. Exam Location:  Inpatient  Procedure: Transesophageal Echo, Cardiac Doppler and Color Doppler  Indications:     Arrhythmia  History:         Patient has prior history of Echocardiogram examinations,  most recent 09/16/2022. Hypertrophic Cardiomyopathy, Abnormal ECG; Mitral Valve Disease.  Sonographer:     Darlys Gales Referring Phys:  [id number] Minda Meo HALEY Diagnosing Phys: Arvilla Meres MD  PROCEDURE: After discussion of the risks and benefits of a TEE, an informed consent was obtained from the patient. The transesophogeal probe was passed without difficulty through the esophogus of the patient. Sedation performed by different physician. The patient was monitored while under deep sedation. Anesthestetic sedation was provided intravenously by Anesthesiology: 260mg  of Propofol. The patient developed no complications during the procedure. A successful direct current cardioversion was performed at 200 joules with 1 attempt.  IMPRESSIONS   1. Left ventricular ejection fraction, by estimation, is 70 to 75%. The left ventricle has hyperdynamic function. There is severe asymmetric left ventricular hypertrophy of the septal segment. 2.  Right ventricular systolic function is normal. The right ventricular size is normal. 3. Left atrial size was Massivel dialted. No left atrial/left atrial appendage thrombus was detected. 4. Right atrial size was moderately dilated. 5. The mitral valve is normal in structure. Mild mitral valve regurgitation. Moderate mitral annular calcification. 6. Tricuspid valve regurgitation is moderate. 7. The aortic valve is tricuspid. There is mild calcification of the aortic valve. Aortic valve regurgitation is trivial.  FINDINGS Left Ventricle: Left ventricular ejection fraction, by estimation, is 70 to 75%. The left ventricle has hyperdynamic function. The left ventricular internal cavity size was normal in size. There is severe asymmetric left ventricular hypertrophy of the septal segment.  Right Ventricle: The right ventricular size is normal. No increase in right ventricular wall thickness. Right ventricular systolic function is normal.  Left Atrium: Left atrial  size was Massivel dialted. No left atrial/left atrial appendage thrombus was detected.  Right Atrium: Right atrial size was moderately dilated.  Pericardium: There is no evidence of pericardial effusion.  Mitral Valve: The mitral valve is normal in structure. Moderate mitral annular calcification. Mild mitral valve regurgitation.  Tricuspid Valve: The tricuspid valve is normal in structure. Tricuspid valve regurgitation is moderate.  Aortic Valve: The aortic valve is tricuspid. There is mild calcification of the aortic valve. Aortic valve regurgitation is trivial.  Pulmonic Valve: The pulmonic valve was normal in structure. Pulmonic valve regurgitation is trivial.  Aorta: The aortic root is normal in size and structure.  IAS/Shunts: No atrial level shunt detected by color flow Doppler.  Arvilla Meres MD Electronically signed by Arvilla Meres MD Signature Date/Time: 09/21/2022/1:29:38 PM    Final             Recent Labs: 08/14/2022: ALT 21 09/16/2022: TSH 0.560 09/29/2022: B Natriuretic Peptide 538.7; Hemoglobin 8.6; Platelets 259 09/30/2022: BUN 15; Creatinine, Ser 1.00; Magnesium 1.8; Potassium 3.5; Sodium 138  Recent Lipid Panel    Component Value Date/Time   CHOL 228 (H) 06/03/2022 0028   TRIG 172 (H) 06/03/2022 0028   HDL 48 06/03/2022 0028   CHOLHDL 4.8 06/03/2022 0028   VLDL 34 06/03/2022 0028   LDLCALC 146 (H) 06/03/2022 0028   LDLDIRECT 121.8 05/26/2011 0928     Risk Assessment/Calculations:   {Does this patient have ATRIAL FIBRILLATION?[phone]}  No BP recorded.  {Refresh Note OR Click here to enter BP  :1}***         Physical Exam:    VS:  There were no vitals taken for this visit.    Wt Readings from Last 3 Encounters:  09/21/22 183 lb (83 kg)  09/15/22 185 lb 9.6 oz (84.2 kg)  08/14/22 196 lb 3.4 oz (89 kg)     GEN: *** Well nourished, well developed in no acute distress HEENT: Normal NECK: No JVD; No carotid bruits LYMPHATICS: No  lymphadenopathy CARDIAC: ***RRR, no murmurs, rubs, gallops RESPIRATORY:  Clear to auscultation without rales, wheezing or rhonchi  ABDOMEN: Soft, non-tender, non-distended MUSCULOSKELETAL:  No edema; No deformity  SKIN: Warm and dry NEUROLOGIC:  Alert and oriented x 3 PSYCHIATRIC:  Normal affect   ASSESSMENT:    No diagnosis found. PLAN:    Hypertrophic Cardiomyopathy*** vs *** - *** Variant - peak gradient *** on ***  - ***with/without MR, Apical Aneurysm, other considerations*** - suspicion of Fabry's/Danon/Noonan's or other mimics of HCM: *** - Gene variant: *** - Indexed assessment: [email or web address].com/ - NYHA ***  - Non HCM Contributors to disease/status *** ***HTN- - (While note showing secondary  prevenitve remodeling INHERIT trial of losartan showed safety in patients with HCM).   Family history ***, Discussed family screening  *** Family 13-22, Family 22+, SCD in family, HCM in family - Family history and HTN: Will preferential start on valsartan ***  SCD  Assessment - Exercise testing normal for *** rhythm and *** peak gradient and *** no change in blood pressure or syncope on exercise testing) - Echo from *** notable for *** - CMR from *** notable for *** - *** 2 year assessment for VT on rhythm monitor *** - SCD risk estimated to be *** at 5 years SDM: we have discussed ***  Atrial fibrillation Assessment  - Atrial arrhythmia management: *** - Eliquis restart Midodrine ***  Medication symptom plan - *** CCB, BB - Descalation of *** - Congestions/diuretics: *** - disopyramide *** - mavacamten consideration*** - clinical trial consideration: ***  ASA - age ***, MR ***, Redundant septal perforator anatomy - Alcohol septal ablation results in reduction of one or more NYHA class in approximately 90 percent of patients who undergo the procedure. The risks of alcohol septal ablation include a mortality risk of approximately 1 percent, a 10 percent risk  of permanent pacemaker placement, and a 10 percent chance for the need for a repeat procedure due to suboptimal gradient reduction after the initial procedure. In patients who are not candidates for surgical myectomy or in patients who do not want to undergo cardiac surgery, alcohol septal ablation is the treatment of choice. Alcohol septal ablation is less invasive than surgical myectomy and its efficacy in reducing symptoms is similar to surgical myectomy (90 versus 96 percent have a reduction in symptoms with alcohol septal ablation and surgical myectomy, respectively).  Septal myectomy evaluation - other surgical indications: ***  Procedural/Surgical Clinical trial evaluation: ***  Genetic Therapy discussion: ***  [email or web address]       {Are you ordering a CV Procedure (e.g. stress test, cath, DCCV, TEE, etc)?   Press F2        :[id number]}    Medication Adjustments/Labs and Tests Ordered: Current medicines are reviewed at length with the patient today.  Concerns regarding medicines are outlined above.  No orders of the defined types were placed in this encounter.  No orders of the defined types were placed in this encounter.   There are no Patient Instructions on file for this visit.   Signed, Christell Constant, MD  11/21/2022 7:27 AM    [facility]

## 2022-11-24 ENCOUNTER — Ambulatory Visit: Payer: Medicare PPO | Attending: Cardiology | Admitting: Cardiology

## 2022-11-24 ENCOUNTER — Other Ambulatory Visit: Payer: Self-pay

## 2022-11-24 ENCOUNTER — Encounter: Payer: Self-pay | Admitting: Cardiology

## 2022-11-24 ENCOUNTER — Other Ambulatory Visit (HOSPITAL_COMMUNITY): Payer: Self-pay

## 2022-11-24 VITALS — BP 112/68 | HR 96 | Ht 64.0 in | Wt 183.6 lb

## 2022-11-24 DIAGNOSIS — I421 Obstructive hypertrophic cardiomyopathy: Secondary | ICD-10-CM | POA: Diagnosis not present

## 2022-11-24 DIAGNOSIS — I10 Essential (primary) hypertension: Secondary | ICD-10-CM

## 2022-11-24 DIAGNOSIS — I480 Paroxysmal atrial fibrillation: Secondary | ICD-10-CM

## 2022-11-24 DIAGNOSIS — I5032 Chronic diastolic (congestive) heart failure: Secondary | ICD-10-CM

## 2022-11-24 MED ORDER — TORSEMIDE 20 MG PO TABS
40.0000 mg | ORAL_TABLET | ORAL | 3 refills | Status: DC
Start: 2022-11-24 — End: 2022-12-31
  Filled 2022-11-24: qty 45, 45d supply, fill #0

## 2022-11-24 MED ORDER — POTASSIUM CHLORIDE CRYS ER 20 MEQ PO TBCR
20.0000 meq | EXTENDED_RELEASE_TABLET | ORAL | 3 refills | Status: DC
Start: 2022-11-24 — End: 2022-12-31
  Filled 2022-11-24: qty 45, 90d supply, fill #0

## 2022-11-24 NOTE — Patient Instructions (Signed)
Medication Instructions:  Start Torsemide 40 mg every other day  Start KCL  Every other day *If you need a refill on your cardiac medications before your next appointment, please call your pharmacy*   Lab Work: CMET, MG toady If you have labs (blood work) drawn today and your tests are completely normal, you will receive your results only by: MyChart Message (if you have MyChart) OR A paper copy in the mail If you have any lab test that is abnormal or we need to change your treatment, we will call you to review the results.   Testing/Procedures: none   Follow-Up: At [facility], you and your health needs are our priority.  As part of our continuing mission to provide you with exceptional heart care, we have created designated Provider Care Teams.  These Care Teams include your primary Cardiologist (physician) and Advanced Practice Providers (APPs -  Physician Assistants and Nurse Practitioners) who all work together to provide you with the care you need, when you need it.   Your next appointment:   8 week(s)  Provider:   Thomasene Ripple, DO

## 2022-11-24 NOTE — Progress Notes (Signed)
Cardiology Office Note:    Date:  11/24/2022   ID:  Christine Jacobson, DOB [birth year], MRN [id number]  PCP:  Karenann Cai, NP  Cardiologist:  Thomasene Ripple, DO  Electrophysiologist:  None   Referring MD: Karenann Cai, NP   " I am really short of breath"  History of Present Illness:    Christine Jacobson is a 67 y.o. female with a hx of  obesity, hypertrophic cardiomyopathy with intracavity gradient of 64 mmHg which increased with Valsalva, chronic diastolic heart failure grade 2 diastolic dysfunction, paroxysmal atrial fibrillation status post cardioversion, hypertension, hypercholesteremia is here today for follow-up visit.  At her last visit.  She was significantly short of breath and the patient to the hospital for admission.  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  She was referred to our hypertrophic cardiomyopathy clinic.  Fortunately she did not attend that visit.  She is having nurses come to her house to help help with her leg.  She is teary in the office today and crying significantly confused that she feels as a ping-pong ball and notably.  They report receiving conflicting information from different nurses about the healing process of their leg, leading to increased anxiety and frustration. The patient also expresses feelings of depression, stating they feel like they are "just existing" and not living due to their health issues. They report being hospitalized four times recently, further contributing to their distress. The patient also mentions a history of low blood pressure and lightheadedness, which led to the discontinuation of their water pill, torsemide.   Past Medical History:  Diagnosis Date   Acquired thrombophilia (HCC) 08/05/2022   Chest pain 11/22/2010   2D STRESS ECHO - EF 60%, peak  stress EF 80%, normal, no evidence for stress-induced ischemia   HOCM (hypertrophic obstructive cardiomyopathy) (HCC) 06/21/2011   2D ECHO - EF >55%, normal   HTN (hypertension)    Hyperprolactinemia (HCC)    dx in her 18, took meds, self d/c a while back   Liver function test abnormality    normal when repeated   Mild hyperlipidemia    Obesity    Palpitations    negative stress echo in November 2012 with normal LV function; mild LVH, proximal septal thickening with narrow LVOT and mild gradient; mild MR and TR; Cardionet showed PACs in November 2012   Pyoderma gangrenosa    Shortness of breath 07/11/2011   MET TEST    Past Surgical History:  Procedure Laterality Date   BIOPSY  08/10/2022   Procedure: BIOPSY;  Surgeon: Willis Modena, MD;  Location: [facility];  Service: Gastroenterology;;   CARDIOVERSION N/A 02/01/2022   Procedure: CARDIOVERSION;  Surgeon: Thomasene Ripple, DO;  Location: [facility];  Service: Cardiovascular;  Laterality: N/A;   CARDIOVERSION N/A 09/20/2022   Procedure: CARDIOVERSION;  Surgeon: Dolores Patty, MD;  Location: [facility];  Service: Cardiovascular;  Laterality: N/A;   COLONOSCOPY WITH PROPOFOL Left 08/10/2022   Procedure: COLONOSCOPY WITH PROPOFOL;  Surgeon: Willis Modena, MD;  Location: [facility];  Service: Gastroenterology;  Laterality: Left;   ESOPHAGOGASTRODUODENOSCOPY (EGD) WITH PROPOFOL Left 08/10/2022   Procedure: ESOPHAGOGASTRODUODENOSCOPY (EGD) WITH PROPOFOL;  Surgeon: Willis Modena, MD;  Location: [facility];  Service: Gastroenterology;  Laterality: Left;   POLYPECTOMY  08/10/2022   Procedure: POLYPECTOMY;  Surgeon: Willis Modena, MD;  Location: [facility];  Service: Gastroenterology;;   RIGHT HEART CATH N/A 10/15/2021   Procedure: RIGHT HEART CATH;  Surgeon: Elder Negus, MD;  Location: [facility];  Service: Cardiovascular;  Laterality: N/A;   SKIN GRAFT  2006   porcine, R leg   TEE WITHOUT CARDIOVERSION  N/A 09/20/2022   Procedure: TRANSESOPHAGEAL ECHOCARDIOGRAM;  Surgeon: Dolores Patty, MD;  Location: [facility];  Service: Cardiovascular;  Laterality: N/A;    Current Medications: Current Meds  Medication Sig   apixaban (ELIQUIS) 5 MG TABS tablet Take 1 tablet (5 mg total) by mouth 2 (two) times daily.   ascorbic acid (VITAMIN C) 500 MG tablet Take 1 tablet (500 mg total) by mouth daily for 30 days   atorvastatin (LIPITOR) 80 MG tablet Take 1 tablet (80 mg total) by mouth daily.   empagliflozin (JARDIANCE) 10 MG TABS tablet Take 1 tablet (10 mg total) by mouth daily.   escitalopram (LEXAPRO) 5 MG tablet Take 1 tablet (5 mg total) by mouth daily.   hydrOXYzine (ATARAX) 10 MG tablet Take 1 tablet (10 mg total) by mouth at bedtime.   metoprolol succinate (TOPROL-XL) 25 MG 24 hr tablet Take 1 tablet (25 mg total) by mouth daily.   midodrine (PROAMATINE) 5 MG tablet Take 1 tablet (5 mg total) by mouth 2 (two) times daily.   potassium chloride SA (KLOR-CON M20) 20 MEQ tablet Take 1 tablet (20 mEq total) by mouth every other day.   torsemide (DEMADEX) 20 MG tablet Take 2 tablets (40 mg total) by mouth every other day.   Zinc Sulfate 220 (50 Zn) MG TABS Take 1 tablet (220 mg total) by mouth daily for 30 days     Allergies:   Pork-derived products   Social History   Socioeconomic History   Marital status: Single    Spouse name: Not on file   Number of children: 0   Years of education: Not on file   Highest education level: Not on file  Occupational History   Occupation: para Psychologist, forensic, part time cook  Tobacco Use   Smoking status: Former    Current packs/day: 1.00    Average packs/day: 1 pack/day for 4.0 years (4.0 ttl pk-yrs)    Types: Cigarettes   Smokeless tobacco: Never  Vaping Use   Vaping status: Never Used  Substance and Sexual Activity   Alcohol use: Not Currently    Alcohol/week: 1.0 standard drink of alcohol    Types: 1 Glasses of wine per week     Comment: "I do have my wine" every day, has cut down from 2 bottles at a time, currently 2-3 glasses   Drug use: No   Sexual activity: Not Currently  Other Topics Concern   Not on file  Social History Narrative   Lives by self   Social Determinants of Health   Financial Resource Strain: Low Risk  (12/03/2021)   Overall Financial Resource Strain (CARDIA)    Difficulty of Paying Living Expenses: Not very hard  Food Insecurity: No Food Insecurity (09/15/2022)   Hunger Vital Sign    Worried About Running Out of Food in the Last Year: Never true    Ran Out of Food in the Last Year: Never true  Transportation Needs: No Transportation Needs (09/15/2022)   PRAPARE - Administrator, Civil Service (Medical): No    Lack of Transportation (Non-Medical): No  Physical Activity: Not on file  Stress: Not on file  Social Connections: Not on  file     Family History: The patient's family history includes Coronary artery disease in an other family member; Diabetes in an other family member; Emphysema in her father; Heart disease in her mother; Ovarian cancer in her mother. There is no history of Cancer.  ROS:   Review of Systems  Constitution: Negative for decreased appetite, fever and weight gain.  HENT: Negative for congestion, ear discharge, hoarse voice and sore throat.   Eyes: Negative for discharge, redness, vision loss in right eye and visual halos.  Cardiovascular: Negative for chest pain, dyspnea on exertion, leg swelling, orthopnea and palpitations.  Respiratory: Negative for cough, hemoptysis, shortness of breath and snoring.   Endocrine: Negative for heat intolerance and polyphagia.  Hematologic/Lymphatic: Negative for bleeding problem. Does not bruise/bleed easily.  Skin: Negative for flushing, nail changes, rash and suspicious lesions.  Musculoskeletal: Negative for arthritis, joint pain, muscle cramps, myalgias, neck pain and stiffness.  Gastrointestinal: Negative for  abdominal pain, bowel incontinence, diarrhea and excessive appetite.  Genitourinary: Negative for decreased libido, genital sores and incomplete emptying.  Neurological: Negative for brief paralysis, focal weakness, headaches and loss of balance.  Psychiatric/Behavioral: Negative for altered mental status, depression and suicidal ideas.  Allergic/Immunologic: Negative for HIV exposure and persistent infections.    EKGs/Labs/Other Studies Reviewed:    The following studies were reviewed today:   EKG: Atrial fibrillation with controlled ventricular rate  Recent Labs: 08/14/2022: ALT 21 09/16/2022: TSH 0.560 09/29/2022: B Natriuretic Peptide 538.7; Hemoglobin 8.6; Platelets 259 09/30/2022: BUN 15; Creatinine, Ser 1.00; Magnesium 1.8; Potassium 3.5; Sodium 138  Recent Lipid Panel    Component Value Date/Time   CHOL 228 (H) 06/03/2022 0028   TRIG 172 (H) 06/03/2022 0028   HDL 48 06/03/2022 0028   CHOLHDL 4.8 06/03/2022 0028   VLDL 34 06/03/2022 0028   LDLCALC 146 (H) 06/03/2022 0028   LDLDIRECT 121.8 05/26/2011 0928    Physical Exam:    VS:  BP 112/68 (BP Location: Right Arm, Patient Position: Sitting, Cuff Size: Normal)   Pulse 96   Ht 5\' 4"  (1.626 m)   Wt 183 lb 9.6 oz (83.3 kg)   SpO2 96%   BMI 31.51 kg/m     Wt Readings from Last 3 Encounters:  11/24/22 183 lb 9.6 oz (83.3 kg)  09/21/22 183 lb (83 kg)  09/15/22 185 lb 9.6 oz (84.2 kg)     GEN: Significantly fluid overloaded/anasarcic HEENT: Normal NECK: No JVD; No carotid bruits LYMPHATICS: No lymphadenopathy CARDIAC: S1S2 noted,RRR, no murmurs, rubs, gallops RESPIRATORY:  Clear to auscultation without rales, wheezing or rhonchi  ABDOMEN: Soft, non-tender, non-distended, +bowel sounds, no guarding. EXTREMITIES: Significant bilateral edema, No cyanosis, no clubbing MUSCULOSKELETAL:  No deformity  SKIN: Warm and dry NEUROLOGIC:  Alert and oriented x 3, non-focal PSYCHIATRIC:  Normal affect, good  insight  ASSESSMENT:    1. Chronic diastolic heart failure (HCC)   2. Primary hypertension   3. HOCM (hypertrophic obstructive cardiomyopathy) (HCC)   4. PAF (paroxysmal atrial fibrillation) (HCC)     PLAN:    Hypertrophic Cardiomyopathy Recurrent heart failure exacerbations despite current medical therapy and AFib management. Discussed potential benefit of Camzyos antibody, and referral to Dr. Freda Jackson, a specialist in hypertrophic cardiomyopathy. -Reschedule appointment with Dr. Freda Jackson.  Lower Extremity Edema worsening with chronic diastolic heart failure Patient reports conflicting information from home health nurses regarding healing progress of leg. Discontinued torsemide due to low blood pressure and lightheadedness, but swelling has increased. -Resume torsemide cautiously, every other  day. -Check kidney function today. -Follow-up in 8 weeks to assess response to adjusted torsemide regimen.  Paroxysmal atrial fibrillation continue with metoprolol succinate and Eliquis 5 mg twice daily.  Depression Patient acknowledges feeling depressed, expressing feelings of being overwhelmed and lacking control over health situation. Encourage patient to consider speaking with a mental health professional.    Medication Adjustments/Labs and Tests Ordered: Current medicines are reviewed at length with the patient today.  Concerns regarding medicines are outlined above.  Orders Placed This Encounter  Procedures   Comprehensive Metabolic Panel (CMET)   Magnesium   Meds ordered this encounter  Medications   torsemide (DEMADEX) 20 MG tablet    Sig: Take 2 tablets (40 mg total) by mouth every other day.    Dispense:  45 tablet    Refill:  3   potassium chloride SA (KLOR-CON M20) 20 MEQ tablet    Sig: Take 1 tablet (20 mEq total) by mouth every other day.    Dispense:  45 tablet    Refill:  3    Patient Instructions  Medication Instructions:  Start Torsemide 40 mg  every other day  Start KCL  Every other day *If you need a refill on your cardiac medications before your next appointment, please call your pharmacy*   Lab Work: CMET, MG toady If you have labs (blood work) drawn today and your tests are completely normal, you will receive your results only by: MyChart Message (if you have MyChart) OR A paper copy in the mail If you have any lab test that is abnormal or we need to change your treatment, we will call you to review the results.   Testing/Procedures: none   Follow-Up: At [facility], you and your health needs are our priority.  As part of our continuing mission to provide you with exceptional heart care, we have created designated Provider Care Teams.  These Care Teams include your primary Cardiologist (physician) and Advanced Practice Providers (APPs -  Physician Assistants and Nurse Practitioners) who all work together to provide you with the care you need, when you need it.   Your next appointment:   8 week(s)  Provider:   Thomasene Ripple, DO      Adopting a Healthy Lifestyle.  Know what a healthy weight is for you (roughly BMI <25) and aim to maintain this   Aim for 7+ servings of fruits and vegetables daily   65-80+ fluid ounces of water or unsweet tea for healthy kidneys   Limit to max 1 drink of alcohol per day; avoid smoking/tobacco   Limit animal fats in diet for cholesterol and heart health - choose grass fed whenever available   Avoid highly processed foods, and foods high in saturated/trans fats   Aim for low stress - take time to unwind and care for your mental health   Aim for 150 min of moderate intensity exercise weekly for heart health, and weights twice weekly for bone health   Aim for 7-9 hours of sleep daily   When it comes to diets, agreement about the perfect plan isnt easy to find, even among the experts. Experts at the [facility] [employer] developed an idea known as the  Healthy Eating Plate. Just imagine a plate divided into logical, healthy portions.   The emphasis is on diet quality:   Load up on vegetables and fruits - one-half of your plate: Aim for color and variety, and remember that potatoes dont count.  Go for whole grains - one-quarter of your plate: Whole wheat, barley, wheat berries, quinoa, oats, brown rice, and foods made with them. If you want pasta, go with whole wheat pasta.   Protein power - one-quarter of your plate: Fish, chicken, beans, and nuts are all healthy, versatile protein sources. Limit red meat.   The diet, however, does go beyond the plate, offering a few other suggestions.   Use healthy plant oils, such as olive, canola, soy, corn, sunflower and peanut. Check the labels, and avoid partially hydrogenated oil, which have unhealthy trans fats.   If youre thirsty, drink water. Coffee and tea are good in moderation, but skip sugary drinks and limit milk and dairy products to one or two daily servings.   The type of carbohydrate in the diet is more important than the amount. Some sources of carbohydrates, such as vegetables, fruits, whole grains, and beans-are healthier than others.   Finally, stay active  Signed, Thomasene Ripple, DO  11/24/2022 2:22 PM    [facility]

## 2022-11-25 LAB — MAGNESIUM: Magnesium: 2.2 mg/dL (ref 1.6–2.3)

## 2022-11-25 LAB — COMPREHENSIVE METABOLIC PANEL
ALT: 14 [IU]/L (ref 0–32)
AST: 18 [IU]/L (ref 0–40)
Albumin: 3.7 g/dL — ABNORMAL LOW (ref 3.9–4.9)
Alkaline Phosphatase: 277 [IU]/L — ABNORMAL HIGH (ref 44–121)
BUN/Creatinine Ratio: 19 (ref 12–28)
BUN: 24 mg/dL (ref 8–27)
Bilirubin Total: 0.5 mg/dL (ref 0.0–1.2)
CO2: 15 mmol/L — ABNORMAL LOW (ref 20–29)
Calcium: 9.8 mg/dL (ref 8.7–10.3)
Chloride: 112 mmol/L — ABNORMAL HIGH (ref 96–106)
Creatinine, Ser: 1.29 mg/dL — ABNORMAL HIGH (ref 0.57–1.00)
Globulin, Total: 2.4 g/dL (ref 1.5–4.5)
Glucose: 109 mg/dL — ABNORMAL HIGH (ref 70–99)
Potassium: 5.4 mmol/L — ABNORMAL HIGH (ref 3.5–5.2)
Sodium: 143 mmol/L (ref 134–144)
Total Protein: 6.1 g/dL (ref 6.0–8.5)
eGFR: 45 mL/min/{1.73_m2} — ABNORMAL LOW (ref >59)

## 2022-11-28 ENCOUNTER — Encounter (HOSPITAL_COMMUNITY): Payer: Medicare PPO

## 2022-11-29 ENCOUNTER — Telehealth: Payer: Self-pay | Admitting: Cardiology

## 2022-11-29 DIAGNOSIS — I10 Essential (primary) hypertension: Secondary | ICD-10-CM

## 2022-11-29 NOTE — Telephone Encounter (Signed)
Called and spoke to patient . She still has not taken her Lasix at this time. She stated she was getting up to take it as we were speaking. I did call and speak to Angelique Blonder to inform her of patient may need to be encouraged to take her Lasix.

## 2022-11-29 NOTE — Telephone Encounter (Signed)
Received an call from [facility] with [facility] concerning patient. She report patient HR was 96-125 this morning.  She stated patient took about 5 steps and her heart rate was 125 and she was out of breath and clammy. Her O2 sat is 98-99% on RA. She stated patient has not been taking Lasix nor Potassium  as instructed. Called and spoke  patient and she stated that she just received Lasix and Potassium yesterday because she was waiting for it to be delivered. She does not want to take Lasix because she is afraid she is not going to make it the the bathroom in time. Advised patient to take Lasix at this time and she stated she would. Patient current wight she report is 173 lbs and last weight in office on 11/7 was 183 lb 9.6 oz. She also has edema with weeping in both legs per [place]. Patient was told to hold Potassium for 2 days and return to office on Monday due to lab value being elevate per Dr Servando Salina. Patient verbalized understanding and agree. Please advise.

## 2022-11-29 NOTE — Telephone Encounter (Signed)
Denise with [facility] called in stating pt's hr was 122 today and she was having some SOB. She states it did get under 100 for a couple minutes. Please advise.

## 2022-12-07 NOTE — Progress Notes (Unsigned)
Office Note     CC:  follow up Requesting Provider:  Ceasar Lund, PA  HPI: Christine Jacobson is a 67 y.o. (09-21-1955) female who presents for evaluation of bilateral lower extremity edema.  Patient presented to the office in Unna boots of bilateral lower extremities.  She has been receiving home health for Unna boot dressing changes over the past several weeks.  She is also actively being diuresed with her cardiologist due to congestive heart failure.  She is overwhelmed with the status of her health currently.  She denies any history of DVT, prior venous ulcerations, trauma, or prior vascular interventions.  She has never been diagnosed with lymphedema.  Previously she did not wear compression or focus on leg elevation.  She is a former smoker.   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   2D ECHO - EF >55%, normal   HTN (hypertension)    Hyperprolactinemia (HCC)    dx in her 18, took meds, self d/c a while back   Liver function test abnormality    normal when repeated   Mild hyperlipidemia    Obesity    Palpitations    negative stress echo in November 2012 with normal LV function; mild LVH, proximal septal thickening with narrow LVOT and mild gradient; mild MR and TR; Cardionet showed PACs in November 2012   Pyoderma gangrenosa    Shortness of breath 07/11/2011   MET TEST    Past Surgical History:  Procedure Laterality Date   BIOPSY  08/10/2022   Procedure: BIOPSY;  Surgeon: Willis Modena, MD;  Location: [facility];  Service: Gastroenterology;;   CARDIOVERSION N/A 02/01/2022   Procedure: CARDIOVERSION;  Surgeon: Thomasene Ripple, DO;  Location: [facility];  Service: Cardiovascular;  Laterality: N/A;   CARDIOVERSION N/A 09/20/2022   Procedure:  CARDIOVERSION;  Surgeon: Dolores Patty, MD;  Location: [facility];  Service: Cardiovascular;  Laterality: N/A;   COLONOSCOPY WITH PROPOFOL Left 08/10/2022   Procedure: COLONOSCOPY WITH PROPOFOL;  Surgeon: Willis Modena, MD;  Location: [facility];  Service: Gastroenterology;  Laterality: Left;   ESOPHAGOGASTRODUODENOSCOPY (EGD) WITH PROPOFOL Left 08/10/2022   Procedure: ESOPHAGOGASTRODUODENOSCOPY (EGD) WITH PROPOFOL;  Surgeon: Willis Modena, MD;  Location: [facility];  Service: Gastroenterology;  Laterality: Left;   POLYPECTOMY  08/10/2022   Procedure: POLYPECTOMY;  Surgeon: Willis Modena, MD;  Location: [facility];  Service: Gastroenterology;;   RIGHT HEART CATH N/A 10/15/2021   Procedure: RIGHT HEART CATH;  Surgeon: Elder Negus, MD;  Location: [facility];  Service: Cardiovascular;  Laterality: N/A;   SKIN GRAFT  2006   porcine, R leg   TEE WITHOUT CARDIOVERSION N/A 09/20/2022   Procedure: TRANSESOPHAGEAL ECHOCARDIOGRAM;  Surgeon: Dolores Patty, MD;  Location: [facility];  Service: Cardiovascular;  Laterality: N/A;    Social History   Socioeconomic History   Marital status: Single    Spouse name: Not on file   Number of children: 0   Years of education: Not on file   Highest education level: Not on file  Occupational History   Occupation: para Psychologist, forensic, part time cook  Tobacco Use   Smoking status: Former    Current packs/day: 1.00    Average packs/day: 1 pack/day for 4.0 years (4.0 ttl pk-yrs)  Types: Cigarettes   Smokeless tobacco: Never  Vaping Use   Vaping status: Never Used  Substance and Sexual Activity   Alcohol use: Not Currently    Alcohol/week: 1.0 standard drink of alcohol    Types: 1 Glasses of wine per week    Comment: "I do have my wine" every day, has cut down from 2 bottles at a time, currently 2-3 glasses   Drug use: No   Sexual activity: Not Currently  Other Topics Concern   Not on file  Social  History Narrative   Lives by self   Social Determinants of Health   Financial Resource Strain: Low Risk  (12/03/2021)   Overall Financial Resource Strain (CARDIA)    Difficulty of Paying Living Expenses: Not very hard  Food Insecurity: No Food Insecurity (09/15/2022)   Hunger Vital Sign    Worried About Running Out of Food in the Last Year: Never true    Ran Out of Food in the Last Year: Never true  Transportation Needs: No Transportation Needs (09/15/2022)   PRAPARE - Administrator, Civil Service (Medical): No    Lack of Transportation (Non-Medical): No  Physical Activity: Not on file  Stress: Not on file  Social Connections: Not on file  Intimate Partner Violence: Not At Risk (09/15/2022)   Humiliation, Afraid, Rape, and Kick questionnaire    Fear of Current or Ex-Partner: No    Emotionally Abused: No    Physically Abused: No    Sexually Abused: No    Family History  Problem Relation Age of Onset   Heart disease Mother        endocarditis   Ovarian cancer Mother    Emphysema Father    Coronary artery disease Other        F in his 76   Diabetes Other        GP   Cancer Neg Hx     Current Outpatient Medications  Medication Sig Dispense Refill   apixaban (ELIQUIS) 5 MG TABS tablet Take 1 tablet (5 mg total) by mouth 2 (two) times daily. 60 tablet 5   ascorbic acid (VITAMIN C) 500 MG tablet Take 1 tablet (500 mg total) by mouth daily for 30 days 100 tablet 0   atorvastatin (LIPITOR) 80 MG tablet Take 1 tablet (80 mg total) by mouth daily. 30 tablet 0   empagliflozin (JARDIANCE) 10 MG TABS tablet Take 1 tablet (10 mg total) by mouth daily. 30 tablet 0   escitalopram (LEXAPRO) 5 MG tablet Take 1 tablet (5 mg total) by mouth daily. 30 tablet 0   hydrOXYzine (ATARAX) 10 MG tablet Take 1 tablet (10 mg total) by mouth at bedtime. 90 tablet 0   metoprolol succinate (TOPROL-XL) 25 MG 24 hr tablet Take 1 tablet (25 mg total) by mouth daily. 30 tablet 0   midodrine  (PROAMATINE) 5 MG tablet Take 1 tablet (5 mg total) by mouth 2 (two) times daily. 180 tablet 3   mineral oil-hydrophilic petrolatum (AQUAPHOR) ointment Apply topically as needed for dry skin. Use with Unna boots please at the back of the wounds on the lower extremities when the Unna boots are changed 420 g 0   pantoprazole (PROTONIX) 40 MG tablet Take 1 tablet (40 mg total) by mouth daily. 90 tablet 3   potassium chloride SA (KLOR-CON M20) 20 MEQ tablet Take 1 tablet (20 mEq total) by mouth every other day. 45 tablet 3   torsemide (DEMADEX) 20 MG tablet Take  2 tablets (40 mg total) by mouth every other day. 45 tablet 3   Zinc Sulfate 220 (50 Zn) MG TABS Take 1 tablet (220 mg total) by mouth daily for 30 days 100 tablet 0   No current facility-administered medications for this visit.    Allergies  Allergen Reactions   Pork-Derived Products Other (See Comments)    Religious preference     REVIEW OF SYSTEMS:   [X]  denotes positive finding, [ ]  denotes negative finding Cardiac  Comments:  Chest pain or chest pressure:    Shortness of breath upon exertion:    Short of breath when lying flat:    Irregular heart rhythm:        Vascular    Pain in calf, thigh, or hip brought on by ambulation:    Pain in feet at night that wakes you up from your sleep:     Blood clot in your veins:    Leg swelling:         Pulmonary    Oxygen at home:    Productive cough:     Wheezing:         Neurologic    Sudden weakness in arms or legs:     Sudden numbness in arms or legs:     Sudden onset of difficulty speaking or slurred speech:    Temporary loss of vision in one eye:     Problems with dizziness:         Gastrointestinal    Blood in stool:     Vomited blood:         Genitourinary    Burning when urinating:     Blood in urine:        Psychiatric    Major depression:         Hematologic    Bleeding problems:    Problems with blood clotting too easily:        Skin    Rashes or  ulcers:        Constitutional    Fever or chills:      PHYSICAL EXAMINATION:  Vitals:   12/08/22 1106  BP: 107/76  Pulse: 90  Temp: (!) 97.2 F (36.2 C)  TempSrc: Temporal  SpO2: 95%  Weight: 184 lb (83.5 kg)  Height: 5\' 4"  (1.626 m)    General:  WDWN in NAD; vital signs documented above Gait: Not observed HENT: WNL, normocephalic Pulmonary: normal non-labored breathing , without Rales, rhonchi,  wheezing Cardiac: regular HR Abdomen: soft, NT, no masses Skin: without rashes Vascular Exam/Pulses: palpable DP pulses Extremities: without ischemic changes, without Gangrene , without cellulitis; without open wounds;  Musculoskeletal: no muscle wasting or atrophy  Neurologic: A&O X 3 Psychiatric:  The pt has Normal affect.    Non-Invasive Vascular Imaging:    Venous Reflux Times  +--------------+---------+------+-----------+------------+--------+  RIGHT        Reflux NoRefluxReflux TimeDiameter cmsComments                          Yes                                   +--------------+---------+------+-----------+------------+--------+  CFV          no                                              +--------------+---------+------+-----------+------------+--------+  FV mid        no                                              +--------------+---------+------+-----------+------------+--------+  Popliteal    no                                              +--------------+---------+------+-----------+------------+--------+  GSV at SFJ              yes    >500 ms      0.51              +--------------+---------+------+-----------+------------+--------+  GSV prox thigh          yes    >500 ms      0.77              +--------------+---------+------+-----------+------------+--------+  GSV mid thigh           yes    >500 ms      0.48              +--------------+---------+------+-----------+------------+--------+  GSV dist  thigh          yes    >500 ms      0.49              +--------------+---------+------+-----------+------------+--------+  GSV at knee             yes    >500 ms      0.41              +--------------+---------+------+-----------+------------+--------+  GSV prox calf           yes    >500 ms      0.31              +--------------+---------+------+-----------+------------+--------+  GSV mid calf  no                            0.23              +--------------+---------+------+-----------+------------+--------+  SSV Pop Fossa no                            0.33              +--------------+---------+------+-----------+------------+--------+  SSV prox calf no                            0.29              +--------------+---------+------+-----------+------------+--------+  SSV mid calf  no                            0.34              +--------------+---------+------+-----------+------------+--------+  AASV O                                              NV         ASSESSMENT/PLAN::  67 y.o. female here for evaluation of bilateral lower extremity edema  Ms. Christine Jacobson is a 67 year old female being evaluated for bilateral lower extremity edema.  She is actively being treated and diuresed due to CHF.  She has been short of breath with minimal activity lately and has been overwhelmed with her overall health.  She has been active with home health for weekly Unna boot changes over the past several weeks.  Unna boots were removed in the office today demonstrating tissue loss around both ankles.  Based on her pattern of swelling involving her feet and toes she likely has lymphedema.  Agree with continued use of Unna boots to bilateral lower extremities until ankles have completely healed.  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  She would likely be a candidate for right  greater saphenous vein ablation however currently she has no interest in pursuing any procedures.  The patient would prefer to focus on her CHF and fluid status.  If in the future patient would like to discuss management of her edema and venous insufficiency we will be happy to reevaluate her.  Patient may also benefit from referral to a lymphedema clinic to help manage her symptoms.  For now she will follow-up on an as-needed basis.   Emilie Rutter, PA-C [facility] [phone]  Clinic MD:   Karin Lieu

## 2022-12-08 ENCOUNTER — Ambulatory Visit: Payer: Medicare PPO | Admitting: Physician Assistant

## 2022-12-08 ENCOUNTER — Ambulatory Visit (HOSPITAL_COMMUNITY)
Admission: RE | Admit: 2022-12-08 | Discharge: 2022-12-08 | Disposition: A | Discharge status: Home / Self Care | Payer: Medicare PPO | Source: Ambulatory Visit | Attending: Physician Assistant | Admitting: Physician Assistant

## 2022-12-08 VITALS — BP 107/76 | HR 90 | Temp 97.2°F | Ht 64.0 in | Wt 184.0 lb

## 2022-12-08 DIAGNOSIS — L97919 Non-pressure chronic ulcer of unspecified part of right lower leg with unspecified severity: Secondary | ICD-10-CM

## 2022-12-08 DIAGNOSIS — I872 Venous insufficiency (chronic) (peripheral): Secondary | ICD-10-CM

## 2022-12-08 DIAGNOSIS — I87332 Chronic venous hypertension (idiopathic) with ulcer and inflammation of left lower extremity: Secondary | ICD-10-CM | POA: Insufficient documentation

## 2022-12-08 DIAGNOSIS — I83019 Varicose veins of right lower extremity with ulcer of unspecified site: Secondary | ICD-10-CM | POA: Diagnosis not present

## 2022-12-08 DIAGNOSIS — I87331 Chronic venous hypertension (idiopathic) with ulcer and inflammation of right lower extremity: Secondary | ICD-10-CM | POA: Diagnosis not present

## 2022-12-08 DIAGNOSIS — I890 Lymphedema, not elsewhere classified: Secondary | ICD-10-CM | POA: Diagnosis not present

## 2022-12-08 DIAGNOSIS — I83029 Varicose veins of left lower extremity with ulcer of unspecified site: Secondary | ICD-10-CM

## 2022-12-08 DIAGNOSIS — L97929 Non-pressure chronic ulcer of unspecified part of left lower leg with unspecified severity: Secondary | ICD-10-CM

## 2022-12-27 ENCOUNTER — Other Ambulatory Visit: Payer: Self-pay

## 2022-12-27 ENCOUNTER — Inpatient Hospital Stay (HOSPITAL_COMMUNITY)
Admission: EM | Admit: 2022-12-27 | Discharge: 2022-12-31 | DRG: 291 | Disposition: A | Discharge status: Home Health | Payer: Medicare PPO | Attending: Student | Admitting: Student

## 2022-12-27 ENCOUNTER — Encounter (HOSPITAL_COMMUNITY): Payer: Self-pay

## 2022-12-27 ENCOUNTER — Emergency Department (HOSPITAL_COMMUNITY): Payer: Medicare PPO

## 2022-12-27 DIAGNOSIS — Z7984 Long term (current) use of oral hypoglycemic drugs: Secondary | ICD-10-CM

## 2022-12-27 DIAGNOSIS — I5033 Acute on chronic diastolic (congestive) heart failure: Secondary | ICD-10-CM | POA: Diagnosis present

## 2022-12-27 DIAGNOSIS — I340 Nonrheumatic mitral (valve) insufficiency: Secondary | ICD-10-CM | POA: Diagnosis present

## 2022-12-27 DIAGNOSIS — I4819 Other persistent atrial fibrillation: Secondary | ICD-10-CM

## 2022-12-27 DIAGNOSIS — I421 Obstructive hypertrophic cardiomyopathy: Secondary | ICD-10-CM | POA: Diagnosis present

## 2022-12-27 DIAGNOSIS — E669 Obesity, unspecified: Secondary | ICD-10-CM | POA: Diagnosis present

## 2022-12-27 DIAGNOSIS — Z7901 Long term (current) use of anticoagulants: Secondary | ICD-10-CM | POA: Diagnosis not present

## 2022-12-27 DIAGNOSIS — Z91014 Allergy to mammalian meats: Secondary | ICD-10-CM

## 2022-12-27 DIAGNOSIS — E785 Hyperlipidemia, unspecified: Secondary | ICD-10-CM | POA: Diagnosis present

## 2022-12-27 DIAGNOSIS — I2722 Pulmonary hypertension due to left heart disease: Secondary | ICD-10-CM | POA: Diagnosis present

## 2022-12-27 DIAGNOSIS — E872 Acidosis, unspecified: Secondary | ICD-10-CM | POA: Diagnosis present

## 2022-12-27 DIAGNOSIS — F17210 Nicotine dependence, cigarettes, uncomplicated: Secondary | ICD-10-CM | POA: Diagnosis present

## 2022-12-27 DIAGNOSIS — Z8249 Family history of ischemic heart disease and other diseases of the circulatory system: Secondary | ICD-10-CM | POA: Diagnosis not present

## 2022-12-27 DIAGNOSIS — M7989 Other specified soft tissue disorders: Secondary | ICD-10-CM | POA: Diagnosis present

## 2022-12-27 DIAGNOSIS — R197 Diarrhea, unspecified: Secondary | ICD-10-CM | POA: Diagnosis not present

## 2022-12-27 DIAGNOSIS — Z8719 Personal history of other diseases of the digestive system: Secondary | ICD-10-CM

## 2022-12-27 DIAGNOSIS — I130 Hypertensive heart and chronic kidney disease with heart failure and stage 1 through stage 4 chronic kidney disease, or unspecified chronic kidney disease: Principal | ICD-10-CM | POA: Diagnosis present

## 2022-12-27 DIAGNOSIS — R600 Localized edema: Secondary | ICD-10-CM | POA: Diagnosis present

## 2022-12-27 DIAGNOSIS — N1831 Chronic kidney disease, stage 3a: Secondary | ICD-10-CM | POA: Diagnosis present

## 2022-12-27 DIAGNOSIS — Z6831 Body mass index (BMI) 31.0-31.9, adult: Secondary | ICD-10-CM

## 2022-12-27 DIAGNOSIS — Z79899 Other long term (current) drug therapy: Secondary | ICD-10-CM

## 2022-12-27 DIAGNOSIS — I272 Pulmonary hypertension, unspecified: Secondary | ICD-10-CM | POA: Diagnosis not present

## 2022-12-27 LAB — BRAIN NATRIURETIC PEPTIDE: B Natriuretic Peptide: 768 pg/mL — ABNORMAL HIGH (ref 0.0–100.0)

## 2022-12-27 LAB — COMPREHENSIVE METABOLIC PANEL
ALT: 26 U/L (ref 0–44)
AST: 30 U/L (ref 15–41)
Albumin: 3.4 g/dL — ABNORMAL LOW (ref 3.5–5.0)
Alkaline Phosphatase: 255 U/L — ABNORMAL HIGH (ref 38–126)
Anion gap: 9 (ref 5–15)
BUN: 27 mg/dL — ABNORMAL HIGH (ref 8–23)
CO2: 16 mmol/L — ABNORMAL LOW (ref 22–32)
Calcium: 9.2 mg/dL (ref 8.9–10.3)
Chloride: 111 mmol/L (ref 98–111)
Creatinine, Ser: 1.16 mg/dL — ABNORMAL HIGH (ref 0.44–1.00)
GFR, Estimated: 51 mL/min — ABNORMAL LOW (ref >60)
Glucose, Bld: 97 mg/dL (ref 70–99)
Potassium: 4.3 mmol/L (ref 3.5–5.1)
Sodium: 136 mmol/L (ref 135–145)
Total Bilirubin: 0.5 mg/dL (ref <1.2)
Total Protein: 6.8 g/dL (ref 6.5–8.1)

## 2022-12-27 LAB — CBC
HCT: 37.4 % (ref 36.0–46.0)
Hemoglobin: 11.1 g/dL — ABNORMAL LOW (ref 12.0–15.0)
MCH: 26.2 pg (ref 26.0–34.0)
MCHC: 29.7 g/dL — ABNORMAL LOW (ref 30.0–36.0)
MCV: 88.2 fL (ref 80.0–100.0)
Platelets: 225 10*3/uL (ref 150–400)
RBC: 4.24 MIL/uL (ref 3.87–5.11)
RDW: 18.3 % — ABNORMAL HIGH (ref 11.5–15.5)
WBC: 7.2 10*3/uL (ref 4.0–10.5)
nRBC: 0 % (ref 0.0–0.2)

## 2022-12-27 LAB — TROPONIN I (HIGH SENSITIVITY)
Troponin I (High Sensitivity): 17 ng/L (ref <18)
Troponin I (High Sensitivity): 15 ng/L (ref <18)

## 2022-12-27 MED ORDER — ACETAMINOPHEN 650 MG RE SUPP: 650.0000 mg | Freq: Four times a day (QID) | RECTAL | Status: DC | PRN

## 2022-12-27 MED ORDER — FUROSEMIDE 10 MG/ML IJ SOLN
40.0000 mg | Freq: Once | INTRAMUSCULAR | Status: AC
  Administered 2022-12-27: 40 mg via INTRAVENOUS
  Filled 2022-12-27: qty 4

## 2022-12-27 MED ORDER — ONDANSETRON HCL 4 MG PO TABS: 4.0000 mg | ORAL_TABLET | Freq: Four times a day (QID) | ORAL | Status: DC | PRN

## 2022-12-27 MED ORDER — POLYETHYLENE GLYCOL 3350 17 G PO PACK: 17.0000 g | PACK | Freq: Two times a day (BID) | ORAL | Status: DC | PRN

## 2022-12-27 MED ORDER — SENNOSIDES-DOCUSATE SODIUM 8.6-50 MG PO TABS: 1.0000 | ORAL_TABLET | Freq: Two times a day (BID) | ORAL | Status: DC | PRN

## 2022-12-27 MED ORDER — ACETAMINOPHEN 325 MG PO TABS: 650.0000 mg | ORAL_TABLET | Freq: Four times a day (QID) | ORAL | Status: DC | PRN

## 2022-12-27 MED ORDER — APIXABAN 5 MG PO TABS
5.0000 mg | ORAL_TABLET | Freq: Two times a day (BID) | ORAL | Status: DC
  Administered 2022-12-27 – 2022-12-31 (×8): 5 mg via ORAL
  Filled 2022-12-27 (×8): qty 1

## 2022-12-27 MED ORDER — OXYCODONE HCL 5 MG PO TABS
5.0000 mg | ORAL_TABLET | ORAL | Status: DC | PRN
  Administered 2022-12-27 – 2022-12-31 (×3): 5 mg via ORAL
  Filled 2022-12-27 (×3): qty 1

## 2022-12-27 MED ORDER — ONDANSETRON HCL 4 MG/2ML IJ SOLN
4.0000 mg | Freq: Four times a day (QID) | INTRAMUSCULAR | Status: DC | PRN
  Administered 2022-12-30: 4 mg via INTRAVENOUS
  Filled 2022-12-27: qty 2

## 2022-12-27 NOTE — ED Notes (Signed)
Report given.

## 2022-12-27 NOTE — H&P (Signed)
History and Physical    Patient: Christine Jacobson ZOX:[id number] DOB: March 30, 1955 DOA: 12/27/2022 DOS: the patient was seen and examined on 12/27/2022 PCP: Karenann Cai, NP  Patient coming from: Home.  Lives alone.  Uses rolling walker at baseline.  Chief Complaint:  Chief Complaint  Patient presents with   Wound Check   Leg Swelling   HPI: Christine Jacobson is a 67 y.o. female with PMH of diastolic CHF, HOCM, PAH, moderate TR, A-fib on Eliquis, HTN and obesity presented to ED with progressive right leg swelling.  Patient reports progressive right leg swelling over the last 24 to 48 hours.  She noted some weeping.  She had weekly RN visit today who advised going to ED for evaluation.  Patient reports chronic of breath and dyspnea with exertion.  Lately, she is sitting or sleeping most most of the time.  She denies orthopnea but sleeps on a sofa.  She reports compliance with diuretics and Eliquis although she does not tell how often she take them.  She denies fever, runny nose, sore throat, chest pain, cough, nausea, vomiting, abdominal pain or UTI symptoms.  Denies change in urine output.  Reports elevating her legs for most part.   Patient denies smoking cigarette.  Sometimes drinks about 3 glasses of wine but not every day.  Denies all withdrawal symptoms.  Denies recreational drug use.  Interested in cardiopulmonary resuscitation in event of sudden cardiopulmonary arrest.  In ED, vital signs stable.  Labs significant for bicarb of 16, Cr 1.16 (1.29 on 11/7), ALP 255, BNP 768, troponin 17, Hgb 11.1.  CXR with enlarged cardiac silhouette.  Received IV Lasix 40 mg x 1.  Admission requested for acute CHF exacerbation. Review of Systems: As mentioned in the history of present illness. All other systems reviewed and are negative. Past Medical History:  Diagnosis Date   Acquired thrombophilia (HCC) 08/05/2022   Chest pain 11/22/2010   2D STRESS ECHO - EF 60%, peak stress EF 80%,  normal, no evidence for stress-induced ischemia   HOCM (hypertrophic obstructive cardiomyopathy) (HCC) 06/21/2011   2D ECHO - EF >55%, normal   HTN (hypertension)    Hyperprolactinemia (HCC)    dx in her 18, took meds, self d/c a while back   Liver function test abnormality    normal when repeated   Mild hyperlipidemia    Obesity    Palpitations    negative stress echo in November 2012 with normal LV function; mild LVH, proximal septal thickening with narrow LVOT and mild gradient; mild MR and TR; Cardionet showed PACs in November 2012   Pyoderma gangrenosa    Shortness of breath 07/11/2011   MET TEST   Past Surgical History:  Procedure Laterality Date   BIOPSY  08/10/2022   Procedure: BIOPSY;  Surgeon: Willis Modena, MD;  Location: [facility];  Service: Gastroenterology;;   CARDIOVERSION N/A 02/01/2022   Procedure: CARDIOVERSION;  Surgeon: Thomasene Ripple, DO;  Location: [facility];  Service: Cardiovascular;  Laterality: N/A;   CARDIOVERSION N/A 09/20/2022   Procedure: CARDIOVERSION;  Surgeon: Dolores Patty, MD;  Location: [facility];  Service: Cardiovascular;  Laterality: N/A;   COLONOSCOPY WITH PROPOFOL Left 08/10/2022   Procedure: COLONOSCOPY WITH PROPOFOL;  Surgeon: Willis Modena, MD;  Location: [facility];  Service: Gastroenterology;  Laterality: Left;   ESOPHAGOGASTRODUODENOSCOPY (EGD) WITH PROPOFOL Left 08/10/2022   Procedure: ESOPHAGOGASTRODUODENOSCOPY (EGD) WITH PROPOFOL;  Surgeon: Willis Modena, MD;  Location: [facility];  Service: Gastroenterology;  Laterality: Left;  POLYPECTOMY  08/10/2022   Procedure: POLYPECTOMY;  Surgeon: Willis Modena, MD;  Location: [facility];  Service: Gastroenterology;;   RIGHT HEART CATH N/A 10/15/2021   Procedure: RIGHT HEART CATH;  Surgeon: Elder Negus, MD;  Location: [facility];  Service: Cardiovascular;  Laterality: N/A;   SKIN GRAFT  2006   porcine, R leg   TEE WITHOUT CARDIOVERSION N/A 09/20/2022    Procedure: TRANSESOPHAGEAL ECHOCARDIOGRAM;  Surgeon: Dolores Patty, MD;  Location: [facility];  Service: Cardiovascular;  Laterality: N/A;   Social History:  reports that she has quit smoking. Her smoking use included cigarettes. She has a 4 pack-year smoking history. She has never used smokeless tobacco. She reports that she does not currently use alcohol after a past usage of about 1.0 standard drink of alcohol per week. She reports that she does not use drugs.  Allergies  Allergen Reactions   Pork-Derived Products Other (See Comments)    Religious preference    Family History  Problem Relation Age of Onset   Heart disease Mother        endocarditis   Ovarian cancer Mother    Emphysema Father    Coronary artery disease Other        F in his 92   Diabetes Other        GP   Cancer Neg Hx     Prior to Admission medications   Medication Sig Start Date End Date Taking? Authorizing Provider  apixaban (ELIQUIS) 5 MG TABS tablet Take 1 tablet (5 mg total) by mouth 2 (two) times daily. 02/23/22   Tobb, Kardie, DO  ascorbic acid (VITAMIN C) 500 MG tablet Take 1 tablet (500 mg total) by mouth daily for 30 days 06/10/22   Leroy Sea, MD  atorvastatin (LIPITOR) 80 MG tablet Take 1 tablet (80 mg total) by mouth daily. 09/22/22   Alfredo Martinez, MD  empagliflozin (JARDIANCE) 10 MG TABS tablet Take 1 tablet (10 mg total) by mouth daily. 09/21/22   Everhart, Kirstie, DO  escitalopram (LEXAPRO) 5 MG tablet Take 1 tablet (5 mg total) by mouth daily. 09/22/22   Alfredo Martinez, MD  hydrOXYzine (ATARAX) 10 MG tablet Take 1 tablet (10 mg total) by mouth at bedtime. 09/21/22   Alfredo Martinez, MD  metoprolol succinate (TOPROL-XL) 25 MG 24 hr tablet Take 1 tablet (25 mg total) by mouth daily. 09/22/22   Alfredo Martinez, MD  midodrine (PROAMATINE) 5 MG tablet Take 1 tablet (5 mg total) by mouth 2 (two) times daily. 08/19/22     mineral oil-hydrophilic petrolatum (AQUAPHOR) ointment Apply topically as  needed for dry skin. Use with Unna boots please at the back of the wounds on the lower extremities when the Unna boots are changed 07/21/22   Rhetta Mura, MD  pantoprazole (PROTONIX) 40 MG tablet Take 1 tablet (40 mg total) by mouth daily. 08/19/22   Jerald Kief, MD  potassium chloride SA (KLOR-CON M20) 20 MEQ tablet Take 1 tablet (20 mEq total) by mouth every other day. 11/24/22 02/22/23  Tobb, Lavona Mound, DO  torsemide (DEMADEX) 20 MG tablet Take 2 tablets (40 mg total) by mouth every other day. 11/24/22   Tobb, Kardie, DO  Zinc Sulfate 220 (50 Zn) MG TABS Take 1 tablet (220 mg total) by mouth daily for 30 days 06/10/22   Leroy Sea, MD    Physical Exam: Vitals:   12/27/22 1615 12/27/22 1630 12/27/22 1815 12/27/22 1822  BP: 127/86 (!) 138/106  119/75   Pulse: 78 71 75   Resp:  16 16   Temp:    (!) 97.5 F (36.4 C)  TempSrc:   Oral Oral  SpO2: 100% 100% 100%   Weight:      Height:       GENERAL: No apparent distress.  Nontoxic. HEENT: MMM.  Vision and hearing grossly intact.  NECK: Supple.  No apparent JVD.  RESP:  No IWOB.  Fair aeration bilaterally. CVS:  RRR.  3/6 SEM over LUSB.  ABD/GI/GU: BS+. Abd soft, NTND.  MSK/EXT:   No apparent deformity.  Significant BLE edema with erythema and weeping in RLE.  No increased warmth to touch. SKIN: As above. NEURO: Awake and alert. Oriented appropriately.  No apparent focal neuro deficit. PSYCH: Calm. Normal affect.   Data Reviewed: See HPI  Assessment and Plan: Bilateral lower extremity edema: Significant BLE edema with erythema and weeping in RLE. Acute on chronic diastolic CHF: No significant cardiopulmonary symptoms other than some dyspnea, DOE and BLE edema.  Lungs clear.  CXR without acute finding.  BNP elevated to 768 (higher than prior value).  TTE in 08/2022 with LVEF of 65 to 70%, HOCM, RVSP of 64.8, massively dilated LAE, moderate to severe MR and moderate TR. however, TEE on 9/3 with LVEF of 70 to 75%, severe asymmetric  LVH, moderate TR, mild MR and normal RVSP.  Reports compliance with torsemide.  Received IV Lasix in ED -Redose diuretics in the morning.  Would be cautious given history of HOCM. -Consult cardiology in the morning -Check TSH -Strict intake and output, daily weight, renal functions and electrolyte -Wound care consulted.  Might benefit from Unna boot   Paroxysmal A-fib: Sinus rhythm.  On Toprol-XL and Eliquis at home. -Continue home Eliquis and Toprol-XL. -Optimize electrolytes  Non-anion gap metabolic acidosis: Unclear etiology -P.o. sodium bicarb  Hyperlipidemia -Resume home Lipitor after med rec  CKD-3A: Stable -Continue monitoring  Obesity: BMI 31.57.    Advance Care Planning:   Code Status: Full Code .  Discussed with patient  Consults: None  Family Communication: None at bedside  Severity of Illness: The appropriate patient status for this patient is INPATIENT. Inpatient status is judged to be reasonable and necessary in order to provide the required intensity of service to ensure the patient's safety. The patient's presenting symptoms, physical exam findings, and initial radiographic and laboratory data in the context of their chronic comorbidities is felt to place them at high risk for further clinical deterioration. Furthermore, it is not anticipated that the patient will be medically stable for discharge from the hospital within 2 midnights of admission.   * I certify that at the point of admission it is my clinical judgment that the patient will require inpatient hospital care spanning beyond 2 midnights from the point of admission due to high intensity of service, high risk for further deterioration and high frequency of surveillance required.*  Author: Almon Hercules, MD 12/27/2022 8:21 PM  For on call review [email or web address].

## 2022-12-27 NOTE — ED Provider Notes (Signed)
Patient in hallway with clothes and bilateral lower extremities wrapped x 2   Margarita Grizzle, MD 12/27/22 1542

## 2022-12-27 NOTE — ED Notes (Signed)
Patient is undressed and in a room as asked by doctor

## 2022-12-27 NOTE — ED Notes (Signed)
ED Provider at bedside. 

## 2022-12-27 NOTE — ED Provider Notes (Signed)
[place] EMERGENCY DEPARTMENT AT [facility] Provider Note   CSN: [id number] Arrival date & time: 12/27/22  1044     History Chief Complaint  Patient presents with   Wound Check   Leg Swelling    HPI Christine Jacobson is a 67 y.o. female presenting for a myriad of complaints.  Endorsing bilateral lower extremity swelling worsening over the past 72 hours.  Longstanding history of heart failure on torsemide 40 mg every other day.  However she endorses worsening urine output over the past week and resulting severe lower extremity swelling. She follows with the wound care clinic and today was her second visit.  They stated that she had severe interval progression recommend she come to the emergency department. She denies fevers or chills, nausea or vomiting.  She is endorsing ongoing shortness of breath that is progressive in nature..   Patient's recorded medical, surgical, social, medication list and allergies were reviewed in the Snapshot window as part of the initial history.   Review of Systems   Review of Systems  Constitutional:  Positive for fatigue. Negative for chills and fever.  HENT:  Negative for ear pain and sore throat.   Eyes:  Negative for pain and visual disturbance.  Respiratory:  Positive for cough and shortness of breath.   Cardiovascular:  Positive for leg swelling. Negative for chest pain and palpitations.  Gastrointestinal:  Negative for abdominal pain and vomiting.  Genitourinary:  Negative for dysuria and hematuria.  Musculoskeletal:  Negative for arthralgias and back pain.  Skin:  Positive for color change and wound. Negative for rash.  Neurological:  Positive for weakness. Negative for seizures and syncope.  All other systems reviewed and are negative.   Physical Exam Updated Vital Signs BP 119/75 (BP Location: Right Arm)   Pulse 75   Temp (!) 97.5 F (36.4 C) (Oral)   Resp 16   Ht 5\' 4"  (1.626 m)   Wt 83.4 kg   SpO2 100%   BMI  31.57 kg/m  Physical Exam Vitals and nursing note reviewed.  Constitutional:      General: She is not in acute distress.    Appearance: She is well-developed.  HENT:     Head: Normocephalic and atraumatic.  Eyes:     Conjunctiva/sclera: Conjunctivae normal.  Cardiovascular:     Rate and Rhythm: Normal rate and regular rhythm.     Heart sounds: No murmur heard. Pulmonary:     Effort: Pulmonary effort is normal. No respiratory distress.     Breath sounds: Rales present. No rhonchi.  Abdominal:     General: There is no distension.     Palpations: Abdomen is soft.     Tenderness: There is no abdominal tenderness. There is no right CVA tenderness or left CVA tenderness.  Musculoskeletal:        General: No swelling or tenderness. Normal range of motion.     Cervical back: Neck supple.     Right lower leg: Edema present.     Left lower leg: Edema present.  Skin:    General: Skin is warm and dry.     Findings: Erythema present.  Neurological:     General: No focal deficit present.     Mental Status: She is alert and oriented to person, place, and time. Mental status is at baseline.     Cranial Nerves: No cranial nerve deficit.      ED Course/ Medical Decision Making/ A&P    Procedures Procedures  Medications Ordered in ED Medications  oxyCODONE (Oxy IR/ROXICODONE) immediate release tablet 5 mg (5 mg Oral Given 12/27/22 1640)  furosemide (LASIX) injection 40 mg (has no administration in time range)    Medical Decision Making:    Christine Jacobson is a 67 y.o. female who presented to the ED today with multiple complaints detailed above.     Patient placed on continuous vitals and telemetry monitoring while in ED which was reviewed periodically.   Complete initial physical exam performed, notably the patient  was hemodynamically stable no acute distress.  Severe bilateral lower extremity edema with substantial erythema pictured above.      Reviewed and confirmed  nursing documentation for past medical history, family history, social history.    Initial Assessment:   With the patient's presentation of worsening bilateral lower extremity swelling, most likely diagnosis is venous stasis with developing stasis dermatitis likely secondary to volume overload in the setting of known heart failure. Other diagnoses were considered including (but not limited to) bilateral cellulitis, DVT, arterial ischemia. These are considered less likely due to history of present illness and physical exam findings.   This is most consistent with an acute life/limb threatening illness complicated by underlying chronic conditions.  Initial Plan:  Screening labs including CBC and Metabolic panel to evaluate for infectious or metabolic etiology of disease.  Urinalysis with reflex culture ordered to evaluate for UTI or relevant urologic/nephrologic pathology.  CXR to evaluate for structural/infectious intrathoracic pathology.  BNP/troponin/EKG to evaluate for cardiac pathology. Objective evaluation as below reviewed with plan for close reassessment  Initial Study Results:   Laboratory  All laboratory results reviewed without evidence of clinically relevant pathology.   Exceptions include: Elevated BNP  EKG EKG was reviewed independently. Rate, rhythm, axis, intervals all examined and without medically relevant abnormality. ST segments without concerns for elevations.    Radiology  All images reviewed independently. Agree with radiology report at this time.   DG Chest Portable 1 View  Result Date: 12/27/2022 CLINICAL DATA:  Short of breath, right leg edema EXAM: PORTABLE CHEST 1 VIEW COMPARISON:  09/29/2022 FINDINGS: Single frontal view of the chest demonstrates enlarged cardiac silhouette. No airspace disease, effusion, or pneumothorax. No acute bony abnormalities. IMPRESSION: 1. Enlarged cardiac silhouette.  No acute airspace disease. Electronically Signed   By: Sharlet Salina  M.D.   On: 12/27/2022 17:47       Reassessment and Plan:   Patient grossly volume overloaded on exam but no other focal pathology.  Consulted hospital medicine is in agreement with admission for diuresis.  Started on 40 mg IV Lasix with plan for reassessment of affect by inpatient team.   Disposition:   Based on the above findings, I believe this patient is stable for admission.    Patient/family educated about specific findings on our evaluation and explained exact reasons for admission.  Patient/family educated about clinical situation and time was allowed to answer questions.   Admission team communicated with and agreed with need for admission. Patient admitted. Patient ready to move at this time.     Emergency Department Medication Summary:   Medications  oxyCODONE (Oxy IR/ROXICODONE) immediate release tablet 5 mg (5 mg Oral Given 12/27/22 1640)  furosemide (LASIX) injection 40 mg (has no administration in time range)         Clinical Impression:  1. Leg swelling      Admit   Final Clinical Impression(s) / ED Diagnoses Final diagnoses:  Leg swelling    Rx /  DC Orders ED Discharge Orders     None         Glyn Ade, MD 12/27/22 [phone]

## 2022-12-27 NOTE — ED Triage Notes (Signed)
Patient BIB EMS for right leg swelling and wound on bil lower legs x 2 weeks. States right leg is now weeping. No fevers.

## 2022-12-28 DIAGNOSIS — I4819 Other persistent atrial fibrillation: Secondary | ICD-10-CM | POA: Diagnosis not present

## 2022-12-28 DIAGNOSIS — I5033 Acute on chronic diastolic (congestive) heart failure: Secondary | ICD-10-CM | POA: Diagnosis not present

## 2022-12-28 DIAGNOSIS — I272 Pulmonary hypertension, unspecified: Secondary | ICD-10-CM | POA: Diagnosis not present

## 2022-12-28 DIAGNOSIS — I421 Obstructive hypertrophic cardiomyopathy: Secondary | ICD-10-CM | POA: Diagnosis not present

## 2022-12-28 LAB — COMPREHENSIVE METABOLIC PANEL
ALT: 20 U/L (ref 0–44)
AST: 23 U/L (ref 15–41)
Albumin: 3 g/dL — ABNORMAL LOW (ref 3.5–5.0)
Alkaline Phosphatase: 197 U/L — ABNORMAL HIGH (ref 38–126)
Anion gap: 10 (ref 5–15)
BUN: 23 mg/dL (ref 8–23)
CO2: 18 mmol/L — ABNORMAL LOW (ref 22–32)
Calcium: 8.9 mg/dL (ref 8.9–10.3)
Chloride: 111 mmol/L (ref 98–111)
Creatinine, Ser: 0.92 mg/dL (ref 0.44–1.00)
GFR, Estimated: >60 mL/min (ref >60)
Glucose, Bld: 92 mg/dL (ref 70–99)
Potassium: 3.5 mmol/L (ref 3.5–5.1)
Sodium: 139 mmol/L (ref 135–145)
Total Bilirubin: 0.6 mg/dL (ref <1.2)
Total Protein: 6.1 g/dL — ABNORMAL LOW (ref 6.5–8.1)

## 2022-12-28 LAB — CBC
HCT: 36.2 % (ref 36.0–46.0)
Hemoglobin: 10.7 g/dL — ABNORMAL LOW (ref 12.0–15.0)
MCH: 26.4 pg (ref 26.0–34.0)
MCHC: 29.6 g/dL — ABNORMAL LOW (ref 30.0–36.0)
MCV: 89.2 fL (ref 80.0–100.0)
Platelets: 190 10*3/uL (ref 150–400)
RBC: 4.06 MIL/uL (ref 3.87–5.11)
RDW: 18.4 % — ABNORMAL HIGH (ref 11.5–15.5)
WBC: 8.3 10*3/uL (ref 4.0–10.5)
nRBC: 0 % (ref 0.0–0.2)

## 2022-12-28 LAB — TSH: TSH: 0.626 u[IU]/mL (ref 0.350–4.500)

## 2022-12-28 LAB — MAGNESIUM: Magnesium: 1.9 mg/dL (ref 1.7–2.4)

## 2022-12-28 LAB — HIV ANTIBODY (ROUTINE TESTING W REFLEX): HIV Screen 4th Generation wRfx: NONREACTIVE

## 2022-12-28 MED ORDER — ZINC SULFATE 220 (50 ZN) MG PO CAPS
220.0000 mg | ORAL_CAPSULE | Freq: Every day | ORAL | Status: DC
  Administered 2022-12-28 – 2022-12-31 (×4): 220 mg via ORAL
  Filled 2022-12-28 (×4): qty 1

## 2022-12-28 MED ORDER — POTASSIUM CHLORIDE CRYS ER 20 MEQ PO TBCR
40.0000 meq | EXTENDED_RELEASE_TABLET | Freq: Once | ORAL | Status: AC
  Administered 2022-12-28: 40 meq via ORAL
  Filled 2022-12-28: qty 2

## 2022-12-28 MED ORDER — PANTOPRAZOLE SODIUM 40 MG PO TBEC
40.0000 mg | DELAYED_RELEASE_TABLET | Freq: Every day | ORAL | Status: DC
Start: 2022-12-28 — End: 2022-12-28

## 2022-12-28 MED ORDER — HYDROXYZINE HCL 10 MG PO TABS
10.0000 mg | ORAL_TABLET | Freq: Once | ORAL | Status: AC | PRN
  Administered 2022-12-28: 10 mg via ORAL
  Filled 2022-12-28: qty 1

## 2022-12-28 MED ORDER — FUROSEMIDE 10 MG/ML IJ SOLN
40.0000 mg | Freq: Two times a day (BID) | INTRAMUSCULAR | Status: DC
  Administered 2022-12-28: 40 mg via INTRAVENOUS
  Filled 2022-12-28: qty 4

## 2022-12-28 MED ORDER — METOPROLOL SUCCINATE ER 25 MG PO TB24
25.0000 mg | ORAL_TABLET | Freq: Every day | ORAL | Status: DC
  Administered 2022-12-28 – 2022-12-31 (×4): 25 mg via ORAL
  Filled 2022-12-28 (×4): qty 1

## 2022-12-28 MED ORDER — ATORVASTATIN CALCIUM 40 MG PO TABS
80.0000 mg | ORAL_TABLET | Freq: Every day | ORAL | Status: DC
  Administered 2022-12-28 – 2022-12-31 (×4): 80 mg via ORAL
  Filled 2022-12-28 (×4): qty 2

## 2022-12-28 MED ORDER — HYDROXYZINE HCL 10 MG PO TABS
10.0000 mg | ORAL_TABLET | Freq: Every day | ORAL | Status: DC
  Administered 2022-12-28 – 2022-12-30 (×3): 10 mg via ORAL
  Filled 2022-12-28 (×3): qty 1

## 2022-12-28 MED ORDER — ESCITALOPRAM OXALATE 10 MG PO TABS
5.0000 mg | ORAL_TABLET | Freq: Every day | ORAL | Status: DC
  Administered 2022-12-28 – 2022-12-31 (×4): 5 mg via ORAL
  Filled 2022-12-28 (×4): qty 1

## 2022-12-28 MED ORDER — EMPAGLIFLOZIN 10 MG PO TABS
10.0000 mg | ORAL_TABLET | Freq: Every day | ORAL | Status: DC
  Administered 2022-12-28 – 2022-12-31 (×4): 10 mg via ORAL
  Filled 2022-12-28 (×4): qty 1

## 2022-12-28 MED ORDER — VITAMIN C 500 MG PO TABS
500.0000 mg | ORAL_TABLET | Freq: Every day | ORAL | Status: DC
  Administered 2022-12-28 – 2022-12-31 (×4): 500 mg via ORAL
  Filled 2022-12-28 (×4): qty 1

## 2022-12-28 MED ORDER — ORAL CARE MOUTH RINSE: 15.0000 mL | OROMUCOSAL | Status: DC | PRN

## 2022-12-28 NOTE — Consult Note (Signed)
Cardiology Consultation   Patient ID: Christine Jacobson MRN: [id number]; DOB: Jul 09, 1955  Admit date: 12/27/2022 Date of Consult: 12/28/2022  PCP:  Christine Cai, NP   [facility] Providers Cardiologist:  Christine Ripple, DO        Patient Profile:   Christine Jacobson is a 67 y.o. female with a hx of diastolic heart failure, HCM, pulmonary hypertension, atrial fibrillation, hypertension who is being seen 12/28/2022 for the evaluation of heart failure at the request of Christine Jacobson.  History of Present Illness:   Christine Jacobson  with a hx of diastolic heart failure, HCM, pulmonary hypertension, atrial fibrillation, hypertension who we were consulted for heart failure.  She reports worsening swelling in in right leg over last few days.  States that has chronic shortness of breath.  She presented to ED yesterday with these symptoms.  Initial vital signs notable for BP 131/76, pulse 78, SpO2 90% on room air.  Labs notable for creatinine 1.16, albumin 3.4, BNP 768, hemoglobin 11.1, platelets 225, WBC 7.2, troponin 17 > 15.  Chest x-ray showed no acute disease. EKG was not done.  She was started on IV Lasix.  She follows with Christine Jacobson, last seen 11/24/2022.  She was admitted for atrial fibrillation 09/2022, underwent TEE/DCCV with restoration of sinus rhythm.  Echo that admission showed findings concerning for HOCM with gradient 75 mmHg at rest.   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   2D ECHO - EF >55%, normal   HTN (hypertension)    Hyperprolactinemia (HCC)    dx in her 18, took meds, self d/c a while back   Liver function test abnormality    normal when repeated   Mild hyperlipidemia    Obesity    Palpitations    negative stress echo in November  2012 with normal LV function; mild LVH, proximal septal thickening with narrow LVOT and mild gradient; mild MR and TR; Cardionet showed PACs in November 2012   Pyoderma gangrenosa    Shortness of breath 07/11/2011   MET TEST    Past Surgical History:  Procedure Laterality Date   BIOPSY  08/10/2022   Procedure: BIOPSY;  Surgeon: Christine Modena, MD;  Location: [facility];  Service: Gastroenterology;;   CARDIOVERSION N/A 02/01/2022   Procedure: CARDIOVERSION;  Surgeon: Christine Ripple, DO;  Location: [facility] ENDOSCOPY;  Service: Cardiovascular;  Laterality: N/A;   CARDIOVERSION N/A 09/20/2022   Procedure: CARDIOVERSION;  Surgeon: Christine Patty, MD;  Location: [facility] INVASIVE CV LAB;  Service: Cardiovascular;  Laterality: N/A;   COLONOSCOPY WITH PROPOFOL Left 08/10/2022   Procedure: COLONOSCOPY WITH PROPOFOL;  Surgeon: Christine Modena, MD;  Location: [facility] ENDOSCOPY;  Service: Gastroenterology;  Laterality: Left;   ESOPHAGOGASTRODUODENOSCOPY (EGD) WITH PROPOFOL Left 08/10/2022   Procedure: ESOPHAGOGASTRODUODENOSCOPY (EGD) WITH PROPOFOL;  Surgeon: Christine Modena, MD;  Location: [facility] ENDOSCOPY;  Service: Gastroenterology;  Laterality: Left;   POLYPECTOMY  08/10/2022   Procedure: POLYPECTOMY;  Surgeon: Christine Modena, MD;  Location: [facility] ENDOSCOPY;  Service: Gastroenterology;;   RIGHT HEART CATH N/A 10/15/2021   Procedure: RIGHT HEART CATH;  Surgeon: Christine Negus, MD;  Location: [facility] INVASIVE CV LAB;  Service: Cardiovascular;  Laterality: N/A;   SKIN GRAFT  2006   porcine, R leg   TEE WITHOUT CARDIOVERSION N/A  09/20/2022   Procedure: TRANSESOPHAGEAL ECHOCARDIOGRAM;  Surgeon: Christine Patty, MD;  Location: [facility] INVASIVE CV LAB;  Service: Cardiovascular;  Laterality: N/A;    Inpatient Medications: Scheduled Meds:  apixaban  5 mg Oral BID   ascorbic acid  500 mg Oral Daily   atorvastatin  80 mg Oral Daily   empagliflozin  10 mg Oral Daily   escitalopram  5 mg Oral Daily   furosemide  40 mg Intravenous  BID   hydrOXYzine  10 mg Oral QHS   metoprolol succinate  25 mg Oral Daily   zinc sulfate (50mg  elemental zinc)  220 mg Oral Daily   Continuous Infusions:  PRN Meds: acetaminophen **OR** acetaminophen, ondansetron **OR** ondansetron (ZOFRAN) IV, mouth rinse, oxyCODONE, polyethylene glycol, senna-docusate  Allergies:    Allergies  Allergen Reactions   Pork-Derived Products Other (See Comments)    Religious preference    Social History:   Social History   Socioeconomic History   Marital status: Single    Spouse name: Not on file   Number of children: 0   Years of education: Not on file   Highest education level: Not on file  Occupational History   Occupation: para Psychologist, forensic, part time cook  Tobacco Use   Smoking status: Former    Current packs/day: 1.00    Average packs/day: 1 pack/day for 4.0 years (4.0 ttl pk-yrs)    Types: Cigarettes   Smokeless tobacco: Never  Vaping Use   Vaping status: Never Used  Substance and Sexual Activity   Alcohol use: Not Currently    Alcohol/week: 1.0 standard drink of alcohol    Types: 1 Glasses of wine per week    Comment: "I do have my wine" every day, has cut down from 2 bottles at a time, currently 2-3 glasses   Drug use: No   Sexual activity: Not Currently  Other Topics Concern   Not on file  Social History Narrative   Lives by self   Social Determinants of Health   Financial Resource Strain: Low Risk  (12/03/2021)   Overall Financial Resource Strain (CARDIA)    Difficulty of Paying Living Expenses: Not very hard  Food Insecurity: No Food Insecurity (12/27/2022)   Hunger Vital Sign    Worried About Running Out of Food in the Last Year: Never true    Ran Out of Food in the Last Year: Never true  Transportation Needs: No Transportation Needs (12/27/2022)   PRAPARE - Administrator, Civil Service (Medical): No    Lack of Transportation (Non-Medical): No  Physical Activity: Not on file  Stress: Not on file   Social Connections: Not on file  Intimate Partner Violence: Not At Risk (12/27/2022)   Humiliation, Afraid, Rape, and Kick questionnaire    Fear of Current or Ex-Partner: No    Emotionally Abused: No    Physically Abused: No    Sexually Abused: No    Family History:    Family History  Problem Relation Age of Onset   Heart disease Mother        endocarditis   Ovarian cancer Mother    Emphysema Father    Coronary artery disease Other        F in his 45   Diabetes Other        GP   Cancer Neg Hx      ROS:  Please see the history of present illness.   All other ROS reviewed and negative.  Physical Exam/Data:   Vitals:   12/28/22 0330 12/28/22 0530 12/28/22 0640 12/28/22 1130  BP: 114/75  (!) 153/88 128/70  Pulse: 87  90 85  Resp: 18  19   Temp: 98.1 F (36.7 C)  98 F (36.7 C) 97.8 F (36.6 C)  TempSrc: Oral  Oral Oral  SpO2: 98%  100% 99%  Weight:  82.7 kg    Height:        Intake/Output Summary (Last 24 hours) at 12/28/2022 1345 Last data filed at 12/28/2022 1254 Gross per 24 hour  Intake 360 ml  Output 2650 ml  Net -2290 ml      12/28/2022    5:30 AM 12/27/2022   10:53 PM 12/27/2022   11:00 AM  Last 3 Weights  Weight (lbs) 182 lb 5.1 oz 182 lb 8 oz 183 lb 14.7 oz  Weight (kg) 82.7 kg 82.781 kg 83.426 kg     Body mass index is 31.3 kg/m.  General:  in no acute distress HEENT: normal Neck: no JVD Cardiac: Irregular, normal rate, no murmur Lungs:  clear to auscultation bilaterally, no wheezing, rhonchi or rales  Abd: soft, nontender, no hepatomegaly  Ext: b/l unna boots Musculoskeletal:  No deformities Skin: warm and dry  Neuro:  CNs 2-12 intact, no focal abnormalities noted Psych:  Normal affect   EKG:  The EKG was personally reviewed and demonstrates:  No EKG, will order Telemetry:  Telemetry was personally reviewed and demonstrates:  Afib rates 80-90s  Relevant CV Studies:   Laboratory Data:  High Sensitivity Troponin:   Recent  Labs  Lab 12/27/22 1658 12/27/22 1908  TROPONINIHS 17 15     Chemistry Recent Labs  Lab 12/27/22 1118 12/28/22 0353  NA 136 139  K 4.3 3.5  CL 111 111  CO2 16* 18*  GLUCOSE 97 92  BUN 27* 23  CREATININE 1.16* 0.92  CALCIUM 9.2 8.9  MG  --  1.9  GFRNONAA 51* >60  ANIONGAP 9 10    Recent Labs  Lab 12/27/22 1118 12/28/22 0353  PROT 6.8 6.1*  ALBUMIN 3.4* 3.0*  AST 30 23  ALT 26 20  ALKPHOS 255* 197*  BILITOT 0.5 0.6   Lipids No results for input(s): "CHOL", "TRIG", "HDL", "LABVLDL", "LDLCALC", "CHOLHDL" in the last 168 hours.  Hematology Recent Labs  Lab 12/27/22 1118 12/28/22 0353  WBC 7.2 8.3  RBC 4.24 4.06  HGB 11.1* 10.7*  HCT 37.4 36.2  MCV 88.2 89.2  MCH 26.2 26.4  MCHC 29.7* 29.6*  RDW 18.3* 18.4*  PLT 225 190   Thyroid  Recent Labs  Lab 12/28/22 0353  TSH 0.626    BNP Recent Labs  Lab 12/27/22 1658  BNP 768.0*    DDimer No results for input(s): "DDIMER" in the last 168 hours.   Radiology/Studies:  DG Chest Portable 1 View  Result Date: 12/27/2022 CLINICAL DATA:  Short of breath, right leg edema EXAM: PORTABLE CHEST 1 VIEW COMPARISON:  09/29/2022 FINDINGS: Single frontal view of the chest demonstrates enlarged cardiac silhouette. No airspace disease, effusion, or pneumothorax. No acute bony abnormalities. IMPRESSION: 1. Enlarged cardiac silhouette.  No acute airspace disease. Electronically Signed   By: Sharlet Jacobson M.D.   On: 12/27/2022 17:47     Assessment and Plan:   Acute on chronic diastolic heart failure: Presented with volume overload, started on IV Lasix.  Would be cautious with overdiuresis in setting of HOCM.  Received IV Lasix this morning, will hold off on further  Lasix today.  Check echocardiogram to evaluate LVOT gradients.  Unna boots applied for LE edema.  HOCM: Echo 09/16/2022 showed findings concerning for HOCM with basal septum measuring 20 mm and LVOT gradient 75 mmHg at rest. -Check echo to evaluate LVOT  gradients  Persistent atrial fibrillation: Underwent TEE/DCCV 09/2022.  She is back in A-fib but rates appear well-controlled.  Continue Toprol-XL.  Continue Eliquis for anticoagulation  Mitral regurgitation: Moderate to severe on echo 09/16/2022.  Will repeat echo to monitor for improvement since now in sinus rhythm  Pulmonary hypertension: RHC in 2023 showed moderate pulmonary hypertension, likely group 2.  For questions or updates, please contact [facility] Please consult [email or web address] for contact info under    Signed, Little Ishikawa, MD  12/28/2022 1:45 PM

## 2022-12-28 NOTE — Progress Notes (Signed)
   12/28/22 1500  Spiritual Encounters  Type of Visit Initial  Care provided to: Patient  Conversation partners present during encounter Nurse  Referral source Patient request  Reason for visit Religious ritual  OnCall Visit No  Spiritual Framework  Presenting Themes Meaning/purpose/sources of inspiration;Goals in life/care;Values and beliefs;Rituals and practive;Impactful experiences and emotions;Courage hope and growth  Community/Connection Limited  Patient Stress Factors Health changes;Exhausted  Interventions  Spiritual Care Interventions Made Established relationship of care and support;Compassionate presence;Reflective listening;Prayer;Encouragement  Intervention Outcomes  Outcomes Connection to spiritual care;Awareness around self/spiritual resourses;Connection to values and goals of care;Reduced fear;Reduced anxiety  Spiritual Care Plan  Spiritual Care Issues Still Outstanding Referring to oncoming chaplain for further support   Chaplain received a spiritual consult for prayer for the patient. Chaplain went to visit the patient and she shared that she has been feeling really depressed and that her body is tired and in pain. Patient feels like God is ready for her to come home. Chaplain listened reflectively to the patient and offered her words of hope and comfort. Chaplain prayed with the patient and also played uplifting music for her. Chaplain services will continue to be of service for emotional and spiritual support.

## 2022-12-28 NOTE — Progress Notes (Addendum)
PROGRESS NOTE  Christine Jacobson WUJ:[id number] DOB: 07-Apr-1955   PCP: Karenann Cai, NP  Patient is from: Home.  Lives alone.  Uses rolling walker at baseline.  DOA: 12/27/2022 LOS: 1  Chief complaints Chief Complaint  Patient presents with   Wound Check   Leg Swelling     Brief Narrative / Interim history: 67 y.o. female with PMH of diastolic CHF, HOCM, PAH, moderate TR, A-fib on Eliquis, HTN and obesity presented to ED with progressive right leg swelling, and admitted with acute on chronic diastolic CHF and bilateral lower extremity edema.  In ED, vital stable.  Labs significant for bicarb of 16, Cr 1.16 (1.29 on 11/7), ALP 255, BNP 768, troponin 17, Hgb 11.1.  CXR with enlarged cardiac silhouette.  Started on IV Lasix.  The next day, edema improved.  Continued on IV Lasix.  Cardiology consulted  Subjective: Seen and examined earlier this morning.  No major events overnight of this morning.  Feels better.  Reports improvement in edema.  Reports making good amount of urine.  Denies chest pain, shortness of breath or GI symptoms.  Feels tired and sleepy from not sleeping last night.  Objective: Vitals:   12/28/22 0330 12/28/22 0530 12/28/22 0640 12/28/22 1130  BP: 114/75  (!) 153/88 128/70  Pulse: 87  90 85  Resp: 18  19   Temp: 98.1 F (36.7 C)  98 F (36.7 C) 97.8 F (36.6 C)  TempSrc: Oral  Oral Oral  SpO2: 98%  100% 99%  Weight:  82.7 kg    Height:        Examination:  GENERAL: No apparent distress.  Nontoxic. HEENT: MMM.  Vision and hearing grossly intact.  NECK: Supple.  No apparent JVD.  RESP:  No IWOB.  Fair aeration bilaterally. CVS:  IR. Heart sounds normal.  ABD/GI/GU: BS+. Abd soft, NTND.  MSK/EXT:  Moves extremities.  1-2 BLE edema with some weeping especially from RLE. SKIN: Some erythema and weeping from RLE. NEURO: Awake, alert and oriented appropriately.  No apparent focal neuro deficit. PSYCH: Calm. Normal affect.   Procedures:   None  Microbiology summarized: None  Assessment and plan: Bilateral lower extremity edema: Significant BLE edema with erythema and weeping in RLE. Acute on chronic diastolic CHF: No significant cardiopulmonary symptoms other than some dyspnea, DOE and BLE edema.  Lungs clear.  CXR without acute finding.  BNP elevated to 768 (higher than prior value).  TTE in 08/2022 with LVEF of 65 to 70%, HOCM, RVSP of 64.8, massively dilated LAE, moderate to severe MR and moderate TR. however, TEE on 9/3 with LVEF of 70 to 75%, severe asymmetric LVH, moderate TR, mild MR and normal RVSP.  TSH normal.  Reports compliance with torsemide.  Started on IV Lasix.  About 1.1 L UOP overnight.  Renal function improved.  BP stable. -Continue IV Lasix 40 mg twice daily -Resume home Jardiance and Toprol-XL. -Cardiology consulted given underlying HOCM -Strict intake and output, daily weight, renal functions and electrolyte -Wound care consulted.  Might benefit from Unna boot   Paroxysmal A-fib: rate controlled.  On Toprol-XL and Eliquis at home. -Continue home Eliquis and Toprol-XL. -Optimize electrolytes   Non-anion gap metabolic acidosis: Unclear etiology but improving with diuretics. -Continue diuretics as above  Elevated ALP: Congestive hepatopathy?  Improving.   Hyperlipidemia -Continue home Lipitor   CKD-3A: Stable -Continue monitoring   Obesity:  Body mass index is 31.3 kg/m.          DVT  prophylaxis:   apixaban (ELIQUIS) tablet 5 mg  Code Status: Full code Family Communication: None at bedside Level of care: Telemetry Status is: Inpatient Remains inpatient appropriate because: Acute diastolic CHF and edema   Final disposition: Likely home once medically stable Consultants:  Cardiology  55 minutes with more than 50% spent in reviewing records, counseling patient/family and coordinating care.   Sch Meds:  Scheduled Meds:  apixaban  5 mg Oral BID   furosemide  40 mg Intravenous BID    Continuous Infusions: PRN Meds:.acetaminophen **OR** acetaminophen, ondansetron **OR** ondansetron (ZOFRAN) IV, mouth rinse, oxyCODONE, polyethylene glycol, senna-docusate  Antimicrobials: Anti-infectives (From admission, onward)    None        I have personally reviewed the following labs and images: CBC: Recent Labs  Lab 12/27/22 1118 12/28/22 0353  WBC 7.2 8.3  HGB 11.1* 10.7*  HCT 37.4 36.2  MCV 88.2 89.2  PLT 225 190   BMP &GFR Recent Labs  Lab 12/27/22 1118 12/28/22 0353  NA 136 139  K 4.3 3.5  CL 111 111  CO2 16* 18*  GLUCOSE 97 92  BUN 27* 23  CREATININE 1.16* 0.92  CALCIUM 9.2 8.9  MG  --  1.9   Estimated Creatinine Clearance: 60 mL/min (by C-G formula based on SCr of 0.92 mg/dL). Liver & Pancreas: Recent Labs  Lab 12/27/22 1118 12/28/22 0353  AST 30 23  ALT 26 20  ALKPHOS 255* 197*  BILITOT 0.5 0.6  PROT 6.8 6.1*  ALBUMIN 3.4* 3.0*   No results for input(s): "LIPASE", "AMYLASE" in the last 168 hours. No results for input(s): "AMMONIA" in the last 168 hours. Diabetic: No results for input(s): "HGBA1C" in the last 72 hours. No results for input(s): "GLUCAP" in the last 168 hours. Cardiac Enzymes: No results for input(s): "CKTOTAL", "CKMB", "CKMBINDEX", "TROPONINI" in the last 168 hours. No results for input(s): "PROBNP" in the last 8760 hours. Coagulation Profile: No results for input(s): "INR", "PROTIME" in the last 168 hours. Thyroid Function Tests: Recent Labs    12/28/22 0353  TSH 0.626   Lipid Profile: No results for input(s): "CHOL", "HDL", "LDLCALC", "TRIG", "CHOLHDL", "LDLDIRECT" in the last 72 hours. Anemia Panel: No results for input(s): "VITAMINB12", "FOLATE", "FERRITIN", "TIBC", "IRON", "RETICCTPCT" in the last 72 hours. Urine analysis:    Component Value Date/Time   COLORURINE YELLOW 06/03/2022 0400   APPEARANCEUR HAZY (A) 06/03/2022 0400   LABSPEC 1.019 06/03/2022 0400   PHURINE 5.0 06/03/2022 0400   GLUCOSEU  NEGATIVE 06/03/2022 0400   HGBUR SMALL (A) 06/03/2022 0400   BILIRUBINUR NEGATIVE 06/03/2022 0400   KETONESUR NEGATIVE 06/03/2022 0400   PROTEINUR 30 (A) 06/03/2022 0400   NITRITE NEGATIVE 06/03/2022 0400   LEUKOCYTESUR MODERATE (A) 06/03/2022 0400   Sepsis Labs: Invalid input(s): "PROCALCITONIN", "LACTICIDVEN"  Microbiology: No results found for this or any previous visit (from the past 240 hour(s)).  Radiology Studies: DG Chest Portable 1 View  Result Date: 12/27/2022 CLINICAL DATA:  Short of breath, right leg edema EXAM: PORTABLE CHEST 1 VIEW COMPARISON:  09/29/2022 FINDINGS: Single frontal view of the chest demonstrates enlarged cardiac silhouette. No airspace disease, effusion, or pneumothorax. No acute bony abnormalities. IMPRESSION: 1. Enlarged cardiac silhouette.  No acute airspace disease. Electronically Signed   By: Sharlet Salina M.D.   On: 12/27/2022 17:47      Woodie Degraffenreid T. Aydan Levitz [facility]  If 7PM-7AM, please contact night-coverage [email or web address] 12/28/2022, 12:49 PM

## 2022-12-28 NOTE — Progress Notes (Signed)
PT Cancellation Note  Patient Details Name: Christine Jacobson MRN: [id number] DOB: 02/17/1955   Cancelled Treatment:    Reason Eval/Treat Not Completed: Fatigue/lethargy limiting ability to participate (Pt stated she's too tired at present and requested PT attempt later today. Will follow.)   Tamala Ser PT 12/28/2022  Acute Rehabilitation Services  Office [phone]

## 2022-12-28 NOTE — Evaluation (Signed)
Physical Therapy Evaluation Patient Details Name: Christine Jacobson MRN: [id number] DOB: 08-Feb-1955 Today's Date: 12/28/2022  History of Present Illness  67 y.o. female presented to ED with progressive right leg swelling. Dx of acute on chronic CHF. Pt with PMH of diastolic CHF, HOCM, PAH, moderate TR, A-fib on Eliquis, HTN. Recent admission 8/29-09/21/22 for CHF exacerbation.  Clinical Impression  Pt admitted with above diagnosis. Pt performed supine to sit with HOB up without physical assistance. Pt performed sit to stand with RW x 2 but did not feel comfortable attempting ambulation 2* BLE pain and frequent urination 2* diuresis, ambulation was deferred. Pt performed seated BLE exerciese at edge of bed.  Pt currently with functional limitations due to the deficits listed below (see PT Problem List). Pt will benefit from acute skilled PT to increase their independence and safety with mobility to allow discharge.           If plan is discharge home, recommend the following: A little help with walking and/or transfers;A little help with bathing/dressing/bathroom;Assistance with cooking/housework;Assist for transportation;Help with stairs or ramp for entrance   Can travel by private vehicle        Equipment Recommendations None recommended by PT  Recommendations for Other Services       Functional Status Assessment Patient has had a recent decline in their functional status and demonstrates the ability to make significant improvements in function in a reasonable and predictable amount of time.     Precautions / Restrictions Precautions Precautions: Fall Restrictions Weight Bearing Restrictions: No      Mobility  Bed Mobility Overal bed mobility: Needs Assistance Bed Mobility: Supine to Sit, Sit to Supine     Supine to sit: Supervision, HOB elevated, Used rails          Transfers Overall transfer level: Needs assistance Equipment used: Rolling walker (2  wheels) Transfers: Sit to/from Stand, Bed to chair/wheelchair/BSC Sit to Stand: Contact guard assist           General transfer comment: sit to stand x 2 with RW at edge of bed, sit to stand was labored. Pt stood very briefly then returned to sitting stating, "I just don't want to do this." Pt reports pain in BLEs, and frequent urination 2* diuresis so doesn't want to be disconnected from purewick external catheter to walk.    Ambulation/Gait                  Stairs            Wheelchair Mobility     Tilt Bed    Modified Rankin (Stroke Patients Only)       Balance Overall balance assessment: Needs assistance Sitting-balance support: Feet supported Sitting balance-Leahy Scale: Good     Standing balance support: Bilateral upper extremity supported, Reliant on assistive device for balance, During functional activity Standing balance-Leahy Scale: Poor                               Pertinent Vitals/Pain Pain Assessment Faces Pain Scale: Hurts little more Pain Location: B LEs Pain Descriptors / Indicators: Aching, Grimacing Pain Intervention(s): Limited activity within patient's tolerance, Monitored during session, Premedicated before session, Repositioned    Home Living Family/patient expects to be discharged to:: Private residence Living Arrangements: Alone Available Help at Discharge: Friend(s);Available PRN/intermittently Type of Home: Apartment Home Access: Ramped entrance       Home Layout: One level Home Equipment:  Rolling Walker (2 wheels)      Prior Function Prior Level of Function : Independent/Modified Independent             Mobility Comments: walks with RW; doesn't drive, friends take her shopping, uses a shuttle bus to MD appointments ADLs Comments: grocery shops through [employer], bird bathes at baseline, independent with ADLs and iADLs, kitchen and laundry are communal     Extremity/Trunk Assessment   Upper  Extremity Assessment Upper Extremity Assessment: Defer to OT evaluation    Lower Extremity Assessment Lower Extremity Assessment: RLE deficits/detail;LLE deficits/detail RLE Deficits / Details: reports sensation is intact to light touch B feet, knee ext at least 3/5 bilaterally RLE: Unable to fully assess due to pain LLE Deficits / Details: reports sensation is intact to light touch B feet, knee ext at least 3/5 bilaterally LLE: Unable to fully assess due to pain    Cervical / Trunk Assessment Cervical / Trunk Assessment: Normal  Communication   Communication Communication: No apparent difficulties  Cognition Arousal: Alert Behavior During Therapy: Agitated Overall Cognitive Status: Within Functional Limits for tasks assessed                                 General Comments: Pt very anxious and very resistant to getting OOB mostly due to purewick and being on lasix at present. Explained pt can have purewick removed for mobility then replaced, pt declined ambulation.        General Comments General comments (skin integrity, edema, etc.): Pt most limited by B leg pain and currently urinating a lot bc of lasix so limited to being close to purewick or bathroom. Feel pt will progress with balance and mobility once fluid has been pulled off via meds.    Exercises  Marching in sitting x 15 both AROM Long arc quads x 10 both AROM seated   Assessment/Plan    PT Assessment Patient needs continued PT services  PT Problem List Decreased activity tolerance;Decreased mobility;Pain       PT Treatment Interventions Gait training;Functional mobility training;Therapeutic activities;Patient/family education;Therapeutic exercise    PT Goals (Current goals can be found in the Care Plan section)  Acute Rehab PT Goals Patient Stated Goal: none stated PT Goal Formulation: With patient Time For Goal Achievement: 01/11/23 Potential to Achieve Goals: Good    Frequency Min  1X/week     Co-evaluation               AM-PAC PT "6 Clicks" Mobility  Outcome Measure Help needed turning from your back to your side while in a flat bed without using bedrails?: A Little Help needed moving from lying on your back to sitting on the side of a flat bed without using bedrails?: A Little Help needed moving to and from a bed to a chair (including a wheelchair)?: A Little Help needed standing up from a chair using your arms (e.g., wheelchair or bedside chair)?: A Little Help needed to walk in hospital room?: A Lot Help needed climbing 3-5 steps with a railing? : Total 6 Click Score: 15    End of Session   Activity Tolerance: Patient limited by fatigue;Patient limited by pain Patient left: in bed;with call bell/phone within reach;with bed alarm set Nurse Communication: Mobility status PT Visit Diagnosis: Difficulty in walking, not elsewhere classified (R26.2);Unsteadiness on feet (R26.81);Pain Pain - Right/Left:  (both) Pain - part of body: Leg    Time:  9604-5409 PT Time Calculation (min) (ACUTE ONLY): 13 min   Charges:   PT Evaluation $PT Eval Moderate Complexity: 1 Mod   PT General Charges $$ ACUTE PT VISIT: 1 Visit        Tamala Ser PT 12/28/2022  Acute Rehabilitation Services  Office [phone]

## 2022-12-28 NOTE — Progress Notes (Signed)
Orthopedic Tech Progress Note Patient Details:  Christine Jacobson 11-10-1955 [id number]  Ortho Devices Type of Ortho Device: Radio broadcast assistant Ortho Device/Splint Location: bi lat unna boot applied Ortho Device/Splint Interventions: Ordered, Application, Adjustment   Post Interventions Patient Tolerated: Well Instructions Provided: Adjustment of device, Care of device  [name] 12/28/2022, 12:00 PM

## 2022-12-28 NOTE — Consult Note (Signed)
WOC Nurse Consult Note: Reason for Consult: LE wounds Patient with extensive history of LE venous ulcerations. She has been followed by [facility] in the past. Last seen 11/21. She has not been seen in a [facility] that I can tell. She has been utilizing Unna's boots for edema management and control of venous ulcerations.  Wound type: venous bilateral LEs Pressure Injury POA: NA Measurement:circumferential areas of weeping bilaterally  Wound bed: weeping, red Drainage (amount, consistency, odor) serous on dressings in images  Periwound: edema bilaterally  Dressing procedure/placement/frequency:  Apply hydroconductive Drawtex [employer] [place] # [id number]) -order 5-6 pieces for each dressing change. Apply to weeping areas of the bilateral LEs. Cover with foam if possible or ok to have Unna's boots applied directly over Drawtex Contact Orthopedic tech to re-apply Unna's boot over dressing. Change 2x wk   Consider referral to outpatient wound care center of patient's choice for long term management of venous stasis disease and need for compression socks/garments after wounds are healed.    Re consult if needed, will not follow at this time. Thanks  Amyia Lodwick [employer], RN,CWOCN, CNS, CWON-AP [phone])

## 2022-12-28 NOTE — TOC Initial Note (Addendum)
Transition of Care [facility]) - Initial/Assessment Note    Patient Details  Name: Christine Jacobson MRN: [id number] Date of Birth: 03/30/1955  [facility]) CM/SW Contact:    Howell Rucks, RN Phone Number: 12/28/2022, 11:30 AM  Clinical Narrative:  Met with pt at bedside to introduce role of TOC/NCM and review for dc planning. Pt  confirmed she has an established PCP and pharmacy, reports she is currently receiving HH RN services through [facility] for wound care, home Dme: walker. Pt confirmed transportation is available at discharge.  PT eval pending, await recommendation.    -11:32am Text sent to [facility] rep-Cory, request confirmation of current [facility] services.    -2:29pm PT eval completed, recommendation for [facility] PT/OT.  Met with pt at bedside, states she will think about it. TOC will continue to follow.              Expected Discharge Plan: Home w Home Health Services Barriers to Discharge: Continued Medical Work up   Patient Goals and CMS Choice Patient states their goals for this hospitalization and ongoing recovery are:: reeturn home with resumption of HH RN through [facility]          Expected Discharge Plan and Services       Living arrangements for the past 2 months: Single Family Home                                      Prior Living Arrangements/Services Living arrangements for the past 2 months: Single Family Home Lives with:: Self Patient language and need for interpreter reviewed:: Yes Do you feel safe going back to the place where you live?: Yes      Need for Family Participation in Patient Care: Yes (Comment)   Current home services: DME (walker) Criminal Activity/Legal Involvement Pertinent to Current Situation/Hospitalization: No - Comment as needed  Activities of Daily Living   ADL Screening (condition at time of admission) Independently performs ADLs?: Yes (appropriate for developmental age) Is the patient deaf or have difficulty  hearing?: No Does the patient have difficulty seeing, even when wearing glasses/contacts?: No Does the patient have difficulty concentrating, remembering, or making decisions?: No  Permission Sought/Granted                  Emotional Assessment Appearance:: Appears stated age Attitude/Demeanor/Rapport: Gracious Affect (typically observed): Accepting Orientation: : Oriented to Self, Oriented to Place, Oriented to  Time, Oriented to Situation Alcohol / Substance Use: Not Applicable Psych Involvement: No (comment)  Admission diagnosis:  Leg swelling [M79.89] Acute on chronic diastolic (congestive) heart failure (HCC) [I50.33] Patient Active Problem List   Diagnosis Date Noted   Acute on chronic diastolic (congestive) heart failure (HCC) 12/27/2022   Major depressive disorder, 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   MDD (major depressive disorder) 09/29/2022   Anxiety 09/16/2022   Acute exacerbation of CHF (congestive heart failure) (HCC) 09/15/2022   Atrial fibrillation by electrocardiogram (HCC) 09/15/2022   Leg swelling 09/15/2022   GIB (gastrointestinal bleeding) 08/05/2022   Acquired thrombophilia (HCC) 08/05/2022   Chronic anticoagulation 08/05/2022   Acute blood loss anemia 08/04/2022   Cellulitis of both lower extremities 07/13/2022   Hypocarbia 07/13/2022   Normocytic anemia 07/13/2022   Cellulitis 06/02/2022   Sepsis due to cellulitis (HCC) 06/02/2022   AKI (acute kidney injury) (HCC) 06/02/2022   Hyponatremia 06/02/2022   Dehydration 06/02/2022  Metabolic acidosis 06/02/2022   Other secondary pulmonary hypertension (HCC)    Cellulitis and abscess of left lower extremity 10/13/2021   PAF (paroxysmal atrial fibrillation) (HCC) 07/28/2021   Obesity (BMI 30-39.9)    Liver function test abnormality    Primary hypertension    Chronic diastolic heart failure (HCC) 12/15/2016   HOCM (hypertrophic obstructive cardiomyopathy) (HCC) 01/22/2016    Lower extremity edema 09/17/2014   History of syncope 12/05/2013   Syncope 12/05/2013   Pneumonia 11/23/2012   Shortness of breath 07/11/2011   Hypertension 11/23/2010   Chest pain 10/18/2010   Murmur 10/18/2010   Palpitations 09/23/2010   Mild hyperlipidemia    Elevated BP    Hyperprolactinemia (HCC)    Pyoderma gangrenosa    PCP:  Karenann Cai, NP Pharmacy:   [facility] [address] [place] [address] Phone: [phone] Fax: [phone]  [facility] [address] [place] [address] Phone: [phone] Fax: [phone]  [place] [address] [place] [address] Phone: [phone] Fax: [phone]     Social Determinants of Health (SDOH) Social History: SDOH Screenings   Food Insecurity: No Food Insecurity (12/27/2022)  Housing: Medium Risk (12/27/2022)  Transportation Needs: No Transportation Needs (12/27/2022)  Utilities: Not At Risk (12/27/2022)  Depression (PHQ2-9): Low Risk  (10/26/2021)  Financial Resource Strain: Low Risk  (12/03/2021)  Tobacco Use: Medium Risk (12/27/2022)   SDOH Interventions:     Readmission Risk Interventions    12/28/2022   11:28 AM 08/07/2022   11:10 AM 07/14/2022    2:40 PM  Readmission Risk Prevention Plan  Post Dischage Appt   Complete  Medication Screening   Complete  Transportation Screening Complete Complete   PCP or Specialist Appt within 5-7 Days  Complete   PCP or Specialist Appt within 3-5 Days Complete    Home Care Screening  Complete   Medication Review (RN CM)  Complete   HRI or Home Care Consult Complete    Social Work Consult for Recovery Care Planning/Counseling Complete    Palliative Care Screening Not Applicable    Medication Review Oceanographer) Complete

## 2022-12-28 NOTE — Evaluation (Signed)
Occupational Therapy Evaluation Patient Details Name: Christine Jacobson MRN: [id number] DOB: 10-12-1955 Today's Date: 12/28/2022   History of Present Illness 67 y.o. female presented to ED with progressive right leg swelling. Dx of acute on chronic CHF. Pt with PMH of diastolic CHF, HOCM, PAH, moderate TR, A-fib on Eliquis, HTN. Recent admission 8/29-09/21/22 for CHF exacerbation.   Clinical Impression   Pt admitted with the above diagnosis and has the deficits outlined below. Pt would benefit from cont OT to increase independence with basic adls and adl mobility back to baseline level of functioning. At baseline pt completes all adls with mod I, sponge bathes and does not drive. Pt has groceries delivered and a friend that can take her to MD appts if she cannot get public transportation.  Pt lives alone and does not have a lot of support but feels she can continue to manage at home. Pt has home care nursing 2x week for leg wounds and has not taken any falls at home. Will continue to see with focus on adls on her feet and balance during adls.        If plan is discharge home, recommend the following: A little help with walking and/or transfers;A little help with bathing/dressing/bathroom;Assistance with cooking/housework    Functional Status Assessment  Patient has had a recent decline in their functional status and demonstrates the ability to make significant improvements in function in a reasonable and predictable amount of time.  Equipment Recommendations  BSC/3in1    Recommendations for Other Services       Precautions / Restrictions Precautions Precautions: Fall Restrictions Weight Bearing Restrictions: No      Mobility Bed Mobility Overal bed mobility: Needs Assistance Bed Mobility: Supine to Sit, Sit to Supine     Supine to sit: Min assist Sit to supine: Min assist   General bed mobility comments: assist to get legs off edge of bed and back into bed     Transfers Overall transfer level: Needs assistance Equipment used: Rolling walker (2 wheels) Transfers: Sit to/from Stand, Bed to chair/wheelchair/BSC Sit to Stand: Min assist     Step pivot transfers: Min assist     General transfer comment: Pt unsteady on feet during transfers shuffling feet. Encouraged pt to lift feet during transfers.      Balance Overall balance assessment: Needs assistance Sitting-balance support: Feet supported Sitting balance-Leahy Scale: Good     Standing balance support: Bilateral upper extremity supported, Reliant on assistive device for balance, During functional activity Standing balance-Leahy Scale: Poor Standing balance comment: must have walker or outside support to stand.                           ADL either performed or assessed with clinical judgement   ADL Overall ADL's : Needs assistance/impaired Eating/Feeding: Independent;Sitting   Grooming: Wash/dry hands;Wash/dry face;Oral care;Independent;Sitting Grooming Details (indicate cue type and reason): needs to work to standing at sink Upper Body Bathing: Set up;Sitting   Lower Body Bathing: Minimal assistance;Sit to/from stand;Cueing for compensatory techniques Lower Body Bathing Details (indicate cue type and reason): assist to stand. painful LEs Upper Body Dressing : Set up;Sitting   Lower Body Dressing: Moderate assistance;Sit to/from stand;Cueing for compensatory techniques Lower Body Dressing Details (indicate cue type and reason): Pt can cross legs and uses figure 4 to donn socks adn shoes.  Pt with pain in legs today not allowing that mobility. Pt resistant to stand for long due to  urination. Toilet Transfer: Minimal assistance;Stand-pivot;BSC/3in1;Rolling walker (2 wheels) Toilet Transfer Details (indicate cue type and reason): declined walking due to pain and urinating so much. Toileting- Clothing Manipulation and Hygiene: Minimal assistance;Sit to/from stand;Cueing  for compensatory techniques     Tub/Shower Transfer Details (indicate cue type and reason): sponge bathes only Functional mobility during ADLs: Minimal assistance;Rolling walker (2 wheels) General ADL Comments: Pt most limited by pain and increased urnination bc of lasix. Should be able to mobilize more when urinating less.     Vision Baseline Vision/History: 1 Wears glasses Ability to See in Adequate Light: 0 Adequate Patient Visual Report: No change from baseline Vision Assessment?: No apparent visual deficits     Perception Perception: Within Functional Limits       Praxis Praxis: WFL       Pertinent Vitals/Pain Pain Assessment Pain Assessment: Faces Faces Pain Scale: Hurts little more Pain Location: B LEs Pain Descriptors / Indicators: Grimacing, Aching, Burning, Throbbing Pain Intervention(s): Limited activity within patient's tolerance, Monitored during session, Repositioned     Extremity/Trunk Assessment Upper Extremity Assessment Upper Extremity Assessment: Overall WFL for tasks assessed   Lower Extremity Assessment Lower Extremity Assessment: Defer to PT evaluation   Cervical / Trunk Assessment Cervical / Trunk Assessment: Normal   Communication Communication Communication: No apparent difficulties   Cognition Arousal: Alert Behavior During Therapy: Agitated Overall Cognitive Status: Within Functional Limits for tasks assessed                                 General Comments: Pt very anxious and very resistant to getting OOB mostly due to purewick and being on lasix at present. Explained pt can have purewick in chair but resistant to getting up.     General Comments  Pt most limited by B leg pain and currently urinating a lot bc of lasix so limited to being close to purewick or bathroom. Feel pt will progress with balance and mobility once fluid has been pulled off via meds.    Exercises     Shoulder Instructions      Home Living  Family/patient expects to be discharged to:: Private residence Living Arrangements: Alone   Type of Home: Apartment Home Access: Ramped entrance     Home Layout: One level     Bathroom Shower/Tub: Sponge bathes at baseline   Bathroom Toilet: Standard     Home Equipment: Agricultural consultant (2 wheels)          Prior Functioning/Environment Prior Level of Function : Independent/Modified Independent             Mobility Comments: walks with RW; doesn't drive, friends take her shopping, uses a shuttle bus to MD appointments ADLs Comments: grocery shops through [place], bird bathes at baseline, independent with ADLs and iADLs, kitchen and laundry are communal        OT Problem List: Decreased activity tolerance;Impaired balance (sitting and/or standing);Decreased knowledge of use of DME or AE;Obesity;Pain;Increased edema      OT Treatment/Interventions: Self-care/ADL training;Energy conservation;Therapeutic activities;Balance training    OT Goals(Current goals can be found in the care plan section) Acute Rehab OT Goals Patient Stated Goal: to be able to get around better OT Goal Formulation: With patient Time For Goal Achievement: 01/11/23 Potential to Achieve Goals: Good ADL Goals Pt Will Perform Grooming: with modified independence;standing Pt Will Perform Lower Body Bathing: with modified independence;sit to/from stand Pt Will Perform Lower Body Dressing:  with modified independence;sit to/from stand Additional ADL Goal #1: Pt will walk to bathroom with walker and complete all toileting with mod I Additional ADL Goal #2: Pt will state 2 things she can do differently during adls at home to save energy to be more efficient during the day without cues.  OT Frequency: Min 1X/week    Co-evaluation              AM-PAC OT "6 Clicks" Daily Activity     Outcome Measure Help from another person eating meals?: None Help from another person taking care of personal  grooming?: None Help from another person toileting, which includes using toliet, bedpan, or urinal?: A Little Help from another person bathing (including washing, rinsing, drying)?: A Little Help from another person to put on and taking off regular upper body clothing?: A Little Help from another person to put on and taking off regular lower body clothing?: A Little 6 Click Score: 20   End of Session Equipment Utilized During Treatment: Rolling walker (2 wheels) Nurse Communication: Mobility status  Activity Tolerance: Patient limited by pain Patient left: in bed;with call bell/phone within reach;with nursing/sitter in room;Other (comment) (wound care and nuring in room changing BLE dressings.)  OT Visit Diagnosis: Unsteadiness on feet (R26.81);Pain Pain - Right/Left: Left (both) Pain - part of body: Leg                Time: 1122-1150 OT Time Calculation (min): 28 min Charges:  OT General Charges $OT Visit: 1 Visit OT Evaluation $OT Eval Moderate Complexity: 1 Mod OT Treatments $Self Care/Home Management : 8-22 mins  Hope Budds 12/28/2022, 12:03 PM

## 2022-12-29 ENCOUNTER — Inpatient Hospital Stay (HOSPITAL_COMMUNITY): Payer: Medicare PPO

## 2022-12-29 DIAGNOSIS — I421 Obstructive hypertrophic cardiomyopathy: Secondary | ICD-10-CM | POA: Diagnosis not present

## 2022-12-29 DIAGNOSIS — I5033 Acute on chronic diastolic (congestive) heart failure: Secondary | ICD-10-CM | POA: Diagnosis not present

## 2022-12-29 DIAGNOSIS — I4819 Other persistent atrial fibrillation: Secondary | ICD-10-CM | POA: Diagnosis not present

## 2022-12-29 LAB — BASIC METABOLIC PANEL
Anion gap: 11 (ref 5–15)
BUN: 18 mg/dL (ref 8–23)
CO2: 21 mmol/L — ABNORMAL LOW (ref 22–32)
Calcium: 9 mg/dL (ref 8.9–10.3)
Chloride: 107 mmol/L (ref 98–111)
Creatinine, Ser: 0.95 mg/dL (ref 0.44–1.00)
GFR, Estimated: >60 mL/min (ref >60)
Glucose, Bld: 100 mg/dL — ABNORMAL HIGH (ref 70–99)
Potassium: 3.4 mmol/L — ABNORMAL LOW (ref 3.5–5.1)
Sodium: 139 mmol/L (ref 135–145)

## 2022-12-29 LAB — MAGNESIUM: Magnesium: 1.9 mg/dL (ref 1.7–2.4)

## 2022-12-29 LAB — ECHOCARDIOGRAM LIMITED
Height: 64 in
S' Lateral: 2.1 cm
Weight: 2927.71 [oz_av]

## 2022-12-29 MED ORDER — PERFLUTREN LIPID MICROSPHERE
1.0000 mL | INTRAVENOUS | Status: AC | PRN
  Administered 2022-12-29: 3 mL via INTRAVENOUS

## 2022-12-29 MED ORDER — POTASSIUM CHLORIDE CRYS ER 20 MEQ PO TBCR
60.0000 meq | EXTENDED_RELEASE_TABLET | Freq: Once | ORAL | Status: AC
  Administered 2022-12-29: 60 meq via ORAL
  Filled 2022-12-29: qty 3

## 2022-12-29 NOTE — Progress Notes (Signed)
Occupational Therapy Treatment Patient Details Name: Christine Jacobson MRN: [id number] DOB: [birth year] Today's Date: 12/29/2022   History of present illness 67 y.o. female presented to ED with progressive right leg swelling. Dx of acute on chronic CHF. Pt with PMH of diastolic CHF, HOCM, PAH, moderate TR, A-fib on Eliquis, HTN. Recent admission 8/29-09/21/22 for CHF exacerbation.   OT comments  Pt continues to need encouragement to participate in OOB activity. Agreed to work on sit to stand at [employer], but declined ambulation or ADL training. "I haven't walked yet, but I will." Pt states she is aware of energy conservation strategies, has all needed DME at home and has learned compensatory strategies for LB ADLs, not interested in AE. Pt is considering HHOT.       If plan is discharge home, recommend the following:  A little help with walking and/or transfers;A little help with bathing/dressing/bathroom;Assistance with cooking/housework   Equipment Recommendations  None recommended by OT    Recommendations for Other Services      Precautions / Restrictions Precautions Precautions: Fall Restrictions Weight Bearing Restrictions Per Provider Order: No       Mobility Bed Mobility Overal bed mobility: Needs Assistance Bed Mobility: Supine to Sit, Sit to Supine     Supine to sit: Supervision, HOB elevated, Used rails Sit to supine: Min assist   General bed mobility comments: assisted for LEs back into bed    Transfers Overall transfer level: Needs assistance Equipment used: Rolling walker (2 wheels) Transfers: Sit to/from Stand Sit to Stand: Contact guard assist           General transfer comment: increased bed height at pt's request, used momentum, performed x 2 and then pt discontinued activity     Balance Overall balance assessment: Needs assistance   Sitting balance-Leahy Scale: Good     Standing balance support: Bilateral upper extremity supported, Reliant on  assistive device for balance, During functional activity Standing balance-Leahy Scale: Poor Standing balance comment: reliant on walker                           ADL either performed or assessed with clinical judgement   ADL Overall ADL's : Needs assistance/impaired                     Lower Body Dressing: Maximal assistance;Bed level Lower Body Dressing Details (indicate cue type and reason): assisted with socks, pt plans to wear slip on shoes                    Extremity/Trunk Assessment              Vision       Perception     Praxis      Cognition Arousal: Alert Behavior During Therapy: Agitated Overall Cognitive Status: Within Functional Limits for tasks assessed                                 General Comments: pt requiring encouragement to participate, Lasix has been stopped today, stated she needed to wait for unna boots to be changed        Exercises      Shoulder Instructions       General Comments      Pertinent Vitals/ Pain       Pain Assessment Pain Assessment: Faces Faces Pain Scale: Hurts little more  Pain Location: B LEs Pain Descriptors / Indicators: Discomfort, Grimacing Pain Intervention(s): Monitored during session, Repositioned  Home Living                                          Prior Functioning/Environment              Frequency  Min 1X/week        Progress Toward Goals  OT Goals(current goals can now be found in the care plan section)  Progress towards OT goals: Not progressing toward goals - comment (self limiting)  Acute Rehab OT Goals OT Goal Formulation: With patient Time For Goal Achievement: 01/11/23 Potential to Achieve Goals: Good  Plan      Co-evaluation                 AM-PAC OT "6 Clicks" Daily Activity     Outcome Measure   Help from another person eating meals?: None Help from another person taking care of personal grooming?:  None Help from another person toileting, which includes using toliet, bedpan, or urinal?: A Little Help from another person bathing (including washing, rinsing, drying)?: A Little Help from another person to put on and taking off regular upper body clothing?: A Little Help from another person to put on and taking off regular lower body clothing?: A Little 6 Click Score: 20    End of Session Equipment Utilized During Treatment: Gait belt;Rolling walker (2 wheels)  OT Visit Diagnosis: Unsteadiness on feet (R26.81);Pain;Muscle weakness (generalized) (M62.81)   Activity Tolerance Other (comment) (self limiting)   Patient Left in bed;with call bell/phone within reach;with bed alarm set   Nurse Communication          Time: 2841-3244 OT Time Calculation (min): 12 min  Charges: OT General Charges $OT Visit: 1 Visit OT Treatments $Therapeutic Activity: 8-22 mins  Christine Jacobson, OTR/L Acute Rehabilitation Services Office: [phone]   Christine Jacobson 12/29/2022, 2:30 PM

## 2022-12-29 NOTE — Progress Notes (Signed)
Rounding Note    Patient Name: Christine Jacobson Date of Encounter: 12/29/2022  [facility] Cardiologist: Thomasene Ripple, DO   Subjective   No -2.2 L yesterday, -3.3 L on admission.  Creatinine stable at 0.95.  BP 107/73.  Denies any chest pain or dyspnea  Inpatient Medications    Scheduled Meds:  apixaban  5 mg Oral BID   ascorbic acid  500 mg Oral Daily   atorvastatin  80 mg Oral Daily   empagliflozin  10 mg Oral Daily   escitalopram  5 mg Oral Daily   hydrOXYzine  10 mg Oral QHS   metoprolol succinate  25 mg Oral Daily   zinc sulfate (50mg  elemental zinc)  220 mg Oral Daily   Continuous Infusions:  PRN Meds: acetaminophen **OR** acetaminophen, ondansetron **OR** ondansetron (ZOFRAN) IV, mouth rinse, oxyCODONE, polyethylene glycol, senna-docusate   Vital Signs    Vitals:   12/28/22 1956 12/29/22 0432 12/29/22 0500 12/29/22 1122  BP: 104/74 115/82  107/73  Pulse: 98 86  85  Resp: 20 19    Temp: 98.2 F (36.8 C) 98.3 F (36.8 C)  98 F (36.7 C)  TempSrc: Oral Oral  Oral  SpO2: 98% 94%    Weight:   83 kg   Height:        Intake/Output Summary (Last 24 hours) at 12/29/2022 1204 Last data filed at 12/29/2022 1139 Gross per 24 hour  Intake 1070 ml  Output 1600 ml  Net -530 ml      12/29/2022    5:00 AM 12/28/2022    5:30 AM 12/27/2022   10:53 PM  Last 3 Weights  Weight (lbs) 182 lb 15.7 oz 182 lb 5.1 oz 182 lb 8 oz  Weight (kg) 83 kg 82.7 kg 82.781 kg      Telemetry    Atrial fibrillation with rate 70s to 80s- Personally Reviewed  ECG    Atrial fibrillation, rate 91, LVH - Personally Reviewed  Physical Exam   GEN: No acute distress.   Neck: No JVD Cardiac: Irregular, normal rate, no murmurs, rubs, or gallops.  Respiratory: Clear to auscultation bilaterally. GI: Soft, nontender, non-distended  MS: Unna boots Neuro:  Nonfocal  Psych: Normal affect   Labs    High Sensitivity Troponin:   Recent Labs  Lab 12/27/22 1658  12/27/22 1908  TROPONINIHS 17 15     Chemistry Recent Labs  Lab 12/27/22 1118 12/28/22 0353 12/29/22 0402  NA 136 139 139  K 4.3 3.5 3.4*  CL 111 111 107  CO2 16* 18* 21*  GLUCOSE 97 92 100*  BUN 27* 23 18  CREATININE 1.16* 0.92 0.95  CALCIUM 9.2 8.9 9.0  MG  --  1.9 1.9  PROT 6.8 6.1*  --   ALBUMIN 3.4* 3.0*  --   AST 30 23  --   ALT 26 20  --   ALKPHOS 255* 197*  --   BILITOT 0.5 0.6  --   GFRNONAA 51* >60 >60  ANIONGAP 9 10 11     Lipids No results for input(s): "CHOL", "TRIG", "HDL", "LABVLDL", "LDLCALC", "CHOLHDL" in the last 168 hours.  Hematology Recent Labs  Lab 12/27/22 1118 12/28/22 0353  WBC 7.2 8.3  RBC 4.24 4.06  HGB 11.1* 10.7*  HCT 37.4 36.2  MCV 88.2 89.2  MCH 26.2 26.4  MCHC 29.7* 29.6*  RDW 18.3* 18.4*  PLT 225 190   Thyroid  Recent Labs  Lab 12/28/22 0353  TSH 0.626  BNP Recent Labs  Lab 12/27/22 1658  BNP 768.0*    DDimer No results for input(s): "DDIMER" in the last 168 hours.   Radiology    DG Chest Portable 1 View Result Date: 12/27/2022 CLINICAL DATA:  Short of breath, right leg edema EXAM: PORTABLE CHEST 1 VIEW COMPARISON:  09/29/2022 FINDINGS: Single frontal view of the chest demonstrates enlarged cardiac silhouette. No airspace disease, effusion, or pneumothorax. No acute bony abnormalities. IMPRESSION: 1. Enlarged cardiac silhouette.  No acute airspace disease. Electronically Signed   By: Sharlet Salina M.D.   On: 12/27/2022 17:47    Cardiac Studies     Patient Profile     67 y.o. female  with a hx of diastolic heart failure, HCM, pulmonary hypertension, atrial fibrillation, hypertension who is being seen for evaluation of heart failure  Assessment & Plan     Acute on chronic diastolic heart failure: Presented with volume overload, started on IV Lasix.  Would be cautious with overdiuresis in setting of HOCM.  She currently appears euvolemic.  Will hold off on further Lasix for now, check echocardiogram to  evaluate LVOT gradients.  Unna boots applied for LE edema.   HOCM: Echo 09/16/2022 showed findings concerning for HOCM with basal septum measuring 20 mm and LVOT gradient 75 mmHg at rest. -Check echo to evaluate LVOT gradients   Persistent atrial fibrillation: Underwent TEE/DCCV 09/2022.  She is back in A-fib but rates appear well-controlled.  Continue Toprol-XL.  Continue Eliquis for anticoagulation   Mitral regurgitation: Moderate to severe on echo 09/16/2022.  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  For questions or updates, please contact [facility] Please consult [email or web address] for contact info under        Signed, Little Ishikawa, MD  12/29/2022, 12:04 PM

## 2022-12-29 NOTE — Progress Notes (Signed)
PROGRESS NOTE  Christine Jacobson UJW:[id number] DOB: [birth year]   PCP: Karenann Cai, NP  Patient is from: Home.  Lives alone.  Uses rolling walker at baseline.  DOA: 12/27/2022 LOS: 2  Chief complaints Chief Complaint  Patient presents with   Wound Check   Leg Swelling     Brief Narrative / Interim history: 67 y.o. female with PMH of diastolic CHF, HOCM, PAH, moderate TR, A-fib on Eliquis, HTN and obesity presented to ED with progressive right leg swelling, and admitted with acute on chronic diastolic CHF and bilateral lower extremity edema.  In ED, vital stable.  Labs significant for bicarb of 16, Cr 1.16 (1.29 on 11/7), ALP 255, BNP 768, troponin 17, Hgb 11.1.  CXR with enlarged cardiac silhouette.  Started on IV Lasix.  The next day, edema improved.  Continued on IV Lasix.  Cardiology consulted and echocardiogram ordered.  Subjective: Seen and examined earlier this morning.  No major events overnight of this morning.  No complaints.  Denies chest pain, shortness of breath, dizziness.  Excellent urine output.  Renal function stable. Objective: Vitals:   12/28/22 1956 12/29/22 0432 12/29/22 0500 12/29/22 1122  BP: 104/74 115/82  107/73  Pulse: 98 86  85  Resp: 20 19    Temp: 98.2 F (36.8 C) 98.3 F (36.8 C)  98 F (36.7 C)  TempSrc: Oral Oral  Oral  SpO2: 98% 94%    Weight:   83 kg   Height:        Examination:  GENERAL: No apparent distress.  Nontoxic. HEENT: MMM.  Vision and hearing grossly intact.  NECK: Supple.  No apparent JVD.  RESP:  No IWOB.  Fair aeration bilaterally. CVS:  IR. Heart sounds normal.  ABD/GI/GU: BS+. Abd soft, NTND.  MSK/EXT:  Moves extremities.  Unna boots bilaterally. SKIN: Some erythema and weeping from RLE. NEURO: Awake, alert and oriented appropriately.  No apparent focal neuro deficit. PSYCH: Calm. Normal affect.   Procedures:  None  Microbiology summarized: None  Assessment and plan: Bilateral lower extremity edema:  Significant BLE edema with erythema and weeping in RLE.  Now with unna boot. Acute on chronic diastolic CHF: No significant cardiopulmonary symptoms other than some dyspnea, DOE and BLE edema.  Lungs clear.  CXR without acute finding.  BNP elevated to 768 (higher than prior value).  TTE in 08/2022 with LVEF of 65 to 70%, HOCM, RVSP of 64.8, massively dilated LAE, moderate to severe MR and moderate TR. however, TEE on 9/3 with LVEF of 70 to 75%, severe asymmetric LVH, moderate TR, mild MR and normal RVSP.  TSH normal.  Reports compliance with torsemide.  Started on IV Lasix.  Net -3.3 L. -Cardiology on board-holding further diuretics. -Follow echocardiogram -Strict intake and output, daily weight, renal functions and electrolyte -Continue Unna boot  HOCM:  Echo 09/16/2022 showed findings concerning for HOCM with basal septum measuring 20 mm and LVOT gradient 75 mmHg at rest. -Follow echocardiogram  Mitral valve regurgitation: Moderate to severe on TTE in 08/2022. -Follow echocardiogram   Persistent A-fib: s/p  TEE/DCCV 09/2022.  Back in A-fib but rate controlled.  On Toprol-XL and Eliquis at home. -Continue home Eliquis and Toprol-XL. 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  -Follow echocardiogram   Non-anion gap metabolic acidosis: Unclear etiology but improving with diuretics. -Continue diuretics as above  Elevated ALP: Congestive hepatopathy?  Improving.   Hyperlipidemia -Continue home Lipitor   CKD-3A: Stable -  Continue monitoring   Obesity:  Body mass index is 31.41 kg/m.          DVT prophylaxis:   apixaban (ELIQUIS) tablet 5 mg  Code Status: Full code Family Communication: None at bedside Level of care: Telemetry Status is: Inpatient Remains inpatient appropriate because: Acute diastolic CHF and edema   Final disposition: Likely home once medically stable Consultants:  Cardiology  55  minutes with more than 50% spent in reviewing records, counseling patient/family and coordinating care.   Sch Meds:  Scheduled Meds:  apixaban  5 mg Oral BID   ascorbic acid  500 mg Oral Daily   atorvastatin  80 mg Oral Daily   empagliflozin  10 mg Oral Daily   escitalopram  5 mg Oral Daily   hydrOXYzine  10 mg Oral QHS   metoprolol succinate  25 mg Oral Daily   zinc sulfate (50mg  elemental zinc)  220 mg Oral Daily   Continuous Infusions: PRN Meds:.acetaminophen **OR** acetaminophen, ondansetron **OR** ondansetron (ZOFRAN) IV, mouth rinse, oxyCODONE, polyethylene glycol, senna-docusate  Antimicrobials: Anti-infectives (From admission, onward)    None        I have personally reviewed the following labs and images: CBC: Recent Labs  Lab 12/27/22 1118 12/28/22 0353  WBC 7.2 8.3  HGB 11.1* 10.7*  HCT 37.4 36.2  MCV 88.2 89.2  PLT 225 190   BMP &GFR Recent Labs  Lab 12/27/22 1118 12/28/22 0353 12/29/22 0402  NA 136 139 139  K 4.3 3.5 3.4*  CL 111 111 107  CO2 16* 18* 21*  GLUCOSE 97 92 100*  BUN 27* 23 18  CREATININE 1.16* 0.92 0.95  CALCIUM 9.2 8.9 9.0  MG  --  1.9 1.9   Estimated Creatinine Clearance: 58.2 mL/min (by C-G formula based on SCr of 0.95 mg/dL). Liver & Pancreas: Recent Labs  Lab 12/27/22 1118 12/28/22 0353  AST 30 23  ALT 26 20  ALKPHOS 255* 197*  BILITOT 0.5 0.6  PROT 6.8 6.1*  ALBUMIN 3.4* 3.0*   No results for input(s): "LIPASE", "AMYLASE" in the last 168 hours. No results for input(s): "AMMONIA" in the last 168 hours. Diabetic: No results for input(s): "HGBA1C" in the last 72 hours. No results for input(s): "GLUCAP" in the last 168 hours. Cardiac Enzymes: No results for input(s): "CKTOTAL", "CKMB", "CKMBINDEX", "TROPONINI" in the last 168 hours. No results for input(s): "PROBNP" in the last 8760 hours. Coagulation Profile: No results for input(s): "INR", "PROTIME" in the last 168 hours. Thyroid Function Tests: Recent Labs     12/28/22 0353  TSH 0.626   Lipid Profile: No results for input(s): "CHOL", "HDL", "LDLCALC", "TRIG", "CHOLHDL", "LDLDIRECT" in the last 72 hours. Anemia Panel: No results for input(s): "VITAMINB12", "FOLATE", "FERRITIN", "TIBC", "IRON", "RETICCTPCT" in the last 72 hours. Urine analysis:    Component Value Date/Time   COLORURINE YELLOW 06/03/2022 0400   APPEARANCEUR HAZY (A) 06/03/2022 0400   LABSPEC 1.019 06/03/2022 0400   PHURINE 5.0 06/03/2022 0400   GLUCOSEU NEGATIVE 06/03/2022 0400   HGBUR SMALL (A) 06/03/2022 0400   BILIRUBINUR NEGATIVE 06/03/2022 0400   KETONESUR NEGATIVE 06/03/2022 0400   PROTEINUR 30 (A) 06/03/2022 0400   NITRITE NEGATIVE 06/03/2022 0400   LEUKOCYTESUR MODERATE (A) 06/03/2022 0400   Sepsis Labs: Invalid input(s): "PROCALCITONIN", "LACTICIDVEN"  Microbiology: No results found for this or any previous visit (from the past 240 hours).  Radiology Studies: No results found.    Meshulem Onorato T. Jordany Russett [facility]  If 7PM-7AM, please contact  night-coverage [email or web address] 12/29/2022, 2:46 PM

## 2022-12-29 NOTE — Progress Notes (Addendum)
  Echocardiogram 2D Echocardiogram has been performed.  Leda Roys RDCS 12/29/2022, 2:49 PM

## 2022-12-29 NOTE — TOC Progression Note (Signed)
Transition of Care [facility]) - Progression Note    Patient Details  Name: Christine Jacobson MRN: [id number] Date of Birth: 05/22/1955  Transition of Care [facility]) CM/SW Contact  Howell Rucks, RN Phone Number: 12/29/2022, 2:07 PM  Clinical Narrative:  Met with pt at bedside to follow up PT recommendation for [facility] PT/OT, pt reports she is still thinking about. TOC will continue to follow.      Expected Discharge Plan: Home w Home Health Services Barriers to Discharge: Continued Medical Work up  Expected Discharge Plan and Services       Living arrangements for the past 2 months: Single Family Home                                       Social Determinants of Health (SDOH) Interventions SDOH Screenings   Food Insecurity: No Food Insecurity (12/27/2022)  Housing: Low Risk  (12/29/2022)  Recent Concern: Housing - Medium Risk (12/27/2022)  Transportation Needs: No Transportation Needs (12/27/2022)  Utilities: Not At Risk (12/27/2022)  Depression (PHQ2-9): Low Risk  (10/26/2021)  Financial Resource Strain: Low Risk  (12/03/2021)  Tobacco Use: Medium Risk (12/27/2022)    Readmission Risk Interventions    12/28/2022   11:28 AM 08/07/2022   11:10 AM 07/14/2022    2:40 PM  Readmission Risk Prevention Plan  Post Dischage Appt   Complete  Medication Screening   Complete  Transportation Screening Complete Complete   PCP or Specialist Appt within 5-7 Days  Complete   PCP or Specialist Appt within 3-5 Days Complete    Home Care Screening  Complete   Medication Review (RN CM)  Complete   HRI or Home Care Consult Complete    Social Work Consult for Recovery Care Planning/Counseling Complete    Palliative Care Screening Not Applicable    Medication Review Oceanographer) Complete

## 2022-12-29 NOTE — Plan of Care (Signed)
  Problem: Nutrition: Goal: Adequate nutrition will be maintained Outcome: Progressing   Problem: Coping: Goal: Level of anxiety will decrease Outcome: Progressing   Problem: Activity: Goal: Risk for activity intolerance will decrease Outcome: Not Progressing   

## 2022-12-30 DIAGNOSIS — I4819 Other persistent atrial fibrillation: Secondary | ICD-10-CM | POA: Diagnosis not present

## 2022-12-30 DIAGNOSIS — I5033 Acute on chronic diastolic (congestive) heart failure: Secondary | ICD-10-CM | POA: Diagnosis not present

## 2022-12-30 LAB — CBC
HCT: 35.8 % — ABNORMAL LOW (ref 36.0–46.0)
Hemoglobin: 10.9 g/dL — ABNORMAL LOW (ref 12.0–15.0)
MCH: 26.8 pg (ref 26.0–34.0)
MCHC: 30.4 g/dL (ref 30.0–36.0)
MCV: 88.2 fL (ref 80.0–100.0)
Platelets: 237 10*3/uL (ref 150–400)
RBC: 4.06 MIL/uL (ref 3.87–5.11)
RDW: 18.1 % — ABNORMAL HIGH (ref 11.5–15.5)
WBC: 7.6 10*3/uL (ref 4.0–10.5)
nRBC: 0 % (ref 0.0–0.2)

## 2022-12-30 LAB — RENAL FUNCTION PANEL
Albumin: 3.1 g/dL — ABNORMAL LOW (ref 3.5–5.0)
Anion gap: 7 (ref 5–15)
BUN: 20 mg/dL (ref 8–23)
CO2: 26 mmol/L (ref 22–32)
Calcium: 9.4 mg/dL (ref 8.9–10.3)
Chloride: 106 mmol/L (ref 98–111)
Creatinine, Ser: 1.23 mg/dL — ABNORMAL HIGH (ref 0.44–1.00)
GFR, Estimated: 48 mL/min — ABNORMAL LOW (ref >60)
Glucose, Bld: 98 mg/dL (ref 70–99)
Phosphorus: 3 mg/dL (ref 2.5–4.6)
Potassium: 4.4 mmol/L (ref 3.5–5.1)
Sodium: 139 mmol/L (ref 135–145)

## 2022-12-30 LAB — MAGNESIUM: Magnesium: 2.1 mg/dL (ref 1.7–2.4)

## 2022-12-30 NOTE — Progress Notes (Signed)
PT Cancellation Note  Patient Details Name: Christine Jacobson MRN: [id number] DOB: 1955/08/08   Cancelled Treatment:    Reason Eval/Treat Not Completed: Medical issues which prohibited therapy (pt reports she's feeling very poorly 2*  having vomiting and diarrhea. She stated she's been able to walk to the bathroom. Will follow.)   Tamala Ser PT 12/30/2022  Acute Rehabilitation Services  Office [phone]

## 2022-12-30 NOTE — Progress Notes (Signed)
Pt experiencing diarrhea and asks chaplain to return later, which chaplain agrees to do.

## 2022-12-30 NOTE — Progress Notes (Signed)
Chaplain stops by again to find pt asleep. Chaplain offers pastoral presence.

## 2022-12-30 NOTE — Progress Notes (Addendum)
Patient Name: Christine Jacobson Date of Encounter: 12/30/2022 [facility] Cardiologist: Thomasene Ripple, DO   Interval Summary  .    Patient reports fatigue this morning, did not sleep well. She admits to frustration/confusion about this hospitalization and why it was necessary. She otherwise is without focal symptoms this morning.   Vital Signs .    Vitals:   12/30/22 0500 12/30/22 0600 12/30/22 0700 12/30/22 0800  BP:      Pulse:      Resp: 19 20 16 17   Temp:      TempSrc:      SpO2:      Weight:      Height:        Intake/Output Summary (Last 24 hours) at 12/30/2022 0837 Last data filed at 12/30/2022 0600 Gross per 24 hour  Intake 560 ml  Output --  Net 560 ml      12/29/2022    5:00 AM 12/28/2022    5:30 AM 12/27/2022   10:53 PM  Last 3 Weights  Weight (lbs) 182 lb 15.7 oz 182 lb 5.1 oz 182 lb 8 oz  Weight (kg) 83 kg 82.7 kg 82.781 kg      Telemetry/ECG    Atrial fibrillation with controlled ventricular rates, 70s-90s - Personally Reviewed  Physical Exam .   GEN: No acute distress.   Neck: No JVD Cardiac: irregularly irregular, no rubs, or gallops. 3/6 systolic murmur over apex. Respiratory: Clear to auscultation bilaterally. GI: Soft, nontender, non-distended  MS: No edema  Assessment & Plan .     Acute on chronic diastolic heart failure HOCM    Echo 09/16/2022 showed findings concerning for HOCM with basal septum measuring 20 mm and LVOT gradient 75 mmHg at rest. Patient presented hypervolemic and was started on IV lasix. Yesterday felt to be euvolemic appearing. Repeat TTE shows stable LVEF 65-70% with severe concentric LVH. Peak LVOT gradient 61 mmHg, study overall unchanged from prior.  Would hold off on further diuresis in setting of her HOCM.  Currently appears euvolemic.  Recommend discharging on p.o. Lasix 40 mg daily as needed; will check daily weights and can take Lasix if gains more than 3 pounds in 1 day or 5 pounds in 1  week Continue Toprol XL 25mg , Jardiance 10mg . Outpatient visit with Dr. Timoteo Gaul for HOCM pending.  Persistent atrial fibrillation   Patient s/p TEE/DCCV in September 2024. Patient back in afib this admission but with well controlled ventricular rate.   Continue Toprol XL 25mg  and Eliquis 5mg  BID.  Mitral regurgitation   Moderate to severe on 09/16/22 TTE. Repeat study this admission shows stable MVR.   Will need ongoing outpatient cardiology follow up and serial echocardiograms to monitor.  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 Continue to monitor in outpatient setting.   [facility] will sign off.   Medication Recommendations: Eliquis 5 mg twice daily, Toprol-XL 25 mg daily.  Recommend monitoring daily weights, and taking Lasix 40 mg daily as needed if gains more than 3 pounds in 1 day or 5 pounds in 1 week Other recommendations (labs, testing, etc): BMET in 1 week Follow up as an outpatient: Has follow-up scheduled with Dr. Servando Salina on 01/27/2023 and follow-up in HCM clinic with Dr. Izora Ribas on 02/20/2023   For questions or updates, please contact [facility] Please consult [email or web address] for contact info under     Signed, Perlie Gold, PA-C    Patient  seen and examined.  Agree with above documentation.  On exam, patient is alert and oriented, regular rate and rhythm, no murmurs, lungs CTAB, no LE edema or JVD.  Appears euvolemic.  She reports some diarrhea this morning. Given her HOCM, would hold off on further diuresis.  Would discharge on as needed Lasix 40 mg daily, recommend checking daily weights and can take Lasix as needed if gains more than 3 pounds in 1 day or 5 pounds 1 week.    Little Ishikawa, MD

## 2022-12-30 NOTE — Progress Notes (Signed)
PROGRESS NOTE  Christine Jacobson ZOX:[id number] DOB: [birth year]   PCP: Karenann Cai, NP  Patient is from: Home.  Lives alone.  Uses rolling walker at baseline.  DOA: 12/27/2022 LOS: 3  Chief complaints Chief Complaint  Patient presents with   Wound Check   Leg Swelling     Brief Narrative / Interim history: 67 y.o. female with PMH of diastolic CHF, HOCM, PAH, moderate TR, A-fib on Eliquis, HTN and obesity presented to ED with progressive right leg swelling, and admitted with acute on chronic diastolic CHF and bilateral lower extremity edema.  In ED, vital stable.  Labs significant for bicarb of 16, Cr 1.16 (1.29 on 11/7), ALP 255, BNP 768, troponin 17, Hgb 11.1.  CXR with enlarged cardiac silhouette.  Started on IV Lasix.  The next day, edema improved.  Continued on IV Lasix.  Cardiology consulted and echocardiogram ordered.  Echocardiogram without significant change from prior.  Diuretics held.  Cardiology recommended outpatient follow-up.  However, patient had an episode of nausea, vomiting and diarrhea when she got out of the bed.  Symptoms improved after she got back into the bed.    Subjective: Seen and examined earlier this morning.  Feels tired.  Anxious to go home.  Per RN, patient had nausea, vomiting and diarrhea after she got out of the bed for breakfast.  Symptoms resolved after she got back into the bed.   Objective: Vitals:   12/30/22 0600 12/30/22 0700 12/30/22 0800 12/30/22 1009  BP:    127/89  Pulse:    89  Resp: 20 16 17 18   Temp:    97.6 F (36.4 C)  TempSrc:    Oral  SpO2:    98%  Weight:      Height:        Examination:  GENERAL: No apparent distress.  Nontoxic. HEENT: MMM.  Vision and hearing grossly intact.  NECK: Supple.  No apparent JVD.  RESP:  No IWOB.  Fair aeration bilaterally. CVS:  IR. Heart sounds normal.  ABD/GI/GU: BS+. Abd soft, NTND.  MSK/EXT:  Moves extremities.  Unna boots bilaterally. SKIN: Some erythema and weeping  from RLE. NEURO: Awake, alert and oriented appropriately.  No apparent focal neuro deficit. PSYCH: Calm. Normal affect.   Procedures:  None  Microbiology summarized: None  Assessment and plan: Bilateral lower extremity edema: Significant BLE edema with erythema and weeping in RLE.  Now with unna boot. Acute on chronic diastolic CHF: No significant cardiopulmonary symptoms other than some dyspnea, DOE and BLE edema.  Lungs clear.  CXR without acute finding.  BNP elevated to 768 (higher than prior value).  TTE in 08/2022 with LVEF of 65 to 70%, HOCM, RVSP of 64.8, massively dilated LAE, moderate to severe MR and moderate TR. however, TEE on 9/3 with LVEF of 70 to 75%, severe asymmetric LVH, moderate TR, mild MR and normal RVSP.  Repeat TTE without significant change.  TSH normal.  Reports compliance with torsemide.  Diuresed with IV Lasix.  Net 2.74 L.  Diuretics held by cardiology. -Cardiology recommended diuretics as needed on discharge and outpatient follow-up.  See cardiology note for more. -Strict intake and output, daily weight, renal functions and electrolyte -Continue Unna boot  HOCM:  Echo 09/16/2022 showed findings concerning for HOCM with basal septum measuring 20 mm and LVOT gradient 75 mmHg at rest.  Repeat echocardiogram without significant change. -Cardiology recommends outpatient follow-up  Mitral valve regurgitation: Moderate to severe on TTE in 08/2022. -Follow echocardiogram  Persistent A-fib: s/p  TEE/DCCV 09/2022.  Back in A-fib but rate controlled.  On Toprol-XL and Eliquis at home. -Continue home Eliquis and Toprol-XL. 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   Nausea/vomiting/diarrhea: Unclear etiology of this.  Abdominal exam benign. -Antiemetics as needed -Continue monitoring   Non-anion gap metabolic acidosis: Unclear etiology but improving with diuretics. -Continue diuretics as  above  Elevated ALP: Congestive hepatopathy?  Improving.  Physical deconditioning: Patient reports doing poorly.  Anxious to go home. -OOB/PT/OT   Hyperlipidemia -Continue home Lipitor   CKD-3A: Stable -Continue monitoring   Obesity:  Body mass index is 31.41 kg/m.          DVT prophylaxis:   apixaban (ELIQUIS) tablet 5 mg  Code Status: Full code Family Communication: None at bedside Level of care: Telemetry Status is: Inpatient Remains inpatient appropriate because: Acute diastolic CHF, edema, nausea, vomiting and diarrhea   Final disposition: Likely home in the next 24 to 48 hours. Consultants:  Cardiology-signed off.  55 minutes with more than 50% spent in reviewing records, counseling patient/family and coordinating care.   Sch Meds:  Scheduled Meds:  apixaban  5 mg Oral BID   ascorbic acid  500 mg Oral Daily   atorvastatin  80 mg Oral Daily   empagliflozin  10 mg Oral Daily   escitalopram  5 mg Oral Daily   hydrOXYzine  10 mg Oral QHS   metoprolol succinate  25 mg Oral Daily   zinc sulfate (50mg  elemental zinc)  220 mg Oral Daily   Continuous Infusions: PRN Meds:.acetaminophen **OR** acetaminophen, ondansetron **OR** ondansetron (ZOFRAN) IV, mouth rinse, oxyCODONE, polyethylene glycol, senna-docusate  Antimicrobials: Anti-infectives (From admission, onward)    None        I have personally reviewed the following labs and images: CBC: Recent Labs  Lab 12/27/22 1118 12/28/22 0353 12/30/22 0334  WBC 7.2 8.3 7.6  HGB 11.1* 10.7* 10.9*  HCT 37.4 36.2 35.8*  MCV 88.2 89.2 88.2  PLT 225 190 237   BMP &GFR Recent Labs  Lab 12/27/22 1118 12/28/22 0353 12/29/22 0402 12/30/22 0334  NA 136 139 139 139  K 4.3 3.5 3.4* 4.4  CL 111 111 107 106  CO2 16* 18* 21* 26  GLUCOSE 97 92 100* 98  BUN 27* 23 18 20   CREATININE 1.16* 0.92 0.95 1.23*  CALCIUM 9.2 8.9 9.0 9.4  MG  --  1.9 1.9 2.1  PHOS  --   --   --  3.0   Estimated Creatinine  Clearance: 45 mL/min (A) (by C-G formula based on SCr of 1.23 mg/dL (H)). Liver & Pancreas: Recent Labs  Lab 12/27/22 1118 12/28/22 0353 12/30/22 0334  AST 30 23  --   ALT 26 20  --   ALKPHOS 255* 197*  --   BILITOT 0.5 0.6  --   PROT 6.8 6.1*  --   ALBUMIN 3.4* 3.0* 3.1*   No results for input(s): "LIPASE", "AMYLASE" in the last 168 hours. No results for input(s): "AMMONIA" in the last 168 hours. Diabetic: No results for input(s): "HGBA1C" in the last 72 hours. No results for input(s): "GLUCAP" in the last 168 hours. Cardiac Enzymes: No results for input(s): "CKTOTAL", "CKMB", "CKMBINDEX", "TROPONINI" in the last 168 hours. No results for input(s): "PROBNP" in the last 8760 hours. Coagulation Profile: No results for input(s): "INR", "PROTIME" in the last 168 hours. Thyroid Function Tests: Recent Labs    12/28/22 0353  TSH 0.626   Lipid Profile: No results for input(s): "CHOL", "HDL", "LDLCALC", "TRIG", "CHOLHDL", "LDLDIRECT" in the last 72 hours. Anemia Panel: No results for input(s): "VITAMINB12", "FOLATE", "FERRITIN", "TIBC", "IRON", "RETICCTPCT" in the last 72 hours. Urine analysis:    Component Value Date/Time   COLORURINE YELLOW 06/03/2022 0400   APPEARANCEUR HAZY (A) 06/03/2022 0400   LABSPEC 1.019 06/03/2022 0400   PHURINE 5.0 06/03/2022 0400   GLUCOSEU NEGATIVE 06/03/2022 0400   HGBUR SMALL (A) 06/03/2022 0400   BILIRUBINUR NEGATIVE 06/03/2022 0400   KETONESUR NEGATIVE 06/03/2022 0400   PROTEINUR 30 (A) 06/03/2022 0400   NITRITE NEGATIVE 06/03/2022 0400   LEUKOCYTESUR MODERATE (A) 06/03/2022 0400   Sepsis Labs: Invalid input(s): "PROCALCITONIN", "LACTICIDVEN"  Microbiology: No results found for this or any previous visit (from the past 240 hours).  Radiology Studies: ECHOCARDIOGRAM LIMITED Result Date: 12/29/2022    ECHOCARDIOGRAM LIMITED REPORT   Patient Name:   RAHEEL PANGALLO Batts Date of Exam: 12/29/2022 Medical Rec #:  [id number]          Height:        64.0 in Accession #:    [id number]         Weight:       183.0 lb Date of Birth:  [birth year]          BSA:          1.884 m Patient Age:    67 years           BP:           115/82 mmHg Patient Gender: F                  HR:           76 bpm. Exam Location:  Inpatient Procedure: Limited Echo, Cardiac Doppler, Color Doppler and Intracardiac            Opacification Agent Indications:    HOCM I42.1  History:        Patient has prior history of Echocardiogram examinations, most                 recent 09/20/2022. Risk Factors:Hypertension and Dyslipidemia.  Sonographer:    Harriette Bouillon RDCS Referring Phys: [id number] CHRISTOPHER L SCHUMANN IMPRESSIONS  1. Left ventricular ejection fraction, by estimation, is 65 to 70%. The left ventricle has normal function. The left ventricle has no regional wall motion abnormalities. There is severe concentric left ventricular hypertrophy.  2. The mitral valve is degenerative. Moderate to severe mitral valve regurgitation. No evidence of mitral stenosis. Severe mitral annular calcification.  3. The aortic valve is tricuspid. There is mild calcification of the aortic valve. There is mild thickening of the aortic valve. Aortic valve regurgitation is not visualized. Aortic valve sclerosis is present, with no evidence of aortic valve stenosis. Comparison(s): No significant change from prior study. HOCM with SAM of mitral valve, mod-severe MR, and peak LVOT gradient 61 mmHg. FINDINGS  Left Ventricle: Systolic anterior motion of the mitral valve. LVOT peak gradient 61 mmHg. Left ventricular ejection fraction, by estimation, is 65 to 70%. The left ventricle has normal function. The left ventricle has no regional wall motion abnormalities. Definity contrast agent was given IV to delineate the left ventricular endocardial borders. There is severe concentric left ventricular hypertrophy. Mitral Valve: The mitral valve is degenerative in appearance. Severe mitral annular calcification. Moderate  to severe mitral valve regurgitation, with eccentric posteriorly directed jet. No evidence of mitral valve stenosis. Aortic  Valve: The aortic valve is tricuspid. There is mild calcification of the aortic valve. There is mild thickening of the aortic valve. Aortic valve regurgitation is not visualized. Aortic valve sclerosis is present, with no evidence of aortic valve stenosis. LEFT VENTRICLE PLAX 2D LVIDd:         4.00 cm LVIDs:         2.10 cm LV PW:         2.04 cm LV IVS:        2.37 cm LVOT diam:     1.90 cm LVOT Area:     2.84 cm  LEFT ATRIUM         Index LA diam:    5.00 cm 2.65 cm/m   AORTA Ao Root diam: 3.00 cm  SHUNTS Systemic Diam: 1.90 cm Jodelle Red MD Electronically signed by Jodelle Red MD Signature Date/Time: 12/29/2022/6:41:20 PM    Final       Aalivia Mcgraw T. Nahum Sherrer [facility]  If 7PM-7AM, please contact night-coverage [email or web address] 12/30/2022, 2:31 PM

## 2022-12-30 NOTE — TOC Progression Note (Signed)
Transition of Care [facility]) - Progression Note    Patient Details  Name: Christine Jacobson MRN: [id number] Date of Birth: 03/22/1955  Transition of Care [facility]) CM/SW Contact  Howell Rucks, RN Phone Number: 12/30/2022, 3:12 PM  Clinical Narrative: Met with pt at bedside, declines HH PT/OT at this time. TOC will continue to follow.       Expected Discharge Plan: Home w Home Health Services Barriers to Discharge: Continued Medical Work up  Expected Discharge Plan and Services       Living arrangements for the past 2 months: Single Family Home                                       Social Determinants of Health (SDOH) Interventions SDOH Screenings   Food Insecurity: No Food Insecurity (12/27/2022)  Housing: Low Risk  (12/29/2022)  Recent Concern: Housing - Medium Risk (12/27/2022)  Transportation Needs: No Transportation Needs (12/27/2022)  Utilities: Not At Risk (12/27/2022)  Depression (PHQ2-9): Low Risk  (10/26/2021)  Financial Resource Strain: Low Risk  (12/03/2021)  Tobacco Use: Medium Risk (12/27/2022)    Readmission Risk Interventions    12/28/2022   11:28 AM 08/07/2022   11:10 AM 07/14/2022    2:40 PM  Readmission Risk Prevention Plan  Post Dischage Appt   Complete  Medication Screening   Complete  Transportation Screening Complete Complete   PCP or Specialist Appt within 5-7 Days  Complete   PCP or Specialist Appt within 3-5 Days Complete    Home Care Screening  Complete   Medication Review (RN CM)  Complete   HRI or Home Care Consult Complete    Social Work Consult for Recovery Care Planning/Counseling Complete    Palliative Care Screening Not Applicable    Medication Review Oceanographer) Complete

## 2022-12-31 ENCOUNTER — Other Ambulatory Visit: Payer: Self-pay

## 2022-12-31 ENCOUNTER — Other Ambulatory Visit (HOSPITAL_COMMUNITY): Payer: Self-pay

## 2022-12-31 DIAGNOSIS — I4819 Other persistent atrial fibrillation: Secondary | ICD-10-CM

## 2022-12-31 DIAGNOSIS — I272 Pulmonary hypertension, unspecified: Secondary | ICD-10-CM | POA: Diagnosis not present

## 2022-12-31 DIAGNOSIS — I421 Obstructive hypertrophic cardiomyopathy: Secondary | ICD-10-CM | POA: Diagnosis not present

## 2022-12-31 DIAGNOSIS — I5033 Acute on chronic diastolic (congestive) heart failure: Secondary | ICD-10-CM | POA: Diagnosis not present

## 2022-12-31 LAB — RENAL FUNCTION PANEL
Albumin: 3.1 g/dL — ABNORMAL LOW (ref 3.5–5.0)
Anion gap: 6 (ref 5–15)
BUN: 20 mg/dL (ref 8–23)
CO2: 24 mmol/L (ref 22–32)
Calcium: 9.3 mg/dL (ref 8.9–10.3)
Chloride: 105 mmol/L (ref 98–111)
Creatinine, Ser: 1.04 mg/dL — ABNORMAL HIGH (ref 0.44–1.00)
GFR, Estimated: 58 mL/min — ABNORMAL LOW (ref >60)
Glucose, Bld: 106 mg/dL — ABNORMAL HIGH (ref 70–99)
Phosphorus: 3.7 mg/dL (ref 2.5–4.6)
Potassium: 4.4 mmol/L (ref 3.5–5.1)
Sodium: 135 mmol/L (ref 135–145)

## 2022-12-31 LAB — CBC
HCT: 36.4 % (ref 36.0–46.0)
Hemoglobin: 10.7 g/dL — ABNORMAL LOW (ref 12.0–15.0)
MCH: 26.2 pg (ref 26.0–34.0)
MCHC: 29.4 g/dL — ABNORMAL LOW (ref 30.0–36.0)
MCV: 89.2 fL (ref 80.0–100.0)
Platelets: 240 10*3/uL (ref 150–400)
RBC: 4.08 MIL/uL (ref 3.87–5.11)
RDW: 17.6 % — ABNORMAL HIGH (ref 11.5–15.5)
WBC: 9.6 10*3/uL (ref 4.0–10.5)
nRBC: 0 % (ref 0.0–0.2)

## 2022-12-31 LAB — MAGNESIUM: Magnesium: 2.1 mg/dL (ref 1.7–2.4)

## 2022-12-31 MED ORDER — FUROSEMIDE 40 MG PO TABS
40.0000 mg | ORAL_TABLET | Freq: Every day | ORAL | 1 refills | Status: DC | PRN
  Filled 2022-12-31: qty 30, 30d supply, fill #0

## 2022-12-31 MED ORDER — TRAZODONE HCL 50 MG PO TABS
25.0000 mg | ORAL_TABLET | Freq: Every evening | ORAL | Status: DC | PRN
  Administered 2022-12-31: 25 mg via ORAL
  Filled 2022-12-31: qty 1

## 2022-12-31 NOTE — Progress Notes (Signed)
Bilateral Unna boot and dressing removed. Lower legs cleaned. Ortho Tech notified about changing the unna boots.

## 2022-12-31 NOTE — TOC Progression Note (Signed)
Transition of Care [facility]) - Progression Note    Patient Details  Name: Christine Jacobson MRN: [id number] Date of Birth: 10-28-1955  Transition of Care [facility]) CM/SW Contact  Georgie Chard, LCSW Phone Number: 12/31/2022, 11:49 AM  Clinical Narrative:    CSW met patient at bedside, patient was a little disturbed as she stated that she has a nurse that comes 2 times a week to take care of her unna boot. CSW did explain what HH would do for the patient. CSW than asked the patient again would she like home health per patient she has declined services. At this time there are no further TOC needs.    Expected Discharge Plan: Home w Home Health Services Barriers to Discharge: Continued Medical Work up  Expected Discharge Plan and Services       Living arrangements for the past 2 months: Single Family Home Expected Discharge Date: 12/31/22                                     Social Determinants of Health (SDOH) Interventions SDOH Screenings   Food Insecurity: No Food Insecurity (12/27/2022)  Housing: Low Risk  (12/29/2022)  Recent Concern: Housing - Medium Risk (12/27/2022)  Transportation Needs: No Transportation Needs (12/27/2022)  Utilities: Not At Risk (12/27/2022)  Depression (PHQ2-9): Low Risk  (10/26/2021)  Financial Resource Strain: Low Risk  (12/03/2021)  Tobacco Use: Medium Risk (12/27/2022)    Readmission Risk Interventions    12/28/2022   11:28 AM 08/07/2022   11:10 AM 07/14/2022    2:40 PM  Readmission Risk Prevention Plan  Post Dischage Appt   Complete  Medication Screening   Complete  Transportation Screening Complete Complete   PCP or Specialist Appt within 5-7 Days  Complete   PCP or Specialist Appt within 3-5 Days Complete    Home Care Screening  Complete   Medication Review (RN CM)  Complete   HRI or Home Care Consult Complete    Social Work Consult for Recovery Care Planning/Counseling Complete    Palliative Care Screening Not Applicable     Medication Review Oceanographer) Complete

## 2022-12-31 NOTE — Progress Notes (Signed)
Mobility Specialist - Progress Note   12/31/22 0924  Orthostatic Lying   BP- Lying 104/74  Pulse- Lying 86  Orthostatic Sitting  BP- Sitting (!) 79/67  Pulse- Sitting 120  Orthostatic Standing at 0 minutes  BP- Standing at 0 minutes 95/81  Pulse- Standing at 0 minutes 155  Mobility  Activity Stood at bedside  Level of Assistance Minimal assist, patient does 75% or more  Press photographer wheel walker  Range of Motion/Exercises Active Assistive  Activity Response Tolerated fair  Mobility visit 1 Mobility  Mobility Specialist Start Time (ACUTE ONLY) [id number]  Mobility Specialist Stop Time (ACUTE ONLY) [id number]  Mobility Specialist Time Calculation (min) (ACUTE ONLY) 10 min   Pt was found in bed and agreeable to attempt transfer to recliner chair and take orthostatic vitals. Pt vitals unable to complete due to pt sitting EOB during standing at 0 minutes vitals. C/o pain on R foot. Stated feeling "fine" throughout session. At EOS was left in bed with all needs met. Call bell in reach and RN notified.  Billey Chang Mobility Specialist

## 2022-12-31 NOTE — Progress Notes (Signed)
Orthopedic Tech Progress Note Patient Details:  Christine Jacobson 1956-01-02 [id number]  Ortho Devices Type of Ortho Device: Radio broadcast assistant Ortho Device/Splint Location: Bilateral Ortho Device/Splint Interventions: Ordered, Application, Adjustment   Post Interventions Patient Tolerated: Fair Instructions Provided: Adjustment of device, Care of device  Tonye Pearson 12/31/2022, 3:20 PM

## 2022-12-31 NOTE — Discharge Summary (Signed)
Physician Discharge Summary  Christine Jacobson RUE:[id number] DOB: January 13, 1956 DOA: 12/27/2022  PCP: Karenann Cai, NP  Admit date: 12/27/2022 Discharge date: 12/31/2022  6:24 PM  Admitted From: Home Disposition: Home Recommendations for Outpatient Follow-up:  Follow up with PCP in 1 week Cardiology to arrange outpatient follow-up Check BP, fluid status, CMP and CBC in 1 week Please follow up on the following pending results: None  Home Health: Patient refused home health. Equipment/Devices: Patient has appropriate DME  Discharge Condition: Stable CODE STATUS: Full code  Follow-up Information     Karenann Cai, NP. Schedule an appointment as soon as possible for a visit in 1 week(s).   Specialty: Nurse Practitioner Contact information: [address] [place] [address] [phone]                 Hospital course 67 y.o. female with PMH of diastolic CHF, HOCM, PAH, moderate TR, A-fib on Eliquis, HTN and obesity presented to ED with progressive right leg swelling, and admitted with acute on chronic diastolic CHF and bilateral lower extremity edema.  In ED, vital stable.  Labs significant for bicarb of 16, Cr 1.16 (1.29 on 11/7), ALP 255, BNP 768, troponin 17, Hgb 11.1.  CXR with enlarged cardiac silhouette.  Started on IV Lasix.   The next day, edema improved.  Continued on IV Lasix.  Cardiology consulted and echocardiogram ordered.  Echocardiogram without significant change from prior (see details below).  Diuretics held.  Cardiology recommended changing diuretics to p.o. Lasix as needed and outpatient follow-up.   Patient had brief episode of nausea, vomiting and diarrhea when she got out of the bed on 12/30/2022.  Symptoms improved after she got back into the bed.  She has not had further GI symptoms.   See individual problem list below for more.   Problems addressed during this hospitalization Bilateral lower extremity edema: Significant BLE edema  with erythema and weeping in RLE.  Now with unna boot. Acute on chronic diastolic CHF: No significant cardiopulmonary symptoms other than some dyspnea, DOE and BLE edema.  Lungs clear.  CXR without acute finding.  BNP elevated to 768 (higher than prior value).  TTE in 08/2022 with LVEF of 65 to 70%, HOCM, RVSP of 64.8, massively dilated LAE, moderate to severe MR and moderate TR. however, TEE on 9/3 with LVEF of 70 to 75%, severe asymmetric LVH, moderate TR, mild MR and normal RVSP.  Repeat TTE without significant change.  TSH normal.  Reports compliance with torsemide.  Diuresed with IV Lasix.  Net 2.74 L.  Diuretics held by cardiology due to HOCM and rising creatinine. -Cardiology recommended p.o. Lasix 40 mg as needed as needed for weight gain and outpatient follow-up. -Unna boots   HOCM:  Echo 09/16/2022 showed findings concerning for HOCM with basal septum measuring 20 mm and LVOT gradient 75 mmHg at rest.  Repeat echocardiogram without significant change. -Cardiology recommends outpatient follow-up   Mitral valve regurgitation: Moderate to severe on TTE in 08/2022.  Stable repeat echo. -Outpatient follow-up   Persistent A-fib: s/p  TEE/DCCV 09/2022.  Back in A-fib but rate controlled.  On Toprol-XL and Eliquis at home. -Continue home Eliquis and Toprol-XL.   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    Nausea/vomiting/diarrhea: Unclear etiology of this.  Abdominal exam benign.  Resolved.   Non-anion gap metabolic acidosis: Unclear etiology but resolved with diuretics.   Elevated ALP: Congestive hepatopathy?  Improved.   Physical deconditioning:  Patient reports doing poorly.  Anxious to go home. -HH PT/OT/RN but patient refused   Hyperlipidemia -Continue home Lipitor   CKD-3A: Stable -Continue monitoring   Obesity:  Body mass index is 30.92 kg/m.            Time spent 35 minutes  Vital signs Vitals:   12/30/22 1548  12/31/22 0428 12/31/22 0500 12/31/22 1216  BP:  127/78  100/77  Pulse:  (!) 105  89  Temp:  98.4 F (36.9 C)  98.1 F (36.7 C)  Resp: 15 17  18   Height:      Weight:   81.7 kg   SpO2:  91%  99%  TempSrc:  Oral  Oral  BMI (Calculated):   30.9      Discharge exam  GENERAL: No apparent distress.  Nontoxic. HEENT: MMM.  Vision and hearing grossly intact.  NECK: Supple.  No apparent JVD.  RESP:  No IWOB.  Fair aeration bilaterally. CVS:  RRR. Heart sounds normal.  ABD/GI/GU: BS+. Abd soft, NTND.  MSK/EXT:  Moves extremities.  Unna boots in place. SKIN: no apparent skin lesion or wound NEURO: Awake and alert. Oriented appropriately.  No apparent focal neuro deficit. PSYCH: Calm. Normal affect.   Discharge Instructions Discharge Instructions     Diet - low sodium heart healthy   Complete by: As directed    Discharge instructions   Complete by: As directed    It has been a pleasure taking care of you!  You were hospitalized due to significant leg swelling for which you have been treated with intravenous fluid medication and Unna boot.  We have changed your diuretics (fluid medication) per recommendation by cardiologist.  Please review your new medication list and the directions on your medications before you take them.  Continue using your Radio broadcast assistant.  Follow-up with your primary care doctor in 1 to 2 weeks or sooner if needed.  Follow-up with your cardiologist per their recommendation.   Take care,   Discharge wound care:   Complete by: As directed    Wound care  Once per day on Wednesday Saturday    Comments: Apply hydroconductive Drawtex [place] # [phone]) -order 5-6 pieces for each dressing change. Apply to weeping areas of the bilateral LEs. Cover with foam if possible or ok to have Unna's boots applied directly over Drawtex Contact Orthopedic tech to re-apply Unna's boot over dressing. Change 2x wk   Increase activity slowly   Complete by: As directed       Allergies as  of 12/31/2022       Reactions   Pork-derived Products Other (See Comments)   Religious preference        Medication List     STOP taking these medications    ibuprofen 200 MG tablet Commonly known as: ADVIL   midodrine 5 MG tablet Commonly known as: PROAMATINE   potassium chloride SA 20 MEQ tablet Commonly known as: Klor-Con M20   torsemide 20 MG tablet Commonly known as: DEMADEX       TAKE these medications    apixaban 5 MG Tabs tablet Commonly known as: Eliquis Take 1 tablet (5 mg total) by mouth 2 (two) times daily.   ascorbic acid 500 MG tablet Commonly known as: VITAMIN C Take 1 tablet (500 mg total) by mouth daily for 30 days   atorvastatin 80 MG tablet Commonly known as: LIPITOR Take 1 tablet (80 mg total) by mouth daily.   escitalopram 5 MG tablet Commonly  known as: LEXAPRO Take 1 tablet (5 mg total) by mouth daily. What changed: when to take this   furosemide 40 MG tablet Commonly known as: Lasix Take 1 tablet (40 mg total) by mouth daily as needed. For weight gains more than 3 pounds in 1 day or 5 pounds in 1 week. Stop torsemide   hydrOXYzine 10 MG tablet Commonly known as: ATARAX Take 1 tablet (10 mg total) by mouth at bedtime. What changed:  when to take this reasons to take this   Jardiance 10 MG Tabs tablet Generic drug: empagliflozin Take 1 tablet (10 mg total) by mouth daily.   metoprolol succinate 25 MG 24 hr tablet Commonly known as: TOPROL-XL Take 1 tablet (25 mg total) by mouth daily.   pantoprazole 40 MG tablet Commonly known as: PROTONIX Take 1 tablet (40 mg total) by mouth daily.   Zinc Sulfate 220 (50 Zn) MG Tabs Take 1 tablet (220 mg total) by mouth daily for 30 days               Discharge Care Instructions  (From admission, onward)           Start     Ordered   12/31/22 0000  Discharge wound care:       Comments: Wound care  Once per day on Wednesday Saturday    Comments: Apply hydroconductive  Drawtex [place] # [phone]) -order 5-6 pieces for each dressing change. Apply to weeping areas of the bilateral LEs. Cover with foam if possible or ok to have Unna's boots applied directly over Drawtex Contact Orthopedic tech to re-apply Unna's boot over dressing. Change 2x wk   12/31/22 9147            Consultations: Cardiology  Procedures/Studies:   ECHOCARDIOGRAM LIMITED Result Date: 12/29/2022    ECHOCARDIOGRAM LIMITED REPORT   Patient Name:   Christine Jacobson Dement Date of Exam: 12/29/2022 Medical Rec #:  [id number]          Height:       64.0 in Accession #:    [id number]         Weight:       183.0 lb Date of Birth:  1955/05/30          BSA:          1.884 m Patient Age:    67 years           BP:           115/82 mmHg Patient Gender: F                  HR:           76 bpm. Exam Location:  Inpatient Procedure: Limited Echo, Cardiac Doppler, Color Doppler and Intracardiac            Opacification Agent Indications:    HOCM I42.1  History:        Patient has prior history of Echocardiogram examinations, most                 recent 09/20/2022. Risk Factors:Hypertension and Dyslipidemia.  Sonographer:    Harriette Bouillon RDCS Referring Phys: [id number] CHRISTOPHER L SCHUMANN IMPRESSIONS  1. Left ventricular ejection fraction, by estimation, is 65 to 70%. The left ventricle has normal function. The left ventricle has no regional wall motion abnormalities. There is severe concentric left ventricular hypertrophy.  2. The mitral valve is degenerative. Moderate to severe mitral valve regurgitation. No evidence of mitral  stenosis. Severe mitral annular calcification.  3. The aortic valve is tricuspid. There is mild calcification of the aortic valve. There is mild thickening of the aortic valve. Aortic valve regurgitation is not visualized. Aortic valve sclerosis is present, with no evidence of aortic valve stenosis. Comparison(s): No significant change from prior study. HOCM with SAM of mitral valve, mod-severe MR,  and peak LVOT gradient 61 mmHg. FINDINGS  Left Ventricle: Systolic anterior motion of the mitral valve. LVOT peak gradient 61 mmHg. Left ventricular ejection fraction, by estimation, is 65 to 70%. The left ventricle has normal function. The left ventricle has no regional wall motion abnormalities. Definity contrast agent was given IV to delineate the left ventricular endocardial borders. There is severe concentric left ventricular hypertrophy. Mitral Valve: The mitral valve is degenerative in appearance. Severe mitral annular calcification. Moderate to severe mitral valve regurgitation, with eccentric posteriorly directed jet. No evidence of mitral valve stenosis. Aortic Valve: The aortic valve is tricuspid. There is mild calcification of the aortic valve. There is mild thickening of the aortic valve. Aortic valve regurgitation is not visualized. Aortic valve sclerosis is present, with no evidence of aortic valve stenosis. LEFT VENTRICLE PLAX 2D LVIDd:         4.00 cm LVIDs:         2.10 cm LV PW:         2.04 cm LV IVS:        2.37 cm LVOT diam:     1.90 cm LVOT Area:     2.84 cm  LEFT ATRIUM         Index LA diam:    5.00 cm 2.65 cm/m   AORTA Ao Root diam: 3.00 cm  SHUNTS Systemic Diam: 1.90 cm Jodelle Red MD Electronically signed by Jodelle Red MD Signature Date/Time: 12/29/2022/6:41:20 PM    Final    DG Chest Portable 1 View Result Date: 12/27/2022 CLINICAL DATA:  Short of breath, right leg edema EXAM: PORTABLE CHEST 1 VIEW COMPARISON:  09/29/2022 FINDINGS: Single frontal view of the chest demonstrates enlarged cardiac silhouette. No airspace disease, effusion, or pneumothorax. No acute bony abnormalities. IMPRESSION: 1. Enlarged cardiac silhouette.  No acute airspace disease. Electronically Signed   By: Sharlet Salina M.D.   On: 12/27/2022 17:47   VAS [place] LOWER EXTREMITY VENOUS REFLUX Result Date: 12/08/2022  Lower Venous Reflux Study Patient Name:  JOHNATHON MOREJON [facility]  Date of Exam:    12/08/2022 Medical Rec #: [id number]           Accession #:    [id number] Date of Birth: 06/21/1955           Patient Gender: F Patient Age:   15 years Exam Location:  [facility] Procedure:      VAS [place] LOWER EXTREMITY VENOUS REFLUX Referring Phys: Lelon Mast RHYNE --------------------------------------------------------------------------------  Indications: Venous stasis.  Performing Technologist: Elita Quick RVT  Examination Guidelines: A complete evaluation includes B-mode imaging, spectral Doppler, color Doppler, and power Doppler as needed of all accessible portions of each vessel. Bilateral testing is considered an integral part of a complete examination. Limited examinations for reoccurring indications may be performed as noted. The reflux portion of the exam is performed with the patient in reverse Trendelenburg. Significant venous reflux is defined as >500 ms in the superficial venous system, and >1 second in the deep venous system.  Venous Reflux Times +--------------+---------+------+-----------+------------+--------+ RIGHT         Reflux NoRefluxReflux TimeDiameter cmsComments  Yes                                  +--------------+---------+------+-----------+------------+--------+ CFV           no                                             +--------------+---------+------+-----------+------------+--------+ FV mid        no                                             +--------------+---------+------+-----------+------------+--------+ Popliteal     no                                             +--------------+---------+------+-----------+------------+--------+ GSV at SFJ              yes    >500 ms      0.51             +--------------+---------+------+-----------+------------+--------+ GSV prox thigh          yes    >500 ms      0.77             +--------------+---------+------+-----------+------------+--------+ GSV  mid thigh           yes    >500 ms      0.48             +--------------+---------+------+-----------+------------+--------+ GSV dist thigh          yes    >500 ms      0.49             +--------------+---------+------+-----------+------------+--------+ GSV at knee             yes    >500 ms      0.41             +--------------+---------+------+-----------+------------+--------+ GSV prox calf           yes    >500 ms      0.31             +--------------+---------+------+-----------+------------+--------+ GSV mid calf  no                            0.23             +--------------+---------+------+-----------+------------+--------+ SSV Pop Fossa no                            0.33             +--------------+---------+------+-----------+------------+--------+ SSV prox calf no                            0.29             +--------------+---------+------+-----------+------------+--------+ SSV mid calf  no                            0.34             +--------------+---------+------+-----------+------------+--------+  AASV O                                              NV       +--------------+---------+------+-----------+------------+--------+   Summary: Right: - No evidence of deep vein thrombosis seen in the right lower extremity, from the common femoral through the popliteal veins. - No evidence of superficial venous thrombosis in the right lower extremity. - No evidence of superficial venous reflux seen in the right short saphenous vein. - Venous reflux is noted in the right sapheno-femoral junction. - Venous reflux is noted in the right greater saphenous vein in the thigh. - Venous reflux is noted in the right greater saphenous vein in the calf.  *See table(s) above for measurements and observations. Electronically signed by Gerarda Fraction on 12/08/2022 at 5:07:35 PM.    Final        The results of significant diagnostics from this hospitalization  (including imaging, microbiology, ancillary and laboratory) are listed below for reference.     Microbiology: No results found for this or any previous visit (from the past 240 hours).   Labs:  CBC: Recent Labs  Lab 12/27/22 1118 12/28/22 0353 12/30/22 0334 12/31/22 0413  WBC 7.2 8.3 7.6 9.6  HGB 11.1* 10.7* 10.9* 10.7*  HCT 37.4 36.2 35.8* 36.4  MCV 88.2 89.2 88.2 89.2  PLT 225 190 237 240   BMP &GFR Recent Labs  Lab 12/27/22 1118 12/28/22 0353 12/29/22 0402 12/30/22 0334 12/31/22 0413  NA 136 139 139 139 135  K 4.3 3.5 3.4* 4.4 4.4  CL 111 111 107 106 105  CO2 16* 18* 21* 26 24  GLUCOSE 97 92 100* 98 106*  BUN 27* 23 18 20 20   CREATININE 1.16* 0.92 0.95 1.23* 1.04*  CALCIUM 9.2 8.9 9.0 9.4 9.3  MG  --  1.9 1.9 2.1 2.1  PHOS  --   --   --  3.0 3.7   Estimated Creatinine Clearance: 52.8 mL/min (A) (by C-G formula based on SCr of 1.04 mg/dL (H)). Liver & Pancreas: Recent Labs  Lab 12/27/22 1118 12/28/22 0353 12/30/22 0334 12/31/22 0413  AST 30 23  --   --   ALT 26 20  --   --   ALKPHOS 255* 197*  --   --   BILITOT 0.5 0.6  --   --   PROT 6.8 6.1*  --   --   ALBUMIN 3.4* 3.0* 3.1* 3.1*   No results for input(s): "LIPASE", "AMYLASE" in the last 168 hours. No results for input(s): "AMMONIA" in the last 168 hours. Diabetic: No results for input(s): "HGBA1C" in the last 72 hours. No results for input(s): "GLUCAP" in the last 168 hours. Cardiac Enzymes: No results for input(s): "CKTOTAL", "CKMB", "CKMBINDEX", "TROPONINI" in the last 168 hours. No results for input(s): "PROBNP" in the last 8760 hours. Coagulation Profile: No results for input(s): "INR", "PROTIME" in the last 168 hours. Thyroid Function Tests: No results for input(s): "TSH", "T4TOTAL", "FREET4", "T3FREE", "THYROIDAB" in the last 72 hours. Lipid Profile: No results for input(s): "CHOL", "HDL", "LDLCALC", "TRIG", "CHOLHDL", "LDLDIRECT" in the last 72 hours. Anemia Panel: No results for  input(s): "VITAMINB12", "FOLATE", "FERRITIN", "TIBC", "IRON", "RETICCTPCT" in the last 72 hours. Urine analysis:    Component Value Date/Time   COLORURINE YELLOW 06/03/2022 0400   APPEARANCEUR HAZY (A) 06/03/2022 0400  LABSPEC 1.019 06/03/2022 0400   PHURINE 5.0 06/03/2022 0400   GLUCOSEU NEGATIVE 06/03/2022 0400   HGBUR SMALL (A) 06/03/2022 0400   BILIRUBINUR NEGATIVE 06/03/2022 0400   KETONESUR NEGATIVE 06/03/2022 0400   PROTEINUR 30 (A) 06/03/2022 0400   NITRITE NEGATIVE 06/03/2022 0400   LEUKOCYTESUR MODERATE (A) 06/03/2022 0400   Sepsis Labs: Invalid input(s): "PROCALCITONIN", "LACTICIDVEN"   SIGNED:  Almon Hercules, MD  [facility] 12/31/2022, 6:32 PM

## 2023-01-04 ENCOUNTER — Other Ambulatory Visit: Payer: Self-pay | Admitting: Student

## 2023-01-04 ENCOUNTER — Other Ambulatory Visit (HOSPITAL_COMMUNITY): Payer: Self-pay

## 2023-01-04 ENCOUNTER — Other Ambulatory Visit: Payer: Self-pay | Admitting: Family Medicine

## 2023-01-27 ENCOUNTER — Ambulatory Visit: Payer: Self-pay | Admitting: Cardiology

## 2023-02-14 ENCOUNTER — Other Ambulatory Visit: Payer: Self-pay

## 2023-02-14 ENCOUNTER — Emergency Department (HOSPITAL_COMMUNITY)
Admission: EM | Admit: 2023-02-14 | Discharge: 2023-02-14 | Disposition: A | Discharge status: Home / Self Care | Payer: HMO | Attending: Emergency Medicine | Admitting: Emergency Medicine

## 2023-02-14 ENCOUNTER — Emergency Department (HOSPITAL_COMMUNITY): Payer: HMO

## 2023-02-14 DIAGNOSIS — I110 Hypertensive heart disease with heart failure: Secondary | ICD-10-CM | POA: Insufficient documentation

## 2023-02-14 DIAGNOSIS — Z20822 Contact with and (suspected) exposure to covid-19: Secondary | ICD-10-CM | POA: Insufficient documentation

## 2023-02-14 DIAGNOSIS — Z7901 Long term (current) use of anticoagulants: Secondary | ICD-10-CM | POA: Diagnosis not present

## 2023-02-14 DIAGNOSIS — R0609 Other forms of dyspnea: Secondary | ICD-10-CM | POA: Diagnosis not present

## 2023-02-14 DIAGNOSIS — I509 Heart failure, unspecified: Secondary | ICD-10-CM | POA: Diagnosis not present

## 2023-02-14 DIAGNOSIS — R0602 Shortness of breath: Secondary | ICD-10-CM | POA: Diagnosis present

## 2023-02-14 LAB — CBC
HCT: 36.1 % (ref 36.0–46.0)
Hemoglobin: 11.2 g/dL — ABNORMAL LOW (ref 12.0–15.0)
MCH: 26.8 pg (ref 26.0–34.0)
MCHC: 31 g/dL (ref 30.0–36.0)
MCV: 86.4 fL (ref 80.0–100.0)
Platelets: 224 10*3/uL (ref 150–400)
RBC: 4.18 MIL/uL (ref 3.87–5.11)
RDW: 21 % — ABNORMAL HIGH (ref 11.5–15.5)
WBC: 6.8 10*3/uL (ref 4.0–10.5)
nRBC: 0 % (ref 0.0–0.2)

## 2023-02-14 LAB — BRAIN NATRIURETIC PEPTIDE: B Natriuretic Peptide: 533.1 pg/mL — ABNORMAL HIGH (ref 0.0–100.0)

## 2023-02-14 LAB — BASIC METABOLIC PANEL
Anion gap: 9 (ref 5–15)
BUN: 22 mg/dL (ref 8–23)
CO2: 22 mmol/L (ref 22–32)
Calcium: 9.9 mg/dL (ref 8.9–10.3)
Chloride: 110 mmol/L (ref 98–111)
Creatinine, Ser: 1.02 mg/dL — ABNORMAL HIGH (ref 0.44–1.00)
GFR, Estimated: 60 mL/min — ABNORMAL LOW (ref >60)
Glucose, Bld: 92 mg/dL (ref 70–99)
Potassium: 3.7 mmol/L (ref 3.5–5.1)
Sodium: 141 mmol/L (ref 135–145)

## 2023-02-14 LAB — RESP PANEL BY RT-PCR (RSV, FLU A&B, COVID)  RVPGX2
Influenza A by PCR: NEGATIVE
Influenza B by PCR: NEGATIVE
Resp Syncytial Virus by PCR: NEGATIVE
SARS Coronavirus 2 by RT PCR: NEGATIVE

## 2023-02-14 LAB — TROPONIN I (HIGH SENSITIVITY)
Troponin I (High Sensitivity): 16 ng/L (ref <18)
Troponin I (High Sensitivity): 12 ng/L (ref <18)

## 2023-02-14 MED ORDER — METOPROLOL TARTRATE 25 MG PO TABS
25.0000 mg | ORAL_TABLET | Freq: Once | ORAL | Status: AC
  Administered 2023-02-14: 25 mg via ORAL
  Filled 2023-02-14: qty 1

## 2023-02-14 MED ORDER — FUROSEMIDE 10 MG/ML IJ SOLN
40.0000 mg | Freq: Once | INTRAMUSCULAR | Status: AC
Start: 2023-02-14 — End: 2023-02-14
  Administered 2023-02-14: 40 mg via INTRAVENOUS
  Filled 2023-02-14: qty 4

## 2023-02-14 NOTE — ED Triage Notes (Signed)
BIBA c/o sob with exertion x3 days.  Hx of A-fib and CHF.  Denies increased swelling to lower extremities.  Pt reports currently on eliquis

## 2023-02-14 NOTE — ED Provider Notes (Signed)
Emergency Department Provider Note   I have reviewed the triage vital signs and the nursing notes.   HISTORY  Chief Complaint Shortness of Breath   HPI Christine Jacobson is a 68 y.o. female with past history of congestive heart failure, hypertension, persistent A-fib on anticoagulation presents emergency department with exertional shortness of breath and some tightness in the chest.  No rest symptoms.  She notes some swelling in her legs which is fairly common for her but not particularly worse than normal.  She has been taking her Lasix and urinating frequently with no improvement in her respiratory symptoms.  No fevers or chills.  No cough. Patient is not on O2 at home.   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 Respiratory: Denies shortness of breath. Gastrointestinal: No abdominal pain.  No nausea, no vomiting.   Musculoskeletal: Negative for back pain. Skin: Negative for rash. Neurological: Negative for headaches.  ____________________________________________   PHYSICAL EXAM:  VITAL SIGNS: ED Triage Vitals  Encounter Vitals Group     BP 02/14/23 1140 (!) 161/83      Pulse Rate 02/14/23 1140 74     Resp 02/14/23 1140 18     Temp 02/14/23 1140 97.7 F (36.5 C)     Temp Source 02/14/23 1140 Oral     SpO2 02/14/23 1140 98 %   Constitutional: Alert and oriented. Well appearing and in no acute distress. Eyes: Conjunctivae are normal.  Head: Atraumatic. Nose: No congestion/rhinnorhea. Mouth/Throat: Mucous membranes are moist.   Neck: No stridor.  Cardiovascular: A fib. Good peripheral circulation. Grossly normal heart sounds.   Respiratory: Normal respiratory effort.  No retractions. Lungs crackles at the bases.  Gastrointestinal: Soft and nontender. No distention.  Musculoskeletal: No lower extremity tenderness with swelling in both legs, wraps in place. No gross deformities of extremities. Neurologic:  Normal speech and language. No gross focal neurologic deficits are appreciated.  Skin:  Skin is warm, dry and intact. No rash noted.  ____________________________________________   LABS (all labs ordered are listed, but only abnormal results are displayed)  Labs Reviewed  BASIC METABOLIC PANEL - Abnormal; Notable for the following components:      Result Value   Creatinine, Ser 1.02 (*)    GFR, Estimated 60 (*)    All other components within normal limits  CBC - Abnormal; Notable for the following components:   Hemoglobin 11.2 (*)    RDW 21.0 (*)    All other components within normal limits  BRAIN NATRIURETIC PEPTIDE - Abnormal; Notable for the following components:   B Natriuretic Peptide 533.1 (*)  All other components within normal limits  RESP PANEL BY RT-PCR (RSV, FLU A&B, COVID)  RVPGX2  TROPONIN I (HIGH SENSITIVITY)  TROPONIN I (HIGH SENSITIVITY)   ____________________________________________  EKG   EKG Interpretation Date/Time:  Tuesday February 14 2023 11:47:03 EST Ventricular Rate:  83 PR Interval:    QRS Duration:  99 QT Interval:  421 QTC Calculation: 495 R Axis:   6  Text Interpretation: Atrial fibrillation LVH  with secondary repolarization abnormality Borderline prolonged QT interval Confirmed by Alona Bene [phone]) on 02/14/2023 3:35:43 PM        ____________________________________________  RADIOLOGY  DG Chest 2 View Result Date: 02/14/2023 CLINICAL DATA:  Shortness of breath, chest pain. EXAM: CHEST - 2 VIEW COMPARISON:  December 27, 2022. FINDINGS: Mild cardiomegaly. Minimal left lingular subsegmental atelectasis or scarring. Right lung is clear. The heart size and mediastinal contours are within normal limits. Both lungs are clear. The visualized skeletal structures are unremarkable. IMPRESSION: Minimal left lingular subsegmental atelectasis or scarring. Electronically Signed   By: Lupita Raider M.D.   On: 02/14/2023 12:56    ____________________________________________   PROCEDURES  Procedure(s) performed:   Procedures  None  ____________________________________________   INITIAL IMPRESSION / ASSESSMENT AND PLAN / ED COURSE  Pertinent labs & imaging results that were available during my care of the patient were reviewed by me and considered in my medical decision making (see chart for details).   This patient is Presenting for Evaluation of SOB/CP, which does require a range of treatment options, and is a complaint that involves a high risk of morbidity and mortality.  The Differential Diagnoses includes but is not exclusive to acute coronary syndrome, aortic dissection, pulmonary embolism, cardiac tamponade, community-acquired pneumonia, pericarditis, musculoskeletal chest wall pain, etc.   Critical Interventions-    Medications  furosemide (LASIX) injection 40 mg (40 mg Intravenous Given 02/14/23 1604)    Reassessment after intervention:  ambulatory without increased SOB or hypoxemia. O2 100 % on RA with ambulation to the bathroom.   Clinical Laboratory Tests Ordered, included BNP elevated to 533.  No AKI.  Troponin normal.  Radiologic Tests Ordered, included CXR. I  independently interpreted the images and agree with radiology interpretation.   Cardiac Monitor Tracing which shows A fib (rate controlled).    Social Determinants of Health Risk patient is not an active smoker.   Medical Decision Making: Summary:  Presents emergency department with persistent A-fib and clinical appearance of mild fluid overload.  She is in no respiratory distress on my assessment at rest.  She has faint crackles at the bases bilaterally.  No hypoxemia.  She does have mild elevation in her BNP from triage.  Plan for IV Lasix here, respiratory swab, delta troponin, reassess.  Reevaluation with update and discussion with him.  Her second troponin is normal and viral panel negative.  She has been ambulatory to the bathroom without more assistance than normal with O2 sat maintaining at 100%.  Plan to increase her Lasix to twice daily dosing for the next 2 days and will have her call her PCP tomorrow for follow-up in the next week.  Discussed strict ED return precautions.  Considered admission workup in the ED is overall reassuring.  Exceedingly low suspicion for PE as the patient is anticoagulated and compliant with her medicines.  Plan for increased Lasix dosing at home and PCP follow-up.  Patient's presentation is most consistent with acute presentation with potential threat to life or bodily function.   Disposition: discharge  ____________________________________________  FINAL CLINICAL IMPRESSION(S) / ED DIAGNOSES  Final diagnoses:  DOE (dyspnea on exertion)    Note:  This document was prepared using Dragon voice recognition software and may include unintentional dictation errors.  Alona Bene, MD, [facility] Emergency Medicine    Dionte Blaustein, Arlyss Repress, MD 02/14/23 [phone]

## 2023-02-14 NOTE — Discharge Instructions (Signed)
You were seen in the emergency room today with trouble breathing.  Please continue your home medications as prescribed.  For your Lasix (Furosemide) I would like for you to take this twice a day for the next 2 days and then return to your once a day prescribed dose. Please call your PCP tomorrow for a follow up, ideally in the next 3-6 days.   If you develop any new or suddenly worsening symptoms please return to the emergency department for reevaluation.

## 2023-02-14 NOTE — ED Provider Triage Note (Signed)
Emergency Medicine Provider Triage Evaluation Note  Christine Jacobson , a 68 y.o. female  was evaluated in triage.  Pt complains of exertional CP starting after doing laundry yesterday, short of breath at rest, worse walking.  Review of Systems  Positive: CP, SOB Negative: Focal weakness  Physical Exam  BP (!) 161/83 (BP Location: Left Arm)   Pulse 74   Temp 97.7 F (36.5 C) (Oral)   Resp 18   SpO2 98%  Gen:   Awake, no distress   Resp:  Normal effort  MSK:   Moves extremities without difficulty  Other:    Medical Decision Making  Medically screening exam initiated at 11:52 AM.  Appropriate orders placed.  Christine Jacobson was informed that the remainder of the evaluation will be completed by another provider, this initial triage assessment does not replace that evaluation, and the importance of remaining in the ED until their evaluation is complete.  Afib on Eliquis, HTN, HLD, CHF. Appears fluid overloaded on exam, rales on lung sounds. Patient reports she has been taking her medications and not feeling better.    Christine Knudsen, PA-C 02/14/23 1154

## 2023-02-14 NOTE — ED Notes (Signed)
Went to discharge pt and pt co new onset fast heart rate and chest pain. Got new ECG per MD and gave metoprolol per order. Pt HR came down to 96 BPM. EDP offered to keep pt. Pt said, "I still want to go my ride is here."

## 2023-02-14 NOTE — ED Notes (Signed)
NT ambulated pt in the hallway and O2 went up to 100% on RA

## 2023-02-20 ENCOUNTER — Ambulatory Visit: Payer: Self-pay | Admitting: Internal Medicine

## 2023-02-20 ENCOUNTER — Ambulatory Visit: Payer: Self-pay | Admitting: Genetic Counselor

## 2023-02-20 ENCOUNTER — Telehealth: Payer: Self-pay | Admitting: Cardiology

## 2023-02-20 NOTE — Progress Notes (Deleted)
 Cardiology Office Note:  .    Date:  02/20/2023  ID:  Rosalva Ferron, DOB 11-10-1955, MRN [id number] PCP: Karenann Cai, NP  [facility] Providers Cardiologist:  Thomasene Ripple, DO { Click to update primary MD,subspecialty MD or APP then REFRESH:1}    CC: *** Consulted for the evaluation of oHCM at the behest of Dr. Servando Salina   History of Present Illness: .    Christine Jacobson is a 69 y.o. female oHCM (Peak gradient 61 mm HG, mod-severe MR, moderate TR, septal thickness 21 mm, no CMR) with lpersistent AF. HTN, and Grade II PH.   @Discussed  the use of AI scribe software for clinical note transcription with the patient, who gave verbal consent to proceed.  History of Present Illness             Relevant histories: .  Social *** ROS: As per HPI.   Studies Reviewed: .   Cardiac Studies & Procedures      ECHOCARDIOGRAM  ECHOCARDIOGRAM LIMITED 12/29/2022  Narrative ECHOCARDIOGRAM LIMITED REPORT    Patient Name:   Christine Jacobson Marker Date of Exam: 12/29/2022 Medical Rec #:  [id number]          Height:       64.0 in Accession #:    [id number]         Weight:       183.0 lb Date of Birth:  1955-09-24          BSA:          1.884 m Patient Age:    67 years           BP:           115/82 mmHg Patient Gender: F                  HR:           76 bpm. Exam Location:  Inpatient  Procedure: Limited Echo, Cardiac Doppler, Color Doppler and Intracardiac Opacification Agent  Indications:    HOCM I42.1  History:        Patient has prior history of Echocardiogram examinations, most recent 09/20/2022. Risk Factors:Hypertension and Dyslipidemia.  Sonographer:    Harriette Bouillon RDCS Referring Phys: [id number] CHRISTOPHER L SCHUMANN  IMPRESSIONS   1. Left ventricular ejection fraction, by estimation, is 65 to 70%. The left ventricle has normal function. The left ventricle has no regional wall motion abnormalities. There is severe concentric left ventricular  hypertrophy. 2. The mitral valve is degenerative. Moderate to severe mitral valve regurgitation. No evidence of mitral stenosis. Severe mitral annular calcification. 3. The aortic valve is tricuspid. There is mild calcification of the aortic valve. There is mild thickening of the aortic valve. Aortic valve regurgitation is not visualized. Aortic valve sclerosis is present, with no evidence of aortic valve stenosis.  Comparison(s): No significant change from prior study. HOCM with SAM of mitral valve, mod-severe MR, and peak LVOT gradient 61 mmHg.  FINDINGS Left Ventricle: Systolic anterior motion of the mitral valve. LVOT peak gradient 61 mmHg. Left ventricular ejection fraction, by estimation, is 65 to 70%. The left ventricle has normal function. The left ventricle has no regional wall motion abnormalities. Definity contrast agent was given IV to delineate the left ventricular endocardial borders. There is severe concentric left ventricular hypertrophy.  Mitral Valve: The mitral valve is degenerative in appearance. Severe mitral annular calcification. Moderate to severe mitral valve regurgitation, with eccentric posteriorly directed jet. No evidence of  mitral valve stenosis.  Aortic Valve: The aortic valve is tricuspid. There is mild calcification of the aortic valve. There is mild thickening of the aortic valve. Aortic valve regurgitation is not visualized. Aortic valve sclerosis is present, with no evidence of aortic valve stenosis.  LEFT VENTRICLE PLAX 2D LVIDd:         4.00 cm LVIDs:         2.10 cm LV PW:         2.04 cm LV IVS:        2.37 cm LVOT diam:     1.90 cm LVOT Area:     2.84 cm   LEFT ATRIUM         Index LA diam:    5.00 cm 2.65 cm/m  AORTA Ao Root diam: 3.00 cm   SHUNTS Systemic Diam: 1.90 cm  Jodelle Red MD Electronically signed by Jodelle Red MD Signature Date/Time: 12/29/2022/6:41:20 PM    Final  TEE  ECHO TEE  09/20/2022  Narrative TRANSESOPHOGEAL ECHO REPORT    Patient Name:   Christine Jacobson Date of Exam: 09/20/2022 Medical Rec #:  [id number]     Height:       64.0 in Accession #:    [id number]    Weight:       195.3 lb Date of Birth:  August 19, 1955     BSA:          1.937 m Patient Age:    67 years      BP:           134/107 mmHg Patient Gender: F             HR:           94 bpm. Exam Location:  Inpatient  Procedure: Transesophageal Echo, Cardiac Doppler and Color Doppler  Indications:     Arrhythmia  History:         Patient has prior history of Echocardiogram examinations, most recent 09/16/2022. Hypertrophic Cardiomyopathy, Abnormal ECG; Mitral Valve Disease.  Sonographer:     Darlys Gales Referring Phys:  [id number] Minda Meo HALEY Diagnosing Phys: Arvilla Meres MD  PROCEDURE: After discussion of the risks and benefits of a TEE, an informed consent was obtained from the patient. The transesophogeal probe was passed without difficulty through the esophogus of the patient. Sedation performed by different physician. The patient was monitored while under deep sedation. Anesthestetic sedation was provided intravenously by Anesthesiology: 260mg  of Propofol. The patient developed no complications during the procedure. A successful direct current cardioversion was performed at 200 joules with 1 attempt.  IMPRESSIONS   1. Left ventricular ejection fraction, by estimation, is 70 to 75%. The left ventricle has hyperdynamic function. There is severe asymmetric left ventricular hypertrophy of the septal segment. 2. Right ventricular systolic function is normal. The right ventricular size is normal. 3. Left atrial size was Massivel dialted. No left atrial/left atrial appendage thrombus was detected. 4. Right atrial size was moderately dilated. 5. The mitral valve is normal in structure. Mild mitral valve regurgitation. Moderate mitral annular calcification. 6. Tricuspid valve regurgitation is  moderate. 7. The aortic valve is tricuspid. There is mild calcification of the aortic valve. Aortic valve regurgitation is trivial.  FINDINGS Left Ventricle: Left ventricular ejection fraction, by estimation, is 70 to 75%. The left ventricle has hyperdynamic function. The left ventricular internal cavity size was normal in size. There is severe asymmetric left ventricular hypertrophy of the septal segment.  Right Ventricle: The right ventricular  size is normal. No increase in right ventricular wall thickness. Right ventricular systolic function is normal.  Left Atrium: Left atrial size was Massivel dialted. No left atrial/left atrial appendage thrombus was detected.  Right Atrium: Right atrial size was moderately dilated.  Pericardium: There is no evidence of pericardial effusion.  Mitral Valve: The mitral valve is normal in structure. Moderate mitral annular calcification. Mild mitral valve regurgitation.  Tricuspid Valve: The tricuspid valve is normal in structure. Tricuspid valve regurgitation is moderate.  Aortic Valve: The aortic valve is tricuspid. There is mild calcification of the aortic valve. Aortic valve regurgitation is trivial.  Pulmonic Valve: The pulmonic valve was normal in structure. Pulmonic valve regurgitation is trivial.  Aorta: The aortic root is normal in size and structure.  IAS/Shunts: No atrial level shunt detected by color flow Doppler.  Arvilla Meres MD Electronically signed by Arvilla Meres MD Signature Date/Time: 09/21/2022/1:29:38 PM    Final            Results           *** Risk Assessment/Calculations:    {Does this patient have ATRIAL FIBRILLATION?:[phone]}  {This patient may be at risk for Amyloid. She has one or more dx on the problem list or PMH from the following list - Abnormal EKG, CHF, Aortic Stenosis, Proteinuria, LVH, Carpal Tunnel Syndrome, Biceps Tendon Rupture, Syncope. See list below or review PMH.  Diagnoses From  Problem List           Noted     Acute exacerbation of CHF (congestive heart failure) (HCC) 09/15/2022     Acute on chronic diastolic (congestive) heart failure (HCC) 12/27/2022     Atrial fibrillation by electrocardiogram (HCC) 09/15/2022     Chronic diastolic heart failure (HCC) 12/15/2016     Syncope 12/05/2013    Click HERE to open Cardiac Amyloid Screening SmartSet to order screening OR Click HERE to defer testing for 1 year or permanently :1}    Physical Exam:    VS:  There were no vitals taken for this visit.   Wt Readings from Last 3 Encounters:  12/31/22 180 lb 1.9 oz (81.7 kg)  12/08/22 184 lb (83.5 kg)  11/24/22 183 lb 9.6 oz (83.3 kg)    Gen: *** distress, *** obese/well nourished/malnourished   Neck: No JVD, *** carotid bruit Ears: *** Frank Sign Cardiac: No Rubs or Gallops, *** Murmur, ***cardia, *** radial pulses Respiratory: Clear to auscultation bilaterally, *** effort, ***  respiratory rate GI: Soft, nontender, non-distended *** MS: No *** edema; *** moves all extremities Integument: Skin feels *** Neuro:  At time of evaluation, alert and oriented to person/place/time/situation *** Psych: Normal affect, patient feels ***   ASSESSMENT AND PLAN: .    *** An EKG was ordered for *** and shows ***  Surgery > CMI  Hypertrophic Cardiomyopathy*** vs *** - *** Variant - peak gradient *** on ***  - ***with/without MR, Apical Aneurysm, other considerations*** - suspicion of Fabry's/Danon/Noonan's or other mimics of HCM: *** - Gene variant: *** - Indexed assessment: [email or web address].com/ - NYHA ***  - Non HCM Contributors to disease/status *** ***HTN- - (While note showing secondary prevenitve remodeling INHERIT trial of losartan showed safety in patients with HCM).   Family history ***, Discussed family screening  *** Family 13-22, Family 22+, SCD in family, HCM in family - Family history and HTN: Will preferential start on valsartan ***  SCD   Assessment - Exercise testing normal for *** rhythm and *** peak gradient  and *** no change in blood pressure or syncope on exercise testing) - Echo from *** notable for *** - CMR from *** notable for *** - *** 2 year assessment for VT on rhythm monitor *** - SCD risk estimated to be *** at 5 years SDM: we have discussed ***  Atrial fibrillation Assessment (***) - Atrial arrhythmia management: ***  Medication symptom plan - *** CCB, BB - Descalation of *** - Congestions/diuretics: *** - disopyramide *** - mavacamten consideration*** - clinical trial consideration: ***  ASA - age ***, MR ***, Redundant septal perforator anatomy - Alcohol septal ablation results in reduction of one or more NYHA class in approximately 90 percent of patients who undergo the procedure. The risks of alcohol septal ablation include a mortality risk of approximately 1 percent, a 10 percent risk of permanent pacemaker placement, and a 10 percent chance for the need for a repeat procedure due to suboptimal gradient reduction after the initial procedure. In patients who are not candidates for surgical myectomy or in patients who do not want to undergo cardiac surgery, alcohol septal ablation is the treatment of choice. Alcohol septal ablation is less invasive than surgical myectomy and its efficacy in reducing symptoms is similar to surgical myectomy (90 versus 96 percent have a reduction in symptoms with alcohol septal ablation and surgical myectomy, respectively).  Septal myectomy evaluation - other surgical indications: ***  Procedural/Surgical Clinical trial evaluation: ***  Genetic Therapy discussion: ***  [email or web address]   Riley Lam, MD FASE [facility]  [address], [address] [place], [place] [address] [phone]  7:58 AM

## 2023-02-20 NOTE — Telephone Encounter (Signed)
Called and spoke to patient. Verified name and DOB. Patient called stating she had 2 appointment scheduled for today and she over slept missing both appointment. She states she is overwhelmed with having so many appts and getting a lot of bills from [facility] that she can't afford. Patient very tearful. She asked for a return call from Dr Servando Salina to discuss if she should continue seeing these different doctors and if she is ever going to get better. She stated this is taking a toll on her mentally.

## 2023-02-20 NOTE — Telephone Encounter (Signed)
Patient is requesting to speak with Dr. Mallory Shirk nurse. She declined going into detail with me, but she's not happy with what's going on and prefers DO or RN directly.

## 2023-03-01 ENCOUNTER — Telehealth: Payer: Self-pay | Admitting: Cardiology

## 2023-03-01 NOTE — Telephone Encounter (Signed)
Pt called in asking to speak with Dr. Servando Salina or nurse about why she was referred to HOCM clinic.

## 2023-03-02 NOTE — Telephone Encounter (Signed)
Left message for patient to return the call.

## 2023-03-06 ENCOUNTER — Other Ambulatory Visit: Payer: Self-pay

## 2023-03-06 ENCOUNTER — Emergency Department (HOSPITAL_COMMUNITY)
Admission: EM | Admit: 2023-03-06 | Discharge: 2023-03-06 | Disposition: A | Discharge status: Home / Self Care | Payer: HMO | Attending: Emergency Medicine | Admitting: Emergency Medicine

## 2023-03-06 ENCOUNTER — Encounter (HOSPITAL_COMMUNITY): Payer: Self-pay

## 2023-03-06 DIAGNOSIS — E119 Type 2 diabetes mellitus without complications: Secondary | ICD-10-CM | POA: Insufficient documentation

## 2023-03-06 DIAGNOSIS — Z79899 Other long term (current) drug therapy: Secondary | ICD-10-CM | POA: Diagnosis not present

## 2023-03-06 DIAGNOSIS — I110 Hypertensive heart disease with heart failure: Secondary | ICD-10-CM | POA: Insufficient documentation

## 2023-03-06 DIAGNOSIS — D72829 Elevated white blood cell count, unspecified: Secondary | ICD-10-CM | POA: Diagnosis not present

## 2023-03-06 DIAGNOSIS — M109 Gout, unspecified: Secondary | ICD-10-CM

## 2023-03-06 DIAGNOSIS — M7989 Other specified soft tissue disorders: Secondary | ICD-10-CM | POA: Diagnosis present

## 2023-03-06 DIAGNOSIS — Z7901 Long term (current) use of anticoagulants: Secondary | ICD-10-CM | POA: Diagnosis not present

## 2023-03-06 DIAGNOSIS — L03116 Cellulitis of left lower limb: Secondary | ICD-10-CM | POA: Insufficient documentation

## 2023-03-06 DIAGNOSIS — I509 Heart failure, unspecified: Secondary | ICD-10-CM | POA: Insufficient documentation

## 2023-03-06 DIAGNOSIS — I4891 Unspecified atrial fibrillation: Secondary | ICD-10-CM | POA: Diagnosis not present

## 2023-03-06 LAB — CBC
HCT: 39.3 % (ref 36.0–46.0)
Hemoglobin: 12.1 g/dL (ref 12.0–15.0)
MCH: 26.7 pg (ref 26.0–34.0)
MCHC: 30.8 g/dL (ref 30.0–36.0)
MCV: 86.8 fL (ref 80.0–100.0)
Platelets: 346 10*3/uL (ref 150–400)
RBC: 4.53 MIL/uL (ref 3.87–5.11)
RDW: 18.2 % — ABNORMAL HIGH (ref 11.5–15.5)
WBC: 12.8 10*3/uL — ABNORMAL HIGH (ref 4.0–10.5)
nRBC: 0 % (ref 0.0–0.2)

## 2023-03-06 LAB — BASIC METABOLIC PANEL
Anion gap: 11 (ref 5–15)
BUN: 25 mg/dL — ABNORMAL HIGH (ref 8–23)
CO2: 23 mmol/L (ref 22–32)
Calcium: 10.2 mg/dL (ref 8.9–10.3)
Chloride: 103 mmol/L (ref 98–111)
Creatinine, Ser: 1.22 mg/dL — ABNORMAL HIGH (ref 0.44–1.00)
GFR, Estimated: 48 mL/min — ABNORMAL LOW (ref >60)
Glucose, Bld: 93 mg/dL (ref 70–99)
Potassium: 3.6 mmol/L (ref 3.5–5.1)
Sodium: 137 mmol/L (ref 135–145)

## 2023-03-06 LAB — CBG MONITORING, ED: Glucose-Capillary: 80 mg/dL (ref 70–99)

## 2023-03-06 MED ORDER — OXYCODONE HCL 5 MG PO TABS
5.0000 mg | ORAL_TABLET | ORAL | 0 refills | Status: AC | PRN
  Filled 2023-03-06 – 2023-03-07 (×2): qty 12, 2d supply, fill #0

## 2023-03-06 MED ORDER — OXYCODONE HCL 5 MG PO TABS
5.0000 mg | ORAL_TABLET | Freq: Once | ORAL | Status: AC
  Administered 2023-03-06: 5 mg via ORAL
  Filled 2023-03-06: qty 1

## 2023-03-06 MED ORDER — PREDNISONE 10 MG PO TABS
ORAL_TABLET | ORAL | 0 refills | Status: DC
  Filled 2023-03-06: qty 15, 3d supply, fill #0
  Filled 2023-03-07: qty 15, 10d supply, fill #0

## 2023-03-06 MED ORDER — CEPHALEXIN 250 MG PO CAPS
250.0000 mg | ORAL_CAPSULE | Freq: Four times a day (QID) | ORAL | 0 refills | Status: DC
  Filled 2023-03-06 – 2023-03-07 (×2): qty 28, 7d supply, fill #0

## 2023-03-06 MED ORDER — PREDNISONE 20 MG PO TABS
40.0000 mg | ORAL_TABLET | Freq: Once | ORAL | Status: AC
  Administered 2023-03-06: 40 mg via ORAL
  Filled 2023-03-06: qty 2

## 2023-03-06 MED ORDER — CEPHALEXIN 250 MG PO CAPS
250.0000 mg | ORAL_CAPSULE | Freq: Once | ORAL | Status: AC
  Administered 2023-03-06: 250 mg via ORAL
  Filled 2023-03-06: qty 1

## 2023-03-06 NOTE — ED Triage Notes (Signed)
Biba for left leg pain and left arm swelling and numbness. Hx of CHF. Denies cp, shob or any other symptoms

## 2023-03-06 NOTE — ED Notes (Signed)
Patient given [place] sandwich with ginger ale.

## 2023-03-06 NOTE — Discharge Instructions (Signed)
Thank you for coming to [facility] Emergency Department. You were seen for swelling of the left hand and pain in the left leg. We believe you have gout in your left hand and cellulitis in your left leg. Please take keflex 250 mg four times per day for 7 days and a prednisone taper for 10 days. You can take tylenol for pain control.    Please follow up with your primary care provider within 1 week.   Do not hesitate to return to the ED or call 911 if you experience: -Worsening symptoms -Lightheadedness, passing out -Fevers/chills -Anything else that concerns you

## 2023-03-06 NOTE — ED Provider Notes (Signed)
 [place] [facility]MENT AT [facility] Provider Note   CSN: [id number] Arrival date & time: 03/06/23  1432     History  Chief Complaint  Patient presents with   Numbness   Leg Pain   Arm Swelling    Christine Jacobson is a 68 y.o. female with  Hx of CHF, Afib, HOCM, T2DM, and pulm HTN who is Biba for pain and swelling to the joints of her left hand over the last couple of days. She denies any numbness or tingling anywhere. No other swelling to the left arm, just the joints of the left hand. No h/o similar. No h/o gout. She also reports some increased pain in her left leg.   Denies CP, abd pain, SOB, cough, flu-like symptoms. Denies any recent illnesses. Takes eliquis for atrial fibrillation. Also has chronic LEE for which she has her legs wrapped twice per week to help the swelling go down. Notes increased pain in the left lower extremity for the last couple of days.   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   2D ECHO - EF >55%, normal   HTN (hypertension)    Hyperprolactinemia (HCC)    dx in her 18, took meds, self d/c a while back   Liver function test abnormality    normal when repeated   Mild hyperlipidemia    Obesity    Palpitations    negative stress echo in November 2012 with normal LV function; mild LVH, proximal septal thickening with narrow LVOT and mild gradient; mild MR and TR; Cardionet showed PACs in November 2012   Pyoderma gangrenosa    Shortness of breath 07/11/2011   MET TEST       Home Medications Prior to Admission medications   Medication Sig Start Date End Date Taking? Authorizing Provider  apixaban (ELIQUIS) 5 MG TABS tablet Take 1 tablet (5 mg total) by mouth 2 (two) times daily. 02/23/22   Tobb, Kardie, DO   ascorbic acid (VITAMIN C) 500 MG tablet Take 1 tablet (500 mg total) by mouth daily for 30 days 06/10/22   Leroy Sea, MD  atorvastatin (LIPITOR) 80 MG tablet Take 1 tablet (80 mg total) by mouth daily. 09/22/22   Alfredo Martinez, MD  empagliflozin (JARDIANCE) 10 MG TABS tablet Take 1 tablet (10 mg total) by mouth daily. 09/21/22   Everhart, Kirstie, DO  escitalopram (LEXAPRO) 5 MG tablet Take 1 tablet (5 mg total) by mouth daily. Patient taking differently: Take 5 mg by mouth at bedtime. 09/22/22   Alfredo Martinez, MD  furosemide (LASIX) 40 MG tablet Take 1 tablet (40 mg total) by mouth daily as needed. For weight gains more than 3 pounds in 1 day or 5 pounds in 1 week. Stop torsemide 12/31/22 12/31/23  Almon Hercules, MD  hydrOXYzine (ATARAX) 10 MG tablet Take 1 tablet (10 mg total) by mouth at bedtime. Patient taking differently: Take 10 mg by mouth as needed for anxiety (sleep). 09/21/22   Alfredo Martinez, MD  metoprolol succinate (TOPROL-XL) 25 MG 24 hr tablet Take 1 tablet (25 mg total) by mouth daily. 09/22/22   Alfredo Martinez, MD  pantoprazole (PROTONIX) 40 MG tablet Take 1 tablet (40 mg total) by mouth daily. Patient not taking: Reported on 12/28/2022 08/19/22  Jerald Kief, MD  Zinc Sulfate 220 (50 Zn) MG TABS Take 1 tablet (220 mg total) by mouth daily for 30 days 06/10/22   Leroy Sea, MD      Allergies    Pork-derived products    Review of Systems   Review of Systems A 10 point review of systems was performed and is negative unless otherwise reported in HPI.  Physical Exam Updated Vital Signs BP (!) 133/92   Pulse 91   Temp 98.8 F (37.1 C) (Oral)   Resp 18   Ht 5\' 4"  (1.626 m)   Wt 87.4 kg   SpO2 93%   BMI 33.07 kg/m  Physical Exam General: Normal appearing female, lying in bed.  HEENT: PERRLA, Sclera anicteric, MMM, trachea midline.  Cardiology: RRR, no murmurs/rubs/gallops. BL radial and DP pulses equal bilaterally.  Resp: Normal respiratory rate and effort.  CTAB, no wheezes, rhonchi, crackles.  Abd: Soft, non-tender, non-distended. No rebound tenderness or guarding.  GU: Deferred. MSK:  L hand: See photo below. Erythema and swelling to left 3-5th PIPs. Tophi visible in joints. TTP. ROM of PIPs limited d/t pain. No pain with passive extension. No swelling along. Flexor tendon sheath. No trauma/wounds. Otherwise no bony deformities. Intact distal cap refill. Compartments soft.  R hand wnl.  L leg: Chronic venous stasis changes with 2+ pedal edema. Isolated area of erythema on anterior lower leg w/ mild TTP. No bullae, crepitus palpated. No areas of fluctuance. Intact DP/PT pulse. Intact distal cap refill. No joint effusions or erythema. FROM BL LEs.  TTP. No cyanosis or clubbing. Skin: warm, dry. No rashes or lesions. Neuro: A&Ox4, CNs II-XII grossly intact. MAEs. Sensation grossly intact.  Psych: Normal mood and affect.        ED Results / Procedures / Treatments   Labs (all labs ordered are listed, but only abnormal results are displayed) Labs Reviewed  BASIC METABOLIC PANEL - Abnormal; Notable for the following components:      Result Value   BUN 25 (*)    Creatinine, Ser 1.22 (*)    GFR, Estimated 48 (*)    All other components within normal limits  CBC - Abnormal; Notable for the following components:   WBC 12.8 (*)    RDW 18.2 (*)    All other components within normal limits  CBG MONITORING, ED    EKG EKG Interpretation Date/Time:  Monday March 06 2023 21:56:09 EST Ventricular Rate:  91 PR Interval:    QRS Duration:  102 QT Interval:  350 QTC Calculation: 431 R Axis:   0  Text Interpretation: Atrial fibrillation LVH with secondary repolarization abnormality Confirmed by Vivi Barrack [id number]) on 03/06/2023 10:15:04 PM  Radiology No results found.  Procedures Procedures    Medications Ordered in ED Medications  predniSONE (DELTASONE) tablet 40 mg (40 mg Oral Given 03/06/23 2257)  cephALEXin (KEFLEX) capsule 250  mg (250 mg Oral Given 03/06/23 2257)  oxyCODONE (Oxy IR/ROXICODONE) immediate release tablet 5 mg (5 mg Oral Given 03/06/23 2257)    ED Course/ Medical Decision Making/ A&P                          Medical Decision Making Amount and/or Complexity of Data Reviewed Labs: ordered. Decision-making details documented in ED Course.  Risk Prescription drug management.    This patient presents to the ED for concern of LLE pain and swelling/redness to left hand, this involves an extensive number of  treatment options, and is a complaint that carries with it a high risk of complications and morbidity.  I considered the following differential and admission for this acute, potentially life threatening condition. Patient is HDS and well-appearing, no FNDs on exam.   MDM:    Polyarticular arthritis of the left hand. Exam/clinical situation not c/w septic arthritis. Lower c/f reactive arthritis, no recent illnesses. No h/o autoimmune diseases such as SLE or RA. Based on the appearance of the hand it appears like gout, with tophi visible in the joint of the 3rd finger in particular.   Leg is unwrapped, has mild swelling patient states is consistent with normal but she states it is more red than usual. Concern for possible cellulitis. Patient does have h/o celluitis c/b sepsis. Will give her keflex for cellulitis of the LLE as well as steroids. Lower c/f DVT with no circumferential swelling or erythema. Advised to f/u with PCP within 1 week.   Clinical Course as of 03/06/23 2227  Mon Mar 06, 2023  2106 WBC(!): 12.8 +leukocytosis [HN]  2106 Basic metabolic panel(!) unremarkable, no electrolyte derangements [HN]    Clinical Course User Index [HN] Loetta Rough, MD    Labs: I Ordered, and personally interpreted labs.  The pertinent results include:  those listed above  Additional history obtained from chart review.    Reevaluation: After the interventions noted above, I reevaluated the patient  and found that they have :stayed the same  Social Determinants of Health: Lives independently  Disposition:  DC w/ discharge instructions/return precautions. All questions answered to patient's satisfaction.    Co morbidities that complicate the patient evaluation  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   2D ECHO - EF >55%, normal   HTN (hypertension)    Hyperprolactinemia (HCC)    dx in her 18, took meds, self d/c a while back   Liver function test abnormality    normal when repeated   Mild hyperlipidemia    Obesity    Palpitations    negative stress echo in November 2012 with normal LV function; mild LVH, proximal septal thickening with narrow LVOT and mild gradient; mild MR and TR; Cardionet showed PACs in November 2012   Pyoderma gangrenosa    Shortness of breath 07/11/2011   MET TEST     Medicines No orders of the defined types were placed in this encounter.   I have reviewed the patients home medicines and have made adjustments as needed  Problem List / ED Course: Problem List Items Addressed This Visit   None Visit Diagnoses       Gouty arthritis of left hand    -  Primary   Relevant Medications   predniSONE (DELTASONE) tablet 40 mg (Completed)   predniSONE (DELTASONE) 10 MG tablet   oxyCODONE (Oxy IR/ROXICODONE) immediate release tablet 5 mg (Completed)     Left leg cellulitis                       This note was created using dictation software, which may contain spelling or grammatical errors.    Loetta Rough, MD 03/11/23 [phone]

## 2023-03-07 ENCOUNTER — Other Ambulatory Visit: Payer: Self-pay

## 2023-03-07 ENCOUNTER — Other Ambulatory Visit (HOSPITAL_COMMUNITY): Payer: Self-pay

## 2023-03-16 ENCOUNTER — Other Ambulatory Visit (HOSPITAL_COMMUNITY): Payer: Self-pay

## 2023-03-16 MED ORDER — POTASSIUM CHLORIDE ER 20 MEQ PO TBCR
20.0000 meq | EXTENDED_RELEASE_TABLET | ORAL | 11 refills | Status: DC
  Filled 2023-03-16: qty 30, 60d supply, fill #0

## 2023-03-16 MED ORDER — ALLOPURINOL 100 MG PO TABS
100.0000 mg | ORAL_TABLET | Freq: Every morning | ORAL | 3 refills | Status: DC
  Filled 2023-03-16: qty 90, 90d supply, fill #0

## 2023-03-16 MED ORDER — POTASSIUM CHLORIDE ER 20 MEQ PO TBCR
20.0000 meq | EXTENDED_RELEASE_TABLET | Freq: Every day | ORAL | 11 refills | Status: DC
Start: 2023-03-16 — End: 2023-05-13
  Filled 2023-03-16: qty 30, 30d supply, fill #0

## 2023-03-16 MED ORDER — PREDNISONE 10 MG PO TABS
ORAL_TABLET | ORAL | 0 refills | Status: DC
  Filled 2023-03-16: qty 21, 6d supply, fill #0

## 2023-03-16 MED ORDER — ALLOPURINOL 100 MG PO TABS
50.0000 mg | ORAL_TABLET | Freq: Every day | ORAL | 3 refills | Status: DC
  Filled 2023-03-16: qty 45, 90d supply, fill #0

## 2023-03-17 ENCOUNTER — Other Ambulatory Visit (HOSPITAL_COMMUNITY): Payer: Self-pay

## 2023-03-21 ENCOUNTER — Other Ambulatory Visit (HOSPITAL_COMMUNITY): Payer: Self-pay

## 2023-03-21 MED ORDER — PANTOPRAZOLE SODIUM 40 MG PO TBEC
40.0000 mg | DELAYED_RELEASE_TABLET | Freq: Every day | ORAL | 3 refills | Status: DC
  Filled 2023-03-21: qty 90, 90d supply, fill #0

## 2023-03-21 MED ORDER — ATORVASTATIN CALCIUM 80 MG PO TABS
80.0000 mg | ORAL_TABLET | Freq: Every day | ORAL | 3 refills | Status: DC
  Filled 2023-03-21: qty 90, 90d supply, fill #0

## 2023-03-22 ENCOUNTER — Other Ambulatory Visit: Payer: Self-pay

## 2023-03-28 ENCOUNTER — Other Ambulatory Visit (HOSPITAL_COMMUNITY): Payer: Self-pay

## 2023-04-03 ENCOUNTER — Other Ambulatory Visit (HOSPITAL_COMMUNITY): Payer: Self-pay

## 2023-04-10 ENCOUNTER — Ambulatory Visit: Payer: Self-pay | Admitting: Cardiology

## 2023-04-20 ENCOUNTER — Other Ambulatory Visit (HOSPITAL_COMMUNITY): Payer: Self-pay

## 2023-04-20 MED ORDER — SERTRALINE HCL 50 MG PO TABS
ORAL_TABLET | ORAL | 11 refills | Status: AC
Start: 2023-04-20 — End: 2023-08-04
  Filled 2023-04-20: qty 30, 33d supply, fill #0

## 2023-04-20 MED ORDER — QUETIAPINE FUMARATE 25 MG PO TABS
ORAL_TABLET | ORAL | 11 refills | Status: DC
  Filled 2023-04-20: qty 30, 33d supply, fill #0

## 2023-05-02 ENCOUNTER — Other Ambulatory Visit (HOSPITAL_COMMUNITY): Payer: Self-pay

## 2023-05-10 ENCOUNTER — Emergency Department (HOSPITAL_COMMUNITY)

## 2023-05-10 ENCOUNTER — Other Ambulatory Visit: Payer: Self-pay

## 2023-05-10 ENCOUNTER — Inpatient Hospital Stay (HOSPITAL_COMMUNITY)
Admission: EM | Admit: 2023-05-10 | Discharge: 2023-05-13 | DRG: 291 | Disposition: A | Discharge status: Home / Self Care | Source: Skilled Nursing Facility | Attending: Internal Medicine | Admitting: Internal Medicine

## 2023-05-10 DIAGNOSIS — E669 Obesity, unspecified: Secondary | ICD-10-CM | POA: Diagnosis present

## 2023-05-10 DIAGNOSIS — Z7901 Long term (current) use of anticoagulants: Secondary | ICD-10-CM | POA: Diagnosis not present

## 2023-05-10 DIAGNOSIS — Z1152 Encounter for screening for COVID-19: Secondary | ICD-10-CM

## 2023-05-10 DIAGNOSIS — Z8041 Family history of malignant neoplasm of ovary: Secondary | ICD-10-CM | POA: Diagnosis not present

## 2023-05-10 DIAGNOSIS — I4819 Other persistent atrial fibrillation: Secondary | ICD-10-CM | POA: Diagnosis present

## 2023-05-10 DIAGNOSIS — I2722 Pulmonary hypertension due to left heart disease: Secondary | ICD-10-CM | POA: Diagnosis present

## 2023-05-10 DIAGNOSIS — I5033 Acute on chronic diastolic (congestive) heart failure: Secondary | ICD-10-CM | POA: Diagnosis present

## 2023-05-10 DIAGNOSIS — T501X6A Underdosing of loop [high-ceiling] diuretics, initial encounter: Secondary | ICD-10-CM | POA: Diagnosis present

## 2023-05-10 DIAGNOSIS — Z825 Family history of asthma and other chronic lower respiratory diseases: Secondary | ICD-10-CM | POA: Diagnosis not present

## 2023-05-10 DIAGNOSIS — Z79899 Other long term (current) drug therapy: Secondary | ICD-10-CM

## 2023-05-10 DIAGNOSIS — J189 Pneumonia, unspecified organism: Secondary | ICD-10-CM | POA: Diagnosis present

## 2023-05-10 DIAGNOSIS — Z91014 Allergy to mammalian meats: Secondary | ICD-10-CM

## 2023-05-10 DIAGNOSIS — F17210 Nicotine dependence, cigarettes, uncomplicated: Secondary | ICD-10-CM | POA: Diagnosis present

## 2023-05-10 DIAGNOSIS — I130 Hypertensive heart and chronic kidney disease with heart failure and stage 1 through stage 4 chronic kidney disease, or unspecified chronic kidney disease: Secondary | ICD-10-CM | POA: Diagnosis present

## 2023-05-10 DIAGNOSIS — N1831 Chronic kidney disease, stage 3a: Secondary | ICD-10-CM | POA: Diagnosis present

## 2023-05-10 DIAGNOSIS — I3481 Nonrheumatic mitral (valve) annulus calcification: Secondary | ICD-10-CM | POA: Diagnosis present

## 2023-05-10 DIAGNOSIS — Z91148 Patient's other noncompliance with medication regimen for other reason: Secondary | ICD-10-CM

## 2023-05-10 DIAGNOSIS — R0602 Shortness of breath: Secondary | ICD-10-CM | POA: Diagnosis present

## 2023-05-10 DIAGNOSIS — Z833 Family history of diabetes mellitus: Secondary | ICD-10-CM | POA: Diagnosis not present

## 2023-05-10 DIAGNOSIS — Z7984 Long term (current) use of oral hypoglycemic drugs: Secondary | ICD-10-CM

## 2023-05-10 DIAGNOSIS — E785 Hyperlipidemia, unspecified: Secondary | ICD-10-CM | POA: Diagnosis present

## 2023-05-10 DIAGNOSIS — R601 Generalized edema: Secondary | ICD-10-CM | POA: Diagnosis present

## 2023-05-10 DIAGNOSIS — I421 Obstructive hypertrophic cardiomyopathy: Secondary | ICD-10-CM | POA: Diagnosis present

## 2023-05-10 DIAGNOSIS — E876 Hypokalemia: Secondary | ICD-10-CM | POA: Diagnosis present

## 2023-05-10 DIAGNOSIS — I340 Nonrheumatic mitral (valve) insufficiency: Secondary | ICD-10-CM | POA: Diagnosis present

## 2023-05-10 DIAGNOSIS — Z8249 Family history of ischemic heart disease and other diseases of the circulatory system: Secondary | ICD-10-CM | POA: Diagnosis not present

## 2023-05-10 DIAGNOSIS — L03115 Cellulitis of right lower limb: Secondary | ICD-10-CM | POA: Diagnosis present

## 2023-05-10 DIAGNOSIS — G4700 Insomnia, unspecified: Secondary | ICD-10-CM | POA: Diagnosis present

## 2023-05-10 DIAGNOSIS — Y92009 Unspecified place in unspecified non-institutional (private) residence as the place of occurrence of the external cause: Secondary | ICD-10-CM

## 2023-05-10 DIAGNOSIS — Z6831 Body mass index (BMI) 31.0-31.9, adult: Secondary | ICD-10-CM

## 2023-05-10 DIAGNOSIS — L03116 Cellulitis of left lower limb: Secondary | ICD-10-CM | POA: Diagnosis present

## 2023-05-10 DIAGNOSIS — I272 Pulmonary hypertension, unspecified: Secondary | ICD-10-CM | POA: Diagnosis not present

## 2023-05-10 DIAGNOSIS — I5031 Acute diastolic (congestive) heart failure: Secondary | ICD-10-CM | POA: Diagnosis not present

## 2023-05-10 DIAGNOSIS — I509 Heart failure, unspecified: Secondary | ICD-10-CM

## 2023-05-10 LAB — BASIC METABOLIC PANEL WITH GFR
Anion gap: 6 (ref 5–15)
BUN: 18 mg/dL (ref 8–23)
CO2: 19 mmol/L — ABNORMAL LOW (ref 22–32)
Calcium: 8.9 mg/dL (ref 8.9–10.3)
Chloride: 113 mmol/L — ABNORMAL HIGH (ref 98–111)
Creatinine, Ser: 1.12 mg/dL — ABNORMAL HIGH (ref 0.44–1.00)
GFR, Estimated: 53 mL/min — ABNORMAL LOW (ref >60)
Glucose, Bld: 99 mg/dL (ref 70–99)
Potassium: 3.1 mmol/L — ABNORMAL LOW (ref 3.5–5.1)
Sodium: 138 mmol/L (ref 135–145)

## 2023-05-10 LAB — CBC
HCT: 35.6 % — ABNORMAL LOW (ref 36.0–46.0)
Hemoglobin: 11.2 g/dL — ABNORMAL LOW (ref 12.0–15.0)
MCH: 28.6 pg (ref 26.0–34.0)
MCHC: 31.5 g/dL (ref 30.0–36.0)
MCV: 91 fL (ref 80.0–100.0)
Platelets: 174 10*3/uL (ref 150–400)
RBC: 3.91 MIL/uL (ref 3.87–5.11)
RDW: 17.4 % — ABNORMAL HIGH (ref 11.5–15.5)
WBC: 5.8 10*3/uL (ref 4.0–10.5)
nRBC: 0 % (ref 0.0–0.2)

## 2023-05-10 LAB — TROPONIN I (HIGH SENSITIVITY)
Troponin I (High Sensitivity): 18 ng/L — ABNORMAL HIGH (ref <18)
Troponin I (High Sensitivity): 20 ng/L — ABNORMAL HIGH (ref <18)

## 2023-05-10 LAB — BRAIN NATRIURETIC PEPTIDE: B Natriuretic Peptide: 499.6 pg/mL — ABNORMAL HIGH (ref 0.0–100.0)

## 2023-05-10 MED ORDER — FUROSEMIDE 10 MG/ML IJ SOLN
40.0000 mg | Freq: Once | INTRAMUSCULAR | Status: AC
  Administered 2023-05-10: 40 mg via INTRAVENOUS
  Filled 2023-05-10: qty 4

## 2023-05-10 MED ORDER — POTASSIUM CHLORIDE CRYS ER 20 MEQ PO TBCR
40.0000 meq | EXTENDED_RELEASE_TABLET | Freq: Once | ORAL | Status: AC
  Administered 2023-05-10: 40 meq via ORAL
  Filled 2023-05-10: qty 2

## 2023-05-10 MED ORDER — IOHEXOL 350 MG/ML SOLN
75.0000 mL | Freq: Once | INTRAVENOUS | Status: AC | PRN
  Administered 2023-05-10: 75 mL via INTRAVENOUS

## 2023-05-10 NOTE — ED Provider Notes (Signed)
 [place] EMERGENCY DEPARTMENT AT [facility] Provider Note   CSN: [id number] Arrival date & time: 05/10/23  1630     History  Chief Complaint  Patient presents with   Chest Pain   Shortness of Breath    Georgeana Oertel is a 68 y.o. female.  67 year old female with past medical history of cardiomyopathy, hypertension, and hyperlipidemia presenting to the emergency department today with shortness of breath.  The patient states this been going now for the past few days.  She does report some leg swelling during this time.  She reports she has some mild shortness of breath at rest but mainly has the dyspnea with exertion.  She has had minimal cough with this.  Denies any fevers.  She denies any hemoptysis.  She is on Eliquis  for atrial fibrillation as well.  She states that she is supposed to be on Lasix  but ran out of this a few days ago.   Chest Pain Associated symptoms: shortness of breath   Shortness of Breath Associated symptoms: chest pain        Home Medications Prior to Admission medications   Medication Sig Start Date End Date Taking? Authorizing Provider  allopurinol  (ZYLOPRIM ) 100 MG tablet Take 0.5 tablets (50 mg total) by mouth daily for gout 03/16/23     apixaban  (ELIQUIS ) 5 MG TABS tablet Take 1 tablet (5 mg total) by mouth 2 (two) times daily. 02/23/22   Tobb, Kardie, DO  ascorbic acid  (VITAMIN C ) 500 MG tablet Take 1 tablet (500 mg total) by mouth daily for 30 days 06/10/22   Cala Castleman, MD  atorvastatin  (LIPITOR) 80 MG tablet Take 1 tablet (80 mg total) by mouth daily. 09/22/22   Ernestina Headland, MD  atorvastatin  (LIPITOR) 80 MG tablet Take 1 tablet (80 mg total) by mouth daily. 03/21/23     cephALEXin  (KEFLEX ) 250 MG capsule Take 1 capsule (250 mg total) by mouth 4 (four) times daily. 03/06/23   Merdis Stalling, MD  empagliflozin  (JARDIANCE ) 10 MG TABS tablet Take 1 tablet (10 mg total) by mouth daily. 09/21/22   Everhart, Kirstie, DO  escitalopram   (LEXAPRO ) 5 MG tablet Take 1 tablet (5 mg total) by mouth daily. Patient taking differently: Take 5 mg by mouth at bedtime. 09/22/22   Ernestina Headland, MD  furosemide  (LASIX ) 40 MG tablet Take 1 tablet (40 mg total) by mouth daily as needed. For weight gains more than 3 pounds in 1 day or 5 pounds in 1 week. Stop torsemide  12/31/22 12/31/23  Gonfa, Taye T, MD  hydrOXYzine  (ATARAX ) 10 MG tablet Take 1 tablet (10 mg total) by mouth at bedtime. Patient taking differently: Take 10 mg by mouth as needed for anxiety (sleep). 09/21/22   Ernestina Headland, MD  metoprolol  succinate (TOPROL -XL) 25 MG 24 hr tablet Take 1 tablet (25 mg total) by mouth daily. 09/22/22   Ernestina Headland, MD  pantoprazole  (PROTONIX ) 40 MG tablet Take 1 tablet (40 mg total) by mouth daily. Patient not taking: Reported on 12/28/2022 08/19/22   Oral Billings, MD  pantoprazole  (PROTONIX ) 40 MG tablet Take 1 tablet (40 mg total) by mouth daily. 03/21/23     Potassium Chloride  ER 20 MEQ TBCR Take 1 tablet (20 mEq total) by mouth every other day. 03/16/23     Potassium Chloride  ER 20 MEQ TBCR Take 1 tablet (20 mEq total) by mouth daily with fluid medicine. 03/16/23     predniSONE  (DELTASONE ) 10 MG tablet Day 1: Take  2 tablets by mouth at breakfast, 1 at lunch and 1 at dinner, and 2 at bedtime.  Day 2: Take 1 tablet by mouth at breakfast, 1 at lunch and 1 at dinner, and 2 tablets at bedtime.  Day 3: Take 1 tablet at breakfast, 1 at lunch, and 1 at dinner and 1 at bedtime.  Day 4: Take 1 tab by mouth at breakfast, 1 at lunch and 1 at bedtime.  Day 5: Take 1 tab at breakfast and 1 at bedtime.  Day 6: Take 1 tab at breakfast. 03/16/23     QUEtiapine  (SEROQUEL ) 25 MG tablet Take 0.5 tablets (12.5 mg total) by mouth at bedtime for 7 days, THEN 1 tablet (25 mg total) at bedtime for mood and insomnia. 04/20/23 05/30/23    sertraline  (ZOLOFT ) 50 MG tablet Take 0.5 tablets (25 mg total) by mouth daily for 7 days, THEN 1 tablet (50 mg total) daily. 04/20/23 08/04/23     Zinc  Sulfate 220 (50 Zn) MG TABS Take 1 tablet (220 mg total) by mouth daily for 30 days 06/10/22   Cala Castleman, MD      Allergies    Pork-derived products    Review of Systems   Review of Systems  Respiratory:  Positive for shortness of breath.   Cardiovascular:  Positive for chest pain.  All other systems reviewed and are negative.   Physical Exam Updated Vital Signs BP (!) 151/83 (BP Location: Left Arm)   Pulse 84   Temp 97.6 F (36.4 C) (Oral)   Resp 18   Ht 5\' 4"  (1.626 m)   Wt 83.9 kg   SpO2 97%   BMI 31.76 kg/m  Physical Exam Vitals and nursing note reviewed.   Gen: Mild conversational dyspnea noted Eyes: PERRL, EOMI HEENT: no oropharyngeal swelling Neck: trachea midline Resp: Diminished at bilateral lung bases with faint rales noted Card: RRR, no murmurs, rubs, or gallops Abd: nontender, nondistended Extremities: 2+ pitting edema of the bilateral lower extremities Vascular: 2+ radial pulses bilaterally, 2+ DP pulses bilaterally Skin: no rashes Psyc: acting appropriately   ED Results / Procedures / Treatments   Labs (all labs ordered are listed, but only abnormal results are displayed) Labs Reviewed  BASIC METABOLIC PANEL WITH GFR - Abnormal; Notable for the following components:      Result Value   Potassium 3.1 (*)    Chloride 113 (*)    CO2 19 (*)    Creatinine, Ser 1.12 (*)    GFR, Estimated 53 (*)    All other components within normal limits  CBC - Abnormal; Notable for the following components:   Hemoglobin 11.2 (*)    HCT 35.6 (*)    RDW 17.4 (*)    All other components within normal limits  BRAIN NATRIURETIC PEPTIDE - Abnormal; Notable for the following components:   B Natriuretic Peptide 499.6 (*)    All other components within normal limits  TROPONIN I (HIGH SENSITIVITY) - Abnormal; Notable for the following components:   Troponin I (High Sensitivity) 18 (*)    All other components within normal limits  TROPONIN I (HIGH  SENSITIVITY) - Abnormal; Notable for the following components:   Troponin I (High Sensitivity) 20 (*)    All other components within normal limits    EKG EKG Interpretation Date/Time:  Wednesday May 10 2023 16:39:13 EDT Ventricular Rate:  78 PR Interval:    QRS Duration:  100 QT Interval:  401 QTC Calculation: 457 R Axis:  1  Text Interpretation: Atrial fibrillation Nonspecific repol abnormality, diffuse leads Confirmed by Abner Hoffman [phone]) on 05/10/2023 4:47:46 PM  Radiology CT Angio Chest PE W and/or Wo Contrast Result Date: 05/10/2023 CLINICAL DATA:  Chest pain and shortness of breath. EXAM: CT ANGIOGRAPHY CHEST WITH CONTRAST TECHNIQUE: Multidetector CT imaging of the chest was performed using the standard protocol during bolus administration of intravenous contrast. Multiplanar CT image reconstructions and MIPs were obtained to evaluate the vascular anatomy. RADIATION DOSE REDUCTION: This exam was performed according to the departmental dose-optimization program which includes automated exposure control, adjustment of the mA and/or kV according to patient size and/or use of iterative reconstruction technique. CONTRAST:  75mL OMNIPAQUE  IOHEXOL  350 MG/ML SOLN COMPARISON:  None Available. FINDINGS: Cardiovascular: Satisfactory opacification of the pulmonary arteries to the segmental level. No evidence of pulmonary embolism. The heart is enlarged. There are atherosclerotic calcifications of the aorta and coronary arteries. Mediastinum/Nodes: There is a hypodense right thyroid nodule measuring 8 mm. There is heterogeneity in left thyroid gland. There is an is cyst nodule measuring up to 15 mm. There are mildly enlarged paratracheal lymph nodes measuring up to 1 cm. There are nonenlarged bilateral hilar and subcarinal lymph nodes. The visualized esophagus is within normal limits. Lungs/Pleura: Diffuse bilateral multifocal confluent areas of ground-glass opacity are seen throughout both lungs.  There is no pleural effusion or pneumothorax. Trachea and central airways appear patent. Upper Abdomen: No acute abnormality. Musculoskeletal: No chest wall abnormality. No acute or significant osseous findings. Review of the MIP images confirms the above findings. IMPRESSION: 1. No evidence for pulmonary embolism. 2. Diffuse bilateral multifocal confluent areas of ground-glass opacity throughout both lungs. Findings may be related to pulmonary edema or infection. 3. Cardiomegaly. 4. Mildly enlarged paratracheal lymph nodes, likely reactive. 5. Bilateral thyroid nodules measuring up to 15 mm. Recommend thyroid ultrasound (ref: J Am Coll Radiol. 2015 Feb;12(2): 143-50). . Aortic Atherosclerosis (ICD10-I70.0). Electronically Signed   By: Tyron Gallon M.D.   On: 05/10/2023 22:28   DG Chest Port 1 View Result Date: 05/10/2023 CLINICAL DATA:  Short of breath. Chest pain. Congestive heart failure EXAM: PORTABLE CHEST 1 VIEW COMPARISON:  06/25/2023 FINDINGS: Stable enlarged cardiac silhouette. No effusion, infiltrate pneumothorax. No acute osseous abnormality. IMPRESSION: Cardiomegaly.  No acute findings Electronically Signed   By: Deboraha Fallow M.D.   On: 05/10/2023 17:23    Procedures Procedures    Medications Ordered in ED Medications  potassium chloride  SA (KLOR-CON  M) CR tablet 40 mEq (has no administration in time range)  furosemide  (LASIX ) injection 40 mg (has no administration in time range)  iohexol  (OMNIPAQUE ) 350 MG/ML injection 75 mL (75 mLs Intravenous Contrast Given 05/10/23 2209)    ED Course/ Medical Decision Making/ A&P                                 Medical Decision Making 68 year old female with past medical history of cardiomyopathy, hypertension, and hyperlipidemia presenting to the emergency department today with shortness of breath.  I will further evaluate the patient here with basic labs Wels and EKG, chest x-ray, and troponin for further evaluation for ACS, pulmonary  edema, pulmonary infiltrates, pneumothorax p.o. obtain a BMP.  Her exam is certainly consistent with CHF.  She is not requiring any oxygen but is having some conversational dyspnea.  Suspect she will likely require admission.  The patient's BNP is elevated.  Her x-ray did not show any  significant pulmonary edema.  With concern for the lower extremity swelling and no shortness of breath with clear x-ray CT is ordered which does not show any pulmonary embolism but does show relatively significant pulmonary edema.  In speaking with the patient on reassessment she has been having severe dyspnea with very minimal exertion.  She does have pitting edema up to her thighs.  I think that she will benefit from admission for IV diuresis and monitoring her renal function closely.  Calls placed hospital service for admission.  Amount and/or Complexity of Data Reviewed Labs: ordered. Radiology: ordered.  Risk Prescription drug management. Decision regarding hospitalization.           Final Clinical Impression(s) / ED Diagnoses Final diagnoses:  Anasarca  Acute on chronic congestive heart failure, unspecified heart failure type [facility])    Rx / DC Orders ED Discharge Orders     None         Carin Charleston, MD 05/10/23 2304

## 2023-05-10 NOTE — H&P (Signed)
 History and Physical    Christine Jacobson ZOX:[id number] DOB: [birth year] DOA: 05/10/2023  PCP: Terry Ficks, NP  Patient coming from: ALF  I have personally briefly reviewed patient's old medical records in [facility]  Chief Complaint: acute onset of SOB and chest pain   HPI: Christine Jacobson is a 68 y.o. female with medical history significant of  diastolic CHF, HOCM, PAH, moderate TR, A-fib on Eliquis , HTN and obesity  gout, who presents to ED with complaint of acute on set of SOB as well as a 49 hours of increase lower extremity swelling. Patient noted that she ran out of her lasix  a few days ago and since then has started to experience lower extremity swelling . Patient initially endorsed chest pain but when asked currently she denies.She notes no fever,n/v/d/ or abdominal pain , patient does however note dry cough.  ED Course:  Afeb, bp 151/83, hr 84, rr 18 sat 100%  EKG: afib cvr Na :138, K 3.1, CL 113, bicarb 19, Glucose 99, cr 1.12 CE18,20 BNP 499.6 cxr IMPRESSION: Cardiomegaly.  No acute findings CTPA IMPRESSION: 1. No evidence for pulmonary embolism. 2. Diffuse bilateral multifocal confluent areas of ground-glass opacity throughout both lungs. Findings may be related to pulmonary edema or infection. 3. Cardiomegaly. 4. Mildly enlarged paratracheal lymph nodes, likely reactive. 5. Bilateral thyroid nodules measuring up to 15 mm. Recommend thyroid ultrasound (ref: J Am Coll Radiol. 2015 Feb;12(2): 143-50).    Tx lasix  40 , KCL 40 Meq Review of Systems: As per HPI otherwise 10 point review of systems negative.   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   2D ECHO - EF >55%, normal   HTN (hypertension)    Hyperprolactinemia (HCC)     dx in her 18, took meds, self d/c a while back   Liver function test abnormality    normal when repeated   Mild hyperlipidemia    Obesity    Palpitations    negative stress echo in November 2012 with normal LV function; mild LVH, proximal septal thickening with narrow LVOT and mild gradient; mild MR and TR; Cardionet showed PACs in November 2012   Pyoderma gangrenosa    Shortness of breath 07/11/2011   MET TEST    Past Surgical History:  Procedure Laterality Date   BIOPSY  08/10/2022   Procedure: BIOPSY;  Surgeon: Evangeline Hilts, MD;  Location: [facility];  Service: Gastroenterology;;   CARDIOVERSION N/A 02/01/2022   Procedure: CARDIOVERSION;  Surgeon: Jerryl Morin, DO;  Location: [facility];  Service: Cardiovascular;  Laterality: N/A;   CARDIOVERSION N/A 09/20/2022   Procedure: CARDIOVERSION;  Surgeon: Mardell Shade, MD;  Location: [facility];  Service: Cardiovascular;  Laterality: N/A;   COLONOSCOPY WITH PROPOFOL  Left 08/10/2022   Procedure: COLONOSCOPY WITH PROPOFOL ;  Surgeon: Evangeline Hilts, MD;  Location: [facility];  Service: Gastroenterology;  Laterality: Left;   ESOPHAGOGASTRODUODENOSCOPY (EGD) WITH PROPOFOL  Left 08/10/2022   Procedure: ESOPHAGOGASTRODUODENOSCOPY (EGD) WITH PROPOFOL ;  Surgeon: Evangeline Hilts, MD;  Location: [facility];  Service: Gastroenterology;  Laterality: Left;   POLYPECTOMY  08/10/2022   Procedure: POLYPECTOMY;  Surgeon: Evangeline Hilts, MD;  Location: [facility];  Service: Gastroenterology;;   RIGHT HEART CATH N/A 10/15/2021   Procedure: RIGHT HEART CATH;  Surgeon: Fransico Ivy  J, MD;  Location: [facility];  Service: Cardiovascular;  Laterality: N/A;   SKIN GRAFT  2006   porcine, R leg   TEE WITHOUT CARDIOVERSION N/A 09/20/2022   Procedure: TRANSESOPHAGEAL ECHOCARDIOGRAM;  Surgeon: Mardell Shade, MD;  Location: [facility];  Service: Cardiovascular;  Laterality: N/A;     reports that she has quit smoking.  Her smoking use included cigarettes. She has a 4 pack-year smoking history. She has never used smokeless tobacco. She reports that she does not currently use alcohol after a past usage of about 1.0 standard drink of alcohol per week. She reports that she does not use drugs.  Allergies  Allergen Reactions   Pork-Derived Products Other (See Comments)    Religious preference    Family History  Problem Relation Age of Onset   Heart disease Mother        endocarditis   Ovarian cancer Mother    Emphysema Father    Coronary artery disease Other        F in his 43   Diabetes Other        GP   Cancer Neg Hx     Prior to Admission medications   Medication Sig Start Date End Date Taking? Authorizing Provider  allopurinol  (ZYLOPRIM ) 100 MG tablet Take 0.5 tablets (50 mg total) by mouth daily for gout 03/16/23     apixaban  (ELIQUIS ) 5 MG TABS tablet Take 1 tablet (5 mg total) by mouth 2 (two) times daily. 02/23/22   Tobb, Kardie, DO  ascorbic acid  (VITAMIN C ) 500 MG tablet Take 1 tablet (500 mg total) by mouth daily for 30 days 06/10/22   Cala Castleman, MD  atorvastatin  (LIPITOR) 80 MG tablet Take 1 tablet (80 mg total) by mouth daily. 09/22/22   Ernestina Headland, MD  atorvastatin  (LIPITOR) 80 MG tablet Take 1 tablet (80 mg total) by mouth daily. 03/21/23     cephALEXin  (KEFLEX ) 250 MG capsule Take 1 capsule (250 mg total) by mouth 4 (four) times daily. 03/06/23   Merdis Stalling, MD  empagliflozin  (JARDIANCE ) 10 MG TABS tablet Take 1 tablet (10 mg total) by mouth daily. 09/21/22   Everhart, Kirstie, DO  escitalopram  (LEXAPRO ) 5 MG tablet Take 1 tablet (5 mg total) by mouth daily. Patient taking differently: Take 5 mg by mouth at bedtime. 09/22/22   Ernestina Headland, MD  furosemide  (LASIX ) 40 MG tablet Take 1 tablet (40 mg total) by mouth daily as needed. For weight gains more than 3 pounds in 1 day or 5 pounds in 1 week. Stop torsemide  12/31/22 12/31/23  Gonfa, Taye T, MD  hydrOXYzine  (ATARAX ) 10 MG tablet  Take 1 tablet (10 mg total) by mouth at bedtime. Patient taking differently: Take 10 mg by mouth as needed for anxiety (sleep). 09/21/22   Ernestina Headland, MD  metoprolol  succinate (TOPROL -XL) 25 MG 24 hr tablet Take 1 tablet (25 mg total) by mouth daily. 09/22/22   Ernestina Headland, MD  pantoprazole  (PROTONIX ) 40 MG tablet Take 1 tablet (40 mg total) by mouth daily. Patient not taking: Reported on 12/28/2022 08/19/22   Oral Billings, MD  pantoprazole  (PROTONIX ) 40 MG tablet Take 1 tablet (40 mg total) by mouth daily. 03/21/23     Potassium Chloride  ER 20 MEQ TBCR Take 1 tablet (20 mEq total) by mouth every other day. 03/16/23     Potassium Chloride  ER 20 MEQ TBCR Take 1 tablet (20 mEq total) by mouth daily with fluid medicine.  03/16/23     predniSONE  (DELTASONE ) 10 MG tablet Day 1: Take 2 tablets by mouth at breakfast, 1 at lunch and 1 at dinner, and 2 at bedtime.  Day 2: Take 1 tablet by mouth at breakfast, 1 at lunch and 1 at dinner, and 2 tablets at bedtime.  Day 3: Take 1 tablet at breakfast, 1 at lunch, and 1 at dinner and 1 at bedtime.  Day 4: Take 1 tab by mouth at breakfast, 1 at lunch and 1 at bedtime.  Day 5: Take 1 tab at breakfast and 1 at bedtime.  Day 6: Take 1 tab at breakfast. 03/16/23     QUEtiapine  (SEROQUEL ) 25 MG tablet Take 0.5 tablets (12.5 mg total) by mouth at bedtime for 7 days, THEN 1 tablet (25 mg total) at bedtime for mood and insomnia. 04/20/23 05/30/23    sertraline  (ZOLOFT ) 50 MG tablet Take 0.5 tablets (25 mg total) by mouth daily for 7 days, THEN 1 tablet (50 mg total) daily. 04/20/23 08/04/23    Zinc  Sulfate 220 (50 Zn) MG TABS Take 1 tablet (220 mg total) by mouth daily for 30 days 06/10/22   Cala Castleman, MD    Physical Exam: Vitals:   05/10/23 1900 05/10/23 2030 05/10/23 2048 05/10/23 2236  BP: 114/84 125/79 (!) 151/83   Pulse: (!) 101 86 84   Resp:   18   Temp:   97.6 F (36.4 C)   TempSrc:   Oral   SpO2: 99% 99% 100% 97%  Weight:      Height:         Constitutional: NAD, calm, comfortable Vitals:   05/10/23 1900 05/10/23 2030 05/10/23 2048 05/10/23 2236  BP: 114/84 125/79 (!) 151/83   Pulse: (!) 101 86 84   Resp:   18   Temp:   97.6 F (36.4 C)   TempSrc:   Oral   SpO2: 99% 99% 100% 97%  Weight:      Height:       Eyes: PERRL, lids and conjunctivae normal ENMT: Mucous membranes are moist. Posterior pharynx clear of any exudate or lesions.Normal dentition.  Neck: normal, supple, no masses, no thyromegaly Respiratory: +crackles at bases, mild conversation doe.  Cardiovascular: Regular rate and rhythm, no murmurs / rubs / gallops. 3+extremity edema. Warm extremities.  Abdomen: no tenderness, no masses palpated. No hepatosplenomegaly. Bowel sounds positive.  Musculoskeletal: no clubbing / cyanosis. No joint deformity upper and lower extremities. Good ROM, no contractures. Normal muscle tone.  Skin: no rashes, lesions, ulcers. No induration Neurologic: CN 2-12 grossly intact, Strength 5/5 in all 4.  Psychiatric: Normal judgment and insight. Alert and oriented x 3. Normal mood.    Labs on Admission: I have personally reviewed following labs and imaging studies  CBC: Recent Labs  Lab 05/10/23 1645  WBC 5.8  HGB 11.2*  HCT 35.6*  MCV 91.0  PLT 174   Basic Metabolic Panel: Recent Labs  Lab 05/10/23 1645  NA 138  K 3.1*  CL 113*  CO2 19*  GLUCOSE 99  BUN 18  CREATININE 1.12*  CALCIUM  8.9   GFR: Estimated Creatinine Clearance: 49 mL/min (A) (by C-G formula based on SCr of 1.12 mg/dL (H)). Liver Function Tests: No results for input(s): "AST", "ALT", "ALKPHOS", "BILITOT", "PROT", "ALBUMIN " in the last 168 hours. No results for input(s): "LIPASE", "AMYLASE" in the last 168 hours. No results for input(s): "AMMONIA" in the last 168 hours. Coagulation Profile: No results for input(s): "INR", "PROTIME" in  the last 168 hours. Cardiac Enzymes: No results for input(s): "CKTOTAL", "CKMB", "CKMBINDEX", "TROPONINI" in the  last 168 hours. BNP (last 3 results) No results for input(s): "PROBNP" in the last 8760 hours. HbA1C: No results for input(s): "HGBA1C" in the last 72 hours. CBG: No results for input(s): "GLUCAP" in the last 168 hours. Lipid Profile: No results for input(s): "CHOL", "HDL", "LDLCALC", "TRIG", "CHOLHDL", "LDLDIRECT" in the last 72 hours. Thyroid Function Tests: No results for input(s): "TSH", "T4TOTAL", "FREET4", "T3FREE", "THYROIDAB" in the last 72 hours. Anemia Panel: No results for input(s): "VITAMINB12", "FOLATE", "FERRITIN", "TIBC", "IRON ", "RETICCTPCT" in the last 72 hours. Urine analysis:    Component Value Date/Time   COLORURINE YELLOW 06/03/2022 0400   APPEARANCEUR HAZY (A) 06/03/2022 0400   LABSPEC 1.019 06/03/2022 0400   PHURINE 5.0 06/03/2022 0400   GLUCOSEU NEGATIVE 06/03/2022 0400   HGBUR SMALL (A) 06/03/2022 0400   BILIRUBINUR NEGATIVE 06/03/2022 0400   KETONESUR NEGATIVE 06/03/2022 0400   PROTEINUR 30 (A) 06/03/2022 0400   NITRITE NEGATIVE 06/03/2022 0400   LEUKOCYTESUR MODERATE (A) 06/03/2022 0400    Radiological Exams on Admission: CT Angio Chest PE W and/or Wo Contrast Result Date: 05/10/2023 CLINICAL DATA:  Chest pain and shortness of breath. EXAM: CT ANGIOGRAPHY CHEST WITH CONTRAST TECHNIQUE: Multidetector CT imaging of the chest was performed using the standard protocol during bolus administration of intravenous contrast. Multiplanar CT image reconstructions and MIPs were obtained to evaluate the vascular anatomy. RADIATION DOSE REDUCTION: This exam was performed according to the departmental dose-optimization program which includes automated exposure control, adjustment of the mA and/or kV according to patient size and/or use of iterative reconstruction technique. CONTRAST:  75mL OMNIPAQUE  IOHEXOL  350 MG/ML SOLN COMPARISON:  None Available. FINDINGS: Cardiovascular: Satisfactory opacification of the pulmonary arteries to the segmental level. No evidence of  pulmonary embolism. The heart is enlarged. There are atherosclerotic calcifications of the aorta and coronary arteries. Mediastinum/Nodes: There is a hypodense right thyroid nodule measuring 8 mm. There is heterogeneity in left thyroid gland. There is an is cyst nodule measuring up to 15 mm. There are mildly enlarged paratracheal lymph nodes measuring up to 1 cm. There are nonenlarged bilateral hilar and subcarinal lymph nodes. The visualized esophagus is within normal limits. Lungs/Pleura: Diffuse bilateral multifocal confluent areas of ground-glass opacity are seen throughout both lungs. There is no pleural effusion or pneumothorax. Trachea and central airways appear patent. Upper Abdomen: No acute abnormality. Musculoskeletal: No chest wall abnormality. No acute or significant osseous findings. Review of the MIP images confirms the above findings. IMPRESSION: 1. No evidence for pulmonary embolism. 2. Diffuse bilateral multifocal confluent areas of ground-glass opacity throughout both lungs. Findings may be related to pulmonary edema or infection. 3. Cardiomegaly. 4. Mildly enlarged paratracheal lymph nodes, likely reactive. 5. Bilateral thyroid nodules measuring up to 15 mm. Recommend thyroid ultrasound (ref: J Am Coll Radiol. 2015 Feb;12(2): 143-50). . Aortic Atherosclerosis (ICD10-I70.0). Electronically Signed   By: Tyron Gallon M.D.   On: 05/10/2023 22:28   DG Chest Port 1 View Result Date: 05/10/2023 CLINICAL DATA:  Short of breath. Chest pain. Congestive heart failure EXAM: PORTABLE CHEST 1 VIEW COMPARISON:  06/25/2023 FINDINGS: Stable enlarged cardiac silhouette. No effusion, infiltrate pneumothorax. No acute osseous abnormality. IMPRESSION: Cardiomegaly.  No acute findings Electronically Signed   By: Deboraha Fallow M.D.   On: 05/10/2023 17:23    EKG: Independently reviewed. See above  Assessment/Plan  Acute on Chronic CHF with pEF  HOCM -admit to cardiac tele  -place  on chf protocol  -  lasix  iv bid  -resume GDMT - echo in am  - CE note to be flat  Acute on Chronic lower extremity Edema -continue with diuresis -wound care consult to evaluate for Unna boots      Persistent A-fib:  -s/p  TEE/DCCV 09/2022  -currently wit CVR -continue eliquis  and metoprolol     Pulmonary hypertension -per notes -RHC in 2023 showed moderate pulmonary hypertension, likely group 2.  -follow up with pulmonary as outpatient      Hyperlipidemia -Continue home Lipitor   CKD-3A: Stable -Continue monitoring    DVT prophylaxis: Continue Eliquis   Code Status: full/ as discussed per patient wishes in event of cardiac arrest  Family Communication:  Disposition Plan: patient  expected to be admitted greater than 2 midnights  Consults called: cardiology  Admission status: cardiac tele  Sabas Cradle MD [facility]  If 7PM-7AM, please contact night-coverage [email or web address] Password [facility]  05/10/2023, 11:33 PM

## 2023-05-10 NOTE — ED Triage Notes (Signed)
 Acute onset shortness of breath and chest pain, attempting to get to neighbors house to use neighbors phone. Hx of CHF- no meds yesterday, bilateral leg swelling. 324 asa- 0.4 nitroglycerin  with pain subsiding. Clear lungs, no productive cough, on room air.   98 palp after nitro - 118 after 150 cc bolus  Afib rate 84  Cbg 142 20 l hand

## 2023-05-10 NOTE — ED Notes (Signed)
 XR at bedside

## 2023-05-10 NOTE — H&P (Incomplete)
 History and Physical    Christine Jacobson JXB:[id number] DOB: [birth year] DOA: 05/10/2023  PCP: Terry Ficks, NP  Patient coming from: ALF  I have personally briefly reviewed patient's old medical records in [facility]  Chief Complaint: acute onset of SOB and chest pain   HPI: Christine Jacobson is a 68 y.o. female with medical history significant of  diastolic CHF, HOCM, PAH, moderate TR, A-fib on Eliquis , HTN and obesity  gout, who presents to ED with complaint of acute on set of SOB as well as a few days of increase lower extremity swelling. Patient noted that she ran out of her lasix  a few days ago and since then has started to experience lower extremity swelling. Patient noted along with SOB she also noted associated chest pain. She notes no fever, cough , n/v/d/ or abdominal pain .   ED Course:   Review of Systems: As per HPI otherwise 10 point review of systems negative.   Past Medical History:  Diagnosis Date  . Acquired thrombophilia (HCC) 08/05/2022  . Chest pain 11/22/2010   2D STRESS ECHO - EF 60%, peak stress EF 80%, normal, no evidence for stress-induced ischemia  . HOCM (hypertrophic obstructive cardiomyopathy) (HCC) 06/21/2011   2D ECHO - EF >55%, normal  . HTN (hypertension)   . Hyperprolactinemia (HCC)    dx in her 20, took meds, self d/c a while back  . Liver function test abnormality    normal when repeated  . Mild hyperlipidemia   . Obesity   . Palpitations    negative stress echo in November 2012 with normal LV function; mild LVH, proximal septal thickening with narrow LVOT and mild gradient; mild MR and TR; Cardionet showed PACs in November 2012  . Pyoderma gangrenosa   . Shortness of breath 07/11/2011   MET TEST    Past Surgical History:  Procedure Laterality Date  . BIOPSY  08/10/2022   Procedure: BIOPSY;  Surgeon: Evangeline Hilts, MD;  Location: [facility];  Service: Gastroenterology;;  . CARDIOVERSION N/A 02/01/2022   Procedure:  CARDIOVERSION;  Surgeon: Jerryl Morin, DO;  Location: [facility];  Service: Cardiovascular;  Laterality: N/A;  . CARDIOVERSION N/A 09/20/2022   Procedure: CARDIOVERSION;  Surgeon: Mardell Shade, MD;  Location: [facility] INVASIVE CV LAB;  Service: Cardiovascular;  Laterality: N/A;  . COLONOSCOPY WITH PROPOFOL  Left 08/10/2022   Procedure: COLONOSCOPY WITH PROPOFOL ;  Surgeon: Evangeline Hilts, MD;  Location: [facility];  Service: Gastroenterology;  Laterality: Left;  . ESOPHAGOGASTRODUODENOSCOPY (EGD) WITH PROPOFOL  Left 08/10/2022   Procedure: ESOPHAGOGASTRODUODENOSCOPY (EGD) WITH PROPOFOL ;  Surgeon: Evangeline Hilts, MD;  Location: [facility];  Service: Gastroenterology;  Laterality: Left;  . POLYPECTOMY  08/10/2022   Procedure: POLYPECTOMY;  Surgeon: Evangeline Hilts, MD;  Location: [facility];  Service: Gastroenterology;;  . RIGHT HEART CATH N/A 10/15/2021   Procedure: RIGHT HEART CATH;  Surgeon: Cody Das, MD;  Location: [facility] CV LAB;  Service: Cardiovascular;  Laterality: N/A;  . SKIN GRAFT  2006   porcine, R leg  . TEE WITHOUT CARDIOVERSION N/A 09/20/2022   Procedure: TRANSESOPHAGEAL ECHOCARDIOGRAM;  Surgeon: Mardell Shade, MD;  Location: [facility] INVASIVE CV LAB;  Service: Cardiovascular;  Laterality: N/A;     reports that she has quit smoking. Her smoking use included cigarettes. She has a 4 pack-year smoking history. She has never used smokeless tobacco. She reports that she does not currently use alcohol after a past usage of about 1.0 standard drink of alcohol per week.  She reports that she does not use drugs.  Allergies  Allergen Reactions  . Pork-Derived Products Other (See Comments)    Religious preference    Family History  Problem Relation Age of Onset  . Heart disease Mother        endocarditis  . Ovarian cancer Mother   . Emphysema Father   . Coronary artery disease Other        F in his 39  . Diabetes Other        GP  . Cancer Neg Hx    *** Prior to  Admission medications   Medication Sig Start Date End Date Taking? Authorizing Provider  allopurinol  (ZYLOPRIM ) 100 MG tablet Take 0.5 tablets (50 mg total) by mouth daily for gout 03/16/23     apixaban  (ELIQUIS ) 5 MG TABS tablet Take 1 tablet (5 mg total) by mouth 2 (two) times daily. 02/23/22   Tobb, Kardie, DO  ascorbic acid  (VITAMIN C ) 500 MG tablet Take 1 tablet (500 mg total) by mouth daily for 30 days 06/10/22   Cala Castleman, MD  atorvastatin  (LIPITOR) 80 MG tablet Take 1 tablet (80 mg total) by mouth daily. 09/22/22   Ernestina Headland, MD  atorvastatin  (LIPITOR) 80 MG tablet Take 1 tablet (80 mg total) by mouth daily. 03/21/23     cephALEXin  (KEFLEX ) 250 MG capsule Take 1 capsule (250 mg total) by mouth 4 (four) times daily. 03/06/23   Merdis Stalling, MD  empagliflozin  (JARDIANCE ) 10 MG TABS tablet Take 1 tablet (10 mg total) by mouth daily. 09/21/22   Everhart, Kirstie, DO  escitalopram  (LEXAPRO ) 5 MG tablet Take 1 tablet (5 mg total) by mouth daily. Patient taking differently: Take 5 mg by mouth at bedtime. 09/22/22   Ernestina Headland, MD  furosemide  (LASIX ) 40 MG tablet Take 1 tablet (40 mg total) by mouth daily as needed. For weight gains more than 3 pounds in 1 day or 5 pounds in 1 week. Stop torsemide  12/31/22 12/31/23  Gonfa, Taye T, MD  hydrOXYzine  (ATARAX ) 10 MG tablet Take 1 tablet (10 mg total) by mouth at bedtime. Patient taking differently: Take 10 mg by mouth as needed for anxiety (sleep). 09/21/22   Ernestina Headland, MD  metoprolol  succinate (TOPROL -XL) 25 MG 24 hr tablet Take 1 tablet (25 mg total) by mouth daily. 09/22/22   Ernestina Headland, MD  pantoprazole  (PROTONIX ) 40 MG tablet Take 1 tablet (40 mg total) by mouth daily. Patient not taking: Reported on 12/28/2022 08/19/22   Oral Billings, MD  pantoprazole  (PROTONIX ) 40 MG tablet Take 1 tablet (40 mg total) by mouth daily. 03/21/23     Potassium Chloride  ER 20 MEQ TBCR Take 1 tablet (20 mEq total) by mouth every other day. 03/16/23      Potassium Chloride  ER 20 MEQ TBCR Take 1 tablet (20 mEq total) by mouth daily with fluid medicine. 03/16/23     predniSONE  (DELTASONE ) 10 MG tablet Day 1: Take 2 tablets by mouth at breakfast, 1 at lunch and 1 at dinner, and 2 at bedtime.  Day 2: Take 1 tablet by mouth at breakfast, 1 at lunch and 1 at dinner, and 2 tablets at bedtime.  Day 3: Take 1 tablet at breakfast, 1 at lunch, and 1 at dinner and 1 at bedtime.  Day 4: Take 1 tab by mouth at breakfast, 1 at lunch and 1 at bedtime.  Day 5: Take 1 tab at breakfast and 1 at bedtime.  Day 6: Take 1  tab at breakfast. 03/16/23     QUEtiapine  (SEROQUEL ) 25 MG tablet Take 0.5 tablets (12.5 mg total) by mouth at bedtime for 7 days, THEN 1 tablet (25 mg total) at bedtime for mood and insomnia. 04/20/23 05/30/23    sertraline  (ZOLOFT ) 50 MG tablet Take 0.5 tablets (25 mg total) by mouth daily for 7 days, THEN 1 tablet (50 mg total) daily. 04/20/23 08/04/23    Zinc  Sulfate 220 (50 Zn) MG TABS Take 1 tablet (220 mg total) by mouth daily for 30 days 06/10/22   Cala Castleman, MD    Physical Exam: Vitals:   05/10/23 1900 05/10/23 2030 05/10/23 2048 05/10/23 2236  BP: 114/84 125/79 (!) 151/83   Pulse: (!) 101 86 84   Resp:   18   Temp:   97.6 F (36.4 C)   TempSrc:   Oral   SpO2: 99% 99% 100% 97%  Weight:      Height:        Constitutional: NAD, calm, comfortable Vitals:   05/10/23 1900 05/10/23 2030 05/10/23 2048 05/10/23 2236  BP: 114/84 125/79 (!) 151/83   Pulse: (!) 101 86 84   Resp:   18   Temp:   97.6 F (36.4 C)   TempSrc:   Oral   SpO2: 99% 99% 100% 97%  Weight:      Height:       Eyes: PERRL, lids and conjunctivae normal ENMT: Mucous membranes are moist. Posterior pharynx clear of any exudate or lesions.Normal dentition.  Neck: normal, supple, no masses, no thyromegaly Respiratory: clear to auscultation bilaterally, no wheezing, no crackles. Normal respiratory effort. No accessory muscle use.  Cardiovascular: Regular rate and rhythm,  no murmurs / rubs / gallops. No extremity edema. 2+ pedal pulses. No carotid bruits.  Abdomen: no tenderness, no masses palpated. No hepatosplenomegaly. Bowel sounds positive.  Musculoskeletal: no clubbing / cyanosis. No joint deformity upper and lower extremities. Good ROM, no contractures. Normal muscle tone.  Skin: no rashes, lesions, ulcers. No induration Neurologic: CN 2-12 grossly intact. Sensation intact, DTR normal. Strength 5/5 in all 4.  Psychiatric: Normal judgment and insight. Alert and oriented x 3. Normal mood.    Labs on Admission: I have personally reviewed following labs and imaging studies  CBC: Recent Labs  Lab 05/10/23 1645  WBC 5.8  HGB 11.2*  HCT 35.6*  MCV 91.0  PLT 174   Basic Metabolic Panel: Recent Labs  Lab 05/10/23 1645  NA 138  K 3.1*  CL 113*  CO2 19*  GLUCOSE 99  BUN 18  CREATININE 1.12*  CALCIUM  8.9   GFR: Estimated Creatinine Clearance: 49 mL/min (A) (by C-G formula based on SCr of 1.12 mg/dL (H)). Liver Function Tests: No results for input(s): "AST", "ALT", "ALKPHOS", "BILITOT", "PROT", "ALBUMIN " in the last 168 hours. No results for input(s): "LIPASE", "AMYLASE" in the last 168 hours. No results for input(s): "AMMONIA" in the last 168 hours. Coagulation Profile: No results for input(s): "INR", "PROTIME" in the last 168 hours. Cardiac Enzymes: No results for input(s): "CKTOTAL", "CKMB", "CKMBINDEX", "TROPONINI" in the last 168 hours. BNP (last 3 results) No results for input(s): "PROBNP" in the last 8760 hours. HbA1C: No results for input(s): "HGBA1C" in the last 72 hours. CBG: No results for input(s): "GLUCAP" in the last 168 hours. Lipid Profile: No results for input(s): "CHOL", "HDL", "LDLCALC", "TRIG", "CHOLHDL", "LDLDIRECT" in the last 72 hours. Thyroid Function Tests: No results for input(s): "TSH", "T4TOTAL", "FREET4", "T3FREE", "THYROIDAB" in the last  72 hours. Anemia Panel: No results for input(s): "VITAMINB12",  "FOLATE", "FERRITIN", "TIBC", "IRON ", "RETICCTPCT" in the last 72 hours. Urine analysis:    Component Value Date/Time   COLORURINE YELLOW 06/03/2022 0400   APPEARANCEUR HAZY (A) 06/03/2022 0400   LABSPEC 1.019 06/03/2022 0400   PHURINE 5.0 06/03/2022 0400   GLUCOSEU NEGATIVE 06/03/2022 0400   HGBUR SMALL (A) 06/03/2022 0400   BILIRUBINUR NEGATIVE 06/03/2022 0400   KETONESUR NEGATIVE 06/03/2022 0400   PROTEINUR 30 (A) 06/03/2022 0400   NITRITE NEGATIVE 06/03/2022 0400   LEUKOCYTESUR MODERATE (A) 06/03/2022 0400    Radiological Exams on Admission: CT Angio Chest PE W and/or Wo Contrast Result Date: 05/10/2023 CLINICAL DATA:  Chest pain and shortness of breath. EXAM: CT ANGIOGRAPHY CHEST WITH CONTRAST TECHNIQUE: Multidetector CT imaging of the chest was performed using the standard protocol during bolus administration of intravenous contrast. Multiplanar CT image reconstructions and MIPs were obtained to evaluate the vascular anatomy. RADIATION DOSE REDUCTION: This exam was performed according to the departmental dose-optimization program which includes automated exposure control, adjustment of the mA and/or kV according to patient size and/or use of iterative reconstruction technique. CONTRAST:  75mL OMNIPAQUE  IOHEXOL  350 MG/ML SOLN COMPARISON:  None Available. FINDINGS: Cardiovascular: Satisfactory opacification of the pulmonary arteries to the segmental level. No evidence of pulmonary embolism. The heart is enlarged. There are atherosclerotic calcifications of the aorta and coronary arteries. Mediastinum/Nodes: There is a hypodense right thyroid nodule measuring 8 mm. There is heterogeneity in left thyroid gland. There is an is cyst nodule measuring up to 15 mm. There are mildly enlarged paratracheal lymph nodes measuring up to 1 cm. There are nonenlarged bilateral hilar and subcarinal lymph nodes. The visualized esophagus is within normal limits. Lungs/Pleura: Diffuse bilateral multifocal  confluent areas of ground-glass opacity are seen throughout both lungs. There is no pleural effusion or pneumothorax. Trachea and central airways appear patent. Upper Abdomen: No acute abnormality. Musculoskeletal: No chest wall abnormality. No acute or significant osseous findings. Review of the MIP images confirms the above findings. IMPRESSION: 1. No evidence for pulmonary embolism. 2. Diffuse bilateral multifocal confluent areas of ground-glass opacity throughout both lungs. Findings may be related to pulmonary edema or infection. 3. Cardiomegaly. 4. Mildly enlarged paratracheal lymph nodes, likely reactive. 5. Bilateral thyroid nodules measuring up to 15 mm. Recommend thyroid ultrasound (ref: J Am Coll Radiol. 2015 Feb;12(2): 143-50). . Aortic Atherosclerosis (ICD10-I70.0). Electronically Signed   By: Tyron Gallon M.D.   On: 05/10/2023 22:28   DG Chest Port 1 View Result Date: 05/10/2023 CLINICAL DATA:  Short of breath. Chest pain. Congestive heart failure EXAM: PORTABLE CHEST 1 VIEW COMPARISON:  06/25/2023 FINDINGS: Stable enlarged cardiac silhouette. No effusion, infiltrate pneumothorax. No acute osseous abnormality. IMPRESSION: Cardiomegaly.  No acute findings Electronically Signed   By: Deboraha Fallow M.D.   On: 05/10/2023 17:23    EKG: Independently reviewed. ***  Assessment/Plan  Acute on Chronic CHF with pEF  HOCM -admit to cardiac tele  -place on chf protocol  - lasix  iv bid  - echo in am  - cycle CE   Acute on Chronic lower extremity Edema -continue wit diuresis  -wound care to evaluate for need for UNNA aboots      Mitral valve regurgitation: Moderate to severe on TTE in 08/2022.  Stable repeat echo. -Outpatient follow-up   Persistent A-fib: s/p  TEE/DCCV 09/2022.  Back in A-fib but rate controlled.  On Toprol -XL and Eliquis  at home. -Continue home Eliquis  and Toprol -XL.  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     Nausea/vomiting/diarrhea: Unclear etiology of this.  Abdominal exam benign.  Resolved.   Non-anion gap metabolic acidosis: Unclear etiology but resolved with diuretics.   Elevated ALP: Congestive hepatopathy?  Improved.   Physical deconditioning: Patient reports doing poorly.  Anxious to go home. -HH PT/OT/RN but patient refused   Hyperlipidemia -Continue home Lipitor   CKD-3A: Stable -Continue monitoring   Obesity:  Body mass index is 30.92 kg/m.  DVT prophylaxis: *** (Lovenox/Heparin /SCD's/anticoagulated/None (if comfort care) Code Status: *** (Full/Partial (specify details) Family Communication: *** (Specify name, relationship. Do not write "discussed with patient". Specify tel # if discussed over the phone) Disposition Plan: *** (specify when and where you expect patient to be discharged) Consults called: *** (with names) Admission status: *** (inpatient / obs / tele / medical floor / SDU)   Sabas Cradle MD [facility] Pager [phone]  If 7PM-7AM, please contact night-coverage [email or web address] Password [facility]  05/10/2023, 11:33 PM

## 2023-05-11 ENCOUNTER — Other Ambulatory Visit (HOSPITAL_COMMUNITY)

## 2023-05-11 ENCOUNTER — Encounter (HOSPITAL_COMMUNITY): Payer: Self-pay | Admitting: Internal Medicine

## 2023-05-11 DIAGNOSIS — I421 Obstructive hypertrophic cardiomyopathy: Secondary | ICD-10-CM

## 2023-05-11 DIAGNOSIS — I4819 Other persistent atrial fibrillation: Secondary | ICD-10-CM

## 2023-05-11 DIAGNOSIS — I272 Pulmonary hypertension, unspecified: Secondary | ICD-10-CM

## 2023-05-11 DIAGNOSIS — I5033 Acute on chronic diastolic (congestive) heart failure: Secondary | ICD-10-CM | POA: Diagnosis not present

## 2023-05-11 DIAGNOSIS — E785 Hyperlipidemia, unspecified: Secondary | ICD-10-CM

## 2023-05-11 DIAGNOSIS — J189 Pneumonia, unspecified organism: Secondary | ICD-10-CM | POA: Diagnosis not present

## 2023-05-11 DIAGNOSIS — I340 Nonrheumatic mitral (valve) insufficiency: Secondary | ICD-10-CM

## 2023-05-11 DIAGNOSIS — E876 Hypokalemia: Secondary | ICD-10-CM

## 2023-05-11 LAB — BRAIN NATRIURETIC PEPTIDE: B Natriuretic Peptide: 551 pg/mL — ABNORMAL HIGH (ref 0.0–100.0)

## 2023-05-11 LAB — COMPREHENSIVE METABOLIC PANEL WITH GFR
ALT: 53 U/L — ABNORMAL HIGH (ref 0–44)
AST: 44 U/L — ABNORMAL HIGH (ref 15–41)
Albumin: 3.3 g/dL — ABNORMAL LOW (ref 3.5–5.0)
Alkaline Phosphatase: 246 U/L — ABNORMAL HIGH (ref 38–126)
Anion gap: 10 (ref 5–15)
BUN: 17 mg/dL (ref 8–23)
CO2: 25 mmol/L (ref 22–32)
Calcium: 9.4 mg/dL (ref 8.9–10.3)
Chloride: 106 mmol/L (ref 98–111)
Creatinine, Ser: 1.11 mg/dL — ABNORMAL HIGH (ref 0.44–1.00)
GFR, Estimated: 53 mL/min — ABNORMAL LOW (ref >60)
Glucose, Bld: 93 mg/dL (ref 70–99)
Potassium: 3 mmol/L — ABNORMAL LOW (ref 3.5–5.1)
Sodium: 141 mmol/L (ref 135–145)
Total Bilirubin: 1 mg/dL (ref 0.0–1.2)
Total Protein: 6.3 g/dL — ABNORMAL LOW (ref 6.5–8.1)

## 2023-05-11 LAB — CBC
HCT: 35.5 % — ABNORMAL LOW (ref 36.0–46.0)
Hemoglobin: 11.7 g/dL — ABNORMAL LOW (ref 12.0–15.0)
MCH: 29.5 pg (ref 26.0–34.0)
MCHC: 33 g/dL (ref 30.0–36.0)
MCV: 89.4 fL (ref 80.0–100.0)
Platelets: 198 10*3/uL (ref 150–400)
RBC: 3.97 MIL/uL (ref 3.87–5.11)
RDW: 17.5 % — ABNORMAL HIGH (ref 11.5–15.5)
WBC: 7.1 10*3/uL (ref 4.0–10.5)
nRBC: 0 % (ref 0.0–0.2)

## 2023-05-11 LAB — MAGNESIUM: Magnesium: 1.8 mg/dL (ref 1.7–2.4)

## 2023-05-11 MED ORDER — VITAMIN C 500 MG PO TABS
500.0000 mg | ORAL_TABLET | Freq: Every day | ORAL | Status: DC
  Administered 2023-05-11 – 2023-05-13 (×3): 500 mg via ORAL
  Filled 2023-05-11 (×3): qty 1

## 2023-05-11 MED ORDER — SERTRALINE HCL 50 MG PO TABS: 50.0000 mg | ORAL_TABLET | Freq: Every day | ORAL | Status: DC

## 2023-05-11 MED ORDER — PANTOPRAZOLE SODIUM 40 MG PO TBEC
40.0000 mg | DELAYED_RELEASE_TABLET | Freq: Every day | ORAL | Status: DC
  Administered 2023-05-11 – 2023-05-13 (×3): 40 mg via ORAL
  Filled 2023-05-11: qty 1
  Filled 2023-05-11: qty 2
  Filled 2023-05-11: qty 1

## 2023-05-11 MED ORDER — ALBUTEROL SULFATE (2.5 MG/3ML) 0.083% IN NEBU: 2.5000 mg | INHALATION_SOLUTION | RESPIRATORY_TRACT | Status: DC | PRN

## 2023-05-11 MED ORDER — ONDANSETRON HCL 4 MG PO TABS: 4.0000 mg | ORAL_TABLET | Freq: Four times a day (QID) | ORAL | Status: DC | PRN

## 2023-05-11 MED ORDER — ACETAMINOPHEN 650 MG RE SUPP: 650.0000 mg | Freq: Four times a day (QID) | RECTAL | Status: DC | PRN

## 2023-05-11 MED ORDER — QUETIAPINE FUMARATE 25 MG PO TABS: 25.0000 mg | ORAL_TABLET | Freq: Every day | ORAL | Status: DC

## 2023-05-11 MED ORDER — METOPROLOL SUCCINATE ER 25 MG PO TB24
25.0000 mg | ORAL_TABLET | Freq: Every day | ORAL | Status: DC
  Administered 2023-05-11 – 2023-05-13 (×3): 25 mg via ORAL
  Filled 2023-05-11 (×3): qty 1

## 2023-05-11 MED ORDER — POTASSIUM CHLORIDE CRYS ER 20 MEQ PO TBCR
40.0000 meq | EXTENDED_RELEASE_TABLET | Freq: Once | ORAL | Status: AC
  Administered 2023-05-11: 40 meq via ORAL
  Filled 2023-05-11: qty 2

## 2023-05-11 MED ORDER — ESCITALOPRAM OXALATE 10 MG PO TABS: 5.0000 mg | ORAL_TABLET | Freq: Every day | ORAL | Status: DC

## 2023-05-11 MED ORDER — ONDANSETRON HCL 4 MG/2ML IJ SOLN: 4.0000 mg | Freq: Four times a day (QID) | INTRAMUSCULAR | Status: DC | PRN

## 2023-05-11 MED ORDER — SODIUM CHLORIDE 0.9 % IV SOLN
1.0000 g | Freq: Every day | INTRAVENOUS | Status: DC
  Administered 2023-05-11 – 2023-05-13 (×3): 1 g via INTRAVENOUS
  Filled 2023-05-11 (×3): qty 10

## 2023-05-11 MED ORDER — FUROSEMIDE 10 MG/ML IJ SOLN
40.0000 mg | Freq: Two times a day (BID) | INTRAMUSCULAR | Status: DC
  Administered 2023-05-11 – 2023-05-13 (×5): 40 mg via INTRAVENOUS
  Filled 2023-05-11 (×4): qty 4

## 2023-05-11 MED ORDER — ALLOPURINOL 100 MG PO TABS
50.0000 mg | ORAL_TABLET | Freq: Every day | ORAL | Status: DC
  Administered 2023-05-11 – 2023-05-13 (×3): 50 mg via ORAL
  Filled 2023-05-11 (×3): qty 1

## 2023-05-11 MED ORDER — MAGNESIUM SULFATE 2 GM/50ML IV SOLN
2.0000 g | Freq: Once | INTRAVENOUS | Status: AC
  Administered 2023-05-11: 2 g via INTRAVENOUS
  Filled 2023-05-11: qty 50

## 2023-05-11 MED ORDER — POTASSIUM CHLORIDE CRYS ER 20 MEQ PO TBCR: 20.0000 meq | EXTENDED_RELEASE_TABLET | Freq: Every day | ORAL | Status: DC

## 2023-05-11 MED ORDER — ZINC SULFATE 220 (50 ZN) MG PO CAPS
220.0000 mg | ORAL_CAPSULE | Freq: Every day | ORAL | Status: DC
  Administered 2023-05-11 – 2023-05-13 (×3): 220 mg via ORAL
  Filled 2023-05-11 (×3): qty 1

## 2023-05-11 MED ORDER — ACETAMINOPHEN 325 MG PO TABS
650.0000 mg | ORAL_TABLET | Freq: Four times a day (QID) | ORAL | Status: DC | PRN
  Administered 2023-05-12: 650 mg via ORAL
  Filled 2023-05-11: qty 2

## 2023-05-11 MED ORDER — MELATONIN 3 MG PO TABS
6.0000 mg | ORAL_TABLET | Freq: Every day | ORAL | Status: DC
  Administered 2023-05-11 – 2023-05-12 (×2): 6 mg via ORAL
  Filled 2023-05-11 (×2): qty 2

## 2023-05-11 MED ORDER — APIXABAN 5 MG PO TABS
5.0000 mg | ORAL_TABLET | Freq: Two times a day (BID) | ORAL | Status: DC
  Administered 2023-05-11 – 2023-05-13 (×6): 5 mg via ORAL
  Filled 2023-05-11 (×6): qty 1

## 2023-05-11 NOTE — Progress Notes (Signed)
  Progress Note   Patient: Christine Jacobson NFA:[id number] DOB: 11-19-1955 DOA: 05/10/2023     1 DOS: the patient was seen and examined on 05/11/2023   Assessment and Plan: Acute on chronic HFpEF - IV lasix  40 mg q12  - Cardio following  - ECHO pending   Chronic afib - Eliquis  5 mg PO bid  - Toprol  XL 25 mg PO daily   Pulmonary HTN  - Management as above  - Albuterol  q2 hr PRN   CKD3a - Monitor   5. B/l lower leg cellulitis  - IV ceftriaxone  1 g daily   Subjective: Pt seen and examined at the bedside. Doing well with IV lasix . IV mag 2 g given today. IV ceftriaxone  ordered for lower ext cellulitis. Appreciate cardiology following along. Statin on hold due to LFT elevation.  Physical Exam: Vitals:   05/11/23 0915 05/11/23 1000 05/11/23 1200 05/11/23 1252  BP: 114/84 109/82 (!) 150/102 (!) 122/92  Pulse: 87  64 84  Resp: 18 17 12 15   Temp:    (!) 97.5 F (36.4 C)  TempSrc:    Oral  SpO2: 100%  100% 98%  Weight:      Height:       Physical Exam HENT:     Head: Normocephalic.  Cardiovascular:     Rate and Rhythm: Normal rate.  Pulmonary:     Effort: Pulmonary effort is normal.  Abdominal:     Palpations: Abdomen is soft.  Musculoskeletal:     Comments: 2+ pitting edema b/l legs with L >R cellulitis   Skin:    Findings: Rash present.  Neurological:     Mental Status: She is alert and oriented to person, place, and time.  Psychiatric:        Mood and Affect: Mood normal.      Disposition: Status is: Inpatient Remains inpatient appropriate because: IV lasix  and IV antibx   Planned Discharge Destination: Barriers to discharge: As above     Time spent: 35 minutes  Author: Danita Proud , MD 05/11/2023 12:59 PM  For on call review [email or web address].

## 2023-05-11 NOTE — Consult Note (Addendum)
 Cardiology Consultation   Patient ID: Christine Jacobson MRN: [id number]; DOB: 1955/03/21  Admit date: 05/10/2023 Date of Consult: 05/11/2023  PCP:  Terry Ficks, NP   [facility] Providers Cardiologist:  Jerryl Morin, DO     Patient Profile:   Christine Jacobson is a 68 y.o. female with a hx of HFpEF, HCM, pulmonary HTN, persistent atrial fibrillation status post prior DCCV, HTN who is being seen 05/11/2023 for the evaluation of CHF at the request of Dr. Carlette Cheers.  History of Present Illness:   Christine Jacobson is a 68 yo female with PMH noted above, followed by Dr. Emmette Harms as an outpatient. Known history of hypertrophic cardiomyopathy with gradient of 64 mmHg.  Seen by Dr. Willia Harries May 2024 and reported not taking her Lasix  at that time.  She was admitted 1 month later with cellulitis of bilateral lower extremities.  Also noted to have acute blood loss anemia and required transfusion.  Underwent EGD and colonoscopy 07/2022 that showed gastritis, hemorrhoids and diverticulosis.  Admitted 09/2022 with atrial fibrillation with RVR undergoing TEE/DCCV.  She was continued on metoprolol  5 mg twice daily and resumed on metoprolol  succinate 25 mg daily.  TTE that admission with findings suggestive of hypertrophic obstructive cardiomyopathy with 75 mm continuous mean gradient, along with moderate to severe MR, mild mitral stenosis, severe mitral annular calcification.  Recommended for outpatient echocardiogram to reassess MR with potential referral to structural team if remained severe.    Admitted 12/2022 with recurrent heart failure.  Echocardiogram that admission showed LVEF of 65 to 70%, no regional wall motion abnormality, severe concentric LVH, moderate to severe MR, no significant aortic valve stenosis, atrophic obstructive cardiomyopathy with SAM of mitral valve and peak LVOT gradient of 61 mmHg.  She was noted to be back in atrial fibrillation but recommendations for rate control.  Continued on  Toprol  XL 25 mg daily as well as Eliquis  5 mg twice daily.  Was referred to Dr. Paulita Boss for HOCM as an outpatient.  Presented to the ED on 4/23 with complaints of worsening shortness of breath, lower extremity edema and orthopnea.  Reports that she ran out of her medications about 2 days ago and had been unable to get them refilled as her cell phone was turned off by [employer].  In the ED labs showed sodium 138, potassium 3.1, creatinine 1.12, AST 44, ALT 53, BNP 499, high-sensitivity troponin 18>>20, WBC 5.8, hemoglobin 11.2.  Chest X-ray with cardiomegaly, no other acute findings.  EKG, atrial fibrillation, 78 bpm, nonspecific changes.  Started on IV Lasix  and cardiology asked to evaluate.  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   2D ECHO - EF >55%, normal   HTN (hypertension)    Hyperprolactinemia (HCC)    dx in her 18, took meds, self d/c a while back   Liver function test abnormality    normal when repeated   Mild hyperlipidemia    Obesity    Palpitations    negative stress echo in November 2012 with normal LV function; mild LVH, proximal septal thickening with narrow LVOT and mild gradient; mild MR and TR; Cardionet showed PACs in November 2012   Pyoderma gangrenosa    Shortness of breath 07/11/2011   MET TEST    Past Surgical History:  Procedure Laterality Date   BIOPSY  08/10/2022   Procedure: BIOPSY;  Surgeon: Evangeline Hilts, MD;  Location: [facility];  Service: Gastroenterology;;   CARDIOVERSION N/A 02/01/2022   Procedure: CARDIOVERSION;  Surgeon: Jerryl Morin, DO;  Location: [facility];  Service: Cardiovascular;  Laterality: N/A;   CARDIOVERSION N/A 09/20/2022   Procedure: CARDIOVERSION;  Surgeon: Mardell Shade, MD;  Location: [facility] INVASIVE  CV LAB;  Service: Cardiovascular;  Laterality: N/A;   COLONOSCOPY WITH PROPOFOL  Left 08/10/2022   Procedure: COLONOSCOPY WITH PROPOFOL ;  Surgeon: Evangeline Hilts, MD;  Location: [facility];  Service: Gastroenterology;  Laterality: Left;   ESOPHAGOGASTRODUODENOSCOPY (EGD) WITH PROPOFOL  Left 08/10/2022   Procedure: ESOPHAGOGASTRODUODENOSCOPY (EGD) WITH PROPOFOL ;  Surgeon: Evangeline Hilts, MD;  Location: [facility];  Service: Gastroenterology;  Laterality: Left;   POLYPECTOMY  08/10/2022   Procedure: POLYPECTOMY;  Surgeon: Evangeline Hilts, MD;  Location: [facility];  Service: Gastroenterology;;   RIGHT HEART CATH N/A 10/15/2021   Procedure: RIGHT HEART CATH;  Surgeon: Cody Das, MD;  Location: [facility] CV [facility];  Service: Cardiovascular;  Laterality: N/A;   SKIN GRAFT  2006   porcine, R leg   TEE WITHOUT CARDIOVERSION N/A 09/20/2022   Procedure: TRANSESOPHAGEAL ECHOCARDIOGRAM;  Surgeon: Mardell Shade, MD;  Location: [facility] CV [facility];  Service: Cardiovascular;  Laterality: N/A;     Inpatient Medications: Scheduled Meds:  allopurinol   50 mg Oral Daily   apixaban   5 mg Oral BID   ascorbic acid   500 mg Oral Daily   furosemide   40 mg Intravenous Q12H   metoprolol  succinate  25 mg Oral Daily   pantoprazole   40 mg Oral Daily   potassium chloride   40 mEq Oral Once   zinc  sulfate (50mg  elemental zinc )  220 mg Oral Daily   Continuous Infusions:  PRN Meds: acetaminophen  **OR** acetaminophen , albuterol , ondansetron  **OR** ondansetron  (ZOFRAN ) IV  Allergies:    Allergies  Allergen Reactions   Pork-Derived Products Other (See Comments)    Religious preference    Social History:   Social History   Socioeconomic History   Marital status: Single    Spouse name: Not on file   Number of children: 0   Years of education: Not on file   Highest education level: Not on file  Occupational History   Occupation: para Psychologist, forensic, part time cook  Tobacco Use   Smoking  status: Former    Current packs/day: 1.00    Average packs/day: 1 pack/day for 4.0 years (4.0 ttl pk-yrs)    Types: Cigarettes   Smokeless tobacco: Never  Vaping Use   Vaping status: Never Used  Substance and Sexual Activity   Alcohol use: Not Currently    Alcohol/week: 1.0 standard drink of alcohol    Types: 1 Glasses of wine per week    Comment: "I do have my wine" every day, has cut down from 2 bottles at a time, currently 2-3 glasses   Drug use: No   Sexual activity: Not Currently  Other Topics Concern   Not on file  Social History Narrative   Lives by self   Social Drivers of Health   Financial Resource Strain: Low Risk  (12/03/2021)   Overall Financial Resource Strain (CARDIA)    Difficulty of Paying Living Expenses: Not very hard  Food Insecurity: No Food Insecurity (12/27/2022)   Hunger Vital Sign    Worried About Running Out of Food in the Last Year: Never true    Ran Out of Food in the  Last Year: Never true  Transportation Needs: No Transportation Needs (12/27/2022)   PRAPARE - Administrator, Civil Service (Medical): No    Lack of Transportation (Non-Medical): No  Physical Activity: Not on file  Stress: Not on file  Social Connections: Not on file  Intimate Partner Violence: Not At Risk (12/27/2022)   Humiliation, Afraid, Rape, and Kick questionnaire    Fear of Current or Ex-Partner: No    Emotionally Abused: No    Physically Abused: No    Sexually Abused: No    Family History:    Family History  Problem Relation Age of Onset   Heart disease Mother        endocarditis   Ovarian cancer Mother    Emphysema Father    Coronary artery disease Other        F in his 104   Diabetes Other        GP   Cancer Neg Hx      ROS:  Please see the history of present illness.   All other ROS reviewed and negative.     Physical Exam/Data:   Vitals:   05/11/23 0058 05/11/23 0505 05/11/23 0507 05/11/23 0827  BP:  (!) 149/92 (!) 149/92 (!) 140/96   Pulse:  80 80 93  Resp:   18 14  Temp: 97.6 F (36.4 C) 97.7 F (36.5 C) 97.7 F (36.5 C) 97.7 F (36.5 C)  TempSrc: Oral Oral Oral Oral  SpO2:  100% 100% 99%  Weight:      Height:       No intake or output data in the 24 hours ending 05/11/23 0932    05/10/2023    4:33 PM 03/06/2023    3:19 PM 12/31/2022    5:00 AM  Last 3 Weights  Weight (lbs) 185 lb 192 lb 10.9 oz 180 lb 1.9 oz  Weight (kg) 83.915 kg 87.4 kg 81.7 kg     Body mass index is 31.76 kg/m.  General:  Well nourished, well developed, in no acute distress HEENT: normal Neck: + JVD Vascular: No carotid bruits; Distal pulses 2+ bilaterally Cardiac:  normal S1, S2; RRR; 2/6 systolic murmur LLSB  Lungs:  clear to auscultation bilaterally, no wheezing, rhonchi or rales  Abd: soft, nontender, no hepatomegaly  Ext: 3+ bilateral LE edema, scattered bruising and lacerations noted Musculoskeletal:  No deformities, BUE and BLE strength normal and equal Skin: warm and dry  Neuro: no focal abnormalities noted Psych:  Normal affect   EKG:  The EKG was personally reviewed and demonstrates:  Atrial fibrillation, 78 bpm, nonspecific changes  Telemetry:  Telemetry was personally reviewed and demonstrates:  Atrial fibrillation mostly rate controlled, but spikes into the 140s briefly noted   Relevant CV Studies:  Echo: 12/2022  IMPRESSIONS     1. Left ventricular ejection fraction, by estimation, is 65 to 70%. The  left ventricle has normal function. The left ventricle has no regional  wall motion abnormalities. There is severe concentric left ventricular  hypertrophy.   2. The mitral valve is degenerative. Moderate to severe mitral valve  regurgitation. No evidence of mitral stenosis. Severe mitral annular  calcification.   3. The aortic valve is tricuspid. There is mild calcification of the  aortic valve. There is mild thickening of the aortic valve. Aortic valve  regurgitation is not visualized. Aortic valve  sclerosis is present, with  no evidence of aortic valve stenosis.   Comparison(s): No significant change from prior study.  HOCM with SAM of  mitral valve, mod-severe MR, and peak LVOT gradient 61 mmHg.   FINDINGS   Left Ventricle: Systolic anterior motion of the mitral valve. LVOT peak  gradient 61 mmHg. Left ventricular ejection fraction, by estimation, is 65  to 70%. The left ventricle has normal function. The left ventricle has no  regional wall motion  abnormalities. Definity  contrast agent was given IV to delineate the left  ventricular endocardial borders. There is severe concentric left  ventricular hypertrophy.   Mitral Valve: The mitral valve is degenerative in appearance. Severe  mitral annular calcification. Moderate to severe mitral valve  regurgitation, with eccentric posteriorly directed jet. No evidence of  mitral valve stenosis.   Aortic Valve: The aortic valve is tricuspid. There is mild calcification  of the aortic valve. There is mild thickening of the aortic valve. Aortic  valve regurgitation is not visualized. Aortic valve sclerosis is present,  with no evidence of aortic valve  stenosis.   Laboratory Data:  High Sensitivity Troponin:   Recent Labs  Lab 05/10/23 1645 05/10/23 1845  TROPONINIHS 18* 20*     Chemistry Recent Labs  Lab 05/10/23 1645 05/11/23 0450  NA 138 141  K 3.1* 3.0*  CL 113* 106  CO2 19* 25  GLUCOSE 99 93  BUN 18 17  CREATININE 1.12* 1.11*  CALCIUM  8.9 9.4  GFRNONAA 53* 53*  ANIONGAP 6 10    Recent Labs  Lab 05/11/23 0450  PROT 6.3*  ALBUMIN  3.3*  AST 44*  ALT 53*  ALKPHOS 246*  BILITOT 1.0   Lipids No results for input(s): "CHOL", "TRIG", "HDL", "LABVLDL", "LDLCALC", "CHOLHDL" in the last 168 hours.  Hematology Recent Labs  Lab 05/10/23 1645 05/11/23 0450  WBC 5.8 7.1  RBC 3.91 3.97  HGB 11.2* 11.7*  HCT 35.6* 35.5*  MCV 91.0 89.4  MCH 28.6 29.5  MCHC 31.5 33.0  RDW 17.4* 17.5*  PLT 174 198   Thyroid  No results for input(s): "TSH", "FREET4" in the last 168 hours.  BNP Recent Labs  Lab 05/10/23 1645 05/11/23 0306  BNP 499.6* 551.0*    DDimer No results for input(s): "DDIMER" in the last 168 hours.   Radiology/Studies:  CT Angio Chest PE W and/or Wo Contrast Result Date: 05/10/2023 CLINICAL DATA:  Chest pain and shortness of breath. EXAM: CT ANGIOGRAPHY CHEST WITH CONTRAST TECHNIQUE: Multidetector CT imaging of the chest was performed using the standard protocol during bolus administration of intravenous contrast. Multiplanar CT image reconstructions and MIPs were obtained to evaluate the vascular anatomy. RADIATION DOSE REDUCTION: This exam was performed according to the departmental dose-optimization program which includes automated exposure control, adjustment of the mA and/or kV according to patient size and/or use of iterative reconstruction technique. CONTRAST:  75mL OMNIPAQUE  IOHEXOL  350 MG/ML SOLN COMPARISON:  None Available. FINDINGS: Cardiovascular: Satisfactory opacification of the pulmonary arteries to the segmental level. No evidence of pulmonary embolism. The heart is enlarged. There are atherosclerotic calcifications of the aorta and coronary arteries. Mediastinum/Nodes: There is a hypodense right thyroid nodule measuring 8 mm. There is heterogeneity in left thyroid gland. There is an is cyst nodule measuring up to 15 mm. There are mildly enlarged paratracheal lymph nodes measuring up to 1 cm. There are nonenlarged bilateral hilar and subcarinal lymph nodes. The visualized esophagus is within normal limits. Lungs/Pleura: Diffuse bilateral multifocal confluent areas of ground-glass opacity are seen throughout both lungs. There is no pleural effusion or pneumothorax. Trachea and central airways appear patent. Upper  Abdomen: No acute abnormality. Musculoskeletal: No chest wall abnormality. No acute or significant osseous findings. Review of the MIP images confirms the above findings.  IMPRESSION: 1. No evidence for pulmonary embolism. 2. Diffuse bilateral multifocal confluent areas of ground-glass opacity throughout both lungs. Findings may be related to pulmonary edema or infection. 3. Cardiomegaly. 4. Mildly enlarged paratracheal lymph nodes, likely reactive. 5. Bilateral thyroid nodules measuring up to 15 mm. Recommend thyroid ultrasound (ref: J Am Coll Radiol. 2015 Feb;12(2): 143-50). . Aortic Atherosclerosis (ICD10-I70.0). Electronically Signed   By: Tyron Gallon M.D.   On: 05/10/2023 22:28   DG Chest Port 1 View Result Date: 05/10/2023 CLINICAL DATA:  Short of breath. Chest pain. Congestive heart failure EXAM: PORTABLE CHEST 1 VIEW COMPARISON:  06/25/2023 FINDINGS: Stable enlarged cardiac silhouette. No effusion, infiltrate pneumothorax. No acute osseous abnormality. IMPRESSION: Cardiomegaly.  No acute findings Electronically Signed   By: Deboraha Fallow M.D.   On: 05/10/2023 17:23     Assessment and Plan:   Christine Jacobson is a 68 y.o. female with a hx of HFpEF, HCM, pulmonary HTN, persistent atrial fibrillation status post prior DCCV, HTN who is being seen 05/11/2023 for the evaluation of CHF at the request of Dr. Carlette Cheers.  Acute on Chronic HFpEF Hypertrophic obstructive cardiomyopathy Pulmonary HTN -- presented to the ED with complaints of worsening shortness of breath, LE edema and orthopnea. Says she ran out of her meds about 2 days ago and unable to get them filled as her cell phone service was terminated. Unclear what her true dry weight is -- BNP 499, CXR ok but she is significantly volume overloaded on exam with 3+ bilateral LE edema  -- on IV lasix  40mg  BID, reports some UOP thus far, continue -- repeat echo pending -- continue Toprol  XL, last notes indicate she was suppose to be on jardiance  but does not appear on her home med list  Persistent atrial fibrillation -- rate control has been recommended in the past -- has been out of her medications the past  couple of days including eliquis  -- mostly controlled with brief elevation of rates into the 140s -- continue Toprol  XL 25mg  daily, may need to increased pending her HR trends -- continue eliquis  5mg  BID  Mitral regurgitation -- echo 12/2022 with moderate to severe MR with severe mitral annular calcification  -- repeat echo pending, further recommendations based on findings   Hypokalemia -- suppl, check mag  HLD -- continue statin  Risk Assessment/Risk Scores:   New York  Heart Association (NYHA) Functional Class NYHA Class III  CHA2DS2-VASc Score = 4   This indicates a 4.8% annual risk of stroke. The patient's score is based upon: CHF History: 1 HTN History: 1 Diabetes History: 0 Stroke History: 0 Vascular Disease History: 0 Age Score: 1 Gender Score: 1 For questions or updates, please contact [facility] Please consult [email or web address] for contact info under  Signed, Johnie Nailer, NP  05/11/2023 9:32 AM  Patient seen and examined, note reviewed with the signed Advanced Practice Provider. I personally reviewed laboratory data, imaging studies and relevant notes. I independently examined the patient and formulated the important aspects of the plan. I have personally discussed the plan with the patient and/or family. Comments or changes to the note/plan are indicated below.  Acute on Chronic HFpEF Hypertrophic obstructive cardiomyopathy Pulmonary HTN Persistent atrial fibrillation Mitral regurgitation    Clinically she appeared to be volume overloaded - she needs diuretics. Continue current dose of Lasix .  Her atrial  fibrillation is rate controlled, for now it will be best to diurese her prior to considering TEE/DCCV. We will restart her home med for her GDMT - echo pending further recs to come after echo   Jerryl Morin DO, MS [facility] Attending Cardiologist [facility]  [address] [place], [place] [address] [phone] Website:  [email or web address]

## 2023-05-11 NOTE — Consult Note (Addendum)
 WOC team consulted  to "evaluate for unna boots".  This patient is very familiar to [facility] team from multiple admissions with edema and venous insufficiency.  Has had multiple ulcerations to B legs in the past although per H&P only edema this admission, no mention of open wounds.    She has worn unna boots in the past however is refusing them at this time.  She also refuses even Kerlix and Coban which would be a light form of compression.    WOC team will sign off at this time. If there are no wounds present MD can place order for ortho tech to come and apply unna boots when/if patient agrees.   Thank you,    Ronni Colace MSN, RN-BC, [employer] [phone]

## 2023-05-11 NOTE — ED Notes (Addendum)
 Pt irritable when asked about wearing her ulna boots.  She states she has not worn them for a long time.  Ronni Colace, RN from wound notified and aware patient stated "she does not want to wear ulna boots at this time."  This RN and Student RN Antoinette present for statement.

## 2023-05-12 ENCOUNTER — Inpatient Hospital Stay (HOSPITAL_COMMUNITY)

## 2023-05-12 DIAGNOSIS — I5033 Acute on chronic diastolic (congestive) heart failure: Secondary | ICD-10-CM | POA: Diagnosis not present

## 2023-05-12 DIAGNOSIS — I5031 Acute diastolic (congestive) heart failure: Secondary | ICD-10-CM

## 2023-05-12 DIAGNOSIS — J189 Pneumonia, unspecified organism: Secondary | ICD-10-CM | POA: Diagnosis not present

## 2023-05-12 LAB — CBC
HCT: 35.4 % — ABNORMAL LOW (ref 36.0–46.0)
Hemoglobin: 11.6 g/dL — ABNORMAL LOW (ref 12.0–15.0)
MCH: 28.8 pg (ref 26.0–34.0)
MCHC: 32.8 g/dL (ref 30.0–36.0)
MCV: 87.8 fL (ref 80.0–100.0)
Platelets: 207 10*3/uL (ref 150–400)
RBC: 4.03 MIL/uL (ref 3.87–5.11)
RDW: 17.8 % — ABNORMAL HIGH (ref 11.5–15.5)
WBC: 6.5 10*3/uL (ref 4.0–10.5)
nRBC: 0 % (ref 0.0–0.2)

## 2023-05-12 LAB — ECHOCARDIOGRAM COMPLETE
Area-P 1/2: 2.69 cm2
Height: 64 in
S' Lateral: 2.3 cm
Weight: 2755.2 [oz_av]

## 2023-05-12 LAB — COMPREHENSIVE METABOLIC PANEL WITH GFR
ALT: 45 U/L — ABNORMAL HIGH (ref 0–44)
AST: 41 U/L (ref 15–41)
Albumin: 3.1 g/dL — ABNORMAL LOW (ref 3.5–5.0)
Alkaline Phosphatase: 229 U/L — ABNORMAL HIGH (ref 38–126)
Anion gap: 12 (ref 5–15)
BUN: 20 mg/dL (ref 8–23)
CO2: 22 mmol/L (ref 22–32)
Calcium: 9.3 mg/dL (ref 8.9–10.3)
Chloride: 105 mmol/L (ref 98–111)
Creatinine, Ser: 1.38 mg/dL — ABNORMAL HIGH (ref 0.44–1.00)
GFR, Estimated: 41 mL/min — ABNORMAL LOW (ref >60)
Glucose, Bld: 132 mg/dL — ABNORMAL HIGH (ref 70–99)
Potassium: 3.1 mmol/L — ABNORMAL LOW (ref 3.5–5.1)
Sodium: 139 mmol/L (ref 135–145)
Total Bilirubin: 0.7 mg/dL (ref 0.0–1.2)
Total Protein: 6.2 g/dL — ABNORMAL LOW (ref 6.5–8.1)

## 2023-05-12 LAB — LIPID PANEL
Cholesterol: 192 mg/dL (ref 0–200)
HDL: 52 mg/dL (ref >40)
LDL Cholesterol: 118 mg/dL — ABNORMAL HIGH (ref 0–99)
Total CHOL/HDL Ratio: 3.7 ratio
Triglycerides: 111 mg/dL (ref <150)
VLDL: 22 mg/dL (ref 0–40)

## 2023-05-12 LAB — PHOSPHORUS: Phosphorus: 3.8 mg/dL (ref 2.5–4.6)

## 2023-05-12 LAB — MAGNESIUM: Magnesium: 1.7 mg/dL (ref 1.7–2.4)

## 2023-05-12 MED ORDER — PERFLUTREN LIPID MICROSPHERE
1.0000 mL | INTRAVENOUS | Status: AC | PRN
  Administered 2023-05-12: 2 mL via INTRAVENOUS

## 2023-05-12 MED ORDER — MAGNESIUM SULFATE 2 GM/50ML IV SOLN
2.0000 g | Freq: Once | INTRAVENOUS | Status: AC
  Administered 2023-05-12: 2 g via INTRAVENOUS
  Filled 2023-05-12: qty 50

## 2023-05-12 MED ORDER — HYDROXYZINE HCL 25 MG PO TABS
25.0000 mg | ORAL_TABLET | Freq: Three times a day (TID) | ORAL | Status: DC | PRN
  Administered 2023-05-12: 25 mg via ORAL
  Filled 2023-05-12: qty 1

## 2023-05-12 MED ORDER — POTASSIUM CHLORIDE CRYS ER 20 MEQ PO TBCR
40.0000 meq | EXTENDED_RELEASE_TABLET | Freq: Once | ORAL | Status: AC
  Administered 2023-05-12: 40 meq via ORAL
  Filled 2023-05-12: qty 4

## 2023-05-12 MED ORDER — POTASSIUM CHLORIDE CRYS ER 20 MEQ PO TBCR
40.0000 meq | EXTENDED_RELEASE_TABLET | Freq: Once | ORAL | Status: AC
  Administered 2023-05-12: 40 meq via ORAL
  Filled 2023-05-12: qty 2

## 2023-05-12 NOTE — Progress Notes (Addendum)
 Patient Name: Christine Jacobson Date of Encounter: 05/12/2023 [facility] Cardiologist: Liam Cammarata, DO   Interval Summary  .    Patient reports generally feeling well today. Notes improvement in breathing with diuresis.   Vital Signs .    Vitals:   05/12/23 0114 05/12/23 0520 05/12/23 0821 05/12/23 0914  BP: 108/82 99/77 101/67 101/67  Pulse: 84 72  76  Resp: 17 17 18    Temp: 97.8 F (36.6 C) 97.7 F (36.5 C) 98.7 F (37.1 C)   TempSrc: Oral Oral Oral   SpO2: 94% 94% 98%   Weight:  78.1 kg    Height:        Intake/Output Summary (Last 24 hours) at 05/12/2023 0915 Last data filed at 05/12/2023 0119 Gross per 24 hour  Intake 123.56 ml  Output 1450 ml  Net -1326.44 ml      05/12/2023    5:20 AM 05/10/2023    4:33 PM 03/06/2023    3:19 PM  Last 3 Weights  Weight (lbs) 172 lb 3.2 oz 185 lb 192 lb 10.9 oz  Weight (kg) 78.109 kg 83.915 kg 87.4 kg      Telemetry/ECG    Persistent afib, Rates generally well controlled with isolated RVR - Personally Reviewed  Physical Exam .   GEN: No acute distress.   Neck: No JVD Cardiac: irregularly irregular, 3/6 systolic murmur over apex Respiratory: Clear to auscultation bilaterally. GI: Soft, nontender, non-distended  MS: significant mixed edema in bilateral LE to knee   Assessment & Plan .   Christine Jacobson is a 68 y.o. female with a hx of HFpEF, HCM, pulmonary HTN, persistent atrial fibrillation status post prior DCCV, HTN who is being seen 05/11/2023 for the evaluation of CHF at the request of Dr. Carlette Cheers.   Acute on chronic HFpEF HOCM Pulmonary hypertension Patient with HFpEF presented to the ED on 04/23 with symptoms consistent with CHF exacerbation: dyspnea, edema, orthopnea. These symptoms started after patient ran out of her medications. Admission labs found BNP 499. Last echo from December 2024 showed LVEF 65-70%.  S/P IV lasix , patient net negative 1.3L yesterday. Continues with peripheral edema.  Creatinine did rise 1.11->1.38, will follow closely with plans for additional diuresis. K low today at 3.1. Continue Toprol  XL 25mg . Ideally would add spironolactone but limited by BP today. Plan to start SGLT2 prior to d/c  Rate controlled persistent afib Patient with pre-established rate control strategy. Generally stable rates this admission. Continue Toprol  XL 25mg  Continue Eliquis  5mg  BID Could consider TEE/DCCV upon reaching euvolemia though with MR, likely to have recurrent afib.  Mitral regurgitation Patient with moderate to severe MR on December 2024 TTE. Repeat echo pending  Hyperlipidemia Continue home statin. No recent lipid panel, add on ordered today.   Lab Results  Component Value Date   CHOL 228 (H) 06/03/2022   HDL 48 06/03/2022   LDLCALC 146 (H) 06/03/2022   LDLDIRECT 121.8 05/26/2011   TRIG 172 (H) 06/03/2022   CHOLHDL 4.8 06/03/2022     For questions or updates, please contact [facility] Please consult [email or web address] for contact info under    Signed, Leala Prince, PA-C   Patient seen and examined, note reviewed with the signed Advanced Practice Provider. I personally reviewed laboratory data, imaging studies and relevant notes. I independently examined the patient and formulated the important aspects of the plan. I have personally discussed the plan with the patient and/or family. Comments or changes to the note/plan are indicated  below.  Acute on chronic heart failure with preserved ejection fraction Hypertrophic cardiomyopathy Pulmonary hypertension Persistent atrial fibrillation Mitral regurgitation Hyperlipidemia  Clinically she still appears to be volume overloaded she can benefit from IV Lasix  for the next day or 2.   Continue her Toprol -XL 25 mg she is rate controlled on this, continue her current dose of Eliquis  5 mg twice daily.  In terms of her atrial fibrillation she has had multiple cardioversions but unfortunately she is  significantly symptomatic when she is in A-fib and would often go back into decompensated heart failure.  Therefore she will benefit from TEE cardioversion once she is diuresed.  This can also be considered in the outpatient setting she follows with me in the clinic.  Echocardiogram is pending for today.   Jhada Risk DO, MS [facility] Attending Cardiologist [facility]  [address] [place], [place] [address] [phone] Website: [email or web address]

## 2023-05-12 NOTE — Progress Notes (Signed)
  Progress Note   Patient: Christine Jacobson NWG:[id number] DOB: 07/14/1955 DOA: 05/10/2023     2 DOS: the patient was seen and examined on 05/12/2023    Assessment and Plan: Acute on chronic HFpEF - IV lasix  40 mg q12  - Cardio following  - ECHO remains pending    Chronic afib - Eliquis  5 mg PO bid  - Toprol  XL 25 mg PO daily    Pulmonary HTN  - Management as above  - Albuterol  q2 hr PRN    CKD3a - Monitor    5. B/l lower leg cellulitis  - IV ceftriaxone  1 g daily   Subjective: Pt seen and examined at the bedside. Breathing is improved. Still has peripheral edema which is also improving. Lipid panel pending. Continue with IV lasix .  Physical Exam: Vitals:   05/12/23 0114 05/12/23 0520 05/12/23 0821 05/12/23 0914  BP: 108/82 99/77 101/67 101/67  Pulse: 84 72  76  Resp: 17 17 18    Temp: 97.8 F (36.6 C) 97.7 F (36.5 C) 98.7 F (37.1 C)   TempSrc: Oral Oral Oral   SpO2: 94% 94% 98%   Weight:  78.1 kg    Height:       HENT:     Head: Normocephalic.  Cardiovascular:     Rate and Rhythm: Normal rate.  Pulmonary:     Effort: Pulmonary effort is normal.  Abdominal:     Palpations: Abdomen is soft.  Musculoskeletal:     Comments: 2+ pitting edema b/l legs with L >R cellulitis   Skin:    Findings: Rash present.  Neurological:     Mental Status: She is alert and oriented to person, place, and time.  Psychiatric:        Mood and Affect: Mood normal.     Disposition: Status is: Inpatient Remains inpatient appropriate because: IV lasix   Planned Discharge Destination: Barriers to discharge: As above    Time spent: 35 minutes  Author: Khai Torbert , MD 05/12/2023 10:00 AM  For on call review [email or web address].

## 2023-05-12 NOTE — TOC CM/SW Note (Signed)
 Transition of Care [facility]) - Inpatient Brief Assessment   Patient Details  Name: Christine Jacobson MRN: [id number] Date of Birth: 06/27/1955  Transition of Care [facility]) CM/SW Contact:    Jennett Model, RN Phone Number: 05/12/2023, 4:17 PM   Clinical Narrative: From home alone, indep, has PCP and insurance on file, states has no HH services in place at this time , she did have a HHRN in the past with [facility]st , has a wlker at home.  States family member or friend will transport them home at [employer] and family is support system, states she would like TOC pharmacy to fill her meds before discharge.  Pta self ambulatory .  She gives this NCM permission to speak with Luana Rumple.     Transition of Care Asessment: Insurance and Status: Insurance coverage has been reviewed Patient has primary care physician: Yes Home environment has been reviewed: home alone Prior level of function:: indep Prior/Current Home Services: No current home services Social Drivers of Health Review: SDOH reviewed no interventions necessary Readmission risk has been reviewed: Yes Transition of care needs: no transition of care needs at this time

## 2023-05-12 NOTE — Plan of Care (Signed)
   Problem: Activity: Goal: Risk for activity intolerance will decrease Outcome: Progressing

## 2023-05-13 ENCOUNTER — Other Ambulatory Visit (HOSPITAL_COMMUNITY): Payer: Self-pay

## 2023-05-13 DIAGNOSIS — R0602 Shortness of breath: Secondary | ICD-10-CM

## 2023-05-13 DIAGNOSIS — I5033 Acute on chronic diastolic (congestive) heart failure: Secondary | ICD-10-CM | POA: Diagnosis not present

## 2023-05-13 DIAGNOSIS — J189 Pneumonia, unspecified organism: Secondary | ICD-10-CM | POA: Diagnosis not present

## 2023-05-13 LAB — COMPREHENSIVE METABOLIC PANEL WITH GFR
ALT: 56 U/L — ABNORMAL HIGH (ref 0–44)
AST: 62 U/L — ABNORMAL HIGH (ref 15–41)
Albumin: 3.1 g/dL — ABNORMAL LOW (ref 3.5–5.0)
Alkaline Phosphatase: 264 U/L — ABNORMAL HIGH (ref 38–126)
Anion gap: 12 (ref 5–15)
BUN: 22 mg/dL (ref 8–23)
CO2: 23 mmol/L (ref 22–32)
Calcium: 9.4 mg/dL (ref 8.9–10.3)
Chloride: 105 mmol/L (ref 98–111)
Creatinine, Ser: 1.42 mg/dL — ABNORMAL HIGH (ref 0.44–1.00)
GFR, Estimated: 40 mL/min — ABNORMAL LOW (ref >60)
Glucose, Bld: 109 mg/dL — ABNORMAL HIGH (ref 70–99)
Potassium: 3.7 mmol/L (ref 3.5–5.1)
Sodium: 140 mmol/L (ref 135–145)
Total Bilirubin: 0.7 mg/dL (ref 0.0–1.2)
Total Protein: 6.2 g/dL — ABNORMAL LOW (ref 6.5–8.1)

## 2023-05-13 LAB — CBC
HCT: 36 % (ref 36.0–46.0)
Hemoglobin: 11.7 g/dL — ABNORMAL LOW (ref 12.0–15.0)
MCH: 28.7 pg (ref 26.0–34.0)
MCHC: 32.5 g/dL (ref 30.0–36.0)
MCV: 88.5 fL (ref 80.0–100.0)
Platelets: 216 10*3/uL (ref 150–400)
RBC: 4.07 MIL/uL (ref 3.87–5.11)
RDW: 17.7 % — ABNORMAL HIGH (ref 11.5–15.5)
WBC: 6.3 10*3/uL (ref 4.0–10.5)
nRBC: 0 % (ref 0.0–0.2)

## 2023-05-13 LAB — PHOSPHORUS: Phosphorus: 4.3 mg/dL (ref 2.5–4.6)

## 2023-05-13 LAB — MAGNESIUM: Magnesium: 2 mg/dL (ref 1.7–2.4)

## 2023-05-13 MED ORDER — FUROSEMIDE 40 MG PO TABS
40.0000 mg | ORAL_TABLET | Freq: Every day | ORAL | 1 refills | Status: DC
  Filled 2023-05-13: qty 30, 30d supply, fill #0

## 2023-05-13 MED ORDER — AMOXICILLIN-POT CLAVULANATE 500-125 MG PO TABS
500.0000 mg | ORAL_TABLET | Freq: Two times a day (BID) | ORAL | 0 refills | Status: AC
  Filled 2023-05-13: qty 8, 4d supply, fill #0

## 2023-05-13 NOTE — Discharge Summary (Signed)
 Physician Discharge Summary   Patient: Christine Jacobson MRN: [id number] DOB: 09/19/1955  Admit date:     05/10/2023  Discharge date: 05/13/23  Discharge Physician: Mickle Albe    PCP: Terry Ficks, NP      Discharge Diagnoses: Active Problems:   Shortness of breath   Acute on chronic diastolic CHF (congestive heart failure) (HCC)  Principal Problem (Resolved):   CAP (community acquired pneumonia)  Hospital Course: 68 yo F treated for acute on chronic HFpEF exacerbation (in the setting of hypertrophic obstructive cardiomyopathy pulmonary HTN) and b/l lower leg cellulitis. Cardiology was consulted. She received IV lasix  40 mg q12 and IV ceftriaxone  1 g daily.  ECHO showed LVEF 60-65% and LV had no regional wall motion abnormalities. Cardiology advised pt can transition back to lasix  40 mg PO daily, resume her statin as an outpt. Lastly, she will go home with scripts for renally reduced PO antibx to complete the course at home.    DISCHARGE MEDICATION: Allergies as of 05/13/2023       Reactions   Pork-derived Products Other (See Comments)   Religious preference        Medication List     TAKE these medications    allopurinol  100 MG tablet Commonly known as: ZYLOPRIM  Take 0.5 tablets (50 mg total) by mouth daily for gout   amoxicillin -clavulanate 500-125 MG tablet Commonly known as: Augmentin  Take 1 tablet by mouth 2 (two) times daily for 4 days.   ascorbic acid  500 MG tablet Commonly known as: VITAMIN C  Take 1 tablet (500 mg total) by mouth daily for 30 days   atorvastatin  80 MG tablet Commonly known as: LIPITOR Take 1 tablet (80 mg total) by mouth daily.   Eliquis  5 MG Tabs tablet Generic drug: apixaban  Take 1 tablet (5 mg total) by mouth 2 (two) times daily.   escitalopram  5 MG tablet Commonly known as: LEXAPRO  Take 1 tablet (5 mg total) by mouth daily.   furosemide  40 MG tablet Commonly known as: Lasix  Take 1 tablet (40 mg total) by mouth daily.  For weight gains more than 3 pounds in 1 day or 5 pounds in 1 week. Stop torsemide  What changed:  when to take this reasons to take this   metoprolol  succinate 25 MG 24 hr tablet Commonly known as: TOPROL -XL Take 1 tablet (25 mg total) by mouth daily.   pantoprazole  40 MG tablet Commonly known as: PROTONIX  Take 1 tablet (40 mg total) by mouth daily. What changed: Another medication with the same name was removed. Continue taking this medication, and follow the directions you see here.   Potassium Chloride  ER 20 MEQ Tbcr Take 1 tablet (20 mEq total) by mouth every other day. What changed: Another medication with the same name was removed. Continue taking this medication, and follow the directions you see here.   predniSONE  10 MG tablet Commonly known as: DELTASONE  Day 1: Take 2 tablets by mouth at breakfast, 1 at lunch and 1 at dinner, and 2 at bedtime.  Day 2: Take 1 tablet by mouth at breakfast, 1 at lunch and 1 at dinner, and 2 tablets at bedtime.  Day 3: Take 1 tablet at breakfast, 1 at lunch, and 1 at dinner and 1 at bedtime.  Day 4: Take 1 tab by mouth at breakfast, 1 at lunch and 1 at bedtime.  Day 5: Take 1 tab at breakfast and 1 at bedtime.  Day 6: Take 1 tab at breakfast.   QUEtiapine  25 MG  tablet Commonly known as: SEROquel  Take 0.5 tablets (12.5 mg total) by mouth at bedtime for 7 days, THEN 1 tablet (25 mg total) at bedtime for mood and insomnia. Start taking on: April 20, 2023   sertraline  50 MG tablet Commonly known as: Zoloft  Take 0.5 tablets (25 mg total) by mouth daily for 7 days, THEN 1 tablet (50 mg total) daily. Start taking on: April 20, 2023   Zinc  Sulfate 220 (50 Zn) MG Tabs Take 1 tablet (220 mg total) by mouth daily for 30 days        Discharge Exam: Filed Weights   05/10/23 1633 05/12/23 0520 05/13/23 0509  Weight: 83.9 kg 78.1 kg 79.2 kg   Physical Exam HENT:     Head: Normocephalic.  Cardiovascular:     Rate and Rhythm: Normal rate and regular  rhythm.  Pulmonary:     Effort: Pulmonary effort is normal.  Abdominal:     Palpations: Abdomen is soft.  Musculoskeletal:        General: Normal range of motion.     Comments: Lower leg erythema improved  Skin:    General: Skin is warm.  Neurological:     Mental Status: She is alert.  Psychiatric:        Mood and Affect: Mood normal.      Condition at discharge: fair  The results of significant diagnostics from this hospitalization (including imaging, microbiology, ancillary and laboratory) are listed below for reference.   Imaging Studies: ECHOCARDIOGRAM COMPLETE Result Date: 05/12/2023    ECHOCARDIOGRAM REPORT   Patient Name:   Christine Jacobson Date of Exam: 05/12/2023 Medical Rec #:  [id number]          Height:       64.0 in Accession #:    [id number]         Weight:       172.2 lb Date of Birth:  June 24, 1955          BSA:          1.836 m Patient Age:    68 years           BP:           99/77 mmHg Patient Gender: F                  HR:           74 bpm. Exam Location:  Inpatient Procedure: 2D Echo, Cardiac Doppler, Color Doppler and Intracardiac            Opacification Agent (Both Spectral and Color Flow Doppler were            utilized during procedure). Indications:    CHF-Acute Diastolic  History:        Patient has prior history of Echocardiogram examinations. CHF,                 Signs/Symptoms:Shortness of Breath and Chest Pain; Risk                 Factors:Former Smoker and Hypertension.  Sonographer:    Willey Harrier Referring Phys: [id number] SARA-MAIZ A THOMAS  Sonographer Comments: Technically difficult study due to poor echo windows. Image acquisition challenging due to patient body habitus. IMPRESSIONS  1. Hypertrophic cardiomyopathy with severe consentric hypertrophy and prominence of basilar septum (2.3cm). LVOT gradeint measured at at rest. . Left ventricular ejection fraction, by estimation, is 60 to 65%. The left ventricle has normal function. The left  ventricle has  no regional wall motion abnormalities. Left ventricular diastolic parameters are indeterminate.  2. Right ventricular systolic function is mildly reduced. The right ventricular size is normal.  3. Left atrial size was severely dilated.  4. Right atrial size was severely dilated.  5. The mitral valve is degenerative. Mild mitral valve regurgitation. No evidence of mitral stenosis. Severe mitral annular calcification.  6. The aortic valve is tricuspid. There is mild calcification of the aortic valve. Aortic valve regurgitation is not visualized. No aortic stenosis is present.  7. The inferior vena cava is dilated in size with <50% respiratory variability, suggesting right atrial pressure of 15 mmHg. FINDINGS  Left Ventricle: Hypertrophic cardiomyopathy with severe consentric hypertrophy and prominence of basilar septum (2.3cm). LVOT gradeint measured at at rest. Left ventricular ejection fraction, by estimation, is 60 to 65%. The left ventricle has normal function. The left ventricle has no regional wall motion abnormalities. The left ventricular internal cavity size was normal in size. There is no left ventricular hypertrophy. Left ventricular diastolic parameters are indeterminate. Right Ventricle: The right ventricular size is normal. No increase in right ventricular wall thickness. Right ventricular systolic function is mildly reduced. Left Atrium: Left atrial size was severely dilated. Right Atrium: Right atrial size was severely dilated. Pericardium: There is no evidence of pericardial effusion. Mitral Valve: The mitral valve is degenerative in appearance. Severe mitral annular calcification. Mild mitral valve regurgitation. No evidence of mitral valve stenosis. MV peak gradient, 7.3 mmHg. The mean mitral valve gradient is 3.0 mmHg. Tricuspid Valve: The tricuspid valve is normal in structure. Tricuspid valve regurgitation is mild . No evidence of tricuspid stenosis. Aortic Valve: The aortic valve is  tricuspid. There is mild calcification of the aortic valve. Aortic valve regurgitation is not visualized. No aortic stenosis is present. Pulmonic Valve: The pulmonic valve was normal in structure. Pulmonic valve regurgitation is not visualized. No evidence of pulmonic stenosis. Aorta: The aortic root is normal in size and structure. Venous: The inferior vena cava is dilated in size with less than 50% respiratory variability, suggesting right atrial pressure of 15 mmHg. IAS/Shunts: No atrial level shunt detected by color flow Doppler.  LEFT VENTRICLE PLAX 2D LVIDd:         2.90 cm LVIDs:         2.30 cm LV PW:         1.50 cm LV IVS:        2.30 cm LVOT diam:     1.90 cm LVOT Area:     2.84 cm  RIGHT VENTRICLE          IVC RV Basal diam:  4.00 cm  IVC diam: 1.90 cm LEFT ATRIUM              Index         RIGHT ATRIUM           Index LA diam:        5.50 cm  3.00 cm/m    RA Area:     28.20 cm LA Vol (A2C):   202.0 ml 110.03 ml/m  RA Volume:   91.70 ml  49.95 ml/m LA Vol (A4C):   183.0 ml 99.68 ml/m LA Biplane Vol: 197.0 ml 107.31 ml/m   AORTA Ao Root diam: 3.20 cm Ao Asc diam:  3.50 cm MITRAL VALVE                TRICUSPID VALVE MV Area (PHT): 2.69 cm  TR Peak grad:   25.0 mmHg MV Peak grad:  7.3 mmHg     TR Vmax:        250.00 cm/s MV Mean grad:  3.0 mmHg MV Vmax:       1.35 m/s     SHUNTS MV Vmean:      70.6 cm/s    Systemic Diam: 1.90 cm MV Decel Time: 282 msec MV E velocity: 118.67 cm/s Jules Oar MD Electronically signed by Jules Oar MD Signature Date/Time: 05/12/2023/11:32:18 AM    Final    CT Angio Chest PE W and/or Wo Contrast Result Date: 05/10/2023 CLINICAL DATA:  Chest pain and shortness of breath. EXAM: CT ANGIOGRAPHY CHEST WITH CONTRAST TECHNIQUE: Multidetector CT imaging of the chest was performed using the standard protocol during bolus administration of intravenous contrast. Multiplanar CT image reconstructions and MIPs were obtained to evaluate the vascular anatomy. RADIATION  DOSE REDUCTION: This exam was performed according to the departmental dose-optimization program which includes automated exposure control, adjustment of the mA and/or kV according to patient size and/or use of iterative reconstruction technique. CONTRAST:  75mL OMNIPAQUE  IOHEXOL  350 MG/ML SOLN COMPARISON:  None Available. FINDINGS: Cardiovascular: Satisfactory opacification of the pulmonary arteries to the segmental level. No evidence of pulmonary embolism. The heart is enlarged. There are atherosclerotic calcifications of the aorta and coronary arteries. Mediastinum/Nodes: There is a hypodense right thyroid nodule measuring 8 mm. There is heterogeneity in left thyroid gland. There is an is cyst nodule measuring up to 15 mm. There are mildly enlarged paratracheal lymph nodes measuring up to 1 cm. There are nonenlarged bilateral hilar and subcarinal lymph nodes. The visualized esophagus is within normal limits. Lungs/Pleura: Diffuse bilateral multifocal confluent areas of ground-glass opacity are seen throughout both lungs. There is no pleural effusion or pneumothorax. Trachea and central airways appear patent. Upper Abdomen: No acute abnormality. Musculoskeletal: No chest wall abnormality. No acute or significant osseous findings. Review of the MIP images confirms the above findings. IMPRESSION: 1. No evidence for pulmonary embolism. 2. Diffuse bilateral multifocal confluent areas of ground-glass opacity throughout both lungs. Findings may be related to pulmonary edema or infection. 3. Cardiomegaly. 4. Mildly enlarged paratracheal lymph nodes, likely reactive. 5. Bilateral thyroid nodules measuring up to 15 mm. Recommend thyroid ultrasound (ref: J Am Coll Radiol. 2015 Feb;12(2): 143-50). . Aortic Atherosclerosis (ICD10-I70.0). Electronically Signed   By: Tyron Gallon M.D.   On: 05/10/2023 22:28   DG Chest Port 1 View Result Date: 05/10/2023 CLINICAL DATA:  Short of breath. Chest pain. Congestive heart failure  EXAM: PORTABLE CHEST 1 VIEW COMPARISON:  06/25/2023 FINDINGS: Stable enlarged cardiac silhouette. No effusion, infiltrate pneumothorax. No acute osseous abnormality. IMPRESSION: Cardiomegaly.  No acute findings Electronically Signed   By: Deboraha Fallow M.D.   On: 05/10/2023 17:23    Microbiology: Results for orders placed or performed during the hospital encounter of 02/14/23  Resp panel by RT-PCR (RSV, Flu A&B, Covid) Anterior Nasal Swab     Status: None   Collection Time: 02/14/23  4:11 PM   Specimen: Anterior Nasal Swab  Result Value Ref Range Status   SARS Coronavirus 2 by RT PCR NEGATIVE NEGATIVE Final    Comment: (NOTE) SARS-CoV-2 target nucleic acids are NOT DETECTED.  The SARS-CoV-2 RNA is generally detectable in upper respiratory specimens during the acute phase of infection. The lowest concentration of SARS-CoV-2 viral copies this assay can detect is 138 copies/mL. A negative result does not preclude SARS-Cov-2 infection and should not be used  as the sole basis for treatment or other patient management decisions. A negative result may occur with  improper specimen collection/handling, submission of specimen other than nasopharyngeal swab, presence of viral mutation(s) within the areas targeted by this assay, and inadequate number of viral copies(<138 copies/mL). A negative result must be combined with clinical observations, patient history, and epidemiological information. The expected result is Negative.  Fact Sheet for Patients:  BloggerCourse.com  Fact Sheet for Healthcare Providers:  SeriousBroker.it  This test is no t yet approved or cleared by the [place]  FDA and  has been authorized for detection and/or diagnosis of SARS-CoV-2 by FDA under an Emergency Use Authorization (EUA). This EUA will remain  in effect (meaning this test can be used) for the duration of the COVID-19 declaration under Section 564(b)(1)  of the Act, 21 U.S.C.section 360bbb-3(b)(1), unless the authorization is terminated  or revoked sooner.       Influenza A by PCR NEGATIVE NEGATIVE Final   Influenza B by PCR NEGATIVE NEGATIVE Final    Comment: (NOTE) The Xpert Xpress SARS-CoV-2/FLU/RSV plus assay is intended as an aid in the diagnosis of influenza from Nasopharyngeal swab specimens and should not be used as a sole basis for treatment. Nasal washings and aspirates are unacceptable for Xpert Xpress SARS-CoV-2/FLU/RSV testing.  Fact Sheet for Patients: BloggerCourse.com  Fact Sheet for Healthcare Providers: SeriousBroker.it  This test is not yet approved or cleared by the [place]  FDA and has been authorized for detection and/or diagnosis of SARS-CoV-2 by FDA under an Emergency Use Authorization (EUA). This EUA will remain in effect (meaning this test can be used) for the duration of the COVID-19 declaration under Section 564(b)(1) of the Act, 21 U.S.C. section 360bbb-3(b)(1), unless the authorization is terminated or revoked.     Resp Syncytial Virus by PCR NEGATIVE NEGATIVE Final    Comment: (NOTE) Fact Sheet for Patients: BloggerCourse.com  Fact Sheet for Healthcare Providers: SeriousBroker.it  This test is not yet approved or cleared by the [place]  FDA and has been authorized for detection and/or diagnosis of SARS-CoV-2 by FDA under an Emergency Use Authorization (EUA). This EUA will remain in effect (meaning this test can be used) for the duration of the COVID-19 declaration under Section 564(b)(1) of the Act, 21 U.S.C. section 360bbb-3(b)(1), unless the authorization is terminated or revoked.  Performed at [facility], [address], [place], [place] [address]     Labs: CBC: Recent Labs  Lab 05/10/23 1645 05/11/23 0450 05/12/23 0329 05/13/23 0314  WBC 5.8 7.1  6.5 6.3  HGB 11.2* 11.7* 11.6* 11.7*  HCT 35.6* 35.5* 35.4* 36.0  MCV 91.0 89.4 87.8 88.5  PLT 174 198 207 216   Basic Metabolic Panel: Recent Labs  Lab 05/10/23 1645 05/11/23 0450 05/12/23 0329 05/13/23 0314  NA 138 141 139 140  K 3.1* 3.0* 3.1* 3.7  CL 113* 106 105 105  CO2 19* 25 22 23   GLUCOSE 99 93 132* 109*  BUN 18 17 20 22   CREATININE 1.12* 1.11* 1.38* 1.42*  CALCIUM  8.9 9.4 9.3 9.4  MG  --  1.8 1.7 2.0  PHOS  --   --  3.8 4.3   Liver Function Tests: Recent Labs  Lab 05/11/23 0450 05/12/23 0329 05/13/23 0314  AST 44* 41 62*  ALT 53* 45* 56*  ALKPHOS 246* 229* 264*  BILITOT 1.0 0.7 0.7  PROT 6.3* 6.2* 6.2*  ALBUMIN  3.3* 3.1* 3.1*   CBG: No results for input(s): "GLUCAP" in  the last 168 hours.  Discharge time spent: greater than 30 minutes.  Signed: Annaleigha Woo , MD [facility] 05/13/2023

## 2023-05-13 NOTE — Progress Notes (Signed)
   Patient Name: Katniss Mchargue Date of Encounter: 05/13/2023 [facility] Cardiologist: Kardie Tobb, DO   Interval Summary  .    Laying comfortably in bed, no significant shortness of breath.  Vital Signs .    Vitals:   05/13/23 0616 05/13/23 0837 05/13/23 0940 05/13/23 0957  BP: 118/78 102/70  116/88  Pulse: 78 83  82  Resp: 20     Temp: 97.6 F (36.4 C) 97.8 F (36.6 C)    TempSrc: Oral Oral    SpO2: 99% 99% 99%   Weight:      Height:        Intake/Output Summary (Last 24 hours) at 05/13/2023 1023 Last data filed at 05/13/2023 0950 Gross per 24 hour  Intake 674.48 ml  Output 850 ml  Net -175.52 ml      05/13/2023    5:09 AM 05/12/2023    5:20 AM 05/10/2023    4:33 PM  Last 3 Weights  Weight (lbs) 174 lb 9.6 oz 172 lb 3.2 oz 185 lb  Weight (kg) 79.198 kg 78.109 kg 83.915 kg      Telemetry/ECG    AFIB 80's - Personally Reviewed  Physical Exam .   GEN: No acute distress.   Neck: No JVD Cardiac: Irregularly irregular, 3/6 systolic murmur, rubs, or gallops.  Respiratory: Clear to auscultation bilaterally. GI: Soft, nontender, non-distended  MS: Bilateral lower extremity edema  Assessment & Plan .     68 year old with acute on chronic diastolic heart failure EF 70%  Hypertrophic obstructive cardiomyopathy pulmonary hypertension - On Toprol -XL 25 mg - Furosemide  40 mg every 12 hours-BMP 499 on admission now 551 with creatinine increased from 1.1-1.4.  Okay to transition back to furosemide  40 mg daily. -Prior gradient 60 - 75 mmHg-previously referred to HOCM clinic as outpatient -Current echo this admission shows EF 65% with LVOT gradient at 36 mmHg at rest with basal septum 2.3 cm and severely dilated atria there was mild mitral valve regurgitation  Persistent atrial fibrillation - Eliquis  5 mg twice a day - Metoprolol  25 mg daily - Symptomatic heart rates currently 82 controlled.  It is unlikely that she will be able to restore sinus rhythm given  her massively dilated left atrium underlying HOCM.   Leg cellulitis - IV ceftriaxone   Elevated LFTs - Statin on hold -I am okay resuming statin as outpatient.  LFTs are only minimally elevated not 3 times the upper limit of normal.  I am okay with discharge from cardiac perspective.  Perhaps she can get p.o. antibiotics for her lower extremity cellulitis.  For questions or updates, please contact [facility] Please consult [email or web address] for contact info under        Signed, Dorothye Gathers, MD

## 2023-05-13 NOTE — Plan of Care (Signed)
  Problem: Education: Goal: Knowledge of General Education information will improve Description: Including pain rating scale, medication(s)/side effects and non-pharmacologic comfort measures Outcome: Progressing   Problem: Health Behavior/Discharge Planning: Goal: Ability to manage health-related needs will improve Outcome: Progressing   Problem: Clinical Measurements: Goal: Ability to maintain clinical measurements within normal limits will improve Outcome: Progressing Goal: Will remain free from infection Outcome: Progressing Goal: Diagnostic test results will improve Outcome: Progressing Goal: Respiratory complications will improve Outcome: Progressing Goal: Cardiovascular complication will be avoided Outcome: Progressing   Problem: Activity: Goal: Risk for activity intolerance will decrease Outcome: Not Progressing   Problem: Nutrition: Goal: Adequate nutrition will be maintained Outcome: Progressing   Problem: Coping: Goal: Level of anxiety will decrease Outcome: Progressing   Problem: Elimination: Goal: Will not experience complications related to bowel motility Outcome: Progressing Goal: Will not experience complications related to urinary retention Outcome: Progressing   Problem: Pain Managment: Goal: General experience of comfort will improve and/or be controlled Outcome: Progressing

## 2023-05-23 ENCOUNTER — Other Ambulatory Visit (HOSPITAL_COMMUNITY): Payer: Self-pay

## 2023-05-31 ENCOUNTER — Other Ambulatory Visit: Payer: Self-pay

## 2023-05-31 MED ORDER — POTASSIUM CHLORIDE ER 20 MEQ PO TBCR: 20.0000 meq | EXTENDED_RELEASE_TABLET | ORAL | 2 refills | Status: AC

## 2023-06-16 ENCOUNTER — Other Ambulatory Visit: Payer: Self-pay | Admitting: Cardiology

## 2023-06-16 MED ORDER — FUROSEMIDE 40 MG PO TABS: 40.0000 mg | ORAL_TABLET | Freq: Every day | ORAL | 1 refills | Status: DC

## 2023-06-16 MED ORDER — METOPROLOL SUCCINATE ER 25 MG PO TB24: 25.0000 mg | ORAL_TABLET | Freq: Every day | ORAL | 1 refills | Status: DC

## 2023-06-16 MED ORDER — APIXABAN 5 MG PO TABS: 5.0000 mg | ORAL_TABLET | Freq: Two times a day (BID) | ORAL | 5 refills | Status: DC

## 2023-06-16 NOTE — Telephone Encounter (Signed)
 Prescription refill request for Eliquis  received. Indication:afib Last office visit:11/24 Scr:1.42  4/25 Age: 68 Weight:79.2  kg  Prescription refilled

## 2023-06-16 NOTE — Telephone Encounter (Signed)
 Pt's medications were sent to pt's pharmacy as requested. Confirmation received.

## 2023-06-16 NOTE — Telephone Encounter (Signed)
*  STAT* If patient is at the pharmacy, call can be transferred to refill team.   1. Which medications need to be refilled? (please list name of each medication and dose if known)   furosemide  (LASIX ) 40 MG tablet (Expired)  apixaban  (ELIQUIS ) 5 MG TABS tablet    metoprolol  succinate (TOPROL -XL) 25 MG 24 hr tablet  2. Which pharmacy/location (including street and city if local pharmacy) is medication to be sent to? [facility] - [place] - [address] Phone: [phone]  Fax: [phone]     3. Do they need a 30 day or 90 day supply? 30

## 2023-07-03 ENCOUNTER — Ambulatory Visit: Admitting: Cardiology

## 2023-07-13 ENCOUNTER — Other Ambulatory Visit (HOSPITAL_COMMUNITY): Payer: Self-pay

## 2023-07-13 MED ORDER — PANTOPRAZOLE SODIUM 40 MG PO TBEC
40.0000 mg | DELAYED_RELEASE_TABLET | Freq: Every morning | ORAL | 3 refills | Status: DC
  Filled 2023-07-13: qty 90, 90d supply, fill #0

## 2023-07-14 ENCOUNTER — Other Ambulatory Visit (HOSPITAL_COMMUNITY): Payer: Self-pay

## 2023-07-14 ENCOUNTER — Other Ambulatory Visit: Payer: Self-pay

## 2023-07-14 MED ORDER — ALLOPURINOL 100 MG PO TABS
100.0000 mg | ORAL_TABLET | Freq: Every day | ORAL | 3 refills | Status: DC
  Filled 2023-07-14: qty 90, 90d supply, fill #0

## 2023-07-25 ENCOUNTER — Other Ambulatory Visit (HOSPITAL_COMMUNITY): Payer: Self-pay

## 2023-09-14 ENCOUNTER — Other Ambulatory Visit (HOSPITAL_COMMUNITY): Payer: Self-pay

## 2023-09-14 ENCOUNTER — Other Ambulatory Visit: Payer: Self-pay

## 2023-09-19 ENCOUNTER — Other Ambulatory Visit (HOSPITAL_COMMUNITY): Payer: Self-pay

## 2023-10-11 ENCOUNTER — Inpatient Hospital Stay (HOSPITAL_COMMUNITY)
Admission: EM | Admit: 2023-10-11 | Discharge: 2023-10-15 | DRG: 286 | Disposition: A | Discharge status: Home / Self Care | Attending: Internal Medicine | Admitting: Internal Medicine

## 2023-10-11 ENCOUNTER — Emergency Department (HOSPITAL_COMMUNITY)

## 2023-10-11 ENCOUNTER — Encounter (HOSPITAL_COMMUNITY): Payer: Self-pay | Admitting: Internal Medicine

## 2023-10-11 ENCOUNTER — Other Ambulatory Visit: Payer: Self-pay

## 2023-10-11 DIAGNOSIS — Z79899 Other long term (current) drug therapy: Secondary | ICD-10-CM

## 2023-10-11 DIAGNOSIS — I5082 Biventricular heart failure: Secondary | ICD-10-CM | POA: Diagnosis present

## 2023-10-11 DIAGNOSIS — F32A Depression, unspecified: Secondary | ICD-10-CM | POA: Diagnosis present

## 2023-10-11 DIAGNOSIS — D6859 Other primary thrombophilia: Secondary | ICD-10-CM | POA: Diagnosis present

## 2023-10-11 DIAGNOSIS — I50813 Acute on chronic right heart failure: Secondary | ICD-10-CM | POA: Diagnosis not present

## 2023-10-11 DIAGNOSIS — I2722 Pulmonary hypertension due to left heart disease: Secondary | ICD-10-CM | POA: Diagnosis present

## 2023-10-11 DIAGNOSIS — R7989 Other specified abnormal findings of blood chemistry: Secondary | ICD-10-CM | POA: Diagnosis not present

## 2023-10-11 DIAGNOSIS — R079 Chest pain, unspecified: Secondary | ICD-10-CM | POA: Diagnosis present

## 2023-10-11 DIAGNOSIS — I110 Hypertensive heart disease with heart failure: Secondary | ICD-10-CM | POA: Diagnosis present

## 2023-10-11 DIAGNOSIS — F419 Anxiety disorder, unspecified: Secondary | ICD-10-CM | POA: Diagnosis present

## 2023-10-11 DIAGNOSIS — E669 Obesity, unspecified: Secondary | ICD-10-CM | POA: Diagnosis present

## 2023-10-11 DIAGNOSIS — K219 Gastro-esophageal reflux disease without esophagitis: Secondary | ICD-10-CM | POA: Diagnosis present

## 2023-10-11 DIAGNOSIS — E7800 Pure hypercholesterolemia, unspecified: Secondary | ICD-10-CM | POA: Diagnosis present

## 2023-10-11 DIAGNOSIS — D649 Anemia, unspecified: Secondary | ICD-10-CM | POA: Diagnosis present

## 2023-10-11 DIAGNOSIS — I5033 Acute on chronic diastolic (congestive) heart failure: Secondary | ICD-10-CM | POA: Diagnosis present

## 2023-10-11 DIAGNOSIS — G8929 Other chronic pain: Secondary | ICD-10-CM | POA: Diagnosis present

## 2023-10-11 DIAGNOSIS — R9439 Abnormal result of other cardiovascular function study: Secondary | ICD-10-CM | POA: Diagnosis not present

## 2023-10-11 DIAGNOSIS — I272 Pulmonary hypertension, unspecified: Secondary | ICD-10-CM | POA: Diagnosis present

## 2023-10-11 DIAGNOSIS — Z825 Family history of asthma and other chronic lower respiratory diseases: Secondary | ICD-10-CM | POA: Diagnosis not present

## 2023-10-11 DIAGNOSIS — Z8041 Family history of malignant neoplasm of ovary: Secondary | ICD-10-CM | POA: Diagnosis not present

## 2023-10-11 DIAGNOSIS — I4819 Other persistent atrial fibrillation: Secondary | ICD-10-CM | POA: Diagnosis present

## 2023-10-11 DIAGNOSIS — Z91148 Patient's other noncompliance with medication regimen for other reason: Secondary | ICD-10-CM | POA: Diagnosis not present

## 2023-10-11 DIAGNOSIS — E785 Hyperlipidemia, unspecified: Secondary | ICD-10-CM | POA: Diagnosis present

## 2023-10-11 DIAGNOSIS — I5031 Acute diastolic (congestive) heart failure: Secondary | ICD-10-CM | POA: Diagnosis present

## 2023-10-11 DIAGNOSIS — R0602 Shortness of breath: Secondary | ICD-10-CM | POA: Diagnosis present

## 2023-10-11 DIAGNOSIS — I509 Heart failure, unspecified: Principal | ICD-10-CM

## 2023-10-11 DIAGNOSIS — F17210 Nicotine dependence, cigarettes, uncomplicated: Secondary | ICD-10-CM | POA: Diagnosis present

## 2023-10-11 DIAGNOSIS — I2489 Other forms of acute ischemic heart disease: Secondary | ICD-10-CM | POA: Diagnosis present

## 2023-10-11 DIAGNOSIS — F329 Major depressive disorder, single episode, unspecified: Secondary | ICD-10-CM | POA: Diagnosis not present

## 2023-10-11 DIAGNOSIS — Z833 Family history of diabetes mellitus: Secondary | ICD-10-CM | POA: Diagnosis not present

## 2023-10-11 DIAGNOSIS — Z7901 Long term (current) use of anticoagulants: Secondary | ICD-10-CM

## 2023-10-11 DIAGNOSIS — E782 Mixed hyperlipidemia: Secondary | ICD-10-CM | POA: Diagnosis present

## 2023-10-11 DIAGNOSIS — Z8249 Family history of ischemic heart disease and other diseases of the circulatory system: Secondary | ICD-10-CM | POA: Diagnosis not present

## 2023-10-11 DIAGNOSIS — I421 Obstructive hypertrophic cardiomyopathy: Secondary | ICD-10-CM | POA: Diagnosis present

## 2023-10-11 DIAGNOSIS — I2510 Atherosclerotic heart disease of native coronary artery without angina pectoris: Secondary | ICD-10-CM | POA: Diagnosis present

## 2023-10-11 DIAGNOSIS — I10 Essential (primary) hypertension: Secondary | ICD-10-CM | POA: Diagnosis not present

## 2023-10-11 LAB — COMPREHENSIVE METABOLIC PANEL WITH GFR
ALT: 29 U/L (ref 0–44)
AST: 46 U/L — ABNORMAL HIGH (ref 15–41)
Albumin: 3.8 g/dL (ref 3.5–5.0)
Alkaline Phosphatase: 189 U/L — ABNORMAL HIGH (ref 38–126)
Anion gap: 12 (ref 5–15)
BUN: 24 mg/dL — ABNORMAL HIGH (ref 8–23)
CO2: 20 mmol/L — ABNORMAL LOW (ref 22–32)
Calcium: 9.9 mg/dL (ref 8.9–10.3)
Chloride: 109 mmol/L (ref 98–111)
Creatinine, Ser: 1.01 mg/dL — ABNORMAL HIGH (ref 0.44–1.00)
GFR, Estimated: 60 mL/min — ABNORMAL LOW (ref >60)
Glucose, Bld: 103 mg/dL — ABNORMAL HIGH (ref 70–99)
Potassium: 4.8 mmol/L (ref 3.5–5.1)
Sodium: 141 mmol/L (ref 135–145)
Total Bilirubin: 1 mg/dL (ref 0.0–1.2)
Total Protein: 6.3 g/dL — ABNORMAL LOW (ref 6.5–8.1)

## 2023-10-11 LAB — CBC WITH DIFFERENTIAL/PLATELET
Abs Immature Granulocytes: 0.04 K/uL (ref 0.00–0.07)
Basophils Absolute: 0.1 K/uL (ref 0.0–0.1)
Basophils Relative: 2 %
Eosinophils Absolute: 0.2 K/uL (ref 0.0–0.5)
Eosinophils Relative: 4 %
HCT: 36.1 % (ref 36.0–46.0)
Hemoglobin: 11.1 g/dL — ABNORMAL LOW (ref 12.0–15.0)
Immature Granulocytes: 1 %
Lymphocytes Relative: 16 %
Lymphs Abs: 0.9 K/uL (ref 0.7–4.0)
MCH: 29.2 pg (ref 26.0–34.0)
MCHC: 30.7 g/dL (ref 30.0–36.0)
MCV: 95 fL (ref 80.0–100.0)
Monocytes Absolute: 0.3 K/uL (ref 0.1–1.0)
Monocytes Relative: 6 %
Neutro Abs: 4.3 K/uL (ref 1.7–7.7)
Neutrophils Relative %: 71 %
Platelets: 195 K/uL (ref 150–400)
RBC: 3.8 MIL/uL — ABNORMAL LOW (ref 3.87–5.11)
RDW: 16.5 % — ABNORMAL HIGH (ref 11.5–15.5)
WBC: 5.9 K/uL (ref 4.0–10.5)
nRBC: 0 % (ref 0.0–0.2)

## 2023-10-11 LAB — TROPONIN T, HIGH SENSITIVITY
Troponin T High Sensitivity: 35 ng/L — ABNORMAL HIGH (ref 0–19)
Troponin T High Sensitivity: 34 ng/L — ABNORMAL HIGH (ref 0–19)

## 2023-10-11 LAB — PRO BRAIN NATRIURETIC PEPTIDE: Pro Brain Natriuretic Peptide: 4647 pg/mL — ABNORMAL HIGH (ref <300.0)

## 2023-10-11 MED ORDER — FUROSEMIDE 10 MG/ML IJ SOLN
40.0000 mg | Freq: Two times a day (BID) | INTRAMUSCULAR | Status: DC
  Administered 2023-10-11 – 2023-10-13 (×4): 40 mg via INTRAVENOUS
  Filled 2023-10-11 (×4): qty 4

## 2023-10-11 MED ORDER — POTASSIUM CHLORIDE ER 20 MEQ PO TBCR: 40.0000 meq | EXTENDED_RELEASE_TABLET | Freq: Every day | ORAL | Status: DC

## 2023-10-11 MED ORDER — ALBUTEROL SULFATE (2.5 MG/3ML) 0.083% IN NEBU: 2.5000 mg | INHALATION_SOLUTION | RESPIRATORY_TRACT | Status: DC | PRN

## 2023-10-11 MED ORDER — PANTOPRAZOLE SODIUM 40 MG PO TBEC
40.0000 mg | DELAYED_RELEASE_TABLET | Freq: Every morning | ORAL | Status: DC
  Administered 2023-10-12 – 2023-10-15 (×3): 40 mg via ORAL
  Filled 2023-10-11 (×3): qty 1

## 2023-10-11 MED ORDER — ONDANSETRON HCL 4 MG/2ML IJ SOLN: 4.0000 mg | Freq: Four times a day (QID) | INTRAMUSCULAR | Status: DC | PRN

## 2023-10-11 MED ORDER — ACETAMINOPHEN 325 MG PO TABS
650.0000 mg | ORAL_TABLET | Freq: Four times a day (QID) | ORAL | Status: DC | PRN
  Administered 2023-10-11: 650 mg via ORAL
  Filled 2023-10-11: qty 2

## 2023-10-11 MED ORDER — APIXABAN 5 MG PO TABS
5.0000 mg | ORAL_TABLET | Freq: Two times a day (BID) | ORAL | Status: DC
Start: 2023-10-11 — End: 2023-10-12
  Administered 2023-10-11 – 2023-10-12 (×2): 5 mg via ORAL
  Filled 2023-10-11 (×2): qty 1

## 2023-10-11 MED ORDER — ATORVASTATIN CALCIUM 40 MG PO TABS
80.0000 mg | ORAL_TABLET | Freq: Every day | ORAL | Status: DC
  Administered 2023-10-12 – 2023-10-15 (×3): 80 mg via ORAL
  Filled 2023-10-11 (×3): qty 2

## 2023-10-11 MED ORDER — METOPROLOL SUCCINATE ER 50 MG PO TB24
25.0000 mg | ORAL_TABLET | Freq: Every day | ORAL | Status: DC
Start: 2023-10-12 — End: 2023-10-12
  Administered 2023-10-12: 25 mg via ORAL
  Filled 2023-10-11: qty 1

## 2023-10-11 MED ORDER — ESCITALOPRAM OXALATE 10 MG PO TABS
5.0000 mg | ORAL_TABLET | Freq: Every day | ORAL | Status: DC
  Administered 2023-10-11 – 2023-10-14 (×4): 5 mg via ORAL
  Filled 2023-10-11 (×4): qty 1

## 2023-10-11 MED ORDER — ACETAMINOPHEN 650 MG RE SUPP: 650.0000 mg | Freq: Four times a day (QID) | RECTAL | Status: DC | PRN

## 2023-10-11 MED ORDER — TRAZODONE HCL 50 MG PO TABS
25.0000 mg | ORAL_TABLET | Freq: Every evening | ORAL | Status: DC | PRN
  Administered 2023-10-11 – 2023-10-14 (×4): 25 mg via ORAL
  Filled 2023-10-11 (×4): qty 1

## 2023-10-11 MED ORDER — FUROSEMIDE 10 MG/ML IJ SOLN
40.0000 mg | Freq: Once | INTRAMUSCULAR | Status: AC
  Administered 2023-10-11: 40 mg via INTRAVENOUS
  Filled 2023-10-11: qty 4

## 2023-10-11 MED ORDER — POTASSIUM CHLORIDE CRYS ER 20 MEQ PO TBCR
40.0000 meq | EXTENDED_RELEASE_TABLET | Freq: Every day | ORAL | Status: DC
  Administered 2023-10-11 – 2023-10-15 (×4): 40 meq via ORAL
  Filled 2023-10-11 (×4): qty 2

## 2023-10-11 MED ORDER — ONDANSETRON HCL 4 MG PO TABS: 4.0000 mg | ORAL_TABLET | Freq: Four times a day (QID) | ORAL | Status: DC | PRN

## 2023-10-11 NOTE — Progress Notes (Signed)
 Patient admitted to the floor from ER. Lower Bilateral Legs knees down noted to have very dry flaky skin. No open areas noted. Please see Media Pictures

## 2023-10-11 NOTE — H&P (Signed)
 History and Physical  Christine [facility] FMW:[id number] DOB: 08-14-1955 DOA: 10/11/2023  PCP: [name], NP   Chief Complaint: Shortness of breath, leg swelling  HPI: [facility] is a 68 y.o. female with medical history significant for hypertension, hyperlipidemia, obesity, heart failure with preserved EF being admitted to the hospital with 2 days of shortness of breath, lower extremity edema and concern for acute on chronic heart failure with preserved EF.  Patient denies any chest pain, cough, orthopnea, fevers, chills or other concerns.  States that she noted 2 to 3 days ago that her legs were starting to get puffy, and over the last 24 hours she has had progressive shortness of breath with exertion.  Evaluation in the ER as detailed below shows evidence of acute heart failure, she was given a dose of IV Lasix  and admitted to the hospitalist service.  Review of Systems: Please see HPI for pertinent positives and negatives. A complete 10 system review of systems are otherwise negative.  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   2D ECHO - EF >55%, normal   HTN (hypertension)    Hyperprolactinemia    dx in her 18, took meds, self d/c a while back   Liver function test abnormality    normal when repeated   Mild hyperlipidemia    Obesity    Palpitations    negative stress echo in November 2012 with normal LV function; mild LVH, proximal septal thickening with narrow LVOT and mild gradient; mild MR and TR; Cardionet showed PACs in November 2012   Pyoderma gangrenosa    Shortness of breath 07/11/2011   MET TEST   Past Surgical History:  Procedure Laterality Date   BIOPSY  08/10/2022   Procedure: BIOPSY;  Surgeon: Burnette Fallow, MD;  Location: [facility];  Service: Gastroenterology;;   CARDIOVERSION N/A 02/01/2022   Procedure: CARDIOVERSION;  Surgeon: Sheena Pugh, DO;  Location: [facility];  Service: Cardiovascular;  Laterality: N/A;   CARDIOVERSION N/A 09/20/2022   Procedure: CARDIOVERSION;  Surgeon: Cherrie Toribio SAUNDERS, MD;  Location: [facility];  Service: Cardiovascular;  Laterality: N/A;   COLONOSCOPY WITH PROPOFOL  Left 08/10/2022   Procedure: COLONOSCOPY WITH PROPOFOL ;  Surgeon: Burnette Fallow, MD;  Location: [facility];  Service: Gastroenterology;  Laterality: Left;   ESOPHAGOGASTRODUODENOSCOPY (EGD) WITH PROPOFOL  Left 08/10/2022   Procedure: ESOPHAGOGASTRODUODENOSCOPY (EGD) WITH PROPOFOL ;  Surgeon: Burnette Fallow, MD;  Location: [facility];  Service: Gastroenterology;  Laterality: Left;   POLYPECTOMY  08/10/2022   Procedure: POLYPECTOMY;  Surgeon: Burnette Fallow, MD;  Location: [facility];  Service: Gastroenterology;;   RIGHT HEART CATH N/A 10/15/2021   Procedure: RIGHT HEART CATH;  Surgeon: Elmira Newman PARAS, MD;  Location: [facility];  Service: Cardiovascular;  Laterality: N/A;   SKIN GRAFT  2006   porcine, R leg   TEE WITHOUT CARDIOVERSION N/A 09/20/2022   Procedure: TRANSESOPHAGEAL ECHOCARDIOGRAM;  Surgeon: Cherrie Toribio SAUNDERS, MD;  Location: [facility];  Service: Cardiovascular;  Laterality: N/A;   Social History:  reports that she has quit smoking. Her smoking use included cigarettes. She has a 4 pack-year smoking history. She has never used smokeless tobacco. She reports that she does not currently use alcohol after a past usage of about 1.0 standard drink of alcohol  per week. She reports that she does not use drugs.  Allergies  Allergen Reactions   Pork-Derived Products Other (See Comments)    Religious preference    Family History  Problem Relation Age of Onset   Heart disease Mother        endocarditis   Ovarian cancer Mother    Emphysema Father    Coronary artery disease Other         F in his 18   Diabetes Other        GP   Cancer Neg Hx      Prior to Admission medications   Medication Sig Start Date End Date Taking? Authorizing Provider  allopurinol  (ZYLOPRIM ) 100 MG tablet Take 0.5 tablets (50 mg total) by mouth daily for gout 03/16/23     allopurinol  (ZYLOPRIM ) 100 MG tablet Take 1 tablet (100 mg total) by mouth daily. 07/14/23     apixaban  (ELIQUIS ) 5 MG TABS tablet Take 1 tablet (5 mg total) by mouth 2 (two) times daily. 06/16/23   Tobb, Kardie, DO  ascorbic acid  (VITAMIN C ) 500 MG tablet Take 1 tablet (500 mg total) by mouth daily for 30 days 06/10/22   [name], MD  atorvastatin  (LIPITOR) 80 MG tablet Take 1 tablet (80 mg total) by mouth daily. 03/21/23     escitalopram  (LEXAPRO ) 5 MG tablet Take 1 tablet (5 mg total) by mouth daily. Patient taking differently: Take 5 mg by mouth at bedtime. 09/22/22   Bryan Bianchi, MD  furosemide  (LASIX ) 40 MG tablet Take 1 tablet (40 mg total) by mouth daily. For weight gains more than 3 pounds in 1 day or 5 pounds in 1 week. Stop torsemide  06/16/23   Tobb, Kardie, DO  metoprolol  succinate (TOPROL -XL) 25 MG 24 hr tablet Take 1 tablet (25 mg total) by mouth daily. 06/16/23   Tobb, Kardie, DO  pantoprazole  (PROTONIX ) 40 MG tablet Take 1 tablet (40 mg total) by mouth daily. 08/19/22   Cindy Garnette POUR, MD  pantoprazole  (PROTONIX ) 40 MG tablet Take 1 tablet (40 mg total) by mouth every morning. 07/13/23     Potassium Chloride  ER 20 MEQ TBCR Take 1 tablet (20 mEq total) by mouth every other day. 05/31/23   Tobb, Kardie, DO  predniSONE  (DELTASONE ) 10 MG tablet Day 1: Take 2 tablets by mouth at breakfast, 1 at lunch and 1 at dinner, and 2 at bedtime.  Day 2: Take 1 tablet by mouth at breakfast, 1 at lunch and 1 at dinner, and 2 tablets at bedtime.  Day 3: Take 1 tablet at breakfast, 1 at lunch, and 1 at dinner and 1 at bedtime.  Day 4: Take 1 tab by mouth at breakfast, 1 at lunch and 1 at bedtime.  Day 5: Take 1 tab at breakfast and 1 at  bedtime.  Day 6: Take 1 tab at breakfast. 03/16/23     Zinc  Sulfate 220 (50 Zn) MG TABS Take 1 tablet (220 mg total) by mouth daily for 30 days 06/10/22   [name], MD  QUEtiapine  (SEROQUEL ) 25 MG tablet Take 0.5 tablets (12.5 mg total) by mouth at bedtime for 7 days, THEN 1 tablet (25 mg total) at bedtime for mood and insomnia. Patient not taking: Reported on 05/11/2023 04/20/23 07/13/23      Physical Exam: BP (!) 147/98   Pulse 78   Temp 97.7 F (36.5 C) (Oral)   Resp 20   SpO2 98%  General:  Alert, oriented, calm, in  no acute distress, resting comfortably on room air.  She looks overall volume overloaded.  Speaking in full sentences. Cardiovascular: RRR, no murmurs or rubs, she has 3+ bilateral lower extremity pitting edema Respiratory: clear to auscultation bilaterally, no wheezes, mild crackles at the bilateral bases Abdomen: soft, nontender, nondistended, normal bowel tones heard  Skin: dry, no rashes  Musculoskeletal: no joint effusions, normal range of motion  Psychiatric: appropriate affect, normal speech  Neurologic: extraocular muscles intact, clear speech, moving all extremities with intact sensorium         Labs on Admission:  Basic Metabolic Panel: Recent Labs  Lab 10/11/23 0935  NA 141  K 4.8  CL 109  CO2 20*  GLUCOSE 103*  BUN 24*  CREATININE 1.01*  CALCIUM  9.9   Liver Function Tests: Recent Labs  Lab 10/11/23 0935  AST 46*  ALT 29  ALKPHOS 189*  BILITOT 1.0  PROT 6.3*  ALBUMIN  3.8   No results for input(s): LIPASE, AMYLASE in the last 168 hours. No results for input(s): AMMONIA in the last 168 hours. CBC: Recent Labs  Lab 10/11/23 0935  WBC 5.9  NEUTROABS 4.3  HGB 11.1*  HCT 36.1  MCV 95.0  PLT 195   Cardiac Enzymes: No results for input(s): CKTOTAL, CKMB, CKMBINDEX, TROPONINI in the last 168 hours. BNP (last 3 results) Recent Labs    02/14/23 1203 05/10/23 1645 05/11/23 0306  BNP 533.1* 499.6* 551.0*    ProBNP  (last 3 results) Recent Labs    10/11/23 0935  PROBNP 4,647.0*    CBG: No results for input(s): GLUCAP in the last 168 hours.  Radiological Exams on Admission: DG Chest Portable 1 View Result Date: 10/11/2023 CLINICAL DATA:  Shortness of breath. Bilateral lower extremity edema. Some intermittent chest pressure. EXAM: PORTABLE CHEST 1 VIEW COMPARISON:  05/10/2023 FINDINGS: Lungs are adequately inflated without lobar consolidation or effusion. There is subtle hazy prominence of the central vasculature suggesting mild vascular congestion. Cardiomediastinal silhouette and remainder of the exam is unchanged. IMPRESSION: Suggestion of mild vascular congestion. Electronically Signed   By: Toribio Agreste M.D.   On: 10/11/2023 10:18   Assessment/Plan Tanna Loeffler is a 68 y.o. female with medical history significant for hypertension, hyperlipidemia, obesity, heart failure with preserved EF being admitted to the hospital with 2 days of shortness of breath, lower extremity edema and concern for acute on chronic heart failure with preserved EF.  Acute on chronic congestive heart failure with preserved EF-given elevated BNP, lower extremity edema, dyspnea with exertion.  Today she weighs 86 kg, at the time of her last hospital discharge April 2025, she weighed 79 kg.  Patient denies dietary indiscretion, states she has been compliant with her diuretic. -Inpatient admission -Telemetry monitoring -Cardiac diet with fluid restriction -Will diurese aggressively with IV Lasix  40 mg twice daily -Will increase potassium supplementation to 40 mill equivalents daily -Strict ins and outs, daily weights and consult heart failure navigator -Given recurrent heart failure admissions, inpatient cardiology consultation requested -Continue Toprol -XL  Paroxysmal atrial fibrillation-continue Toprol -XL, Eliquis   Hyperlipidemia-Lipitor  GERD-p.o. Protonix   Chronic normocytic anemia-unknown cause, appears to be  stable    Code Status: Full Code  Consults called: Cardiology  Admission status: The appropriate patient status for this patient is INPATIENT. Inpatient status is judged to be reasonable and necessary in order to provide the required intensity of service to ensure the patient's safety. The patient's presenting symptoms, physical exam findings, and initial radiographic and laboratory data in the context of  their chronic comorbidities is felt to place them at high risk for further clinical deterioration. Furthermore, it is not anticipated that the patient will be medically stable for discharge from the hospital within 2 midnights of admission.    I certify that at the point of admission it is my clinical judgment that the patient will require inpatient hospital care spanning beyond 2 midnights from the point of admission due to high intensity of service, high risk for further deterioration and high frequency of surveillance required  Time spent: 56 minutes  Rhylin Venters CHRISTELLA Gail MD [facility] Pager [phone]  If 7PM-7AM, please contact night-coverage [email or web address] Password [email or web address]  10/11/2023, 2:14 PM

## 2023-10-11 NOTE — ED Notes (Signed)
 Pt took a few steps to the scale and weighed in at 190.9. Pickering made aware. Pt became slightly ShoB on mild exertion but refused supplemental 02. Slight drop in sats to 93%.

## 2023-10-11 NOTE — ED Triage Notes (Signed)
 Pt BIB EMS from home for SOB upon waking. Unsure if she took her lasix  yesterday. Bilateral lower extremity pitting edema. SOB with exertion, some intermittent chest pressure. Hx of afib on eliquis .

## 2023-10-11 NOTE — ED Provider Notes (Signed)
 [facility] Provider Note   CSN: [id number] Arrival date & time: 10/11/23  0901     Patient presents with: Shortness of Breath   Christine [facility]son is a 68 y.o. female.    Shortness of Breath Patient shortness of breath.  Has been worse yesterday but worse today.  Difficulty laying flat states that is somewhat chronic for her.  History of CHF.  Feels that she is getting somewhat more fluid.  Is on Lasix  but unsure if she took it yesterday.  No fevers.  No coughing.  No chest pain.  Does not weigh herself at home.     Prior to Admission medications   Medication Sig Start Date End Date Taking? Authorizing Provider  apixaban  (ELIQUIS ) 5 MG TABS tablet Take 1 tablet (5 mg total) by mouth 2 (two) times daily. 06/16/23  Yes Tobb, Kardie, DO  allopurinol  (ZYLOPRIM ) 100 MG tablet Take 0.5 tablets (50 mg total) by mouth daily for gout Patient not taking: Reported on 10/11/2023 03/16/23     allopurinol  (ZYLOPRIM ) 100 MG tablet Take 1 tablet (100 mg total) by mouth daily. Patient not taking: Reported on 10/11/2023 07/14/23     ascorbic acid  (VITAMIN C ) 500 MG tablet Take 1 tablet (500 mg total) by mouth daily for 30 days 06/10/22   [name], MD  atorvastatin  (LIPITOR) 80 MG tablet Take 1 tablet (80 mg total) by mouth daily. 03/21/23     escitalopram  (LEXAPRO ) 5 MG tablet Take 1 tablet (5 mg total) by mouth daily. Patient taking differently: Take 5 mg by mouth at bedtime. 09/22/22   Bryan Bianchi, MD  furosemide  (LASIX ) 40 MG tablet Take 1 tablet (40 mg total) by mouth daily. For weight gains more than 3 pounds in 1 day or 5 pounds in 1 week. Stop torsemide  06/16/23   Tobb, Kardie, DO  metoprolol  succinate (TOPROL -XL) 25 MG 24 hr tablet Take 1 tablet (25 mg total) by mouth daily. 06/16/23   Tobb, Kardie, DO  pantoprazole  (PROTONIX ) 40 MG tablet Take 1 tablet (40 mg total) by mouth daily. 08/19/22   Cindy Garnette POUR, MD  pantoprazole  (PROTONIX ) 40 MG  tablet Take 1 tablet (40 mg total) by mouth every morning. 07/13/23     Potassium Chloride  ER 20 MEQ TBCR Take 1 tablet (20 mEq total) by mouth every other day. 05/31/23   Tobb, Kardie, DO  predniSONE  (DELTASONE ) 10 MG tablet Day 1: Take 2 tablets by mouth at breakfast, 1 at lunch and 1 at dinner, and 2 at bedtime.  Day 2: Take 1 tablet by mouth at breakfast, 1 at lunch and 1 at dinner, and 2 tablets at bedtime.  Day 3: Take 1 tablet at breakfast, 1 at lunch, and 1 at dinner and 1 at bedtime.  Day 4: Take 1 tab by mouth at breakfast, 1 at lunch and 1 at bedtime.  Day 5: Take 1 tab at breakfast and 1 at bedtime.  Day 6: Take 1 tab at breakfast. 03/16/23     Zinc  Sulfate 220 (50 Zn) MG TABS Take 1 tablet (220 mg total) by mouth daily for 30 days 06/10/22   [name], MD  QUEtiapine  (SEROQUEL ) 25 MG tablet Take 0.5 tablets (12.5 mg total) by mouth at bedtime for 7 days, THEN 1 tablet (25 mg total) at bedtime for mood and insomnia. Patient not taking: Reported on 05/11/2023 04/20/23 07/13/23      Allergies: Pork-derived products    Review  of Systems  Respiratory:  Positive for shortness of breath.     Updated Vital Signs BP (!) 157/93   Pulse 87   Temp 97.7 F (36.5 C) (Oral)   Resp 19   SpO2 97%   Physical Exam Vitals and nursing note reviewed.  Cardiovascular:     Rate and Rhythm: Normal rate.  Pulmonary:     Breath sounds: No wheezing or rhonchi.  Chest:     Chest wall: No tenderness.  Abdominal:     Tenderness: There is no abdominal tenderness.  Musculoskeletal:     Right lower leg: Edema present.     Left lower leg: Edema present.  Skin:    Capillary Refill: Capillary refill takes less than 2 seconds.  Neurological:     Mental Status: She is alert.     (all labs ordered are listed, but only abnormal results are displayed) Labs Reviewed  PRO BRAIN NATRIURETIC PEPTIDE - Abnormal; Notable for the following components:      Result Value   Pro Brain Natriuretic Peptide  4,647.0 (*)    All other components within normal limits  COMPREHENSIVE METABOLIC PANEL WITH GFR - Abnormal; Notable for the following components:   CO2 20 (*)    Glucose, Bld 103 (*)    BUN 24 (*)    Creatinine, Ser 1.01 (*)    Total Protein 6.3 (*)    AST 46 (*)    Alkaline Phosphatase 189 (*)    GFR, Estimated 60 (*)    All other components within normal limits  CBC WITH DIFFERENTIAL/PLATELET - Abnormal; Notable for the following components:   RBC 3.80 (*)    Hemoglobin 11.1 (*)    RDW 16.5 (*)    All other components within normal limits  TROPONIN T, HIGH SENSITIVITY - Abnormal; Notable for the following components:   Troponin T High Sensitivity 35 (*)    All other components within normal limits  TROPONIN T, HIGH SENSITIVITY - Abnormal; Notable for the following components:   Troponin T High Sensitivity 34 (*)    All other components within normal limits    EKG: EKG Interpretation Date/Time:  Wednesday October 11 2023 09:24:35 EDT Ventricular Rate:  80 PR Interval:    QRS Duration:  109 QT Interval:  411 QTC Calculation: 475 R Axis:   15  Text Interpretation: Atrial fibrillation RSR' in V1 or V2, probably normal variant Confirmed by Patsey Lot [phone]) on 10/11/2023 9:56:20 AM  Radiology: [facility] Chest Portable 1 View Result Date: 10/11/2023 CLINICAL DATA:  Shortness of breath. Bilateral lower extremity edema. Some intermittent chest pressure. EXAM: PORTABLE CHEST 1 VIEW COMPARISON:  05/10/2023 FINDINGS: Lungs are adequately inflated without lobar consolidation or effusion. There is subtle hazy prominence of the central vasculature suggesting mild vascular congestion. Cardiomediastinal silhouette and remainder of the exam is unchanged. IMPRESSION: Suggestion of mild vascular congestion. Electronically Signed   By: Toribio Agreste M.D.   On: 10/11/2023 10:18     Procedures   Medications Ordered in the ED  atorvastatin  (LIPITOR) tablet 80 mg (has no administration in  time range)  metoprolol  succinate (TOPROL -XL) 24 hr tablet 25 mg (has no administration in time range)  escitalopram  (LEXAPRO ) tablet 5 mg (has no administration in time range)  pantoprazole  (PROTONIX ) EC tablet 40 mg (has no administration in time range)  apixaban  (ELIQUIS ) tablet 5 mg (has no administration in time range)  acetaminophen  (TYLENOL ) tablet 650 mg (has no administration in time range)    Or  acetaminophen  (TYLENOL ) suppository 650 mg (has no administration in time range)  traZODone  (DESYREL ) tablet 25 mg (has no administration in time range)  ondansetron  (ZOFRAN ) tablet 4 mg (has no administration in time range)    Or  ondansetron  (ZOFRAN ) injection 4 mg (has no administration in time range)  albuterol  (PROVENTIL ) (2.5 MG/3ML) 0.083% nebulizer solution 2.5 mg (has no administration in time range)  furosemide  (LASIX ) injection 40 mg (has no administration in time range)  potassium chloride  SA (KLOR-CON  M) CR tablet 40 mEq (40 mEq Oral Given 10/11/23 1545)  furosemide  (LASIX ) injection 40 mg (40 mg Intravenous Given 10/11/23 1257)                                    Medical Decision Making Amount and/or Complexity of Data Reviewed Labs: ordered. Radiology: ordered.  Risk Prescription drug management. Decision regarding hospitalization.   Patient shortness of breath.  History of CHF.  I think this is the most likely diagnosis although other causes such as pneumonia considered.  No cough.  No fevers.  Will get blood work and x-ray.  Will weigh patient.  Reviewed previous discharge note.  Weight outpatient today is 86.8 kg.  Last weight although months ago was 79.2 kg.  BNP is elevated. creatinine is reassuring.  However with the weight being up about 20 pounds I think she would benefit from admission to the [facility].  Will discuss with hospitalist.           Final diagnoses:  Congestive heart failure, unspecified HF chronicity, unspecified heart failure type  [facility])    ED Discharge Orders     None          Patsey Lot, MD 10/11/23 1550

## 2023-10-12 ENCOUNTER — Inpatient Hospital Stay (HOSPITAL_COMMUNITY)

## 2023-10-12 DIAGNOSIS — E785 Hyperlipidemia, unspecified: Secondary | ICD-10-CM

## 2023-10-12 DIAGNOSIS — I272 Pulmonary hypertension, unspecified: Secondary | ICD-10-CM | POA: Diagnosis not present

## 2023-10-12 DIAGNOSIS — I5033 Acute on chronic diastolic (congestive) heart failure: Secondary | ICD-10-CM

## 2023-10-12 DIAGNOSIS — I10 Essential (primary) hypertension: Secondary | ICD-10-CM

## 2023-10-12 DIAGNOSIS — F329 Major depressive disorder, single episode, unspecified: Secondary | ICD-10-CM

## 2023-10-12 DIAGNOSIS — I4819 Other persistent atrial fibrillation: Secondary | ICD-10-CM

## 2023-10-12 DIAGNOSIS — R9439 Abnormal result of other cardiovascular function study: Secondary | ICD-10-CM | POA: Diagnosis present

## 2023-10-12 DIAGNOSIS — R7989 Other specified abnormal findings of blood chemistry: Secondary | ICD-10-CM | POA: Diagnosis present

## 2023-10-12 DIAGNOSIS — I421 Obstructive hypertrophic cardiomyopathy: Secondary | ICD-10-CM

## 2023-10-12 DIAGNOSIS — R079 Chest pain, unspecified: Secondary | ICD-10-CM | POA: Diagnosis not present

## 2023-10-12 DIAGNOSIS — I50813 Acute on chronic right heart failure: Secondary | ICD-10-CM

## 2023-10-12 DIAGNOSIS — I5031 Acute diastolic (congestive) heart failure: Secondary | ICD-10-CM | POA: Diagnosis not present

## 2023-10-12 LAB — BASIC METABOLIC PANEL WITH GFR
Anion gap: 15 (ref 5–15)
BUN: 27 mg/dL — ABNORMAL HIGH (ref 8–23)
CO2: 20 mmol/L — ABNORMAL LOW (ref 22–32)
Calcium: 9.7 mg/dL (ref 8.9–10.3)
Chloride: 107 mmol/L (ref 98–111)
Creatinine, Ser: 1.21 mg/dL — ABNORMAL HIGH (ref 0.44–1.00)
GFR, Estimated: 48 mL/min — ABNORMAL LOW (ref >60)
Glucose, Bld: 100 mg/dL — ABNORMAL HIGH (ref 70–99)
Potassium: 4.4 mmol/L (ref 3.5–5.1)
Sodium: 141 mmol/L (ref 135–145)

## 2023-10-12 LAB — CBC
HCT: 34.3 % — ABNORMAL LOW (ref 36.0–46.0)
Hemoglobin: 10.8 g/dL — ABNORMAL LOW (ref 12.0–15.0)
MCH: 29.8 pg (ref 26.0–34.0)
MCHC: 31.5 g/dL (ref 30.0–36.0)
MCV: 94.5 fL (ref 80.0–100.0)
Platelets: 196 K/uL (ref 150–400)
RBC: 3.63 MIL/uL — ABNORMAL LOW (ref 3.87–5.11)
RDW: 16.5 % — ABNORMAL HIGH (ref 11.5–15.5)
WBC: 7.3 K/uL (ref 4.0–10.5)
nRBC: 0 % (ref 0.0–0.2)

## 2023-10-12 LAB — HEMOGLOBIN A1C
Hgb A1c MFr Bld: 4.2 % — ABNORMAL LOW (ref 4.8–5.6)
Mean Plasma Glucose: 73.84 mg/dL

## 2023-10-12 LAB — PRO BRAIN NATRIURETIC PEPTIDE: Pro Brain Natriuretic Peptide: 4823 pg/mL — ABNORMAL HIGH (ref <300.0)

## 2023-10-12 MED ORDER — METOPROLOL SUCCINATE ER 50 MG PO TB24
25.0000 mg | ORAL_TABLET | Freq: Every day | ORAL | Status: DC
  Administered 2023-10-14 – 2023-10-15 (×2): 25 mg via ORAL
  Filled 2023-10-12 (×2): qty 1

## 2023-10-12 MED ORDER — EZETIMIBE 10 MG PO TABS
10.0000 mg | ORAL_TABLET | Freq: Every day | ORAL | Status: DC
  Administered 2023-10-12 – 2023-10-15 (×3): 10 mg via ORAL
  Filled 2023-10-12 (×3): qty 1

## 2023-10-12 MED ORDER — SODIUM CHLORIDE 0.9 % IV SOLN: INTRAVENOUS | Status: DC

## 2023-10-12 MED ORDER — HEPARIN (PORCINE) 25000 UT/250ML-% IV SOLN
1150.0000 [IU]/h | INTRAVENOUS | Status: DC
  Administered 2023-10-12: 1000 [IU]/h via INTRAVENOUS
  Filled 2023-10-12: qty 250

## 2023-10-12 MED ORDER — ASPIRIN 81 MG PO CHEW
81.0000 mg | CHEWABLE_TABLET | ORAL | Status: AC
  Administered 2023-10-13: 81 mg via ORAL
  Filled 2023-10-12: qty 1

## 2023-10-12 NOTE — TOC Initial Note (Signed)
 Transition of Care [facility]) - Initial/Assessment Note    Patient Details  Name: Christine Jacobson MRN: [id number] Date of Birth: [birth year]  [facility] CM/SW Contact:    Bascom Service, RN Phone Number: 10/12/2023, 9:01 AM  Clinical Narrative: Patient's d/c plan home. Await PT eval & recc. HF screen-not appropriate.Haw own transport home.                 Expected Discharge Plan: Home w Home Health Services Barriers to Discharge: Continued Medical Work up   Patient Goals and CMS Choice Patient states their goals for this hospitalization and ongoing recovery are:: Home CMS Medicare.gov Compare Post Acute Care list provided to:: Patient Choice offered to / list presented to : Patient [place] ownership interest in [facility].provided to:: Patient    Expected Discharge Plan and Services   Discharge Planning Services: CM Consult Post Acute Care Choice: Home Health Living arrangements for the past 2 months: Single Family Home                                      Prior Living Arrangements/Services Living arrangements for the past 2 months: Single Family Home Lives with:: Self                   Activities of Daily Living   ADL Screening (condition at time of admission) Independently performs ADLs?: Yes (appropriate for developmental age) Is the patient deaf or have difficulty hearing?: No Does the patient have difficulty seeing, even when wearing glasses/contacts?: No Does the patient have difficulty concentrating, remembering, or making decisions?: No  Permission Sought/Granted                  Emotional Assessment              Admission diagnosis:  Acute diastolic (congestive) heart failure (HCC) [I50.31] Congestive heart failure, unspecified HF chronicity, unspecified heart failure type (HCC) [I50.9] Patient Active Problem List   Diagnosis Date Noted   Acute diastolic (congestive) heart failure (HCC) 10/11/2023    Persistent atrial fibrillation (HCC) 12/28/2022   Acute on chronic diastolic CHF (congestive heart failure) (HCC) 12/27/2022   Major depressive disorder, 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   MDD (major depressive disorder) 09/29/2022   Anxiety 09/16/2022   Acute exacerbation of CHF (congestive heart failure) (HCC) 09/15/2022   Atrial fibrillation by electrocardiogram (HCC) 09/15/2022   Leg swelling 09/15/2022   GIB (gastrointestinal bleeding) 08/05/2022   Acquired thrombophilia 08/05/2022   Chronic anticoagulation 08/05/2022   Acute blood loss anemia 08/04/2022   Cellulitis of both lower extremities 07/13/2022   Hypocarbia 07/13/2022   Normocytic anemia 07/13/2022   Cellulitis 06/02/2022   Sepsis due to cellulitis (HCC) 06/02/2022   AKI (acute kidney injury) 06/02/2022   Hyponatremia 06/02/2022   Dehydration 06/02/2022   Metabolic acidosis 06/02/2022   Pulmonary hypertension, unspecified (HCC)    Cellulitis and abscess of left lower extremity 10/13/2021   PAF (paroxysmal atrial fibrillation) (HCC) 07/28/2021   Obesity (BMI 30-39.9)    Liver function test abnormality    Primary hypertension    Chronic diastolic heart failure (HCC) 12/15/2016   HOCM (hypertrophic obstructive cardiomyopathy) (HCC) 01/22/2016   Lower extremity edema 09/17/2014   History of syncope 12/05/2013   Syncope 12/05/2013   Pneumonia 11/23/2012   Shortness of breath 07/11/2011   Hypertension 11/23/2010   Chest pain 10/18/2010  Murmur 10/18/2010   Palpitations 09/23/2010   Mild hyperlipidemia    Elevated BP    Hyperprolactinemia (HCC)    Pyoderma gangrenosa    PCP:  [name], NP Pharmacy:   [facility] [address] [place] [address] Phone: [phone] Fax: [phone]     Social Drivers of Health (SDOH) Social History: SDOH Screenings   Food Insecurity: No Food Insecurity (10/11/2023)  Housing: Low Risk   (10/11/2023)  Transportation Needs: No Transportation Needs (10/11/2023)  Utilities: Not At Risk (10/11/2023)  Depression (PHQ2-9): Low Risk  (10/26/2021)  Financial Resource Strain: Low Risk  (12/03/2021)  Social Connections: Unknown (10/11/2023)  Tobacco Use: Medium Risk (10/11/2023)   SDOH Interventions:     Readmission Risk Interventions    05/12/2023    4:15 PM 12/28/2022   11:28 AM 08/07/2022   11:10 AM  Readmission Risk Prevention Plan  Transportation Screening Complete Complete Complete  PCP or Specialist Appt within 5-7 Days   Complete  PCP or Specialist Appt within 3-5 Days  Complete   Home Care Screening   Complete  Medication Review (RN CM)   Complete  HRI or Home Care Consult  Complete   Social Work Consult for Recovery Care Planning/Counseling  Complete   Palliative Care Screening  Not Applicable   Medication Review Oceanographer) Complete Complete   HRI or Home Care Consult Complete    Palliative Care Screening Not Applicable    Skilled Nursing Facility Not Applicable

## 2023-10-12 NOTE — Consult Note (Signed)
 Cardiology Consultation   Patient ID: Christine Jacobson MRN: [id number]; DOB: [birth year]  Admit date: 10/11/2023 Date of Consult: 10/12/2023  PCP:  [name], NP   [facility] Providers Cardiologist:  Dub Huntsman, DO        Patient Profile: Christine Jacobson is a 68 y.o. 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  History of Present Illness: [name] follows with [facility]. She has a history of HOCM with intracavity pressure gradient of 64 mmHg with Valsalva. In 2023 she underwent a RHC which showed moderate pulmonary hypertension [52/23 mmHg] suspected to be due to [facility] Group 2. In that same year, she underwent a Lexiscan  which showed normal myocardial perfusion though stress ECG was positive for ischemic changes in the inferior leads. Decision was made at the follow-up appointment to pursue medical therapy and forgo cardiac catheterization.  Cardizem  was discontinued in 2024 after an admission for sepsis.  On chart review appears SDOH and medication non-compliance has been an issue with management  Patient has not been seen by cardiology since  her last admission 04/2023 for a heart failure exacerbation. At that time she had received IV diuresis and antibiotics for concurrent cellulitis. Appears her statin was also discontinued due to elevated LFTS and was to be restarted outpatient. She was lost to follow-up.  At that time an echo was obtained which showed hypertrophic cardiomyopathy with severe concentric LVH and prominent basilar septum [2.3cm]. LVOT gradient 36 mmHg. LVEF 60-65%, no RWMA. Mildly reduced RV function. Severely  dilated RA and LA. Mild MR with severe MAC.   Presented to the ED 9/24 for dyspnea on exertion, intermittent chest pressure, and BLE edema. Per triage notes patient was unsure if she had taken the previously day's dose of lasix .  In the ED: BP: 160/95   HR 72   SpO2 99% on RA ECG: AF VR 80 CXR shows evidence of mild vascular congestion  Lab work: Cr 1.01 -> 1.21  AST 46  Alk phos 189  hgb ~11 Pro BNP 4647 -> 4823 Troponin 35 -> 34  She received IV lasix  40 mg x 2. Net IO Since Admission: -2,560 mL [10/12/23 1116] She has had a 4lb weight reduction with IV diuresis. Last known dry weight 174lbs,   On interview, shared two weeks ago she had a cold, is better now from that stand point. Yesterday started noting worsening DOE, dizziness with standing, and random intermittent chest pain. No SOB at rest. 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 She did share she has been having some issues with anxiety lately.  Denied abdominal distention and change in appetite.  She has been walking around without difficulty. Reports some improvement in her breathing.  As for her peripheral edema, it is worse-> she notes overlying skin changes are chronic and do not bother her.    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   2D ECHO - EF >  55%, normal   HTN (hypertension)    Hyperprolactinemia    dx in her 18, took meds, self d/c a while back   Liver function test abnormality    normal when repeated   Mild hyperlipidemia    Obesity    Palpitations    negative stress echo in November 2012 with normal LV function; mild LVH, proximal septal thickening with narrow LVOT and mild gradient; mild MR and TR; Cardionet showed PACs in November 2012    Pyoderma gangrenosa    Shortness of breath 07/11/2011   MET TEST    Past Surgical History:  Procedure Laterality Date   BIOPSY  08/10/2022   Procedure: BIOPSY;  Surgeon: Burnette Fallow, MD;  Location: [facility];  Service: Gastroenterology;;   CARDIOVERSION N/A 02/01/2022   Procedure: CARDIOVERSION;  Surgeon: Sheena Pugh, DO;  Location: [facility];  Service: Cardiovascular;  Laterality: N/A;   CARDIOVERSION N/A 09/20/2022   Procedure: CARDIOVERSION;  Surgeon: Cherrie Toribio SAUNDERS, MD;  Location: [facility];  Service: Cardiovascular;  Laterality: N/A;   COLONOSCOPY WITH PROPOFOL  Left 08/10/2022   Procedure: COLONOSCOPY WITH PROPOFOL ;  Surgeon: Burnette Fallow, MD;  Location: [facility];  Service: Gastroenterology;  Laterality: Left;   ESOPHAGOGASTRODUODENOSCOPY (EGD) WITH PROPOFOL  Left 08/10/2022   Procedure: ESOPHAGOGASTRODUODENOSCOPY (EGD) WITH PROPOFOL ;  Surgeon: Burnette Fallow, MD;  Location: [facility];  Service: Gastroenterology;  Laterality: Left;   POLYPECTOMY  08/10/2022   Procedure: POLYPECTOMY;  Surgeon: Burnette Fallow, MD;  Location: [facility];  Service: Gastroenterology;;   RIGHT HEART CATH N/A 10/15/2021   Procedure: RIGHT HEART CATH;  Surgeon: Elmira Newman PARAS, MD;  Location: [facility];  Service: Cardiovascular;  Laterality: N/A;   SKIN GRAFT  2006   porcine, R leg   TEE WITHOUT CARDIOVERSION N/A 09/20/2022   Procedure: TRANSESOPHAGEAL ECHOCARDIOGRAM;  Surgeon: Cherrie Toribio SAUNDERS, MD;  Location: [facility];  Service: Cardiovascular;  Laterality: N/A;       Scheduled Meds:  atorvastatin   80 mg Oral Daily   escitalopram   5 mg Oral QHS   furosemide   40 mg Intravenous BID   metoprolol  succinate  25 mg Oral Daily   pantoprazole   40 mg Oral q morning   potassium chloride   40 mEq Oral Daily   Continuous Infusions:  PRN Meds: acetaminophen  **OR** acetaminophen , albuterol , ondansetron  **OR** ondansetron  (ZOFRAN ) IV,  traZODone   Allergies:    Allergies  Allergen Reactions   Pork-Derived Products Other (See Comments)    Religious preference    Social History:   Social History   Socioeconomic History   Marital status: Single    Spouse name: Not on file   Number of children: 0   Years of education: Not on file   Highest education level: Not on file  Occupational History   Occupation: para Psychologist, forensic, part time cook  Tobacco Use   Smoking status: Former    Current packs/day: 1.00    Average packs/day: 1 pack/day for 4.0 years (4.0 ttl pk-yrs)    Types: Cigarettes   Smokeless tobacco: Never  Vaping Use   Vaping status: Never Used  Substance and Sexual Activity   Alcohol use: Not Currently    Alcohol/week: 1.0 standard drink of alcohol    Types: 1 Glasses of wine per week    Comment: I do have my wine every day, has cut down from 2 bottles at a time, currently 2-3 glasses   Drug use: No   Sexual activity: Not Currently  Other  Topics Concern   Not on file  Social History Narrative   Lives by self   Social Drivers of Health   Financial Resource Strain: Low Risk  (12/03/2021)   Overall Financial Resource Strain (CARDIA)    Difficulty of Paying Living Expenses: Not very hard  Food Insecurity: No Food Insecurity (10/11/2023)   Hunger Vital Sign    Worried About Running Out of Food in the Last Year: Never true    Ran Out of Food in the Last Year: Never true  Transportation Needs: No Transportation Needs (10/11/2023)   PRAPARE - Administrator, Civil Service (Medical): No    Lack of Transportation (Non-Medical): No  Physical Activity: Not on file  Stress: Not on file  Social Connections: Unknown (10/11/2023)   Social Connection and Isolation Panel    Frequency of Communication with Friends and Family: Once a week    Frequency of Social Gatherings with Friends and Family: Once a week    Attends Religious Services: 1 to 4 times per year    Active Member of [employer] or  Organizations: No    Attends Banker Meetings: 1 to 4 times per year    Marital Status: Patient declined  Intimate Partner Violence: Not At Risk (10/11/2023)   Humiliation, Afraid, Rape, and Kick questionnaire    Fear of Current or Ex-Partner: No    Emotionally Abused: No    Physically Abused: No    Sexually Abused: No    Family History:   Family History  Problem Relation Age of Onset   Heart disease Mother        endocarditis   Ovarian cancer Mother    Emphysema Father    Coronary artery disease Other        F in his 72   Diabetes Other        GP   Cancer Neg Hx      ROS:  Please see the history of present illness.  All other ROS reviewed and negative.     Physical Exam/Data: Vitals:   10/11/23 2109 10/12/23 0500 10/12/23 0554 10/12/23 0851  BP: (!) 133/91  127/88 127/88  Pulse: 91  89 89  Resp: 20  16   Temp: (!) 97.5 F (36.4 C)  98.7 F (37.1 C)   TempSrc: Oral  Oral   SpO2: 96%  96%   Weight:  82.7 kg    Height:        Intake/Output Summary (Last 24 hours) at 10/12/2023 1116 Last data filed at 10/12/2023 1033 Gross per 24 hour  Intake 240 ml  Output 2800 ml  Net -2560 ml      10/12/2023    5:00 AM 10/11/2023    4:40 PM 05/13/2023    5:09 AM  Last 3 Weights  Weight (lbs) 182 lb 5.1 oz 186 lb 6.4 oz 174 lb 9.6 oz  Weight (kg) 82.7 kg 84.55 kg 79.198 kg     Body mass index is 33.35 kg/m.  General:  Pleasant older women in no acute distress HEENT: normal Neck: no JVD Vascular: Radial pulses 2+ bilaterally Cardiac:  irregularly irregular, systolic murmur at sternal border and apex Lungs:  clear to auscultation bilaterally, no wheezing, rhonchi or rales  Abd: soft, nontender, no hepatomegaly  Ext: BLE edema with overlying erythema and dry scaling skin. Tender to palpation, hard to assess degree of pitting Musculoskeletal:  No deformities, BUE and BLE strength normal and equal Skin: warm and dry  Neuro:  CNs 2-12 intact, no focal  abnormalities noted Psych:  Normal affect   EKG:  The EKG was personally reviewed and demonstrates:  see hpi Telemetry:  Telemetry was personally reviewed and demonstrates: rate-controlled coarse AF HR 80-90  Relevant CV Studies: Echocardiogram 04/2023 IMPRESSIONS     1. Hypertrophic cardiomyopathy with severe consentric hypertrophy and  prominence of basilar septum (2.3cm). LVOT gradeint measured at at  rest. . Left ventricular ejection fraction, by estimation, is 60 to 65%.  The left ventricle has normal  function. The left ventricle has no regional wall motion abnormalities.  Left ventricular diastolic parameters are indeterminate.   2. Right ventricular systolic function is mildly reduced. The right  ventricular size is normal.   3. Left atrial size was severely dilated.   4. Right atrial size was severely dilated.   5. The mitral valve is degenerative. Mild mitral valve regurgitation. No  evidence of mitral stenosis. Severe mitral annular calcification.   6. The aortic valve is tricuspid. There is mild calcification of the  aortic valve. Aortic valve regurgitation is not visualized. No aortic  stenosis is present.   7. The inferior vena cava is dilated in size with <50% respiratory  variability, suggesting right atrial pressure of 15 mmHg.   Laboratory Data:  Chemistry Recent Labs  Lab 10/11/23 0935 10/12/23 0458  NA 141 141  K 4.8 4.4  CL 109 107  CO2 20* 20*  GLUCOSE 103* 100*  BUN 24* 27*  CREATININE 1.01* 1.21*  CALCIUM  9.9 9.7  GFRNONAA 60* 48*  ANIONGAP 12 15    Recent Labs  Lab 10/11/23 0935  PROT 6.3*  ALBUMIN  3.8  AST 46*  ALT 29  ALKPHOS 189*  BILITOT 1.0    Hematology Recent Labs  Lab 10/11/23 0935 10/12/23 0458  WBC 5.9 7.3  RBC 3.80* 3.63*  HGB 11.1* 10.8*  HCT 36.1 34.3*  MCV 95.0 94.5  MCH 29.2 29.8  MCHC 30.7 31.5  RDW 16.5* 16.5*  PLT 195 196   BNP Recent Labs  Lab 10/11/23 0935 10/12/23 0458  PROBNP 4,647.0*  4,823.0*     Radiology/Studies:  DG Chest Portable 1 View Result Date: 10/11/2023 CLINICAL DATA:  Shortness of breath. Bilateral lower extremity edema. Some intermittent chest pressure. EXAM: PORTABLE CHEST 1 VIEW COMPARISON:  05/10/2023 FINDINGS: Lungs are adequately inflated without lobar consolidation or effusion. There is subtle hazy prominence of the central vasculature suggesting mild vascular congestion. Cardiomediastinal silhouette and remainder of the exam is unchanged. IMPRESSION: Suggestion of mild vascular congestion. Electronically Signed   By: Toribio Agreste M.D.   On: 10/11/2023 10:18     Assessment and Plan: Hypertrophic Obstructive Cardiomyopathy Acute on chronic diastolic heart failure Pulmonary Hypertension Demand Ischemia [troponin 35 -> 34 ] Patient admitted with similar presentation 04/2023, seen by [facility].  Patient had missed lasix  dose prior to previous and current admission. Reported recent viral URI.   Pro-BNP 4647 -> 4823 CXR: mild vascular congestion On exam her lungs sound clear, she has evident peripheral edema with overlying skin changes. Cr 1.0 -> 1.21 Net IO Since Admission: -2,560 mL [10/12/23 1119] with a 4lb weight loss. Current weight 82.7 kg, dry weight 79 kg.  Would recommend against aggressive diuresis given her HOCM and on exam notable murmur. Patient has been experiencing dizziness with positional changes. Will obtain orthostatics to rule out. Will obtain RHC tomorrow to assess degree of volume overload and if change in her pulmonary hypertension.  Could benefit  from unna boots Okay to continue IV lasix  40 mg BID. Would watch blood pressure and patient's symptoms closely. Restart PTA metoprolol   Start SGLT2i after cath tomorrow.   Chest pain Since 2023 patient has been experiencing intermittent chest pain. She had an indeterminate NM stress test with plans to pursue medical management. As this has not offered relief will pursue cardiac  catheterization for further evaluation. RHC/LHC tomorrow. Patient will be NPO at midnight and will hold eliquis  -> IV heparin .   Persistent Atrial fibrillation  Patient has been in persistent AF, most likely permanent though not formally diagnosed.  [place] Vasc 4 Restart metoprolol  succinate 25 mg  Hold Eliquis  5 mg BID Start iv heparin   Hyperlipidemia  LDL 118   HDL 52 Continue lipitor 80 mg Start zetia  10mg   Per primary Anxiety GERD  Chronic anemia  Risk Assessment/Risk Scores:       [place]  Heart Association (NYHA) Functional Class NYHA Class II  CHA2DS2-VASc Score = 4   This indicates a 4.8% annual risk of stroke. The patient's score is based upon: CHF History: 1 HTN History: 1 Diabetes History: 0 Stroke History: 0 Vascular Disease History: 0 Age Score: 1 Gender Score: 1        For questions or updates, please contact [facility] Please consult [email or web address] for contact info under      Signed, Leontine LOISE Salen, PA-C  10/12/2023 11:16 AM

## 2023-10-12 NOTE — Progress Notes (Addendum)
 PHARMACY - ANTICOAGULATION CONSULT NOTE  Pharmacy Consult for heparin  Indication: atrial fibrillation  Allergies  Allergen Reactions   Pork-Derived Products Other (See Comments)    Religious preference    Patient Measurements: Height: 5' 2 (157.5 cm) Weight: 82.7 kg (182 lb 5.1 oz) IBW/kg (Calculated) : 50.1 HEPARIN  DW (KG): 69.2  Vital Signs: Temp: 98.7 F (37.1 C) (09/25 0554) Temp Source: Oral (09/25 0554) BP: 127/88 (09/25 0851) Pulse Rate: 89 (09/25 0851)  Labs: Recent Labs    10/11/23 0935 10/12/23 0458  HGB 11.1* 10.8*  HCT 36.1 34.3*  PLT 195 196  CREATININE 1.01* 1.21*    Estimated Creatinine Clearance: 43.1 mL/min (A) (by C-G formula based on SCr of 1.21 mg/dL (H)).   Medical History: 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   2D ECHO - EF >55%, normal   HTN (hypertension)    Hyperprolactinemia    dx in her 18, took meds, self d/c a while back   Liver function test abnormality    normal when repeated   Mild hyperlipidemia    Obesity    Palpitations    negative stress echo in November 2012 with normal LV function; mild LVH, proximal septal thickening with narrow LVOT and mild gradient; mild MR and TR; Cardionet showed PACs in November 2012   Pyoderma gangrenosa    Shortness of breath 07/11/2011   MET TEST    Medications:  Medications Prior to Admission  Medication Sig Dispense Refill Last Dose/Taking   apixaban  (ELIQUIS ) 5 MG TABS tablet Take 1 tablet (5 mg total) by mouth 2 (two) times daily. 60 tablet 5 10/10/2023 at  9:00 PM   ascorbic acid  (VITAMIN C ) 500 MG tablet Take 1 tablet (500 mg total) by mouth daily for 30 days 100 tablet 0 10/10/2023 Morning   aspirin  EC 81 MG tablet Take 81 mg by mouth daily as needed for mild pain  (pain score 1-3) (or headaches). Swallow whole.   Unknown   furosemide  (LASIX ) 40 MG tablet Take 1 tablet (40 mg total) by mouth daily. For weight gains more than 3 pounds in 1 day or 5 pounds in 1 week. Stop torsemide  (Patient taking differently: Take 40 mg by mouth in the morning.) 90 tablet 1 10/10/2023 Morning   metoprolol  succinate (TOPROL -XL) 25 MG 24 hr tablet Take 1 tablet (25 mg total) by mouth daily. 90 tablet 1 10/10/2023 at  9:00 AM   Potassium Chloride  ER 20 MEQ TBCR Take 1 tablet (20 mEq total) by mouth every other day. (Patient taking differently: Take 20 mEq by mouth in the morning.) 45 tablet 2 10/10/2023 Morning   Zinc  Sulfate 220 (50 Zn) MG TABS Take 1 tablet (220 mg total) by mouth daily for 30 days 100 tablet 0 10/10/2023 Morning   allopurinol  (ZYLOPRIM ) 100 MG tablet Take 0.5 tablets (50 mg total) by mouth daily for gout (Patient not taking: Reported on 10/11/2023) 45 tablet 3 Not Taking   allopurinol  (ZYLOPRIM ) 100 MG tablet Take 1 tablet (100 mg total) by mouth daily. (Patient not taking: Reported on 10/11/2023) 90 tablet 3 Not Taking   atorvastatin  (LIPITOR) 80 MG tablet Take 1 tablet (80 mg total) by mouth daily. 90 tablet 3 Unknown   escitalopram  (LEXAPRO ) 5 MG tablet Take 1 tablet (5 mg total)  by mouth daily. (Patient not taking: Reported on 10/11/2023) 30 tablet 0 Not Taking   pantoprazole  (PROTONIX ) 40 MG tablet Take 1 tablet (40 mg total) by mouth daily. (Patient not taking: Reported on 10/11/2023) 90 tablet 3 Not Taking   pantoprazole  (PROTONIX ) 40 MG tablet Take 1 tablet (40 mg total) by mouth every morning. (Patient not taking: Reported on 10/11/2023) 90 tablet 3 Not Taking   predniSONE  (DELTASONE ) 10 MG tablet Day 1: Take 2 tablets by mouth at breakfast, 1 at lunch and 1 at dinner, and 2 at bedtime.  Day 2: Take 1 tablet by mouth at breakfast, 1 at lunch and 1 at dinner, and 2 tablets at bedtime.  Day 3: Take 1 tablet at breakfast, 1 at lunch, and 1 at dinner and 1 at bedtime.   Day 4: Take 1 tab by mouth at breakfast, 1 at lunch and 1 at bedtime.  Day 5: Take 1 tab at breakfast and 1 at bedtime.  Day 6: Take 1 tab at breakfast. (Patient not taking: Reported on 10/11/2023) 21 tablet 0 Not Taking   Scheduled:   atorvastatin   80 mg Oral Daily   escitalopram   5 mg Oral QHS   furosemide   40 mg Intravenous BID   metoprolol  succinate  25 mg Oral Daily   pantoprazole   40 mg Oral q morning   potassium chloride   40 mEq Oral Daily    Assessment: 68 YO female presenting with shortness of breath, lower extremity edema. Patient also complains of intermittent chest pain since 2023 without resolution with medical management. Patient has a history of atrial fibrillation on Eliquis  PTA (last dose given 9/25 @0900  while admitted). Cardiology following and planning [facility] tomorrow (9/26). Pharmacy has been consulted for heparin  management.  Noted in patient's chart that pork-derived products are contraindicated for religious purposes. I spoke with the patient regarding the route of administration, why heparin  is a good option for periop management of anticoagulation and she is agreeable to receive heparin  at this time.   Today, 10/12/23: Last dose of Eliquis  this morning at 0900--will not bolus heparin  given recent administration of Eliquis  Hgb 10.8, plts 196--stable Scr 1.21 (~BL), CrCl 48ml/min No s/sx of bleeding reported  Goal of Therapy:  Heparin  level 0.3-0.7 units/ml aPTT 66-102 seconds Monitor platelets by anticoagulation protocol: Yes   Plan:  Start heparin  infusion at 1000 units/hr tonight when next dose of Eliquis  is due (no bolus) Check heparin  level and aPTT 8hrs after heparin  infusion starts Will monitor with aPTT and heparin  levels for now given recent administration of Eliquis  and likely falsely high heparin  level as a result. May monitor with heparin  levels alone once correlating with aPTT. Daily heparin  level, aPTT, and CBC while on heparin  Monitor for s/sx of  bleeding  F/u plans for [facility] tomorrow and periop management of heparin    Lacinda Moats, PharmD Clinical Pharmacist  9/25/202511:16 AM

## 2023-10-12 NOTE — Progress Notes (Addendum)
 PROGRESS NOTE    Christine Jacobson  FMW:[id number] DOB: [birth year] DOA: 10/11/2023 PCP: [name], NP   Chief Complaint  Patient presents with   Shortness of Breath    Brief Narrative:  Patient 68 year old female history of hypertension, hyperlipidemia, obesity, HOCM, HFpEF admitted to the hospital with a 2-day history of shortness of breath, lower extremity edema, palpitations, concern for acute on chronic heart failure with preserved EF.  Patient also with some complaints of some intermittent chest pain.   Assessment & Plan:   Principal Problem:   Acute diastolic (congestive) heart failure (HCC) Active Problems:   Mild hyperlipidemia   Chest pain of uncertain etiology   Hypertension   HOCM (hypertrophic obstructive cardiomyopathy) (HCC)   Acute exacerbation of CHF (congestive heart failure) (HCC)   MDD (major depressive disorder)   Acute on chronic diastolic CHF (congestive heart failure) (HCC)   Elevated troponin   Abnormal stress test  #1 acute on chronic diastolic CHF/HOCM/pulmonary hypertension -Patient presenting with shortness of breath x 2 days, lower extremity edema, palpitations, some intermittent chest pain. -Patient still with shortness of breath. - BNP noted to be elevated on admission. - Chest x-ray concerning for vascular congestion. - 2D echo pending. - Patient currently on Lasix  40 mg IV every 12 hours with urine output of 1.9 L over the past 24 hours. - Patient seen in consultation by cardiology who noted a degree of noncompliance with medical therapy as well as noncompliance with multiple cardiac follow-ups. - Cardiology recommending ongoing off Lasix  40 mg IV every 12 hours, strict I's and O's, daily weights. - Patient noted to have been referred to Dr.Chandrasekhar for HOCM clinic but never followed up through and canceled appointments. - Cardiology recommending gentle diuresis to prevent an acute drop in preload resulting worsening of LVOT  gradient. - Continue Toprol -XL 25 mg daily. - Cards are recommending addition of Jardiance  10 mg daily after cardiac catheterization tomorrow.  2.  Chest pain/elevated troponin/history of abnormal stress Myoview -Patient noted to have told cardiology that she had been having some intermittent chest pain for some time. - Patient noted by cardiology to have a Myoview done in 2023 read as high risk, no ischemia, normal LV function - High-sensitivity troponin mildly elevated but flattened. - Per cardiology patient with some intermittent complaints of chest pain which could be related to HOCM or CHF, also concern for ischemia.  - Cardiology recommended right and left heart catheterization to define coronary anatomy and assess filling pressures which will be scheduled for tomorrow, 10/13/2023.  - Cardiology recommending  3.  Persistent A-fib -Patient with a history of A-fib and decision made for rate control given his severe left atrial enlargement. - Patient still in sinus rhythm. - Cha2ds2vasc SCORE 4 - Currently rate controlled. - Home regimen Toprol -XL 25 mg daily resumed per cardiology recommendations. - Patient on Eliquis  5 mg twice daily and Eliquis  being discontinued and patient being placed on IV heparin  per pharmacy protocol in anticipation of cardiac catheterization tomorrow, 10/13/2023.  4.  Hyperlipidemia -LDL of 118, HDL of 52. - Continue Lipitor 80 mg daily, Zetia  10 mg daily.  5.  GERD -PPI.  6.  Chronic normocytic anemia -Stable.    DVT prophylaxis: Eliquis >>> heparin  Code Status: Full Family Communication: Updated patient.  No family at bedside. Disposition: Likely home when clinically improved and cleared by cardiology.  Status is: Inpatient Remains inpatient appropriate because: Severity of illness.   Consultants:  Cardiology: Dr. Shlomo 10/12/2023  Procedures:  Chest x-ray 10/11/2023 2D echo pending  Antimicrobials:  Anti-infectives (From admission, onward)     None         Subjective: Patient laying in bed.  Patient with some complaints of some palpitations that she states lets her know her heart is still there.  Patient also noted some intermittent chest pain prior to admission.  Still with some shortness of breath.  Improved lower extremity edema.  But slowly improving.   Objective: Vitals:   10/12/23 0500 10/12/23 0554 10/12/23 0851 10/12/23 1332  BP:  127/88 127/88 124/84  Pulse:  89 89 76  Resp:  16  20  Temp:  98.7 F (37.1 C)  98 F (36.7 C)  TempSrc:  Oral  Oral  SpO2:  96%  97%  Weight: 82.7 kg     Height:        Intake/Output Summary (Last 24 hours) at 10/12/2023 1734 Last data filed at 10/12/2023 1300 Gross per 24 hour  Intake 480 ml  Output 4200 ml  Net -3720 ml   Filed Weights   10/11/23 1640 10/12/23 0500  Weight: 84.6 kg 82.7 kg    Examination:  General exam: Appears calm and comfortable  Respiratory system: Bibasilar crackles.  Some scattered crackles.  No wheezing.  Fair air movement.  Speaking in full sentences. Cardiovascular system: Irregularly irregular with 3/6 SEM.  No rubs or gallops.  Gastrointestinal system: Abdomen is nondistended, soft and nontender. No organomegaly or masses felt. Normal bowel sounds heard. Central nervous system: Alert and oriented. No focal neurological deficits. Extremities: Symmetric 5 x 5 power. Skin: No rashes, lesions or ulcers Psychiatry: Judgement and insight appear normal. Mood & affect appropriate.     Data Reviewed: I have personally reviewed following labs and imaging studies  CBC: Recent Labs  Lab 10/11/23 0935 10/12/23 0458  WBC 5.9 7.3  NEUTROABS 4.3  --   HGB 11.1* 10.8*  HCT 36.1 34.3*  MCV 95.0 94.5  PLT 195 196    Basic Metabolic Panel: Recent Labs  Lab 10/11/23 0935 10/12/23 0458  NA 141 141  K 4.8 4.4  CL 109 107  CO2 20* 20*  GLUCOSE 103* 100*  BUN 24* 27*  CREATININE 1.01* 1.21*  CALCIUM  9.9 9.7    GFR: Estimated  Creatinine Clearance: 43.1 mL/min (A) (by C-G formula based on SCr of 1.21 mg/dL (H)).  Liver Function Tests: Recent Labs  Lab 10/11/23 0935  AST 46*  ALT 29  ALKPHOS 189*  BILITOT 1.0  PROT 6.3*  ALBUMIN  3.8    CBG: No results for input(s): GLUCAP in the last 168 hours.   No results found for this or any previous visit (from the past 240 hours).       Radiology Studies: DG Chest Portable 1 View Result Date: 10/11/2023 CLINICAL DATA:  Shortness of breath. Bilateral lower extremity edema. Some intermittent chest pressure. EXAM: PORTABLE CHEST 1 VIEW COMPARISON:  05/10/2023 FINDINGS: Lungs are adequately inflated without lobar consolidation or effusion. There is subtle hazy prominence of the central vasculature suggesting mild vascular congestion. Cardiomediastinal silhouette and remainder of the exam is unchanged. IMPRESSION: Suggestion of mild vascular congestion. Electronically Signed   By: Toribio Agreste M.D.   On: 10/11/2023 10:18        Scheduled Meds:  [START ON 10/13/2023] aspirin   81 mg Oral Pre-Cath   atorvastatin   80 mg Oral Daily   escitalopram   5 mg Oral QHS   ezetimibe   10 mg Oral Daily  furosemide   40 mg Intravenous BID   metoprolol  succinate  25 mg Oral Daily   pantoprazole   40 mg Oral q morning   potassium chloride   40 mEq Oral Daily   Continuous Infusions:  [START ON 10/13/2023] sodium chloride      heparin        LOS: 1 day    Time spent: 45 minutes    Toribio Hummer, MD [facility]   To contact the attending provider between 7A-7P or the covering provider during after hours 7P-7A, please log into the web site [email or web address] and access using universal [place] password for that web site. If you do not have the password, please call the hospital operator.  10/12/2023, 5:34 PM

## 2023-10-12 NOTE — Progress Notes (Signed)
 Orthopedic Tech Progress Note Patient Details:  Christine Jacobson 20-Feb-1955 [id number]  Patient ID: Christine Jacobson, female   DOB: Mar 23, 1955, 68 y.o.   MRN: [id number]  Christine Jacobson 10/12/2023, 8:22 PM Per rn on early shift, pt declined unna boots.

## 2023-10-12 NOTE — Plan of Care (Signed)

## 2023-10-13 ENCOUNTER — Other Ambulatory Visit (HOSPITAL_COMMUNITY)

## 2023-10-13 ENCOUNTER — Inpatient Hospital Stay (HOSPITAL_COMMUNITY)
Admission: EM | Disposition: A | Discharge status: Home / Self Care | Payer: Self-pay | Source: Home / Self Care | Attending: Internal Medicine

## 2023-10-13 DIAGNOSIS — I5033 Acute on chronic diastolic (congestive) heart failure: Secondary | ICD-10-CM | POA: Diagnosis not present

## 2023-10-13 DIAGNOSIS — I2510 Atherosclerotic heart disease of native coronary artery without angina pectoris: Secondary | ICD-10-CM

## 2023-10-13 DIAGNOSIS — I272 Pulmonary hypertension, unspecified: Secondary | ICD-10-CM | POA: Diagnosis not present

## 2023-10-13 DIAGNOSIS — I421 Obstructive hypertrophic cardiomyopathy: Secondary | ICD-10-CM | POA: Diagnosis not present

## 2023-10-13 DIAGNOSIS — I5031 Acute diastolic (congestive) heart failure: Secondary | ICD-10-CM | POA: Diagnosis not present

## 2023-10-13 DIAGNOSIS — I10 Essential (primary) hypertension: Secondary | ICD-10-CM | POA: Diagnosis not present

## 2023-10-13 DIAGNOSIS — I4819 Other persistent atrial fibrillation: Secondary | ICD-10-CM | POA: Diagnosis not present

## 2023-10-13 HISTORY — PX: RIGHT/LEFT HEART CATH AND CORONARY ANGIOGRAPHY: CATH118266

## 2023-10-13 LAB — POCT I-STAT EG7
Acid-Base Excess: 2 mmol/L (ref 0.0–2.0)
Acid-Base Excess: 2 mmol/L (ref 0.0–2.0)
Bicarbonate: 27.1 mmol/L (ref 20.0–28.0)
Bicarbonate: 27.3 mmol/L (ref 20.0–28.0)
Calcium, Ion: 1.29 mmol/L (ref 1.15–1.40)
Calcium, Ion: 1.3 mmol/L (ref 1.15–1.40)
HCT: 35 % — ABNORMAL LOW (ref 36.0–46.0)
HCT: 36 % (ref 36.0–46.0)
Hemoglobin: 11.9 g/dL — ABNORMAL LOW (ref 12.0–15.0)
Hemoglobin: 12.2 g/dL (ref 12.0–15.0)
O2 Saturation: 62 %
O2 Saturation: 69 %
Potassium: 4 mmol/L (ref 3.5–5.1)
Potassium: 4 mmol/L (ref 3.5–5.1)
Sodium: 140 mmol/L (ref 135–145)
Sodium: 141 mmol/L (ref 135–145)
TCO2: 28 mmol/L (ref 22–32)
TCO2: 29 mmol/L (ref 22–32)
pCO2, Ven: 43.5 mmHg — ABNORMAL LOW (ref 44–60)
pCO2, Ven: 43.7 mmHg — ABNORMAL LOW (ref 44–60)
pH, Ven: 7.401 (ref 7.25–7.43)
pH, Ven: 7.406 (ref 7.25–7.43)
pO2, Ven: 33 mmHg (ref 32–45)
pO2, Ven: 36 mmHg (ref 32–45)

## 2023-10-13 LAB — BASIC METABOLIC PANEL WITH GFR
Anion gap: 13 (ref 5–15)
BUN: 33 mg/dL — ABNORMAL HIGH (ref 8–23)
CO2: 21 mmol/L — ABNORMAL LOW (ref 22–32)
Calcium: 9.8 mg/dL (ref 8.9–10.3)
Chloride: 106 mmol/L (ref 98–111)
Creatinine, Ser: 1.23 mg/dL — ABNORMAL HIGH (ref 0.44–1.00)
GFR, Estimated: 47 mL/min — ABNORMAL LOW (ref >60)
Glucose, Bld: 99 mg/dL (ref 70–99)
Potassium: 4.1 mmol/L (ref 3.5–5.1)
Sodium: 139 mmol/L (ref 135–145)

## 2023-10-13 LAB — APTT: aPTT: 58 s — ABNORMAL HIGH (ref 24–36)

## 2023-10-13 LAB — CBC
HCT: 35.6 % — ABNORMAL LOW (ref 36.0–46.0)
Hemoglobin: 11.1 g/dL — ABNORMAL LOW (ref 12.0–15.0)
MCH: 30 pg (ref 26.0–34.0)
MCHC: 31.2 g/dL (ref 30.0–36.0)
MCV: 96.2 fL (ref 80.0–100.0)
Platelets: 210 K/uL (ref 150–400)
RBC: 3.7 MIL/uL — ABNORMAL LOW (ref 3.87–5.11)
RDW: 16.7 % — ABNORMAL HIGH (ref 11.5–15.5)
WBC: 7.5 K/uL (ref 4.0–10.5)
nRBC: 0 % (ref 0.0–0.2)

## 2023-10-13 LAB — HEPARIN LEVEL (UNFRACTIONATED): Heparin Unfractionated: >1.1 [IU]/mL — ABNORMAL HIGH (ref 0.30–0.70)

## 2023-10-13 LAB — MAGNESIUM: Magnesium: 2.3 mg/dL (ref 1.7–2.4)

## 2023-10-13 SURGERY — RIGHT/LEFT HEART CATH AND CORONARY ANGIOGRAPHY
Anesthesia: LOCAL

## 2023-10-13 MED ORDER — MIDAZOLAM HCL 2 MG/2ML IJ SOLN
INTRAMUSCULAR | Status: AC
  Filled 2023-10-13: qty 2

## 2023-10-13 MED ORDER — FREE WATER: 500.0000 mL | Freq: Once | Status: DC

## 2023-10-13 MED ORDER — SODIUM CHLORIDE 0.9% FLUSH: 3.0000 mL | INTRAVENOUS | Status: DC | PRN

## 2023-10-13 MED ORDER — VERAPAMIL HCL 2.5 MG/ML IV SOLN
INTRAVENOUS | Status: AC
  Filled 2023-10-13: qty 2

## 2023-10-13 MED ORDER — VERAPAMIL HCL 2.5 MG/ML IV SOLN: INTRAVENOUS | Status: DC | PRN

## 2023-10-13 MED ORDER — HEPARIN SODIUM (PORCINE) 1000 UNIT/ML IJ SOLN
INTRAMUSCULAR | Status: AC
  Filled 2023-10-13: qty 10

## 2023-10-13 MED ORDER — ACETAMINOPHEN 325 MG PO TABS
650.0000 mg | ORAL_TABLET | ORAL | Status: DC | PRN
  Administered 2023-10-13: 650 mg via ORAL
  Filled 2023-10-13: qty 2

## 2023-10-13 MED ORDER — SODIUM CHLORIDE 0.9 % IV SOLN: 250.0000 mL | INTRAVENOUS | Status: AC | PRN

## 2023-10-13 MED ORDER — SODIUM CHLORIDE 0.9 % IV SOLN
INTRAVENOUS | Status: DC | PRN
  Administered 2023-10-13: 1.75 mg/kg/h via INTRAVENOUS

## 2023-10-13 MED ORDER — MIDAZOLAM HCL 2 MG/2ML IJ SOLN
INTRAMUSCULAR | Status: DC | PRN
  Administered 2023-10-13: 1 mg via INTRAVENOUS

## 2023-10-13 MED ORDER — LIDOCAINE HCL (PF) 1 % IJ SOLN
INTRAMUSCULAR | Status: DC | PRN
  Administered 2023-10-13: 5 mL via INTRADERMAL

## 2023-10-13 MED ORDER — BIVALIRUDIN TRIFLUOROACETATE 250 MG IV SOLR
INTRAVENOUS | Status: AC
  Filled 2023-10-13: qty 250

## 2023-10-13 MED ORDER — ONDANSETRON HCL 4 MG/2ML IJ SOLN: 4.0000 mg | Freq: Four times a day (QID) | INTRAMUSCULAR | Status: DC | PRN

## 2023-10-13 MED ORDER — APIXABAN 5 MG PO TABS
5.0000 mg | ORAL_TABLET | Freq: Two times a day (BID) | ORAL | Status: DC
  Administered 2023-10-13 – 2023-10-15 (×4): 5 mg via ORAL
  Filled 2023-10-13 (×4): qty 1

## 2023-10-13 MED ORDER — SODIUM CHLORIDE 0.9% FLUSH
3.0000 mL | Freq: Two times a day (BID) | INTRAVENOUS | Status: DC
Start: 2023-10-13 — End: 2023-10-15
  Administered 2023-10-13 – 2023-10-15 (×4): 3 mL via INTRAVENOUS

## 2023-10-13 MED ORDER — EMPAGLIFLOZIN 10 MG PO TABS
10.0000 mg | ORAL_TABLET | Freq: Every day | ORAL | Status: DC
  Administered 2023-10-14 – 2023-10-15 (×2): 10 mg via ORAL
  Filled 2023-10-13 (×2): qty 1

## 2023-10-13 MED ORDER — IOHEXOL 350 MG/ML SOLN
INTRAVENOUS | Status: DC | PRN
  Administered 2023-10-13: 50 mL

## 2023-10-13 MED ORDER — HYDRALAZINE HCL 20 MG/ML IJ SOLN: 10.0000 mg | INTRAMUSCULAR | Status: AC | PRN

## 2023-10-13 MED ORDER — FENTANYL CITRATE (PF) 100 MCG/2ML IJ SOLN
INTRAMUSCULAR | Status: DC | PRN
  Administered 2023-10-13: 25 ug via INTRAVENOUS

## 2023-10-13 MED ORDER — LABETALOL HCL 5 MG/ML IV SOLN: 10.0000 mg | INTRAVENOUS | Status: AC | PRN

## 2023-10-13 MED ORDER — FENTANYL CITRATE (PF) 100 MCG/2ML IJ SOLN
INTRAMUSCULAR | Status: AC
  Filled 2023-10-13: qty 2

## 2023-10-13 MED ORDER — ASPIRIN 81 MG PO TBEC: 81.0000 mg | DELAYED_RELEASE_TABLET | Freq: Every day | ORAL | Status: DC

## 2023-10-13 MED ORDER — LIDOCAINE HCL (PF) 1 % IJ SOLN
INTRAMUSCULAR | Status: AC
  Filled 2023-10-13: qty 30

## 2023-10-13 SURGICAL SUPPLY — 11 items
CATH 5FR JL3.5 JR4 ANG PIG MP (CATHETERS) IMPLANT
CATH BALLN WEDGE 5F 110CM (CATHETERS) IMPLANT
GLIDESHEATH SLEND SS 6F .021 (SHEATH) IMPLANT
GUIDEWIRE INQWIRE 1.5J.035X260 (WIRE) IMPLANT
KIT HEART LEFT (KITS) IMPLANT
PACK CARDIAC CATHETERIZATION (CUSTOM PROCEDURE TRAY) ×1 IMPLANT
SHEATH GLIDE SLENDER 4/5FR (SHEATH) IMPLANT
SHEATH PROBE COVER 6X72 (BAG) IMPLANT
TRANSDUCER W/STOPCOCK (MISCELLANEOUS) IMPLANT
TUBING CIL FLEX 10 FLL-RA (TUBING) IMPLANT
WIRE HI TORQ VERSACORE-J 145CM (WIRE) IMPLANT

## 2023-10-13 NOTE — Progress Notes (Signed)
 PT Cancellation Note  Patient Details Name: Christine Jacobson MRN: [id number] DOB: Sep 03, 1955   Cancelled Treatment:    Reason Eval/Treat Not Completed: Patient at procedure or test/unavailable (currently at [facility] cath lab)   Tari CROME [place] 10/13/2023, 8:54 AM Tari KLEIN, DPT Physical Therapist Acute Rehabilitation Services Office: [phone]

## 2023-10-13 NOTE — Progress Notes (Signed)
 Patient stable and A/Ox4 at time of transport off unit to [facility].

## 2023-10-13 NOTE — Plan of Care (Signed)

## 2023-10-13 NOTE — H&P (View-Only) (Signed)
 Progress Note  Patient Name: Christine Jacobson Date of Encounter: 10/13/2023  [facility] Cardiologist: Kardie Tobb, DO   Patient Profile 68 y.o. female with a history of HOCM, chronic diastolic CHF, moderate pulmonary hypertension, persistent atrial fibrillation on chronic anticoagulation with Eliquis , hypertension, hyperlipidemia, GERD with history of GI bleed and chronic anemia.     Subjective   Feeling better this am.  Still fatigued.  NPO for right and LCH today UOP 3.7L yesterday; net -5L since admit   Inpatient Medications    Scheduled Meds:  atorvastatin   80 mg Oral Daily   escitalopram   5 mg Oral QHS   ezetimibe   10 mg Oral Daily   furosemide   40 mg Intravenous BID   metoprolol  succinate  25 mg Oral Daily   pantoprazole   40 mg Oral q morning   potassium chloride   40 mEq Oral Daily   Continuous Infusions:  sodium chloride  10 mL/hr at 10/13/23 0553   heparin  1,150 Units/hr (10/13/23 0653)   PRN Meds: acetaminophen  **OR** acetaminophen , albuterol , ondansetron  **OR** ondansetron  (ZOFRAN ) IV, traZODone    Vital Signs    Vitals:   10/12/23 1332 10/12/23 2056 10/13/23 0416 10/13/23 0500  BP: 124/84 120/82 113/89   Pulse: 76 75 85   Resp: 20 (!) 21 16   Temp: 98 F (36.7 C) 97.7 F (36.5 C) 97.7 F (36.5 C)   TempSrc: Oral Oral Oral   SpO2: 97% 98% 95%   Weight:    80.6 kg  Height:        Intake/Output Summary (Last 24 hours) at 10/13/2023 0803 Last data filed at 10/13/2023 0300 Gross per 24 hour  Intake 537.24 ml  Output 3700 ml  Net -3162.76 ml      10/13/2023    5:00 AM 10/12/2023    5:00 AM 10/11/2023    4:40 PM  Last 3 Weights  Weight (lbs) 177 lb 11.1 oz 182 lb 5.1 oz 186 lb 6.4 oz  Weight (kg) 80.6 kg 82.7 kg 84.55 kg      Telemetry    NSR - Personally Reviewed  ECG    No new EKG to review - Personally Reviewed  Physical Exam   GEN: No acute distress.   Neck: No JVD Cardiac: RRR, no  rubs, or gallops.  Harsh 2/6 SM at RUSB to  LLSB Respiratory: Clear to auscultation bilaterally. GI: Soft, nontender, non-distended  MS: No edema; No deformity. Neuro:  Nonfocal  Psych: Normal affect   Labs    High Sensitivity Troponin:  No results for input(s): TROPONINIHS in the last 720 hours.    Chemistry Recent Labs  Lab 10/11/23 0935 10/12/23 0458 10/13/23 0431  NA 141 141 139  K 4.8 4.4 4.1  CL 109 107 106  CO2 20* 20* 21*  GLUCOSE 103* 100* 99  BUN 24* 27* 33*  CREATININE 1.01* 1.21* 1.23*  CALCIUM  9.9 9.7 9.8  PROT 6.3*  --   --   ALBUMIN  3.8  --   --   AST 46*  --   --   ALT 29  --   --   ALKPHOS 189*  --   --   BILITOT 1.0  --   --   GFRNONAA 60* 48* 47*  ANIONGAP 12 15 13      Hematology Recent Labs  Lab 10/11/23 0935 10/12/23 0458 10/13/23 0431  WBC 5.9 7.3 7.5  RBC 3.80* 3.63* 3.70*  HGB 11.1* 10.8* 11.1*  HCT 36.1 34.3* 35.6*  MCV  95.0 94.5 96.2  MCH 29.2 29.8 30.0  MCHC 30.7 31.5 31.2  RDW 16.5* 16.5* 16.7*  PLT 195 196 210    BNP Recent Labs  Lab 10/11/23 0935 10/12/23 0458  PROBNP 4,647.0* 4,823.0*     DDimer No results for input(s): DDIMER in the last 168 hours.   CHA2DS2-VASc Score = 4   This indicates a 4.8% annual risk of stroke. The patient's score is based upon: CHF History: 1 HTN History: 1 Diabetes History: 0 Stroke History: 0 Vascular Disease History: 0 Age Score: 1 Gender Score: 1      Radiology    DG Chest Portable 1 View Result Date: 10/11/2023 CLINICAL DATA:  Shortness of breath. Bilateral lower extremity edema. Some intermittent chest pressure. EXAM: PORTABLE CHEST 1 VIEW COMPARISON:  05/10/2023 FINDINGS: Lungs are adequately inflated without lobar consolidation or effusion. There is subtle hazy prominence of the central vasculature suggesting mild vascular congestion. Cardiomediastinal silhouette and remainder of the exam is unchanged. IMPRESSION: Suggestion of mild vascular congestion. Electronically Signed   By: Toribio Agreste M.D.   On:  10/11/2023 10:18    Patient Profile     68 y.o. 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), LVOT rest gradient 36 mmHg, mild RV dysfunction - Admitted with several day history of increasing shortness of breath and lower extremity edema - BNP 4823; Chest x-ray mild vascular congestion - Appears to primarily have right-sided heart failure with significant lower extremity edema - currently on Lasix  40mg  IV BID - UOP 3.7 L yesterday and net - 5 L overall - SCr 1 on admission>> 1.23 today with diuresis; K+ 4.1; Mag 2.3 - Continue IV Lasix  40 mg twice daily for now with further adjustments based on filling pressures at cath - Follow strict I's and O's, renal function and daily weights while diuresing - Patient was referred to Dr. Santo for HOCM clinic but never followed through and canceled appointment>> she needs to follow-up with him outpatient - Will need to be very cautious with her blood pressure given her known HOCM as we diurese>>  Would recommend gentle diuresis to prevent an acute drop in preload resulting in worsening of her LVOT gradient  - Continue Toprol  XL 25mg  daily - add Jardiance  10mg  daily in am tomorrow after cath   Chest pain Elevated  troponin Abnormal stress Myoview - She has been having chest pain intermittently for some time although she cannot give me a true length of time - Had a Myoview done in 2023 which was read as high risk: No ischemia normal LV function but 3 times daily 1.28 and concerning for multivessel CAD -High-sensitivity troponin mildly elevated with flat trend at 35 and 34 which is likely demand ischemia in the setting of CHF but given her episodes of chest pain need to consider  coronary ischemia - She continues to have episodes of intermittent chest pain which could be related to HOCM or CHF but also worrisome for ischemia which also could be contributing to her recurrent episodes of CHF - Chest CT 05/10/2023 showed coronary artery calcifications - I recommended we proceed with a right and left heart catheterization to define coronary anatomy and assess filling pressures.  This will help guide diuresis in the setting of severe HOCM.   -Informed Consent   Shared Decision Making/Informed Consent{ The risks [stroke (1 in 1000), death (1 in 1000), kidney failure [usually temporary] (1 in 500), bleeding (1 in 200), allergic reaction [possibly serious] (1 in 200)], benefits (diagnostic support and management of coronary artery disease) and alternatives of a cardiac catheterization were discussed in detail with Ms. Affinito and she is willing to proceed. - She is NPO for cath today and [facility] is here for transport - start ASA 81mg  daily - Continue IV Heparin  gtt, Atorvastatin  80mg  daily, Zetia  10mg  daily and Toprol  XL 25mg  daily  - Lipids: LDL 118, HDL 52 and Staggs 111 on 05/12/2023 - Repeat FLP in a.m.   Persistent atrial fibrillation - Has a history of atrial fibrillation and decision was made for rate control given her severe LAE - Remains in atrial fibrillation rate controlled - CHADS2 Vasc score 4 - Eliquis  on hold for cath - continue IV Heparin  for now per pharmacy - continue Toprol  XL 25mg  daily       For questions or updates, please contact [facility] Please consult [email or web address] for contact info under        Signed, Wilbert Bihari, MD  10/13/2023, 8:03 AM

## 2023-10-13 NOTE — Progress Notes (Signed)
 TR band successfully removed. Site is level 0, clean/dry/intact and +2 radial pulse palpable. Gauze and tegaderm applied and patient given instructions to not use that arm for 24 hours and call nurse if bleeding is noticed.

## 2023-10-13 NOTE — Progress Notes (Signed)
 Report given to [facility] RN from this RN. [facility] called and transportation back to [facility] arranged once TR Band removed

## 2023-10-13 NOTE — Interval H&P Note (Signed)
 History and Physical Interval Note:  10/13/2023 11:01 AM  Christine Jacobson  has presented today for surgery, with the diagnosis of heart failure.  The various methods of treatment have been discussed with the patient and family. After consideration of risks, benefits and other options for treatment, the patient has consented to  Procedure(s): RIGHT/LEFT HEART CATH AND CORONARY ANGIOGRAPHY (N/A) as a surgical intervention.  The patient's history has been reviewed, patient examined, no change in status, stable for surgery.  I have reviewed the patient's chart and labs.  Questions were answered to the patient's satisfaction.     Alvilda Mckenna [employer]

## 2023-10-13 NOTE — Progress Notes (Signed)
 PHARMACY - ANTICOAGULATION CONSULT NOTE  Pharmacy Consult for heparin  >> apixaban  Indication: atrial fibrillation  Allergies  Allergen Reactions   Pork-Derived Products Other (See Comments)    Religious preference    Patient Measurements: Height: 5' 2 (157.5 cm) Weight: 80.6 kg (177 lb 11.1 oz) IBW/kg (Calculated) : 50.1 HEPARIN  DW (KG): 69.2  Vital Signs: Temp: 97.7 F (36.5 C) (09/26 0416) Temp Source: Oral (09/26 0416) BP: 112/73 (09/26 1415) Pulse Rate: 71 (09/26 1415)  Labs: Recent Labs    10/11/23 0935 10/12/23 0458 10/13/23 0431  HGB 11.1* 10.8* 11.1*  HCT 36.1 34.3* 35.6*  PLT 195 196 210  APTT  --   --  58*  HEPARINUNFRC  --   --  >1.10*  CREATININE 1.01* 1.21* 1.23*    Estimated Creatinine Clearance: 41.9 mL/min (A) (by C-G formula based on SCr of 1.23 mg/dL (H)).   Medical History: 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   2D ECHO - EF >55%, normal   HTN (hypertension)    Hyperprolactinemia    dx in her 18, took meds, self d/c a while back   Liver function test abnormality    normal when repeated   Mild hyperlipidemia    Obesity    Palpitations    negative stress echo in November 2012 with normal LV function; mild LVH, proximal septal thickening with narrow LVOT and mild gradient; mild MR and TR; Cardionet showed PACs in November 2012   Pyoderma gangrenosa    Shortness of breath 07/11/2011   MET TEST    Medications:  Medications Prior to Admission  Medication Sig Dispense Refill Last Dose/Taking   apixaban  (ELIQUIS ) 5 MG TABS tablet Take 1 tablet (5 mg total) by mouth 2 (two) times daily. 60 tablet 5 10/10/2023 at  9:00 PM   ascorbic acid  (VITAMIN C ) 500 MG tablet Take 1 tablet (500 mg total) by mouth daily for 30 days 100  tablet 0 10/10/2023 Morning   aspirin  EC 81 MG tablet Take 81 mg by mouth daily as needed for mild pain (pain score 1-3) (or headaches). Swallow whole.   Unknown   furosemide  (LASIX ) 40 MG tablet Take 1 tablet (40 mg total) by mouth daily. For weight gains more than 3 pounds in 1 day or 5 pounds in 1 week. Stop torsemide  (Patient taking differently: Take 40 mg by mouth in the morning.) 90 tablet 1 10/10/2023 Morning   metoprolol  succinate (TOPROL -XL) 25 MG 24 hr tablet Take 1 tablet (25 mg total) by mouth daily. 90 tablet 1 10/10/2023 at  9:00 AM   Potassium Chloride  ER 20 MEQ TBCR Take 1 tablet (20 mEq total) by mouth every other day. (Patient taking differently: Take 20 mEq by mouth in the morning.) 45 tablet 2 10/10/2023 Morning   Zinc  Sulfate 220 (50 Zn) MG TABS Take 1 tablet (220 mg total) by mouth daily for 30 days 100 tablet 0 10/10/2023 Morning   allopurinol  (ZYLOPRIM ) 100 MG tablet Take 0.5 tablets (50 mg total) by mouth daily for gout (Patient not taking: Reported on 10/11/2023) 45 tablet 3 Not Taking   allopurinol  (ZYLOPRIM ) 100 MG tablet Take 1 tablet (100 mg total) by mouth daily. (Patient not taking: Reported on 10/11/2023) 90 tablet 3 Not Taking   atorvastatin  (LIPITOR) 80 MG tablet  Take 1 tablet (80 mg total) by mouth daily. 90 tablet 3 Unknown   escitalopram  (LEXAPRO ) 5 MG tablet Take 1 tablet (5 mg total) by mouth daily. (Patient not taking: Reported on 10/11/2023) 30 tablet 0 Not Taking   pantoprazole  (PROTONIX ) 40 MG tablet Take 1 tablet (40 mg total) by mouth daily. (Patient not taking: Reported on 10/11/2023) 90 tablet 3 Not Taking   pantoprazole  (PROTONIX ) 40 MG tablet Take 1 tablet (40 mg total) by mouth every morning. (Patient not taking: Reported on 10/11/2023) 90 tablet 3 Not Taking   predniSONE  (DELTASONE ) 10 MG tablet Day 1: Take 2 tablets by mouth at breakfast, 1 at lunch and 1 at dinner, and 2 at bedtime.  Day 2: Take 1 tablet by mouth at breakfast, 1 at lunch and 1 at dinner, and 2  tablets at bedtime.  Day 3: Take 1 tablet at breakfast, 1 at lunch, and 1 at dinner and 1 at bedtime.  Day 4: Take 1 tab by mouth at breakfast, 1 at lunch and 1 at bedtime.  Day 5: Take 1 tab at breakfast and 1 at bedtime.  Day 6: Take 1 tab at breakfast. (Patient not taking: Reported on 10/11/2023) 21 tablet 0 Not Taking   Scheduled:   [MAR Hold] aspirin  EC  81 mg Oral Daily   [MAR Hold] atorvastatin   80 mg Oral Daily   [MAR Hold] empagliflozin   10 mg Oral Daily   [MAR Hold] escitalopram   5 mg Oral QHS   [MAR Hold] ezetimibe   10 mg Oral Daily   free water   500 mL Oral Once   [MAR Hold] furosemide   40 mg Intravenous BID   [MAR Hold] metoprolol  succinate  25 mg Oral Daily   [MAR Hold] pantoprazole   40 mg Oral q morning   [MAR Hold] potassium chloride   40 mEq Oral Daily    Assessment: 68 YO female presenting with shortness of breath, lower extremity edema. Patient also complains of intermittent chest pain since 2023 without resolution with medical management. Patient has a history of atrial fibrillation on Eliquis  PTA (last dose given 9/25 @0900  while admitted). Cardiology following and planning [facility] tomorrow (9/26). Pharmacy has been consulted for heparin  management.  Noted in patient's chart that pork-derived products are contraindicated for religious purposes. I spoke with the patient regarding the route of administration, why heparin  is a good option for periop management of anticoagulation and she is agreeable to receive heparin  at this time.   Patient went to cath lab for [facility], received a total of 42 mg (1.75 mg/kg/hr) of bivalirudin  during case. Per [facility] consult message, stopping heparin  infusion, okay to restart home apixaban  8 hours post-cath.   Goal of Therapy:  Monitor platelets by anticoagulation protocol: Yes   Plan:  Start PTA apixaban  5 mg twice daily 8 hour after cath (~ 2000 10/13/23) Monitor for signs/symptoms of bleeding   Thank you for allowing pharmacy to be a part of  this patient's care.   Nidia Schaffer, PharmD PGY2 Cardiology Pharmacy Resident  Please check AMION for all [facility] Pharmacy phone numbers After 10:00 PM, call Main Pharmacy [phone] 10/13/2023 2:32 PM

## 2023-10-13 NOTE — Progress Notes (Addendum)
 Progress Note  Patient Name: Christine Jacobson Date of Encounter: 10/13/2023  [facility] Cardiologist: Kardie Tobb, DO   Patient Profile 68 y.o. female with a history of HOCM, chronic diastolic CHF, moderate pulmonary hypertension, persistent atrial fibrillation on chronic anticoagulation with Eliquis , hypertension, hyperlipidemia, GERD with history of GI bleed and chronic anemia.     Subjective   Feeling better this am.  Still fatigued.  NPO for right and LCH today UOP 3.7L yesterday; net -5L since admit   Inpatient Medications    Scheduled Meds:  atorvastatin   80 mg Oral Daily   escitalopram   5 mg Oral QHS   ezetimibe   10 mg Oral Daily   furosemide   40 mg Intravenous BID   metoprolol  succinate  25 mg Oral Daily   pantoprazole   40 mg Oral q morning   potassium chloride   40 mEq Oral Daily   Continuous Infusions:  sodium chloride  10 mL/hr at 10/13/23 0553   heparin  1,150 Units/hr (10/13/23 0653)   PRN Meds: acetaminophen  **OR** acetaminophen , albuterol , ondansetron  **OR** ondansetron  (ZOFRAN ) IV, traZODone    Vital Signs    Vitals:   10/12/23 1332 10/12/23 2056 10/13/23 0416 10/13/23 0500  BP: 124/84 120/82 113/89   Pulse: 76 75 85   Resp: 20 (!) 21 16   Temp: 98 F (36.7 C) 97.7 F (36.5 C) 97.7 F (36.5 C)   TempSrc: Oral Oral Oral   SpO2: 97% 98% 95%   Weight:    80.6 kg  Height:        Intake/Output Summary (Last 24 hours) at 10/13/2023 0803 Last data filed at 10/13/2023 0300 Gross per 24 hour  Intake 537.24 ml  Output 3700 ml  Net -3162.76 ml      10/13/2023    5:00 AM 10/12/2023    5:00 AM 10/11/2023    4:40 PM  Last 3 Weights  Weight (lbs) 177 lb 11.1 oz 182 lb 5.1 oz 186 lb 6.4 oz  Weight (kg) 80.6 kg 82.7 kg 84.55 kg      Telemetry    NSR - Personally Reviewed  ECG    No new EKG to review - Personally Reviewed  Physical Exam   GEN: No acute distress.   Neck: No JVD Cardiac: RRR, no  rubs, or gallops.  Harsh 2/6 SM at RUSB to  LLSB Respiratory: Clear to auscultation bilaterally. GI: Soft, nontender, non-distended  MS: No edema; No deformity. Neuro:  Nonfocal  Psych: Normal affect   Labs    High Sensitivity Troponin:  No results for input(s): TROPONINIHS in the last 720 hours.    Chemistry Recent Labs  Lab 10/11/23 0935 10/12/23 0458 10/13/23 0431  NA 141 141 139  K 4.8 4.4 4.1  CL 109 107 106  CO2 20* 20* 21*  GLUCOSE 103* 100* 99  BUN 24* 27* 33*  CREATININE 1.01* 1.21* 1.23*  CALCIUM  9.9 9.7 9.8  PROT 6.3*  --   --   ALBUMIN  3.8  --   --   AST 46*  --   --   ALT 29  --   --   ALKPHOS 189*  --   --   BILITOT 1.0  --   --   GFRNONAA 60* 48* 47*  ANIONGAP 12 15 13      Hematology Recent Labs  Lab 10/11/23 0935 10/12/23 0458 10/13/23 0431  WBC 5.9 7.3 7.5  RBC 3.80* 3.63* 3.70*  HGB 11.1* 10.8* 11.1*  HCT 36.1 34.3* 35.6*  MCV  95.0 94.5 96.2  MCH 29.2 29.8 30.0  MCHC 30.7 31.5 31.2  RDW 16.5* 16.5* 16.7*  PLT 195 196 210    BNP Recent Labs  Lab 10/11/23 0935 10/12/23 0458  PROBNP 4,647.0* 4,823.0*     DDimer No results for input(s): DDIMER in the last 168 hours.   CHA2DS2-VASc Score = 4   This indicates a 4.8% annual risk of stroke. The patient's score is based upon: CHF History: 1 HTN History: 1 Diabetes History: 0 Stroke History: 0 Vascular Disease History: 0 Age Score: 1 Gender Score: 1      Radiology    DG Chest Portable 1 View Result Date: 10/11/2023 CLINICAL DATA:  Shortness of breath. Bilateral lower extremity edema. Some intermittent chest pressure. EXAM: PORTABLE CHEST 1 VIEW COMPARISON:  05/10/2023 FINDINGS: Lungs are adequately inflated without lobar consolidation or effusion. There is subtle hazy prominence of the central vasculature suggesting mild vascular congestion. Cardiomediastinal silhouette and remainder of the exam is unchanged. IMPRESSION: Suggestion of mild vascular congestion. Electronically Signed   By: Toribio Agreste M.D.   On:  10/11/2023 10:18    Patient Profile     68 y.o. 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), LVOT rest gradient 36 mmHg, mild RV dysfunction - Admitted with several day history of increasing shortness of breath and lower extremity edema - BNP 4823; Chest x-ray mild vascular congestion - Appears to primarily have right-sided heart failure with significant lower extremity edema - currently on Lasix  40mg  IV BID - UOP 3.7 L yesterday and net - 5 L overall - SCr 1 on admission>> 1.23 today with diuresis; K+ 4.1; Mag 2.3 - Continue IV Lasix  40 mg twice daily for now with further adjustments based on filling pressures at cath - Follow strict I's and O's, renal function and daily weights while diuresing - Patient was referred to Dr. Santo for HOCM clinic but never followed through and canceled appointment>> she needs to follow-up with him outpatient - Will need to be very cautious with her blood pressure given her known HOCM as we diurese>>  Would recommend gentle diuresis to prevent an acute drop in preload resulting in worsening of her LVOT gradient  - Continue Toprol  XL 25mg  daily - add Jardiance  10mg  daily in am tomorrow after cath   Chest pain Elevated  troponin Abnormal stress Myoview - She has been having chest pain intermittently for some time although she cannot give me a true length of time - Had a Myoview done in 2023 which was read as high risk: No ischemia normal LV function but 3 times daily 1.28 and concerning for multivessel CAD -High-sensitivity troponin mildly elevated with flat trend at 35 and 34 which is likely demand ischemia in the setting of CHF but given her episodes of chest pain need to consider  coronary ischemia - She continues to have episodes of intermittent chest pain which could be related to HOCM or CHF but also worrisome for ischemia which also could be contributing to her recurrent episodes of CHF - Chest CT 05/10/2023 showed coronary artery calcifications - I recommended we proceed with a right and left heart catheterization to define coronary anatomy and assess filling pressures.  This will help guide diuresis in the setting of severe HOCM.   -Informed Consent   Shared Decision Making/Informed Consent{ The risks [stroke (1 in 1000), death (1 in 1000), kidney failure [usually temporary] (1 in 500), bleeding (1 in 200), allergic reaction [possibly serious] (1 in 200)], benefits (diagnostic support and management of coronary artery disease) and alternatives of a cardiac catheterization were discussed in detail with Ms. Affinito and she is willing to proceed. - She is NPO for cath today and [facility] is here for transport - start ASA 81mg  daily - Continue IV Heparin  gtt, Atorvastatin  80mg  daily, Zetia  10mg  daily and Toprol  XL 25mg  daily  - Lipids: LDL 118, HDL 52 and Staggs 111 on 05/12/2023 - Repeat FLP in a.m.   Persistent atrial fibrillation - Has a history of atrial fibrillation and decision was made for rate control given her severe LAE - Remains in atrial fibrillation rate controlled - CHADS2 Vasc score 4 - Eliquis  on hold for cath - continue IV Heparin  for now per pharmacy - continue Toprol  XL 25mg  daily       For questions or updates, please contact [facility] Please consult [email or web address] for contact info under        Signed, Wilbert Bihari, MD  10/13/2023, 8:03 AM

## 2023-10-13 NOTE — Progress Notes (Signed)
 PHARMACY - ANTICOAGULATION CONSULT NOTE  Pharmacy Consult for heparin  Indication: atrial fibrillation  Allergies  Allergen Reactions   Pork-Derived Products Other (See Comments)    Religious preference    Patient Measurements: Height: 5' 2 (157.5 cm) Weight: 80.6 kg (177 lb 11.1 oz) IBW/kg (Calculated) : 50.1 HEPARIN  DW (KG): 69.2  Vital Signs: Temp: 97.7 F (36.5 C) (09/26 0416) Temp Source: Oral (09/26 0416) BP: 113/89 (09/26 0416) Pulse Rate: 85 (09/26 0416)  Labs: Recent Labs    10/11/23 0935 10/12/23 0458 10/13/23 0431  HGB 11.1* 10.8* 11.1*  HCT 36.1 34.3* 35.6*  PLT 195 196 210  APTT  --   --  58*  HEPARINUNFRC  --   --  >1.10*  CREATININE 1.01* 1.21* 1.23*    Estimated Creatinine Clearance: 41.9 mL/min (A) (by C-G formula based on SCr of 1.23 mg/dL (H)).   Medical History: 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   2D ECHO - EF >55%, normal   HTN (hypertension)    Hyperprolactinemia    dx in her 18, took meds, self d/c a while back   Liver function test abnormality    normal when repeated   Mild hyperlipidemia    Obesity    Palpitations    negative stress echo in November 2012 with normal LV function; mild LVH, proximal septal thickening with narrow LVOT and mild gradient; mild MR and TR; Cardionet showed PACs in November 2012   Pyoderma gangrenosa    Shortness of breath 07/11/2011   MET TEST    Medications:  Medications Prior to Admission  Medication Sig Dispense Refill Last Dose/Taking   apixaban  (ELIQUIS ) 5 MG TABS tablet Take 1 tablet (5 mg total) by mouth 2 (two) times daily. 60 tablet 5 10/10/2023 at  9:00 PM   ascorbic acid  (VITAMIN C ) 500 MG tablet Take 1 tablet (500 mg total) by mouth daily for 30 days 100 tablet 0  10/10/2023 Morning   aspirin  EC 81 MG tablet Take 81 mg by mouth daily as needed for mild pain (pain score 1-3) (or headaches). Swallow whole.   Unknown   furosemide  (LASIX ) 40 MG tablet Take 1 tablet (40 mg total) by mouth daily. For weight gains more than 3 pounds in 1 day or 5 pounds in 1 week. Stop torsemide  (Patient taking differently: Take 40 mg by mouth in the morning.) 90 tablet 1 10/10/2023 Morning   metoprolol  succinate (TOPROL -XL) 25 MG 24 hr tablet Take 1 tablet (25 mg total) by mouth daily. 90 tablet 1 10/10/2023 at  9:00 AM   Potassium Chloride  ER 20 MEQ TBCR Take 1 tablet (20 mEq total) by mouth every other day. (Patient taking differently: Take 20 mEq by mouth in the morning.) 45 tablet 2 10/10/2023 Morning   Zinc  Sulfate 220 (50 Zn) MG TABS Take 1 tablet (220 mg total) by mouth daily for 30 days 100 tablet 0 10/10/2023 Morning   allopurinol  (ZYLOPRIM ) 100 MG tablet Take 0.5 tablets (50 mg total) by mouth daily for gout (Patient not taking: Reported on 10/11/2023) 45 tablet 3 Not Taking   allopurinol  (ZYLOPRIM ) 100 MG tablet Take 1 tablet (100 mg total) by mouth daily. (Patient not taking: Reported on 10/11/2023) 90 tablet 3 Not Taking   atorvastatin  (LIPITOR) 80 MG tablet Take 1  tablet (80 mg total) by mouth daily. 90 tablet 3 Unknown   escitalopram  (LEXAPRO ) 5 MG tablet Take 1 tablet (5 mg total) by mouth daily. (Patient not taking: Reported on 10/11/2023) 30 tablet 0 Not Taking   pantoprazole  (PROTONIX ) 40 MG tablet Take 1 tablet (40 mg total) by mouth daily. (Patient not taking: Reported on 10/11/2023) 90 tablet 3 Not Taking   pantoprazole  (PROTONIX ) 40 MG tablet Take 1 tablet (40 mg total) by mouth every morning. (Patient not taking: Reported on 10/11/2023) 90 tablet 3 Not Taking   predniSONE  (DELTASONE ) 10 MG tablet Day 1: Take 2 tablets by mouth at breakfast, 1 at lunch and 1 at dinner, and 2 at bedtime.  Day 2: Take 1 tablet by mouth at breakfast, 1 at lunch and 1 at dinner, and 2 tablets  at bedtime.  Day 3: Take 1 tablet at breakfast, 1 at lunch, and 1 at dinner and 1 at bedtime.  Day 4: Take 1 tab by mouth at breakfast, 1 at lunch and 1 at bedtime.  Day 5: Take 1 tab at breakfast and 1 at bedtime.  Day 6: Take 1 tab at breakfast. (Patient not taking: Reported on 10/11/2023) 21 tablet 0 Not Taking   Scheduled:   atorvastatin   80 mg Oral Daily   escitalopram   5 mg Oral QHS   ezetimibe   10 mg Oral Daily   furosemide   40 mg Intravenous BID   metoprolol  succinate  25 mg Oral Daily   pantoprazole   40 mg Oral q morning   potassium chloride   40 mEq Oral Daily    Assessment: 68 YO female presenting with shortness of breath, lower extremity edema. Patient also complains of intermittent chest pain since 2023 without resolution with medical management. Patient has a history of atrial fibrillation on Eliquis  PTA (last dose given 9/25 @0900  while admitted). Cardiology following and planning [facility] tomorrow (9/26). Pharmacy has been consulted for heparin  management.  Noted in patient's chart that pork-derived products are contraindicated for religious purposes. I spoke with the patient regarding the route of administration, why heparin  is a good option for periop management of anticoagulation and she is agreeable to receive heparin  at this time.   Today, 10/13/23: Heparin  level > 1.1 (remains falsely elevated due to recent DOAC use) aPTT = 58 sec (subtherapeutic) with heparin  gtt @ 1000 units/hr Hgb 9.1, plts wnl Scr 1.23 (~BL), CrCl 39ml/min No s/sx of bleeding reported; no interruption in therapy  Goal of Therapy:  Heparin  level 0.3-0.7 units/ml aPTT 66-102 seconds Monitor platelets by anticoagulation protocol: Yes   Plan:  INcrease heparin  infusion to 1150 units/hr  RN reported [facility] to pick up patient at 8am to take for R/L [facility] today F/U anticoagulation plans after cath   Arvin Gauss, PharmD Clinical Pharmacist  9/26/20256:58 AM

## 2023-10-13 NOTE — Progress Notes (Addendum)
 [facility] arrived to transport patient back to [facility]. Report given and [facility] RN aware.

## 2023-10-13 NOTE — Progress Notes (Signed)
 PROGRESS NOTE    Christine Jacobson  FMW:[id number] DOB: 1955-12-17 DOA: 10/11/2023 PCP: [name], NP   Chief Complaint  Patient presents with   Shortness of Breath    Brief Narrative:  Patient 68 year old female history of hypertension, hyperlipidemia, obesity, HOCM, HFpEF admitted to the hospital with a 2-day history of shortness of breath, lower extremity edema, palpitations, concern for acute on chronic heart failure with preserved EF.  Patient also with some complaints of some intermittent chest pain.   Assessment & Plan:   Principal Problem:   Acute diastolic (congestive) heart failure (HCC) Active Problems:   Mild hyperlipidemia   Chest pain of uncertain etiology   Hypertension   HOCM (hypertrophic obstructive cardiomyopathy) (HCC)   Pulmonary HTN (HCC)   Acute exacerbation of CHF (congestive heart failure) (HCC)   MDD (major depressive disorder)   Acute on chronic diastolic CHF (congestive heart failure) (HCC)   Elevated troponin   Abnormal stress test   Coronary artery calcification seen on CAT scan  #1 acute on chronic diastolic CHF/HOCM/pulmonary hypertension -Patient presented with shortness of breath x 2 days, lower extremity edema, palpitations, some intermittent chest pain. -Patient still with shortness of breath. - BNP noted to be elevated on admission. - Chest x-ray concerning for vascular congestion. - 2D echo pending. - Patient currently on Lasix  40 mg IV every 12 hours with urine output of 3.7 L over the past 24 hours. - Patient seen in consultation by cardiology who noted a degree of noncompliance with medical therapy as well as noncompliance with multiple cardiac follow-ups. - Cardiology recommending continuation of diuresis with Lasix  40 mg IV every 12 hours, strict I's and O's, daily weights.  - Patient noted to have been referred to Dr.Chandrasekhar for HOCM clinic but never followed up through and canceled appointments. - Cardiology  recommended gentle diuresis to prevent an acute drop in preload resulting worsening of LVOT gradient. -Patient underwent cardiac catheterization today, 10/13/2023 which showed normal coronaries, normal filling pressures, minimal gradient from the body of the LV to the aorta. - Continue Toprol -XL 25 mg daily. - Cardiology recommending addition of Jardiance  10 mg daily after cardiac catheterization.  2.  Chest pain/elevated troponin/history of abnormal stress Myoview -Patient noted to have told cardiology that she had been having some intermittent chest pain for some time. - Patient noted by cardiology to have a Myoview done in 2023 read as high risk, no ischemia, normal LV function - High-sensitivity troponin mildly elevated but flattened. - Per cardiology patient with some intermittent complaints of chest pain which could be related to HOCM or CHF, also concern for ischemia.  - Cardiology recommended right and left heart catheterization to define coronary anatomy and assess filling pressures which was done today 10/13/2023 which showed normal coronaries, normal filling pressures, minimal gradient from the body of the LV to the aorta.   - Per cardiology.    3.  Persistent A-fib -Patient with a history of A-fib and decision made for rate control given his severe left atrial enlargement. - Cha2ds2vasc SCORE 4 - Rate controlled.  - Continue home regimen Toprol -XL 25 mg daily resumed per cardiology recommendations. - Patient was on Eliquis  5 mg twice daily and Eliquis  held in anticipation of cardiac catheterization and patient placed on IV heparin  per pharmacy.   4.  Hyperlipidemia -LDL of 118, HDL of 52. - Continue Lipitor 80 mg daily, Zetia  10 mg daily.   5.  GERD - Continue PPI.   6.  Chronic  normocytic anemia -Stable.    DVT prophylaxis: Eliquis >>> heparin  Code Status: Full Family Communication: Updated patient.  No family at bedside. Disposition: Likely home when clinically improved  and cleared by cardiology.  Status is: Inpatient Remains inpatient appropriate because: Severity of illness.   Consultants:  Cardiology: Dr. Shlomo 10/12/2023  Procedures:  Chest x-ray 10/11/2023 2D echo pending Cardiac catheterization 10/13/2023 Dr. Rolan  Antimicrobials:  Anti-infectives (From admission, onward)    None         Subjective: Patient just returned from cardiac catheterization.  Feels shortness of breath has improved.  Denies any chest pain.  No abdominal pain.   Objective: Vitals:   10/13/23 1430 10/13/23 1445 10/13/23 1500 10/13/23 1515  BP: (!) 107/40 118/87 117/85 (!) 111/92  Pulse: 88 84 84 85  Resp: (!) 22 (!) 22 (!) 24 16  Temp:      TempSrc:      SpO2: 98% 97% 96% 96%  Weight:      Height:        Intake/Output Summary (Last 24 hours) at 10/13/2023 1614 Last data filed at 10/13/2023 1600 Gross per 24 hour  Intake 388.73 ml  Output 1400 ml  Net -1011.27 ml   Filed Weights   10/11/23 1640 10/12/23 0500 10/13/23 0500  Weight: 84.6 kg 82.7 kg 80.6 kg    Examination:  General exam: Appears calm and comfortable  Respiratory system: Lungs clear to auscultation bilaterally.  No wheezes, no crackles, no rhonchi.  Fair air movement.  Speaking in full sentences.  Cardiovascular system: Irregularly irregular with 3/6 SEM.  No significant lower extremity edema.  Gastrointestinal system: Abdomen is soft, nontender, nondistended, positive bowel sounds.  No rebound.  No guarding.  Central nervous system: Alert and oriented. No focal neurological deficits. Extremities: Symmetric 5 x 5 power. Skin: No rashes, lesions or ulcers Psychiatry: Judgement and insight appear normal. Mood & affect appropriate.     Data Reviewed: I have personally reviewed following labs and imaging studies  CBC: Recent Labs  Lab 10/11/23 0935 10/12/23 0458 10/13/23 0431  WBC 5.9 7.3 7.5  NEUTROABS 4.3  --   --   HGB 11.1* 10.8* 11.1*  HCT 36.1 34.3* 35.6*  MCV 95.0  94.5 96.2  PLT 195 196 210    Basic Metabolic Panel: Recent Labs  Lab 10/11/23 0935 10/12/23 0458 10/13/23 0431  NA 141 141 139  K 4.8 4.4 4.1  CL 109 107 106  CO2 20* 20* 21*  GLUCOSE 103* 100* 99  BUN 24* 27* 33*  CREATININE 1.01* 1.21* 1.23*  CALCIUM  9.9 9.7 9.8  MG  --   --  2.3    GFR: Estimated Creatinine Clearance: 41.9 mL/min (A) (by C-G formula based on SCr of 1.23 mg/dL (H)).  Liver Function Tests: Recent Labs  Lab 10/11/23 0935  AST 46*  ALT 29  ALKPHOS 189*  BILITOT 1.0  PROT 6.3*  ALBUMIN  3.8    CBG: No results for input(s): GLUCAP in the last 168 hours.   No results found for this or any previous visit (from the past 240 hours).       Radiology Studies: CARDIAC CATHETERIZATION Result Date: 10/13/2023 1. Normal coronaries. 2. Normal filling pressures. 3. Minimal gradient from the body of the LV to the aorta. Ongoing medical management.        Scheduled Meds:  apixaban   5 mg Oral BID   aspirin  EC  81 mg Oral Daily   atorvastatin   80 mg Oral Daily   [  START ON 10/14/2023] empagliflozin   10 mg Oral Daily   escitalopram   5 mg Oral QHS   ezetimibe   10 mg Oral Daily   free water   500 mL Oral Once   furosemide   40 mg Intravenous BID   metoprolol  succinate  25 mg Oral Daily   pantoprazole   40 mg Oral q morning   potassium chloride   40 mEq Oral Daily   sodium chloride  flush  3 mL Intravenous Q12H   Continuous Infusions:  sodium chloride        LOS: 2 days    Time spent: 40 minutes    Toribio Hummer, MD [facility]   To contact the attending provider between 7A-7P or the covering provider during after hours 7P-7A, please log into the web site [email or web address] and access using universal [place] password for that web site. If you do not have the password, please call the hospital operator.  10/13/2023, 4:14 PM

## 2023-10-14 ENCOUNTER — Inpatient Hospital Stay (HOSPITAL_COMMUNITY)

## 2023-10-14 ENCOUNTER — Other Ambulatory Visit (HOSPITAL_COMMUNITY): Payer: Self-pay

## 2023-10-14 DIAGNOSIS — I5033 Acute on chronic diastolic (congestive) heart failure: Secondary | ICD-10-CM | POA: Diagnosis not present

## 2023-10-14 DIAGNOSIS — I421 Obstructive hypertrophic cardiomyopathy: Secondary | ICD-10-CM | POA: Diagnosis not present

## 2023-10-14 DIAGNOSIS — I272 Pulmonary hypertension, unspecified: Secondary | ICD-10-CM | POA: Diagnosis not present

## 2023-10-14 DIAGNOSIS — I2510 Atherosclerotic heart disease of native coronary artery without angina pectoris: Secondary | ICD-10-CM

## 2023-10-14 DIAGNOSIS — I5031 Acute diastolic (congestive) heart failure: Secondary | ICD-10-CM

## 2023-10-14 DIAGNOSIS — E785 Hyperlipidemia, unspecified: Secondary | ICD-10-CM | POA: Diagnosis not present

## 2023-10-14 DIAGNOSIS — R079 Chest pain, unspecified: Secondary | ICD-10-CM | POA: Diagnosis not present

## 2023-10-14 LAB — BASIC METABOLIC PANEL WITH GFR
Anion gap: 15 (ref 5–15)
BUN: 31 mg/dL — ABNORMAL HIGH (ref 8–23)
CO2: 23 mmol/L (ref 22–32)
Calcium: 9.9 mg/dL (ref 8.9–10.3)
Chloride: 102 mmol/L (ref 98–111)
Creatinine, Ser: 1.16 mg/dL — ABNORMAL HIGH (ref 0.44–1.00)
GFR, Estimated: 50 mL/min — ABNORMAL LOW (ref >60)
Glucose, Bld: 93 mg/dL (ref 70–99)
Potassium: 4.2 mmol/L (ref 3.5–5.1)
Sodium: 140 mmol/L (ref 135–145)

## 2023-10-14 LAB — LIPID PANEL
Cholesterol: 178 mg/dL (ref 0–200)
HDL: 61 mg/dL (ref >40)
LDL Cholesterol: 94 mg/dL (ref 0–99)
Total CHOL/HDL Ratio: 2.9 ratio
Triglycerides: 116 mg/dL (ref <150)
VLDL: 23 mg/dL (ref 0–40)

## 2023-10-14 LAB — CBC
HCT: 36.8 % (ref 36.0–46.0)
Hemoglobin: 11.7 g/dL — ABNORMAL LOW (ref 12.0–15.0)
MCH: 30.2 pg (ref 26.0–34.0)
MCHC: 31.8 g/dL (ref 30.0–36.0)
MCV: 94.8 fL (ref 80.0–100.0)
Platelets: 220 K/uL (ref 150–400)
RBC: 3.88 MIL/uL (ref 3.87–5.11)
RDW: 16.5 % — ABNORMAL HIGH (ref 11.5–15.5)
WBC: 7.7 K/uL (ref 4.0–10.5)
nRBC: 0 % (ref 0.0–0.2)

## 2023-10-14 LAB — ECHOCARDIOGRAM COMPLETE
Area-P 1/2: 2.61 cm2
Est EF: >75
Height: 62 in
MV M vel: 5.22 m/s
MV Peak grad: 109 mmHg
S' Lateral: 2.3 cm
Weight: 2843.05 [oz_av]

## 2023-10-14 LAB — MAGNESIUM: Magnesium: 2.2 mg/dL (ref 1.7–2.4)

## 2023-10-14 MED ORDER — ACETAMINOPHEN 325 MG PO TABS: 650.0000 mg | ORAL_TABLET | Freq: Four times a day (QID) | ORAL | Status: AC | PRN

## 2023-10-14 MED ORDER — FUROSEMIDE 40 MG PO TABS
40.0000 mg | ORAL_TABLET | Freq: Every morning | ORAL | 0 refills | Status: AC
  Filled 2023-10-14 – 2023-10-15 (×2): qty 90, 90d supply, fill #0

## 2023-10-14 MED ORDER — EZETIMIBE 10 MG PO TABS
10.0000 mg | ORAL_TABLET | Freq: Every day | ORAL | 0 refills | Status: DC
  Filled 2023-10-14 – 2023-10-15 (×3): qty 90, 90d supply, fill #0

## 2023-10-14 MED ORDER — PANTOPRAZOLE SODIUM 40 MG PO TBEC
40.0000 mg | DELAYED_RELEASE_TABLET | Freq: Every day | ORAL | 0 refills | Status: AC
  Filled 2023-10-14 – 2023-10-15 (×3): qty 90, 90d supply, fill #0

## 2023-10-14 MED ORDER — ATORVASTATIN CALCIUM 80 MG PO TABS
80.0000 mg | ORAL_TABLET | Freq: Every day | ORAL | 0 refills | Status: AC
  Filled 2023-10-14 – 2023-10-15 (×2): qty 90, 90d supply, fill #0

## 2023-10-14 MED ORDER — EMPAGLIFLOZIN 10 MG PO TABS
10.0000 mg | ORAL_TABLET | Freq: Every day | ORAL | 0 refills | Status: DC
  Filled 2023-10-14 – 2023-10-15 (×3): qty 90, 90d supply, fill #0

## 2023-10-14 MED ORDER — ALLOPURINOL 100 MG PO TABS
100.0000 mg | ORAL_TABLET | Freq: Every day | ORAL | 0 refills | Status: DC
  Filled 2023-10-14 – 2023-10-15 (×2): qty 90, 90d supply, fill #0

## 2023-10-14 MED ORDER — FUROSEMIDE 40 MG PO TABS
40.0000 mg | ORAL_TABLET | Freq: Every day | ORAL | Status: DC
  Administered 2023-10-14 – 2023-10-15 (×2): 40 mg via ORAL
  Filled 2023-10-14: qty 1
  Filled 2023-10-14: qty 2

## 2023-10-14 MED ORDER — METOPROLOL SUCCINATE ER 25 MG PO TB24
25.0000 mg | ORAL_TABLET | Freq: Every day | ORAL | 0 refills | Status: DC
  Filled 2023-10-14: qty 90, 90d supply, fill #0

## 2023-10-14 MED ORDER — ESCITALOPRAM OXALATE 5 MG PO TABS
5.0000 mg | ORAL_TABLET | Freq: Every day | ORAL | 0 refills | Status: DC
  Filled 2023-10-14 – 2023-10-15 (×3): qty 90, 90d supply, fill #0

## 2023-10-14 NOTE — Plan of Care (Signed)
  Problem: Clinical Measurements: Goal: Will remain free from infection Outcome: Progressing   Problem: Coping: Goal: Level of anxiety will decrease Outcome: Not Progressing   Problem: Pain Managment: Goal: General experience of comfort will improve and/or be controlled Outcome: Not Progressing   Problem: Safety: Goal: Ability to remain free from injury will improve Outcome: Progressing   Problem: Skin Integrity: Goal: Risk for impaired skin integrity will decrease Outcome: Progressing

## 2023-10-14 NOTE — Progress Notes (Addendum)
 Per d/w Dr. Shlomo consolidated f/u to 10/6 - can get both OV + BMET at that time. Msg sent to [facility] to help arrange f/u with Dr. Santo for HCM eval.

## 2023-10-14 NOTE — Progress Notes (Signed)
 This RN attempted to pick up meds for patient's discharge status  of 10/15/23. Per pharmacy, it is too soon to refill discharge meds - they will be shipped to patient on Monday, patient will have them by Tuesday. Primary nurse and patient updated on medications being shipped on 9/29. Pt verbalized an understanding, she has medications at home that will last until the other meds arrive.

## 2023-10-14 NOTE — Evaluation (Signed)
 Physical Therapy Evaluation Patient Details Name: Christine Jacobson MRN: [id number] DOB: [birth year] Today's Date: 10/14/2023  History of Present Illness  68 yo female admitted with heart failure, chest pain, persistent Afib. Hx of CHF, Afib, HOCM, pulm HTN, anemia, GI bleed  Clinical Impression  On eval, pt was Supv level for mobility. She walked ~75 feet with a RW. She tolerated activity fairly well. She reported fatigue during session. Pt presents with general weakness, decreased activity tolerance, and impaired gait/balance. Will plan to follow pt during this hospital stay. Recommend HHPT f/u.         If plan is discharge home, recommend the following: A little help with walking and/or transfers;A little help with bathing/dressing/bathroom;Assistance with cooking/housework;Assist for transportation;Help with stairs or ramp for entrance   Can travel by private vehicle        Equipment Recommendations None recommended by PT  Recommendations for Other Services       Functional Status Assessment Patient has had a recent decline in their functional status and demonstrates the ability to make significant improvements in function in a reasonable and predictable amount of time.     Precautions / Restrictions Precautions Precautions: Fall Restrictions Weight Bearing Restrictions Per Provider Order: No      Mobility  Bed Mobility               General bed mobility comments: pt sitting EOB    Transfers Overall transfer level: Needs assistance Equipment used: Rolling walker (2 wheels) Transfers: Sit to/from Stand Sit to Stand: Supervision           General transfer comment: increased time.    Ambulation/Gait Ambulation/Gait assistance: Supervision Gait Distance (Feet): 75 Feet Assistive device: Rolling walker (2 wheels) Gait Pattern/deviations: Step-through pattern, Decreased stride length       General Gait Details: Decreased gait speed. No LOB with RW. 2  brief standing rest breaks. O2 95% on RA, HR 115 bpm, dyspnea 2/4  Stairs            Wheelchair Mobility     Tilt Bed    Modified Rankin (Stroke Patients Only)       Balance Overall balance assessment: Needs assistance         Standing balance support: During functional activity, Reliant on assistive device for balance, Bilateral upper extremity supported Standing balance-Leahy Scale: Fair                               Pertinent Vitals/Pain Pain Assessment Pain Assessment: No/denies pain    Home Living Family/patient expects to be discharged to:: Private residence Living Arrangements: Alone Available Help at Discharge: Friend(s) Type of Home: Apartment Home Access: Stairs to enter Entrance Stairs-Rails: Left Entrance Stairs-Number of Steps: 6   Home Layout: One level Home Equipment: Agricultural consultant (2 wheels)      Prior Function Prior Level of Function : Independent/Modified Independent             Mobility Comments: walks with RW; doesn't drive, friends vs cab services provide transportation ADLs Comments: grocery shops through [employer]; bird bathes at baseline, independent with ADLs and iADLs, kitchen and laundry are communal     Extremity/Trunk Assessment   Upper Extremity Assessment Upper Extremity Assessment: Generalized weakness    Lower Extremity Assessment Lower Extremity Assessment: Generalized weakness    Cervical / Trunk Assessment Cervical / Trunk Assessment: Normal  Communication   Communication Communication: No apparent  difficulties    Cognition Arousal: Alert Behavior During Therapy: WFL for tasks assessed/performed                           PT - Cognition Comments: mildly irritable-intermittently not responding to questions asked of her Following commands: Intact       Cueing       General Comments      Exercises     Assessment/Plan    PT Assessment Patient needs  continued PT services  PT Problem List Decreased strength;Decreased activity tolerance;Decreased balance;Decreased mobility       PT Treatment Interventions DME instruction;Gait training;Functional mobility training;Therapeutic activities;Therapeutic exercise;Patient/family education;Balance training    PT Goals (Current goals can be found in the Care Plan section)  Acute Rehab PT Goals Patient Stated Goal: none stated PT Goal Formulation: With patient Time For Goal Achievement: 10/28/23 Potential to Achieve Goals: Good    Frequency Min 3X/week     Co-evaluation               AM-PAC PT 6 Clicks Mobility  Outcome Measure Help needed turning from your back to your side while in a flat bed without using bedrails?: A Little Help needed moving from lying on your back to sitting on the side of a flat bed without using bedrails?: A Little Help needed moving to and from a bed to a chair (including a wheelchair)?: A Little Help needed standing up from a chair using your arms (e.g., wheelchair or bedside chair)?: A Little Help needed to walk in hospital room?: A Little Help needed climbing 3-5 steps with a railing? : A Little 6 Click Score: 18    End of Session Equipment Utilized During Treatment: Gait belt Activity Tolerance: Patient tolerated treatment well;Patient limited by fatigue Patient left: in bed;with call bell/phone within reach (sitting EOB)   PT Visit Diagnosis: Muscle weakness (generalized) (M62.81);Difficulty in walking, not elsewhere classified (R26.2)    Time: 8893-8883 PT Time Calculation (min) (ACUTE ONLY): 10 min   Charges:   PT Evaluation $PT Eval Low Complexity: 1 Low   PT General Charges $$ ACUTE PT VISIT: 1 Visit           Dannial SQUIBB, PT Acute Rehabilitation  Office: [phone]

## 2023-10-14 NOTE — Progress Notes (Signed)
 PROGRESS NOTE    Christine Jacobson  FMW:[id number] DOB: 08/26/1955 DOA: 10/11/2023 PCP: [name], NP   Chief Complaint  Patient presents with   Shortness of Breath    Brief Narrative:  Patient 67 year old female history of hypertension, hyperlipidemia, obesity, HOCM, HFpEF admitted to the hospital with a 2-day history of shortness of breath, lower extremity edema, palpitations, concern for acute on chronic heart failure with preserved EF.  Patient also with some complaints of some intermittent chest pain.   Assessment & Plan:   Principal Problem:   Acute diastolic (congestive) heart failure (HCC) Active Problems:   Mild hyperlipidemia   Chest pain of uncertain etiology   Hypertension   HOCM (hypertrophic obstructive cardiomyopathy) (HCC)   Pulmonary HTN (HCC)   Acute exacerbation of CHF (congestive heart failure) (HCC)   MDD (major depressive disorder)   Acute on chronic diastolic CHF (congestive heart failure) (HCC)   Elevated troponin   Abnormal stress test   Coronary artery calcification seen on CAT scan  #1 acute on chronic diastolic CHF/HOCM/pulmonary hypertension -Patient presented with shortness of breath x 2 days, lower extremity edema, palpitations, some intermittent chest pain. -Patient still with shortness of breath. - BNP noted to be elevated on admission. - Chest x-ray concerning for vascular congestion. - 2D echo pending. - Patient currently on Lasix  40 mg IV every 12 hours with urine output not accurately recorded over the past 24 hours.  - Patient with clinical improvement.  - Patient seen in consultation by cardiology who noted a degree of noncompliance with medical therapy as well as noncompliance with multiple cardiac follow-ups. - Patient being followed by cardiology and is being transition to oral Lasix  home dose 40 mg daily today.  - Strict I's and O's, daily weights.  - Patient noted to have been referred to Dr.Chandrasekhar for HOCM  clinic but never followed up through and canceled appointments. - Cardiology recommended gentle diuresis to prevent an acute drop in preload resulting worsening of LVOT gradient. -Patient underwent cardiac catheterization, 10/13/2023 which showed normal coronaries, normal filling pressures, minimal gradient from the body of the LV to the aorta. - Continue Toprol -XL 25 mg daily. - Cardiology recommending addition of Jardiance  10 mg daily after cardiac catheterization which has been started. - IV Lasix  being transitioned to oral Lasix . - Will need outpatient follow-up with cardiology. - Per cardiology.  2.  Chest pain/elevated troponin/history of abnormal stress Myoview -Patient noted to have told cardiology that she had been having some intermittent chest pain for some time. - Patient noted by cardiology to have a Myoview done in 2023 read as high risk, no ischemia, normal LV function - High-sensitivity troponin mildly elevated but flattened. - Per cardiology patient with some intermittent complaints of chest pain which could be related to HOCM or CHF, also concern for ischemia.  - Cardiology recommended right and left heart catheterization to define coronary anatomy and assess filling pressures which was done today 10/13/2023 which showed normal coronaries, normal filling pressures, minimal gradient from the body of the LV to the aorta.   - Per cardiology.    3.  Persistent A-fib -Patient with a history of A-fib and decision made for rate control given his severe left atrial enlargement. - Cha2ds2vasc SCORE 4 - Rate controlled.  - Continue home regimen Toprol -XL 25 mg daily resumed per cardiology recommendations. - Patient was on Eliquis  5 mg twice daily and Eliquis  held in anticipation of cardiac catheterization and patient placed on IV heparin  per  pharmacy.  - Eliquis  resumed post cath.  4.  Hyperlipidemia -LDL of 118, HDL of 52. - Continue Lipitor 80 mg daily, Zetia  10 mg daily.   5.   GERD - PPI.    6.  Chronic normocytic anemia -Stable.    DVT prophylaxis: Eliquis >>> heparin  Code Status: Full Family Communication: Updated patient and son at bedside.  Disposition: Likely home when clinically improved and cleared by cardiology.  Status is: Inpatient Remains inpatient appropriate because: Severity of illness.   Consultants:  Cardiology: Dr. Shlomo 10/12/2023  Procedures:  Chest x-ray 10/11/2023 2D echo pending Cardiac catheterization 10/13/2023 Dr. Rolan  Antimicrobials:  Anti-infectives (From admission, onward)    None         Subjective: Patient sitting up on the side of the bed.  Denies any chest pain.  Feels shortness of breath has improved significantly.  No abdominal pain.    Objective: Vitals:   10/13/23 1515 10/13/23 1645 10/13/23 2129 10/14/23 0347  BP: (!) 111/92 131/77 106/67 129/81  Pulse: 85 74 84 77  Resp: 16  19 18   Temp:  (!) 97.5 F (36.4 C) 97.8 F (36.6 C) 97.8 F (36.6 C)  TempSrc:  Oral Oral Oral  SpO2: 96% 99% 95% 96%  Weight:      Height:        Intake/Output Summary (Last 24 hours) at 10/14/2023 1115 Last data filed at 10/14/2023 0900 Gross per 24 hour  Intake 489.55 ml  Output --  Net 489.55 ml   Filed Weights   10/11/23 1640 10/12/23 0500 10/13/23 0500  Weight: 84.6 kg 82.7 kg 80.6 kg    Examination:  General exam: NAD Respiratory system: CTAB.  No wheezes, no crackles, no rhonchi.  Fair air movement.  Speaking in full sentences. Cardiovascular system: Irregularly irregular with 3/6 SEM.  No significant lower extremity edema.  Gastrointestinal system: Abdomen is soft, nontender, nondistended, positive bowel sounds.  No rebound.  No guarding.  Central nervous system: Alert and oriented. No focal neurological deficits. Extremities: Symmetric 5 x 5 power. Skin: No rashes, lesions or ulcers Psychiatry: Judgement and insight appear normal. Mood & affect appropriate.     Data Reviewed: I have personally  reviewed following labs and imaging studies  CBC: Recent Labs  Lab 10/11/23 0935 10/12/23 0458 10/13/23 0431 10/13/23 1117 10/14/23 0514  WBC 5.9 7.3 7.5  --  7.7  NEUTROABS 4.3  --   --   --   --   HGB 11.1* 10.8* 11.1* 12.2  11.9* 11.7*  HCT 36.1 34.3* 35.6* 36.0  35.0* 36.8  MCV 95.0 94.5 96.2  --  94.8  PLT 195 196 210  --  220    Basic Metabolic Panel: Recent Labs  Lab 10/11/23 0935 10/12/23 0458 10/13/23 0431 10/13/23 1117 10/14/23 0514  NA 141 141 139 141  140 140  K 4.8 4.4 4.1 4.0  4.0 4.2  CL 109 107 106  --  102  CO2 20* 20* 21*  --  23  GLUCOSE 103* 100* 99  --  93  BUN 24* 27* 33*  --  31*  CREATININE 1.01* 1.21* 1.23*  --  1.16*  CALCIUM  9.9 9.7 9.8  --  9.9  MG  --   --  2.3  --  2.2    GFR: Estimated Creatinine Clearance: 44.4 mL/min (A) (by C-G formula based on SCr of 1.16 mg/dL (H)).  Liver Function Tests: Recent Labs  Lab 10/11/23 0935  AST 46*  ALT  29  ALKPHOS 189*  BILITOT 1.0  PROT 6.3*  ALBUMIN  3.8    CBG: No results for input(s): GLUCAP in the last 168 hours.   No results found for this or any previous visit (from the past 240 hours).       Radiology Studies: CARDIAC CATHETERIZATION Result Date: 10/13/2023 1. Normal coronaries. 2. Normal filling pressures. 3. Minimal gradient from the body of the LV to the aorta. Ongoing medical management.        Scheduled Meds:  apixaban   5 mg Oral BID   atorvastatin   80 mg Oral Daily   empagliflozin   10 mg Oral Daily   escitalopram   5 mg Oral QHS   ezetimibe   10 mg Oral Daily   free water   500 mL Oral Once   furosemide   40 mg Oral Daily   metoprolol  succinate  25 mg Oral Daily   pantoprazole   40 mg Oral q morning   potassium chloride   40 mEq Oral Daily   sodium chloride  flush  3 mL Intravenous Q12H   Continuous Infusions:  sodium chloride        LOS: 3 days    Time spent: 40 minutes    Toribio Hummer, MD [facility]   To contact the attending  provider between 7A-7P or the covering provider during after hours 7P-7A, please log into the web site [email or web address] and access using universal [place] password for that web site. If you do not have the password, please call the hospital operator.  10/14/2023, 11:15 AM

## 2023-10-14 NOTE — Progress Notes (Addendum)
 Progress Note  Patient Name: Christine Jacobson Date of Encounter: 10/14/2023  [facility] Cardiologist: Kardie Tobb, DO   Patient Profile 68 y.o. female with a history of HOCM, chronic diastolic CHF, moderate pulmonary hypertension, persistent atrial fibrillation on chronic anticoagulation with Eliquis , hypertension, hyperlipidemia, GERD with history of GI bleed and chronic anemia.     Subjective   Cath yesterday with normal coronary arteries and well compensation CHF >>normal filling pressures  Denies any CP or SOB this am   Inpatient Medications    Scheduled Meds:  apixaban   5 mg Oral BID   aspirin  EC  81 mg Oral Daily   atorvastatin   80 mg Oral Daily   empagliflozin   10 mg Oral Daily   escitalopram   5 mg Oral QHS   ezetimibe   10 mg Oral Daily   free water   500 mL Oral Once   furosemide   40 mg Intravenous BID   metoprolol  succinate  25 mg Oral Daily   pantoprazole   40 mg Oral q morning   potassium chloride   40 mEq Oral Daily   sodium chloride  flush  3 mL Intravenous Q12H   Continuous Infusions:  sodium chloride      PRN Meds: sodium chloride , acetaminophen  **OR** acetaminophen , acetaminophen , albuterol , ondansetron  **OR** ondansetron  (ZOFRAN ) IV, ondansetron  (ZOFRAN ) IV, sodium chloride  flush, traZODone    Vital Signs    Vitals:   10/13/23 1515 10/13/23 1645 10/13/23 2129 10/14/23 0347  BP: (!) 111/92 131/77 106/67 129/81  Pulse: 85 74 84 77  Resp: 16  19 18   Temp:  (!) 97.5 F (36.4 C) 97.8 F (36.6 C) 97.8 F (36.6 C)  TempSrc:  Oral Oral Oral  SpO2: 96% 99% 95% 96%  Weight:      Height:        Intake/Output Summary (Last 24 hours) at 10/14/2023 9182 Last data filed at 10/13/2023 1600 Gross per 24 hour  Intake 331.49 ml  Output --  Net 331.49 ml      10/13/2023    5:00 AM 10/12/2023    5:00 AM 10/11/2023    4:40 PM  Last 3 Weights  Weight (lbs) 177 lb 11.1 oz 182 lb 5.1 oz 186 lb 6.4 oz  Weight (kg) 80.6 kg 82.7 kg 84.55 kg       Telemetry    Atrial Fibrillation - Personally Reviewed  ECG    No new EKG to review - Personally Reviewed  Physical Exam   GEN: Well nourished, well developed in no acute distress HEENT: Normal NECK: No JVD; No carotid bruits LYMPHATICS: No lymphadenopathy CARDIAC:irregularly irregular, no rubs, gallops 2/6 SM at RUSB to LLSB RESPIRATORY:  Clear to auscultation without rales, wheezing or rhonchi  ABDOMEN: Soft, non-tender, non-distended MUSCULOSKELETAL:  No edema; No deformity  SKIN: Warm and dry NEUROLOGIC:  Alert and oriented x 3 PSYCHIATRIC:  Normal affect  Labs    High Sensitivity Troponin:  No results for input(s): TROPONINIHS in the last 720 hours.    Chemistry Recent Labs  Lab 10/11/23 0935 10/12/23 0458 10/13/23 0431 10/13/23 1117 10/14/23 0514  NA 141 141 139 141  140 140  K 4.8 4.4 4.1 4.0  4.0 4.2  CL 109 107 106  --  102  CO2 20* 20* 21*  --  23  GLUCOSE 103* 100* 99  --  93  BUN 24* 27* 33*  --  31*  CREATININE 1.01* 1.21* 1.23*  --  1.16*  CALCIUM  9.9 9.7 9.8  --  9.9  PROT 6.3*  --   --   --   --   ALBUMIN  3.8  --   --   --   --   AST 46*  --   --   --   --   ALT 29  --   --   --   --   ALKPHOS 189*  --   --   --   --   BILITOT 1.0  --   --   --   --   GFRNONAA 60* 48* 47*  --  50*  ANIONGAP 12 15 13   --  15     Hematology Recent Labs  Lab 10/12/23 0458 10/13/23 0431 10/13/23 1117 10/14/23 0514  WBC 7.3 7.5  --  7.7  RBC 3.63* 3.70*  --  3.88  HGB 10.8* 11.1* 12.2  11.9* 11.7*  HCT 34.3* 35.6* 36.0  35.0* 36.8  MCV 94.5 96.2  --  94.8  MCH 29.8 30.0  --  30.2  MCHC 31.5 31.2  --  31.8  RDW 16.5* 16.7*  --  16.5*  PLT 196 210  --  220    BNP Recent Labs  Lab 10/11/23 0935 10/12/23 0458  PROBNP 4,647.0* 4,823.0*     DDimer No results for input(s): DDIMER in the last 168 hours.   CHA2DS2-VASc Score = 4   This indicates a 4.8% annual risk of stroke. The patient's score is based upon: CHF History: 1 HTN History:  1 Diabetes History: 0 Stroke History: 0 Vascular Disease History: 0 Age Score: 1 Gender Score: 1      Radiology    CARDIAC CATHETERIZATION Result Date: 10/13/2023 1. Normal coronaries. 2. Normal filling pressures. 3. Minimal gradient from the body of the LV to the aorta. Ongoing medical management.    Patient Profile     68 y.o. 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), LVOT rest gradient 36 mmHg, mild RV dysfunction - Admitted with several day history of increasing shortness of breath and lower extremity edema - BNP 4823; Chest x-ray mild vascular congestion - Mainly right-sided heart failure with significant lower extremity edema on exam - currently on Lasix  40mg  IV BID - I&O's not recorded - SCr 1 on admission>> 1.16 today with diuresis; K+ 4.2; Mag 2.2 - R and LHC yesterday with normal filling pressures and normal coronary arteries - transition back to PTA Lasix  40mg  daily PO - continue Toprol  XL 25mg  daily  - start Jardiance  10mg  daily - Patient was referred to Dr. 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 -High-sensitivity troponin mildly elevated with flat trend at 35 and 34 which is likely demand ischemia in the setting of CHF but given her episodes of chest pain need to consider coronary ischemia - She continues to have episodes of intermittent chest pain which could be related to HOCM or CHF but also worrisome for ischemia which also could be contributing to her recurrent episodes of CHF - Chest CT 05/10/2023 showed coronary artery calcifications - LHC yesterday with normal coronary arteries - stop ASA - IV Heparin  gtt transitioned back to PTA Eliquis  5mg  BID - continue Atorvastatin  80mg  daily, Zetia  10mg  daily and Toprol  XL 25mg  daily  - Lipids: LDL 94, HDL 61, TAG 116 on 10/14/2023   Persistent atrial fibrillation - Has a history of atrial fibrillation and decision was made for rate control given her severe LAE - rate controlled on tele this am - CHADS2 Vasc score 4 - continue Eliquis  5mg  BID and Toprol  XL 25mg  daily    [facility] will sign off.   Medication Recommendations:  Apixaban  5mg  BID, Atorvastatin  80mg  daily, Jardiance  10mg  daily, Zetia  10mg  daily, Lasix  40mg  daily, Toprol  XL 25mg  daily.  Potassium dose to be determined at discharge by [facility] Other recommendations (labs, testing, etc):  BMET in 1 week Follow up as an outpatient:  Dr. Sheena in 2 weeks and consult with Dr. Santo for HOCM  I spent 35 minutes caring for this patient today face to face, ordering and reviewing labs, reviewing records from cardiac cath from 10/13/2023 , seeing the patient, documenting in the record, and arranging for a outpt labs and followup   For questions or updates, please contact [facility] Please consult  [email or web address] for contact info under        Signed, Wilbert Bihari, MD  10/14/2023, 8:17 AM

## 2023-10-15 ENCOUNTER — Encounter (HOSPITAL_COMMUNITY): Payer: Self-pay | Admitting: Cardiology

## 2023-10-15 ENCOUNTER — Other Ambulatory Visit (HOSPITAL_COMMUNITY): Payer: Self-pay

## 2023-10-15 DIAGNOSIS — I5033 Acute on chronic diastolic (congestive) heart failure: Secondary | ICD-10-CM | POA: Diagnosis not present

## 2023-10-15 DIAGNOSIS — I421 Obstructive hypertrophic cardiomyopathy: Secondary | ICD-10-CM | POA: Diagnosis not present

## 2023-10-15 DIAGNOSIS — F329 Major depressive disorder, single episode, unspecified: Secondary | ICD-10-CM | POA: Diagnosis not present

## 2023-10-15 DIAGNOSIS — R7989 Other specified abnormal findings of blood chemistry: Secondary | ICD-10-CM | POA: Diagnosis not present

## 2023-10-15 LAB — CBC
HCT: 38.5 % (ref 36.0–46.0)
Hemoglobin: 12.1 g/dL (ref 12.0–15.0)
MCH: 29.4 pg (ref 26.0–34.0)
MCHC: 31.4 g/dL (ref 30.0–36.0)
MCV: 93.4 fL (ref 80.0–100.0)
Platelets: 219 K/uL (ref 150–400)
RBC: 4.12 MIL/uL (ref 3.87–5.11)
RDW: 16 % — ABNORMAL HIGH (ref 11.5–15.5)
WBC: 6.1 K/uL (ref 4.0–10.5)
nRBC: 0 % (ref 0.0–0.2)

## 2023-10-15 LAB — BASIC METABOLIC PANEL WITH GFR
Anion gap: 13 (ref 5–15)
BUN: 34 mg/dL — ABNORMAL HIGH (ref 8–23)
CO2: 22 mmol/L (ref 22–32)
Calcium: 10.3 mg/dL (ref 8.9–10.3)
Chloride: 101 mmol/L (ref 98–111)
Creatinine, Ser: 1.27 mg/dL — ABNORMAL HIGH (ref 0.44–1.00)
GFR, Estimated: 45 mL/min — ABNORMAL LOW (ref >60)
Glucose, Bld: 105 mg/dL — ABNORMAL HIGH (ref 70–99)
Potassium: 4 mmol/L (ref 3.5–5.1)
Sodium: 135 mmol/L (ref 135–145)

## 2023-10-15 LAB — LIPOPROTEIN A (LPA): Lipoprotein (a): 68.5 nmol/L — ABNORMAL HIGH (ref <75.0)

## 2023-10-15 NOTE — Progress Notes (Signed)
 AVS teaching provided to patient. All questions and concerns addressed. PIV and telemetry removed. Waiting for TOC meds and then pt will call her cab. No other needs noted at this time. Will continue to monitor.

## 2023-10-15 NOTE — Discharge Summary (Signed)
 Physician Discharge Summary  Christine Jacobson FMW:[id number] DOB: 03/25/1955 DOA: 10/11/2023  PCP: [name], NP  Admit date: 10/11/2023 Discharge date: 10/15/2023  Time spent: 60 minutes  Recommendations for Outpatient Follow-up:  Follow-up with Damien Grad, NP cardiology on October 23, 2023 at 2:45 PM.  Cardiology office will also call to set up outpatient follow-up with Dr. Arnetha for patient's hypertrophic cardiomyopathy. Follow-up with [name], NP in 2 weeks.  On follow-up patient will need a basic metabolic profile, magnesium  level done to follow-up on electrolytes and renal function.  Patient need a CBC to follow-up on counts.   Discharge Diagnoses:  Principal Problem:   Acute diastolic (congestive) heart failure (HCC) Active Problems:   Mild hyperlipidemia   Chest pain of uncertain etiology   Hypertension   HOCM (hypertrophic obstructive cardiomyopathy) (HCC)   Pulmonary HTN (HCC)   Acute exacerbation of CHF (congestive heart failure) (HCC)   MDD (major depressive disorder)   Acute on chronic diastolic CHF (congestive heart failure) (HCC)   Elevated troponin   Abnormal stress test   Coronary artery calcification seen on CAT scan   Discharge Condition: Stable and improved.  Diet recommendation: Heart healthy  Filed Weights   10/12/23 0500 10/13/23 0500 10/15/23 0500  Weight: 82.7 kg 80.6 kg 79 kg    History of present illness:  HPI per Dr. Zella Lyndy Landry Shaver is a 68 y.o. female with medical history significant for hypertension, hyperlipidemia, obesity, heart failure with preserved EF being admitted to the hospital with 2 days of shortness of breath, lower extremity edema and concern for acute on chronic heart failure with preserved EF.  Patient denies any chest pain, cough, orthopnea, fevers, chills or other concerns.  States that she noted 2 to 3 days ago that her legs were starting to get puffy, and over the last 24 hours she  has had progressive shortness of breath with exertion.  Evaluation in the ER as detailed below shows evidence of acute heart failure, she was given a dose of IV Lasix  and admitted to the hospitalist service.   Hospital Course:  #1 acute on chronic diastolic CHF/HOCM/pulmonary hypertension -Patient presented with shortness of breath x 2 days prior to admission, lower extremity edema, palpitations, some intermittent chest pain. - BNP noted to be elevated on admission. - Chest x-ray concerning for vascular congestion. - Patient placed on Lasix  40 mg IV every 12 hours with good urine output and clinical improvement during the hospitalization.  - Patient seen in consultation by cardiology who noted a degree of noncompliance with medical therapy as well as noncompliance with multiple cardiac follow-ups. - Patient being followed by cardiology and as patient improved clinically and near euvolemia was transition to home dose oral Lasix  40 mg daily.  - Patient noted to have been referred to Dr.Chandrasekhar for HOCM clinic but never followed up through and canceled appointments. - Cardiology recommended gentle diuresis to prevent an acute drop in preload resulting worsening of LVOT gradient. -Patient underwent cardiac catheterization, 10/13/2023 which showed normal coronaries, normal filling pressures, minimal gradient from the body of the LV to the aorta. - Patient maintained on home regimen Toprol -XL 25 mg daily. - Cardiology recommended addition of Jardiance  10 mg daily after cardiac catheterization which was started per cardiology during the hospitalization. - IV Lasix  transitioned to oral Lasix . -Patient cleared by cardiology for discharge and be discharged in stable and improved condition with outpatient follow-up with cardiology.   2.  Chest pain/elevated troponin/history of  abnormal stress Myoview -Patient noted to have told cardiology that she had been having some intermittent chest pain for some  time. - Patient noted by cardiology to have a Myoview done in 2023 read as high risk, no ischemia, normal LV function - High-sensitivity troponin mildly elevated but flattened. - Per cardiology patient with some intermittent complaints of chest pain which could be related to HOCM or CHF, also concern for ischemia.  - Cardiology recommended right and left heart catheterization to define coronary anatomy and assess filling pressures which was done, 10/13/2023 which showed normal coronaries, normal filling pressures, minimal gradient from the body of the LV to the aorta.   - Patient was followed by cardiology throughout the hospitalization.  -Outpatient follow-up with cardiology.    3.  Persistent A-fib -Patient with a history of A-fib and decision made for rate control given his severe left atrial enlargement. - Cha2ds2vasc SCORE 4 - Rate controlled.  - Patient maintained on home regimen Toprol -XL 25 mg daily resumed per cardiology recommendations. - Patient was on Eliquis  5 mg twice daily and Eliquis  held in anticipation of cardiac catheterization and patient placed on IV heparin  per pharmacy.  - Eliquis  resumed post cath. -Outpatient follow-up with cardiology.   4.  Hyperlipidemia -LDL of 118, HDL of 52. - Patient maintained on Lipitor 80 mg daily, Zetia  10 mg daily.  -Outpatient follow-up with PCP.   5.  GERD - Patient maintained on PPI.     6.  Chronic normocytic anemia -Stable.    Procedures: Chest x-ray 10/11/2023 2D echo pending Cardiac catheterization 10/13/2023 Dr. Rolan  Consultations: Cardiology: Dr. Shlomo 10/12/2023   Discharge Exam: Vitals:   10/15/23 0629 10/15/23 0948  BP: 134/82 131/75  Pulse: 75 78  Resp: 16 16  Temp: 97.9 F (36.6 C)   SpO2: 96% 95%    General: NAD Cardiovascular: Irregularly irregular.  No murmurs rubs or gallops.  No JVD.  No pitting lower extremity edema. Respiratory: Clear to auscultation bilaterally.  No wheezes, no crackles, no  rhonchi.  Fair air movement.  Speaking in full sentences.  Discharge Instructions   Discharge Instructions     Diet - low sodium heart healthy   Complete by: As directed    Increase activity slowly   Complete by: As directed       Allergies as of 10/15/2023       Reactions   Pork-derived Products Other (See Comments)   Religious preference        Medication List     STOP taking these medications    aspirin  EC 81 MG tablet   predniSONE  10 MG tablet Commonly known as: DELTASONE        TAKE these medications    acetaminophen  325 MG tablet Commonly known as: TYLENOL  Take 2 tablets (650 mg total) by mouth every 6 (six) hours as needed for mild pain (pain score 1-3) or fever (or Fever >/= 101).   allopurinol  100 MG tablet Commonly known as: ZYLOPRIM  Take 1 tablet (100 mg total) by mouth daily. What changed: Another medication with the same name was removed. Continue taking this medication, and follow the directions you see here.   apixaban  5 MG Tabs tablet Commonly known as: Eliquis  Take 1 tablet (5 mg total) by mouth 2 (two) times daily.   ascorbic acid  500 MG tablet Commonly known as: VITAMIN C  Take 1 tablet (500 mg total) by mouth daily for 30 days   atorvastatin  80 MG tablet Commonly known as: LIPITOR Take  1 tablet (80 mg total) by mouth daily.   empagliflozin  10 MG Tabs tablet Commonly known as: JARDIANCE  Take 1 tablet (10 mg total) by mouth daily.   escitalopram  5 MG tablet Commonly known as: LEXAPRO  Take 1 tablet (5 mg total) by mouth daily.   ezetimibe  10 MG tablet Commonly known as: ZETIA  Take 1 tablet (10 mg total) by mouth daily.   furosemide  40 MG tablet Commonly known as: Lasix  Take 1 tablet (40 mg total) by mouth in the morning.   metoprolol  succinate 25 MG 24 hr tablet Commonly known as: TOPROL -XL Take 1 tablet (25 mg total) by mouth daily.   pantoprazole  40 MG tablet Commonly known as: PROTONIX  Take 1 tablet (40 mg total) by  mouth daily. What changed: Another medication with the same name was removed. Continue taking this medication, and follow the directions you see here.   Potassium Chloride  ER 20 MEQ Tbcr Take 1 tablet (20 mEq total) by mouth every other day. What changed: when to take this   Zinc  Sulfate 220 (50 Zn) MG Tabs Take 1 tablet (220 mg total) by mouth daily for 30 days       Allergies  Allergen Reactions   Pork-Derived Products Other (See Comments)    Religious preference    Follow-up Information     Monge, Damien BROCKS, NP Follow up.   Specialties: Cardiology, Family Medicine Why: [facility] - [address] location - cardiology follow-up arranged on Monday Oct 23, 2023 at 2:45 PM (Arrive by 2:25 PM). Damien is one of the nurse practitioners that works with Dr. Sheena.  The cardiology office will also call you to arrange a follow-up visit with Dr. Santo to discuss hypertrophic cardiomyopathy. Contact information: [address] [place] [address] [phone]         [name], NP. Schedule an appointment as soon as possible for a visit in 2 week(s).   Specialty: Nurse Practitioner Contact information: [address] [place] [address] [phone]                  The results of significant diagnostics from this hospitalization (including imaging, microbiology, ancillary and laboratory) are listed below for reference.    Significant Diagnostic Studies: ECHOCARDIOGRAM COMPLETE Result Date: 10/14/2023    ECHOCARDIOGRAM REPORT   Patient Name:   Christine Jacobson Date of Exam: 10/14/2023 Medical Rec #:  [id number]          Height:       62.0 in Accession #:    [id number]         Weight:       177.7 lb Date of Birth:  1955-09-09          BSA:          1.818 m Patient Age:    68 years           BP:           129/81 mmHg Patient Gender: F                  HR:           78 bpm. Exam Location:  Inpatient Procedure: 2D Echo, Color Doppler and Cardiac  Doppler (Both Spectral and Color            Flow Doppler were utilized during procedure). Indications:    I50.31 Acute diastolic (congestive) heart failure  History:        Patient has prior history of  Echocardiogram examinations, most                 recent 05/12/2023. Hypertrophic Cardiomyopathy, Pulmonary HTN,                 Arrythmias:Atrial Fibrillation; Risk Factors:Hypertension and                 Dyslipidemia.  Sonographer:    Damien Senior RDCS Referring Phys: [id number] [address] Wil[address] V Dameir Gentzler IMPRESSIONS  1. Severe LVH more prominent in the proximal septum; chordal SAM; LVOT velocity at rest 3.6 m/s and with valsalva 4.2 m/s; findings c/w HOCM.  2. Left ventricular ejection fraction, by estimation, is >75%. The left ventricle has hyperdynamic function. The left ventricle has no regional wall motion abnormalities. There is severe left ventricular hypertrophy. Left ventricular diastolic function could not be evaluated.  3. Right ventricular systolic function is normal. The right ventricular size is normal. There is normal pulmonary artery systolic pressure. The estimated right ventricular systolic pressure is 26.0 mmHg.  4. Left atrial size was severely dilated.  5. Right atrial size was mildly dilated.  6. The mitral valve is normal in structure. Mild mitral valve regurgitation. No evidence of mitral stenosis. Moderate mitral annular calcification.  7. The aortic valve is tricuspid. Aortic valve regurgitation is not visualized. Aortic valve sclerosis is present, with no evidence of aortic valve stenosis.  8. The inferior vena cava is normal in size with greater than 50% respiratory variability, suggesting right atrial pressure of 3 mmHg. FINDINGS  Left Ventricle: Left ventricular ejection fraction, by estimation, is >75%. The left ventricle has hyperdynamic function. The left ventricle has no regional wall motion abnormalities. The left ventricular internal cavity size was normal in size. There is severe left  ventricular hypertrophy. Left ventricular diastolic function could not be evaluated due to atrial fibrillation. Left ventricular diastolic function could not be evaluated. Right Ventricle: The right ventricular size is normal. Right ventricular systolic function is normal. There is normal pulmonary artery systolic pressure. The tricuspid regurgitant velocity is 2.40 m/s, and with an assumed right atrial pressure of 3 mmHg,  the estimated right ventricular systolic pressure is 26.0 mmHg. Left Atrium: Left atrial size was severely dilated. Right Atrium: Right atrial size was mildly dilated. Pericardium: There is no evidence of pericardial effusion. Mitral Valve: The mitral valve is normal in structure. Moderate mitral annular calcification. Mild mitral valve regurgitation. No evidence of mitral valve stenosis. MV peak gradient, 8.8 mmHg. The mean mitral valve gradient is 3.0 mmHg. Tricuspid Valve: The tricuspid valve is normal in structure. Tricuspid valve regurgitation is mild . No evidence of tricuspid stenosis. Aortic Valve: The aortic valve is tricuspid. Aortic valve regurgitation is not visualized. Aortic valve sclerosis is present, with no evidence of aortic valve stenosis. Pulmonic Valve: The pulmonic valve was normal in structure. Pulmonic valve regurgitation is not visualized. No evidence of pulmonic stenosis. Aorta: The aortic root is normal in size and structure. Venous: The inferior vena cava is normal in size with greater than 50% respiratory variability, suggesting right atrial pressure of 3 mmHg. IAS/Shunts: No atrial level shunt detected by color flow Doppler. Additional Comments: Severe LVH more prominent in the proximal septum; chordal SAM; LVOT velocity at rest 3.6 m/s and with valsalva 4.2 m/s; findings c/w HOCM.  LEFT VENTRICLE PLAX 2D LVIDd:         3.80 cm   Diastology LVIDs:         2.30 cm   LV e' medial:  2.83 cm/s LV PW:         1.40 cm   LV E/e' medial:  43.5 LV IVS:        2.30 cm   LV  e' lateral:   7.83 cm/s LVOT diam:     2.00 cm   LV E/e' lateral: 15.7 LVOT Area:     3.14 cm LV IVRT:       114 msec  RIGHT VENTRICLE RV S prime:     7.72 cm/s TAPSE (M-mode): 1.8 cm LEFT ATRIUM              Index         RIGHT ATRIUM           Index LA diam:        5.70 cm  3.14 cm/m    RA Area:     23.20 cm LA Vol (A2C):   214.0 ml 117.71 ml/m  RA Volume:   68.30 ml  37.57 ml/m LA Vol (A4C):   236.0 ml 129.82 ml/m LA Biplane Vol: 235.0 ml 129.26 ml/m   AORTA Ao Root diam: 3.10 cm Ao Asc diam:  3.70 cm MITRAL VALVE                TRICUSPID VALVE MV Area (PHT): 2.61 cm     TR Peak grad:   23.0 mmHg MV Peak grad:  8.8 mmHg     TR Vmax:        240.00 cm/s MV Mean grad:  3.0 mmHg MV Vmax:       1.48 m/s     SHUNTS MV Vmean:      80.4 cm/s    Systemic Diam: 2.00 cm MV Decel Time: 291 msec MR Peak grad: 109.0 mmHg MR Vmax:      522.00 cm/s MV E velocity: 123.00 cm/s Redell Shallow MD Electronically signed by Redell Shallow MD Signature Date/Time: 10/14/2023/11:52:56 AM    Final    CARDIAC CATHETERIZATION Result Date: 10/13/2023 1. Normal coronaries. 2. Normal filling pressures. 3. Minimal gradient from the body of the LV to the aorta. Ongoing medical management.   DG Chest Portable 1 View Result Date: 10/11/2023 CLINICAL DATA:  Shortness of breath. Bilateral lower extremity edema. Some intermittent chest pressure. EXAM: PORTABLE CHEST 1 VIEW COMPARISON:  05/10/2023 FINDINGS: Lungs are adequately inflated without lobar consolidation or effusion. There is subtle hazy prominence of the central vasculature suggesting mild vascular congestion. Cardiomediastinal silhouette and remainder of the exam is unchanged. IMPRESSION: Suggestion of mild vascular congestion. Electronically Signed   By: Toribio Agreste M.D.   On: 10/11/2023 10:18    Microbiology: No results found for this or any previous visit (from the past 240 hours).   Labs: Basic Metabolic Panel: Recent Labs  Lab 10/11/23 0935 10/12/23 0458  10/13/23 0431 10/13/23 1117 10/14/23 0514 10/15/23 0504  NA 141 141 139 141  140 140 135  K 4.8 4.4 4.1 4.0  4.0 4.2 4.0  CL 109 107 106  --  102 101  CO2 20* 20* 21*  --  23 22  GLUCOSE 103* 100* 99  --  93 105*  BUN 24* 27* 33*  --  31* 34*  CREATININE 1.01* 1.21* 1.23*  --  1.16* 1.27*  CALCIUM  9.9 9.7 9.8  --  9.9 10.3  MG  --   --  2.3  --  2.2  --    Liver Function Tests: Recent Labs  Lab 10/11/23 0935  AST 46*  ALT 29  ALKPHOS 189*  BILITOT 1.0  PROT 6.3*  ALBUMIN  3.8   No results for input(s): LIPASE, AMYLASE in the last 168 hours. No results for input(s): AMMONIA in the last 168 hours. CBC: Recent Labs  Lab 10/11/23 0935 10/12/23 0458 10/13/23 0431 10/13/23 1117 10/14/23 0514 10/15/23 0504  WBC 5.9 7.3 7.5  --  7.7 6.1  NEUTROABS 4.3  --   --   --   --   --   HGB 11.1* 10.8* 11.1* 12.2  11.9* 11.7* 12.1  HCT 36.1 34.3* 35.6* 36.0  35.0* 36.8 38.5  MCV 95.0 94.5 96.2  --  94.8 93.4  PLT 195 196 210  --  220 219   Cardiac Enzymes: No results for input(s): CKTOTAL, CKMB, CKMBINDEX, TROPONINI in the last 168 hours. BNP: BNP (last 3 results) Recent Labs    02/14/23 1203 05/10/23 1645 05/11/23 0306  BNP 533.1* 499.6* 551.0*    ProBNP (last 3 results) Recent Labs    10/11/23 0935 10/12/23 0458  PROBNP 4,647.0* 4,823.0*    CBG: No results for input(s): GLUCAP in the last 168 hours.     Signed:  Toribio Hummer MD.  [facility] 10/15/2023, 9:48 AM

## 2023-10-15 NOTE — Plan of Care (Signed)
   Problem: Education: Goal: Knowledge of General Education information will improve Description: Including pain rating scale, medication(s)/side effects and non-pharmacologic comfort measures Outcome: Progressing   Problem: Safety: Goal: Ability to remain free from injury will improve Outcome: Progressing

## 2023-10-16 ENCOUNTER — Other Ambulatory Visit: Payer: Self-pay

## 2023-10-16 DIAGNOSIS — I421 Obstructive hypertrophic cardiomyopathy: Secondary | ICD-10-CM

## 2023-10-16 MED FILL — Heparin Sodium (Porcine) Inj 1000 Unit/ML: INTRAMUSCULAR | Qty: 10 | Status: AC

## 2023-10-16 NOTE — Progress Notes (Signed)
 Placed an order for HOCM clinic referral per message from provider post hospital visit on 10/11/23.

## 2023-10-22 NOTE — Progress Notes (Deleted)
 Cardiology Office Note:    Date:  10/22/2023   ID:  Christine Jacobson, DOB October 14, 1955, MRN [id number]  PCP:  [name], NP   [facility] Providers Cardiologist:  Dub Huntsman, DO { Click to update primary MD,subspecialty MD or APP then REFRESH:1}    Referring MD: [name], NP   Chief complaint: Hospital follow-up  History of Present Illness:    Christine Jacobson is a 68 y.o. female with a hx of HOCM, chronic diastolic CHF, moderate pulmonary hypertension, persistent atrial fibrillation on chronic anticoagulation with Eliquis , hypertension, hyperlipidemia, GERD with history of GI bleed and chronic anemia presenting to the office today for follow-up regarding recent hospitalization for CHF.  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  Lexiscan  Myoview showed no ischemia, EKG positive for changes in inferior  leads.  Following discussion with PCP, medical therapy was pursued, no cardiac cath performed at that time.  Admitted 04/2023 with CHF exacerbation, improved with IV diuresis.  Was then lost to follow-up.  Echo at that time was consistent with hokum and severe concentric LVH with basal septum 2.3 cm and LVOT gradient 36 mmHg.  EF 60-65%, mild RV function.  Patient had multiple cancellations for office visits with cardiology including an office visit with Dr. Santo for hokum.  Presented to ED on 10/11/2023 complaining of DOE, intermittent chest pain and increased lower extremity edema.  CXR demonstrating mild vascular congestion.  proBNP 4647 >>> 4823. HS troponin 35 and 34.  EKG showed atrial fibrillation with controlled ventricular response.  She received 2 doses of 40 mg IV Lasix  and put out 2.5 6L in the past 24 hours, resulting in a 4 pound weight loss.  R/LHC on 10/13/2023 showed normal coronary arteries, normal filling pressures, minimal AV gradient, ongoing medical management was  recommended.  Echo 10/14/2023 showed severe LVH more prominent in the proximal septum, chordal SAM, LVOT velocity at rest 3.6 ms with Valsalva 4.2 ms, findings consistent with hokum.  LVEF >75%, LV with hyperdynamic function, no RWMA, severe LVH.  Normal RV function, normal PASP, RVSP 26 mmHg.  LA severely dilated RA mildly dilated.  Mild MV regurg, moderate mitral annular calcification.  AV valve sclerosis present, no evidence of AV stenosis.  RA pressure 3 mmHg.  ***Check cath sites Question chest pain  Chronic diastolic CHF HOCM PHTN Historical chronic diastolic CHF with multiple CHF exacerbations, last being April 2025. Has documented noncompliance of medical therapy as well as noncompliance with cardiology follow-up visits Echo 10/14/2023: LVEF> 75%, hyperdynamic function, severe LVH, findings consistent with HOCM. Cardiac cath 10/13/2023: Normal coronary arteries, normal filling pressures Inpatient consult recommended gentle diuresis to prevent acute drop in preload resulting in worsening of LVOT gradient Symptoms Euvolemic Weight NYHA Class GDMT: Continue Toprol  XL 25 mg daily Continue Jardiance  10 mg daily  Persistent atrial fibrillation History of atrial fibrillation and decision was made for rate control given her severe LAE Remains in A-fib rate controlled CHA2DS2-VASc = 4 Continue Eliquis  5 mg twice daily Continue Toprol -XL 25 mg daily  Chronic normocytic anemia ***Recheck CBC   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   2D ECHO - EF >55%, normal   HTN (hypertension)    Hyperprolactinemia  dx in her 89, took meds, self d/c a while back   Liver function test abnormality    normal when repeated   Mild hyperlipidemia    Obesity    Palpitations     negative stress echo in November 2012 with normal LV function; mild LVH, proximal septal thickening with narrow LVOT and mild gradient; mild MR and TR; Cardionet showed PACs in November 2012   Pyoderma gangrenosa    Shortness of breath 07/11/2011   MET TEST    Past Surgical History:  Procedure Laterality Date   BIOPSY  08/10/2022   Procedure: BIOPSY;  Surgeon: Burnette Fallow, MD;  Location: [facility];  Service: Gastroenterology;;   CARDIOVERSION N/A 02/01/2022   Procedure: CARDIOVERSION;  Surgeon: Sheena Pugh, DO;  Location: [facility];  Service: Cardiovascular;  Laterality: N/A;   CARDIOVERSION N/A 09/20/2022   Procedure: CARDIOVERSION;  Surgeon: Cherrie Toribio SAUNDERS, MD;  Location: [facility];  Service: Cardiovascular;  Laterality: N/A;   COLONOSCOPY WITH PROPOFOL  Left 08/10/2022   Procedure: COLONOSCOPY WITH PROPOFOL ;  Surgeon: Burnette Fallow, MD;  Location: [facility];  Service: Gastroenterology;  Laterality: Left;   ESOPHAGOGASTRODUODENOSCOPY (EGD) WITH PROPOFOL  Left 08/10/2022   Procedure: ESOPHAGOGASTRODUODENOSCOPY (EGD) WITH PROPOFOL ;  Surgeon: Burnette Fallow, MD;  Location: [facility];  Service: Gastroenterology;  Laterality: Left;   POLYPECTOMY  08/10/2022   Procedure: POLYPECTOMY;  Surgeon: Burnette Fallow, MD;  Location: [facility];  Service: Gastroenterology;;   RIGHT HEART CATH N/A 10/15/2021   Procedure: RIGHT HEART CATH;  Surgeon: Elmira Newman PARAS, MD;  Location: [facility];  Service: Cardiovascular;  Laterality: N/A;   RIGHT/LEFT HEART CATH AND CORONARY ANGIOGRAPHY N/A 10/13/2023   Procedure: RIGHT/LEFT HEART CATH AND CORONARY ANGIOGRAPHY;  Surgeon: Rolan Ezra RAMAN, MD;  [facility];  Service: Cardiovascular;  Laterality: N/A;   SKIN GRAFT  2006   porcine, R leg   TEE WITHOUT CARDIOVERSION N/A 09/20/2022   Procedure: TRANSESOPHAGEAL ECHOCARDIOGRAM;  Surgeon: Cherrie Toribio SAUNDERS, MD;  Location: [facility];  Service:  Cardiovascular;  Laterality: N/A;    Current Medications: No outpatient medications have been marked as taking for the 10/23/23 encounter (Appointment) with Daneen Damien BROCKS, NP.     Allergies:   Pork-derived products   Social History   Socioeconomic History   Marital status: Single    Spouse name: Not on file   Number of children: 0   Years of education: Not on file   Highest education level: Not on file  Occupational History   Occupation: para Psychologist, forensic, part time cook  Tobacco Use   Smoking status: Former    Current packs/day: 1.00    Average packs/day: 1 pack/day for 4.0 years (4.0 ttl pk-yrs)    Types: Cigarettes   Smokeless tobacco: Never  Vaping Use   Vaping status: Never Used  Substance and Sexual Activity   Alcohol use: Not Currently    Alcohol/week: 1.0 standard drink of alcohol    Types: 1 Glasses of wine per week    Comment: I do have my wine every day, has cut down from 2 bottles at a time, currently 2-3 glasses   Drug use: No   Sexual activity: Not Currently  Other Topics Concern   Not on file  Social History Narrative   Lives by self   Social Drivers of Health   Financial Resource Strain: Low Risk  (12/03/2021)   Overall Financial Resource Strain (CARDIA)    Difficulty of Paying Living  Expenses: Not very hard  Food Insecurity: No Food Insecurity (10/11/2023)   Hunger Vital Sign    Worried About Running Out of Food in the Last Year: Never true    Ran Out of Food in the Last Year: Never true  Transportation Needs: No Transportation Needs (10/11/2023)   PRAPARE - Administrator, Civil Service (Medical): No    Lack of Transportation (Non-Medical): No  Physical Activity: Not on file  Stress: Not on file  Social Connections: Unknown (10/11/2023)   Social Connection and Isolation Panel    Frequency of Communication with Friends and Family: Once a week    Frequency of Social Gatherings with Friends and Family: Once a week    Attends  Religious Services: 1 to 4 times per year    Active Member of [employer] or Organizations: No    Attends Engineer, structural: 1 to 4 times per year    Marital Status: Patient declined     Family History: The patient's ***family history includes Coronary artery disease in an other family member; Diabetes in an other family member; Emphysema in her father; Heart disease in her mother; Ovarian cancer in her mother. There is no history of Cancer.  ROS:   Please see the history of present illness.    *** All other systems reviewed and are negative.  EKGs/Labs/Other Studies Reviewed:    The following studies were reviewed today: ***      Recent Labs: 12/28/2022: TSH 0.626 05/11/2023: B Natriuretic Peptide 551.0 10/11/2023: ALT 29 10/12/2023: Pro Brain Natriuretic Peptide 4,823.0 10/14/2023: Magnesium  2.2 10/15/2023: BUN 34; Creatinine, Ser 1.27; Hemoglobin 12.1; Platelets 219; Potassium 4.0; Sodium 135  Recent Lipid Panel    Component Value Date/Time   CHOL 178 10/14/2023 0514   TRIG 116 10/14/2023 0514   HDL 61 10/14/2023 0514   CHOLHDL 2.9 10/14/2023 0514   VLDL 23 10/14/2023 0514   LDLCALC 94 10/14/2023 0514   LDLDIRECT 121.8 05/26/2011 0928     Risk Assessment/Calculations:   {Does this patient have ATRIAL FIBRILLATION?:[phone]}  No BP recorded.  {Refresh Note OR Click here to enter BP  :1}***         Physical Exam:    VS:  There were no vitals taken for this visit.    Wt Readings from Last 3 Encounters:  10/15/23 174 lb 2.6 oz (79 kg)  05/13/23 174 lb 9.6 oz (79.2 kg)  03/06/23 192 lb 10.9 oz (87.4 kg)     GEN: *** Well nourished, well developed in no acute distress HEENT: Normal NECK: No JVD; No carotid bruits LYMPHATICS: No lymphadenopathy CARDIAC: ***RRR, no murmurs, rubs, gallops RESPIRATORY:  Clear to auscultation without rales, wheezing or rhonchi  ABDOMEN: Soft, non-tender, non-distended MUSCULOSKELETAL:  No edema; No deformity  SKIN: Warm and  dry NEUROLOGIC:  Alert and oriented x 3 PSYCHIATRIC:  Normal affect   ASSESSMENT:    No diagnosis found. PLAN:    In order of problems listed above:  ***      {Are you ordering a CV Procedure (e.g. stress test, cath, DCCV, TEE, etc)?   Press F2        :[id number]}    Medication Adjustments/Labs and Tests Ordered: Current medicines are reviewed at length with the patient today.  Concerns regarding medicines are outlined above.  No orders of the defined types were placed in this encounter.  No orders of the defined types were placed in this encounter.   There are no Patient  Instructions on file for this visit.   Signed, Miriam FORBES Shams, NP  10/22/2023 7:22 PM    [facility]

## 2023-10-23 ENCOUNTER — Ambulatory Visit: Admitting: Nurse Practitioner

## 2023-10-30 ENCOUNTER — Other Ambulatory Visit (HOSPITAL_COMMUNITY): Payer: Self-pay

## 2023-10-30 MED ORDER — ESCITALOPRAM OXALATE 5 MG PO TABS
5.0000 mg | ORAL_TABLET | Freq: Every morning | ORAL | 3 refills | Status: DC
  Filled 2023-10-30: qty 90, 90d supply, fill #0

## 2023-10-30 MED ORDER — EZETIMIBE 10 MG PO TABS
10.0000 mg | ORAL_TABLET | Freq: Every day | ORAL | 3 refills | Status: AC
  Filled 2023-10-30: qty 90, 90d supply, fill #0

## 2023-11-13 ENCOUNTER — Emergency Department (HOSPITAL_COMMUNITY)

## 2023-11-13 ENCOUNTER — Other Ambulatory Visit: Payer: Self-pay

## 2023-11-13 ENCOUNTER — Encounter (HOSPITAL_COMMUNITY): Payer: Self-pay | Admitting: Obstetrics and Gynecology

## 2023-11-13 ENCOUNTER — Inpatient Hospital Stay (HOSPITAL_COMMUNITY)
Admission: EM | Admit: 2023-11-13 | Discharge: 2023-11-18 | DRG: 871 | Disposition: A | Discharge status: Home Health | Attending: Student | Admitting: Student

## 2023-11-13 ENCOUNTER — Other Ambulatory Visit (HOSPITAL_COMMUNITY): Payer: Self-pay

## 2023-11-13 DIAGNOSIS — B3731 Acute candidiasis of vulva and vagina: Secondary | ICD-10-CM | POA: Diagnosis present

## 2023-11-13 DIAGNOSIS — Z91199 Patient's noncompliance with other medical treatment and regimen due to unspecified reason: Secondary | ICD-10-CM

## 2023-11-13 DIAGNOSIS — I480 Paroxysmal atrial fibrillation: Secondary | ICD-10-CM | POA: Diagnosis present

## 2023-11-13 DIAGNOSIS — Z6830 Body mass index (BMI) 30.0-30.9, adult: Secondary | ICD-10-CM

## 2023-11-13 DIAGNOSIS — I110 Hypertensive heart disease with heart failure: Secondary | ICD-10-CM | POA: Diagnosis present

## 2023-11-13 DIAGNOSIS — A419 Sepsis, unspecified organism: Secondary | ICD-10-CM | POA: Diagnosis not present

## 2023-11-13 DIAGNOSIS — E872 Acidosis, unspecified: Secondary | ICD-10-CM | POA: Diagnosis present

## 2023-11-13 DIAGNOSIS — Z7901 Long term (current) use of anticoagulants: Secondary | ICD-10-CM

## 2023-11-13 DIAGNOSIS — R6520 Severe sepsis without septic shock: Secondary | ICD-10-CM | POA: Diagnosis present

## 2023-11-13 DIAGNOSIS — I4891 Unspecified atrial fibrillation: Secondary | ICD-10-CM

## 2023-11-13 DIAGNOSIS — N179 Acute kidney failure, unspecified: Secondary | ICD-10-CM | POA: Diagnosis present

## 2023-11-13 DIAGNOSIS — N390 Urinary tract infection, site not specified: Secondary | ICD-10-CM | POA: Diagnosis present

## 2023-11-13 DIAGNOSIS — I5033 Acute on chronic diastolic (congestive) heart failure: Secondary | ICD-10-CM | POA: Diagnosis present

## 2023-11-13 DIAGNOSIS — Z8249 Family history of ischemic heart disease and other diseases of the circulatory system: Secondary | ICD-10-CM

## 2023-11-13 DIAGNOSIS — Z7984 Long term (current) use of oral hypoglycemic drugs: Secondary | ICD-10-CM

## 2023-11-13 DIAGNOSIS — E66811 Obesity, class 1: Secondary | ICD-10-CM | POA: Diagnosis present

## 2023-11-13 DIAGNOSIS — L03314 Cellulitis of groin: Secondary | ICD-10-CM | POA: Diagnosis present

## 2023-11-13 DIAGNOSIS — N732 Unspecified parametritis and pelvic cellulitis: Secondary | ICD-10-CM | POA: Diagnosis present

## 2023-11-13 DIAGNOSIS — B377 Candidal sepsis: Secondary | ICD-10-CM | POA: Diagnosis present

## 2023-11-13 DIAGNOSIS — Z8701 Personal history of pneumonia (recurrent): Secondary | ICD-10-CM

## 2023-11-13 DIAGNOSIS — F39 Unspecified mood [affective] disorder: Secondary | ICD-10-CM | POA: Diagnosis present

## 2023-11-13 DIAGNOSIS — Z1152 Encounter for screening for COVID-19: Secondary | ICD-10-CM

## 2023-11-13 DIAGNOSIS — N939 Abnormal uterine and vaginal bleeding, unspecified: Principal | ICD-10-CM | POA: Diagnosis present

## 2023-11-13 DIAGNOSIS — N838 Other noninflammatory disorders of ovary, fallopian tube and broad ligament: Secondary | ICD-10-CM

## 2023-11-13 DIAGNOSIS — Z79899 Other long term (current) drug therapy: Secondary | ICD-10-CM

## 2023-11-13 DIAGNOSIS — D6859 Other primary thrombophilia: Secondary | ICD-10-CM | POA: Diagnosis present

## 2023-11-13 DIAGNOSIS — I421 Obstructive hypertrophic cardiomyopathy: Secondary | ICD-10-CM | POA: Diagnosis present

## 2023-11-13 DIAGNOSIS — L03115 Cellulitis of right lower limb: Secondary | ICD-10-CM | POA: Diagnosis present

## 2023-11-13 DIAGNOSIS — I872 Venous insufficiency (chronic) (peripheral): Secondary | ICD-10-CM | POA: Diagnosis present

## 2023-11-13 DIAGNOSIS — Z8041 Family history of malignant neoplasm of ovary: Secondary | ICD-10-CM

## 2023-11-13 DIAGNOSIS — D4959 Neoplasm of unspecified behavior of other genitourinary organ: Secondary | ICD-10-CM | POA: Diagnosis present

## 2023-11-13 DIAGNOSIS — Z91014 Allergy to mammalian meats: Secondary | ICD-10-CM

## 2023-11-13 DIAGNOSIS — I272 Pulmonary hypertension, unspecified: Secondary | ICD-10-CM | POA: Diagnosis present

## 2023-11-13 DIAGNOSIS — Z825 Family history of asthma and other chronic lower respiratory diseases: Secondary | ICD-10-CM

## 2023-11-13 DIAGNOSIS — F17210 Nicotine dependence, cigarettes, uncomplicated: Secondary | ICD-10-CM | POA: Diagnosis present

## 2023-11-13 DIAGNOSIS — Z833 Family history of diabetes mellitus: Secondary | ICD-10-CM

## 2023-11-13 DIAGNOSIS — E785 Hyperlipidemia, unspecified: Secondary | ICD-10-CM | POA: Diagnosis present

## 2023-11-13 DIAGNOSIS — N762 Acute vulvitis: Secondary | ICD-10-CM | POA: Insufficient documentation

## 2023-11-13 LAB — RESP PANEL BY RT-PCR (RSV, FLU A&B, COVID)  RVPGX2
Influenza A by PCR: NEGATIVE
Influenza B by PCR: NEGATIVE
Resp Syncytial Virus by PCR: NEGATIVE
SARS Coronavirus 2 by RT PCR: NEGATIVE

## 2023-11-13 LAB — I-STAT CHEM 8, ED
BUN: 22 mg/dL (ref 8–23)
Calcium, Ion: 1.13 mmol/L — ABNORMAL LOW (ref 1.15–1.40)
Chloride: 113 mmol/L — ABNORMAL HIGH (ref 98–111)
Creatinine, Ser: 1.1 mg/dL — ABNORMAL HIGH (ref 0.44–1.00)
Glucose, Bld: 97 mg/dL (ref 70–99)
HCT: 38 % (ref 36.0–46.0)
Hemoglobin: 12.9 g/dL (ref 12.0–15.0)
Potassium: 4.5 mmol/L (ref 3.5–5.1)
Sodium: 138 mmol/L (ref 135–145)
TCO2: 15 mmol/L — ABNORMAL LOW (ref 22–32)

## 2023-11-13 LAB — COMPREHENSIVE METABOLIC PANEL WITH GFR
ALT: 51 U/L — ABNORMAL HIGH (ref 0–44)
AST: 46 U/L — ABNORMAL HIGH (ref 15–41)
Albumin: 3 g/dL — ABNORMAL LOW (ref 3.5–5.0)
Alkaline Phosphatase: 216 U/L — ABNORMAL HIGH (ref 38–126)
Anion gap: 13 (ref 5–15)
BUN: 18 mg/dL (ref 8–23)
CO2: 16 mmol/L — ABNORMAL LOW (ref 22–32)
Calcium: 8.9 mg/dL (ref 8.9–10.3)
Chloride: 110 mmol/L (ref 98–111)
Creatinine, Ser: 1.08 mg/dL — ABNORMAL HIGH (ref 0.44–1.00)
GFR, Estimated: 55 mL/min — ABNORMAL LOW (ref >60)
Glucose, Bld: 97 mg/dL (ref 70–99)
Potassium: 4.8 mmol/L (ref 3.5–5.1)
Sodium: 139 mmol/L (ref 135–145)
Total Bilirubin: 1 mg/dL (ref 0.0–1.2)
Total Protein: 6.2 g/dL — ABNORMAL LOW (ref 6.5–8.1)

## 2023-11-13 LAB — URINALYSIS, W/ REFLEX TO CULTURE (INFECTION SUSPECTED)
Bacteria, UA: NONE SEEN
Bilirubin Urine: NEGATIVE
Glucose, UA: 50 mg/dL — AB
Hgb urine dipstick: NEGATIVE
Ketones, ur: 5 mg/dL — AB
Leukocytes,Ua: NEGATIVE
Nitrite: NEGATIVE
Protein, ur: 30 mg/dL — AB
Specific Gravity, Urine: >1.046 — ABNORMAL HIGH (ref 1.005–1.030)
pH: 5 (ref 5.0–8.0)

## 2023-11-13 LAB — CBC WITH DIFFERENTIAL/PLATELET
Abs Immature Granulocytes: 0.11 K/uL — ABNORMAL HIGH (ref 0.00–0.07)
Basophils Absolute: 0.1 K/uL (ref 0.0–0.1)
Basophils Relative: 1 %
Eosinophils Absolute: 0.1 K/uL (ref 0.0–0.5)
Eosinophils Relative: 1 %
HCT: 39.3 % (ref 36.0–46.0)
Hemoglobin: 12.4 g/dL (ref 12.0–15.0)
Immature Granulocytes: 1 %
Lymphocytes Relative: 6 %
Lymphs Abs: 0.9 K/uL (ref 0.7–4.0)
MCH: 29.5 pg (ref 26.0–34.0)
MCHC: 31.6 g/dL (ref 30.0–36.0)
MCV: 93.6 fL (ref 80.0–100.0)
Monocytes Absolute: 0.9 K/uL (ref 0.1–1.0)
Monocytes Relative: 6 %
Neutro Abs: 13.1 K/uL — ABNORMAL HIGH (ref 1.7–7.7)
Neutrophils Relative %: 85 %
Platelets: 214 K/uL (ref 150–400)
RBC: 4.2 MIL/uL (ref 3.87–5.11)
RDW: 17 % — ABNORMAL HIGH (ref 11.5–15.5)
WBC: 15.2 K/uL — ABNORMAL HIGH (ref 4.0–10.5)
nRBC: 0 % (ref 0.0–0.2)

## 2023-11-13 LAB — BRAIN NATRIURETIC PEPTIDE: B Natriuretic Peptide: 915.1 pg/mL — ABNORMAL HIGH (ref 0.0–100.0)

## 2023-11-13 LAB — I-STAT VENOUS BLOOD GAS, ED
Acid-base deficit: 10 mmol/L — ABNORMAL HIGH (ref 0.0–2.0)
Bicarbonate: 14.2 mmol/L — ABNORMAL LOW (ref 20.0–28.0)
Calcium, Ion: 1.13 mmol/L — ABNORMAL LOW (ref 1.15–1.40)
HCT: 37 % (ref 36.0–46.0)
Hemoglobin: 12.6 g/dL (ref 12.0–15.0)
O2 Saturation: 97 %
Potassium: 4.4 mmol/L (ref 3.5–5.1)
Sodium: 137 mmol/L (ref 135–145)
TCO2: 15 mmol/L — ABNORMAL LOW (ref 22–32)
pCO2, Ven: 27.1 mmHg — ABNORMAL LOW (ref 44–60)
pH, Ven: 7.327 (ref 7.25–7.43)
pO2, Ven: 93 mmHg — ABNORMAL HIGH (ref 32–45)

## 2023-11-13 LAB — I-STAT CG4 LACTIC ACID, ED
Lactic Acid, Venous: 3.8 mmol/L (ref 0.5–1.9)
Lactic Acid, Venous: 2.2 mmol/L (ref 0.5–1.9)

## 2023-11-13 LAB — PROTIME-INR
INR: 1 (ref 0.8–1.2)
Prothrombin Time: 14 s (ref 11.4–15.2)

## 2023-11-13 LAB — MAGNESIUM: Magnesium: 1.8 mg/dL (ref 1.7–2.4)

## 2023-11-13 LAB — HIV ANTIBODY (ROUTINE TESTING W REFLEX): HIV Screen 4th Generation wRfx: NONREACTIVE

## 2023-11-13 LAB — TSH: TSH: 0.445 u[IU]/mL (ref 0.350–4.500)

## 2023-11-13 MED ORDER — AMIODARONE HCL IN DEXTROSE 360-4.14 MG/200ML-% IV SOLN
60.0000 mg/h | INTRAVENOUS | Status: AC
  Administered 2023-11-13: 60 mg/h via INTRAVENOUS
  Filled 2023-11-13: qty 200

## 2023-11-13 MED ORDER — VALACYCLOVIR HCL 500 MG PO TABS
1000.0000 mg | ORAL_TABLET | Freq: Two times a day (BID) | ORAL | Status: DC
  Administered 2023-11-13 – 2023-11-15 (×5): 1000 mg via ORAL
  Filled 2023-11-13 (×6): qty 2

## 2023-11-13 MED ORDER — VANCOMYCIN HCL IN DEXTROSE 1-5 GM/200ML-% IV SOLN
1000.0000 mg | Freq: Once | INTRAVENOUS | Status: AC
  Administered 2023-11-13: 1000 mg via INTRAVENOUS
  Filled 2023-11-13: qty 200

## 2023-11-13 MED ORDER — VANCOMYCIN HCL 750 MG/150ML IV SOLN
750.0000 mg | INTRAVENOUS | Status: DC
  Administered 2023-11-14: 750 mg via INTRAVENOUS
  Filled 2023-11-13 (×5): qty 150

## 2023-11-13 MED ORDER — LACTATED RINGERS IV BOLUS
500.0000 mL | Freq: Once | INTRAVENOUS | Status: AC
  Administered 2023-11-13: 500 mL via INTRAVENOUS

## 2023-11-13 MED ORDER — FENTANYL CITRATE (PF) 50 MCG/ML IJ SOSY
50.0000 ug | PREFILLED_SYRINGE | INTRAMUSCULAR | Status: DC | PRN
  Administered 2023-11-13 – 2023-11-14 (×4): 50 ug via INTRAVENOUS
  Filled 2023-11-13 (×4): qty 1

## 2023-11-13 MED ORDER — LACTATED RINGERS IV BOLUS (SEPSIS)
1000.0000 mL | Freq: Once | INTRAVENOUS | Status: AC
  Administered 2023-11-13: 1000 mL via INTRAVENOUS

## 2023-11-13 MED ORDER — PANTOPRAZOLE SODIUM 40 MG PO TBEC
40.0000 mg | DELAYED_RELEASE_TABLET | Freq: Every day | ORAL | Status: DC
  Administered 2023-11-13 – 2023-11-18 (×6): 40 mg via ORAL
  Filled 2023-11-13 (×6): qty 1

## 2023-11-13 MED ORDER — IOHEXOL 350 MG/ML SOLN
75.0000 mL | Freq: Once | INTRAVENOUS | Status: AC | PRN
  Administered 2023-11-13: 75 mL via INTRAVENOUS

## 2023-11-13 MED ORDER — BARRIER CREAM NON-SPECIFIED: 1.0000 | TOPICAL_CREAM | Freq: Two times a day (BID) | TOPICAL | Status: AC | PRN

## 2023-11-13 MED ORDER — AMIODARONE LOAD VIA INFUSION
150.0000 mg | Freq: Once | INTRAVENOUS | Status: AC
  Administered 2023-11-13: 150 mg via INTRAVENOUS
  Filled 2023-11-13: qty 83.34

## 2023-11-13 MED ORDER — ATORVASTATIN CALCIUM 80 MG PO TABS
80.0000 mg | ORAL_TABLET | Freq: Every day | ORAL | Status: DC
  Administered 2023-11-14 – 2023-11-18 (×5): 80 mg via ORAL
  Filled 2023-11-13: qty 2
  Filled 2023-11-13 (×4): qty 1

## 2023-11-13 MED ORDER — FENTANYL CITRATE (PF) 50 MCG/ML IJ SOSY
100.0000 ug | PREFILLED_SYRINGE | Freq: Once | INTRAMUSCULAR | Status: DC
  Filled 2023-11-13: qty 2

## 2023-11-13 MED ORDER — SODIUM CHLORIDE 0.9 % IV SOLN
2.0000 g | Freq: Once | INTRAVENOUS | Status: AC
  Administered 2023-11-13: 2 g via INTRAVENOUS
  Filled 2023-11-13: qty 12.5

## 2023-11-13 MED ORDER — SODIUM CHLORIDE 0.9 % IV SOLN
2.0000 g | Freq: Two times a day (BID) | INTRAVENOUS | Status: DC
  Administered 2023-11-13 – 2023-11-15 (×4): 2 g via INTRAVENOUS
  Filled 2023-11-13 (×4): qty 12.5

## 2023-11-13 MED ORDER — SODIUM CHLORIDE 0.9 % IV SOLN
INTRAVENOUS | Status: AC
Start: 2023-11-13 — End: 2023-11-14

## 2023-11-13 MED ORDER — APIXABAN 5 MG PO TABS
5.0000 mg | ORAL_TABLET | Freq: Two times a day (BID) | ORAL | Status: DC
  Administered 2023-11-13 – 2023-11-18 (×10): 5 mg via ORAL
  Filled 2023-11-13 (×10): qty 1

## 2023-11-13 MED ORDER — METRONIDAZOLE 500 MG/100ML IV SOLN
500.0000 mg | Freq: Once | INTRAVENOUS | Status: AC
  Administered 2023-11-13: 500 mg via INTRAVENOUS
  Filled 2023-11-13: qty 100

## 2023-11-13 MED ORDER — ESCITALOPRAM OXALATE 10 MG PO TABS
5.0000 mg | ORAL_TABLET | Freq: Every day | ORAL | Status: DC
  Administered 2023-11-14 – 2023-11-18 (×5): 5 mg via ORAL
  Filled 2023-11-13 (×5): qty 1

## 2023-11-13 MED ORDER — AMIODARONE HCL IN DEXTROSE 360-4.14 MG/200ML-% IV SOLN
30.0000 mg/h | INTRAVENOUS | Status: DC
  Administered 2023-11-13 – 2023-11-14 (×2): 30 mg/h via INTRAVENOUS
  Filled 2023-11-13 (×2): qty 200

## 2023-11-13 MED ORDER — FLUCONAZOLE 150 MG PO TABS: 150.0000 mg | ORAL_TABLET | ORAL | Status: DC

## 2023-11-13 MED ORDER — FLUCONAZOLE 150 MG PO TABS
150.0000 mg | ORAL_TABLET | ORAL | 0 refills | Status: DC
  Filled 2023-11-13: qty 2, 4d supply, fill #0

## 2023-11-13 MED ORDER — NOREPINEPHRINE 4 MG/250ML-% IV SOLN
0.0000 ug/min | INTRAVENOUS | Status: DC
  Filled 2023-11-13: qty 250

## 2023-11-13 MED ORDER — METOPROLOL SUCCINATE ER 25 MG PO TB24: 25.0000 mg | ORAL_TABLET | Freq: Every day | ORAL | Status: DC

## 2023-11-13 NOTE — Consult Note (Signed)
 Gynecology Consult Note  Date of Consult: 11/13/2023   Requesting Provider: [facility]  Primary OBGYN: None Primary Care Provider: [name]  Reason for Admission: concern for sepsis  History of Present Illness: Ms. Economos is a 68 y.o. G0P0000 (No LMP recorded. Patient is postmenopausal.), with the above CC. History significant for CHF, HOCM, pulmonary HTN  Patient states over the past few days she's had increased vulvar irritation. She states her PCP called in diflucan two days ago and she started some type of OTC vaginal/vulvar treatment; pt unable to say if it was a wash or some type of cream or ointment. No prior h/o s/s.   She denies any VB, ?discharge, constipation or diarrhea and no nausea or vomiting.     ED exam significant for significant vulvar erythema and CT scan showed large 9-12cm bilateral adnexal masses; there has been no prior imaging  ROS: A 12-point review of systems was performed and negative, except as stated in the above HPI.  OBGYN History: As per HPI. OB History  Gravida Para Term Preterm AB Living  0 0 0 0 0 0  SAB IAB Ectopic Multiple Live Births  0 0 0 0 0   Periods: patient states her periods stopped in her 20s due to a microadenoma. Pt denies a history of ever having hot flashes or night sweats History of pap smears: No. HRT use: No  Past Medical History: 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   2D ECHO - EF >55%, normal   HTN (hypertension)    Hyperprolactinemia    dx in her 20, took meds, self d/c a while back   Liver function test abnormality    normal when repeated   Mild hyperlipidemia    Obesity    Palpitations    negative stress echo in November 2012 with normal LV function; mild LVH, proximal septal  thickening with narrow LVOT and mild gradient; mild MR and TR; Cardionet showed PACs in November 2012   Pyoderma gangrenosa (HCC)    Shortness of breath 07/11/2011   MET TEST   Past Surgical History: Past Surgical History:  Procedure Laterality Date   BIOPSY  08/10/2022   Procedure: BIOPSY;  Surgeon: Burnette Fallow, MD;  Location: [facility];  Service: Gastroenterology;;   CARDIOVERSION N/A 02/01/2022   Procedure: CARDIOVERSION;  Surgeon: Sheena Pugh, DO;  Location: [facility];  Service: Cardiovascular;  Laterality: N/A;   CARDIOVERSION N/A 09/20/2022   Procedure: CARDIOVERSION;  Surgeon: Cherrie Toribio SAUNDERS, MD;  Location: [facility];  Service: Cardiovascular;  Laterality: N/A;   COLONOSCOPY WITH PROPOFOL  Left 08/10/2022   Procedure: COLONOSCOPY WITH PROPOFOL ;  Surgeon: Burnette Fallow, MD;  Location: [facility];  Service: Gastroenterology;  Laterality: Left;   ESOPHAGOGASTRODUODENOSCOPY (EGD) WITH PROPOFOL  Left 08/10/2022   Procedure: ESOPHAGOGASTRODUODENOSCOPY (EGD) WITH PROPOFOL ;  Surgeon: Burnette Fallow, MD;  Location: [facility];  Service: Gastroenterology;  Laterality: Left;   POLYPECTOMY  08/10/2022   Procedure: POLYPECTOMY;  Surgeon: Burnette Fallow, MD;  Location: [facility];  Service: Gastroenterology;;   RIGHT HEART CATH N/A 10/15/2021   Procedure: RIGHT HEART CATH;  Surgeon: Elmira Newman PARAS, MD;  Location: [facility];  Service: Cardiovascular;  Laterality: N/A;   RIGHT/LEFT HEART CATH AND CORONARY ANGIOGRAPHY N/A 10/13/2023   Procedure:  RIGHT/LEFT HEART CATH AND CORONARY ANGIOGRAPHY;  Surgeon: Rolan Ezra RAMAN, MD;  Location: [facility];  Service: Cardiovascular;  Laterality: N/A;   SKIN GRAFT  2006   porcine, R leg   TEE WITHOUT CARDIOVERSION N/A 09/20/2022   Procedure: TRANSESOPHAGEAL ECHOCARDIOGRAM;  Surgeon: Cherrie Toribio SAUNDERS, MD;  Location: [facility];  Service: Cardiovascular;  Laterality: N/A;   Family History:  Family History   Problem Relation Age of Onset   Heart disease Mother        endocarditis   Ovarian cancer Mother    Emphysema Father    Coronary artery disease Other        F in his 82   Diabetes Other        GP   Cancer Neg Hx    Social History:  Social History   Socioeconomic History   Marital status: Single    Spouse name: Not on file   Number of children: 0   Years of education: Not on file   Highest education level: Not on file  Occupational History   Occupation: para psychologist, forensic, part time cook  Tobacco Use   Smoking status: Former    Current packs/day: 1.00    Average packs/day: 1 pack/day for 4.0 years (4.0 ttl pk-yrs)    Types: Cigarettes   Smokeless tobacco: Never  Vaping Use   Vaping status: Never Used  Substance and Sexual Activity   Alcohol use: Not Currently    Alcohol/week: 1.0 standard drink of alcohol    Types: 1 Glasses of wine per week    Comment: I do have my wine every day, has cut down from 2 bottles at a time, currently 2-3 glasses   Drug use: No   Sexual activity: Not Currently  Other Topics Concern   Not on file  Social History Narrative   Lives by self   Social Drivers of Health   Financial Resource Strain: Low Risk  (12/03/2021)   Overall Financial Resource Strain (CARDIA)    Difficulty of Paying Living Expenses: Not very hard  Food Insecurity: No Food Insecurity (10/11/2023)   Hunger Vital Sign    Worried About Running Out of Food in the Last Year: Never true    Ran Out of Food in the Last Year: Never true  Transportation Needs: No Transportation Needs (10/11/2023)   PRAPARE - Administrator, Civil Service (Medical): No    Lack of Transportation (Non-Medical): No  Physical Activity: Not on file  Stress: Not on file  Social Connections: Unknown (10/11/2023)   Social Connection and Isolation Panel    Frequency of Communication with Friends and Family: Once a week    Frequency of Social Gatherings with Friends and Family: Once a  week    Attends Religious Services: 1 to 4 times per year    Active Member of [employer] or Organizations: No    Attends Banker Meetings: 1 to 4 times per year    Marital Status: Patient declined  Intimate Partner Violence: Not At Risk (10/11/2023)   Humiliation, Afraid, Rape, and Kick questionnaire    Fear of Current or Ex-Partner: No    Emotionally Abused: No    Physically Abused: No    Sexually Abused: No    Allergy: Allergies  Allergen Reactions   Porcine (Pork) Protein-Containing Drug Products Other (See Comments)    Religious preference    Current Facility-Administered Medications  Medication Dose Route Frequency Provider Last Rate Last  Admin   amiodarone (NEXTERONE) 1.8 mg/mL load via infusion 150 mg  150 mg Intravenous Once Melvenia Motto, MD       Followed by   amiodarone (NEXTERONE PREMIX) 360-4.14 MG/200ML-% (1.8 mg/mL) IV infusion  60 mg/hr Intravenous Continuous Melvenia Motto, MD       Followed by   amiodarone (NEXTERONE PREMIX) 360-4.14 MG/200ML-% (1.8 mg/mL) IV infusion  30 mg/hr Intravenous Continuous Melvenia Motto, MD       fentaNYL  (SUBLIMAZE ) injection 100 mcg  100 mcg Intravenous Once Melvenia Motto, MD       fentaNYL  (SUBLIMAZE ) injection 50 mcg  50 mcg Intravenous Q1H PRN Melvenia Motto, MD   50 mcg at 11/13/23 1321   norepinephrine (LEVOPHED) 4mg  in (0.016 mg/mL) premix infusion  0-40 mcg/min Intravenous Continuous Melvenia Motto, MD       Current Outpatient Medications  Medication Sig Dispense Refill   acetaminophen  (TYLENOL ) 325 MG tablet Take 2 tablets (650 mg total) by mouth every 6 (six) hours as needed for mild pain (pain score 1-3) or fever (or Fever >/= 101).     allopurinol  (ZYLOPRIM ) 100 MG tablet Take 1 tablet (100 mg total) by mouth daily. 90 tablet 0   apixaban  (ELIQUIS ) 5 MG TABS tablet Take 1 tablet (5 mg total) by mouth 2 (two) times daily. 60 tablet 5   ascorbic acid  (VITAMIN C ) 500 MG tablet Take 1 tablet (500 mg total) by mouth daily for 30  days 100 tablet 0   atorvastatin  (LIPITOR) 80 MG tablet Take 1 tablet (80 mg total) by mouth daily. 90 tablet 0   empagliflozin  (JARDIANCE ) 10 MG TABS tablet Take 1 tablet (10 mg total) by mouth daily. 90 tablet 0   escitalopram  (LEXAPRO ) 5 MG tablet Take 1 tablet (5 mg total) by mouth in the morning for moods. 90 tablet 3   ezetimibe  (ZETIA ) 10 MG tablet Take 1 tablet (10 mg total) by mouth daily for cholesterol. 90 tablet 3   fluconazole (DIFLUCAN) 150 MG tablet Take 1 tablet (150 mg total) by mouth now,may repeat in 72 hours if needed and symptoms persist. 2 tablet 0   furosemide  (LASIX ) 40 MG tablet Take 1 tablet (40 mg total) by mouth in the morning. 90 tablet 0   metoprolol  succinate (TOPROL -XL) 25 MG 24 hr tablet Take 1 tablet (25 mg total) by mouth daily. 90 tablet 0   pantoprazole  (PROTONIX ) 40 MG tablet Take 1 tablet (40 mg total) by mouth daily. 90 tablet 0   Potassium Chloride  ER 20 MEQ TBCR Take 1 tablet (20 mEq total) by mouth every other day. (Patient taking differently: Take 20 mEq by mouth in the morning.) 45 tablet 2   Zinc  Sulfate 220 (50 Zn) MG TABS Take 1 tablet (220 mg total) by mouth daily for 30 days 100 tablet 0    Physical Exam: Patient Vitals for the past 24 hrs:  BP Temp Temp src Pulse Resp SpO2 Height Weight  11/13/23 1315 98/84 -- -- (!) 127 (!) 32 95 % -- --  11/13/23 1245 95/84 -- -- (!) 150 (!) 32 97 % -- --  11/13/23 1215 107/80 -- -- (!) 126 (!) 27 97 % -- --  11/13/23 1145 112/80 -- -- (!) 129 20 100 % -- --  11/13/23 1030 (!) 126/94 -- -- (!) 124 18 94 % -- --  11/13/23 1015 111/86 -- -- (!) 119 20 95 % -- --  11/13/23 1000 116/88 -- -- (!) 124 18 95 % -- --  11/13/23 0945 110/78 -- -- (!) 125 (!) 29 98 % -- --  11/13/23 0943 (!) 118/97 -- -- (!) 134 (!) 27 98 % -- --  11/13/23 0941 -- 99.1 F (37.3 C) Rectal -- -- -- -- --  11/13/23 0915 (!) 120/105 -- -- (!) 119 (!) 26 100 % -- --  11/13/23 0900 116/80 -- -- (!) 131 (!) 27 93 % -- --  11/13/23 0845  131/80 -- -- (!) 117 (!) 23 97 % -- --  11/13/23 0837 -- -- -- -- -- -- 5' 4 (1.626 m) 81.6 kg  11/13/23 0833 -- -- -- -- (!) 24 97 % -- --  11/13/23 0833 -- 98.1 F (36.7 C) -- -- -- -- -- --  11/13/23 0830 133/89 -- -- -- (!) 27 -- -- --    Body mass index is 30.9 kg/m. General appearance: Well nourished, well developed female appears somewhat stressed and uncomfortable Respiratory: Normal respiratory effort Abdomen: soft, mildly distended, nttp Neuro/Psych:  Normal mood and affect.  Skin:  Warm and dry.  Extremities: no clubbing, cyanosis, or edema.  Pelvic exam: VULVA: with significant erythema from the mid inguinal fold bilaterally down past the mons and track past her rectum, exam chaperoned by RN.  Laboratory: BCx x 2: pending Urinalysis with glucose, ketones, protein  A1c 10/12/23 4.2 Negative covid, rsv, flu, tsh, pt/inr BNP 915 (551 April 2025) Istat lactic acid 3.8>2.2  Recent Labs  Lab 11/13/23 0920 11/13/23 0921  WBC 15.2*  --   HGB 12.4 12.6  12.9  HCT 39.3 37.0  38.0  PLT 214  --    Recent Labs  Lab 11/13/23 0920 11/13/23 0921  NA 139 137  138  K 4.8 4.4  4.5  CL 110 113*  CO2 16*  --   BUN 18 22  CREATININE 1.08* 1.10*  CALCIUM  8.9  --   PROT 6.2*  --   BILITOT 1.0  --   ALKPHOS 216*  --   ALT 51*  --   AST 46*  --   GLUCOSE 97 97   Recent Labs  Lab 11/13/23 0920  INR 1.0   Imaging:  Narrative & Impression  CLINICAL DATA:  Sepsis * Tracking Code: BO *   EXAM: CT CHEST, ABDOMEN, AND PELVIS WITH CONTRAST   TECHNIQUE: Multidetector CT imaging of the chest, abdomen and pelvis was performed following the standard protocol during bolus administration of intravenous contrast.   RADIATION DOSE REDUCTION: This exam was performed according to the departmental dose-optimization program which includes automated exposure control, adjustment of the mA and/or kV according to patient size and/or use of iterative reconstruction technique.    CONTRAST:  75mL OMNIPAQUE  IOHEXOL  350 MG/ML SOLN   COMPARISON:  CT chest angiogram, 05/10/2023   FINDINGS: CT CHEST FINDINGS   Cardiovascular: Scattered aortic atherosclerosis. Cardiomegaly. Three-vessel coronary artery calcifications. No pericardial effusion.   Mediastinum/Nodes: Unchanged prominent mediastinal lymph nodes, high right paratracheal node measuring 1.6 x 0.8 cm (series 3, image 13). Heterogeneous nodular thyroid and isthmus (series 3, image 12). Trachea, and esophagus demonstrate no significant findings.   Lungs/Pleura: Diffuse interlobular septal thickening, mosaic attenuation of the airspaces, and diffuse bilateral bronchial wall thickening. Small benign calcified nodule of the medial right lung base requiring no further follow-up or characterization (series 5, image 110). No pleural effusion or pneumothorax.   Musculoskeletal: No chest wall abnormality. No acute osseous findings.   CT ABDOMEN PELVIS FINDINGS   Hepatobiliary: No solid liver abnormality is  seen. No gallstones, gallbladder wall thickening, or biliary dilatation.   Pancreas: Unremarkable. No pancreatic ductal dilatation or surrounding inflammatory changes.   Spleen: Normal in size without significant abnormality.   Adrenals/Urinary Tract: Adrenal glands are unremarkable. Kidneys are normal, without renal calculi, solid lesion, or hydronephrosis. Bladder is unremarkable.   Stomach/Bowel: Stomach is within normal limits. Appendix appears normal. No evidence of bowel wall thickening, distention, or inflammatory changes.   Vascular/Lymphatic: Aortic atherosclerosis. No enlarged abdominal or pelvic lymph nodes.   Reproductive: Large cystic lesions of the ovaries, on the right, situated in the right lower quadrant measuring 12.2 x 8.1 x 8.4 cm (series 3, image 93) and arising from the left ovary situated in the posterior pelvis with a visible internal septation measuring 9.0 x 7.9 x 6.6 cm  (series 3, image 100).   Other: No abdominal wall hernia or abnormality. No ascites.   Musculoskeletal: No acute osseous findings.   IMPRESSION: 1. Diffuse interlobular septal thickening, mosaic attenuation of the airspaces, and diffuse bilateral bronchial wall thickening, consistent with pulmonary edema. 2. Cardiomegaly and coronary artery disease. 3. Large cystic lesions of the ovaries, on the right, situated in the right lower quadrant measuring 12.2 x 8.1 x 8.4 cm and arising from the left ovary situated in the posterior pelvis with a visible internal septation measuring 9.0 x 7.9 x 6.6 cm. 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 Recommend gynecologic referral and consideration of further evaluation with pelvic ultrasound and/or contrast enhanced pelvic MRI. 4. Unchanged prominent mediastinal lymph nodes, nonspecific and likely reactive. 5. Heterogeneous nodular thyroid and isthmus. Recommend dedicated thyroid ultrasound on a nonemergent, outpatient basis if and when clinically appropriate.   Aortic Atherosclerosis (ICD10-I70.0).     Electronically Signed   By: Marolyn JONETTA Jaksch M.D.   On: 11/13/2023 13:41   Port cxr negative  Assessment: patient stable  Plan: S/p cefepime , vanc and flagyl and agree with broad spec abx. Follow up GYN swab and I forgot to ask her if she has a h/o HSV or if she is sexually active, but I started her on valtrex 1000 bid and wrote for her to get a few more doses of q72h diflucan (last dose as an outpatient yesterday).   ED also asked to obtain a CA125; findings d/w pt. Will need d/w Gyn Onc at some point, whether in or outpatient.   GYN will continue to follow.   Total time taking care of the patient was 45 minutes, with greater than 50% of the time spent in face to face interaction with the patient.  Bebe Izell Raddle MD Attending Center for [facility] GYN Consult Phone: [phone] (M-F, [phone])  & [phone] (Off hours, weekends, holidays)

## 2023-11-13 NOTE — Progress Notes (Deleted)
 Cardiology Office Note:    Date:  11/13/2023   ID:  Christine Jacobson, DOB [birth year], MRN [id number]  PCP:  [name], NP   [facility] Providers Cardiologist:  Dub Huntsman, DO { Click to update primary MD,subspecialty MD or APP then REFRESH:1}    Referring MD: [name], NP   No chief complaint on file. ***  History of Present Illness:    Christine Jacobson is a 68 y.o. female with a hx of HOCM, chronic diastolic CHF, moderate pulmonary hypertension, persistent atrial fibrillation on chronic anticoagulation with Eliquis , hypertension, hyperlipidemia, GERD with history of GI bleed and chronic anemia presenting to the office today for follow-up regarding recent hospitalization for CHF.   She has a history of nonadherence to medications and appointments.   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  Lexiscan  Myoview showed no ischemia, EKG positive for changes in inferior  leads.  Following discussion with PCP, medical therapy was pursued, no cardiac cath performed at that time.  Admitted 04/2023 with CHF exacerbation, improved with IV diuresis.  Was then lost to follow-up.  Echo at that time was consistent with HOCM and severe concentric LVH with basal septum 2.3 cm and LVOT gradient 36 mmHg.  EF 60-65%, mild RV function.  Patient had multiple cancellations for office visits with cardiology including an office visit with Dr. Santo for HOCM.  She was hospitalized on 10/11/2023 with CHF exacerbation and Afib with CVR. R/LHC on 10/13/2023 showed normal coronary arteries, normal filling pressures, minimal AV gradient, ongoing medical management was recommended.  Echo 10/14/2023 consistent with HOCM.    She was discharged on 25 mg Toprol , 10 mg Jardiance , 40 mg Lasix  daily.  She was continued on 80 mg Lipitor and 5 mg Eliquis  twice daily.       Chronic diastolic CHF HOCM PHTN - GDMT:  Continue Toprol  XL 25 mg daily, Jardiance  10 mg daily, 40 mg lasix  daily - refer to Dr. Santo   Persistent atrial fibrillation Chronic anticoagulation -- pursued rate control given LAE CHA2DS2-VASc = 4 Continue Eliquis  5 mg twice daily Continue Toprol -XL 25 mg daily   Minimal CAD Hyperlipidemia with LDL goal < 70 - no ASA given eliquis  - continue statin and zetia  10/14/2023: Cholesterol 178; HDL 61; LDL Cholesterol 94; Triglycerides 116; VLDL 23   Chronic normocytic anemia ***Recheck CBC      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   2D ECHO - EF >55%, normal   HTN (hypertension)    Hyperprolactinemia    dx in her 18, took meds, self d/c a while back   Liver function test abnormality    normal when repeated   Mild hyperlipidemia    Obesity    Palpitations    negative stress echo in November 2012 with normal LV function; mild LVH, proximal septal thickening with narrow LVOT and mild gradient; mild MR and TR; Cardionet showed PACs in November 2012   Pyoderma gangrenosa    Shortness of breath 07/11/2011   MET TEST    Past Surgical History:  Procedure Laterality Date   BIOPSY  08/10/2022   Procedure: BIOPSY;  Surgeon: Burnette Fallow, MD;  Location: [facility];  Service: Gastroenterology;;  CARDIOVERSION N/A 02/01/2022   Procedure: CARDIOVERSION;  Surgeon: Sheena Pugh, DO;  Location: [facility];  Service: Cardiovascular;  Laterality: N/A;   CARDIOVERSION N/A 09/20/2022   Procedure: CARDIOVERSION;  Surgeon: Cherrie Toribio SAUNDERS, MD;  Location: [facility];  Service: Cardiovascular;  Laterality: N/A;   COLONOSCOPY WITH PROPOFOL  Left 08/10/2022   Procedure: COLONOSCOPY WITH PROPOFOL ;  Surgeon: Burnette Fallow, MD;  Location: [facility];   Service: Gastroenterology;  Laterality: Left;   ESOPHAGOGASTRODUODENOSCOPY (EGD) WITH PROPOFOL  Left 08/10/2022   Procedure: ESOPHAGOGASTRODUODENOSCOPY (EGD) WITH PROPOFOL ;  Surgeon: Burnette Fallow, MD;  Location: [facility];  Service: Gastroenterology;  Laterality: Left;   POLYPECTOMY  08/10/2022   Procedure: POLYPECTOMY;  Surgeon: Burnette Fallow, MD;  Location: [facility];  Service: Gastroenterology;;   RIGHT HEART CATH N/A 10/15/2021   Procedure: RIGHT HEART CATH;  Surgeon: Elmira Newman PARAS, MD;  Location: [facility];  Service: Cardiovascular;  Laterality: N/A;   RIGHT/LEFT HEART CATH AND CORONARY ANGIOGRAPHY N/A 10/13/2023   Procedure: RIGHT/LEFT HEART CATH AND CORONARY ANGIOGRAPHY;  Surgeon: Rolan Ezra RAMAN, MD;  Location: [facility]AS[facility] CV [facility];  Service: Cardiovascular;  Laterality: N/A;   SKIN GRAFT  2006   porcine, R leg   TEE WITHOUT CARDIOVERSION N/A 09/20/2022   Procedure: TRANSESOPHAGEAL ECHOCARDIOGRAM;  Surgeon: Cherrie Toribio SAUNDERS, MD;  Location: [facility];  Service: Cardiovascular;  Laterality: N/A;    Current Medications: No outpatient medications have been marked as taking for the 11/15/23 encounter (Appointment) with Madie Jon Garre, PA.     Allergies:   Porcine (pork) protein-containing drug products   Social History   Socioeconomic History   Marital status: Single    Spouse name: Not on file   Number of children: 0   Years of education: Not on file   Highest education level: Not on file  Occupational History   Occupation: para psychologist, forensic, part time cook  Tobacco Use   Smoking status: Former    Current packs/day: 1.00    Average packs/day: 1 pack/day for 4.0 years (4.0 ttl pk-yrs)    Types: Cigarettes   Smokeless tobacco: Never  Vaping Use   Vaping status: Never Used  Substance and Sexual Activity   Alcohol use: Not Currently    Alcohol/week: 1.0 standard drink of alcohol    Types: 1 Glasses of wine per week    Comment: I do  have my wine every day, has cut down from 2 bottles at a time, currently 2-3 glasses   Drug use: No   Sexual activity: Not Currently  Other Topics Concern   Not on file  Social History Narrative   Lives by self   Social Drivers of Health   Financial Resource Strain: Low Risk  (12/03/2021)   Overall Financial Resource Strain (CARDIA)    Difficulty of Paying Living Expenses: Not very hard  Food Insecurity: No Food Insecurity (10/11/2023)   Hunger Vital Sign    Worried About Running Out of Food in the Last Year: Never true    Ran Out of Food in the Last Year: Never true  Transportation Needs: No Transportation Needs (10/11/2023)   PRAPARE - Administrator, Civil Service (Medical): No    Lack of Transportation (Non-Medical): No  Physical Activity: Not on file  Stress: Not on file  Social Connections: Unknown (10/11/2023)   Social Connection and Isolation Panel    Frequency of Communication with Friends and Family: Once a week    Frequency of Social  Gatherings with Friends and Family: Once a week    Attends Religious Services: 1 to 4 times per year    Active Member of [employer] or Organizations: No    Attends Engineer, Structural: 1 to 4 times per year    Marital Status: Patient declined     Family History: The patient's ***family history includes Coronary artery disease in an other family member; Diabetes in an other family member; Emphysema in her father; Heart disease in her mother; Ovarian cancer in her mother. There is no history of Cancer.  ROS:   Please see the history of present illness.    *** All other systems reviewed and are negative.  EKGs/Labs/Other Studies Reviewed:    The following studies were reviewed today: ***      Recent Labs: 12/28/2022: TSH 0.626 05/11/2023: B Natriuretic Peptide 551.0 10/12/2023: Pro Brain Natriuretic Peptide 4,823.0 11/13/2023: ALT 51; BUN 22; Creatinine, Ser 1.10; Hemoglobin 12.6; Hemoglobin 12.9; Magnesium  1.8;  Platelets 214; Potassium 4.4; Potassium 4.5; Sodium 137; Sodium 138  Recent Lipid Panel    Component Value Date/Time   CHOL 178 10/14/2023 0514   TRIG 116 10/14/2023 0514   HDL 61 10/14/2023 0514   CHOLHDL 2.9 10/14/2023 0514   VLDL 23 10/14/2023 0514   LDLCALC 94 10/14/2023 0514   LDLDIRECT 121.8 05/26/2011 0928     Risk Assessment/Calculations:   {Does this patient have ATRIAL FIBRILLATION?:[phone]}            Physical Exam:    VS:  There were no vitals taken for this visit.    Wt Readings from Last 3 Encounters:  11/13/23 180 lb (81.6 kg)  10/15/23 174 lb 2.6 oz (79 kg)  05/13/23 174 lb 9.6 oz (79.2 kg)     GEN: *** Well nourished, well developed in no acute distress HEENT: Normal NECK: No JVD; No carotid bruits LYMPHATICS: No lymphadenopathy CARDIAC: ***RRR, no murmurs, rubs, gallops RESPIRATORY:  Clear to auscultation without rales, wheezing or rhonchi  ABDOMEN: Soft, non-tender, non-distended MUSCULOSKELETAL:  No edema; No deformity  SKIN: Warm and dry NEUROLOGIC:  Alert and oriented x 3 PSYCHIATRIC:  Normal affect   ASSESSMENT:    No diagnosis found. PLAN:    In order of problems listed above:  ***      {Are you ordering a CV Procedure (e.g. stress test, cath, DCCV, TEE, etc)?   Press F2        :[id number]}    Medication Adjustments/Labs and Tests Ordered: Current medicines are reviewed at length with the patient today.  Concerns regarding medicines are outlined above.  No orders of the defined types were placed in this encounter.  No orders of the defined types were placed in this encounter.   There are no Patient Instructions on file for this visit.   Signed, Jon Nat Hails, PA  11/13/2023 10:44 AM    [facility]

## 2023-11-13 NOTE — ED Triage Notes (Signed)
 PT BIB [facility] coming from home c/o SHOB/vaginal bleeding. EMS states pt diagnosis 2 days ago w/ yeast infection. Upon ems arrival pt tachypnea 22, Afib rvr 120-160. EMS reports edema throughout , weeping edema r leg   Cap 20  136/87 CBG 124 18g r ac 100 LR given.

## 2023-11-13 NOTE — ED Provider Notes (Signed)
 [place] EMERGENCY DEPARTMENT AT [facility] Provider Note   CSN: [id number] Arrival date & time: 11/13/23  9176     Patient presents with: Vaginal Bleeding, sepsis, and UTI   Christine Jacobson is a 68 y.o. female.   HPI Patient presents for bleeding from genital area.  Medical history includes atrial fibrillation, CHF, anxiety, depression, anemia, HTN, HLD.  She developed vaginal irritation 2 days ago.  She was prescribed an antifungal medication yesterday.  Today, she had worsened irritation and bleeding.  EMS was called.  EMS noted tachypnea and atrial fibrillation with RVR.  Patient endorses shortness of breath, pain and irritation to vaginal area, as well as a painful wound on the posterior aspect of her distal right lower extremity.    Prior to Admission medications   Medication Sig Start Date End Date Taking? Authorizing Provider  acetaminophen  (TYLENOL ) 325 MG tablet Take 2 tablets (650 mg total) by mouth every 6 (six) hours as needed for mild pain (pain score 1-3) or fever (or Fever >/= 101). 10/14/23   Sebastian Toribio GAILS, MD  allopurinol  (ZYLOPRIM ) 100 MG tablet Take 1 tablet (100 mg total) by mouth daily. 10/14/23   Sebastian Toribio GAILS, MD  apixaban  (ELIQUIS ) 5 MG TABS tablet Take 1 tablet (5 mg total) by mouth 2 (two) times daily. 06/16/23   Tobb, Kardie, DO  ascorbic acid  (VITAMIN C ) 500 MG tablet Take 1 tablet (500 mg total) by mouth daily for 30 days 06/10/22   [name], MD  atorvastatin  (LIPITOR) 80 MG tablet Take 1 tablet (80 mg total) by mouth daily. 10/14/23   Sebastian Toribio GAILS, MD  empagliflozin  (JARDIANCE ) 10 MG TABS tablet Take 1 tablet (10 mg total) by mouth daily. 10/15/23   Sebastian Toribio GAILS, MD  escitalopram  (LEXAPRO ) 5 MG tablet Take 1 tablet (5 mg total) by mouth in the morning for moods. 10/30/23     ezetimibe  (ZETIA ) 10 MG tablet Take 1 tablet (10 mg total) by mouth daily for cholesterol. 10/30/23     fluconazole (DIFLUCAN) 150 MG tablet  Take 1 tablet (150 mg total) by mouth now,may repeat in 72 hours if needed and symptoms persist. 11/12/23     furosemide  (LASIX ) 40 MG tablet Take 1 tablet (40 mg total) by mouth in the morning. 10/14/23   Sebastian Toribio GAILS, MD  metoprolol  succinate (TOPROL -XL) 25 MG 24 hr tablet Take 1 tablet (25 mg total) by mouth daily. 10/14/23   Sebastian Toribio GAILS, MD  pantoprazole  (PROTONIX ) 40 MG tablet Take 1 tablet (40 mg total) by mouth daily. 10/14/23   Sebastian Toribio GAILS, MD  Potassium Chloride  ER 20 MEQ TBCR Take 1 tablet (20 mEq total) by mouth every other day. Patient taking differently: Take 20 mEq by mouth in the morning. 05/31/23   Tobb, Kardie, DO  Zinc  Sulfate 220 (50 Zn) MG TABS Take 1 tablet (220 mg total) by mouth daily for 30 days 06/10/22   [name], MD  QUEtiapine  (SEROQUEL ) 25 MG tablet Take 0.5 tablets (12.5 mg total) by mouth at bedtime for 7 days, THEN 1 tablet (25 mg total) at bedtime for mood and insomnia. Patient not taking: Reported on 05/11/2023 04/20/23 07/13/23      Allergies: Porcine (pork) protein-containing drug products    Review of Systems  Respiratory:  Positive for shortness of breath.   Genitourinary:  Positive for vaginal bleeding, vaginal discharge and vaginal pain.  Skin:  Positive for wound.  All other systems reviewed  and are negative.   Updated Vital Signs BP 118/86   Pulse (!) 124   Temp 99.1 F (37.3 C) (Rectal)   Resp (!) 25   Ht 5' 4 (1.626 m)   Wt 81.6 kg   SpO2 97%   BMI 30.90 kg/m   Physical Exam Vitals and nursing note reviewed. Exam conducted with a chaperone present.  Constitutional:      General: She is not in acute distress.    Appearance: Normal appearance. She is well-developed. She is ill-appearing. She is not toxic-appearing or diaphoretic.  HENT:     Head: Normocephalic and atraumatic.     Right Ear: External ear normal.     Left Ear: External ear normal.     Nose: Nose normal.  Eyes:     Extraocular Movements:  Extraocular movements intact.     Conjunctiva/sclera: Conjunctivae normal.  Cardiovascular:     Rate and Rhythm: Normal rate and regular rhythm.  Pulmonary:     Effort: Pulmonary effort is normal. No respiratory distress.  Abdominal:     General: There is no distension.     Palpations: Abdomen is soft.     Tenderness: There is no abdominal tenderness.  Genitourinary:    Comments: Erythematous skin to proximal medial thighs and perineum.  Foul-smelling, bloody, purulent discharge present. Musculoskeletal:        General: No swelling.     Cervical back: Normal range of motion and neck supple.  Skin:    General: Skin is warm and dry.     Coloration: Skin is jaundiced. Skin is not pale.  Neurological:     General: No focal deficit present.     Mental Status: She is alert and oriented to person, place, and time.  Psychiatric:        Mood and Affect: Mood normal.        Behavior: Behavior normal.     (all labs ordered are listed, but only abnormal results are displayed) Labs Reviewed  COMPREHENSIVE METABOLIC PANEL WITH GFR - Abnormal; Notable for the following components:      Result Value   CO2 16 (*)    Creatinine, Ser 1.08 (*)    Total Protein 6.2 (*)    Albumin  3.0 (*)    AST 46 (*)    ALT 51 (*)    Alkaline Phosphatase 216 (*)    GFR, Estimated 55 (*)    All other components within normal limits  CBC WITH DIFFERENTIAL/PLATELET - Abnormal; Notable for the following components:   WBC 15.2 (*)    RDW 17.0 (*)    Neutro Abs 13.1 (*)    Abs Immature Granulocytes 0.11 (*)    All other components within normal limits  BRAIN NATRIURETIC PEPTIDE - Abnormal; Notable for the following components:   B Natriuretic Peptide 915.1 (*)    All other components within normal limits  I-STAT CG4 LACTIC ACID, ED - Abnormal; Notable for the following components:   Lactic Acid, Venous 3.8 (*)    All other components within normal limits  I-STAT VENOUS BLOOD GAS, ED - Abnormal; Notable for  the following components:   pCO2, Ven 27.1 (*)    pO2, Ven 93 (*)    Bicarbonate 14.2 (*)    TCO2 15 (*)    Acid-base deficit 10.0 (*)    Calcium , Ion 1.13 (*)    All other components within normal limits  I-STAT CHEM 8, ED - Abnormal; Notable for the following components:  Chloride 113 (*)    Creatinine, Ser 1.10 (*)    Calcium , Ion 1.13 (*)    TCO2 15 (*)    All other components within normal limits  I-STAT CG4 LACTIC ACID, ED - Abnormal; Notable for the following components:   Lactic Acid, Venous 2.2 (*)    All other components within normal limits  RESP PANEL BY RT-PCR (RSV, FLU A&B, COVID)  RVPGX2  CULTURE, BLOOD (ROUTINE X 2)  CULTURE, BLOOD (ROUTINE X 2)  PROTIME-INR  MAGNESIUM   TSH  URINALYSIS, W/ REFLEX TO CULTURE (INFECTION SUSPECTED)  CA 125  HIV ANTIBODY (ROUTINE TESTING W REFLEX)  GC/CHLAMYDIA PROBE AMP ([place]) NOT AT [facility]    EKG: EKG Interpretation Date/Time:  Monday November 13 2023 08:32:50 EDT Ventricular Rate:  123 PR Interval:    QRS Duration:  97 QT Interval:  315 QTC Calculation: 451 R Axis:   -4  Text Interpretation: Atrial fibrillation LVH with secondary repolarization abnormality Confirmed by Melvenia Motto ([id number]) on 11/13/2023 10:14:06 AM  Radiology: CT CHEST ABDOMEN PELVIS W CONTRAST Result Date: 11/13/2023 CLINICAL DATA:  Sepsis * Tracking Code: BO * EXAM: CT CHEST, ABDOMEN, AND PELVIS WITH CONTRAST TECHNIQUE: Multidetector CT imaging of the chest, abdomen and pelvis was performed following the standard protocol during bolus administration of intravenous contrast. RADIATION DOSE REDUCTION: This exam was performed according to the departmental dose-optimization program which includes automated exposure control, adjustment of the mA and/or kV according to patient size and/or use of iterative reconstruction technique. CONTRAST:  75mL OMNIPAQUE  IOHEXOL  350 MG/ML SOLN COMPARISON:  CT chest angiogram, 05/10/2023 FINDINGS: CT CHEST FINDINGS  Cardiovascular: Scattered aortic atherosclerosis. Cardiomegaly. Three-vessel coronary artery calcifications. No pericardial effusion. Mediastinum/Nodes: Unchanged prominent mediastinal lymph nodes, high right paratracheal node measuring 1.6 x 0.8 cm (series 3, image 13). Heterogeneous nodular thyroid and isthmus (series 3, image 12). Trachea, and esophagus demonstrate no significant findings. Lungs/Pleura: Diffuse interlobular septal thickening, mosaic attenuation of the airspaces, and diffuse bilateral bronchial wall thickening. Small benign calcified nodule of the medial right lung base requiring no further follow-up or characterization (series 5, image 110). No pleural effusion or pneumothorax. Musculoskeletal: No chest wall abnormality. No acute osseous findings. CT ABDOMEN PELVIS FINDINGS Hepatobiliary: No solid liver abnormality is seen. No gallstones, gallbladder wall thickening, or biliary dilatation. Pancreas: Unremarkable. No pancreatic ductal dilatation or surrounding inflammatory changes. Spleen: Normal in size without significant abnormality. Adrenals/Urinary Tract: Adrenal glands are unremarkable. Kidneys are normal, without renal calculi, solid lesion, or hydronephrosis. Bladder is unremarkable. Stomach/Bowel: Stomach is within normal limits. Appendix appears normal. No evidence of bowel wall thickening, distention, or inflammatory changes. Vascular/Lymphatic: Aortic atherosclerosis. No enlarged abdominal or pelvic lymph nodes. Reproductive: Large cystic lesions of the ovaries, on the right, situated in the right lower quadrant measuring 12.2 x 8.1 x 8.4 cm (series 3, image 93) and arising from the left ovary situated in the posterior pelvis with a visible internal septation measuring 9.0 x 7.9 x 6.6 cm (series 3, image 100). Other: No abdominal wall hernia or abnormality. No ascites. Musculoskeletal: No acute osseous findings. IMPRESSION: 1. Diffuse interlobular septal thickening, mosaic attenuation  of the airspaces, and diffuse bilateral bronchial wall thickening, consistent with pulmonary edema. 2. Cardiomegaly and coronary artery disease. 3. Large cystic lesions of the ovaries, on the right, situated in the right lower quadrant measuring 12.2 x 8.1 x 8.4 cm and arising from the left ovary situated in the posterior pelvis with a visible internal septation measuring 9.0 x 7.9 x 6.6  cm. 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 Recommend gynecologic referral and consideration of further evaluation with pelvic ultrasound and/or contrast enhanced pelvic MRI. 4. Unchanged prominent mediastinal lymph nodes, nonspecific and likely reactive. 5. Heterogeneous nodular thyroid and isthmus. Recommend dedicated thyroid ultrasound on a nonemergent, outpatient basis if and when clinically appropriate. Aortic Atherosclerosis (ICD10-I70.0). Electronically Signed   By: Marolyn JONETTA Jaksch M.D.   On: 11/13/2023 13:41   DG Chest Port 1 View Result Date: 11/13/2023 EXAM: 1 VIEW(S) XRAY OF THE CHEST 11/13/2023 08:47:00 AM COMPARISON: 10/11/2023 CLINICAL HISTORY: Reason for exam: questionable sepsis; SHOB/vaginal bleeding. Upon ems arrival pt tachypnea 22, Afib rvr 120-160. EMS reports edema throughout , weeping edema r leg. FINDINGS: LUNGS AND PLEURA: No focal pulmonary opacity. No pulmonary edema. No pleural effusion. No pneumothorax. HEART AND MEDIASTINUM: Stable cardiomegaly. BONES AND SOFT TISSUES: No acute osseous abnormality. IMPRESSION: 1. Stable cardiomegaly. 2. No acute lung disease. Electronically signed by: Norleen Kil MD 11/13/2023 09:18 AM EDT RP Workstation: [id number]     Procedures   Medications Ordered in the ED  fentaNYL  (SUBLIMAZE ) injection 100 mcg (0 mcg Intravenous Hold 11/13/23 0911)  fentaNYL  (SUBLIMAZE ) injection 50 mcg (50 mcg Intravenous Given 11/13/23 1321)  norepinephrine (LEVOPHED) 4mg  in (0.016 mg/mL) premix infusion (has no administration in time range)  amiodarone (NEXTERONE)  1.8 mg/mL load via infusion 150 mg (150 mg Intravenous Bolus from Bag 11/13/23 1424)    Followed by  amiodarone (NEXTERONE PREMIX) 360-4.14 MG/200ML-% (1.8 mg/mL) IV infusion (60 mg/hr Intravenous New Bag/Given 11/13/23 1425)    Followed by  amiodarone (NEXTERONE PREMIX) 360-4.14 MG/200ML-% (1.8 mg/mL) IV infusion (has no administration in time range)  valACYclovir (VALTREX) tablet 1,000 mg (has no administration in time range)  barrier cream (non-specified) 1 Application (has no administration in time range)  lactated ringers  bolus 1,000 mL (0 mLs Intravenous Stopped 11/13/23 1051)  ceFEPIme  (MAXIPIME ) 2 g in sodium chloride  0.9 % 100 mL IVPB (0 g Intravenous Stopped 11/13/23 1051)  metroNIDAZOLE (FLAGYL) IVPB 500 mg (0 mg Intravenous Stopped 11/13/23 1051)  vancomycin  (VANCOCIN ) IVPB 1000 mg/200 mL premix (0 mg Intravenous Stopped 11/13/23 1222)  lactated ringers  bolus 500 mL (0 mLs Intravenous Stopped 11/13/23 1222)  iohexol  (OMNIPAQUE ) 350 MG/ML injection 75 mL (75 mLs Intravenous Contrast Given 11/13/23 1211)                                    Medical Decision Making Amount and/or Complexity of Data Reviewed Labs: ordered. Radiology: ordered.  Risk Prescription drug management.   This patient presents to the ED for concern of vaginal bleeding, this involves an extensive number of treatment options, and is a complaint that carries with it a high risk of complications and morbidity.  The differential diagnosis includes infection, neoplasm, occult trauma   Co morbidities / Chronic conditions that complicate the patient evaluation  atrial fibrillation, CHF, anxiety, depression, anemia, HTN, HLD   Additional history obtained:  Additional history obtained from EMR External records from outside source obtained and reviewed including EMS   Lab Tests:  I Ordered, and personally interpreted labs.  The pertinent results include: Leukocytosis and lactic acidosis are present  consistent with sepsis; none anion gap metabolic acidosis present.  Creatinine is baseline.   Imaging Studies ordered:  I ordered imaging studies including x-ray of chest and right tip/fib; CT of chest, abdomen, pelvis I independently visualized and interpreted imaging which showed CT  scan shows findings consistent with pulmonary edema; large cystic lesions of ovaries concerning for neoplasms I agree with the radiologist interpretation   Cardiac Monitoring: / EKG:  The patient was maintained on a cardiac monitor.  I personally viewed and interpreted the cardiac monitored which showed an underlying rhythm of: Atrial fibrillation   Problem List / ED Course / Critical interventions / Medication management  Patient presenting for 2 days of vaginal irritation, progressing to pain and bleeding.  On arrival in the ED, she is tachycardic and tachypneic.  She has foul-smelling, bloody and purulent discharge in area of perineum.  She also complains of pain of a wound on the posterior aspect of her distal right leg.  Patient does meet sepsis criteria.  Septic workup and treatment were initiated.  Given location of infectious source, there is concern of Fournier's gangrene.  Will obtain CT imaging.  Fentanyl  was ordered for analgesia.  Patient's lab work notable for leukocytosis and lactic acidosis consistent with sepsis.  She had sustained atrial fibrillation with RVR.  Blood pressures are now soft and.  Amiodarone was ordered.  On CT imaging, patient has large cystic lesions of ovaries concerning for neoplasms.  I spoke with gynecologist on-call who will see in consult.  He requested a CA125 lab test.  This was ordered.  Patient was admitted for further management. I ordered medication including IV fluids and broad-spectrum antibiotics for empiric treatment of sepsis; fentanyl  for analgesia; amiodarone for atrial fibrillation with RVR Reevaluation of the patient after these medicines showed that the patient  improved I have reviewed the patients home medicines and have made adjustments as needed   Consultations Obtained:  I requested consultation with the gynecologist, Dr. Izell,  and discussed lab and imaging findings as well as pertinent plan - they recommend: Will see in consult   Social Determinants of Health:  Has PCP      Final diagnoses:  Vaginal bleeding  Sepsis, due to unspecified organism, unspecified whether acute organ dysfunction present [facility])  Atrial fibrillation with RVR (HCC)  Cellulitis of groin  Cellulitis of right lower extremity    ED Discharge Orders     None          Melvenia Motto, MD 11/13/23 1438

## 2023-11-13 NOTE — ED Notes (Signed)
 X RAY at bedside

## 2023-11-13 NOTE — ED Notes (Signed)
 Patient transported to CT

## 2023-11-13 NOTE — Progress Notes (Signed)
 Pharmacy Antibiotic Note  Christine Jacobson is a 68 y.o. female for which pharmacy has been consulted for cefepime  and vancomycin  dosing for sepsis.  Patient with a history of HFpEF, HTN, HLD, obesity. Patient presenting with AF and c/f urinary sourced sepsis.  SCr 1.1 WBC 15.2; LA 2.2; T 97.9; HR 52; RR 23 COVID neg / flu neg  Plan: Cefepime  2g q12hr  Vancomycin  1000 mg given once in the ED --Will start vancomycin  750 mg q24hr (eAUC 477.4; Vd 0.5 for BMI > 30) unless change in renal function Monitor WBC, fever, renal function, cultures De-escalate when able Levels at steady state  Height: 5' 4 (162.6 cm) Weight: 81.6 kg (180 lb) IBW/kg (Calculated) : 54.7  Temp (24hrs), Avg:98.4 F (36.9 C), Min:97.9 F (36.6 C), Max:99.1 F (37.3 C)  Recent Labs  Lab 11/13/23 0920 11/13/23 0921 11/13/23 0922 11/13/23 1107  WBC 15.2*  --   --   --   CREATININE 1.08* 1.10*  --   --   LATICACIDVEN  --   --  3.8* 2.2*    Estimated Creatinine Clearance: 49.2 mL/min (A) (by C-G formula based on SCr of 1.1 mg/dL (H)).    Allergies  Allergen Reactions   Porcine (Pork) Protein-Containing Drug Products Other (See Comments)    Religious preference    Microbiology results: Pending  Thank you for allowing pharmacy to be a part of this patient's care.  Dorn Buttner, PharmD, BCPS 11/13/2023 4:02 PM ED Clinical Pharmacist -  [phone]

## 2023-11-13 NOTE — H&P (Addendum)
 History and Physical    Patient: Christine Jacobson FMW:[id number] DOB: 08/07/1955 DOA: 11/13/2023 DOS: the patient was seen and examined on 11/13/2023 PCP: [name], NP  Patient coming from: Home  Chief Complaint:  Chief Complaint  Patient presents with   Vaginal Bleeding   sepsis   UTI   HPI: Christine Jacobson is a 68 y.o. female with medical history significant of HFpEF (EF >75% in 09/2023; recent admission to [facility] last month for ADHF requiring IV diruesis), HTN, HLD, and obesity who p/w Afib iso sepsis of presumed urinary origin c/b volume overload 2/2 HFpEF exacerbation.  Pt is a very poor historian and reports that she presented to the ED after being advised to do so after calling her providers at [facility] and reporting a severe vaginal yeast infection. Of note, pt recently treated for presumed vaginal yeast infection and started antifungal medication (fluconazole) without relief; in fact, she endorsed worsening irritation and vaginal bleeding since starting this medication, which prompted her to seek medical attention.  In the ED, pt tachycardic and tachypneic on RA. Labs notable for BNP 915, WBC 15, and lactic acid 3.8-->2.2. CT chest/abdomen/pelvis showed pulmonary edema and ovarian neoplasms. EDP started amiodarone gtt, consulted GYN, and requested medicine admission.   Review of Systems: As mentioned in the history of present illness. All other systems reviewed and are negative. 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   2D ECHO - EF >55%, normal   HTN (hypertension)    Hyperprolactinemia    dx in her 18, took meds, self d/c a while back   Liver function test abnormality    normal when repeated   Mild hyperlipidemia    Obesity     Palpitations    negative stress echo in November 2012 with normal LV function; mild LVH, proximal septal thickening with narrow LVOT and mild gradient; mild MR and TR; Cardionet showed PACs in November 2012   Pyoderma gangrenosa (HCC)    Shortness of breath 07/11/2011   MET TEST   Past Surgical History:  Procedure Laterality Date   BIOPSY  08/10/2022   Procedure: BIOPSY;  Surgeon: Burnette Fallow, MD;  Location: [facility];  Service: Gastroenterology;;   CARDIOVERSION N/A 02/01/2022   Procedure: CARDIOVERSION;  Surgeon: Sheena Pugh, DO;  Location: [facility];  Service: Cardiovascular;  Laterality: N/A;   CARDIOVERSION N/A 09/20/2022   Procedure: CARDIOVERSION;  Surgeon: Cherrie Toribio SAUNDERS, MD;  Location: [facility];  Service: Cardiovascular;  Laterality: N/A;   COLONOSCOPY WITH PROPOFOL  Left 08/10/2022   Procedure: COLONOSCOPY WITH PROPOFOL ;  Surgeon: Burnette Fallow, MD;  Location: [facility];  Service: Gastroenterology;  Laterality: Left;   ESOPHAGOGASTRODUODENOSCOPY (EGD) WITH PROPOFOL  Left 08/10/2022   Procedure: ESOPHAGOGASTRODUODENOSCOPY (EGD) WITH PROPOFOL ;  Surgeon: Burnette Fallow, MD;  Location: [facility];  Service: Gastroenterology;  Laterality: Left;   POLYPECTOMY  08/10/2022   Procedure: POLYPECTOMY;  Surgeon: Burnette Fallow, MD;  Location: [facility];  Service: Gastroenterology;;   RIGHT HEART CATH N/A 10/15/2021   Procedure: RIGHT HEART CATH;  Surgeon: Elmira Newman PARAS, MD;  Location: [facility];  Service: Cardiovascular;  Laterality: N/A;   RIGHT/LEFT HEART CATH AND CORONARY ANGIOGRAPHY N/A 10/13/2023   Procedure: RIGHT/LEFT HEART CATH AND CORONARY ANGIOGRAPHY;  Surgeon: Rolan Ezra RAMAN, MD;  Location: [facility];  Service: Cardiovascular;  Laterality: N/A;   SKIN GRAFT  2006   porcine, R leg   TEE WITHOUT CARDIOVERSION N/A 09/20/2022   Procedure: TRANSESOPHAGEAL ECHOCARDIOGRAM;  Surgeon: Cherrie Toribio SAUNDERS, MD;  Location: [facility];  Service: Cardiovascular;  Laterality: N/A;   Social History:  reports that she has quit smoking. Her smoking use included cigarettes. She has a 4 pack-year smoking history. She has never used smokeless tobacco. She reports that she does not currently use alcohol after a past usage of about 1.0 standard drink of alcohol per week. She reports that she does not use drugs.  Allergies  Allergen Reactions   Porcine (Pork) Protein-Containing Drug Products Other (See Comments)    Religious preference    Family History  Problem Relation Age of Onset   Heart disease Mother        endocarditis   Ovarian cancer Mother    Emphysema Father    Coronary artery disease Other        F in his 59   Diabetes Other        GP   Cancer Neg Hx     Prior to Admission medications   Medication Sig Start Date End Date Taking? Authorizing Provider  acetaminophen  (TYLENOL ) 325 MG tablet Take 2 tablets (650 mg total) by mouth every 6 (six) hours as needed for mild pain (pain score 1-3) or fever (or Fever >/= 101). 10/14/23   Sebastian Toribio GAILS, MD  allopurinol  (ZYLOPRIM ) 100 MG tablet Take 1 tablet (100 mg total) by mouth daily. 10/14/23   Sebastian Toribio GAILS, MD  apixaban  (ELIQUIS ) 5 MG TABS tablet Take 1 tablet (5 mg total) by mouth 2 (two) times daily. 06/16/23   Tobb, Kardie, DO  ascorbic acid  (VITAMIN C ) 500 MG tablet Take 1 tablet (500 mg total) by mouth daily for 30 days 06/10/22   [name], MD  atorvastatin  (LIPITOR) 80 MG tablet Take 1 tablet (80 mg total) by mouth daily. 10/14/23   Sebastian Toribio GAILS, MD  empagliflozin  (JARDIANCE ) 10 MG TABS tablet Take 1 tablet (10 mg total) by mouth daily. 10/15/23   Sebastian Toribio GAILS, MD  escitalopram  (LEXAPRO ) 5 MG tablet Take 1 tablet (5 mg total) by mouth in the morning for moods. 10/30/23     ezetimibe  (ZETIA ) 10 MG tablet Take 1 tablet (10 mg total) by mouth daily for cholesterol. 10/30/23     fluconazole (DIFLUCAN) 150 MG tablet Take 1 tablet (150 mg  total) by mouth now,may repeat in 72 hours if needed and symptoms persist. 11/12/23     furosemide  (LASIX ) 40 MG tablet Take 1 tablet (40 mg total) by mouth in the morning. 10/14/23   Sebastian Toribio GAILS, MD  metoprolol  succinate (TOPROL -XL) 25 MG 24 hr tablet Take 1 tablet (25 mg total) by mouth daily. 10/14/23   Sebastian Toribio GAILS, MD  pantoprazole  (PROTONIX ) 40 MG tablet Take 1 tablet (40 mg total) by mouth daily. 10/14/23   Sebastian Toribio GAILS, MD  Potassium Chloride  ER 20 MEQ TBCR Take 1 tablet (20 mEq total) by mouth every other day. Patient taking differently: Take 20 mEq by mouth in the morning. 05/31/23   Tobb, Kardie, DO  Zinc  Sulfate 220 (50 Zn) MG TABS Take 1 tablet (220 mg total) by mouth daily for 30 days 06/10/22   [name], MD  QUEtiapine  (SEROQUEL ) 25 MG tablet Take 0.5 tablets (12.5 mg total) by mouth at bedtime  for 7 days, THEN 1 tablet (25 mg total) at bedtime for mood and insomnia. Patient not taking: Reported on 05/11/2023 04/20/23 07/13/23      Physical Exam: Vitals:   11/13/23 1445 11/13/23 1456 11/13/23 1500 11/13/23 1530  BP: 115/73  120/70 (!) 123/99  Pulse: (!) 101  95 (!) 52  Resp: (!) 21  17 (!) 23  Temp:  97.9 F (36.6 C)    TempSrc:  Oral    SpO2: 97%  96% 99%  Weight:      Height:       General: Alert, oriented x3, resting comfortably in no acute distress HEENT: EOMI, oropharynx clear, moist mucous membranes, hearing intact Neck: Trachea midline and no gross thyromegaly Respiratory: Lungs clear to auscultation bilaterally with normal respiratory effort; no w/r/r Cardiovascular: Regular rate and rhythm w/o m/r/g Abdomen: Soft, nontender, nondistended. Positive bowel sounds MSK: No obvious joint deformities or swelling Skin: No obvious rashes or lesions Neurologic: Awake, alert, spontaneously moves all extremities, strength intact Psychiatric: Appropriate mood and affect, conversational and cooperative   Data Reviewed:  Lab Results  Component Value  Date   WBC 15.2 (H) 11/13/2023   HGB 12.6 11/13/2023   HGB 12.9 11/13/2023   HCT 37.0 11/13/2023   HCT 38.0 11/13/2023   MCV 93.6 11/13/2023   PLT 214 11/13/2023   Lab Results  Component Value Date   GLUCOSE 97 11/13/2023   CALCIUM  8.9 11/13/2023   NA 137 11/13/2023   NA 138 11/13/2023   K 4.4 11/13/2023   K 4.5 11/13/2023   CO2 16 (L) 11/13/2023   CL 113 (H) 11/13/2023   BUN 22 11/13/2023   CREATININE 1.10 (H) 11/13/2023   Lab Results  Component Value Date   ALT 51 (H) 11/13/2023   AST 46 (H) 11/13/2023   ALKPHOS 216 (H) 11/13/2023   BILITOT 1.0 11/13/2023   Lab Results  Component Value Date   INR 1.0 11/13/2023   INR 1.0 08/09/2022   INR 2.7 (H) 08/04/2022   Radiology: DG Tibia/Fibula Right Result Date: 11/13/2023 EXAM: _VIEWS_ VIEW(S) XRAY OF THE _LATERALITY_ TIBIA AND FIBULA 11/13/2023 02:35:48 PM COMPARISON: None available. CLINICAL HISTORY: wound FINDINGS: BONES AND JOINTS: No acute fracture. No focal osseous lesion. No joint dislocation. SOFT TISSUES: Diffuse subcutaneous soft tissue edema. IMPRESSION: 1. Diffuse subcutaneous soft tissue edema. 2. No acute osseous findings. Electronically signed by: Waddell Calk MD 11/13/2023 03:37 PM EDT RP Workstation: [id number]   CT CHEST ABDOMEN PELVIS W CONTRAST Result Date: 11/13/2023 CLINICAL DATA:  Sepsis * Tracking Code: BO * EXAM: CT CHEST, ABDOMEN, AND PELVIS WITH CONTRAST TECHNIQUE: Multidetector CT imaging of the chest, abdomen and pelvis was performed following the standard protocol during bolus administration of intravenous contrast. RADIATION DOSE REDUCTION: This exam was performed according to the departmental dose-optimization program which includes automated exposure control, adjustment of the mA and/or kV according to patient size and/or use of iterative reconstruction technique. CONTRAST:  75mL OMNIPAQUE  IOHEXOL  350 MG/ML SOLN COMPARISON:  CT chest angiogram, 05/10/2023 FINDINGS: CT CHEST FINDINGS  Cardiovascular: Scattered aortic atherosclerosis. Cardiomegaly. Three-vessel coronary artery calcifications. No pericardial effusion. Mediastinum/Nodes: Unchanged prominent mediastinal lymph nodes, high right paratracheal node measuring 1.6 x 0.8 cm (series 3, image 13). Heterogeneous nodular thyroid and isthmus (series 3, image 12). Trachea, and esophagus demonstrate no significant findings. Lungs/Pleura: Diffuse interlobular septal thickening, mosaic attenuation of the airspaces, and diffuse bilateral bronchial wall thickening. Small benign calcified nodule of the medial right lung base requiring no further follow-up or  characterization (series 5, image 110). No pleural effusion or pneumothorax. Musculoskeletal: No chest wall abnormality. No acute osseous findings. CT ABDOMEN PELVIS FINDINGS Hepatobiliary: No solid liver abnormality is seen. No gallstones, gallbladder wall thickening, or biliary dilatation. Pancreas: Unremarkable. No pancreatic ductal dilatation or surrounding inflammatory changes. Spleen: Normal in size without significant abnormality. Adrenals/Urinary Tract: Adrenal glands are unremarkable. Kidneys are normal, without renal calculi, solid lesion, or hydronephrosis. Bladder is unremarkable. Stomach/Bowel: Stomach is within normal limits. Appendix appears normal. No evidence of bowel wall thickening, distention, or inflammatory changes. Vascular/Lymphatic: Aortic atherosclerosis. No enlarged abdominal or pelvic lymph nodes. Reproductive: Large cystic lesions of the ovaries, on the right, situated in the right lower quadrant measuring 12.2 x 8.1 x 8.4 cm (series 3, image 93) and arising from the left ovary situated in the posterior pelvis with a visible internal septation measuring 9.0 x 7.9 x 6.6 cm (series 3, image 100). Other: No abdominal wall hernia or abnormality. No ascites. Musculoskeletal: No acute osseous findings. IMPRESSION: 1. Diffuse interlobular septal thickening, mosaic attenuation  of the airspaces, and diffuse bilateral bronchial wall thickening, consistent with pulmonary edema. 2. Cardiomegaly and coronary artery disease. 3. Large cystic lesions of the ovaries, on the right, situated in the right lower quadrant measuring 12.2 x 8.1 x 8.4 cm and arising from the left ovary situated in the posterior pelvis with a visible internal septation measuring 9.0 x 7.9 x 6.6 cm. 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 Recommend gynecologic referral and consideration of further evaluation with pelvic ultrasound and/or contrast enhanced pelvic MRI. 4. Unchanged prominent mediastinal lymph nodes, nonspecific and likely reactive. 5. Heterogeneous nodular thyroid and isthmus. Recommend dedicated thyroid ultrasound on a nonemergent, outpatient basis if and when clinically appropriate. Aortic Atherosclerosis (ICD10-I70.0). Electronically Signed   By: Marolyn JONETTA Jaksch M.D.   On: 11/13/2023 13:41   DG Chest Port 1 View Result Date: 11/13/2023 EXAM: 1 VIEW(S) XRAY OF THE CHEST 11/13/2023 08:47:00 AM COMPARISON: 10/11/2023 CLINICAL HISTORY: Reason for exam: questionable sepsis; SHOB/vaginal bleeding. Upon ems arrival pt tachypnea 22, Afib rvr 120-160. EMS reports edema throughout , weeping edema r leg. FINDINGS: LUNGS AND PLEURA: No focal pulmonary opacity. No pulmonary edema. No pleural effusion. No pneumothorax. HEART AND MEDIASTINUM: Stable cardiomegaly. BONES AND SOFT TISSUES: No acute osseous abnormality. IMPRESSION: 1. Stable cardiomegaly. 2. No acute lung disease. Electronically signed by: Norleen Kil MD 11/13/2023 09:18 AM EDT RP Workstation: [id number]    Assessment and Plan: 55F h/o HFpEF (EF >75% in 09/2023; recent admission to [facility] last month for ADHF requiring IV diruesis), HTN, HLD, and obesity who p/w Afib iso sepsis of presumed urinary origin c/b volume overload 2/2 HFpEF exacerbation.  Sepsis of presumed urinary origin Elevated lactic acid -IV vancomycin  and cefepime  per pharmacy  protocol -MIVF: NS at 75cc/h for now -F/u urine and blood cultures  Afib -Cards consulted; apprec eval/recs -Continue amiodarone gtt for now -PTA Eliquis  5mg  BID for now -MIVF per above  HFpEF exacerbation Elevated BNP Recent admission for ADHF and need for IV diuresis -Will need IV diuresis prior to d/c once sepsis resolves; will defer for now  Ovarian mass c/f neoplasm -OB/GYN consulted; apprec eval/recs   Advance Care Planning:   Code Status: Prior   Consults: OB/GYN and Cards  Family Communication: N/A  Severity of Illness: The appropriate patient status for this patient is INPATIENT. Inpatient status is judged to be reasonable and necessary in order to provide the required intensity of service to ensure the patient's safety. The  patient's presenting symptoms, physical exam findings, and initial radiographic and laboratory data in the context of their chronic comorbidities is felt to place them at high risk for further clinical deterioration. Furthermore, it is not anticipated that the patient will be medically stable for discharge from the hospital within 2 midnights of admission.   * I certify that at the point of admission it is my clinical judgment that the patient will require inpatient hospital care spanning beyond 2 midnights from the point of admission due to high intensity of service, high risk for further deterioration and high frequency of surveillance required.*   ------- I spent 56 minutes reviewing previous notes, at the bedside counseling/discussing the treatment plan, and performing clinical documentation.  Author: Marsha Ada, MD 11/13/2023 3:39 PM  For on call review [email or web address].

## 2023-11-13 NOTE — ED Notes (Signed)
 Urine cup at bedside pt unable to go at this time.

## 2023-11-14 DIAGNOSIS — A419 Sepsis, unspecified organism: Secondary | ICD-10-CM

## 2023-11-14 LAB — MRSA NEXT GEN BY PCR, NASAL: MRSA by PCR Next Gen: NOT DETECTED

## 2023-11-14 LAB — CA 125: Cancer Antigen (CA) 125: 5.7 U/mL (ref 0.0–38.1)

## 2023-11-14 MED ORDER — NYSTATIN 100000 UNIT/GM EX OINT
TOPICAL_OINTMENT | Freq: Two times a day (BID) | CUTANEOUS | Status: DC
  Filled 2023-11-14: qty 15

## 2023-11-14 MED ORDER — FUROSEMIDE 40 MG PO TABS
40.0000 mg | ORAL_TABLET | Freq: Every day | ORAL | Status: DC
  Administered 2023-11-14: 40 mg via ORAL
  Filled 2023-11-14: qty 2

## 2023-11-14 MED ORDER — CHLORHEXIDINE GLUCONATE CLOTH 2 % EX PADS
6.0000 | MEDICATED_PAD | Freq: Every day | CUTANEOUS | Status: DC
  Administered 2023-11-14 – 2023-11-18 (×5): 6 via TOPICAL

## 2023-11-14 MED ORDER — METOPROLOL SUCCINATE ER 25 MG PO TB24
25.0000 mg | ORAL_TABLET | Freq: Every day | ORAL | Status: DC
  Administered 2023-11-14: 25 mg via ORAL
  Filled 2023-11-14: qty 1

## 2023-11-14 NOTE — TOC CM/SW Note (Signed)
 Transition of Care [facility]) - Inpatient Brief Assessment   Patient Details  Name: Jaiden Wahab MRN: [id number] Date of Birth: [birth year]  [facility] CM/SW Contact:    Lauraine FORBES Saa, LCSWA Phone Number: 11/14/2023, 3:40 PM   Clinical Narrative:  3:40 PM Per chart review, patient resides at home alone. Patient has a PCP and insurance. Patient does not have SNF history. Patient has HH history with [facility]. Patient has DME (BSC, RW) history with [facility]. Patient's preferred pharmacy is [facility]. No TOC needs identified at this time. TOC will continue to follow.  Transition of Care Asessment: Insurance and Status: Insurance coverage has been reviewed Patient has primary care physician: Yes Home environment has been reviewed: Private Residence Prior level of function:: N/A Prior/Current Home Services: No current home services Social Drivers of Health Review: SDOH reviewed no interventions necessary Readmission risk has been reviewed: Yes (Currently Yellow 17%) Transition of care needs: no transition of care needs at this time

## 2023-11-14 NOTE — Progress Notes (Signed)
 PROGRESS NOTE  Tenleigh Byer FMW:[id number] DOB: 10-22-1955 DOA: 11/13/2023 PCP: [name], NP   LOS: 1 day   Brief Narrative / Interim history: 68 year old female with history of HOCM, chronic diastolic CHF, HTN, HLD, who comes into the hospital with presumed sepsis of urinary origin as well as evidence of fluid overload.  She was recently hospitalized and discharged about a month ago, at that time came in with shortness of breath, swelling and was admitted to the hospital for IV diuresis.  Cardiology was consulted.  There is noted ongoing noncompliance with outpatient follow-up as well as medical therapy.  Patient is an extremely poor historian asking me to read the chart rather than ask her what is going on, but does tell me that she came to the hospital mainly for vaginal pain and irritation, feels like she has an infection that is not getting better.  OB/GYN was consulted on admission  Subjective / 24h Interval events: She is doing well this morning, she denies any shortness of breath, no cough or chest congestion.  She complains of vaginal pain/irritation which is the main reason why she is here.  Assesement and Plan: Principal problem Possible sepsis due to vulvar cellulitis -on admission she had low-grade temp of 99.1, A-fib with RVR showing heart rate of 124, elevated WBC and lactic acid.  She was placed on broad-spectrum antibiotics, continue -Blood cultures obtained and pending. -OB/GYN consulted, appreciate input.  Continue broad-spectrum antibiotics, placed on Valtrex also, and she is due for Diflucan 10/29 per OB/GYN  Active problems Acute on chronic diastolic CHF, HOCM -she has an elevated BNP, imaging on admission with a CT scan of the chest abdomen pelvis showed pulmonary edema, cardiomegaly.  She denies any respiratory symptoms whatsoever, denies any shortness of breath - She has chronic lower extremity swelling and chronic venous stasis changes.  Recent cardiac  cath 9/26 showed normal coronaries, normal filling pressures - Given history of HOCM, problem #1, would avoid aggressive diuresis at this point, resume home po furosemide .  Given reported nonadherence, it is possible that she was not taking it.  She herself states that she is taking her medications, initially told me when I needed, but could not elaborate and later on told me she takes them daily, as prescribed  P Afib with RVR-she came into the hospital with A-fib with RVR, started on amiodarone infusion.  Suspect her RVR was mainly driven by #1.  Placed on oral furosemide  to assist with extra fluid, resume home metoprolol  and discontinue amiodarone and closely watch on telemetry - Continue Eliquis    Ovarian mass c/f neoplasm -OB/GYN consulted; apprec eval/recs.  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  Scheduled Meds:  apixaban   5 mg Oral BID   atorvastatin   80 mg Oral Daily   escitalopram   5 mg Oral Daily   fentaNYL  (SUBLIMAZE ) injection  100 mcg Intravenous Once   pantoprazole   40 mg Oral Daily   valACYclovir  1,000 mg Oral BID   Continuous Infusions:  sodium chloride  75 mL/hr at 11/13/23 1651   amiodarone 30 mg/hr (11/14/23 0701)   ceFEPime  (MAXIPIME ) IV Stopped (11/13/23 2258)   vancomycin      PRN Meds:.barrier cream, fentaNYL  (SUBLIMAZE ) injection  Current Outpatient Medications  Medication Instructions   acetaminophen  (TYLENOL ) 650 mg, Oral, Every 6 hours PRN   allopurinol  (ZYLOPRIM ) 100 mg, Oral, Daily   apixaban  (ELIQUIS ) 5 mg, Oral, 2 times daily   ascorbic acid  (VITAMIN C )  500 MG tablet Take 1 tablet (500 mg total) by mouth daily for 30 days   atorvastatin  (LIPITOR) 80 mg, Oral, Daily   escitalopram  (LEXAPRO ) 5 MG tablet Take 1 tablet (5 mg total) by mouth in the morning for moods.   ezetimibe  (ZETIA ) 10 MG tablet Take 1 tablet (10 mg total) by mouth daily for cholesterol.   fluconazole (DIFLUCAN) 150 MG tablet Take 1 tablet (150  mg total) by mouth now,may repeat in 72 hours if needed and symptoms persist.   furosemide  (LASIX ) 40 mg, Oral, Every morning   Jardiance  10 mg, Oral, Daily   metoprolol  succinate (TOPROL -XL) 25 mg, Oral, Daily   pantoprazole  (PROTONIX ) 40 mg, Oral, Daily   Potassium Chloride  ER 20 MEQ TBCR 20 mEq, Oral, Every other day   Zinc  Sulfate 220 (50 Zn) MG TABS Take 1 tablet (220 mg total) by mouth daily for 30 days    Diet Orders (From admission, onward)     Start     Ordered   11/13/23 1603  Diet 2 gram sodium Room service appropriate? Yes; Fluid consistency: Thin  Diet effective now       Question Answer Comment  Room service appropriate? Yes   Fluid consistency: Thin      11/13/23 1602            DVT prophylaxis:  apixaban  (ELIQUIS ) tablet 5 mg   Lab Results  Component Value Date   PLT 214 11/13/2023      Code Status: Full Code  Family Communication: No family at bedside  Status is: Inpatient Remains inpatient appropriate because: Severity of illness   Level of care: Progressive  Consultants:  OB/GYN  Objective: Vitals:   11/14/23 0700 11/14/23 0757 11/14/23 0800 11/14/23 0900  BP: 119/84  (!) 140/91 124/85  Pulse: 81  96 87  Resp: 13  (!) 25 (!) 23  Temp:  97.9 F (36.6 C)    TempSrc:  Axillary    SpO2: 100%  99% 98%  Weight:      Height:        Intake/Output Summary (Last 24 hours) at 11/14/2023 0941 Last data filed at 11/13/2023 2258 Gross per 24 hour  Intake 100.52 ml  Output --  Net 100.52 ml   Wt Readings from Last 3 Encounters:  11/13/23 81.6 kg  10/15/23 79 kg  05/13/23 79.2 kg    Examination:  Constitutional: NAD Eyes: no scleral icterus ENMT: Mucous membranes are moist.  Neck: normal, supple Respiratory: clear to auscultation bilaterally, no wheezing, no crackles. Normal respiratory effort. No accessory muscle use.  Cardiovascular: Irregular, 1+ edema, chronic venous stasis changes bilateral lower extremities Abdomen: non  distended, no tenderness. Bowel sounds positive.  Musculoskeletal: no clubbing / cyanosis.    Data Reviewed: I have independently reviewed following labs and imaging studies   CBC Recent Labs  Lab 11/13/23 0920 11/13/23 0921  WBC 15.2*  --   HGB 12.4 12.6  12.9  HCT 39.3 37.0  38.0  PLT 214  --   MCV 93.6  --   MCH 29.5  --   MCHC 31.6  --   RDW 17.0*  --   LYMPHSABS 0.9  --   MONOABS 0.9  --   EOSABS 0.1  --   BASOSABS 0.1  --     Recent Labs  Lab 11/13/23 0918 11/13/23 0920 11/13/23 0921 11/13/23 0922 11/13/23 1107  NA  --  139 137  138  --   --  K  --  4.8 4.4  4.5  --   --   CL  --  110 113*  --   --   CO2  --  16*  --   --   --   GLUCOSE  --  97 97  --   --   BUN  --  18 22  --   --   CREATININE  --  1.08* 1.10*  --   --   CALCIUM   --  8.9  --   --   --   AST  --  46*  --   --   --   ALT  --  51*  --   --   --   ALKPHOS  --  216*  --   --   --   BILITOT  --  1.0  --   --   --   ALBUMIN   --  3.0*  --   --   --   MG  --  1.8  --   --   --   LATICACIDVEN  --   --   --  3.8* 2.2*  INR  --  1.0  --   --   --   TSH 0.445  --   --   --   --   BNP  --  915.1*  --   --   --     ------------------------------------------------------------------------------------------------------------------ No results for input(s): CHOL, HDL, LDLCALC, TRIG, CHOLHDL, LDLDIRECT in the last 72 hours.  Lab Results  Component Value Date   HGBA1C 4.2 (L) 10/12/2023   ------------------------------------------------------------------------------------------------------------------ Recent Labs    11/13/23 0918  TSH 0.445    Cardiac Enzymes No results for input(s): CKMB, TROPONINI, MYOGLOBIN in the last 168 hours.  Invalid input(s): CK ------------------------------------------------------------------------------------------------------------------    Component Value Date/Time   BNP 915.1 (H) 11/13/2023 0920    CBG: No results for input(s):  GLUCAP in the last 168 hours.  Recent Results (from the past 240 hours)  Blood Culture (routine x 2)     Status: None (Preliminary result)   Collection Time: 11/13/23  8:38 AM   Specimen: BLOOD LEFT ARM  Result Value Ref Range Status   Specimen Description BLOOD LEFT ARM  Final   Special Requests   Final    BOTTLES DRAWN AEROBIC AND ANAEROBIC Blood Culture results may not be optimal due to an inadequate volume of blood received in culture bottles   Culture   Final    NO GROWTH < 24 HOURS Performed at [facility], [address] [place], [place] [address]    Report Status PENDING  Incomplete  Blood Culture (routine x 2)     Status: None (Preliminary result)   Collection Time: 11/13/23  9:14 AM   Specimen: BLOOD RIGHT HAND  Result Value Ref Range Status   Specimen Description BLOOD RIGHT HAND  Final   Special Requests   Final    BOTTLES DRAWN AEROBIC AND ANAEROBIC Blood Culture results may not be optimal due to an inadequate volume of blood received in culture bottles   Culture   Final    NO GROWTH < 24 HOURS Performed at [facility], [address], [place], [place] [address]    Report Status PENDING  Incomplete  Resp panel by RT-PCR (RSV, Flu A&B, Covid) Anterior Nasal Swab     Status: None   Collection Time: 11/13/23  9:18 AM   Specimen: Anterior Nasal  Swab  Result Value Ref Range Status   SARS Coronavirus 2 by RT PCR NEGATIVE NEGATIVE Final   Influenza A by PCR NEGATIVE NEGATIVE Final   Influenza B by PCR NEGATIVE NEGATIVE Final    Comment: (NOTE) The Xpert Xpress SARS-CoV-2/FLU/RSV plus assay is intended as an aid in the diagnosis of influenza from Nasopharyngeal swab specimens and should not be used as a sole basis for treatment. Nasal washings and aspirates are unacceptable for Xpert Xpress SARS-CoV-2/FLU/RSV testing.  Fact Sheet for Patients: bloggercourse.com  Fact Sheet for Healthcare  Providers: seriousbroker.it  This test is not yet approved or cleared by the [place]  FDA and has been authorized for detection and/or diagnosis of SARS-CoV-2 by FDA under an Emergency Use Authorization (EUA). This EUA will remain in effect (meaning this test can be used) for the duration of the COVID-19 declaration under Section 564(b)(1) of the Act, 21 U.S.C. section 360bbb-3(b)(1), unless the authorization is terminated or revoked.     Resp Syncytial Virus by PCR NEGATIVE NEGATIVE Final    Comment: (NOTE) Fact Sheet for Patients: bloggercourse.com  Fact Sheet for Healthcare Providers: seriousbroker.it  This test is not yet approved or cleared by the [place]  FDA and has been authorized for detection and/or diagnosis of SARS-CoV-2 by FDA under an Emergency Use Authorization (EUA). This EUA will remain in effect (meaning this test can be used) for the duration of the COVID-19 declaration under Section 564(b)(1) of the Act, 21 U.S.C. section 360bbb-3(b)(1), unless the authorization is terminated or revoked.  Performed at [facility], [address], [place], [place] [address]      Radiology Studies: DG Tibia/Fibula Right Result Date: 11/13/2023 EXAM: _VIEWS_ VIEW(S) XRAY OF THE _LATERALITY_ TIBIA AND FIBULA 11/13/2023 02:35:48 PM COMPARISON: None available. CLINICAL HISTORY: wound FINDINGS: BONES AND JOINTS: No acute fracture. No focal osseous lesion. No joint dislocation. SOFT TISSUES: Diffuse subcutaneous soft tissue edema. IMPRESSION: 1. Diffuse subcutaneous soft tissue edema. 2. No acute osseous findings. Electronically signed by: Waddell Calk MD 11/13/2023 03:37 PM EDT RP Workstation: [id number]   CT CHEST ABDOMEN PELVIS W CONTRAST Result Date: 11/13/2023 CLINICAL DATA:  Sepsis * Tracking Code: BO * EXAM: CT CHEST, ABDOMEN, AND PELVIS WITH CONTRAST TECHNIQUE: Multidetector CT  imaging of the chest, abdomen and pelvis was performed following the standard protocol during bolus administration of intravenous contrast. RADIATION DOSE REDUCTION: This exam was performed according to the departmental dose-optimization program which includes automated exposure control, adjustment of the mA and/or kV according to patient size and/or use of iterative reconstruction technique. CONTRAST:  75mL OMNIPAQUE  IOHEXOL  350 MG/ML SOLN COMPARISON:  CT chest angiogram, 05/10/2023 FINDINGS: CT CHEST FINDINGS Cardiovascular: Scattered aortic atherosclerosis. Cardiomegaly. Three-vessel coronary artery calcifications. No pericardial effusion. Mediastinum/Nodes: Unchanged prominent mediastinal lymph nodes, high right paratracheal node measuring 1.6 x 0.8 cm (series 3, image 13). Heterogeneous nodular thyroid and isthmus (series 3, image 12). Trachea, and esophagus demonstrate no significant findings. Lungs/Pleura: Diffuse interlobular septal thickening, mosaic attenuation of the airspaces, and diffuse bilateral bronchial wall thickening. Small benign calcified nodule of the medial right lung base requiring no further follow-up or characterization (series 5, image 110). No pleural effusion or pneumothorax. Musculoskeletal: No chest wall abnormality. No acute osseous findings. CT ABDOMEN PELVIS FINDINGS Hepatobiliary: No solid liver abnormality is seen. No gallstones, gallbladder wall thickening, or biliary dilatation. Pancreas: Unremarkable. No pancreatic ductal dilatation or surrounding inflammatory changes. Spleen: Normal in size without significant abnormality. Adrenals/Urinary Tract: Adrenal glands are unremarkable. Kidneys are normal, without  renal calculi, solid lesion, or hydronephrosis. Bladder is unremarkable. Stomach/Bowel: Stomach is within normal limits. Appendix appears normal. No evidence of bowel wall thickening, distention, or inflammatory changes. Vascular/Lymphatic: Aortic atherosclerosis. No  enlarged abdominal or pelvic lymph nodes. Reproductive: Large cystic lesions of the ovaries, on the right, situated in the right lower quadrant measuring 12.2 x 8.1 x 8.4 cm (series 3, image 93) and arising from the left ovary situated in the posterior pelvis with a visible internal septation measuring 9.0 x 7.9 x 6.6 cm (series 3, image 100). Other: No abdominal wall hernia or abnormality. No ascites. Musculoskeletal: No acute osseous findings. IMPRESSION: 1. Diffuse interlobular septal thickening, mosaic attenuation of the airspaces, and diffuse bilateral bronchial wall thickening, consistent with pulmonary edema. 2. Cardiomegaly and coronary artery disease. 3. Large cystic lesions of the ovaries, on the right, situated in the right lower quadrant measuring 12.2 x 8.1 x 8.4 cm and arising from the left ovary situated in the posterior pelvis with a visible internal septation measuring 9.0 x 7.9 x 6.6 cm. 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 Recommend gynecologic referral and consideration of further evaluation with pelvic ultrasound and/or contrast enhanced pelvic MRI. 4. Unchanged prominent mediastinal lymph nodes, nonspecific and likely reactive. 5. Heterogeneous nodular thyroid and isthmus. Recommend dedicated thyroid ultrasound on a nonemergent, outpatient basis if and when clinically appropriate. Aortic Atherosclerosis (ICD10-I70.0). Electronically Signed   By: Marolyn JONETTA Jaksch M.D.   On: 11/13/2023 13:41   Nilda Fendt, MD, PhD [facility]  Between 7 am - 7 pm I am available, please contact me via Amion (for emergencies) or Securechat (non urgent messages)  Between 7 pm - 7 am I am not available, please contact night coverage MD/APP via Amion

## 2023-11-14 NOTE — Consult Note (Signed)
 WOC Nurse Consult Note: this patient is well known to WOC team from previous consults regarding chronic leg wounds; has refused any type of wrapping to legs in past  Reason for Consult: multiple wounds  Wound type: 1.  Full thickness L anterior leg likely r/t venous insufficiency 80% tan 20% red; full thickness R posterior leg pink moist  2.  Intertriginous dermatitis  w/fungal component noted to groin/mons/inner thighs erythema with scattered partial thickness skin loss and satellite lesions  3.  Moisture Associated skin damage to lower buttocks/perineum  ICD-10 CM Codes for Irritant Dermatitis L24A2 - Due to fecal, urinary or dual incontinence  L30.4  - Erythema intertrigo. Also used for abrasion of the hand, chafing of the skin, dermatitis due to sweating and friction, friction dermatitis, friction eczema, and genital/thigh intertrigo.  Pressure Injury POA: NA not pressure related  Measurement: see nursing flowsheet  Wound bed: legs as above; buttocks, ischium and posterior thighs with partial thickness skin loss red moist  Drainage (amount, consistency, odor) see nursing flowsheet  Periwound: legs with edema, erythema and dry hyperkeratotic skin  Dressing procedure/placement/frequency: Cleanse B lower leg wounds with NS, apply Xeroform gauze ([place] [phone]) to wound beds daily and secure with Kerlix roll gauze preferably wrapped from right above toes to right below knees.  Apply Ace bandage wrapped in same fashion for light compression as patient will allow.    MD has written for Nystatin cream to areas of moisture damage/fungal infection 2 times daily.    POC discussed with bedside nurse. WOC team will not follow. Re-consult if further needs arise.   Thank you,    Powell Bar MSN, RN-BC, [employer]

## 2023-11-14 NOTE — Plan of Care (Signed)

## 2023-11-14 NOTE — Progress Notes (Signed)
 Gynecology Consult Progress Note  Admission Date: 11/13/2023 Current Date: 11/14/2023 8:54 AM  Christine Jacobson is a 68 y.o. G0 HD#2 admitted for +SIRS criteria with vulvar cellulitis noted along with large b/l adnexal masses   History complicated by: Patient Active Problem List   Diagnosis Date Noted   Vulvar cellulitis 11/13/2023   Ovarian mass, right 11/13/2023   Ovarian mass, left 11/13/2023   Sepsis (HCC) 11/13/2023   Coronary artery calcification seen on CAT scan 10/13/2023   Elevated troponin 10/12/2023   Abnormal stress test 10/12/2023   Acute diastolic (congestive) heart failure (HCC) 10/11/2023   Persistent atrial fibrillation (HCC) 12/28/2022   Acute on chronic diastolic CHF (congestive heart failure) (HCC) 12/27/2022   Major depressive disorder, 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   MDD (major depressive disorder) 09/29/2022   Anxiety 09/16/2022   Acute exacerbation of CHF (congestive heart failure) (HCC) 09/15/2022   Atrial fibrillation by electrocardiogram (HCC) 09/15/2022   Leg swelling 09/15/2022   GIB (gastrointestinal bleeding) 08/05/2022   Acquired thrombophilia 08/05/2022   Chronic anticoagulation 08/05/2022   Acute blood loss anemia 08/04/2022   Cellulitis of both lower extremities 07/13/2022   Hypocarbia 07/13/2022   Normocytic anemia 07/13/2022   Cellulitis 06/02/2022   Sepsis due to cellulitis (HCC) 06/02/2022   AKI (acute kidney injury) 06/02/2022   Hyponatremia 06/02/2022   Dehydration 06/02/2022   Metabolic acidosis 06/02/2022   Pulmonary HTN (HCC)    Cellulitis and abscess of left lower extremity 10/13/2021   PAF (paroxysmal atrial fibrillation) (HCC) 07/28/2021   Obesity (BMI 30-39.9)    Liver function test abnormality    Primary hypertension    Chronic diastolic heart failure (HCC) 12/15/2016   HOCM (hypertrophic obstructive cardiomyopathy) (HCC) 01/22/2016   Lower extremity edema 09/17/2014   History of syncope  12/05/2013   Syncope 12/05/2013   Pneumonia 11/23/2012   Shortness of breath 07/11/2011   Hypertension 11/23/2010   Chest pain of uncertain etiology 10/18/2010   Murmur 10/18/2010   Palpitations 09/23/2010   Mild hyperlipidemia    Elevated BP    Hyperprolactinemia (HCC)    Pyoderma gangrenosa (HCC)    ROS and patient/family/surgical history, located on admission H&P note dated 11/13/2023, have been reviewed, and there are no changes except as noted below Yesterday/Overnight Events:  none  Subjective:  Patient feels better; no issues with eating  Objective:     Current Vital Signs 24h Vital Sign Ranges  T 97.9 F (36.6 C) Temp  Avg: 98.1 F (36.7 C)  Min: 97.7 F (36.5 C)  Max: 99.1 F (37.3 C)  BP (!) 140/91 BP  Min: 91/75  Max: 140/91  HR 96 Pulse  Avg: 110.5  Min: 52  Max: 150  RR (!) 25 Resp  Avg: 21.6  Min: 13  Max: 32  SaO2 99 %   SpO2  Avg: 97.6 %  Min: 93 %  Max: 100 %       24 Hour I/O Current Shift I/O  Time Ins Outs 10/27 0701 - 10/28 0700 In: 100.5  Out: -  No intake/output data recorded.   Patient Vitals for the past 12 hrs:  BP Temp Temp src Pulse Resp SpO2  11/14/23 0800 (!) 140/91 -- -- 96 (!) 25 99 %  11/14/23 0757 -- 97.9 F (36.6 C) Axillary -- -- --  11/14/23 0700 119/84 -- -- 81 13 100 %  11/14/23 0400 103/71 -- -- 82 15 100 %  11/14/23 0300 129/75 -- -- 83  15 100 %  11/14/23 0200 137/80 -- -- 83 16 100 %  11/14/23 0100 117/85 97.9 F (36.6 C) Oral 84 15 98 %  11/13/23 2206 -- 97.7 F (36.5 C) Oral -- -- --  11/13/23 2200 118/83 -- -- 87 13 100 %   Physical exam: General appearance: alert, cooperative, and appears stated age Abdomen: soft, nttp, ?mildly distended GU: erythema appears much improved from the mons and superior inguinal areas and stable severe erythema from there down pas to the rectal area but pt tolerates me examining this area much better than yesterday. Slight normal appearing barrier cream in inguinal folds. No gross VB,  foley in place with normal appearing UOP Lungs: no respiratory distress Skin: warm and dry, see above Psych: appropriate Neurologic: Grossly normal  Medications Current Facility-Administered Medications  Medication Dose Route Frequency Provider Last Rate Last Admin   0.9 %  sodium chloride  infusion   Intravenous Continuous Georgina Basket, MD 75 mL/hr at 11/13/23 1651 New Bag at 11/13/23 1651   amiodarone (NEXTERONE PREMIX) 360-4.14 MG/200ML-% (1.8 mg/mL) IV infusion  30 mg/hr Intravenous Continuous Melvenia Motto, MD 16.67 mL/hr at 11/14/23 0701 30 mg/hr at 11/14/23 0701   apixaban  (ELIQUIS ) tablet 5 mg  5 mg Oral BID Georgina Basket, MD   5 mg at 11/13/23 2203   atorvastatin  (LIPITOR) tablet 80 mg  80 mg Oral Daily Georgina Basket, MD       barrier cream (non-specified) 1 Application  1 Application Topical BID PRN Izell Harari, MD       ceFEPIme  (MAXIPIME ) 2 g in sodium chloride  0.9 % 100 mL IVPB  2 g Intravenous Q12H Georgina Basket, MD   Stopped at 11/13/23 2258   escitalopram  (LEXAPRO ) tablet 5 mg  5 mg Oral Daily Georgina Basket, MD       fentaNYL  (SUBLIMAZE ) injection 100 mcg  100 mcg Intravenous Once Melvenia Motto, MD       fentaNYL  (SUBLIMAZE ) injection 50 mcg  50 mcg Intravenous Q1H PRN Melvenia Motto, MD   50 mcg at 11/13/23 1851   pantoprazole  (PROTONIX ) EC tablet 40 mg  40 mg Oral Daily Georgina Basket, MD   40 mg at 11/13/23 2052   valACYclovir (VALTREX) tablet 1,000 mg  1,000 mg Oral BID Izell Harari, MD   1,000 mg at 11/13/23 2203   vancomycin  (VANCOREADY) IVPB 750 mg/150 mL  750 mg Intravenous Q24H Georgina Basket, MD       Labs  BCx: NGTD Negative: CA125 (5.7) , HIV  Radiology No new imaging  Assessment & Plan:  Patient improving *Vulvar cellulitis: unknown etiology. continue with broad spec abx. I clarified with patient and she has not been sexually active in many, many years and denies any h/o HSV. Continue valtrex, along with broad spec abx. Due for another dose of  diflucan/anti-fungal on 10/29>will need topical due to cardiac meds *Adnexal masses: d/w her that normal CA125 is reassuring but doesn't rule out cancer and she'll need GYN Onc follow up as an outpatient, as I don't feel that they are the cause of her issues. She sates that she's never had a colonoscopy.  Code Status: Full Code  Total time taking care of the patient was 20 minutes, with greater than 50% of the time spent in face to face interaction with the patient.  Harari Izell Raddle MD Attending Center for [facility] (Faculty Practice) GYN Consult Phone: [phone] (M-F, [phone]) & [phone] (Off hours, weekends, holidays)

## 2023-11-15 ENCOUNTER — Ambulatory Visit: Attending: Physician Assistant | Admitting: Physician Assistant

## 2023-11-15 ENCOUNTER — Other Ambulatory Visit (HOSPITAL_COMMUNITY): Payer: Self-pay

## 2023-11-15 ENCOUNTER — Other Ambulatory Visit: Payer: Self-pay | Admitting: Obstetrics and Gynecology

## 2023-11-15 ENCOUNTER — Telehealth (HOSPITAL_COMMUNITY): Payer: Self-pay | Admitting: Pharmacy Technician

## 2023-11-15 DIAGNOSIS — R6520 Severe sepsis without septic shock: Secondary | ICD-10-CM

## 2023-11-15 DIAGNOSIS — I480 Paroxysmal atrial fibrillation: Secondary | ICD-10-CM | POA: Diagnosis not present

## 2023-11-15 DIAGNOSIS — N762 Acute vulvitis: Secondary | ICD-10-CM | POA: Diagnosis not present

## 2023-11-15 DIAGNOSIS — R1900 Intra-abdominal and pelvic swelling, mass and lump, unspecified site: Secondary | ICD-10-CM

## 2023-11-15 DIAGNOSIS — A419 Sepsis, unspecified organism: Secondary | ICD-10-CM | POA: Diagnosis not present

## 2023-11-15 DIAGNOSIS — I421 Obstructive hypertrophic cardiomyopathy: Secondary | ICD-10-CM | POA: Diagnosis not present

## 2023-11-15 LAB — COMPREHENSIVE METABOLIC PANEL WITH GFR
ALT: 44 U/L (ref 0–44)
AST: 44 U/L — ABNORMAL HIGH (ref 15–41)
Albumin: 2.6 g/dL — ABNORMAL LOW (ref 3.5–5.0)
Alkaline Phosphatase: 175 U/L — ABNORMAL HIGH (ref 38–126)
Anion gap: 9 (ref 5–15)
BUN: 22 mg/dL (ref 8–23)
CO2: 18 mmol/L — ABNORMAL LOW (ref 22–32)
Calcium: 8.7 mg/dL — ABNORMAL LOW (ref 8.9–10.3)
Chloride: 109 mmol/L (ref 98–111)
Creatinine, Ser: 1.4 mg/dL — ABNORMAL HIGH (ref 0.44–1.00)
GFR, Estimated: 40 mL/min — ABNORMAL LOW (ref >60)
Glucose, Bld: 126 mg/dL — ABNORMAL HIGH (ref 70–99)
Potassium: 3.9 mmol/L (ref 3.5–5.1)
Sodium: 136 mmol/L (ref 135–145)
Total Bilirubin: 0.9 mg/dL (ref 0.0–1.2)
Total Protein: 5.7 g/dL — ABNORMAL LOW (ref 6.5–8.1)

## 2023-11-15 LAB — CBC
HCT: 34 % — ABNORMAL LOW (ref 36.0–46.0)
Hemoglobin: 10.9 g/dL — ABNORMAL LOW (ref 12.0–15.0)
MCH: 29.5 pg (ref 26.0–34.0)
MCHC: 32.1 g/dL (ref 30.0–36.0)
MCV: 92.1 fL (ref 80.0–100.0)
Platelets: 223 K/uL (ref 150–400)
RBC: 3.69 MIL/uL — ABNORMAL LOW (ref 3.87–5.11)
RDW: 16.8 % — ABNORMAL HIGH (ref 11.5–15.5)
WBC: 10.1 K/uL (ref 4.0–10.5)
nRBC: 0.2 % (ref 0.0–0.2)

## 2023-11-15 LAB — HSV(HERPES SIMPLEX VRS) I + II AB-IGG
HSV 1 Glycoprotein G Ab, IgG: NONREACTIVE
HSV 2 Glycoprotein G Ab, IgG: NONREACTIVE

## 2023-11-15 LAB — PHOSPHORUS: Phosphorus: 2.3 mg/dL — ABNORMAL LOW (ref 2.5–4.6)

## 2023-11-15 LAB — MAGNESIUM: Magnesium: 1.8 mg/dL (ref 1.7–2.4)

## 2023-11-15 MED ORDER — FLUCONAZOLE 150 MG PO TABS: 150.0000 mg | ORAL_TABLET | Freq: Once | ORAL | Status: DC

## 2023-11-15 MED ORDER — LIDOCAINE 5 % EX PTCH
1.0000 | MEDICATED_PATCH | CUTANEOUS | Status: AC
  Administered 2023-11-15: 1 via TRANSDERMAL
  Filled 2023-11-15: qty 1

## 2023-11-15 MED ORDER — FLUCONAZOLE 150 MG PO TABS
150.0000 mg | ORAL_TABLET | Freq: Once | ORAL | Status: AC
  Administered 2023-11-15: 150 mg via ORAL
  Filled 2023-11-15: qty 1

## 2023-11-15 MED ORDER — ONDANSETRON HCL 4 MG/2ML IJ SOLN
4.0000 mg | Freq: Four times a day (QID) | INTRAMUSCULAR | Status: DC | PRN
  Administered 2023-11-15: 4 mg via INTRAVENOUS
  Filled 2023-11-15 (×2): qty 2

## 2023-11-15 MED ORDER — FLUCONAZOLE 150 MG PO TABS
150.0000 mg | ORAL_TABLET | Freq: Every day | ORAL | Status: DC
  Filled 2023-11-15: qty 1

## 2023-11-15 MED ORDER — FLUCONAZOLE 150 MG PO TABS: 150.0000 mg | ORAL_TABLET | ORAL | Status: DC

## 2023-11-15 MED ORDER — LINEZOLID 600 MG PO TABS
600.0000 mg | ORAL_TABLET | Freq: Two times a day (BID) | ORAL | Status: DC
  Administered 2023-11-15 – 2023-11-18 (×7): 600 mg via ORAL
  Filled 2023-11-15 (×8): qty 1

## 2023-11-15 MED ORDER — METOPROLOL SUCCINATE ER 50 MG PO TB24
50.0000 mg | ORAL_TABLET | Freq: Every day | ORAL | Status: DC
  Administered 2023-11-15 – 2023-11-18 (×4): 50 mg via ORAL
  Filled 2023-11-15 (×4): qty 1

## 2023-11-15 MED ORDER — ACETAMINOPHEN 500 MG PO TABS
500.0000 mg | ORAL_TABLET | Freq: Once | ORAL | Status: AC | PRN
  Administered 2023-11-15: 500 mg via ORAL
  Filled 2023-11-15: qty 1

## 2023-11-15 NOTE — Progress Notes (Addendum)
 Gynecology Consult Progress Note  Admission Date: 11/13/2023 Current Date: 11/15/2023 9:33 AM  Christine Jacobson is a 68 y.o. G0 HD#3 admitted for +SIRS criteria with vulvar cellulitis noted along with large b/l adnexal masses   History complicated by: Patient Active Problem List   Diagnosis Date Noted   Vulvar cellulitis 11/13/2023   Ovarian mass, right 11/13/2023   Ovarian mass, left 11/13/2023   Sepsis (HCC) 11/13/2023   Coronary artery calcification seen on CAT scan 10/13/2023   Elevated troponin 10/12/2023   Abnormal stress test 10/12/2023   Acute diastolic (congestive) heart failure (HCC) 10/11/2023   Persistent atrial fibrillation (HCC) 12/28/2022   Acute on chronic diastolic CHF (congestive heart failure) (HCC) 12/27/2022   Major depressive disorder, 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   MDD (major depressive disorder) 09/29/2022   Anxiety 09/16/2022   Acute exacerbation of CHF (congestive heart failure) (HCC) 09/15/2022   Atrial fibrillation by electrocardiogram (HCC) 09/15/2022   Leg swelling 09/15/2022   GIB (gastrointestinal bleeding) 08/05/2022   Acquired thrombophilia 08/05/2022   Chronic anticoagulation 08/05/2022   Acute blood loss anemia 08/04/2022   Cellulitis of both lower extremities 07/13/2022   Hypocarbia 07/13/2022   Normocytic anemia 07/13/2022   Cellulitis 06/02/2022   Sepsis due to cellulitis (HCC) 06/02/2022   AKI (acute kidney injury) 06/02/2022   Hyponatremia 06/02/2022   Dehydration 06/02/2022   Metabolic acidosis 06/02/2022   Pulmonary HTN (HCC)    Cellulitis and abscess of left lower extremity 10/13/2021   PAF (paroxysmal atrial fibrillation) (HCC) 07/28/2021   Obesity (BMI 30-39.9)    Liver function test abnormality    Primary hypertension    Chronic diastolic heart failure (HCC) 12/15/2016   HOCM (hypertrophic obstructive cardiomyopathy) (HCC) 01/22/2016   Lower extremity edema 09/17/2014   History of syncope  12/05/2013   Syncope 12/05/2013   Pneumonia 11/23/2012   Shortness of breath 07/11/2011   Hypertension 11/23/2010   Chest pain of uncertain etiology 10/18/2010   Murmur 10/18/2010   Palpitations 09/23/2010   Mild hyperlipidemia    Elevated BP    Hyperprolactinemia (HCC)    Pyoderma gangrenosa (HCC)    ROS and patient/family/surgical history, located on admission H&P note dated 11/13/2023, have been reviewed, and there are no changes except as noted below Yesterday/Overnight Events:  none  Subjective:  Patient feels that GU area feels to be improving  Objective:     Current Vital Signs 24h Vital Sign Ranges  T 98.3 F (36.8 C) Temp  Avg: 98.4 F (36.9 C)  Min: 97.9 F (36.6 C)  Max: 98.9 F (37.2 C)  BP 122/89 BP  Min: 94/78  Max: 141/91  HR 82 Pulse  Avg: 87.6  Min: 77  Max: 111  RR 16 Resp  Avg: 19  Min: 10  Max: 24  SaO2 92 % Nasal Cannula SpO2  Avg: 96 %  Min: 92 %  Max: 100 %       24 Hour I/O Current Shift I/O  Time Ins Outs 10/28 0701 - 10/29 0700 In: 2776 [P.O.:476; I.V.:1946.8] Out: 1750 [Urine:1750] No intake/output data recorded.   Patient Vitals for the past 12 hrs:  BP Temp Temp src Pulse Resp SpO2  11/15/23 0809 122/89 98.3 F (36.8 C) Oral 82 16 92 %  11/15/23 0300 130/88 98.3 F (36.8 C) Oral (!) 111 20 93 %  11/14/23 2340 108/78 98.9 F (37.2 C) Oral 77 (!) 22 --   Physical exam: General appearance: alert, cooperative, and appears  stated age Abdomen: soft, nttp, ?mildly distended GU: erythema appears improved from the mons and superior inguinal areas with just an outline of the erythema seen on the right inguinal fold and on the right the erythema is much less intense. Improving erythema from there and lower to mid mons down to the vulvar and rectal area. For the first time, I had the patient roll to her side and it appears the erythema extends to the lower sacrum.  No gross VB, foley in place with normal appearing UOP Lungs: no respiratory  distress Skin: warm and dry, see above Psych: appropriate Neurologic: Grossly normal  Medications Current Facility-Administered Medications  Medication Dose Route Frequency Provider Last Rate Last Admin   apixaban  (ELIQUIS ) tablet 5 mg  5 mg Oral BID Georgina Basket, MD   5 mg at 11/15/23 0818   atorvastatin  (LIPITOR) tablet 80 mg  80 mg Oral Daily Georgina Basket, MD   80 mg at 11/15/23 0818   ceFEPIme  (MAXIPIME ) 2 g in sodium chloride  0.9 % 100 mL IVPB  2 g Intravenous Q12H Georgina Basket, MD 200 mL/hr at 11/15/23 0813 2 g at 11/15/23 0813   Chlorhexidine  Gluconate Cloth 2 % PADS 6 each  6 each Topical Daily Gherghe, Costin M, MD   6 each at 11/14/23 1603   escitalopram  (LEXAPRO ) tablet 5 mg  5 mg Oral Daily Georgina Basket, MD   5 mg at 11/15/23 0818   lidocaine  (LIDODERM ) 5 % 1 patch  1 patch Transdermal Q24H Rathore, Vasundhra, MD   1 patch at 11/15/23 0114   linezolid (ZYVOX) tablet 600 mg  600 mg Oral Q12H Gonfa, Taye T, MD   600 mg at 11/15/23 1038   metoprolol  succinate (TOPROL -XL) 24 hr tablet 50 mg  50 mg Oral Daily Gonfa, Taye T, MD   50 mg at 11/15/23 1038   nystatin ointment (MYCOSTATIN)   Topical BID Izell Harari, MD   Given at 11/15/23 1039   pantoprazole  (PROTONIX ) EC tablet 40 mg  40 mg Oral Daily Georgina Basket, MD   40 mg at 11/15/23 0818    Labs  BCx: NGTD Negative: hsv 1 and 2 IgG    Latest Ref Rng & Units 11/15/2023    2:18 AM 11/13/2023    9:21 AM 11/13/2023    9:20 AM  CBC  WBC 4.0 - 10.5 K/uL 10.1   15.2   Hemoglobin 12.0 - 15.0 g/dL 89.0  87.3    87.0  87.5   Hematocrit 36.0 - 46.0 % 34.0  37.0    38.0  39.3   Platelets 150 - 400 K/uL 223   214    Radiology No new imaging  Assessment & Plan:  Patient improving *Vulvar cellulitis: unknown etiology. Slowly improving and her numbers are improving, too. I told her that from a GYN perspective foley is okay to come out, due to increased infection risk. Will also stop the valtrex and pt on nystatin ointment and  for another dose of diflucan 150 today.  I also spoke to Dr. Fleeta Rothman with ID and he agrees with the zyvox, for a 14d total course, and he recommends stopping the cefepime . *Adnexal masses: outpatient gyn onc referral order placed for late November.  Code Status: Full Code  Total time taking care of the patient was 20 minutes, with greater than 50% of the time spent in face to face interaction with the patient.  Harari Izell Raddle MD Attending [facility] [employer] [facility]) GYN Consult  Phone: [phone] (M-F, 0800-1700) & [phone] (Off hours, weekends, holidays)

## 2023-11-15 NOTE — TOC Initial Note (Signed)
 Transition of Care [facility]) - Initial/Assessment Note    Patient Details  Name: Christine Jacobson MRN: [id number] Date of Birth: 03-04-1955  [facility]) CM/SW Contact:    Lauraine FORBES Saa, LCSWA Phone Number: 11/15/2023, 4:30 PM  Clinical Narrative:                  4:30 PM Per chart review, therapy does not have discharge recommendations for patient, and patient is not yet medically ready to discharge home due to severe sepsis. TOC will continue to follow.  Expected Discharge Plan: Home/Self Care Barriers to Discharge: Continued Medical Work up   Patient Goals and CMS Choice            Expected Discharge Plan and Services       Living arrangements for the past 2 months: Single Family Home                                      Prior Living Arrangements/Services Living arrangements for the past 2 months: Single Family Home Lives with:: Self Patient language and need for interpreter reviewed:: Yes          Care giver support system in place?: No (comment) Current home services: DME Criminal Activity/Legal Involvement Pertinent to Current Situation/Hospitalization: No - Comment as needed  Activities of Daily Living   ADL Screening (condition at time of admission) Independently performs ADLs?: Yes (appropriate for developmental age) Is the patient deaf or have difficulty hearing?: No Does the patient have difficulty seeing, even when wearing glasses/contacts?: No Does the patient have difficulty concentrating, remembering, or making decisions?: No  Permission Sought/Granted Permission sought to share information with : Other (comment) (Friend) Permission granted to share information with : No (Contact information on chart)  Share Information with NAME: Rock Angus     Permission granted to share info w Relationship: Friend  Permission granted to share info w Contact Information: [phone]  Emotional Assessment         Alcohol /  Substance Use: Not Applicable Psych Involvement: No (comment)  Admission diagnosis:  Cellulitis of groin [L03.314] Vaginal bleeding [N93.9] Cellulitis of right lower extremity [L03.115] Atrial fibrillation with RVR (HCC) [I48.91] Sepsis (HCC) [A41.9] Sepsis, due to unspecified organism, unspecified whether acute organ dysfunction present [facility]) [A41.9] Patient Active Problem List   Diagnosis Date Noted   Vulvar cellulitis 11/13/2023   Ovarian mass, right 11/13/2023   Ovarian mass, left 11/13/2023   Sepsis (HCC) 11/13/2023   Coronary artery calcification seen on CAT scan 10/13/2023   Elevated troponin 10/12/2023   Abnormal stress test 10/12/2023   Acute diastolic (congestive) heart failure (HCC) 10/11/2023   Persistent atrial fibrillation (HCC) 12/28/2022   Acute on chronic diastolic CHF (congestive heart failure) (HCC) 12/27/2022   Major depressive disorder, 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   MDD (major depressive disorder) 09/29/2022   Anxiety 09/16/2022   Acute exacerbation of CHF (congestive heart failure) (HCC) 09/15/2022   Atrial fibrillation by electrocardiogram (HCC) 09/15/2022   Leg swelling 09/15/2022   GIB (gastrointestinal bleeding) 08/05/2022   Acquired thrombophilia 08/05/2022   Chronic anticoagulation 08/05/2022   Acute blood loss anemia 08/04/2022   Cellulitis of both lower extremities 07/13/2022   Hypocarbia 07/13/2022   Normocytic anemia 07/13/2022   Cellulitis 06/02/2022   Sepsis due to cellulitis (HCC) 06/02/2022   AKI (acute kidney injury) 06/02/2022   Hyponatremia 06/02/2022  Dehydration 06/02/2022   Metabolic acidosis 06/02/2022   Pulmonary HTN (HCC)    Cellulitis and abscess of left lower extremity 10/13/2021   PAF (paroxysmal atrial fibrillation) (HCC) 07/28/2021   Obesity (BMI 30-39.9)    Liver function test abnormality    Primary hypertension    Chronic diastolic heart failure (HCC) 12/15/2016   HOCM (hypertrophic  obstructive cardiomyopathy) (HCC) 01/22/2016   Lower extremity edema 09/17/2014   History of syncope 12/05/2013   Syncope 12/05/2013   Pneumonia 11/23/2012   Shortness of breath 07/11/2011   Hypertension 11/23/2010   Chest pain of uncertain etiology 10/18/2010   Murmur 10/18/2010   Palpitations 09/23/2010   Mild hyperlipidemia    Elevated BP    Hyperprolactinemia (HCC)    Pyoderma gangrenosa (HCC)    PCP:  [name], NP Pharmacy:   [facility] [address] [place] [address] Phone: [phone] Fax: [phone]     Social Drivers of Health (SDOH) Social History: SDOH Screenings   Food Insecurity: No Food Insecurity (11/14/2023)  Housing: Low Risk  (11/14/2023)  Transportation Needs: No Transportation Needs (11/14/2023)  Utilities: Not At Risk (11/14/2023)  Depression (PHQ2-9): Low Risk  (10/26/2021)  Financial Resource Strain: Low Risk  (12/03/2021)  Social Connections: Unknown (11/14/2023)  Recent Concern: Social Connections - Socially Isolated (11/14/2023)  Tobacco Use: Medium Risk (11/13/2023)   SDOH Interventions:     Readmission Risk Interventions    05/12/2023    4:15 PM 12/28/2022   11:28 AM 08/07/2022   11:10 AM  Readmission Risk Prevention Plan  Transportation Screening Complete Complete Complete  PCP or Specialist Appt within 5-7 Days   Complete  PCP or Specialist Appt within 3-5 Days  Complete   Home Care Screening   Complete  Medication Review (RN CM)   Complete  HRI or Home Care Consult  Complete   Social Work Consult for Recovery Care Planning/Counseling  Complete   Palliative Care Screening  Not Applicable   Medication Review Oceanographer) Complete Complete   HRI or Home Care Consult Complete    Palliative Care Screening Not Applicable    Skilled Nursing Facility Not Applicable

## 2023-11-15 NOTE — Plan of Care (Signed)

## 2023-11-15 NOTE — Telephone Encounter (Signed)
 Patient Product/process Development Scientist completed.    The patient is insured through [employer]. Patient has Medicare and is not eligible for a copay card, but may be able to apply for patient assistance or Medicare RX Payment Plan (Patient Must reach out to their plan, if eligible for payment plan), if available.    Ran test claim for linezolid 600 mg  and the current 14 day co-pay is $27.29.   This test claim was processed through [facility]- copay amounts may vary at other pharmacies due to pharmacy/plan contracts, or as the patient moves through the different stages of their insurance plan.     Reyes Sharps, CPHT Pharmacy Technician Patient Advocate Specialist Lead [facility] Patient Advocate Team Direct Number: [phone]  Fax: [phone]

## 2023-11-15 NOTE — Progress Notes (Signed)
 Went in to assess patient. Pt has dirty(soiled with food) blanket and gown. Offered bath. Pt refused twice. Education given. Pt also declined mobility.

## 2023-11-15 NOTE — Evaluation (Addendum)
 Physical Therapy Evaluation Patient Details Name: Christine Jacobson MRN: [id number] DOB: [birth year] Today's Date: 11/15/2023  History of Present Illness  Pt is 68 yo presenting to [facility] on 10/27 due to presumed sepsis. Pt mainly came to hospital for vaginal pain and irritation. PMHx:  bil LE edema, HOCM, HTN, diastolic CHF, HLD, cardioversion  Clinical Impression  Limited evaluation today. Extra time spent with education and listening due to pt demonstrating high anxiety vs cognitive deficits. Pt was able to perform supine<>sitting at Mod I with HOB elevated and use of rails. Any questions not related to immediate situation or movement were very anxiety inducing for pt and when asked if she is dizzy or lightheaded pt then refused to further progress mobility stating that she was unable to because she didn't get any sleep after her RN reported she was sleeping all afternoon. Pt educated on importance of mobility in the hospital and provided with therapeutic listening. Pt was able to scoot up EOB at supervision level using bil UE with no assistance from bil LE demonstrating good UE strength. Due to pt current functional status, home set up and available assistance at home no recommended skilled physical therapy services at this time on discharge from acute care hospital setting. Will continue to follow in acute setting in order to ensure that pt returns home with decreased risk for falls, injury, re-hospitalization and improved activity tolerance.        If plan is discharge home, recommend the following: Supervision due to cognitive status;Assistance with cooking/housework;Assist for transportation     Equipment Recommendations None recommended by PT     Functional Status Assessment Patient has had a recent decline in their functional status and/or demonstrates limited ability to make significant improvements in function in a reasonable and predictable amount of time     Precautions / Restrictions  Precautions Precautions: Fall Recall of Precautions/Restrictions: Impaired Restrictions Weight Bearing Restrictions Per Provider Order: No      Mobility  Bed Mobility Overal bed mobility: Modified Independent             General bed mobility comments: used rails and was able to get to EOB with HOB elevated ~ 30 degrees. Once sitting EOB pt stated she could not stand up. When asked what was going on pt was unable to give a clear answer. Pt prompted with words inculding fatigue, dizziness, lightheadedness, pain but was becoming slightly agitated stating she just can't do it even though she also reported when asked that she was able to do it at home with no difficulty even when she has to use the bathroom at night. Pt able to scoot up EOB with bil UE fully clearing hips without assistance from LE    Transfers     General transfer comment: unable to assess.       Balance Overall balance assessment: No apparent balance deficits (not formally assessed) (only sitting EOB)         Pertinent Vitals/Pain Pain Assessment Pain Assessment: No/denies pain    Home Living Family/patient expects to be discharged to:: Private residence Living Arrangements: Alone Available Help at Discharge: Friend(s) Type of Home: Apartment Home Access: Stairs to enter Entrance Stairs-Rails: Left Entrance Stairs-Number of Steps: 6   Home Layout: One level Home Equipment: Agricultural Consultant (2 wheels) Additional Comments: Pt was not answering questions; information taken from prevoius note from one month ago    Prior Function Prior Level of Function : Independent/Modified Independent  Mobility Comments: walks with RW; doesn't drive, friends vs cab services provide transportation ADLs Comments: grocery shops through [employer]; bird bathes at baseline, independent with ADLs and iADLs, kitchen and laundry are communal     Extremity/Trunk Assessment   Upper Extremity  Assessment Upper Extremity Assessment: Overall WFL for tasks assessed    Lower Extremity Assessment Lower Extremity Assessment: Overall WFL for tasks assessed    Cervical / Trunk Assessment Cervical / Trunk Assessment: Normal  Communication   Communication Communication: Impaired Factors Affecting Communication: Difficulty expressing self    Cognition Arousal: Alert Behavior During Therapy: Anxious   PT - Cognitive impairments: Safety/Judgement, Awareness, No family/caregiver present to determine baseline     PT - Cognition Comments: Pt with no clear train of thought. Stating she is tired and didn't get any sleep but was left to sleep for 6 hours during the day. When asked if she slept during that time stated, Maybe when asked if she would like to go home or try to go to rehab to improve mobility pt stated  I can't think about that right now there is just too much going on demonstrating possible anxiety or cognitive issues. Most likely requires a higher level cognitive assessment. Following commands: Impaired Following commands impaired: Follows one step commands inconsistently     Cueing Cueing Techniques: Verbal cues     General Comments General comments (skin integrity, edema, etc.): Pt with bil stage III lymphedema with hyperkeratosis, hyperpigmentations, creping, papules noted. Pt O2 sats and HR remained WNL during session        Assessment/Plan    PT Assessment Patient needs continued PT services  PT Problem List Decreased mobility       PT Treatment Interventions DME instruction;Balance training;Gait training;Stair training;Patient/family education;Functional mobility training;Therapeutic activities;Therapeutic exercise    PT Goals (Current goals can be found in the Care Plan section)  Acute Rehab PT Goals Patient Stated Goal: Pt did not state any goals PT Goal Formulation: Patient unable to participate in goal setting Time For Goal Achievement:  11/29/23 Potential to Achieve Goals: Fair    Frequency Min 1X/week        AM-PAC PT 6 Clicks Mobility  Outcome Measure Help needed turning from your back to your side while in a flat bed without using bedrails?: None Help needed moving from lying on your back to sitting on the side of a flat bed without using bedrails?: None Help needed moving to and from a bed to a chair (including a wheelchair)?: A Little Help needed standing up from a chair using your arms (e.g., wheelchair or bedside chair)?: A Little Help needed to walk in hospital room?: A Little Help needed climbing 3-5 steps with a railing? : A Little 6 Click Score: 20    End of Session   Activity Tolerance: Other (comment) (pt self limiting and anxious) Patient left: in bed;with call bell/phone within reach Nurse Communication: Mobility status;Other (comment) (pt demonstrating good strength with bed mobility but decline OOB) PT Visit Diagnosis: Other abnormalities of gait and mobility (R26.89)    Time: 8497-8474 PT Time Calculation (min) (ACUTE ONLY): 23 min   Charges:   PT Evaluation $PT Eval Low Complexity: 1 Low PT Treatments $Therapeutic Activity: 8-22 mins PT General Charges $$ ACUTE PT VISIT: 1 Visit    Dorothyann Maier, DPT, CLT  Acute Rehabilitation Services Office: [phone] (Secure chat preferred)   Dorothyann VEAR Maier 11/15/2023, 3:45 PM

## 2023-11-15 NOTE — Progress Notes (Signed)
 PROGRESS NOTE  Christine Jacobson FMW:[id number] DOB: Sep 02, 1955   PCP: [name], NP  Patient is from: Home.  Lives alone.  Uses cane and rolling walker for mobility.  DOA: 11/13/2023 LOS: 2  Chief complaints Chief Complaint  Patient presents with   Vaginal Bleeding   sepsis   UTI     Brief Narrative / Interim history: 68 year old F with PMH of HOCM, HFpEF, HTN and HLD advised to go to ED by PCP after she reported severe vaginal yeast infection despite treatment with Diflucan, and admitted with sepsis due to vulvar cellulitis.  In ED, tachycardic and tachypneic.  WBC 15.  Lactic acid 3.8 and improved to 2.2.  COVID-19, influenza and RSV PCR nonreactive.  CT chest, abdomen and pelvis showed pulmonary edema and possible ovarian neoplasm.  GYN consulted.  Patient was started on amiodarone drip and broad-spectrum antibiotics and admitted.  Blood cultures NGTD.  MRSA PCR screen negative.  Evaluated by GYN.  Antibiotic de-escalated to Zyvox for 14 days total after consultation with ID.    Subjective: Seen and examined earlier this morning.  No major events overnight or this morning.  Reports feeling better but not specific.  Not a great historian.  Reports feeling tired.    Assessment and plan: Severe sepsis due to vulvar cellulitis: Present on arrival.  Has tachycardia, tachypnea, leukocytosis and lactic acidosis.  She was placed on broad-spectrum antibiotics.  Patient is on Jardiance  which could increase her risk of infection. -Antibiotic escalated to Zyvox for a total of 2 weeks per discussion between ID and GYN. -P.o. Diflucan 150 mg x 1 -Will remove Foley once she started getting out of the bed -OOB, PT/OT  Acute on chronic diastolic CHF, HOCM: TTE on 9/25 with LVEF of > 75, HOCM, severe LAE.  BNP elevated to 900s.  Imaging showed pulmonary edema and cardiomegaly.  No cardiopulmonary symptoms but extremely fatigued.  Briefly diuresed with IV Lasix .  Transition to p.o.   Appears euvolemic on exam.  Mild AKI. -Hold Lasix  -Monitor fluid and respiratory status.   Paroxysmal with RVR-mild RVR to 120s on arrival.  Started on amiodarone infusion.  Suspect her RVR was mainly driven by #1.  Initially started on amiodarone.  Transition to p.o. Toprol -XL. -Increase Toprol -XL to 50 mg daily - Continue Eliquis  for anticoagulation.  AKI: Baseline creatinine 1.0-1.1 Recent Labs    05/12/23 0329 05/13/23 0314 10/11/23 0935 10/12/23 0458 10/13/23 0431 10/14/23 0514 10/15/23 0504 11/13/23 0920 11/13/23 0921 11/15/23 0218  BUN 20 22 24* 27* 33* 31* 34* 18 22 22   CREATININE 1.38* 1.42* 1.01* 1.21* 1.23* 1.16* 1.27* 1.08* 1.10* 1.40*  - Discontinue Lasix  - Changed vancomycin  to Zyvox as above - Strict intake and output - Recheck renal function in the morning  Physical deconditioning/fatigue - OOB, PT/OT eval  Mood disorder: - Continue home Lexapro .  Ovarian mass c/f neoplasm - GYn recommended outpatient follow-up.  Chronic venous insufficiency stasis dermatitis - Continue leg evaluation and wound care.  Class I obesity Body mass index is 30.9 kg/m.          DVT prophylaxis:   apixaban  (ELIQUIS ) tablet 5 mg  Code Status: Full code Family Communication: None at bedside Level of care: Progressive Status is: Inpatient Remains inpatient appropriate because: Severe sepsis due to vulvar infection cellulitis   Final disposition: To be determined after therapy evaluation   55 minutes with more than 50% spent in reviewing records, counseling patient/family and coordinating care.  Consultants:  Gynecology Infectious  disease by gynecology  Procedures: None  Microbiology summarized: MRSA PCR screen nonreactive COVID-19, influenza and RSV PCR nonreactive Blood cultures NGTD  Objective: Vitals:   11/14/23 2340 11/15/23 0300 11/15/23 0809 11/15/23 1127  BP: 108/78 130/88 122/89 111/81  Pulse: 77 (!) 111 82 91  Resp: (!) 22 20 16 20   Temp:  98.9 F (37.2 C) 98.3 F (36.8 C) 98.3 F (36.8 C) 98.2 F (36.8 C)  TempSrc: Oral Oral Oral Oral  SpO2:  93% 92% 90%  Weight:      Height:        Examination:  GENERAL: No apparent distress.  Nontoxic. HEENT: MMM.  Vision and hearing grossly intact.  NECK: Supple.  No apparent JVD.  RESP:  No IWOB.  Fair aeration bilaterally. CVS:  RRR. Heart sounds normal.  ABD/GI/GU: BS+. Abd soft, NTND.  Foley catheter in place. MSK/EXT:  Moves extremities.  Bilateral stasis dermatitis. SKIN: no apparent skin lesion or wound NEURO: AA.  Oriented appropriately.  No apparent focal neuro deficit. PSYCH: Calm. Normal affect.   Sch Meds:  Scheduled Meds:  apixaban   5 mg Oral BID   atorvastatin   80 mg Oral Daily   Chlorhexidine  Gluconate Cloth  6 each Topical Daily   escitalopram   5 mg Oral Daily   linezolid  600 mg Oral Q12H   metoprolol  succinate  50 mg Oral Daily   nystatin ointment   Topical BID   pantoprazole   40 mg Oral Daily   Continuous Infusions:  ceFEPime  (MAXIPIME ) IV 2 g (11/15/23 0813)   PRN Meds:.ondansetron  (ZOFRAN ) IV  Antimicrobials: Anti-infectives (From admission, onward)    Start     Dose/Rate Route Frequency Ordered Stop   11/15/23 1030  fluconazole (DIFLUCAN) tablet 150 mg  Status:  Discontinued        150 mg Oral  Once 11/15/23 0936 11/15/23 0936   11/15/23 1030  fluconazole (DIFLUCAN) tablet 150 mg  Status:  Discontinued        150 mg Oral every 72 hours 11/15/23 0936 11/15/23 0936   11/15/23 1030  fluconazole (DIFLUCAN) tablet 150 mg        150 mg Oral  Once 11/15/23 0936 11/15/23 1038   11/15/23 1000  fluconazole (DIFLUCAN) tablet 150 mg  Status:  Discontinued        150 mg Oral every 72 hours 11/13/23 1623 11/13/23 1643   11/15/23 1000  linezolid (ZYVOX) tablet 600 mg        600 mg Oral Every 12 hours 11/15/23 0801     11/15/23 1000  fluconazole (DIFLUCAN) tablet 150 mg  Status:  Discontinued        150 mg Oral Daily 11/15/23 0802 11/15/23 0935    11/14/23 1100  vancomycin  (VANCOREADY) IVPB 750 mg/150 mL  Status:  Discontinued        750 mg 150 mL/hr over 60 Minutes Intravenous Every 24 hours 11/13/23 1905 11/15/23 0801   11/13/23 2200  ceFEPIme  (MAXIPIME ) 2 g in sodium chloride  0.9 % 100 mL IVPB        2 g 200 mL/hr over 30 Minutes Intravenous Every 12 hours 11/13/23 1905     11/13/23 1430  valACYclovir (VALTREX) tablet 1,000 mg  Status:  Discontinued        1,000 mg Oral 2 times daily 11/13/23 1423 11/15/23 0949   11/13/23 0845  ceFEPIme  (MAXIPIME ) 2 g in sodium chloride  0.9 % 100 mL IVPB        2 g 200 mL/hr  over 30 Minutes Intravenous  Once 11/13/23 0833 11/13/23 1051   11/13/23 0845  metroNIDAZOLE (FLAGYL) IVPB 500 mg        500 mg 100 mL/hr over 60 Minutes Intravenous  Once 11/13/23 0833 11/13/23 1051   11/13/23 0845  vancomycin  (VANCOCIN ) IVPB 1000 mg/200 mL premix        1,000 mg 200 mL/hr over 60 Minutes Intravenous  Once 11/13/23 0833 11/13/23 1222        I have personally reviewed the following labs and images: CBC: Recent Labs  Lab 11/13/23 0920 11/13/23 0921 11/15/23 0218  WBC 15.2*  --  10.1  NEUTROABS 13.1*  --   --   HGB 12.4 12.6  12.9 10.9*  HCT 39.3 37.0  38.0 34.0*  MCV 93.6  --  92.1  PLT 214  --  223   BMP &GFR Recent Labs  Lab 11/13/23 0920 11/13/23 0921 11/15/23 0218  NA 139 137  138 136  K 4.8 4.4  4.5 3.9  CL 110 113* 109  CO2 16*  --  18*  GLUCOSE 97 97 126*  BUN 18 22 22   CREATININE 1.08* 1.10* 1.40*  CALCIUM  8.9  --  8.7*  MG 1.8  --  1.8  PHOS  --   --  2.3*   Estimated Creatinine Clearance: 38.7 mL/min (A) (by C-G formula based on SCr of 1.4 mg/dL (H)). Liver & Pancreas: Recent Labs  Lab 11/13/23 0920 11/15/23 0218  AST 46* 44*  ALT 51* 44  ALKPHOS 216* 175*  BILITOT 1.0 0.9  PROT 6.2* 5.7*  ALBUMIN  3.0* 2.6*   No results for input(s): LIPASE, AMYLASE in the last 168 hours. No results for input(s): AMMONIA in the last 168 hours. Diabetic: No results  for input(s): HGBA1C in the last 72 hours. No results for input(s): GLUCAP in the last 168 hours. Cardiac Enzymes: No results for input(s): CKTOTAL, CKMB, CKMBINDEX, TROPONINI in the last 168 hours. Recent Labs    10/11/23 0935 10/12/23 0458  PROBNP 4,647.0* 4,823.0*   Coagulation Profile: Recent Labs  Lab 11/13/23 0920  INR 1.0   Thyroid Function Tests: Recent Labs    11/13/23 0918  TSH 0.445   Lipid Profile: No results for input(s): CHOL, HDL, LDLCALC, TRIG, CHOLHDL, LDLDIRECT in the last 72 hours. Anemia Panel: No results for input(s): VITAMINB12, FOLATE, FERRITIN, TIBC, IRON , RETICCTPCT in the last 72 hours. Urine analysis:    Component Value Date/Time   COLORURINE YELLOW 11/13/2023 0833   APPEARANCEUR CLEAR 11/13/2023 0833   LABSPEC >1.046 (H) 11/13/2023 0833   PHURINE 5.0 11/13/2023 0833   GLUCOSEU 50 (A) 11/13/2023 0833   HGBUR NEGATIVE 11/13/2023 0833   BILIRUBINUR NEGATIVE 11/13/2023 0833   KETONESUR 5 (A) 11/13/2023 0833   PROTEINUR 30 (A) 11/13/2023 0833   NITRITE NEGATIVE 11/13/2023 0833   LEUKOCYTESUR NEGATIVE 11/13/2023 0833   Sepsis Labs: Invalid input(s): PROCALCITONIN, LACTICIDVEN  Microbiology: Recent Results (from the past 240 hours)  Blood Culture (routine x 2)     Status: None (Preliminary result)   Collection Time: 11/13/23  8:38 AM   Specimen: BLOOD LEFT ARM  Result Value Ref Range Status   Specimen Description BLOOD LEFT ARM  Final   Special Requests   Final    BOTTLES DRAWN AEROBIC AND ANAEROBIC Blood Culture results may not be optimal due to an inadequate volume of blood received in culture bottles   Culture   Final    NO GROWTH 2 DAYS Performed at [facility], [address], [place], [place] [address]    Report Status PENDING  Incomplete  Blood Culture (routine x 2)     Status: None (Preliminary result)   Collection Time: 11/13/23  9:14 AM   Specimen: BLOOD RIGHT HAND  Result Value  Ref Range Status   Specimen Description BLOOD RIGHT HAND  Final   Special Requests   Final    BOTTLES DRAWN AEROBIC AND ANAEROBIC Blood Culture results may not be optimal due to an inadequate volume of blood received in culture bottles   Culture   Final    NO GROWTH 2 DAYS Performed at [facility], [address] [place], [place] [address]    Report Status PENDING  Incomplete  Resp panel by RT-PCR (RSV, Flu A&B, Covid) Anterior Nasal Swab     Status: None   Collection Time: 11/13/23  9:18 AM   Specimen: Anterior Nasal Swab  Result Value Ref Range Status   SARS Coronavirus 2 by RT PCR NEGATIVE NEGATIVE Final   Influenza A by PCR NEGATIVE NEGATIVE Final   Influenza B by PCR NEGATIVE NEGATIVE Final    Comment: (NOTE) The Xpert Xpress SARS-CoV-2/FLU/RSV plus assay is intended as an aid in the diagnosis of influenza from Nasopharyngeal swab specimens and should not be used as a sole basis for treatment. Nasal washings and aspirates are unacceptable for Xpert Xpress SARS-CoV-2/FLU/RSV testing.  Fact Sheet for Patients: bloggercourse.com  Fact Sheet for Healthcare Providers: seriousbroker.it  This test is not yet approved or cleared by the [place]  FDA and has been authorized for detection and/or diagnosis of SARS-CoV-2 by FDA under an Emergency Use Authorization (EUA). This EUA will remain in effect (meaning this test can be used) for the duration of the COVID-19 declaration under Section 564(b)(1) of the Act, 21 U.S.C. section 360bbb-3(b)(1), unless the authorization is terminated or revoked.     Resp Syncytial Virus by PCR NEGATIVE NEGATIVE Final    Comment: (NOTE) Fact Sheet for Patients: bloggercourse.com  Fact Sheet for Healthcare Providers: seriousbroker.it  This test is not yet approved or cleared by the [place]  FDA and has been authorized for detection  and/or diagnosis of SARS-CoV-2 by FDA under an Emergency Use Authorization (EUA). This EUA will remain in effect (meaning this test can be used) for the duration of the COVID-19 declaration under Section 564(b)(1) of the Act, 21 U.S.C. section 360bbb-3(b)(1), unless the authorization is terminated or revoked.  Performed at [facility], [address], [place], [place] [address]   MRSA Next Gen by PCR, Nasal     Status: None   Collection Time: 11/14/23  3:42 PM   Specimen: Nasal Mucosa; Nasal Swab  Result Value Ref Range Status   MRSA by PCR Next Gen NOT DETECTED NOT DETECTED Final    Comment: (NOTE) The GeneXpert MRSA Assay (FDA approved for NASAL specimens only), is one component of a comprehensive MRSA colonization surveillance program. It is not intended to diagnose MRSA infection nor to guide or monitor treatment for MRSA infections. Test performance is not FDA approved in patients less than 53 years old. Performed at [facility], [address], [place], [place] [address]     Radiology Studies: No results found.    Katrice Goel T. Darrin Koman [facility]  If 7PM-7AM, please contact night-coverage [email or web address] 11/15/2023, 1:40 PM

## 2023-11-16 ENCOUNTER — Encounter: Payer: Self-pay | Admitting: Pharmacist

## 2023-11-16 ENCOUNTER — Other Ambulatory Visit (HOSPITAL_COMMUNITY): Payer: Self-pay

## 2023-11-16 ENCOUNTER — Encounter: Payer: Self-pay | Admitting: Physician Assistant

## 2023-11-16 ENCOUNTER — Other Ambulatory Visit (HOSPITAL_BASED_OUTPATIENT_CLINIC_OR_DEPARTMENT_OTHER): Payer: Self-pay

## 2023-11-16 ENCOUNTER — Other Ambulatory Visit: Payer: Self-pay

## 2023-11-16 DIAGNOSIS — N762 Acute vulvitis: Secondary | ICD-10-CM | POA: Diagnosis not present

## 2023-11-16 DIAGNOSIS — N179 Acute kidney failure, unspecified: Secondary | ICD-10-CM | POA: Diagnosis not present

## 2023-11-16 DIAGNOSIS — N838 Other noninflammatory disorders of ovary, fallopian tube and broad ligament: Secondary | ICD-10-CM | POA: Diagnosis not present

## 2023-11-16 DIAGNOSIS — A419 Sepsis, unspecified organism: Secondary | ICD-10-CM | POA: Diagnosis not present

## 2023-11-16 LAB — CBC
HCT: 34.3 % — ABNORMAL LOW (ref 36.0–46.0)
Hemoglobin: 10.5 g/dL — ABNORMAL LOW (ref 12.0–15.0)
MCH: 29 pg (ref 26.0–34.0)
MCHC: 30.6 g/dL (ref 30.0–36.0)
MCV: 94.8 fL (ref 80.0–100.0)
Platelets: 214 K/uL (ref 150–400)
RBC: 3.62 MIL/uL — ABNORMAL LOW (ref 3.87–5.11)
RDW: 17 % — ABNORMAL HIGH (ref 11.5–15.5)
WBC: 6.7 K/uL (ref 4.0–10.5)
nRBC: 0 % (ref 0.0–0.2)

## 2023-11-16 LAB — GC/CHLAMYDIA PROBE AMP (~~LOC~~) NOT AT ARMC
Chlamydia: NEGATIVE
Comment: NEGATIVE
Comment: NORMAL
Neisseria Gonorrhea: NEGATIVE

## 2023-11-16 LAB — RENAL FUNCTION PANEL
Albumin: 2.4 g/dL — ABNORMAL LOW (ref 3.5–5.0)
Anion gap: 10 (ref 5–15)
BUN: 19 mg/dL (ref 8–23)
CO2: 20 mmol/L — ABNORMAL LOW (ref 22–32)
Calcium: 8.7 mg/dL — ABNORMAL LOW (ref 8.9–10.3)
Chloride: 108 mmol/L (ref 98–111)
Creatinine, Ser: 1.31 mg/dL — ABNORMAL HIGH (ref 0.44–1.00)
GFR, Estimated: 44 mL/min — ABNORMAL LOW (ref >60)
Glucose, Bld: 106 mg/dL — ABNORMAL HIGH (ref 70–99)
Phosphorus: 3.1 mg/dL (ref 2.5–4.6)
Potassium: 4.1 mmol/L (ref 3.5–5.1)
Sodium: 138 mmol/L (ref 135–145)

## 2023-11-16 LAB — MAGNESIUM: Magnesium: 1.9 mg/dL (ref 1.7–2.4)

## 2023-11-16 MED ORDER — LINEZOLID 600 MG PO TABS
600.0000 mg | ORAL_TABLET | Freq: Two times a day (BID) | ORAL | 0 refills | Status: DC
  Filled 2023-11-16: qty 24, 12d supply, fill #0

## 2023-11-16 MED ORDER — FLUCONAZOLE 100 MG PO TABS
100.0000 mg | ORAL_TABLET | Freq: Every day | ORAL | 0 refills | Status: DC
  Filled 2023-11-16: qty 14, 14d supply, fill #0

## 2023-11-16 MED ORDER — FLUCONAZOLE 100 MG PO TABS
100.0000 mg | ORAL_TABLET | Freq: Every day | ORAL | Status: DC
  Administered 2023-11-16 – 2023-11-18 (×3): 100 mg via ORAL
  Filled 2023-11-16 (×3): qty 1

## 2023-11-16 MED ORDER — ACETAMINOPHEN 325 MG PO TABS
650.0000 mg | ORAL_TABLET | Freq: Four times a day (QID) | ORAL | Status: DC | PRN
  Administered 2023-11-16 – 2023-11-17 (×2): 650 mg via ORAL
  Filled 2023-11-16 (×2): qty 2

## 2023-11-16 MED ORDER — METOPROLOL SUCCINATE ER 50 MG PO TB24
50.0000 mg | ORAL_TABLET | Freq: Every day | ORAL | 0 refills | Status: AC
  Filled 2023-11-16 – 2024-01-15 (×2): qty 90, 90d supply, fill #0

## 2023-11-16 NOTE — TOC Progression Note (Signed)
 Transition of Care [facility]) - Progression Note    Patient Details  Name: Lynnann Knudsen MRN: [id number] Date of Birth: 1955-04-24  [facility] CM/SW Contact  Claretta Deed Phone Number: 11/16/2023, 1:40 PM  Clinical Narrative:     [facility] Appeal) Detailed Notice of Discharge letter created and saved: Yes Dwan Stain)   [facility]) ROI Document Created: Yes Dwan Stain) [facility] ([facility]) appeal documents uploaded to [facility]) site: Yes Cherilyn Deed)

## 2023-11-16 NOTE — Progress Notes (Signed)
 Patient appealing discharge.

## 2023-11-16 NOTE — Discharge Summary (Signed)
 Physician Discharge Summary  Christine Jacobson FMW:[id number] DOB: May 17, 1955 DOA: 11/13/2023  PCP: [name], NP  Admit date: 11/13/2023 Discharge date: 11/16/23  Admitted From: Home. Disposition: Home. Recommendations for Outpatient Follow-up:  Outpatient follow-up with PCP in 1 to 2 weeks. Needs outpatient follow-up with gynecology for ovarian mass. Check CMP and CBC at follow-up. Please follow up on the following pending results: None  Home Health: No need identified. Equipment/Devices: No need identified.  Discharge Condition: Stable CODE STATUS: Full code Diet Orders (From admission, onward)     Start     Ordered   11/13/23 1603  Diet 2 gram sodium Room service appropriate? Yes; Fluid consistency: Thin  Diet effective now       Question Answer Comment  Room service appropriate? Yes   Fluid consistency: Thin      11/13/23 1602             Follow-up Information     [name], NP. Schedule an appointment as soon as possible for a visit in 1 week(s).   Specialty: Nurse Practitioner Why: Nov. 3, 2025 @1 :20 PM Pick up @12 :40 PM Contact information: [address] [place] [address] [phone]                 Hospital course 68 year old F with PMH of HOCM, HFpEF, HTN and HLD advised to go to ED by PCP after she reported severe vaginal yeast infection despite treatment with Diflucan, and admitted with sepsis due to vulvar cellulitis.   In ED, tachycardic and tachypneic.  WBC 15.  Lactic acid 3.8 and improved to 2.2.  COVID-19, influenza and RSV PCR nonreactive.  CT chest, abdomen and pelvis showed pulmonary edema and possible ovarian neoplasm.  GYN consulted.  Patient was started on amiodarone drip and broad-spectrum antibiotics and admitted.   Blood cultures NGTD.  MRSA PCR screen negative.  Evaluated by GYN.  Antibiotic de-escalated to Zyvox for 14 days total after consultation with ID.  Gynecology also recommended continuing  Diflucan for yeast infection.   On the day of discharge, patient's vulvar cellulitis improved.  She is discharged on Zyvox for 12 more days and p.o. Diflucan for 14 more days to complete treatment course.  Discontinued Jardiance  due to risk of recurrent infection in this area.  Patient was evaluated by PT/OT and no need identified.  Patient has been reminded to follow-up with gynecology for further evaluation on ovarian masses.  See individual problem list below for more.   Problems addressed during this hospitalization Severe sepsis due to vulvar cellulitis/yeast infection: Present on arrival.  Has tachycardia, tachypnea, leukocytosis and lactic acidosis.  She was placed on broad-spectrum antibiotics.  Patient was on Jardiance  which could increase her risk of infection. -Antibiotic escalated to Zyvox for a total of 2 weeks per discussion between ID and GYN. -P.o. Diflucan 100 mg daily per gynecology -Discontinue Jardiance  indefinitely. -Outpatient follow-up with PCP and gynecology   Acute on chronic diastolic CHF, HOCM: TTE on 9/25 with LVEF of > 75, HOCM, severe LAE.  BNP elevated to 900s.  Imaging showed pulmonary edema and cardiomegaly.  No cardiopulmonary symptoms but extremely fatigued.  Briefly diuresed with IV Lasix .  Transition to p.o.  Appears euvolemic on exam.  Mild AKI that is improving. -Discontinued Jardiance  due to vulvar cellulitis/infection -Recommend holding Lasix  for 1 week.   Paroxysmal with RVR-mild RVR to 120s on arrival.  Started on amiodarone infusion.  Suspect her RVR was mainly driven by #1.  Initially started on amiodarone.  Transition to p.o. Toprol -XL. -Increased Toprol -XL to 50 mg daily -Continue Eliquis  for anticoagulation.   AKI: Baseline creatinine 1.0-1.1.  AKI improved. Recent Labs    05/13/23 0314 10/11/23 0935 10/12/23 0458 10/13/23 0431 10/14/23 0514 10/15/23 0504 11/13/23 0920 11/13/23 0921 11/15/23 0218 11/16/23 0309  BUN 22 24* 27* 33* 31*  34* 18 22 22 19   CREATININE 1.42* 1.01* 1.21* 1.23* 1.16* 1.27* 1.08* 1.10* 1.40* 1.31*  -Discontinued Jardiance  as above. -Patient to hold Lasix  for 1 week.   Physical deconditioning/fatigue - No need identified by PT/OT.   Mood disorder: Stable. -Continue home Lexapro . -Outpatient follow-up.   Ovarian mass c/f neoplasm - GYn recommended outpatient follow-up.   Chronic venous insufficiency stasis dermatitis - Continue leg evaluation and wound care.   Class I obesity Body mass index is 30.9 kg/m.           Consultations: Gynecology Infectious disease over the phone  Time spent 35  minutes  Vital signs Vitals:   11/15/23 1959 11/15/23 2329 11/16/23 0303 11/16/23 0736  BP: 128/84 115/79 124/80 103/74  Pulse: 75  85 85  Temp: 98.1 F (36.7 C) 98.3 F (36.8 C) 98.3 F (36.8 C) 98.1 F (36.7 C)  Resp: 16 14 15  (!) 21  Height:      Weight:      SpO2: 90% 91% 90% 94%  TempSrc: Oral Oral Oral Oral  BMI (Calculated):         Discharge exam  GENERAL: No apparent distress.  Nontoxic. HEENT: MMM.  Vision and hearing grossly intact.  NECK: Supple.  No apparent JVD.  RESP:  No IWOB.  Fair aeration bilaterally. CVS:  RRR. Heart sounds normal.  ABD/GI/GU: BS+. Abd soft, NTND.  MSK/EXT:  Moves extremities.  Stasis dermatitis/chronic venous insufficiency. SKIN: As above. NEURO: Awake and alert. Oriented appropriately.  No apparent focal neuro deficit. PSYCH: Calm.  No distress or agitation.  Discharge Instructions Discharge Instructions     Discharge instructions   Complete by: As directed    It has been a pleasure taking care of you!  You were hospitalized due to vulvar infection for which you have been started on antibiotics.  Your symptoms improved.  We are discharging you on antibiotics to complete treatment course.  Note that we have stopped Jardiance  since it could increase your risk of vulvar infection.  Review your new medication list and the directions on  your medications before you take them.  Follow-up with your primary care doctor in 1 to 2 weeks or sooner if needed.  We also recommend follow-up with gynecology for the ovarian mass.   Take care,   Discharge wound care:   Complete by: As directed    Wound care  Daily      Comments: Cleanse B lower leg wounds with NS, apply Xeroform gauze Soila [phone]) to wound beds daily, cover with ABD pads and secure with Kerlix roll gauze preferably wrapped from right above toes to right below knees.  Apply Ace bandage wrapped in same fashion for light compression as patient will allow   Increase activity slowly   Complete by: As directed       Allergies as of 11/16/2023       Reactions   Porcine (pork) Protein-containing Drug Products Other (See Comments)   Religious preference        Medication List     PAUSE taking these medications    furosemide  40 MG tablet Wait to take  this until: November 23, 2023 Commonly known as: Lasix  Take 1 tablet (40 mg total) by mouth in the morning.   Potassium Chloride  ER 20 MEQ Tbcr Wait to take this until: November 23, 2023 Take 1 tablet (20 mEq total) by mouth every other day.       STOP taking these medications    Jardiance  10 MG Tabs tablet Generic drug: empagliflozin        TAKE these medications    acetaminophen  325 MG tablet Commonly known as: TYLENOL  Take 2 tablets (650 mg total) by mouth every 6 (six) hours as needed for mild pain (pain score 1-3) or fever (or Fever >/= 101).   allopurinol  100 MG tablet Commonly known as: ZYLOPRIM  Take 1 tablet (100 mg total) by mouth daily.   apixaban  5 MG Tabs tablet Commonly known as: Eliquis  Take 1 tablet (5 mg total) by mouth 2 (two) times daily.   ascorbic acid  500 MG tablet Commonly known as: VITAMIN C  Take 1 tablet (500 mg total) by mouth daily for 30 days   atorvastatin  80 MG tablet Commonly known as: LIPITOR Take 1 tablet (80 mg total) by mouth daily.   escitalopram  5 MG  tablet Commonly known as: LEXAPRO  Take 1 tablet (5 mg total) by mouth in the morning for moods.   ezetimibe  10 MG tablet Commonly known as: Zetia  Take 1 tablet (10 mg total) by mouth daily for cholesterol.   fluconazole 100 MG tablet Commonly known as: DIFLUCAN Take 1 tablet (100 mg total) by mouth daily for 14 days. What changed:  medication strength how much to take when to take this   linezolid 600 MG tablet Commonly known as: ZYVOX Take 1 tablet (600 mg total) by mouth every 12 (twelve) hours for 12 days.   metoprolol  succinate 50 MG 24 hr tablet Commonly known as: TOPROL -XL Take 1 tablet (50 mg total) by mouth daily. Take with or immediately following a meal. What changed:  medication strength how much to take additional instructions   pantoprazole  40 MG tablet Commonly known as: PROTONIX  Take 1 tablet (40 mg total) by mouth daily.   Zinc  Sulfate 220 (50 Zn) MG Tabs Take 1 tablet (220 mg total) by mouth daily for 30 days               Discharge Care Instructions  (From admission, onward)           Start     Ordered   11/16/23 0000  Discharge wound care:       Comments: Wound care  Daily      Comments: Cleanse B lower leg wounds with NS, apply Xeroform gauze Soila [phone]) to wound beds daily, cover with ABD pads and secure with Kerlix roll gauze preferably wrapped from right above toes to right below knees.  Apply Ace bandage wrapped in same fashion for light compression as patient will allow   11/16/23 0903             Procedures/Studies: None   DG Tibia/Fibula Right Result Date: 11/13/2023 EXAM: _VIEWS_ VIEW(S) XRAY OF THE _LATERALITY_ TIBIA AND FIBULA 11/13/2023 02:35:48 PM COMPARISON: None available. CLINICAL HISTORY: wound FINDINGS: BONES AND JOINTS: No acute fracture. No focal osseous lesion. No joint dislocation. SOFT TISSUES: Diffuse subcutaneous soft tissue edema. IMPRESSION: 1. Diffuse subcutaneous soft tissue edema. 2. No acute  osseous findings. Electronically signed by: Waddell Calk MD 11/13/2023 03:37 PM EDT RP Workstation: [id number]   CT CHEST ABDOMEN PELVIS W CONTRAST Result Date: 11/13/2023  CLINICAL DATA:  Sepsis * Tracking Code: BO * EXAM: CT CHEST, ABDOMEN, AND PELVIS WITH CONTRAST TECHNIQUE: Multidetector CT imaging of the chest, abdomen and pelvis was performed following the standard protocol during bolus administration of intravenous contrast. RADIATION DOSE REDUCTION: This exam was performed according to the departmental dose-optimization program which includes automated exposure control, adjustment of the mA and/or kV according to patient size and/or use of iterative reconstruction technique. CONTRAST:  75mL OMNIPAQUE  IOHEXOL  350 MG/ML SOLN COMPARISON:  CT chest angiogram, 05/10/2023 FINDINGS: CT CHEST FINDINGS Cardiovascular: Scattered aortic atherosclerosis. Cardiomegaly. Three-vessel coronary artery calcifications. No pericardial effusion. Mediastinum/Nodes: Unchanged prominent mediastinal lymph nodes, high right paratracheal node measuring 1.6 x 0.8 cm (series 3, image 13). Heterogeneous nodular thyroid and isthmus (series 3, image 12). Trachea, and esophagus demonstrate no significant findings. Lungs/Pleura: Diffuse interlobular septal thickening, mosaic attenuation of the airspaces, and diffuse bilateral bronchial wall thickening. Small benign calcified nodule of the medial right lung base requiring no further follow-up or characterization (series 5, image 110). No pleural effusion or pneumothorax. Musculoskeletal: No chest wall abnormality. No acute osseous findings. CT ABDOMEN PELVIS FINDINGS Hepatobiliary: No solid liver abnormality is seen. No gallstones, gallbladder wall thickening, or biliary dilatation. Pancreas: Unremarkable. No pancreatic ductal dilatation or surrounding inflammatory changes. Spleen: Normal in size without significant abnormality. Adrenals/Urinary Tract: Adrenal glands are unremarkable.  Kidneys are normal, without renal calculi, solid lesion, or hydronephrosis. Bladder is unremarkable. Stomach/Bowel: Stomach is within normal limits. Appendix appears normal. No evidence of bowel wall thickening, distention, or inflammatory changes. Vascular/Lymphatic: Aortic atherosclerosis. No enlarged abdominal or pelvic lymph nodes. Reproductive: Large cystic lesions of the ovaries, on the right, situated in the right lower quadrant measuring 12.2 x 8.1 x 8.4 cm (series 3, image 93) and arising from the left ovary situated in the posterior pelvis with a visible internal septation measuring 9.0 x 7.9 x 6.6 cm (series 3, image 100). Other: No abdominal wall hernia or abnormality. No ascites. Musculoskeletal: No acute osseous findings. IMPRESSION: 1. Diffuse interlobular septal thickening, mosaic attenuation of the airspaces, and diffuse bilateral bronchial wall thickening, consistent with pulmonary edema. 2. Cardiomegaly and coronary artery disease. 3. Large cystic lesions of the ovaries, on the right, situated in the right lower quadrant measuring 12.2 x 8.1 x 8.4 cm and arising from the left ovary situated in the posterior pelvis with a visible internal septation measuring 9.0 x 7.9 x 6.6 cm. 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 Recommend gynecologic referral and consideration of further evaluation with pelvic ultrasound and/or contrast enhanced pelvic MRI. 4. Unchanged prominent mediastinal lymph nodes, nonspecific and likely reactive. 5. Heterogeneous nodular thyroid and isthmus. Recommend dedicated thyroid ultrasound on a nonemergent, outpatient basis if and when clinically appropriate. Aortic Atherosclerosis (ICD10-I70.0). Electronically Signed   By: Marolyn JONETTA Jaksch M.D.   On: 11/13/2023 13:41   DG Chest Port 1 View Result Date: 11/13/2023 EXAM: 1 VIEW(S) XRAY OF THE CHEST 11/13/2023 08:47:00 AM COMPARISON: 10/11/2023 CLINICAL HISTORY: Reason for exam: questionable sepsis; SHOB/vaginal  bleeding. Upon ems arrival pt tachypnea 22, Afib rvr 120-160. EMS reports edema throughout , weeping edema r leg. FINDINGS: LUNGS AND PLEURA: No focal pulmonary opacity. No pulmonary edema. No pleural effusion. No pneumothorax. HEART AND MEDIASTINUM: Stable cardiomegaly. BONES AND SOFT TISSUES: No acute osseous abnormality. IMPRESSION: 1. Stable cardiomegaly. 2. No acute lung disease. Electronically signed by: Norleen Kil MD 11/13/2023 09:18 AM EDT RP Workstation: [id number]       The results of significant diagnostics from this hospitalization (including  imaging, microbiology, ancillary and laboratory) are listed below for reference.     Microbiology: Recent Results (from the past 240 hours)  Blood Culture (routine x 2)     Status: None (Preliminary result)   Collection Time: 11/13/23  8:38 AM   Specimen: BLOOD LEFT ARM  Result Value Ref Range Status   Specimen Description BLOOD LEFT ARM  Final   Special Requests   Final    BOTTLES DRAWN AEROBIC AND ANAEROBIC Blood Culture results may not be optimal due to an inadequate volume of blood received in culture bottles   Culture   Final    NO GROWTH 3 DAYS Performed at [facility], [address], [place], [place] [address]    Report Status PENDING  Incomplete  Blood Culture (routine x 2)     Status: None (Preliminary result)   Collection Time: 11/13/23  9:14 AM   Specimen: BLOOD RIGHT HAND  Result Value Ref Range Status   Specimen Description BLOOD RIGHT HAND  Final   Special Requests   Final    BOTTLES DRAWN AEROBIC AND ANAEROBIC Blood Culture results may not be optimal due to an inadequate volume of blood received in culture bottles   Culture   Final    NO GROWTH 3 DAYS Performed at [facility], [address], [place], [place] [address]    Report Status PENDING  Incomplete  Resp panel by RT-PCR (RSV, Flu A&B, Covid) Anterior Nasal Swab     Status: None   Collection Time: 11/13/23  9:18 AM   Specimen: Anterior Nasal  Swab  Result Value Ref Range Status   SARS Coronavirus 2 by RT PCR NEGATIVE NEGATIVE Final   Influenza A by PCR NEGATIVE NEGATIVE Final   Influenza B by PCR NEGATIVE NEGATIVE Final    Comment: (NOTE) The Xpert Xpress SARS-CoV-2/FLU/RSV plus assay is intended as an aid in the diagnosis of influenza from Nasopharyngeal swab specimens and should not be used as a sole basis for treatment. Nasal washings and aspirates are unacceptable for Xpert Xpress SARS-CoV-2/FLU/RSV testing.  Fact Sheet for Patients: bloggercourse.com  Fact Sheet for Healthcare Providers: seriousbroker.it  This test is not yet approved or cleared by the [place]  FDA and has been authorized for detection and/or diagnosis of SARS-CoV-2 by FDA under an Emergency Use Authorization (EUA). This EUA will remain in effect (meaning this test can be used) for the duration of the COVID-19 declaration under Section 564(b)(1) of the Act, 21 U.S.C. section 360bbb-3(b)(1), unless the authorization is terminated or revoked.     Resp Syncytial Virus by PCR NEGATIVE NEGATIVE Final    Comment: (NOTE) Fact Sheet for Patients: bloggercourse.com  Fact Sheet for Healthcare Providers: seriousbroker.it  This test is not yet approved or cleared by the [place]  FDA and has been authorized for detection and/or diagnosis of SARS-CoV-2 by FDA under an Emergency Use Authorization (EUA). This EUA will remain in effect (meaning this test can be used) for the duration of the COVID-19 declaration under Section 564(b)(1) of the Act, 21 U.S.C. section 360bbb-3(b)(1), unless the authorization is terminated or revoked.  Performed at [facility], [address], [place], [place] [address]   MRSA Next Gen by PCR, Nasal     Status: None   Collection Time: 11/14/23  3:42 PM   Specimen: Nasal Mucosa; Nasal Swab  Result Value Ref  Range Status   MRSA by PCR Next Gen NOT DETECTED NOT DETECTED Final  Comment: (NOTE) The GeneXpert MRSA Assay (FDA approved for NASAL specimens only), is one component of a comprehensive MRSA colonization surveillance program. It is not intended to diagnose MRSA infection nor to guide or monitor treatment for MRSA infections. Test performance is not FDA approved in patients less than 4 years old. Performed at [facility], [address] [place], [place] [address]      Labs:  CBC: Recent Labs  Lab 11/13/23 0920 11/13/23 0921 11/15/23 0218 11/16/23 0309  WBC 15.2*  --  10.1 6.7  NEUTROABS 13.1*  --   --   --   HGB 12.4 12.6  12.9 10.9* 10.5*  HCT 39.3 37.0  38.0 34.0* 34.3*  MCV 93.6  --  92.1 94.8  PLT 214  --  223 214   BMP &GFR Recent Labs  Lab 11/13/23 0920 11/13/23 0921 11/15/23 0218 11/16/23 0309  NA 139 137  138 136 138  K 4.8 4.4  4.5 3.9 4.1  CL 110 113* 109 108  CO2 16*  --  18* 20*  GLUCOSE 97 97 126* 106*  BUN 18 22 22 19   CREATININE 1.08* 1.10* 1.40* 1.31*  CALCIUM  8.9  --  8.7* 8.7*  MG 1.8  --  1.8 1.9  PHOS  --   --  2.3* 3.1   Estimated Creatinine Clearance: 41.3 mL/min (A) (by C-G formula based on SCr of 1.31 mg/dL (H)). Liver & Pancreas: Recent Labs  Lab 11/13/23 0920 11/15/23 0218 11/16/23 0309  AST 46* 44*  --   ALT 51* 44  --   ALKPHOS 216* 175*  --   BILITOT 1.0 0.9  --   PROT 6.2* 5.7*  --   ALBUMIN  3.0* 2.6* 2.4*   No results for input(s): LIPASE, AMYLASE in the last 168 hours. No results for input(s): AMMONIA in the last 168 hours. Diabetic: No results for input(s): HGBA1C in the last 72 hours. No results for input(s): GLUCAP in the last 168 hours. Cardiac Enzymes: No results for input(s): CKTOTAL, CKMB, CKMBINDEX, TROPONINI in the last 168 hours. Recent Labs    10/11/23 0935 10/12/23 0458  PROBNP 4,647.0* 4,823.0*   Coagulation Profile: Recent Labs  Lab 11/13/23 0920  INR 1.0    Thyroid Function Tests: No results for input(s): TSH, T4TOTAL, FREET4, T3FREE, THYROIDAB in the last 72 hours. Lipid Profile: No results for input(s): CHOL, HDL, LDLCALC, TRIG, CHOLHDL, LDLDIRECT in the last 72 hours. Anemia Panel: No results for input(s): VITAMINB12, FOLATE, FERRITIN, TIBC, IRON , RETICCTPCT in the last 72 hours. Urine analysis:    Component Value Date/Time   COLORURINE YELLOW 11/13/2023 0833   APPEARANCEUR CLEAR 11/13/2023 0833   LABSPEC >1.046 (H) 11/13/2023 0833   PHURINE 5.0 11/13/2023 0833   GLUCOSEU 50 (A) 11/13/2023 0833   HGBUR NEGATIVE 11/13/2023 0833   BILIRUBINUR NEGATIVE 11/13/2023 0833   KETONESUR 5 (A) 11/13/2023 0833   PROTEINUR 30 (A) 11/13/2023 0833   NITRITE NEGATIVE 11/13/2023 0833   LEUKOCYTESUR NEGATIVE 11/13/2023 0833   Sepsis Labs: Invalid input(s): PROCALCITONIN, LACTICIDVEN   SIGNED:  Mignon ONEIDA Bump, MD  [facility] 11/16/2023, 12:04 PM

## 2023-11-16 NOTE — Progress Notes (Signed)
 Heart Failure Navigator Progress Note  Assessed for Heart & Vascular TOC clinic readiness.  Patient does not meet criteria due to EF > 75 %, Per MD patient will follow up with her PCP in 1-2 weeks and Gynecology for Ovarian mass. No HF TOC. .   Navigator will sign off at this time.   Stephane Haddock, BSN, Scientist, Clinical (histocompatibility And Immunogenetics) Only

## 2023-11-16 NOTE — Progress Notes (Signed)
 OT Cancellation Note  Patient Details Name: [name] MRN: [id number] DOB: [birth year]   Cancelled Treatment:    Reason Eval/Treat Not Completed: Patient declined, no reason specified Pt declined OT eval d/t just finished eating. Pt agreeable for OT to check back tomorrow AM.  Mliss Fish 11/16/2023, 2:18 PM

## 2023-11-16 NOTE — TOC Progression Note (Signed)
 Transition of Care [facility]) - Progression Note    Patient Details  Name: Christine Jacobson MRN: [id number] Date of Birth: 1955-10-23  Transition of [facility]) CM/SW Contact  Roxie KANDICE Stain, RN Phone Number: 11/16/2023, 11:58 AM  Clinical Narrative:     [facility])    11:50   Detailed notice of discharge given to patient.              HINN 12 signed by patient and a copy delivered.         Expected Discharge Plan: Home/Self Care Barriers to Discharge: Barriers Resolved               Expected Discharge Plan and Services       Living arrangements for the past 2 months: Single Family Home Expected Discharge Date: 11/16/23                                     Social Drivers of Health (SDOH) Interventions SDOH Screenings   Food Insecurity: No Food Insecurity (11/14/2023)  Housing: Low Risk  (11/14/2023)  Transportation Needs: No Transportation Needs (11/14/2023)  Utilities: Not At Risk (11/14/2023)  Depression (PHQ2-9): Low Risk  (10/26/2021)  Financial Resource Strain: Low Risk  (12/03/2021)  Social Connections: Unknown (11/14/2023)  Recent Concern: Social Connections - Socially Isolated (11/14/2023)  Tobacco Use: Medium Risk (11/13/2023)    Readmission Risk Interventions    11/16/2023   10:02 AM 05/12/2023    4:15 PM 12/28/2022   11:28 AM  Readmission Risk Prevention Plan  Transportation Screening Complete Complete Complete  PCP or Specialist Appt within 3-5 Days Complete  Complete  HRI or Home Care Consult Complete  Complete  Social Work Consult for Recovery Care Planning/Counseling Complete  Complete  Palliative Care Screening Not Applicable  Not Applicable  Medication Review Oceanographer)  Complete Complete  HRI or Home Care Consult  Complete   Palliative Care Screening  Not Applicable   Skilled Nursing Facility  Not Applicable

## 2023-11-16 NOTE — Progress Notes (Signed)
 Gynecology Consult Progress Note  Admission Date: 11/13/2023 Current Date: 11/16/2023 11:44 AM  Christine Jacobson is a 68 y.o. G0 HD#4 admitted for +SIRS criteria with vulvar cellulitis noted along with large b/l adnexal masses   History complicated by: Patient Active Problem List   Diagnosis Date Noted   Vulvar cellulitis 11/13/2023   Ovarian mass, right 11/13/2023   Ovarian mass, left 11/13/2023   Sepsis (HCC) 11/13/2023   Coronary artery calcification seen on CAT scan 10/13/2023   Elevated troponin 10/12/2023   Abnormal stress test 10/12/2023   Acute diastolic (congestive) heart failure (HCC) 10/11/2023   Persistent atrial fibrillation (HCC) 12/28/2022   Acute on chronic diastolic CHF (congestive heart failure) (HCC) 12/27/2022   Major depressive disorder, 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   MDD (major depressive disorder) 09/29/2022   Anxiety 09/16/2022   Acute exacerbation of CHF (congestive heart failure) (HCC) 09/15/2022   Atrial fibrillation by electrocardiogram (HCC) 09/15/2022   Leg swelling 09/15/2022   GIB (gastrointestinal bleeding) 08/05/2022   Acquired thrombophilia 08/05/2022   Chronic anticoagulation 08/05/2022   Acute blood loss anemia 08/04/2022   Cellulitis of both lower extremities 07/13/2022   Hypocarbia 07/13/2022   Normocytic anemia 07/13/2022   Cellulitis 06/02/2022   Sepsis due to cellulitis (HCC) 06/02/2022   AKI (acute kidney injury) 06/02/2022   Hyponatremia 06/02/2022   Dehydration 06/02/2022   Metabolic acidosis 06/02/2022   Pulmonary HTN (HCC)    Cellulitis and abscess of left lower extremity 10/13/2021   PAF (paroxysmal atrial fibrillation) (HCC) 07/28/2021   Obesity (BMI 30-39.9)    Liver function test abnormality    Primary hypertension    Chronic diastolic heart failure (HCC) 12/15/2016   HOCM (hypertrophic obstructive cardiomyopathy) (HCC) 01/22/2016   Lower extremity edema 09/17/2014   History of syncope  12/05/2013   Syncope 12/05/2013   Pneumonia 11/23/2012   Shortness of breath 07/11/2011   Hypertension 11/23/2010   Chest pain of uncertain etiology 10/18/2010   Murmur 10/18/2010   Palpitations 09/23/2010   Mild hyperlipidemia    Elevated BP    Hyperprolactinemia (HCC)    Pyoderma gangrenosa (HCC)    ROS and patient/family/surgical history, located on admission H&P note dated 11/13/2023, have been reviewed, and there are no changes except as noted below  Yesterday/Overnight Events:  none  Subjective:  Patient feels that GU area feels to be improving  Objective:     Current Vital Signs 24h Vital Sign Ranges  T 98.1 F (36.7 C) Temp  Avg: 98.2 F (36.8 C)  Min: 98.1 F (36.7 C)  Max: 98.3 F (36.8 C)  BP 103/74 BP  Min: 103/74  Max: 128/84  HR 85 Pulse  Avg: 81.3  Min: 75  Max: 85  RR (!) 21 Resp  Avg: 16.6  Min: 14  Max: 21  SaO2 94 % Room Air SpO2  Avg: 91.6 %  Min: 90 %  Max: 94 %       24 Hour I/O Current Shift I/O  Time Ins Outs 10/29 0701 - 10/30 0700 In: 236 [P.O.:236] Out: 775 [Urine:775] 10/30 0701 - 10/30 1900 In: 120 [P.O.:120] Out: -    Patient Vitals for the past 12 hrs:  BP Temp Temp src Pulse Resp SpO2  11/16/23 0736 103/74 98.1 F (36.7 C) Oral 85 (!) 21 94 %  11/16/23 0303 124/80 98.3 F (36.8 C) Oral 85 15 90 %   Physical exam: General appearance: alert, cooperative, and appears stated age Abdomen: soft, nttp, ?  mildly distended GU: Diffuse erythema involving inferior portions of vulva, perianal area and bilateral inner thighs concerning for possible candidal infection. No discrete vulvar collections or edema noted.    No gross VB, foley in place with normal appearing UOP Lungs: no respiratory distress Skin: warm and dry, see above Psych: appropriate Neurologic: Grossly normal  Medications Current Facility-Administered Medications  Medication Dose Route Frequency Provider Last Rate Last Admin   acetaminophen  (TYLENOL ) tablet 650 mg  650  mg Oral Q6H PRN Gonfa, Taye T, MD   650 mg at 11/16/23 9191   apixaban  (ELIQUIS ) tablet 5 mg  5 mg Oral BID Georgina Basket, MD   5 mg at 11/16/23 9191   atorvastatin  (LIPITOR) tablet 80 mg  80 mg Oral Daily Georgina Basket, MD   80 mg at 11/16/23 9191   Chlorhexidine  Gluconate Cloth 2 % PADS 6 each  6 each Topical Daily Gherghe, Costin M, MD   6 each at 11/15/23 1000   escitalopram  (LEXAPRO ) tablet 5 mg  5 mg Oral Daily Georgina Basket, MD   5 mg at 11/16/23 9191   linezolid (ZYVOX) tablet 600 mg  600 mg Oral Q12H Gonfa, Taye T, MD   600 mg at 11/16/23 9191   metoprolol  succinate (TOPROL -XL) 24 hr tablet 50 mg  50 mg Oral Daily Gonfa, Taye T, MD   50 mg at 11/16/23 9191   nystatin ointment (MYCOSTATIN)   Topical BID Izell Harari, MD   Given at 11/16/23 0809   ondansetron  (ZOFRAN ) injection 4 mg  4 mg Intravenous Q6H PRN Gonfa, Taye T, MD   4 mg at 11/15/23 1425   pantoprazole  (PROTONIX ) EC tablet 40 mg  40 mg Oral Daily Georgina Basket, MD   40 mg at 11/16/23 9191    Labs  BCx: NGTD Negative: hsv 1 and 2 IgG    Latest Ref Rng & Units 11/16/2023    3:09 AM 11/15/2023    2:18 AM 11/13/2023    9:21 AM  CBC  WBC 4.0 - 10.5 K/uL 6.7  10.1    Hemoglobin 12.0 - 15.0 g/dL 89.4  89.0  87.3    87.0   Hematocrit 36.0 - 46.0 % 34.3  34.0  37.0    38.0   Platelets 150 - 400 K/uL 214  223     Radiology No new imaging  Assessment & Plan:  Patient improving *Vulvar cellulitis:Has improved overall based on previous examinations.  Worried about yeast infection and its spread to the inner thighs. Continue Nystatin and recommend Diflucan 100 mg daily for at least 14 days. As my partner said yesterday, foley is okay to come out, due to increased infection risk. Continue Zyvox for a 14d total course as per ID recommendations. *Adnexal masses: outpatient gyn onc referral order placed for late November.  Code Status: Full Code  Total time taking care of the patient was 20 minutes, with greater than 50% of  the time spent in face to face interaction with the patient.  Gloris Hugger, MD Attending Center for [facility]) GYN Consult Phone: [phone] (M-F, 0800-1700) & [phone] (Off hours, weekends, holidays)

## 2023-11-16 NOTE — TOC Transition Note (Signed)
 Transition of Care [facility]) - Discharge Note   Patient Details  Name: Christine Jacobson MRN: [id number] Date of Birth: 1955/09/26  Transition of Care [facility]) CM/SW Contact:  Roxie KANDICE Stain, RN Phone Number: 11/16/2023, 10:03 AM   Clinical Narrative:    Christine Jacobson is stable to discharge home. Follow up apt on AVS. No ICM (Inpatient Care Management) needs at this time.    Final next level of care: Home/Self Care Barriers to Discharge: Barriers Resolved   Patient Goals and CMS Choice Patient states their goals for this hospitalization and ongoing recovery are:: return home          Discharge Placement             Home          Discharge Plan and Services Additional resources added to the After Visit Summary for                                       Social Drivers of Health (SDOH) Interventions SDOH Screenings   Food Insecurity: No Food Insecurity (11/14/2023)  Housing: Low Risk  (11/14/2023)  Transportation Needs: No Transportation Needs (11/14/2023)  Utilities: Not At Risk (11/14/2023)  Depression (PHQ2-9): Low Risk  (10/26/2021)  Financial Resource Strain: Low Risk  (12/03/2021)  Social Connections: Unknown (11/14/2023)  Recent Concern: Social Connections - Socially Isolated (11/14/2023)  Tobacco Use: Medium Risk (11/13/2023)     Readmission Risk Interventions    11/16/2023   10:02 AM 05/12/2023    4:15 PM 12/28/2022   11:28 AM  Readmission Risk Prevention Plan  Transportation Screening Complete Complete Complete  PCP or Specialist Appt within 3-5 Days Complete  Complete  HRI or Home Care Consult Complete  Complete  Social Work Consult for Recovery Care Planning/Counseling Complete  Complete  Palliative Care Screening Not Applicable  Not Applicable  Medication Review Oceanographer)  Complete Complete  HRI or Home Care Consult  Complete   Palliative Care Screening  Not Applicable   Skilled Nursing Facility  Not Applicable

## 2023-11-17 DIAGNOSIS — N179 Acute kidney failure, unspecified: Secondary | ICD-10-CM | POA: Diagnosis not present

## 2023-11-17 DIAGNOSIS — N838 Other noninflammatory disorders of ovary, fallopian tube and broad ligament: Secondary | ICD-10-CM | POA: Diagnosis not present

## 2023-11-17 DIAGNOSIS — N762 Acute vulvitis: Secondary | ICD-10-CM | POA: Diagnosis not present

## 2023-11-17 DIAGNOSIS — A419 Sepsis, unspecified organism: Secondary | ICD-10-CM | POA: Diagnosis not present

## 2023-11-17 NOTE — TOC Progression Note (Addendum)
 Transition of Care [facility]) - Progression Note    Patient Details  Name: Christine Jacobson MRN: [id number] Date of Birth: Feb 28, 1955  Transition of Care [facility]) CM/SW Contact  Christine KANDICE Stain, RN Phone Number: 11/17/2023, 2:59 PM  Clinical Narrative:     Patient is agreeable to home health. This RNCM offered choice for Home Health, Christine Jacobson states she has no preference, RNCM made referral to [facility] with Hedda, He is able to take referral.  ICM (Inpatient Care Management) will continue to follow for needs.  Expected Discharge Plan: Home w Home Health Services Barriers to Discharge: Continued Medical Work up               Expected Discharge Plan and Services       Living arrangements for the past 2 months: Single Family Home Expected Discharge Date: 11/17/23                         HH Arranged: RN HH Agency: [facility] Date [facility] Agency Contacted: 11/17/23 Time HH Agency Contacted: [phone] Representative spoke with at [facility] Agency: Darleene   Social Drivers of Health (SDOH) Interventions SDOH Screenings   Food Insecurity: No Food Insecurity (11/14/2023)  Housing: Low Risk  (11/14/2023)  Transportation Needs: No Transportation Needs (11/14/2023)  Utilities: Not At Risk (11/14/2023)  Depression (PHQ2-9): Low Risk  (10/26/2021)  Financial Resource Strain: Low Risk  (12/03/2021)  Social Connections: Unknown (11/14/2023)  Recent Concern: Social Connections - Socially Isolated (11/14/2023)  Tobacco Use: Medium Risk (11/13/2023)    Readmission Risk Interventions    11/16/2023   10:02 AM 05/12/2023    4:15 PM 12/28/2022   11:28 AM  Readmission Risk Prevention Plan  Transportation Screening Complete Complete Complete  PCP or Specialist Appt within 3-5 Days Complete  Complete  HRI or Home Care Consult Complete  Complete  Social Work Consult for Recovery Care Planning/Counseling Complete  Complete  Palliative Care Screening Not Applicable  Not  Applicable  Medication Review Oceanographer)  Complete Complete  HRI or Home Care Consult  Complete   Palliative Care Screening  Not Applicable   Skilled Nursing Facility  Not Applicable

## 2023-11-17 NOTE — TOC Progression Note (Addendum)
 Transition of Care [facility]) - Progression Note    Patient Details  Name: Christine Jacobson MRN: [id number] Date of Birth: 17-Sep-1955  Transition of Care [facility]) CM/SW Contact  Roxie KANDICE Stain, RN Phone Number: 11/17/2023, 3:03 PM  Clinical Narrative:    11/17/2023 Case # [id number]  11/17/2023 1609 detailed notice of discharge and [id number] given.  Expected Discharge Plan: Home w Home Health Services Barriers to Discharge: Continued Medical Work up               Expected Discharge Plan and Services       Living arrangements for the past 2 months: Single Family Home Expected Discharge Date: 11/17/23                         HH Arranged: RN HH Agency: [facility] Date [facility] Agency Contacted: 11/17/23 Time HH Agency Contacted: [phone] Representative spoke with at [facility] Agency: Darleene   Social Drivers of Health (SDOH) Interventions SDOH Screenings   Food Insecurity: No Food Insecurity (11/14/2023)  Housing: Low Risk  (11/14/2023)  Transportation Needs: No Transportation Needs (11/14/2023)  Utilities: Not At Risk (11/14/2023)  Depression (PHQ2-9): Low Risk  (10/26/2021)  Financial Resource Strain: Low Risk  (12/03/2021)  Social Connections: Unknown (11/14/2023)  Recent Concern: Social Connections - Socially Isolated (11/14/2023)  Tobacco Use: Medium Risk (11/13/2023)    Readmission Risk Interventions    11/16/2023   10:02 AM 05/12/2023    4:15 PM 12/28/2022   11:28 AM  Readmission Risk Prevention Plan  Transportation Screening Complete Complete Complete  PCP or Specialist Appt within 3-5 Days Complete  Complete  HRI or Home Care Consult Complete  Complete  Social Work Consult for Recovery Care Planning/Counseling Complete  Complete  Palliative Care Screening Not Applicable  Not Applicable  Medication Review Oceanographer)  Complete Complete  HRI or Home Care Consult  Complete   Palliative Care Screening  Not Applicable   Skilled Nursing Facility   Not Applicable

## 2023-11-17 NOTE — TOC Progression Note (Signed)
 Transition of Care [facility]) - Progression Note    Patient Details  Name: Christine Jacobson MRN: [id number] Date of Birth: 31-Jul-1955  Transition [facility] Care [facility]) CM/SW Contact  Roxie KANDICE Stain, RN Phone Number: 11/17/2023, 10:17 AM  Clinical Narrative:    Spoke to patient  regarding transition needs. Patient lives alone and will need to be educated on dressing changes. Bedside RN is aware. At this time patient declines home health RN.  After teaching this NCM will readdress home health RN need with patient.    Expected Discharge Plan: Home w Home Health Services Barriers to Discharge: Continued Medical Work up               Expected Discharge Plan and Services       Living arrangements for the past 2 months: Single Family Home Expected Discharge Date: 11/16/23                                     Social Drivers of Health (SDOH) Interventions SDOH Screenings   Food Insecurity: No Food Insecurity (11/14/2023)  Housing: Low Risk  (11/14/2023)  Transportation Needs: No Transportation Needs (11/14/2023)  Utilities: Not At Risk (11/14/2023)  Depression (PHQ2-9): Low Risk  (10/26/2021)  Financial Resource Strain: Low Risk  (12/03/2021)  Social Connections: Unknown (11/14/2023)  Recent Concern: Social Connections - Socially Isolated (11/14/2023)  Tobacco Use: Medium Risk (11/13/2023)    Readmission Risk Interventions    11/16/2023   10:02 AM 05/12/2023    4:15 PM 12/28/2022   11:28 AM  Readmission Risk Prevention Plan  Transportation Screening Complete Complete Complete  PCP or Specialist Appt within 3-5 Days Complete  Complete  HRI or Home Care Consult Complete  Complete  Social Work Consult for Recovery Care Planning/Counseling Complete  Complete  Palliative Care Screening Not Applicable  Not Applicable  Medication Review Oceanographer)  Complete Complete  HRI or Home Care Consult  Complete   Palliative Care Screening  Not Applicable   Skilled  Nursing Facility  Not Applicable

## 2023-11-17 NOTE — Progress Notes (Signed)
 Demonstrated how to change leg wounds dressings to patient. When asked patient to do teach back, patient stating she already knows how to do it. Gave patient extra supplies to take home tomorrow.

## 2023-11-17 NOTE — Care Management Important Message (Signed)
 Important Message  Patient Details  Name: Christine Jacobson MRN: [id number] Date of Birth: 1955-06-23   Important Message Given:  Yes - Medicare IM     Claretta Deed 11/17/2023, 1:38 PM

## 2023-11-17 NOTE — TOC Progression Note (Signed)
 Transition of Care [facility]) - Progression Note    Patient Details  Name: Christine Jacobson MRN: [id number] Date of Birth: [birth year]  Transition of Care [facility]) CM/SW Contact  Claretta Deed Phone Number: 11/17/2023, 4:20 PM  Clinical Narrative:    [facility] Appeal) Detailed Notice of Discharge letter created and saved: Yes Sophronia Stain)   [facility] ROI Document Created: Yes Sophronia Stain) [facility] appeal documents uploaded to [facility]) site: Yes Cherilyn Deed)

## 2023-11-17 NOTE — Care Management Important Message (Signed)
 Important Message  Patient Details  Name: Christine Jacobson MRN: [id number] Date of Birth: Jul 20, 1955   Important Message Given:  Yes - Medicare IM     Claretta Deed 11/17/2023, 4:26 PM

## 2023-11-17 NOTE — Plan of Care (Signed)
  Problem: Activity: Goal: Risk for activity intolerance will decrease Outcome: Progressing   Problem: Elimination: Goal: Will not experience complications related to bowel motility Outcome: Progressing   

## 2023-11-17 NOTE — Discharge Summary (Signed)
 Physician Discharge Summary  Christine Jacobson FMW:[id number] DOB: 10/31/1955 DOA: 11/13/2023  PCP: [name], NP  Admit date: 11/13/2023 Discharge date: 11/17/23  Admitted From: Home. Disposition: Home. Recommendations for Outpatient Follow-up:  Outpatient follow-up with PCP in 1 to 2 weeks. Needs outpatient follow-up with gynecology for ovarian mass. Check CMP and CBC at follow-up. Please follow up on the following pending results: None  Home Health: No need identified. Equipment/Devices: No need identified.  Discharge Condition: Stable CODE STATUS: Full code Diet Orders (From admission, onward)     Start     Ordered   11/13/23 1603  Diet 2 gram sodium Room service appropriate? Yes; Fluid consistency: Thin  Diet effective now       Question Answer Comment  Room service appropriate? Yes   Fluid consistency: Thin      11/13/23 1602             Follow-up Information     [name], NP. Schedule an appointment as soon as possible for a visit in 1 week(s).   Specialty: Nurse Practitioner Why: Nov. 3, 2025 @1 :20 PM Pick up @12 :40 PM Contact information: [address] [place] [address] [phone]                 Hospital course 68 year old F with PMH of HOCM, HFpEF, HTN and HLD advised to go to ED by PCP after she reported severe vaginal yeast infection despite treatment with Diflucan, and admitted with sepsis due to vulvar cellulitis.   In ED, tachycardic and tachypneic.  WBC 15.  Lactic acid 3.8 and improved to 2.2.  COVID-19, influenza and RSV PCR nonreactive.  CT chest, abdomen and pelvis showed pulmonary edema and possible ovarian neoplasm.  GYN consulted.  Patient was started on amiodarone drip and broad-spectrum antibiotics and admitted.   Blood cultures NGTD.  MRSA PCR screen negative.  Evaluated by GYN.  Antibiotic de-escalated to Zyvox for 14 days total after consultation with ID.  Gynecology also recommended continuing  Diflucan for yeast infection.   On the day of discharge, patient's vulvar cellulitis improved.  She is discharged on Zyvox for 12 more days and p.o. Diflucan for 14 more days to complete treatment course.  Discontinued Jardiance  due to risk of recurrent infection in this area.  Patient was evaluated by PT/OT and no need identified.  Patient has been reminded to follow-up with gynecology for further evaluation on ovarian masses.  Patient was discharged on 11/16/2023 but contested the discharge and appealed to stay in the hospital.  She requests to stay one more night again today.  She reports improvement in her pain.  She is on oral medications for infection.  She is not willing to tell me the specific reason why she does not want to go home other than saying I just want to stay one more night.  Today is Friday.  I will go home tomorrow.   See individual problem list below for more.   Problems addressed during this hospitalization Severe sepsis due to vulvar cellulitis/yeast infection: Present on arrival.  Has tachycardia, tachypnea, leukocytosis and lactic acidosis.  She was placed on broad-spectrum antibiotics.  Patient was on Jardiance  which could increase her risk of infection. -Antibiotic escalated to Zyvox for a total of 2 weeks per discussion between ID and GYN. -P.o. Diflucan 100 mg daily per gynecology -Discontinue Jardiance  indefinitely due to risk of infection. -Outpatient follow-up with PCP and gynecology   Acute on chronic diastolic CHF, HOCM:  TTE on 9/25 with LVEF of > 75, HOCM, severe LAE.  BNP elevated to 900s.  Imaging showed pulmonary edema and cardiomegaly.  No cardiopulmonary symptoms but extremely fatigued.  Briefly diuresed with IV Lasix .  Transition to p.o.  Appears euvolemic on exam.  Mild AKI that is improving. -Discontinued Jardiance  due to vulvar cellulitis/infection -Recommend holding Lasix  for 1 week.   Paroxysmal with RVR-mild RVR to 120s on arrival.  Started on  amiodarone infusion.  Suspect her RVR was mainly driven by #1.  Initially started on amiodarone.  Transition to p.o. Toprol -XL. -Increased Toprol -XL to 50 mg daily -Continue Eliquis  for anticoagulation.   AKI: Baseline creatinine 1.0-1.1.  AKI improved. Recent Labs    05/13/23 0314 10/11/23 0935 10/12/23 0458 10/13/23 0431 10/14/23 0514 10/15/23 0504 11/13/23 0920 11/13/23 0921 11/15/23 0218 11/16/23 0309  BUN 22 24* 27* 33* 31* 34* 18 22 22 19   CREATININE 1.42* 1.01* 1.21* 1.23* 1.16* 1.27* 1.08* 1.10* 1.40* 1.31*  -Discontinued Jardiance  as above. -Patient to hold Lasix  for 1 week.   Physical deconditioning/fatigue - No need identified by PT/OT.   Mood disorder: Stable. -Continue home Lexapro . -Outpatient follow-up.   Ovarian mass c/f neoplasm - GYn recommended outpatient follow-up.   Chronic venous insufficiency stasis dermatitis - Continue leg evaluation and wound care.   Class I obesity Body mass index is 30.9 kg/m.           Consultations: Gynecology Infectious disease over the phone  Time spent 35  minutes  Vital signs Vitals:   11/16/23 1952 11/16/23 2338 11/17/23 0528 11/17/23 0842  BP: 126/86 128/86 (!) 152/88 (!) 142/87  Pulse: 75 72 72 78  Temp: 98.3 F (36.8 C) 98 F (36.7 C) 97.7 F (36.5 C) 97.9 F (36.6 C)  Resp: 16 19 14 18   Height:      Weight:      SpO2: 98% 95% 91% 95%  TempSrc: Oral Oral Oral Oral  BMI (Calculated):         Discharge exam  GENERAL: No apparent distress.  Nontoxic. HEENT: MMM.  Vision and hearing grossly intact.  NECK: Supple.  No apparent JVD.  RESP:  No IWOB.  Fair aeration bilaterally. CVS:  RRR. Heart sounds normal.  ABD/GI/GU: BS+. Abd soft, NTND.  GU exam deferred. MSK/EXT:  Moves extremities.  Stasis dermatitis/chronic venous insufficiency. SKIN: As above. NEURO: Awake and alert. Oriented appropriately.  No apparent focal neuro deficit. PSYCH: Calm.  No distress or agitation.  Discharge  Instructions Discharge Instructions     Discharge instructions   Complete by: As directed    It has been a pleasure taking care of you!  You were hospitalized due to vulvar infection for which you have been started on antibiotics.  Your symptoms improved.  We are discharging you on antibiotics to complete treatment course.  Note that we have stopped Jardiance  since it could increase your risk of vulvar infection.  Review your new medication list and the directions on your medications before you take them.  Follow-up with your primary care doctor in 1 to 2 weeks or sooner if needed.  We also recommend follow-up with gynecology for the ovarian mass.   Take care,   Discharge wound care:   Complete by: As directed    Wound care  Daily      Comments: Cleanse B lower leg wounds with NS, apply Xeroform gauze Soila [phone]) to wound beds daily, cover with ABD pads and secure with Kerlix roll gauze preferably wrapped  from right above toes to right below knees.  Apply Ace bandage wrapped in same fashion for light compression as patient will allow   Increase activity slowly   Complete by: As directed       Allergies as of 11/17/2023       Reactions   Porcine (pork) Protein-containing Drug Products Other (See Comments)   Religious preference        Medication List     PAUSE taking these medications    furosemide  40 MG tablet Wait to take this until: November 23, 2023 Commonly known as: Lasix  Take 1 tablet (40 mg total) by mouth in the morning.   Potassium Chloride  ER 20 MEQ Tbcr Wait to take this until: November 23, 2023 Take 1 tablet (20 mEq total) by mouth every other day.       STOP taking these medications    Jardiance  10 MG Tabs tablet Generic drug: empagliflozin        TAKE these medications    acetaminophen  325 MG tablet Commonly known as: TYLENOL  Take 2 tablets (650 mg total) by mouth every 6 (six) hours as needed for mild pain (pain score 1-3) or fever (or Fever >/=  101).   allopurinol  100 MG tablet Commonly known as: ZYLOPRIM  Take 1 tablet (100 mg total) by mouth daily.   apixaban  5 MG Tabs tablet Commonly known as: Eliquis  Take 1 tablet (5 mg total) by mouth 2 (two) times daily.   ascorbic acid  500 MG tablet Commonly known as: VITAMIN C  Take 1 tablet (500 mg total) by mouth daily for 30 days   atorvastatin  80 MG tablet Commonly known as: LIPITOR Take 1 tablet (80 mg total) by mouth daily.   escitalopram  5 MG tablet Commonly known as: LEXAPRO  Take 1 tablet (5 mg total) by mouth in the morning for moods.   ezetimibe  10 MG tablet Commonly known as: Zetia  Take 1 tablet (10 mg total) by mouth daily for cholesterol.   fluconazole 100 MG tablet Commonly known as: DIFLUCAN Take 1 tablet (100 mg total) by mouth daily for 14 days. What changed:  medication strength how much to take when to take this   linezolid 600 MG tablet Commonly known as: ZYVOX Take 1 tablet (600 mg total) by mouth every 12 (twelve) hours for 12 days.   metoprolol  succinate 50 MG 24 hr tablet Commonly known as: TOPROL -XL Take 1 tablet (50 mg total) by mouth daily. Take with or immediately following a meal. What changed:  medication strength how much to take additional instructions   pantoprazole  40 MG tablet Commonly known as: PROTONIX  Take 1 tablet (40 mg total) by mouth daily.   Zinc  Sulfate 220 (50 Zn) MG Tabs Take 1 tablet (220 mg total) by mouth daily for 30 days               Discharge Care Instructions  (From admission, onward)           Start     Ordered   11/16/23 0000  Discharge wound care:       Comments: Wound care  Daily      Comments: Cleanse B lower leg wounds with NS, apply Xeroform gauze Soila [phone]) to wound beds daily, cover with ABD pads and secure with Kerlix roll gauze preferably wrapped from right above toes to right below knees.  Apply Ace bandage wrapped in same fashion for light compression as patient will allow    11/16/23 0903  Procedures/Studies: None   DG Tibia/Fibula Right Result Date: 11/13/2023 EXAM: _VIEWS_ VIEW(S) XRAY OF THE _LATERALITY_ TIBIA AND FIBULA 11/13/2023 02:35:48 PM COMPARISON: None available. CLINICAL HISTORY: wound FINDINGS: BONES AND JOINTS: No acute fracture. No focal osseous lesion. No joint dislocation. SOFT TISSUES: Diffuse subcutaneous soft tissue edema. IMPRESSION: 1. Diffuse subcutaneous soft tissue edema. 2. No acute osseous findings. Electronically signed by: Waddell Calk MD 11/13/2023 03:37 PM EDT RP Workstation: [id number]   CT CHEST ABDOMEN PELVIS W CONTRAST Result Date: 11/13/2023 CLINICAL DATA:  Sepsis * Tracking Code: BO * EXAM: CT CHEST, ABDOMEN, AND PELVIS WITH CONTRAST TECHNIQUE: Multidetector CT imaging of the chest, abdomen and pelvis was performed following the standard protocol during bolus administration of intravenous contrast. RADIATION DOSE REDUCTION: This exam was performed according to the departmental dose-optimization program which includes automated exposure control, adjustment of the mA and/or kV according to patient size and/or use of iterative reconstruction technique. CONTRAST:  75mL OMNIPAQUE  IOHEXOL  350 MG/ML SOLN COMPARISON:  CT chest angiogram, 05/10/2023 FINDINGS: CT CHEST FINDINGS Cardiovascular: Scattered aortic atherosclerosis. Cardiomegaly. Three-vessel coronary artery calcifications. No pericardial effusion. Mediastinum/Nodes: Unchanged prominent mediastinal lymph nodes, high right paratracheal node measuring 1.6 x 0.8 cm (series 3, image 13). Heterogeneous nodular thyroid and isthmus (series 3, image 12). Trachea, and esophagus demonstrate no significant findings. Lungs/Pleura: Diffuse interlobular septal thickening, mosaic attenuation of the airspaces, and diffuse bilateral bronchial wall thickening. Small benign calcified nodule of the medial right lung base requiring no further follow-up or characterization (series 5, image  110). No pleural effusion or pneumothorax. Musculoskeletal: No chest wall abnormality. No acute osseous findings. CT ABDOMEN PELVIS FINDINGS Hepatobiliary: No solid liver abnormality is seen. No gallstones, gallbladder wall thickening, or biliary dilatation. Pancreas: Unremarkable. No pancreatic ductal dilatation or surrounding inflammatory changes. Spleen: Normal in size without significant abnormality. Adrenals/Urinary Tract: Adrenal glands are unremarkable. Kidneys are normal, without renal calculi, solid lesion, or hydronephrosis. Bladder is unremarkable. Stomach/Bowel: Stomach is within normal limits. Appendix appears normal. No evidence of bowel wall thickening, distention, or inflammatory changes. Vascular/Lymphatic: Aortic atherosclerosis. No enlarged abdominal or pelvic lymph nodes. Reproductive: Large cystic lesions of the ovaries, on the right, situated in the right lower quadrant measuring 12.2 x 8.1 x 8.4 cm (series 3, image 93) and arising from the left ovary situated in the posterior pelvis with a visible internal septation measuring 9.0 x 7.9 x 6.6 cm (series 3, image 100). Other: No abdominal wall hernia or abnormality. No ascites. Musculoskeletal: No acute osseous findings. IMPRESSION: 1. Diffuse interlobular septal thickening, mosaic attenuation of the airspaces, and diffuse bilateral bronchial wall thickening, consistent with pulmonary edema. 2. Cardiomegaly and coronary artery disease. 3. Large cystic lesions of the ovaries, on the right, situated in the right lower quadrant measuring 12.2 x 8.1 x 8.4 cm and arising from the left ovary situated in the posterior pelvis with a visible internal septation measuring 9.0 x 7.9 x 6.6 cm. 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 Recommend gynecologic referral and consideration of further evaluation with pelvic ultrasound and/or contrast enhanced pelvic MRI. 4. Unchanged prominent mediastinal lymph nodes, nonspecific and likely reactive. 5.  Heterogeneous nodular thyroid and isthmus. Recommend dedicated thyroid ultrasound on a nonemergent, outpatient basis if and when clinically appropriate. Aortic Atherosclerosis (ICD10-I70.0). Electronically Signed   By: Marolyn JONETTA Jaksch M.D.   On: 11/13/2023 13:41   DG Chest Port 1 View Result Date: 11/13/2023 EXAM: 1 VIEW(S) XRAY OF THE CHEST 11/13/2023 08:47:00 AM COMPARISON: 10/11/2023 CLINICAL HISTORY: Reason for exam: questionable sepsis;  SHOB/vaginal bleeding. Upon ems arrival pt tachypnea 22, Afib rvr 120-160. EMS reports edema throughout , weeping edema r leg. FINDINGS: LUNGS AND PLEURA: No focal pulmonary opacity. No pulmonary edema. No pleural effusion. No pneumothorax. HEART AND MEDIASTINUM: Stable cardiomegaly. BONES AND SOFT TISSUES: No acute osseous abnormality. IMPRESSION: 1. Stable cardiomegaly. 2. No acute lung disease. Electronically signed by: Norleen Kil MD 11/13/2023 09:18 AM EDT RP Workstation: [id number]       The results of significant diagnostics from this hospitalization (including imaging, microbiology, ancillary and laboratory) are listed below for reference.     Microbiology: Recent Results (from the past 240 hours)  Blood Culture (routine x 2)     Status: None (Preliminary result)   Collection Time: 11/13/23  8:38 AM   Specimen: BLOOD LEFT ARM  Result Value Ref Range Status   Specimen Description BLOOD LEFT ARM  Final   Special Requests   Final    BOTTLES DRAWN AEROBIC AND ANAEROBIC Blood Culture results may not be optimal due to an inadequate volume of blood received in culture bottles   Culture   Final    NO GROWTH 4 DAYS Performed at [facility], [address] [place], [place] [address]    Report Status PENDING  Incomplete  Blood Culture (routine x 2)     Status: None (Preliminary result)   Collection Time: 11/13/23  9:14 AM   Specimen: BLOOD RIGHT HAND  Result Value Ref Range Status   Specimen Description BLOOD RIGHT HAND  Final   Special Requests    Final    BOTTLES DRAWN AEROBIC AND ANAEROBIC Blood Culture results may not be optimal due to an inadequate volume of blood received in culture bottles   Culture   Final    NO GROWTH 4 DAYS Performed at [facility], [address], [place], [place] [address]    Report Status PENDING  Incomplete  Resp panel by RT-PCR (RSV, Flu A&B, Covid) Anterior Nasal Swab     Status: None   Collection Time: 11/13/23  9:18 AM   Specimen: Anterior Nasal Swab  Result Value Ref Range Status   SARS Coronavirus 2 by RT PCR NEGATIVE NEGATIVE Final   Influenza A by PCR NEGATIVE NEGATIVE Final   Influenza B by PCR NEGATIVE NEGATIVE Final    Comment: (NOTE) The Xpert Xpress SARS-CoV-2/FLU/RSV plus assay is intended as an aid in the diagnosis of influenza from Nasopharyngeal swab specimens and should not be used as a sole basis for treatment. Nasal washings and aspirates are unacceptable for Xpert Xpress SARS-CoV-2/FLU/RSV testing.  Fact Sheet for Patients: bloggercourse.com  Fact Sheet for Healthcare Providers: seriousbroker.it  This test is not yet approved or cleared by the [place]  FDA and has been authorized for detection and/or diagnosis of SARS-CoV-2 by FDA under an Emergency Use Authorization (EUA). This EUA will remain in effect (meaning this test can be used) for the duration of the COVID-19 declaration under Section 564(b)(1) of the Act, 21 U.S.C. section 360bbb-3(b)(1), unless the authorization is terminated or revoked.     Resp Syncytial Virus by PCR NEGATIVE NEGATIVE Final    Comment: (NOTE) Fact Sheet for Patients: bloggercourse.com  Fact Sheet for Healthcare Providers: seriousbroker.it  This test is not yet approved or cleared by the [place]  FDA and has been authorized for detection and/or diagnosis of SARS-CoV-2 by FDA under an Emergency Use Authorization (EUA).  This EUA will remain in effect (meaning this test can be used) for the  duration of the COVID-19 declaration under Section 564(b)(1) of the Act, 21 U.S.C. section 360bbb-3(b)(1), unless the authorization is terminated or revoked.  Performed at [facility], [address] [place], [place] [address]   MRSA Next Gen by PCR, Nasal     Status: None   Collection Time: 11/14/23  3:42 PM   Specimen: Nasal Mucosa; Nasal Swab  Result Value Ref Range Status   MRSA by PCR Next Gen NOT DETECTED NOT DETECTED Final    Comment: (NOTE) The GeneXpert MRSA Assay (FDA approved for NASAL specimens only), is one component of a comprehensive MRSA colonization surveillance program. It is not intended to diagnose MRSA infection nor to guide or monitor treatment for MRSA infections. Test performance is not FDA approved in patients less than 90 years old. Performed at [facility], [address], [place], [place] [address]      Labs:  CBC: Recent Labs  Lab 11/13/23 0920 11/13/23 0921 11/15/23 0218 11/16/23 0309  WBC 15.2*  --  10.1 6.7  NEUTROABS 13.1*  --   --   --   HGB 12.4 12.6  12.9 10.9* 10.5*  HCT 39.3 37.0  38.0 34.0* 34.3*  MCV 93.6  --  92.1 94.8  PLT 214  --  223 214   BMP &GFR Recent Labs  Lab 11/13/23 0920 11/13/23 0921 11/15/23 0218 11/16/23 0309  NA 139 137  138 136 138  K 4.8 4.4  4.5 3.9 4.1  CL 110 113* 109 108  CO2 16*  --  18* 20*  GLUCOSE 97 97 126* 106*  BUN 18 22 22 19   CREATININE 1.08* 1.10* 1.40* 1.31*  CALCIUM  8.9  --  8.7* 8.7*  MG 1.8  --  1.8 1.9  PHOS  --   --  2.3* 3.1   Estimated Creatinine Clearance: 41.3 mL/min (A) (by C-G formula based on SCr of 1.31 mg/dL (H)). Liver & Pancreas: Recent Labs  Lab 11/13/23 0920 11/15/23 0218 11/16/23 0309  AST 46* 44*  --   ALT 51* 44  --   ALKPHOS 216* 175*  --   BILITOT 1.0 0.9  --   PROT 6.2* 5.7*  --   ALBUMIN  3.0* 2.6* 2.4*   No results for input(s): LIPASE, AMYLASE in the last  168 hours. No results for input(s): AMMONIA in the last 168 hours. Diabetic: No results for input(s): HGBA1C in the last 72 hours. No results for input(s): GLUCAP in the last 168 hours. Cardiac Enzymes: No results for input(s): CKTOTAL, CKMB, CKMBINDEX, TROPONINI in the last 168 hours. Recent Labs    10/11/23 0935 10/12/23 0458  PROBNP 4,647.0* 4,823.0*   Coagulation Profile: Recent Labs  Lab 11/13/23 0920  INR 1.0   Thyroid Function Tests: No results for input(s): TSH, T4TOTAL, FREET4, T3FREE, THYROIDAB in the last 72 hours. Lipid Profile: No results for input(s): CHOL, HDL, LDLCALC, TRIG, CHOLHDL, LDLDIRECT in the last 72 hours. Anemia Panel: No results for input(s): VITAMINB12, FOLATE, FERRITIN, TIBC, IRON , RETICCTPCT in the last 72 hours. Urine analysis:    Component Value Date/Time   COLORURINE YELLOW 11/13/2023 0833   APPEARANCEUR CLEAR 11/13/2023 0833   LABSPEC >1.046 (H) 11/13/2023 0833   PHURINE 5.0 11/13/2023 0833   GLUCOSEU 50 (A) 11/13/2023 0833   HGBUR NEGATIVE 11/13/2023 0833   BILIRUBINUR NEGATIVE 11/13/2023 0833   KETONESUR 5 (A) 11/13/2023 0833   PROTEINUR 30 (A) 11/13/2023 0833   NITRITE NEGATIVE 11/13/2023 0833   LEUKOCYTESUR NEGATIVE 11/13/2023  9166   Sepsis Labs: Invalid input(s): PROCALCITONIN, LACTICIDVEN   SIGNED:  Antasia Haider T Valleri Hendricksen, MD  [facility] 11/17/2023, 12:04 PM

## 2023-11-17 NOTE — Progress Notes (Signed)
 Gynecology Consult Progress Note  Admission Date: 11/13/2023 Current Date: 11/17/2023 4:42 PM  Christine Jacobson is a 68 y.o. G0 HD#5 admitted for +SIRS criteria with vulvar cellulitis noted along with large b/l adnexal masses   History complicated by: Patient Active Problem List   Diagnosis Date Noted   Vulvar cellulitis 11/13/2023   Ovarian mass, right 11/13/2023   Ovarian mass, left 11/13/2023   Sepsis (HCC) 11/13/2023   Coronary artery calcification seen on CAT scan 10/13/2023   Elevated troponin 10/12/2023   Abnormal stress test 10/12/2023   Acute diastolic (congestive) heart failure (HCC) 10/11/2023   Persistent atrial fibrillation (HCC) 12/28/2022   Acute on chronic diastolic CHF (congestive heart failure) (HCC) 12/27/2022   Major depressive disorder, 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   MDD (major depressive disorder) 09/29/2022   Anxiety 09/16/2022   Acute exacerbation of CHF (congestive heart failure) (HCC) 09/15/2022   Atrial fibrillation by electrocardiogram (HCC) 09/15/2022   Leg swelling 09/15/2022   GIB (gastrointestinal bleeding) 08/05/2022   Acquired thrombophilia 08/05/2022   Chronic anticoagulation 08/05/2022   Acute blood loss anemia 08/04/2022   Cellulitis of both lower extremities 07/13/2022   Hypocarbia 07/13/2022   Normocytic anemia 07/13/2022   Cellulitis 06/02/2022   Sepsis due to cellulitis (HCC) 06/02/2022   AKI (acute kidney injury) 06/02/2022   Hyponatremia 06/02/2022   Dehydration 06/02/2022   Metabolic acidosis 06/02/2022   Pulmonary HTN (HCC)    Cellulitis and abscess of left lower extremity 10/13/2021   PAF (paroxysmal atrial fibrillation) (HCC) 07/28/2021   Obesity (BMI 30-39.9)    Liver function test abnormality    Primary hypertension    Chronic diastolic heart failure (HCC) 12/15/2016   HOCM (hypertrophic obstructive cardiomyopathy) (HCC) 01/22/2016   Lower extremity edema 09/17/2014   History of syncope  12/05/2013   Syncope 12/05/2013   Pneumonia 11/23/2012   Shortness of breath 07/11/2011   Hypertension 11/23/2010   Chest pain of uncertain etiology 10/18/2010   Murmur 10/18/2010   Palpitations 09/23/2010   Mild hyperlipidemia    Elevated BP    Hyperprolactinemia (HCC)    Pyoderma gangrenosa (HCC)    ROS and patient/family/surgical history, located on admission H&P note dated 11/13/2023, have been reviewed, and there are no changes except as noted below  Yesterday/Overnight Events:  none  Subjective:  No new concerns or complaints today. Wondering why she got an infection of her vulva/skin. Reports she is being discharged home tomorrow.   Objective:     Current Vital Signs 24h Vital Sign Ranges  T 98 F (36.7 C) Temp  Avg: 98 F (36.7 C)  Min: 97.7 F (36.5 C)  Max: 98.3 F (36.8 C)  BP 126/82 BP  Min: 126/82  Max: 152/88  HR 66 Pulse  Avg: 73  Min: 66  Max: 78  RR 17 Resp  Avg: 16  Min: 12  Max: 19  SaO2 94 % Room Air SpO2  Avg: 94.8 %  Min: 91 %  Max: 98 %       24 Hour I/O Current Shift I/O  Time Ins Outs 10/30 0701 - 10/31 0700 In: 240 [P.O.:240] Out: 550 [Urine:550] No intake/output data recorded.   Patient Vitals for the past 12 hrs:  BP Temp Temp src Pulse Resp SpO2  11/17/23 1630 126/82 98 F (36.7 C) Oral 66 17 94 %  11/17/23 1211 133/71 98 F (36.7 C) Oral 75 12 96 %  11/17/23 0842 (!) 142/87 97.9 F (36.6 C) Oral  78 18 95 %  11/17/23 0528 (!) 152/88 97.7 F (36.5 C) Oral 72 14 91 %   Physical exam: General appearance: alert, cooperative, and appears stated age GU: Improving erythema of the inferior vulva/perineum. Stable erythema on bilateral inner thighs Lungs: no respiratory distress  Medications Current Facility-Administered Medications  Medication Dose Route Frequency Provider Last Rate Last Admin   acetaminophen  (TYLENOL ) tablet 650 mg  650 mg Oral Q6H PRN Gonfa, Taye T, MD   650 mg at 11/16/23 9191   apixaban  (ELIQUIS ) tablet 5 mg  5 mg  Oral BID Georgina Basket, MD   5 mg at 11/17/23 9051   atorvastatin  (LIPITOR) tablet 80 mg  80 mg Oral Daily Georgina Basket, MD   80 mg at 11/17/23 9051   Chlorhexidine  Gluconate Cloth 2 % PADS 6 each  6 each Topical Daily Gherghe, Costin M, MD   6 each at 11/17/23 9050   escitalopram  (LEXAPRO ) tablet 5 mg  5 mg Oral Daily Georgina Basket, MD   5 mg at 11/17/23 0948   fluconazole (DIFLUCAN) tablet 100 mg  100 mg Oral Daily Anyanwu, Ugonna A, MD   100 mg at 11/17/23 0949   linezolid (ZYVOX) tablet 600 mg  600 mg Oral Q12H Gonfa, Taye T, MD   600 mg at 11/17/23 9051   metoprolol  succinate (TOPROL -XL) 24 hr tablet 50 mg  50 mg Oral Daily Gonfa, Taye T, MD   50 mg at 11/17/23 0948   nystatin ointment (MYCOSTATIN)   Topical BID Izell Harari, MD   Given at 11/17/23 0950   ondansetron  (ZOFRAN ) injection 4 mg  4 mg Intravenous Q6H PRN Gonfa, Taye T, MD   4 mg at 11/15/23 1425   pantoprazole  (PROTONIX ) EC tablet 40 mg  40 mg Oral Daily Georgina Basket, MD   40 mg at 11/17/23 0948    Labs  BCx: NGTD Negative: hsv 1 and 2 IgG    Latest Ref Rng & Units 11/16/2023    3:09 AM 11/15/2023    2:18 AM 11/13/2023    9:21 AM  CBC  WBC 4.0 - 10.5 K/uL 6.7  10.1    Hemoglobin 12.0 - 15.0 g/dL 89.4  89.0  87.3    87.0   Hematocrit 36.0 - 46.0 % 34.3  34.0  37.0    38.0   Platelets 150 - 400 K/uL 214  223     Radiology No new imaging  Assessment & Plan:  Patient improving *Vulvar cellulitis:Stable to improved exam today based on prior documentation. Continue Nystatin and Diflucan 100 mg daily for at least 14 days. Continue Zyvox for a 14d total course as per ID recommendations. *Adnexal masses: outpatient gyn onc referral order placed for late November.  Code Status: Full Code  Kieth JAYSON Carolin, MD Attending [facility]) GYN Consult Phone: [phone] (M-F, 0800-1700) & [phone] (Off hours, weekends, holidays)

## 2023-11-18 LAB — CULTURE, BLOOD (ROUTINE X 2)
Culture: NO GROWTH
Culture: NO GROWTH

## 2023-11-18 NOTE — Evaluation (Signed)
 Occupational Therapy Evaluation Patient Details Name: Christine Jacobson MRN: [id number] DOB: Aug 13, 1955 Today's Date: 11/18/2023   History of Present Illness   Pt is 68 yo presenting to [facility] [place] on 10/27 due to presumed sepsis. Pt mainly came to hospital for vaginal pain and irritation. PMHx:  bil LE edema, HOCM, HTN, diastolic CHF, HLD, cardioversion     Clinical Impressions  Limited eval today. Per RN, pt preparing for d/c, OT introduced self and role to pt, offered to assist with dressing in preparation for d/c. Pt initially agreeable and performed supine to sit at mod I. Pt became anxious and overwhelemed with d/c process and requested to be left alone. With encouragement, pt agreeable to don briefs with OT before ending evaluation. Pt donned briefs and completed STS at supervision level. OT will continue to follow acutely to maximize functional independence.     If plan is discharge home, recommend the following:   A little help with walking and/or transfers;A little help with bathing/dressing/bathroom;Assistance with cooking/housework     Functional Status Assessment   Patient has had a recent decline in their functional status and demonstrates the ability to make significant improvements in function in a reasonable and predictable amount of time.     Equipment Recommendations   None recommended by OT     Recommendations for Other Services         Precautions/Restrictions   Precautions Precautions: Fall Recall of Precautions/Restrictions: Intact Restrictions Weight Bearing Restrictions Per Provider Order: No     Mobility Bed Mobility Overal bed mobility: Modified Independent     General bed mobility comments: Use of rails, HOB elevated    Transfers Overall transfer level: Needs assistance Equipment used: None Transfers: Sit to/from Stand Sit to Stand: Supervision           General transfer comment: pt completed STS to don brief, overall WFL       Balance Overall balance assessment: Mild deficits observed, not formally tested         ADL either performed or assessed with clinical judgement   ADL Overall ADL's : Needs assistance/impaired Eating/Feeding: Set up;Sitting   Grooming: Set up;Sitting           Upper Body Dressing : Set up;Sitting   Lower Body Dressing: Supervision/safety;Sit to/from stand       Toileting- Architect and Hygiene: Supervision/safety;Sitting/lateral lean;Sit to/from stand       Functional mobility during ADLs: Supervision/safety General ADL Comments: Limited d/t pt anxious and agitated     Vision Baseline Vision/History: 1 Wears glasses Patient Visual Report: No change from baseline Vision Assessment?: No apparent visual deficits            Pertinent Vitals/Pain Pain Assessment Pain Assessment: No/denies pain     Extremity/Trunk Assessment Upper Extremity Assessment Upper Extremity Assessment: Overall WFL for tasks assessed   Lower Extremity Assessment Lower Extremity Assessment: Defer to PT evaluation   Cervical / Trunk Assessment Cervical / Trunk Assessment: Normal   Communication Communication Communication: Impaired Factors Affecting Communication: Difficulty expressing self   Cognition Arousal: Alert Behavior During Therapy: Anxious, Agitated Cognition: No apparent impairments                               Following commands: Impaired Following commands impaired: Follows one step commands inconsistently     Cueing  General Comments   Cueing Techniques: Verbal cues  Wound bandages intact on bil LEs  Home Living Family/patient expects to be discharged to:: Private residence Living Arrangements: Alone Available Help at Discharge: Friend(s) Type of Home: Apartment Home Access: Stairs to enter [employer] of Steps: 6 Entrance Stairs-Rails: Left Home Layout: One level     Bathroom Shower/Tub: Sponge bathes  at baseline   Bathroom Toilet: Standard Bathroom Accessibility: Yes How Accessible: Accessible via walker Home Equipment: Rolling Walker (2 wheels)   Additional Comments: Home set up achieved from previous admission      Prior Functioning/Environment Prior Level of Function : Independent/Modified Independent             Mobility Comments: Use of RW ADLs Comments: bathes at sink, uses delievery services for groceries    OT Problem List: Decreased strength;Decreased range of motion;Decreased activity tolerance;Impaired balance (sitting and/or standing);Decreased safety awareness;Decreased knowledge of use of DME or AE;Decreased knowledge of precautions;Cardiopulmonary status limiting activity   OT Treatment/Interventions: Self-care/ADL training;Therapeutic exercise;Energy conservation;DME and/or AE instruction;Therapeutic activities;Patient/family education;Balance training      OT Goals(Current goals can be found in the care plan section)   Acute Rehab OT Goals Patient Stated Goal: To be left alone OT Goal Formulation: With patient Time For Goal Achievement: 12/02/23 Potential to Achieve Goals: Fair   OT Frequency:  Min 2X/week       AM-PAC OT 6 Clicks Daily Activity     Outcome Measure Help from another person eating meals?: None Help from another person taking care of personal grooming?: A Little Help from another person toileting, which includes using toliet, bedpan, or urinal?: A Little Help from another person bathing (including washing, rinsing, drying)?: A Little Help from another person to put on and taking off regular upper body clothing?: A Little Help from another person to put on and taking off regular lower body clothing?: A Little 6 Click Score: 19   End of Session Nurse Communication: Mobility status  Activity Tolerance: Other (comment) (pt feeling overwhelemed) Patient left: in bed;with call bell/phone within reach  OT Visit Diagnosis:  Unsteadiness on feet (R26.81);Other abnormalities of gait and mobility (R26.89);Muscle weakness (generalized) (M62.81)                Time: 9060-9043 OT Time Calculation (min): 17 min Charges:  OT General Charges $OT Visit: 1 Visit OT Evaluation $OT Eval Moderate Complexity: 1 Mod  [name] C, OT  Acute Rehabilitation Services Office [phone] Secure chat preferred   Christine Jacobson Savers 11/18/2023, 10:12 AM

## 2023-11-18 NOTE — Progress Notes (Signed)
 Patient discharged home on 11/16/2023 but contested discharge and appealed excessively.  Remained medically stable and discharged again on 11/17/2023 and remained in hospital again.  No major events overnight of this morning.  Remains medically stable for discharge.  Discharge summary and order from 11/17/2023 remains in effect

## 2023-11-21 ENCOUNTER — Emergency Department (HOSPITAL_COMMUNITY)

## 2023-11-21 ENCOUNTER — Inpatient Hospital Stay (HOSPITAL_COMMUNITY)
Admission: EM | Admit: 2023-11-21 | Discharge: 2023-11-27 | DRG: 871 | Disposition: A | Discharge status: Skilled Nursing Facility | Attending: Internal Medicine | Admitting: Internal Medicine

## 2023-11-21 ENCOUNTER — Other Ambulatory Visit: Payer: Self-pay

## 2023-11-21 ENCOUNTER — Encounter (HOSPITAL_COMMUNITY): Payer: Self-pay | Admitting: Pharmacy Technician

## 2023-11-21 DIAGNOSIS — R55 Syncope and collapse: Secondary | ICD-10-CM | POA: Diagnosis present

## 2023-11-21 DIAGNOSIS — L03317 Cellulitis of buttock: Secondary | ICD-10-CM | POA: Diagnosis not present

## 2023-11-21 DIAGNOSIS — Z833 Family history of diabetes mellitus: Secondary | ICD-10-CM

## 2023-11-21 DIAGNOSIS — I5032 Chronic diastolic (congestive) heart failure: Secondary | ICD-10-CM | POA: Diagnosis present

## 2023-11-21 DIAGNOSIS — Z881 Allergy status to other antibiotic agents status: Secondary | ICD-10-CM

## 2023-11-21 DIAGNOSIS — I110 Hypertensive heart disease with heart failure: Secondary | ICD-10-CM | POA: Diagnosis present

## 2023-11-21 DIAGNOSIS — Z7901 Long term (current) use of anticoagulants: Secondary | ICD-10-CM | POA: Diagnosis not present

## 2023-11-21 DIAGNOSIS — I421 Obstructive hypertrophic cardiomyopathy: Secondary | ICD-10-CM | POA: Diagnosis present

## 2023-11-21 DIAGNOSIS — F17210 Nicotine dependence, cigarettes, uncomplicated: Secondary | ICD-10-CM | POA: Diagnosis present

## 2023-11-21 DIAGNOSIS — A419 Sepsis, unspecified organism: Secondary | ICD-10-CM | POA: Diagnosis present

## 2023-11-21 DIAGNOSIS — L89302 Pressure ulcer of unspecified buttock, stage 2: Secondary | ICD-10-CM | POA: Diagnosis present

## 2023-11-21 DIAGNOSIS — Z825 Family history of asthma and other chronic lower respiratory diseases: Secondary | ICD-10-CM | POA: Diagnosis not present

## 2023-11-21 DIAGNOSIS — I480 Paroxysmal atrial fibrillation: Secondary | ICD-10-CM | POA: Diagnosis present

## 2023-11-21 DIAGNOSIS — R6521 Severe sepsis with septic shock: Secondary | ICD-10-CM | POA: Diagnosis present

## 2023-11-21 DIAGNOSIS — N179 Acute kidney failure, unspecified: Secondary | ICD-10-CM | POA: Diagnosis present

## 2023-11-21 DIAGNOSIS — E860 Dehydration: Secondary | ICD-10-CM | POA: Diagnosis present

## 2023-11-21 DIAGNOSIS — B372 Candidiasis of skin and nail: Secondary | ICD-10-CM | POA: Diagnosis not present

## 2023-11-21 DIAGNOSIS — Z8249 Family history of ischemic heart disease and other diseases of the circulatory system: Secondary | ICD-10-CM | POA: Diagnosis not present

## 2023-11-21 DIAGNOSIS — Z8041 Family history of malignant neoplasm of ovary: Secondary | ICD-10-CM | POA: Diagnosis not present

## 2023-11-21 DIAGNOSIS — L03116 Cellulitis of left lower limb: Secondary | ICD-10-CM | POA: Diagnosis present

## 2023-11-21 DIAGNOSIS — Y92239 Unspecified place in hospital as the place of occurrence of the external cause: Secondary | ICD-10-CM | POA: Diagnosis not present

## 2023-11-21 DIAGNOSIS — N83202 Unspecified ovarian cyst, left side: Secondary | ICD-10-CM | POA: Diagnosis present

## 2023-11-21 DIAGNOSIS — I4891 Unspecified atrial fibrillation: Secondary | ICD-10-CM | POA: Diagnosis not present

## 2023-11-21 DIAGNOSIS — I5030 Unspecified diastolic (congestive) heart failure: Secondary | ICD-10-CM

## 2023-11-21 DIAGNOSIS — I878 Other specified disorders of veins: Secondary | ICD-10-CM | POA: Diagnosis present

## 2023-11-21 DIAGNOSIS — Y92009 Unspecified place in unspecified non-institutional (private) residence as the place of occurrence of the external cause: Secondary | ICD-10-CM

## 2023-11-21 DIAGNOSIS — T361X5A Adverse effect of cephalosporins and other beta-lactam antibiotics, initial encounter: Secondary | ICD-10-CM | POA: Diagnosis not present

## 2023-11-21 DIAGNOSIS — Z79899 Other long term (current) drug therapy: Secondary | ICD-10-CM

## 2023-11-21 DIAGNOSIS — W19XXXA Unspecified fall, initial encounter: Secondary | ICD-10-CM | POA: Diagnosis present

## 2023-11-21 DIAGNOSIS — E785 Hyperlipidemia, unspecified: Secondary | ICD-10-CM | POA: Diagnosis present

## 2023-11-21 DIAGNOSIS — N83201 Unspecified ovarian cyst, right side: Secondary | ICD-10-CM | POA: Diagnosis present

## 2023-11-21 DIAGNOSIS — R531 Weakness: Secondary | ICD-10-CM

## 2023-11-21 DIAGNOSIS — N838 Other noninflammatory disorders of ovary, fallopian tube and broad ligament: Secondary | ICD-10-CM | POA: Diagnosis present

## 2023-11-21 LAB — URINALYSIS, W/ REFLEX TO CULTURE (INFECTION SUSPECTED)
Glucose, UA: NEGATIVE mg/dL
Ketones, ur: NEGATIVE mg/dL
Leukocytes,Ua: NEGATIVE
Nitrite: NEGATIVE
Protein, ur: 30 mg/dL — AB
Specific Gravity, Urine: >1.03 — ABNORMAL HIGH (ref 1.005–1.030)
pH: 6 (ref 5.0–8.0)

## 2023-11-21 LAB — COMPREHENSIVE METABOLIC PANEL WITH GFR
ALT: 25 U/L (ref 0–44)
AST: 30 U/L (ref 15–41)
Albumin: 2.6 g/dL — ABNORMAL LOW (ref 3.5–5.0)
Alkaline Phosphatase: 120 U/L (ref 38–126)
Anion gap: 17 — ABNORMAL HIGH (ref 5–15)
BUN: 23 mg/dL (ref 8–23)
CO2: 14 mmol/L — ABNORMAL LOW (ref 22–32)
Calcium: 9.2 mg/dL (ref 8.9–10.3)
Chloride: 106 mmol/L (ref 98–111)
Creatinine, Ser: 1.33 mg/dL — ABNORMAL HIGH (ref 0.44–1.00)
GFR, Estimated: 43 mL/min — ABNORMAL LOW (ref >60)
Glucose, Bld: 97 mg/dL (ref 70–99)
Potassium: 4.4 mmol/L (ref 3.5–5.1)
Sodium: 137 mmol/L (ref 135–145)
Total Bilirubin: 1 mg/dL (ref 0.0–1.2)
Total Protein: 5.9 g/dL — ABNORMAL LOW (ref 6.5–8.1)

## 2023-11-21 LAB — I-STAT CG4 LACTIC ACID, ED
Lactic Acid, Venous: 4.4 mmol/L (ref 0.5–1.9)
Lactic Acid, Venous: 2.6 mmol/L (ref 0.5–1.9)
Lactic Acid, Venous: 1.8 mmol/L (ref 0.5–1.9)

## 2023-11-21 LAB — MAGNESIUM: Magnesium: 1.8 mg/dL (ref 1.7–2.4)

## 2023-11-21 LAB — CBC WITH DIFFERENTIAL/PLATELET
Abs Immature Granulocytes: 0.08 K/uL — ABNORMAL HIGH (ref 0.00–0.07)
Basophils Absolute: 0.1 K/uL (ref 0.0–0.1)
Basophils Relative: 0 %
Eosinophils Absolute: 0 K/uL (ref 0.0–0.5)
Eosinophils Relative: 0 %
HCT: 40.4 % (ref 36.0–46.0)
Hemoglobin: 12.8 g/dL (ref 12.0–15.0)
Immature Granulocytes: 1 %
Lymphocytes Relative: 1 %
Lymphs Abs: 0.2 K/uL — ABNORMAL LOW (ref 0.7–4.0)
MCH: 28.8 pg (ref 26.0–34.0)
MCHC: 31.7 g/dL (ref 30.0–36.0)
MCV: 91 fL (ref 80.0–100.0)
Monocytes Absolute: 0.5 K/uL (ref 0.1–1.0)
Monocytes Relative: 3 %
Neutro Abs: 15.9 K/uL — ABNORMAL HIGH (ref 1.7–7.7)
Neutrophils Relative %: 95 %
Platelets: 203 K/uL (ref 150–400)
RBC: 4.44 MIL/uL (ref 3.87–5.11)
RDW: 16.4 % — ABNORMAL HIGH (ref 11.5–15.5)
Smear Review: NORMAL
WBC: 16.7 K/uL — ABNORMAL HIGH (ref 4.0–10.5)
nRBC: 0 % (ref 0.0–0.2)

## 2023-11-21 LAB — RESP PANEL BY RT-PCR (RSV, FLU A&B, COVID)  RVPGX2
Influenza A by PCR: NEGATIVE
Influenza B by PCR: NEGATIVE
Resp Syncytial Virus by PCR: NEGATIVE
SARS Coronavirus 2 by RT PCR: NEGATIVE

## 2023-11-21 LAB — CK: Total CK: 38 U/L (ref 38–234)

## 2023-11-21 LAB — PROTIME-INR
INR: 1.4 — ABNORMAL HIGH (ref 0.8–1.2)
Prothrombin Time: 17.7 s — ABNORMAL HIGH (ref 11.4–15.2)

## 2023-11-21 LAB — ETHANOL: Alcohol, Ethyl (B): <15 mg/dL (ref <15)

## 2023-11-21 LAB — BRAIN NATRIURETIC PEPTIDE: B Natriuretic Peptide: 741.8 pg/mL — ABNORMAL HIGH (ref 0.0–100.0)

## 2023-11-21 MED ORDER — VANCOMYCIN HCL 1750 MG/350ML IV SOLN
1750.0000 mg | Freq: Once | INTRAVENOUS | Status: AC
  Administered 2023-11-21: 1750 mg via INTRAVENOUS
  Filled 2023-11-21: qty 350

## 2023-11-21 MED ORDER — ACETAMINOPHEN 650 MG RE SUPP
650.0000 mg | Freq: Four times a day (QID) | RECTAL | Status: DC | PRN
Start: 2023-11-21 — End: 2023-11-27

## 2023-11-21 MED ORDER — LACTATED RINGERS IV BOLUS
500.0000 mL | Freq: Once | INTRAVENOUS | Status: AC
  Administered 2023-11-21: 500 mL via INTRAVENOUS

## 2023-11-21 MED ORDER — ORAL CARE MOUTH RINSE: 15.0000 mL | OROMUCOSAL | Status: DC | PRN

## 2023-11-21 MED ORDER — ATORVASTATIN CALCIUM 80 MG PO TABS
80.0000 mg | ORAL_TABLET | Freq: Every day | ORAL | Status: DC
  Administered 2023-11-21 – 2023-11-27 (×7): 80 mg via ORAL
  Filled 2023-11-21 (×7): qty 1

## 2023-11-21 MED ORDER — METOPROLOL TARTRATE 12.5 MG HALF TABLET
12.5000 mg | ORAL_TABLET | Freq: Two times a day (BID) | ORAL | Status: DC
  Administered 2023-11-21 (×2): 12.5 mg via ORAL
  Filled 2023-11-21 (×2): qty 1

## 2023-11-21 MED ORDER — BISACODYL 5 MG PO TBEC: 5.0000 mg | DELAYED_RELEASE_TABLET | Freq: Every day | ORAL | Status: DC | PRN

## 2023-11-21 MED ORDER — APIXABAN 5 MG PO TABS
5.0000 mg | ORAL_TABLET | Freq: Two times a day (BID) | ORAL | Status: DC
  Administered 2023-11-21 – 2023-11-27 (×13): 5 mg via ORAL
  Filled 2023-11-21 (×13): qty 1

## 2023-11-21 MED ORDER — SODIUM CHLORIDE 0.9 % IV SOLN: INTRAVENOUS | Status: AC

## 2023-11-21 MED ORDER — IOHEXOL 350 MG/ML SOLN
75.0000 mL | Freq: Once | INTRAVENOUS | Status: AC | PRN
  Administered 2023-11-21: 75 mL via INTRAVENOUS

## 2023-11-21 MED ORDER — ONDANSETRON HCL 4 MG/2ML IJ SOLN: 4.0000 mg | Freq: Four times a day (QID) | INTRAMUSCULAR | Status: DC | PRN

## 2023-11-21 MED ORDER — ALBUMIN HUMAN 5 % IV SOLN
25.0000 g | Freq: Once | INTRAVENOUS | Status: AC
  Administered 2023-11-21: 25 g via INTRAVENOUS
  Filled 2023-11-21 (×2): qty 500

## 2023-11-21 MED ORDER — FLUCONAZOLE 100 MG PO TABS
100.0000 mg | ORAL_TABLET | Freq: Every day | ORAL | Status: DC
  Administered 2023-11-21 – 2023-11-27 (×7): 100 mg via ORAL
  Filled 2023-11-21 (×9): qty 1

## 2023-11-21 MED ORDER — ONDANSETRON HCL 4 MG PO TABS: 4.0000 mg | ORAL_TABLET | Freq: Four times a day (QID) | ORAL | Status: DC | PRN

## 2023-11-21 MED ORDER — MORPHINE SULFATE (PF) 4 MG/ML IV SOLN
4.0000 mg | Freq: Once | INTRAVENOUS | Status: AC
  Administered 2023-11-21: 4 mg via INTRAVENOUS
  Filled 2023-11-21: qty 1

## 2023-11-21 MED ORDER — PIPERACILLIN-TAZOBACTAM 3.375 G IVPB
3.3750 g | Freq: Three times a day (TID) | INTRAVENOUS | Status: DC
  Administered 2023-11-21 – 2023-11-22 (×2): 3.375 g via INTRAVENOUS
  Filled 2023-11-21 (×3): qty 50

## 2023-11-21 MED ORDER — EZETIMIBE 10 MG PO TABS
10.0000 mg | ORAL_TABLET | Freq: Every day | ORAL | Status: DC
  Administered 2023-11-21 – 2023-11-27 (×7): 10 mg via ORAL
  Filled 2023-11-21 (×7): qty 1

## 2023-11-21 MED ORDER — ACETAMINOPHEN 325 MG PO TABS
650.0000 mg | ORAL_TABLET | Freq: Four times a day (QID) | ORAL | Status: DC | PRN
  Administered 2023-11-22 – 2023-11-26 (×5): 650 mg via ORAL
  Filled 2023-11-21 (×5): qty 2

## 2023-11-21 MED ORDER — SODIUM CHLORIDE 0.9 % IV BOLUS
500.0000 mL | INTRAVENOUS | Status: DC
Start: 2023-11-21 — End: 2023-11-21

## 2023-11-21 MED ORDER — THIAMINE HCL 100 MG/ML IJ SOLN
100.0000 mg | Freq: Every day | INTRAMUSCULAR | Status: DC
  Administered 2023-11-21 – 2023-11-24 (×4): 100 mg via INTRAVENOUS
  Filled 2023-11-21 (×4): qty 2

## 2023-11-21 MED ORDER — VANCOMYCIN HCL 750 MG/150ML IV SOLN
750.0000 mg | INTRAVENOUS | Status: DC
  Administered 2023-11-22: 750 mg via INTRAVENOUS
  Filled 2023-11-21 (×2): qty 150

## 2023-11-21 MED ORDER — PIPERACILLIN-TAZOBACTAM 3.375 G IVPB 30 MIN
3.3750 g | Freq: Once | INTRAVENOUS | Status: AC
  Administered 2023-11-21: 3.375 g via INTRAVENOUS
  Filled 2023-11-21: qty 50

## 2023-11-21 NOTE — ED Notes (Signed)
 Pt incontinent. Peri care performed, barrier cream applied. New linens placed on bed.

## 2023-11-21 NOTE — ED Triage Notes (Signed)
 Pt here via ems from home where pt was standing up trying to put her pants on and fell forward. Denies hitting head or LOC. Pt is on xarelto. Pt recently discharged from [facility] for sepsis. Pt with possible UTI. HR initially 140-170 afib. Given 4mg  zofran  pta along with 300cc LR. Pt alert and oriented.

## 2023-11-21 NOTE — Progress Notes (Signed)
 Pharmacy Antibiotic Note  Christine Jacobson is a 68 y.o. female admitted on 11/21/2023 with sepsis.  Pharmacy has been consulted for Vancomycin  and Zosyn dosing.  Plan: Vancomycin  750 mg q24h starting 24h after LD.  Zosyn 3.375 g q8h      Temp (24hrs), Avg:98.3 F (36.8 C), Min:98 F (36.7 C), Max:98.6 F (37 C)  Recent Labs  Lab 11/15/23 0218 11/16/23 0309 11/21/23 0925 11/21/23 0949 11/21/23 1225  WBC 10.1 6.7 16.7*  --   --   CREATININE 1.40* 1.31* 1.33*  --   --   LATICACIDVEN  --   --   --  4.4* 2.6*    Estimated Creatinine Clearance: 40.7 mL/min (A) (by C-G formula based on SCr of 1.33 mg/dL (H)).    Allergies  Allergen Reactions   Porcine (Pork) Protein-Containing Drug Products Other (See Comments)    Religious preference     Microbiology results: 11/4 BCx:   Thank you for allowing pharmacy to be a part of this patient's care.  Shaney Deckman M Miri Jose 11/21/2023 3:20 PM

## 2023-11-21 NOTE — ED Provider Notes (Signed)
 [place] EMERGENCY DEPARTMENT AT [facility] Provider Note   CSN: [id number] Arrival date & time: 11/21/23  9082     History  Chief Complaint  Patient presents with   Christine Jacobson is a 68 y.o. female with HOCM, HFpEF, pAF on eliquis , HTN and HLD, ovarian mass c/f neoplasm,  who presents with fall, generalized weakness. Recently admitted from 11/13/23-11/17/23 for c/f sepsis d/t vulvar cellulitis. Blood cx's NGTD, treated w/ Zyvox and Diflucan. She is BIBEMS from home where pt was standing up trying to put her pants on and fell forward. Denies hitting head or LOC.  Patient states that she was discharged a few days ago from the hospital but they should not have discharged me, I am too weak to stand up, I have not been able to get up to go to the bathroom even.  Pt is on eliquis  for Afib. Pt recently discharged from [facility] for sepsis. HR initially 140-170 afib. Given 4mg  zofran  pta along with 300cc LR. A&Ox4, complaining of severe pain in her buttocks. She states her vulvar pain is better. Hasn't had any fevers, CP, SOB, cough, abdominal pain, back pain, just pain in the buttocks and severe weakness.   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   2D ECHO - EF >55%, normal   HTN (hypertension)    Hyperprolactinemia    dx in her 18, took meds, self d/c a while back   Liver function test abnormality    normal when repeated   Mild hyperlipidemia    Obesity    Palpitations    negative stress echo in November 2012 with normal LV function; mild LVH, proximal septal thickening with narrow LVOT and mild gradient; mild MR and TR; Cardionet showed PACs in November 2012   Pyoderma gangrenosa (HCC)    Shortness of breath 07/11/2011   MET TEST       Home  Medications Prior to Admission medications   Medication Sig Start Date End Date Taking? Authorizing Provider  acetaminophen  (TYLENOL ) 325 MG tablet Take 2 tablets (650 mg total) by mouth every 6 (six) hours as needed for mild pain (pain score 1-3) or fever (or Fever >/= 101). 10/14/23   Sebastian Toribio GAILS, MD  allopurinol  (ZYLOPRIM ) 100 MG tablet Take 1 tablet (100 mg total) by mouth daily. Patient not taking: Reported on 11/14/2023 10/14/23   Sebastian Toribio GAILS, MD  apixaban  (ELIQUIS ) 5 MG TABS tablet Take 1 tablet (5 mg total) by mouth 2 (two) times daily. 06/16/23   Tobb, Kardie, DO  ascorbic acid  (VITAMIN C ) 500 MG tablet Take 1 tablet (500 mg total) by mouth daily for 30 days 06/10/22   [name], MD  atorvastatin  (LIPITOR) 80 MG tablet Take 1 tablet (80 mg total) by mouth daily. Patient not taking: Reported on 11/14/2023 10/14/23   Sebastian Toribio GAILS, MD  escitalopram  (LEXAPRO ) 5 MG tablet Take 1 tablet (5 mg total) by mouth in the morning for moods. Patient not taking: Reported on 11/14/2023 10/30/23     ezetimibe  (ZETIA ) 10 MG tablet Take 1 tablet (10 mg total) by mouth daily for cholesterol. 10/30/23     fluconazole (DIFLUCAN) 100 MG tablet Take 1 tablet (100 mg total) by mouth daily for 14 days. 11/16/23 11/30/23  Anyanwu, Ugonna A, MD  furosemide  (LASIX ) 40 MG tablet Take 1 tablet (40 mg total) by mouth in the morning. 10/14/23   Sebastian Toribio GAILS, MD  linezolid (ZYVOX) 600 MG tablet Take 1 tablet (600 mg total) by mouth every 12 (twelve) hours for 12 days. 11/16/23 11/28/23  Gonfa, Taye T, MD  metoprolol  succinate (TOPROL -XL) 50 MG 24 hr tablet Take 1 tablet (50 mg total) by mouth daily. Take with or immediately following a meal. 11/16/23   Gonfa, Taye T, MD  pantoprazole  (PROTONIX ) 40 MG tablet Take 1 tablet (40 mg total) by mouth daily. 10/14/23   Sebastian Toribio GAILS, MD  Potassium Chloride  ER 20 MEQ TBCR Take 1 tablet (20 mEq total) by mouth every other day. 05/31/23   Tobb, Kardie,  DO  Zinc  Sulfate 220 (50 Zn) MG TABS Take 1 tablet (220 mg total) by mouth daily for 30 days 06/10/22   [name], MD  QUEtiapine  (SEROQUEL ) 25 MG tablet Take 0.5 tablets (12.5 mg total) by mouth at bedtime for 7 days, THEN 1 tablet (25 mg total) at bedtime for mood and insomnia. Patient not taking: Reported on 05/11/2023 04/20/23 07/13/23        Allergies    Porcine (pork) protein-containing drug products    Review of Systems   Review of Systems A 10 point review of systems was performed and is negative unless otherwise reported in HPI.  Physical Exam Updated Vital Signs BP 104/74   Pulse (!) 114   Temp 98.6 F (37 C)   Resp 13   SpO2 95%  Physical Exam General: Uncomfortable appearing female, lying in bed.  HEENT: PERRLA, Sclera anicteric, MMM, trachea midline.  Cardiology: Irregularly irregular tachycardic rate, no murmurs/rubs/gallops.  Resp: Normal respiratory rate and effort. CTAB, no wheezes, rhonchi, crackles.  Abd: Soft, non-tender, non-distended. No rebound tenderness or guarding.  GU: Deferred. MSK: No peripheral edema or signs of trauma. Extremities without deformity or TTP. No cyanosis or clubbing. Skin: warm, dry. Back: No CVA tenderness. Large surface area stage 1 and stage 2 pressure ulcers on sacrum and buttocks, pictured below, with areas of erythema and significant TTP. No obvious purulent drainage or abscess.  Neuro: A&Ox4, CNs II-XII grossly intact. MAEs. Sensation grossly intact.  Psych: Normal mood and affect.        ED Results / Procedures / Treatments   Labs (all labs ordered are listed, but only abnormal results are displayed) Labs Reviewed  CBC WITH DIFFERENTIAL/PLATELET - Abnormal; Notable for the following components:      Result Value   WBC 16.7 (*)    RDW 16.4 (*)    Neutro Abs 15.9 (*)    Lymphs Abs 0.2 (*)    Abs Immature Granulocytes 0.08 (*)    All other components within normal limits  COMPREHENSIVE METABOLIC PANEL WITH GFR -  Abnormal; Notable for the following components:   CO2 14 (*)    Creatinine, Ser 1.33 (*)    Total Protein 5.9 (*)    Albumin  2.6 (*)    GFR, Estimated 43 (*)    Anion gap 17 (*)    All other components within normal limits  URINALYSIS, W/ REFLEX TO CULTURE (INFECTION SUSPECTED) - Abnormal; Notable for the following components:   Specific Gravity, Urine >1.030 (*)    Hgb urine dipstick TRACE (*)    Bilirubin Urine MODERATE (*)    Protein, ur 30 (*)    Bacteria, UA RARE (*)    All other  components within normal limits  BRAIN NATRIURETIC PEPTIDE - Abnormal; Notable for the following components:   B Natriuretic Peptide 741.8 (*)    All other components within normal limits  I-STAT CG4 LACTIC ACID, ED - Abnormal; Notable for the following components:   Lactic Acid, Venous 4.4 (*)    All other components within normal limits  I-STAT CG4 LACTIC ACID, ED - Abnormal; Notable for the following components:   Lactic Acid, Venous 2.6 (*)    All other components within normal limits  RESP PANEL BY RT-PCR (RSV, FLU A&B, COVID)  RVPGX2  CULTURE, BLOOD (ROUTINE X 2)  CULTURE, BLOOD (ROUTINE X 2)  CK  PROTIME-INR  MAGNESIUM     EKG EKG Interpretation Date/Time:  Tuesday November 21 2023 90:73:75 EST Ventricular Rate:  106 PR Interval:    QRS Duration:  96 QT Interval:  331 QTC Calculation: 440 R Axis:   3  Text Interpretation: Atrial fibrillation Abnormal T, consider ischemia, lateral leads Confirmed by Franklyn Gills [phone]) on 11/21/2023 10:48:36 AM  Radiology CT ABDOMEN PELVIS W CONTRAST Result Date: 11/21/2023 EXAM: CT ABDOMEN AND PELVIS WITH CONTRAST 11/21/2023 02:33:50 PM TECHNIQUE: CT of the abdomen and pelvis was performed with the administration of 75 mL of iohexol  (OMNIPAQUE ) 350 MG/ML injection. Multiplanar reformatted images are provided for review. Automated exposure control, iterative reconstruction, and/or weight-based adjustment of the mA/kV was utilized to reduce the  radiation dose to as low as reasonably achievable. COMPARISON: CT 11/13/2023. CLINICAL HISTORY: Sepsis. FINDINGS: LOWER CHEST: No acute abnormality. LIVER: The liver is unremarkable. GALLBLADDER AND BILE DUCTS: Gallbladder is unremarkable. No biliary ductal dilatation. SPLEEN: No acute abnormality. PANCREAS: No acute abnormality. ADRENAL GLANDS: No acute abnormality. KIDNEYS, URETERS AND BLADDER: No stones in the kidneys or ureters. No hydronephrosis. No perinephric or periureteral stranding. Urinary bladder is unremarkable. GI AND BOWEL: Stomach demonstrates no acute abnormality. There is no bowel obstruction. Normal appendix. PERITONEUM AND RETROPERITONEUM: No ascites. No free air. VASCULATURE: Aorta is normal in caliber. LYMPH NODES: No lymphadenopathy. REPRODUCTIVE ORGANS: Large cystic lesions of the ovaries are redemonstrated. The cystic lesion in the right ovary measures 11.8 cm and in the left ovary measures 9.0 cm. These are not substantially changed from 11/13/2023. BONES AND SOFT TISSUES: No acute osseous abnormality. No focal soft tissue abnormality. IMPRESSION: 1. No acute abnormality in the abdomen or pelvis. 2. Large bilateral ovarian cystic lesions, right 11.8 cm and left 9.0 cm, not substantially changed from 11/13/23. Findings are concerning for low-grade cystic neoplasm; recommend gynecology consultation if not already obtained. Consider nonemergent pelvic MR with IV contrast if clinically appropriate. Electronically signed by: Norman Gatlin MD 11/21/2023 03:01 PM EST RP Workstation: [id number]   DG Chest Portable 1 View Result Date: 11/21/2023 EXAM: 1 VIEW(S) XRAY OF THE CHEST 11/21/2023 10:34:00 AM COMPARISON: None available. CLINICAL HISTORY: sepsis, cough? FINDINGS: LUNGS AND PLEURA: No focal pulmonary opacity. No pulmonary edema. No pleural effusion. No pneumothorax. HEART AND MEDIASTINUM: Mild cardiomegaly. BONES AND SOFT TISSUES: No acute osseous abnormality. IMPRESSION: 1. No acute  cardiopulmonary process. Electronically signed by: Norleen Boxer MD 11/21/2023 11:45 AM EST RP Workstation: [id number]    Procedures .Critical Care  Performed by: Franklyn Gills SAILOR, MD Authorized by: Franklyn Gills SAILOR, MD   Critical care provider statement:    Critical care time (minutes):  30   Critical care was necessary to treat or prevent imminent or life-threatening deterioration of the following conditions:  Circulatory failure, sepsis and dehydration   Critical care was time spent  personally by me on the following activities:  Development of treatment plan with patient or surrogate, discussions with consultants, evaluation of patient's response to treatment, examination of patient, ordering and review of laboratory studies, ordering and review of radiographic studies, ordering and performing treatments and interventions, pulse oximetry, re-evaluation of patient's condition and review of old charts     Medications Ordered in ED Medications  sodium chloride  0.9 % bolus 500 mL (has no administration in time range)  0.9 %  sodium chloride  infusion (has no administration in time range)  apixaban  (ELIQUIS ) tablet 5 mg (has no administration in time range)  atorvastatin  (LIPITOR) tablet 80 mg (has no administration in time range)  acetaminophen  (TYLENOL ) tablet 650 mg (has no administration in time range)    Or  acetaminophen  (TYLENOL ) suppository 650 mg (has no administration in time range)  bisacodyl  (DULCOLAX) EC tablet 5 mg (has no administration in time range)  ondansetron  (ZOFRAN ) tablet 4 mg (has no administration in time range)    Or  ondansetron  (ZOFRAN ) injection 4 mg (has no administration in time range)  lactated ringers  bolus 500 mL (0 mLs Intravenous Stopped 11/21/23 1310)  vancomycin  (VANCOREADY) IVPB 1750 mg/350 mL (0 mg Intravenous Stopped 11/21/23 1403)  piperacillin-tazobactam (ZOSYN) IVPB 3.375 g (0 g Intravenous Stopped 11/21/23 1202)  morphine  (PF) 4 MG/ML injection 4 mg  (4 mg Intravenous Given 11/21/23 1054)  lactated ringers  bolus 500 mL (0 mLs Intravenous Stopped 11/21/23 1445)  lactated ringers  bolus 500 mL (500 mLs Intravenous New Bag/Given 11/21/23 1445)  iohexol  (OMNIPAQUE ) 350 MG/ML injection 75 mL (75 mLs Intravenous Contrast Given 11/21/23 1434)    ED Course/ Medical Decision Making/ A&P                          Medical Decision Making Amount and/or Complexity of Data Reviewed Labs: ordered. Decision-making details documented in ED Course. Radiology: ordered. Decision-making details documented in ED Course.  Risk Prescription drug management. Decision regarding hospitalization.    This patient presents to the ED for concern of generalized weakness, buttock pain; this involves an extensive number of treatment options, and is a complaint that carries with it a high risk of complications and morbidity.  I considered the following differential and admission for this acute, potentially life threatening condition.   MDM:    Patient did fall at home, reports no head trauma or injuries from fall, just generalized weakness that caused it. Pt w/ tachycardia, suspected source of infection (possible cellulitis of buttocks), leukocytosis, and elevated lactic acid. Blood cultures, fluids, and abx ordered.  Patient also likely w/ severe dehydration, as she couldn't get up from her chair to drink water , urine is very concentrated. Could have elevated HR and lactic from dehydration as well. HR w/ Afib w/ RVR on EKG and telemetry; no chest pain or SOB or signs of ischemia. She is given fluids, 1L here in ED in addition to 300 cc by EMS for a total of 1.3L with some improvement in HR. BP is ~90-100 systolic so will avoid AV nodal blocking agents for the time being. No UTI on UA, no PNA on CXR. Patient will need admission to medicine.  Clinical Course as of 11/21/23 1510  Tue Nov 21, 2023  1016 Lactic Acid, Venous(!!): 4.4 Will give 500 cc fluid [HN]  1113 WBC(!):  16.7 +leukocytosis w/ left shift [HN]  1150 DG Chest Portable 1 View 1. No acute cardiopulmonary process. [HN]  1237 Lactic  Acid, Venous(!!): 2.6 Improved lactic [HN]  1237 Specific Gravity, Urine(!): >1.030 Very dehydrated [HN]  1336 CK Total: 38 neg [HN]  1337 Giving an additional 500cc LR bolus. C/f cellulitis over buttocks or pressure ulcers stage 2. Urine is not infected, no PNA, no abd pain. Patient will need admission for c/f cellulitis w/ sepsis, gen weakness, dehydration, and Afib w/ RVR. Will avoid metoprolol  IV for now given BP ~100 systolic and likely compensatory rate as well. [HN]    Clinical Course User Index [HN] Franklyn Sid SAILOR, MD    Labs: I Ordered, and personally interpreted labs.  The pertinent results include:  those listed above  Imaging Studies ordered: I ordered imaging studies including CXR I independently visualized and interpreted imaging. I agree with the radiologist interpretation  Additional history obtained from chart review.    Cardiac Monitoring: The patient was maintained on a cardiac monitor.  I personally viewed and interpreted the cardiac monitored which showed an underlying rhythm of: afib w/ RR  Reevaluation: After the interventions noted above, I reevaluated the patient and found that they have :improved  Social Determinants of Health: Lives independently  Disposition:  admit to medicine  Co morbidities that complicate the patient evaluation  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   2D ECHO - EF >55%, normal   HTN (hypertension)    Hyperprolactinemia    dx in her 18, took meds, self d/c a while back   Liver function test abnormality    normal when repeated   Mild hyperlipidemia    Obesity    Palpitations     negative stress echo in November 2012 with normal LV function; mild LVH, proximal septal thickening with narrow LVOT and mild gradient; mild MR and TR; Cardionet showed PACs in November 2012   Pyoderma gangrenosa (HCC)    Shortness of breath 07/11/2011   MET TEST     Medicines Meds ordered this encounter  Medications   lactated ringers  bolus 500 mL   vancomycin  (VANCOREADY) IVPB 1750 mg/350 mL    Indication::   Sepsis   piperacillin-tazobactam (ZOSYN) IVPB 3.375 g    Antibiotic Indication::   Sepsis   morphine  (PF) 4 MG/ML injection 4 mg   lactated ringers  bolus 500 mL   lactated ringers  bolus 500 mL   iohexol  (OMNIPAQUE ) 350 MG/ML injection 75 mL   sodium chloride  0.9 % bolus 500 mL   0.9 %  sodium chloride  infusion   apixaban  (ELIQUIS ) tablet 5 mg   atorvastatin  (LIPITOR) tablet 80 mg   OR Linked Order Group    acetaminophen  (TYLENOL ) tablet 650 mg    acetaminophen  (TYLENOL ) suppository 650 mg   bisacodyl  (DULCOLAX) EC tablet 5 mg   OR Linked Order Group    ondansetron  (ZOFRAN ) tablet 4 mg    ondansetron  (ZOFRAN ) injection 4 mg    I have reviewed the patients home medicines and have made adjustments as needed  Problem List / ED Course: Problem List Items Addressed This Visit       Other   Cellulitis   Dehydration   Other Visit Diagnoses       Fall in home, initial encounter    -  Primary     Generalized weakness         Pressure injury  of buttock, stage 2, unspecified laterality (HCC)         Atrial fibrillation with rapid ventricular response (HCC)       Relevant Medications   apixaban  (ELIQUIS ) tablet 5 mg (Start on 11/21/2023  3:15 PM)   atorvastatin  (LIPITOR) tablet 80 mg (Start on 11/21/2023  3:15 PM)                   This note was created using dictation software, which may contain spelling or grammatical errors.    Franklyn Sid SAILOR, MD 11/21/23 [phone]

## 2023-11-21 NOTE — ED Notes (Signed)
 Patient is resting comfortably.

## 2023-11-21 NOTE — H&P (Signed)
 History and Physical    Patient: Christine Jacobson FMW:[id number] DOB: [birth year] DOA: 11/21/2023 DOS: the patient was seen and examined on 11/21/2023 . PCP: [name], NP  Patient coming from: Home Chief complaint: Chief Complaint  Patient presents with   Fall   HPI:  Christine Jacobson is a 68 y.o. female with past medical history  of  HOCM, HFpEF, HTN and HLD , presenting for syncope and fall and at bedside is lethargic not able to give history.  Chart review shows patient was admitted twice since October 11, 2023, for acute on chronic CHF with preserved ejection fraction, after being discharged on October 15, 2023 patient had another ED visit on 27th October where she was admitted for severe sepsis secondary to a yeast infection and vulvar cellulitis along with A-fib RVR, CHF AKI found to have an ovarian mass and was recommended for GYN evaluation.  Patient returns to us  today on November 21, 2023 for syncopal episode initial presentation with a heart rate of 140-170 seen A-fib RVR.  ED Course:  Vital signs in the ED were notable for the following:  Vitals:   11/21/23 1230 11/21/23 1415 11/21/23 1447 11/21/23 1457  BP: 106/79 107/81 104/74   Pulse: (!) 123 (!) 122 (!) 114   Temp:    98.6 F (37 C)  Resp: 15 16 13    SpO2: 93% 94% 95%   TempSrc:       >>ED evaluation thus far shows:  Abnormal CMP with a bicarb of 14 anion gap of 17, creatinine 1.33 eGFR of 43 normal LFTs. Lactic acid of 4.4. CBC shows a white count of 16.7 hemoglobin of 12.8 platelets of 203. Viral panel is negative for flu RSV and COVID. Urinalysis shows moderate bilirubin trace hemoglobin 0.25 RBCs 30 protein rare bacteria 0-5 WBCs. Blood cultures collected in the ED.  >>While in the ED patient received the following: Patient given LR 500 which I have repeat in addition to the initial 500 morphine  4 mg by EDMD Zosyn and vancomycin  for broad-spectrum coverage.  Review of Systems  Unable to  perform ROS: Acuity of condition  Musculoskeletal:  Positive for falls.  Neurological:  Positive for loss of consciousness.   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   2D ECHO - EF >55%, normal   HTN (hypertension)    Hyperprolactinemia    dx in her 18, took meds, self d/c a while back   Liver function test abnormality    normal when repeated   Mild hyperlipidemia    Obesity    Palpitations    negative stress echo in November 2012 with normal LV function; mild LVH, proximal septal thickening with narrow LVOT and mild gradient; mild MR and TR; Cardionet showed PACs in November 2012   Pyoderma gangrenosa (HCC)    Shortness of breath 07/11/2011   MET TEST   Past Surgical History:  Procedure Laterality Date   BIOPSY  08/10/2022   Procedure: BIOPSY;  Surgeon: Burnette Fallow, MD;  Location: [facility];  Service: Gastroenterology;;   CARDIOVERSION N/A 02/01/2022   Procedure: CARDIOVERSION;  Surgeon: Sheena Pugh, DO;  Location: [facility];  Service: Cardiovascular;  Laterality: N/A;   CARDIOVERSION N/A 09/20/2022   Procedure: CARDIOVERSION;  Surgeon: Cherrie Toribio SAUNDERS, MD;  Location: [facility]  INVASIVE CV LAB;  Service: Cardiovascular;  Laterality: N/A;   COLONOSCOPY WITH PROPOFOL  Left 08/10/2022   Procedure: COLONOSCOPY WITH PROPOFOL ;  Surgeon: Burnette Fallow, MD;  Location: [facility];  Service: Gastroenterology;  Laterality: Left;   ESOPHAGOGASTRODUODENOSCOPY (EGD) WITH PROPOFOL  Left 08/10/2022   Procedure: ESOPHAGOGASTRODUODENOSCOPY (EGD) WITH PROPOFOL ;  Surgeon: Burnette Fallow, MD;  Location: [facility];  Service: Gastroenterology;  Laterality: Left;   POLYPECTOMY  08/10/2022   Procedure: POLYPECTOMY;  Surgeon: Burnette Fallow, MD;  Location: [facility];  Service:  Gastroenterology;;   RIGHT HEART CATH N/A 10/15/2021   Procedure: RIGHT HEART CATH;  Surgeon: Elmira Newman PARAS, MD;  Location: [facility] INVASIVE CV LAB;  Service: Cardiovascular;  Laterality: N/A;   RIGHT/LEFT HEART CATH AND CORONARY ANGIOGRAPHY N/A 10/13/2023   Procedure: RIGHT/LEFT HEART CATH AND CORONARY ANGIOGRAPHY;  Surgeon: Rolan Ezra RAMAN, MD;  Location: [facility] INVASIVE CV LAB;  Service: Cardiovascular;  Laterality: N/A;   SKIN GRAFT  2006   porcine, R leg   TEE WITHOUT CARDIOVERSION N/A 09/20/2022   Procedure: TRANSESOPHAGEAL ECHOCARDIOGRAM;  Surgeon: Cherrie Toribio SAUNDERS, MD;  Location: [facility] INVASIVE CV LAB;  Service: Cardiovascular;  Laterality: N/A;    reports that she has quit smoking. Her smoking use included cigarettes. She has a 4 pack-year smoking history. She has never used smokeless tobacco. She reports that she does not currently use alcohol after a past usage of about 1.0 standard drink of alcohol per week. She reports that she does not use drugs. Allergies  Allergen Reactions   Porcine (Pork) Protein-Containing Drug Products Other (See Comments)    Religious preference   Family History  Problem Relation Age of Onset   Heart disease Mother        endocarditis   Ovarian cancer Mother    Emphysema Father    Coronary artery disease Other        F in his 21   Diabetes Other        GP   Cancer Neg Hx    Prior to Admission medications   Medication Sig Start Date End Date Taking? Authorizing Provider  acetaminophen  (TYLENOL ) 325 MG tablet Take 2 tablets (650 mg total) by mouth every 6 (six) hours as needed for mild pain (pain score 1-3) or fever (or Fever >/= 101). 10/14/23   Sebastian Toribio GAILS, MD  allopurinol  (ZYLOPRIM ) 100 MG tablet Take 1 tablet (100 mg total) by mouth daily. Patient not taking: Reported on 11/14/2023 10/14/23   Sebastian Toribio GAILS, MD  apixaban  (ELIQUIS ) 5 MG TABS tablet Take 1 tablet (5 mg total) by mouth 2 (two) times daily. 06/16/23   Tobb, Kardie, DO  ascorbic  acid (VITAMIN C ) 500 MG tablet Take 1 tablet (500 mg total) by mouth daily for 30 days 06/10/22   [name], MD  atorvastatin  (LIPITOR) 80 MG tablet Take 1 tablet (80 mg total) by mouth daily. Patient not taking: Reported on 11/14/2023 10/14/23   Sebastian Toribio GAILS, MD  escitalopram  (LEXAPRO ) 5 MG tablet Take 1 tablet (5 mg total) by mouth in the morning for moods. Patient not taking: Reported on 11/14/2023 10/30/23     ezetimibe  (ZETIA ) 10 MG tablet Take 1 tablet (10 mg total) by mouth daily for cholesterol. 10/30/23     fluconazole (DIFLUCAN) 100 MG tablet Take 1 tablet (100 mg total) by mouth daily for 14 days. 11/16/23 11/30/23  Anyanwu, Ugonna A, MD  furosemide  (LASIX ) 40 MG tablet Take 1 tablet (40 mg total) by mouth in the morning.  10/14/23   Sebastian Toribio GAILS, MD  linezolid (ZYVOX) 600 MG tablet Take 1 tablet (600 mg total) by mouth every 12 (twelve) hours for 12 days. 11/16/23 11/28/23  Gonfa, Taye T, MD  metoprolol  succinate (TOPROL -XL) 50 MG 24 hr tablet Take 1 tablet (50 mg total) by mouth daily. Take with or immediately following a meal. 11/16/23   Gonfa, Taye T, MD  pantoprazole  (PROTONIX ) 40 MG tablet Take 1 tablet (40 mg total) by mouth daily. 10/14/23   Sebastian Toribio GAILS, MD  Potassium Chloride  ER 20 MEQ TBCR Take 1 tablet (20 mEq total) by mouth every other day. 05/31/23   Tobb, Kardie, DO  Zinc  Sulfate 220 (50 Zn) MG TABS Take 1 tablet (220 mg total) by mouth daily for 30 days 06/10/22   [name], MD  QUEtiapine  (SEROQUEL ) 25 MG tablet Take 0.5 tablets (12.5 mg total) by mouth at bedtime for 7 days, THEN 1 tablet (25 mg total) at bedtime for mood and insomnia. Patient not taking: Reported on 05/11/2023 04/20/23 07/13/23                                                                                   Vitals:   11/21/23 1230 11/21/23 1415 11/21/23 1447 11/21/23 1457  BP: 106/79 107/81 104/74   Pulse: (!) 123 (!) 122 (!) 114   Resp: 15 16 13    Temp:    98.6 F (37 C)   TempSrc:      SpO2: 93% 94% 95%    Physical Exam Vitals reviewed.  Constitutional:      General: She is not in acute distress.    Appearance: She is ill-appearing.  HENT:     Head: Normocephalic.     Mouth/Throat:     Mouth: Mucous membranes are dry.  Eyes:     General: Lids are normal.     Extraocular Movements: Extraocular movements intact.  Cardiovascular:     Rate and Rhythm: Normal rate. Rhythm irregular.     Pulses: Normal pulses.     Heart sounds: Normal heart sounds.  Pulmonary:     Effort: Pulmonary effort is normal.     Breath sounds: Normal breath sounds.  Abdominal:     General: Bowel sounds are decreased. There is distension and abdominal bruit.     Palpations: Abdomen is soft. There is no mass.     Tenderness: There is no abdominal tenderness. There is no guarding.      Comments: Abdominal and renal bruit . BS hypoactive.   Musculoskeletal:     Right lower leg: No edema.     Left lower leg: No edema.  Neurological:     General: No focal deficit present.     Mental Status: She is alert and oriented to person, place, and time.     Labs on Admission: I have personally reviewed following labs and imaging studies CBC: Recent Labs  Lab 11/15/23 0218 11/16/23 0309 11/21/23 0925  WBC 10.1 6.7 16.7*  NEUTROABS  --   --  15.9*  HGB 10.9* 10.5* 12.8  HCT 34.0* 34.3* 40.4  MCV 92.1 94.8 91.0  PLT 223 214 203   Basic  Metabolic Panel: Recent Labs  Lab 11/15/23 0218 11/16/23 0309 11/21/23 0925  NA 136 138 137  K 3.9 4.1 4.4  CL 109 108 106  CO2 18* 20* 14*  GLUCOSE 126* 106* 97  BUN 22 19 23   CREATININE 1.40* 1.31* 1.33*  CALCIUM  8.7* 8.7* 9.2  MG 1.8 1.9  --   PHOS 2.3* 3.1  --    GFR: Estimated Creatinine Clearance: 40.7 mL/min (A) (by C-G formula based on SCr of 1.33 mg/dL (H)). Liver Function Tests: Recent Labs  Lab 11/15/23 0218 11/16/23 0309 11/21/23 0925  AST 44*  --  30  ALT 44  --  25  ALKPHOS 175*  --  120  BILITOT 0.9  --  1.0   PROT 5.7*  --  5.9*  ALBUMIN  2.6* 2.4* 2.6*   No results for input(s): LIPASE, AMYLASE in the last 168 hours. No results for input(s): AMMONIA in the last 168 hours. Recent Labs    10/11/23 0935 10/12/23 0458 10/13/23 0431 10/14/23 0514 10/15/23 0504 11/13/23 0920 11/13/23 0921 11/15/23 0218 11/16/23 0309 11/21/23 0925  BUN 24* 27* 33* 31* 34* 18 22 22 19 23   CREATININE 1.01* 1.21* 1.23* 1.16* 1.27* 1.08* 1.10* 1.40* 1.31* 1.33*    Cardiac Enzymes: Recent Labs  Lab 11/21/23 1215  CKTOTAL 38   BNP (last 3 results) Recent Labs    10/11/23 0935 10/12/23 0458  PROBNP 4,647.0* 4,823.0*   HbA1C: No results for input(s): HGBA1C in the last 72 hours. CBG: No results for input(s): GLUCAP in the last 168 hours. Lipid Profile: No results for input(s): CHOL, HDL, LDLCALC, TRIG, CHOLHDL, LDLDIRECT in the last 72 hours. Thyroid Function Tests: No results for input(s): TSH, T4TOTAL, FREET4, T3FREE, THYROIDAB in the last 72 hours. Anemia Panel: No results for input(s): VITAMINB12, FOLATE, FERRITIN, TIBC, IRON , RETICCTPCT in the last 72 hours. Urine analysis:    Component Value Date/Time   COLORURINE YELLOW 11/21/2023 1124   APPEARANCEUR CLEAR 11/21/2023 1124   LABSPEC >1.030 (H) 11/21/2023 1124   PHURINE 6.0 11/21/2023 1124   GLUCOSEU NEGATIVE 11/21/2023 1124   HGBUR TRACE (A) 11/21/2023 1124   BILIRUBINUR MODERATE (A) 11/21/2023 1124   KETONESUR NEGATIVE 11/21/2023 1124   PROTEINUR 30 (A) 11/21/2023 1124   NITRITE NEGATIVE 11/21/2023 1124   LEUKOCYTESUR NEGATIVE 11/21/2023 1124   Radiological Exams on Admission: CT ABDOMEN PELVIS W CONTRAST Result Date: 11/21/2023 EXAM: CT ABDOMEN AND PELVIS WITH CONTRAST 11/21/2023 02:33:50 PM TECHNIQUE: CT of the abdomen and pelvis was performed with the administration of 75 mL of iohexol  (OMNIPAQUE ) 350 MG/ML injection. Multiplanar reformatted images are provided for review. Automated  exposure control, iterative reconstruction, and/or weight-based adjustment of the mA/kV was utilized to reduce the radiation dose to as low as reasonably achievable. COMPARISON: CT 11/13/2023. CLINICAL HISTORY: Sepsis. FINDINGS: LOWER CHEST: No acute abnormality. LIVER: The liver is unremarkable. GALLBLADDER AND BILE DUCTS: Gallbladder is unremarkable. No biliary ductal dilatation. SPLEEN: No acute abnormality. PANCREAS: No acute abnormality. ADRENAL GLANDS: No acute abnormality. KIDNEYS, URETERS AND BLADDER: No stones in the kidneys or ureters. No hydronephrosis. No perinephric or periureteral stranding. Urinary bladder is unremarkable. GI AND BOWEL: Stomach demonstrates no acute abnormality. There is no bowel obstruction. Normal appendix. PERITONEUM AND RETROPERITONEUM: No ascites. No free air. VASCULATURE: Aorta is normal in caliber. LYMPH NODES: No lymphadenopathy. REPRODUCTIVE ORGANS: Large cystic lesions of the ovaries are redemonstrated. The cystic lesion in the right ovary measures 11.8 cm and in the left ovary measures 9.0 cm. These are  not substantially changed from 11/13/2023. BONES AND SOFT TISSUES: No acute osseous abnormality. No focal soft tissue abnormality. IMPRESSION: 1. No acute abnormality in the abdomen or pelvis. 2. Large bilateral ovarian cystic lesions, right 11.8 cm and left 9.0 cm, not substantially changed from 11/13/23. Findings are concerning for low-grade cystic neoplasm; recommend gynecology consultation if not already obtained. Consider nonemergent pelvic MR with IV contrast if clinically appropriate. Electronically signed by: Norman Gatlin MD 11/21/2023 03:01 PM EST RP Workstation: [id number]   DG Chest Portable 1 View Result Date: 11/21/2023 EXAM: 1 VIEW(S) XRAY OF THE CHEST 11/21/2023 10:34:00 AM COMPARISON: None available. CLINICAL HISTORY: sepsis, cough? FINDINGS: LUNGS AND PLEURA: No focal pulmonary opacity. No pulmonary edema. No pleural effusion. No pneumothorax. HEART AND  MEDIASTINUM: Mild cardiomegaly. BONES AND SOFT TISSUES: No acute osseous abnormality. IMPRESSION: 1. No acute cardiopulmonary process. Electronically signed by: Norleen Boxer MD 11/21/2023 11:45 AM EST RP Workstation: [id number]   Data Reviewed: Relevant notes from primary care and specialist visits, past discharge summaries as available in EHR, including Care Everywhere . Prior diagnostic testing as pertinent to current admission diagnoses, Updated medications and problem lists for reconciliation .ED course, including vitals, labs, imaging, treatment and response to treatment,Triage notes, nursing and pharmacy notes and ED provider's notes.Notable results as noted in HPI.Discussed case with EDMD/ ED APP/ or Specialty MD on call and as needed.  Assessment & Plan    >>Syncope/ Collapse: 2/2 to hypotension from a.fib rvr and septic shock . We will cont with IVF 500 ml  every 4 hours to total at least 2 L. Fall precaution.     >> Cellulitis/Sepsis with organ dysfunction: 2/2 to extensive severe cellulitis of both the thighs posteriorly, and her lower legs.Patient appears weak and unable to take care of herself however states that she lives alone and is able to do ADLs. Will continue with broad-spectrum IV antibiotic coverage follow-up blood cultures, tailor antibiotics as deemed appropriate. I suspect pt will need Rehab  and or SNF depending on PT eval prior to discharge.  Repeat lactic acid level and ensure resolution.  Additional NS bolus 500 and albumin  x 1.     >> A-fib RVR: Low dose metoprolol  and mag level pending.  Cont eliquis .    >> Chronic diastolic congestive heart failure/ HOCM: Cautious IVF hydration. Close monitoring of volume and strict I/O.    >> Ovarian mass: Bilateral ovarian cyst concerning for malignancy needs GYN evaluation and follow-up once stable.    DVT prophylaxis:  Eliquis .  Consults:  None.  Advance Care Planning:    Code Status: Full Code   Family  Communication:  None.  Disposition Plan:  To be determined.  Severity of Illness: The appropriate patient status for this patient is INPATIENT. Inpatient status is judged to be reasonable and necessary in order to provide the required intensity of service to ensure the patient's safety. The patient's presenting symptoms, physical exam findings, and initial radiographic and laboratory data in the context of their chronic comorbidities is felt to place them at high risk for further clinical deterioration. Furthermore, it is not anticipated that the patient will be medically stable for discharge from the hospital within 2 midnights of admission.   * I certify that at the point of admission it is my clinical judgment that the patient will require inpatient hospital care spanning beyond 2 midnights from the point of admission due to high intensity of service, high risk for further deterioration and high frequency of surveillance required.*  Unresulted Labs (From admission, onward)     Start     Ordered   11/22/23 0500  Protime-INR  Tomorrow morning,   R        11/21/23 1509   11/22/23 0500  Cortisol-am, blood  Tomorrow morning,   R        11/21/23 1509   11/22/23 0500  Comprehensive metabolic panel  Tomorrow morning,   R        11/21/23 1509   11/22/23 0500  CBC  Tomorrow morning,   R        11/21/23 1509   11/21/23 1538  Ethanol  Add-on,   AD        11/21/23 1537   11/21/23 1538  Vitamin B12  Add-on,   AD        11/21/23 1537   11/21/23 1350  Magnesium   Add-on,   AD        11/21/23 1349   11/21/23 1300  Protime-INR  Once,   STAT        11/21/23 1300   11/21/23 0925  Blood culture (routine x 2)  BLOOD CULTURE X 2,   R      11/21/23 0925            Meds ordered this encounter  Medications   lactated ringers  bolus 500 mL   vancomycin  (VANCOREADY) IVPB 1750 mg/350 mL    Indication::   Sepsis   piperacillin-tazobactam (ZOSYN) IVPB 3.375 g    Antibiotic Indication::   Sepsis    morphine  (PF) 4 MG/ML injection 4 mg   lactated ringers  bolus 500 mL   lactated ringers  bolus 500 mL   iohexol  (OMNIPAQUE ) 350 MG/ML injection 75 mL   DISCONTD: sodium chloride  0.9 % bolus 500 mL   0.9 %  sodium chloride  infusion   apixaban  (ELIQUIS ) tablet 5 mg   atorvastatin  (LIPITOR) tablet 80 mg   OR Linked Order Group    acetaminophen  (TYLENOL ) tablet 650 mg    acetaminophen  (TYLENOL ) suppository 650 mg   bisacodyl  (DULCOLAX) EC tablet 5 mg   OR Linked Order Group    ondansetron  (ZOFRAN ) tablet 4 mg    ondansetron  (ZOFRAN ) injection 4 mg   albumin  human 5 % solution 25 g   ezetimibe  (ZETIA ) tablet 10 mg   fluconazole (DIFLUCAN) tablet 100 mg   metoprolol  tartrate (LOPRESSOR ) tablet 12.5 mg   piperacillin-tazobactam (ZOSYN) IVPB 3.375 g    Antibiotic Indication::   Sepsis   vancomycin  (VANCOREADY) IVPB 750 mg/150 mL    Indication::   Sepsis   thiamine (VITAMIN B1) injection 100 mg     Orders Placed This Encounter  Procedures   Critical Care   Blood culture (routine x 2)   Resp panel by RT-PCR (RSV, Flu A&B, Covid) Anterior Nasal Swab   DG Chest Portable 1 View   CT ABDOMEN PELVIS W CONTRAST   CBC with Differential   Comprehensive metabolic panel   Urinalysis, w/ Reflex to Culture (Infection Suspected) -Urine, Clean Catch   Brain natriuretic peptide   CK   Protime-INR   Magnesium    Protime-INR   Cortisol-am, blood   Comprehensive metabolic panel   CBC   Ethanol   Vitamin B12   ED Cardiac monitoring   Catherize if unable to void   Wound care   Vital signs   Notify physician (specify)   Mobility Protocol: No Restrictions   Refer to Sidebar Report Mobility Protocol for Adult Inpatient   If lactate (  lactic acid) >2, verify repeat lactic acid order has been placed to be drawn   Document vital signs within 1-hour of fluid bolus completion and notify provider of bolus completion   Vital signs   Vital signs   RN to call RRT (rapid response team)   Notify physician  (specify) If patient in A-Fib, change in heart rhythm, or HR > 125 beat/min   Initiate Adult Central Line Maintenance and Catheter Clearance Protocol for patients with central line (CVC, PICC, Port, Hemodialysis, Trialysis)   Apply Sepsis Care Plan   Refer to Sidebar Report: Sepsis Bundle ED/IP   Assess and Document Glasgow Coma Scale   Initiate Oral Care Protocol   Initiate Carrier Fluid Protocol   RN may order General Admission PRN Orders utilizing General Admission PRN medications (through manage orders) for the following patient needs: allergy symptoms (Claritin), cold sores (Carmex), cough (Robitussin DM), eye irritation (Liquifilm Tears), hemorrhoids (Tucks), indigestion (Maalox), minor skin irritation (Hydrocortisone  Cream), muscle pain (Ben Gay), nose irritation (saline nasal spray) and sore throat (Chloraseptic spray).   Cardiac Monitoring - Continuous Indefinite   Full code   Consult to hospitalist   vancomycin  per pharmacy consult   piperacillin-tazobactam (ZOSYN) per pharmacy consult   Pharmacy Consult   Pulse oximetry, continuous   Pulse oximetry check with vital signs   Pulse oximetry (single)   I-Stat CG4 Lactic Acid   ED EKG   Admit to Inpatient (patient's expected length of stay will be greater than 2 midnights or inpatient only procedure)   Skin care precautions   Aspiration precautions   Fall precautions    Author: Mario LULLA Blanch, MD 12 pm- 8 pm. [facility]. 11/21/2023 3:48 PM Please note for any communication after hours contact [facility] Assigned provider on call on Amion.

## 2023-11-21 NOTE — Progress Notes (Signed)
 ED Pharmacy Antibiotic Sign Off An antibiotic consult was received from an ED provider for vancomycin  and zosyn per pharmacy dosing for sepsis. A chart review was completed to assess appropriateness.   The following one time order(s) were placed:  Zosyn 3.375g Vancomycin  1750mg    Further antibiotic and/or antibiotic pharmacy consults should be ordered by the admitting provider if indicated.   Thank you for allowing pharmacy to be a part of this patient's care.   Leonor GORMAN Bash, [facility]  Clinical Pharmacist 11/21/23 10:39 AM

## 2023-11-21 NOTE — Hospital Course (Signed)
       Vitals overview shows patient is tachycardic dyspneic soft blood pressures O2 sats going down to 93%,

## 2023-11-22 DIAGNOSIS — I5030 Unspecified diastolic (congestive) heart failure: Secondary | ICD-10-CM

## 2023-11-22 DIAGNOSIS — R55 Syncope and collapse: Secondary | ICD-10-CM | POA: Diagnosis not present

## 2023-11-22 DIAGNOSIS — I4891 Unspecified atrial fibrillation: Secondary | ICD-10-CM

## 2023-11-22 DIAGNOSIS — W19XXXA Unspecified fall, initial encounter: Secondary | ICD-10-CM

## 2023-11-22 DIAGNOSIS — Y92009 Unspecified place in unspecified non-institutional (private) residence as the place of occurrence of the external cause: Secondary | ICD-10-CM

## 2023-11-22 DIAGNOSIS — L03317 Cellulitis of buttock: Secondary | ICD-10-CM

## 2023-11-22 LAB — PROTIME-INR
INR: 1.6 — ABNORMAL HIGH (ref 0.8–1.2)
Prothrombin Time: 20 s — ABNORMAL HIGH (ref 11.4–15.2)

## 2023-11-22 LAB — COMPREHENSIVE METABOLIC PANEL WITH GFR
ALT: 17 U/L (ref 0–44)
AST: 18 U/L (ref 15–41)
Albumin: 2.4 g/dL — ABNORMAL LOW (ref 3.5–5.0)
Alkaline Phosphatase: 103 U/L (ref 38–126)
Anion gap: 12 (ref 5–15)
BUN: 24 mg/dL — ABNORMAL HIGH (ref 8–23)
CO2: 18 mmol/L — ABNORMAL LOW (ref 22–32)
Calcium: 9 mg/dL (ref 8.9–10.3)
Chloride: 108 mmol/L (ref 98–111)
Creatinine, Ser: 1.15 mg/dL — ABNORMAL HIGH (ref 0.44–1.00)
GFR, Estimated: 51 mL/min — ABNORMAL LOW (ref >60)
Glucose, Bld: 146 mg/dL — ABNORMAL HIGH (ref 70–99)
Potassium: 4.2 mmol/L (ref 3.5–5.1)
Sodium: 138 mmol/L (ref 135–145)
Total Bilirubin: 0.7 mg/dL (ref 0.0–1.2)
Total Protein: 5.4 g/dL — ABNORMAL LOW (ref 6.5–8.1)

## 2023-11-22 LAB — CORTISOL-AM, BLOOD: Cortisol - AM: 24.3 ug/dL — ABNORMAL HIGH (ref 6.7–22.6)

## 2023-11-22 LAB — VITAMIN B12: Vitamin B-12: 518 pg/mL (ref 180–914)

## 2023-11-22 LAB — CBC
HCT: 35 % — ABNORMAL LOW (ref 36.0–46.0)
Hemoglobin: 11.1 g/dL — ABNORMAL LOW (ref 12.0–15.0)
MCH: 28.9 pg (ref 26.0–34.0)
MCHC: 31.7 g/dL (ref 30.0–36.0)
MCV: 91.1 fL (ref 80.0–100.0)
Platelets: 227 K/uL (ref 150–400)
RBC: 3.84 MIL/uL — ABNORMAL LOW (ref 3.87–5.11)
RDW: 16.5 % — ABNORMAL HIGH (ref 11.5–15.5)
WBC: 13.3 K/uL — ABNORMAL HIGH (ref 4.0–10.5)
nRBC: 0 % (ref 0.0–0.2)

## 2023-11-22 MED ORDER — ALBUMIN HUMAN 25 % IV SOLN
50.0000 g | Freq: Three times a day (TID) | INTRAVENOUS | Status: AC
  Administered 2023-11-22 – 2023-11-23 (×3): 50 g via INTRAVENOUS
  Filled 2023-11-22 (×3): qty 200

## 2023-11-22 MED ORDER — METOPROLOL SUCCINATE ER 50 MG PO TB24
50.0000 mg | ORAL_TABLET | Freq: Every day | ORAL | Status: DC
  Administered 2023-11-22 – 2023-11-27 (×6): 50 mg via ORAL
  Filled 2023-11-22 (×6): qty 1

## 2023-11-22 MED ORDER — SODIUM CHLORIDE 0.9 % IV SOLN
1.0000 g | INTRAVENOUS | Status: DC
  Administered 2023-11-22: 1 g via INTRAVENOUS
  Filled 2023-11-22: qty 10

## 2023-11-22 MED ORDER — MELATONIN 5 MG PO TABS: 5.0000 mg | ORAL_TABLET | Freq: Every evening | ORAL | Status: DC | PRN

## 2023-11-22 MED ORDER — ALBUMIN HUMAN 5 % IV SOLN
25.0000 g | Freq: Four times a day (QID) | INTRAVENOUS | Status: DC
  Administered 2023-11-22: 25 g via INTRAVENOUS
  Filled 2023-11-22 (×3): qty 500

## 2023-11-22 MED ORDER — GERHARDT'S BUTT CREAM
TOPICAL_CREAM | Freq: Two times a day (BID) | CUTANEOUS | Status: DC
  Administered 2023-11-23: 1 via TOPICAL
  Filled 2023-11-22 (×6): qty 60

## 2023-11-22 NOTE — TOC Progression Note (Signed)
 Transition of Care [facility]) - Progression Note    Patient Details  Name: Christine Jacobson MRN: [id number] Date of Birth: 1955-07-06  [facility] CM/SW Contact  Isaiah Public, LCSWA Phone Number: 11/22/2023, 2:43 PM  Clinical Narrative:      CSW received consult for possible SNF placement at time of discharge. CSW spoke with patient regarding PT recommendation of SNF placement at time of discharge. Patient reports PTA she comes from home alone. Patient expressed understanding of PT recommendation and is agreeable to SNF placement at time of discharge. Patient gave CSW permission to fax out initial referral for SNF placement. CSW discussed insurance authorization process and will provide Medicare SNF ratings list with accepted SNF bed offers when available. All questions answered. No further questions reported at this time. CSW to continue to follow and assist with discharge planning needs.                    Expected Discharge Plan and Services                                               Social Drivers of Health (SDOH) Interventions SDOH Screenings   Food Insecurity: No Food Insecurity (11/21/2023)  Housing: Low Risk  (11/21/2023)  Transportation Needs: No Transportation Needs (11/21/2023)  Utilities: Not At Risk (11/21/2023)  Depression (PHQ2-9): Low Risk  (10/26/2021)  Financial Resource Strain: Low Risk  (12/03/2021)  Social Connections: Moderately Isolated (11/21/2023)  Tobacco Use: Medium Risk (11/21/2023)    Readmission Risk Interventions    11/16/2023   10:02 AM 05/12/2023    4:15 PM 12/28/2022   11:28 AM  Readmission Risk Prevention Plan  Transportation Screening Complete Complete Complete  PCP or Specialist Appt within 3-5 Days Complete  Complete  HRI or Home Care Consult Complete  Complete  Social Work Consult for Recovery Care Planning/Counseling Complete  Complete  Palliative Care Screening Not Applicable  Not Applicable   Medication Review Oceanographer)  Complete Complete  HRI or Home Care Consult  Complete   Palliative Care Screening  Not Applicable   Skilled Nursing Facility  Not Applicable

## 2023-11-22 NOTE — Progress Notes (Signed)
 [facility] PROGRESS NOTE    Progress Note  Christine Jacobson  FMW:[id number] DOB: 12/24/1955 DOA: 11/21/2023 PCP: [name], NP     Brief Narrative:   Christine Jacobson is an 68 y.o. female past medical history of hypertrophic obstructive cardiomyopathy, HFpEF, essential hypertension recently discharged from the hospital for severe sepsis due to vulvar cellulitis and yeast infection along with A-fib with RVR during that time she was found to have an ovarian mass comes in for syncopal episode.  Assessment/Plan:   Sepsis left leg cellulitis and buttock area: Start empirically on IV antibiotics. Fluid Resuscitated his blood pressure improved. Blood cultures have been sent. De-escalate antibiotics to IV vancomycin  and Rocephin . Consult wound care. Anticipate she will need skilled nursing facility. Consult physical therapy. She needs to be out of bed to chair, she cannot take care of herself at home.  Acute kidney injury: Likely due to sepsis improving with IV fluids continue IV fluids for an additional 24 hours. With a baseline creatinine of around 1 on admission 1.3. Her urine still appears significantly concentrated. Recheck basic metabolic panel in the morning.  A-fib with RVR: Was continued on Eliquis  and low-dose metoprolol . Now rate controlled.  Syncope and collapse Secondary to hypotension in the setting with A-fib with RVR possible shock. Was started on IV fluids blood pressure is improved rate has improved  Chronic diastolic dysfunction: Will be judicious with IV fluids. Continue strict I's and O's and daily weights. She is only positive about 40 cc but she is wet in the bed. Agree with IV albumin   Bilateral ovarian cystic mass Need to follow-up with GYN as an outpatient.    DVT prophylaxis: Eliquis  Family Communication:none Status is: Inpatient Remains inpatient appropriate because: Sepsis    Code Status:     Code Status Orders   (From admission, onward)           Start     Ordered   11/21/23 1509  Full code  Continuous       Question:  By:  Answer:  Other   11/21/23 1509           Code Status History     Date Active Date Inactive Code Status Order ID Comments User Context   11/13/2023 1602 11/18/2023 1738 Full Code [id number]  Georgina Basket, MD ED   10/11/2023 1412 10/15/2023 1705 Full Code [id number]  Zella Katha HERO, MD ED   05/11/2023 0051 05/13/2023 1704 Full Code [id number]  Debby Camila LABOR, MD ED   12/27/2022 2021 12/31/2022 2329 Full Code [id number]  Kathrin Mignon DASEN, MD ED   09/29/2022 1809 09/30/2022 2129 Full Code [id number]  Ruthell Lonni FALCON, PA-C ED   09/15/2022 1704 09/21/2022 1746 Limited: Do not attempt resuscitation (DNR) -DNR-LIMITED -Do Not Intubate/DNI  [id number]  Stoney Blizzard, DO ED   08/04/2022 1638 08/14/2022 1927 DNR [id number]  Zella Katha HERO, MD ED   07/13/2022 1635 07/21/2022 1958 DNR [id number]  Celinda Alm Lot, MD Inpatient   07/13/2022 1534 07/13/2022 1635 Full Code [id number]  Celinda Alm Lot, MD ED   06/02/2022 1711 06/09/2022 1845 DNR [id number]  Sebastian Toribio GAILS, MD ED   10/13/2021 1632 10/15/2021 1929 Full Code [id number]  Barbarann Nest, MD ED         IV Access:   Peripheral IV   Procedures and diagnostic studies:   CT ABDOMEN PELVIS W CONTRAST Result Date: 11/21/2023 EXAM: CT ABDOMEN AND PELVIS WITH CONTRAST 11/21/2023 02:33:50 PM TECHNIQUE: CT of the abdomen  and pelvis was performed with the administration of 75 mL of iohexol  (OMNIPAQUE ) 350 MG/ML injection. Multiplanar reformatted images are provided for review. Automated exposure control, iterative reconstruction, and/or weight-based adjustment of the mA/kV was utilized to reduce the radiation dose to as low as reasonably achievable. COMPARISON: CT 11/13/2023. CLINICAL HISTORY: Sepsis. FINDINGS: LOWER CHEST: No acute abnormality. LIVER: The liver is unremarkable. GALLBLADDER AND BILE DUCTS: Gallbladder is  unremarkable. No biliary ductal dilatation. SPLEEN: No acute abnormality. PANCREAS: No acute abnormality. ADRENAL GLANDS: No acute abnormality. KIDNEYS, URETERS AND BLADDER: No stones in the kidneys or ureters. No hydronephrosis. No perinephric or periureteral stranding. Urinary bladder is unremarkable. GI AND BOWEL: Stomach demonstrates no acute abnormality. There is no bowel obstruction. Normal appendix. PERITONEUM AND RETROPERITONEUM: No ascites. No free air. VASCULATURE: Aorta is normal in caliber. LYMPH NODES: No lymphadenopathy. REPRODUCTIVE ORGANS: Large cystic lesions of the ovaries are redemonstrated. The cystic lesion in the right ovary measures 11.8 cm and in the left ovary measures 9.0 cm. These are not substantially changed from 11/13/2023. BONES AND SOFT TISSUES: No acute osseous abnormality. No focal soft tissue abnormality. IMPRESSION: 1. No acute abnormality in the abdomen or pelvis. 2. Large bilateral ovarian cystic lesions, right 11.8 cm and left 9.0 cm, not substantially changed from 11/13/23. Findings are concerning for low-grade cystic neoplasm; recommend gynecology consultation if not already obtained. Consider nonemergent pelvic MR with IV contrast if clinically appropriate. Electronically signed by: Norman Gatlin MD 11/21/2023 03:01 PM EST RP Workstation: [id number]   DG Chest Portable 1 View Result Date: 11/21/2023 EXAM: 1 VIEW(S) XRAY OF THE CHEST 11/21/2023 10:34:00 AM COMPARISON: None available. CLINICAL HISTORY: sepsis, cough? FINDINGS: LUNGS AND PLEURA: No focal pulmonary opacity. No pulmonary edema. No pleural effusion. No pneumothorax. HEART AND MEDIASTINUM: Mild cardiomegaly. BONES AND SOFT TISSUES: No acute osseous abnormality. IMPRESSION: 1. No acute cardiopulmonary process. Electronically signed by: Norleen Boxer MD 11/21/2023 11:45 AM EST RP Workstation: [id number]     Medical Consultants:   None.   Subjective:    Christine Jacobson relates she feels better than  yesterday but still tired weak and fatigued  Objective:    Vitals:   11/21/23 1947 11/21/23 2243 11/21/23 2352 11/22/23 0459  BP: 115/75  101/70 102/72  Pulse: (!) 102  95 86  Resp: 17  18 17   Temp: 98.3 F (36.8 C) 98.3 F (36.8 C) 98.6 F (37 C) 98 F (36.7 C)  TempSrc: Oral  Oral Oral  SpO2:   94% 96%  Weight:  83.3 kg    Height:  5' 4 (1.626 m)     SpO2: 96 %   Intake/Output Summary (Last 24 hours) at 11/22/2023 0647 Last data filed at 11/22/2023 0500 Gross per 24 hour  Intake 240 ml  Output 200 ml  Net 40 ml   Filed Weights   11/21/23 2243  Weight: 83.3 kg    Exam: General exam: In no acute distress. Respiratory system: Good air movement and clear to auscultation. Cardiovascular system: S1 & S2 heard, RRR. No JVD.  Gastrointestinal system: Abdomen is nondistended, soft and nontender.  Extremities: 3+ edema Skin:     Psychiatry: Judgement and insight appear normal. Mood & affect appropriate.    Data Reviewed:    Labs: Basic Metabolic Panel: Recent Labs  Lab 11/16/23 0309 11/21/23 0925 11/21/23 1720 11/22/23 0501  NA 138 137  --  138  K 4.1 4.4  --  4.2  CL 108 106  --  108  CO2 20*  14*  --  18*  GLUCOSE 106* 97  --  146*  BUN 19 23  --  24*  CREATININE 1.31* 1.33*  --  1.15*  CALCIUM  8.7* 9.2  --  9.0  MG 1.9  --  1.8  --   PHOS 3.1  --   --   --    GFR Estimated Creatinine Clearance: 47.5 mL/min (A) (by C-G formula based on SCr of 1.15 mg/dL (H)). Liver Function Tests: Recent Labs  Lab 11/16/23 0309 11/21/23 0925 11/22/23 0501  AST  --  30 18  ALT  --  25 17  ALKPHOS  --  120 103  BILITOT  --  1.0 0.7  PROT  --  5.9* 5.4*  ALBUMIN  2.4* 2.6* 2.4*   No results for input(s): LIPASE, AMYLASE in the last 168 hours. No results for input(s): AMMONIA in the last 168 hours. Coagulation profile Recent Labs  Lab 11/21/23 1747 11/22/23 0501  INR 1.4* 1.6*   COVID-19 Labs  No results for input(s): DDIMER, FERRITIN,  LDH, CRP in the last 72 hours.  Lab Results  Component Value Date   SARSCOV2NAA NEGATIVE 11/21/2023   SARSCOV2NAA NEGATIVE 11/13/2023   SARSCOV2NAA NEGATIVE 02/14/2023    CBC: Recent Labs  Lab 11/16/23 0309 11/21/23 0925 11/22/23 0501  WBC 6.7 16.7* 13.3*  NEUTROABS  --  15.9*  --   HGB 10.5* 12.8 11.1*  HCT 34.3* 40.4 35.0*  MCV 94.8 91.0 91.1  PLT 214 203 227   Cardiac Enzymes: Recent Labs  Lab 11/21/23 1215  CKTOTAL 38   BNP (last 3 results) Recent Labs    10/11/23 0935 10/12/23 0458  PROBNP 4,647.0* 4,823.0*   CBG: No results for input(s): GLUCAP in the last 168 hours. D-Dimer: No results for input(s): DDIMER in the last 72 hours. Hgb A1c: No results for input(s): HGBA1C in the last 72 hours. Lipid Profile: No results for input(s): CHOL, HDL, LDLCALC, TRIG, CHOLHDL, LDLDIRECT in the last 72 hours. Thyroid function studies: No results for input(s): TSH, T4TOTAL, T3FREE, THYROIDAB in the last 72 hours.  Invalid input(s): FREET3 Anemia work up: Recent Labs    11/22/23 0501  VITAMINB12 518   Sepsis Labs: Recent Labs  Lab 11/16/23 0309 11/21/23 0925 11/21/23 0949 11/21/23 1225 11/21/23 1726 11/22/23 0501  WBC 6.7 16.7*  --   --   --  13.3*  LATICACIDVEN  --   --  4.4* 2.6* 1.8  --    Microbiology Recent Results (from the past 240 hours)  Blood Culture (routine x 2)     Status: None   Collection Time: 11/13/23  8:38 AM   Specimen: BLOOD LEFT ARM  Result Value Ref Range Status   Specimen Description BLOOD LEFT ARM  Final   Special Requests   Final    BOTTLES DRAWN AEROBIC AND ANAEROBIC Blood Culture results may not be optimal due to an inadequate volume of blood received in culture bottles   Culture   Final    NO GROWTH 5 DAYS Performed at [facility] Lab, [address] [place], [place] [address]    Report Status 11/18/2023 FINAL  Final  Blood Culture (routine x 2)     Status: None   Collection Time:  11/13/23  9:14 AM   Specimen: BLOOD RIGHT HAND  Result Value Ref Range Status   Specimen Description BLOOD RIGHT HAND  Final   Special Requests   Final    BOTTLES DRAWN AEROBIC AND ANAEROBIC Blood  Culture results may not be optimal due to an inadequate volume of blood received in culture bottles   Culture   Final    NO GROWTH 5 DAYS Performed at [facility], [address], [place], [place] [address]    Report Status 11/18/2023 FINAL  Final  Resp panel by RT-PCR (RSV, Flu A&B, Covid) Anterior Nasal Swab     Status: None   Collection Time: 11/13/23  9:18 AM   Specimen: Anterior Nasal Swab  Result Value Ref Range Status   SARS Coronavirus 2 by RT PCR NEGATIVE NEGATIVE Final   Influenza A by PCR NEGATIVE NEGATIVE Final   Influenza B by PCR NEGATIVE NEGATIVE Final    Comment: (NOTE) The Xpert Xpress SARS-CoV-2/FLU/RSV plus assay is intended as an aid in the diagnosis of influenza from Nasopharyngeal swab specimens and should not be used as a sole basis for treatment. Nasal washings and aspirates are unacceptable for Xpert Xpress SARS-CoV-2/FLU/RSV testing.  Fact Sheet for Patients: bloggercourse.com  Fact Sheet for Healthcare Providers: seriousbroker.it  This test is not yet approved or cleared by the [place]  FDA and has been authorized for detection and/or diagnosis of SARS-CoV-2 by FDA under an Emergency Use Authorization (EUA). This EUA will remain in effect (meaning this test can be used) for the duration of the COVID-19 declaration under Section 564(b)(1) of the Act, 21 U.S.C. section 360bbb-3(b)(1), unless the authorization is terminated or revoked.     Resp Syncytial Virus by PCR NEGATIVE NEGATIVE Final    Comment: (NOTE) Fact Sheet for Patients: bloggercourse.com  Fact Sheet for Healthcare Providers: seriousbroker.it  This test is not yet approved or  cleared by the [place]  FDA and has been authorized for detection and/or diagnosis of SARS-CoV-2 by FDA under an Emergency Use Authorization (EUA). This EUA will remain in effect (meaning this test can be used) for the duration of the COVID-19 declaration under Section 564(b)(1) of the Act, 21 U.S.C. section 360bbb-3(b)(1), unless the authorization is terminated or revoked.  Performed at [facility] Lab, [address] [place], [place] [address]   MRSA Next Gen by PCR, Nasal     Status: None   Collection Time: 11/14/23  3:42 PM   Specimen: Nasal Mucosa; Nasal Swab  Result Value Ref Range Status   MRSA by PCR Next Gen NOT DETECTED NOT DETECTED Final    Comment: (NOTE) The GeneXpert MRSA Assay (FDA approved for NASAL specimens only), is one component of a comprehensive MRSA colonization surveillance program. It is not intended to diagnose MRSA infection nor to guide or monitor treatment for MRSA infections. Test performance is not FDA approved in patients less than 33 years old. Performed at [facility], [address], [place], [place] [address]   Blood culture (routine x 2)     Status: None (Preliminary result)   Collection Time: 11/21/23  9:25 AM   Specimen: BLOOD  Result Value Ref Range Status   Specimen Description BLOOD SITE NOT SPECIFIED  Final   Special Requests   Final    BOTTLES DRAWN AEROBIC ONLY Blood Culture results may not be optimal due to an inadequate volume of blood received in culture bottles   Culture   Final    NO GROWTH < 24 HOURS Performed at [facility] Lab, [address], [place], [place] [address]    Report Status PENDING  Incomplete  Resp panel by RT-PCR (RSV, Flu A&B, Covid) Anterior Nasal Swab  Status: None   Collection Time: 11/21/23 10:18 AM   Specimen: Anterior Nasal Swab  Result Value Ref Range Status   SARS Coronavirus 2 by RT PCR NEGATIVE NEGATIVE Final   Influenza A by PCR NEGATIVE NEGATIVE Final   Influenza B by PCR  NEGATIVE NEGATIVE Final    Comment: (NOTE) The Xpert Xpress SARS-CoV-2/FLU/RSV plus assay is intended as an aid in the diagnosis of influenza from Nasopharyngeal swab specimens and should not be used as a sole basis for treatment. Nasal washings and aspirates are unacceptable for Xpert Xpress SARS-CoV-2/FLU/RSV testing.  Fact Sheet for Patients: bloggercourse.com  Fact Sheet for Healthcare Providers: seriousbroker.it  This test is not yet approved or cleared by the [place]  FDA and has been authorized for detection and/or diagnosis of SARS-CoV-2 by FDA under an Emergency Use Authorization (EUA). This EUA will remain in effect (meaning this test can be used) for the duration of the COVID-19 declaration under Section 564(b)(1) of the Act, 21 U.S.C. section 360bbb-3(b)(1), unless the authorization is terminated or revoked.     Resp Syncytial Virus by PCR NEGATIVE NEGATIVE Final    Comment: (NOTE) Fact Sheet for Patients: bloggercourse.com  Fact Sheet for Healthcare Providers: seriousbroker.it  This test is not yet approved or cleared by the [place]  FDA and has been authorized for detection and/or diagnosis of SARS-CoV-2 by FDA under an Emergency Use Authorization (EUA). This EUA will remain in effect (meaning this test can be used) for the duration of the COVID-19 declaration under Section 564(b)(1) of the Act, 21 U.S.C. section 360bbb-3(b)(1), unless the authorization is terminated or revoked.  Performed at [facility], [address] [place], [place] [address]   Blood culture (routine x 2)     Status: None (Preliminary result)   Collection Time: 11/21/23  5:20 PM   Specimen: BLOOD RIGHT ARM  Result Value Ref Range Status   Specimen Description BLOOD RIGHT ARM  Final   Special Requests   Final    BOTTLES DRAWN AEROBIC AND ANAEROBIC Blood Culture results  may not be optimal due to an inadequate volume of blood received in culture bottles   Culture   Final    NO GROWTH < 12 HOURS Performed at [facility], [address], [place], [place] [address]    Report Status PENDING  Incomplete     Medications:    apixaban   5 mg Oral BID   atorvastatin   80 mg Oral Daily   ezetimibe   10 mg Oral Daily   fluconazole  100 mg Oral Daily   metoprolol  tartrate  12.5 mg Oral BID   thiamine (VITAMIN B1) injection  100 mg Intravenous Daily   Continuous Infusions:  sodium chloride  50 mL/hr at 11/21/23 1701   piperacillin-tazobactam (ZOSYN)  IV 3.375 g (11/22/23 0519)   vancomycin         LOS: 1 day   Christine Jacobson  [facility]  11/22/2023, 6:47 AM

## 2023-11-22 NOTE — Plan of Care (Signed)

## 2023-11-22 NOTE — Evaluation (Signed)
 Physical Therapy Evaluation  Patient Details Name: Christine Jacobson MRN: [id number] DOB: Aug 24, 1955 Today's Date: 11/22/2023  History of Present Illness  Christine Jacobson is a 68 y.o. female presenting for syncope and fall. Pt with a-fib RVR.  Pt with noted extensive sever cellutlits on both thighs posteriorly and bilat LEs. PMH: HOCM, HFpEF, HTN and HLD, pt with 2 recent admissions since 10/11/2023 with severe sepsis secondary to yeast infection and vulvar cellulitis, found to have an ovarian mass.   Clinical Impression  Pt admitted with above. Pt presenting with high anxiety, 8/10 pain t/o back side, requires maxA for bed mobility and minAx2 to stand and side step to [facility]. Pt with frequent hospital admissions within the last month and noted decline in function and ability to care for self each admission. Pt to benefit from inpatient rehab program < 3 hrs a day to allow for increased time to heal and achieve safe mod I level of function for safe transition home alone. Acute PT to cont to follow.      If plan is discharge home, recommend the following: A lot of help with walking and/or transfers;A lot of help with bathing/dressing/bathroom;Assist for transportation;Help with stairs or ramp for entrance;Supervision due to cognitive status   Can travel by private vehicle   No    Equipment Recommendations Rolling walker (2 wheels) (TBD next venue)  Recommendations for Other Services       Functional Status Assessment Patient has had a recent decline in their functional status and/or demonstrates limited ability to make significant improvements in function in a reasonable and predictable amount of time     Precautions / Restrictions Precautions Precautions: Fall Restrictions Weight Bearing Restrictions Per Provider Order: No      Mobility  Bed Mobility Overal bed mobility: Needs Assistance Bed Mobility: Rolling, Sidelying to Sit, Sit to Sidelying Rolling: Max assist, Used  rails Sidelying to sit: Max assist, +2 for physical assistance, Used rails     Sit to sidelying: Max assist, +2 for physical assistance, Used rails General bed mobility comments: pt very anxious, limited active LE movement in bed due to pain, used bed pad to help pt roll to R sidelying, RN assisted with LE on/off EOB, PT assisted with trunk elevation and return to sidelying    Transfers Overall transfer level: Needs assistance Equipment used: 2 person hand held assist (face to face transfer) Transfers: Sit to/from Stand Sit to Stand: Min assist, +2 physical assistance           General transfer comment: attempted to stand up to RW however pt very anxious and wouldn't attempt, once RN came in room pt felt comfortable standing with 2 person HHA and was able to side step to [facility]    Ambulation/Gait               General Gait Details: limited to side stepping to [facility], about 8 short, shuffled steps  Stairs            Wheelchair Mobility     Tilt Bed    Modified Rankin (Stroke Patients Only)       Balance Overall balance assessment: Needs assistance Sitting-balance support: Feet supported, Bilateral upper extremity supported Sitting balance-Leahy Scale: Fair     Standing balance support: Bilateral upper extremity supported, During functional activity, Reliant on assistive device for balance Standing balance-Leahy Scale: Poor Standing balance comment: reliant on RW or external support  Pertinent Vitals/Pain Pain Assessment Pain Assessment: Faces Faces Pain Scale: Hurts whole lot Pain Location: back side with movement, c/o i'm sticking to the bed Pain Descriptors / Indicators: Tender Pain Intervention(s): Monitored during session    Home Living Family/patient expects to be discharged to:: Private residence Living Arrangements: Alone Available Help at Discharge: Friend(s);Available PRN/intermittently Type of Home:  Apartment Home Access: Stairs to enter Entrance Stairs-Rails: Left Entrance Stairs-Number of Steps: 6   Home Layout: One level Home Equipment: Cane - single point (reports [facility] lost her RW)      Prior Function Prior Level of Function : Independent/Modified Independent             Mobility Comments: uses [facility], doesn't drive, has groceries delivered ADLs Comments: bathes at sink, eats in room due to [employer]     Extremity/Trunk Assessment   Upper Extremity Assessment Upper Extremity Assessment: Generalized weakness    Lower Extremity Assessment Lower Extremity Assessment: Generalized weakness (noted discoloration)    Cervical / Trunk Assessment Cervical / Trunk Assessment: Other exceptions Cervical / Trunk Exceptions: skin maceration  Communication   Communication Communication: Impaired Factors Affecting Communication: Difficulty expressing self    Cognition Arousal: Alert Behavior During Therapy: Anxious   PT - Cognitive impairments: No family/caregiver present to determine baseline                       PT - Cognition Comments: pt very anxious regarding mobility, pt refusing to don socks, pt with noted poor hygiene Following commands: Impaired Following commands impaired: Follows one step commands with increased time     Cueing Cueing Techniques: Verbal cues     General Comments General comments (skin integrity, edema, etc.): skin maceration t/o buttocks and LEs, bilat foot discoloration    Exercises General Exercises - Lower Extremity Ankle Circles/Pumps: AROM, Both, 10 reps, Seated Long Arc Quad: AROM, Both, Seated, 10 reps Hip Flexion/Marching: AROM, Both, 10 reps, Seated   Assessment/Plan    PT Assessment Patient needs continued PT services  PT Problem List Decreased mobility       PT Treatment Interventions DME instruction;Balance training;Gait training;Stair training;Patient/family education;Functional mobility  training;Therapeutic activities;Therapeutic exercise    PT Goals (Current goals can be found in the Care Plan section)  Acute Rehab PT Goals Patient Stated Goal: did not state PT Goal Formulation: With patient Time For Goal Achievement: 12/06/23 Potential to Achieve Goals: Fair    Frequency Min 1X/week     Co-evaluation               AM-PAC PT 6 Clicks Mobility  Outcome Measure Help needed turning from your back to your side while in a flat bed without using bedrails?: A Lot Help needed moving from lying on your back to sitting on the side of a flat bed without using bedrails?: A Lot Help needed moving to and from a bed to a chair (including a wheelchair)?: A Lot Help needed standing up from a chair using your arms (e.g., wheelchair or bedside chair)?: A Lot Help needed to walk in hospital room?: Total Help needed climbing 3-5 steps with a railing? : Total 6 Click Score: 10    End of Session Equipment Utilized During Treatment: Gait belt Activity Tolerance: Other (comment) (pt self limiting and anxious, did better when RN, Carlin, present) Patient left: in bed;with call bell/phone within reach;with bed alarm set;with nursing/sitter in room Nurse Communication: Mobility status;Other (comment) PT Visit Diagnosis: Other abnormalities of gait  and mobility (R26.89)    Time: 8843-8771 PT Time Calculation (min) (ACUTE ONLY): 32 min   Charges:   PT Evaluation $PT Eval Low Complexity: 1 Low PT Treatments $Therapeutic Activity: 8-22 mins PT General Charges $$ ACUTE PT VISIT: 1 Visit         Norene Ames, PT, DPT Acute Rehabilitation Services Secure chat preferred Office #: [phone]   Norene CHRISTELLA Ames 11/22/2023, 1:27 PM

## 2023-11-22 NOTE — Consult Note (Addendum)
 WOC Nurse Consult Note:  Consult requested for BLE, sacrum and buttocks, and inner groin. Performed remotely after review of photos and progress notes in the EMR.  Pt is familiar to [facility] team from a recent admission and has refused wrapping to legs in past   Wound type: Bilat legs with cellulitis and generalized edema and erythemia related to cellulitis and dry scabby patches of skin; weeping mod amt yellow fluid. Full thickness chronic wound to right posterior leg, red and moist        Red moist macerated skin with patchy areas of partial thickness skin loss; appearance consistent with Intertriginous dermatitis  and moisture associated skin damage with probable with fungal component. Pt is on systemic antibiotics and antifungal medication.  Affected areas are bilat buttocks and sacrum, inner groin, and posterior upper thighs.     These are NOT pressure injuries. Dressing procedure/placement/frequency: Topical treatment orders provided for bedside nurses to perform as follows:  Cleanse right leg wound with NS, apply Xeroform gauze ([place] [phone]) to wound beds daily, cover with ABD pads and  and secure with Kerlix roll gauze preferably wrapped from right above toes to right below knee. Apply Ace bandage wrapped in same fashion for light compression as patient will allow.   Gerhardts cream to bilat buttocks/sacrum/posterior thighs/perineum and cover with antifungal powder BID and with each turning and cleaning session.   Please re-consult if further assistance is needed.  Thank-you,  Stephane Fought MSN, RN, CWOCN, CWCN-AP, CNS Contact Mon-Fri 0700-1500: [phone]

## 2023-11-22 NOTE — NC FL2 (Signed)
 [place]  MEDICAID FL2 LEVEL OF CARE FORM     IDENTIFICATION  Patient Name: Christine Jacobson Birthdate: Aug 24, 1955 Sex: female Admission Date (Current Location): 11/21/2023  [facility] and [place] Number:  Producer, Television/film/video and Address:  [facility], [address], [place], [place] [address]      Provider Number: [id number]  Attending Physician Name and Address:  Odell Celinda Balo, MD  Relative Name and Phone Number:       Current Level of Care: Hospital Recommended Level of Care: Skilled Nursing Facility Prior Approval Number:    Date Approved/Denied:   PASRR Number: [id number]  Discharge Plan: SNF    Current Diagnoses: Patient Active Problem List   Diagnosis Date Noted   (HFpEF) heart failure with preserved ejection fraction (HCC) 11/22/2023   Syncope and collapse 11/21/2023   Vulvar cellulitis 11/13/2023   Ovarian mass 11/13/2023   Ovarian mass, left 11/13/2023   Sepsis (HCC) 11/13/2023   Coronary artery calcification seen on CAT scan 10/13/2023   Elevated troponin 10/12/2023   Abnormal stress test 10/12/2023   Acute diastolic (congestive) heart failure (HCC) 10/11/2023   Persistent atrial fibrillation (HCC) 12/28/2022   Acute on chronic diastolic CHF (congestive heart failure) (HCC) 12/27/2022   Major depressive disorder, 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   MDD (major depressive disorder) 09/29/2022   Anxiety 09/16/2022   Acute exacerbation of CHF (congestive heart failure) (HCC) 09/15/2022   Atrial fibrillation with RVR (HCC) 09/15/2022   Leg swelling 09/15/2022   GIB (gastrointestinal bleeding) 08/05/2022   Acquired thrombophilia 08/05/2022   Chronic anticoagulation 08/05/2022   Acute blood loss anemia 08/04/2022   Left leg cellulitis 07/13/2022   Hypocarbia 07/13/2022   Normocytic anemia 07/13/2022   Cellulitis 06/02/2022   Sepsis due to cellulitis (HCC) 06/02/2022   AKI (acute kidney injury)  06/02/2022   Hyponatremia 06/02/2022   Dehydration 06/02/2022   Metabolic acidosis 06/02/2022   Pulmonary HTN (HCC)    Cellulitis and abscess of left lower extremity 10/13/2021   PAF (paroxysmal atrial fibrillation) (HCC) 07/28/2021   Obesity (BMI 30-39.9)    Liver function test abnormality    Primary hypertension    Chronic diastolic heart failure (HCC) 12/15/2016   HOCM (hypertrophic obstructive cardiomyopathy) (HCC) 01/22/2016   Lower extremity edema 09/17/2014   History of syncope 12/05/2013   Syncope 12/05/2013   Pneumonia 11/23/2012   Shortness of breath 07/11/2011   Hypertension 11/23/2010   Chest pain of uncertain etiology 10/18/2010   Murmur 10/18/2010   Palpitations 09/23/2010   Mild hyperlipidemia    Elevated BP    Hyperprolactinemia (HCC)    Pyoderma gangrenosa (HCC)     Orientation RESPIRATION BLADDER Height & Weight     Self, Time, Situation, Place  Normal Incontinent, External catheter (External Urinary Catheter) Weight: 183 lb 10.3 oz (83.3 kg) Height:  5' 4 (162.6 cm)  BEHAVIORAL SYMPTOMS/MOOD NEUROLOGICAL BOWEL NUTRITION STATUS      Continent Diet (Please see discharge summary)  AMBULATORY STATUS COMMUNICATION OF NEEDS Skin   Extensive Assist Verbally Other (Comment) (Erythema,Buttocks,Groin,Leg,Bil.,Weeping,leg,Bil.,Gauze,Wound/Incision LDAs,wound,vascular,ulcer,pretibial,R,Wound,irritant,contact,dermatitis,buttocks,R,L,Mid,Wound/Irritant,contact,dermatitis,Groin,Mid,R,L)                       Personal Care Assistance Level of Assistance  Bathing, Dressing, Feeding Bathing Assistance: Maximum assistance Feeding assistance: Independent Dressing Assistance: Maximum assistance     Functional Limitations Info  Sight, Hearing, Speech Sight Info:  (eyeglasses) Hearing Info: Adequate Speech Info: Adequate    SPECIAL CARE FACTORS  FREQUENCY  PT (By licensed PT), OT (By licensed OT)     PT Frequency: 5x min weekly OT Frequency: 5x min weekly             Contractures Contractures Info: Not present    Additional Factors Info  Code Status, Allergies Code Status Info: FULL Allergies Info: Porcine (pork) Protein-containing Drug Product           Current Medications (11/22/2023):  This is the current hospital active medication list Current Facility-Administered Medications  Medication Dose Route Frequency Provider Last Rate Last Admin   acetaminophen  (TYLENOL ) tablet 650 mg  650 mg Oral Q6H PRN Patel, Ekta V, MD       Or   acetaminophen  (TYLENOL ) suppository 650 mg  650 mg Rectal Q6H PRN Patel, Ekta V, MD       albumin  human 25 % solution 50 g  50 g Intravenous Q8H Odell Celinda Balo, MD       apixaban  (ELIQUIS ) tablet 5 mg  5 mg Oral BID Patel, Ekta V, MD   5 mg at 11/22/23 0847   atorvastatin  (LIPITOR) tablet 80 mg  80 mg Oral Daily Patel, Ekta V, MD   80 mg at 11/22/23 0847   bisacodyl  (DULCOLAX) EC tablet 5 mg  5 mg Oral Daily PRN Patel, Ekta V, MD       cefTRIAXone  (ROCEPHIN ) 1 g in sodium chloride  0.9 % 100 mL IVPB  1 g Intravenous Q24H Odell Celinda Balo, MD 200 mL/hr at 11/22/23 1407 1 g at 11/22/23 1407   ezetimibe  (ZETIA ) tablet 10 mg  10 mg Oral Daily Patel, Ekta V, MD   10 mg at 11/22/23 0847   fluconazole (DIFLUCAN) tablet 100 mg  100 mg Oral Daily Patel, Ekta V, MD   100 mg at 11/22/23 9152   Gerhardt's butt cream   Topical BID Odell Celinda Balo, MD   Given at 11/22/23 1045   metoprolol  succinate (TOPROL -XL) 24 hr tablet 50 mg  50 mg Oral Daily Odell Celinda Balo, MD   50 mg at 11/22/23 0847   ondansetron  (ZOFRAN ) tablet 4 mg  4 mg Oral Q6H PRN Patel, Ekta V, MD       Or   ondansetron  (ZOFRAN ) injection 4 mg  4 mg Intravenous Q6H PRN Patel, Ekta V, MD       Oral care mouth rinse  15 mL Mouth Rinse PRN Tobie Mario GAILS, MD       thiamine (VITAMIN B1) injection 100 mg  100 mg Intravenous Daily Patel, Ekta V, MD   100 mg at 11/22/23 0848   vancomycin  (VANCOREADY) IVPB 750 mg/150 mL  750 mg Intravenous Q24H  Denninger, Jade M, RPH 150 mL/hr at 11/22/23 1231 750 mg at 11/22/23 1231     Discharge Medications: Please see discharge summary for a list of discharge medications.  Relevant Imaging Results:  Relevant Lab Results:   Additional Information SSN-[id number]  Isaiah Public, LCSWA

## 2023-11-23 DIAGNOSIS — I4891 Unspecified atrial fibrillation: Secondary | ICD-10-CM | POA: Diagnosis not present

## 2023-11-23 DIAGNOSIS — W19XXXA Unspecified fall, initial encounter: Secondary | ICD-10-CM | POA: Diagnosis not present

## 2023-11-23 DIAGNOSIS — L03317 Cellulitis of buttock: Secondary | ICD-10-CM | POA: Diagnosis not present

## 2023-11-23 DIAGNOSIS — R55 Syncope and collapse: Secondary | ICD-10-CM | POA: Diagnosis not present

## 2023-11-23 LAB — CBC
HCT: 29.8 % — ABNORMAL LOW (ref 36.0–46.0)
Hemoglobin: 9.4 g/dL — ABNORMAL LOW (ref 12.0–15.0)
MCH: 28.8 pg (ref 26.0–34.0)
MCHC: 31.5 g/dL (ref 30.0–36.0)
MCV: 91.4 fL (ref 80.0–100.0)
Platelets: 189 K/uL (ref 150–400)
RBC: 3.26 MIL/uL — ABNORMAL LOW (ref 3.87–5.11)
RDW: 16.5 % — ABNORMAL HIGH (ref 11.5–15.5)
WBC: 6.4 K/uL (ref 4.0–10.5)
nRBC: 0.3 % — ABNORMAL HIGH (ref 0.0–0.2)

## 2023-11-23 LAB — BASIC METABOLIC PANEL WITH GFR
Anion gap: 10 (ref 5–15)
BUN: 22 mg/dL (ref 8–23)
CO2: 21 mmol/L — ABNORMAL LOW (ref 22–32)
Calcium: 8.9 mg/dL (ref 8.9–10.3)
Chloride: 109 mmol/L (ref 98–111)
Creatinine, Ser: 1 mg/dL (ref 0.44–1.00)
GFR, Estimated: >60 mL/min (ref >60)
Glucose, Bld: 98 mg/dL (ref 70–99)
Potassium: 3.8 mmol/L (ref 3.5–5.1)
Sodium: 140 mmol/L (ref 135–145)

## 2023-11-23 MED ORDER — SODIUM CHLORIDE 0.9 % IV SOLN
1.0000 g | INTRAVENOUS | Status: DC
  Administered 2023-11-23: 1 g via INTRAVENOUS
  Filled 2023-11-23: qty 10

## 2023-11-23 MED ORDER — LINEZOLID 600 MG PO TABS
600.0000 mg | ORAL_TABLET | Freq: Two times a day (BID) | ORAL | Status: DC
  Administered 2023-11-23 – 2023-11-27 (×9): 600 mg via ORAL
  Filled 2023-11-23 (×10): qty 1

## 2023-11-23 MED ORDER — SODIUM CHLORIDE 0.9 % IV SOLN: 2.0000 g | INTRAVENOUS | Status: DC

## 2023-11-23 NOTE — Progress Notes (Signed)
 Patient agreeable to leg dressings. Order for wound care clarified by [facility], RN. Dressing applied to right lower extremity. No weeping noted, no open wound noted on left leg, no dressing applied per Stephane Fought, RN.

## 2023-11-23 NOTE — Progress Notes (Signed)
 PHARMACY NOTE:  ANTIMICROBIAL RENAL DOSAGE ADJUSTMENT  Current antimicrobial regimen includes a mismatch between antimicrobial dosage and estimated renal function.  As per policy approved by the Pharmacy & Therapeutics and Medical Executive Committees, the antimicrobial dosage will be adjusted accordingly.  Current antimicrobial dosage:  ceftriaxone  1g q24h  Indication: buttocks cellulitis / sepsis  Renal Function:  Estimated Creatinine Clearance: 54.6 mL/min (by C-G formula based on SCr of 1 mg/dL). []      On intermittent HD, scheduled: []      On CRRT    Antimicrobial dosage has been changed to:  ceftriaxone  2g q24h  Additional comments:   Thank you for allowing pharmacy to be a part of this patient's care.  Christine Jacobson, [facility] 11/23/2023 8:03 AM

## 2023-11-23 NOTE — TOC Progression Note (Signed)
 Transition of Care [facility]) - Progression Note    Patient Details  Name: Christine Jacobson MRN: [id number] Date of Birth: July 15, 1955  [facility] of [facility]) CM/SW Contact  Isaiah Public, LCSWA Phone Number: 11/23/2023, 1:36 PM  Clinical Narrative:     CSW spoke with patient at bedside and provided Medicare compare ratings list of accepted SNF bed offers. Patient request for CSW to come back,patient is going to review SNF bed offers. CSW will follow back up with patient on SNF choice. CSW will continue to follow and assist with patients dc planning needs.                    Expected Discharge Plan and Services                                               Social Drivers of Health (SDOH) Interventions SDOH Screenings   Food Insecurity: No Food Insecurity (11/21/2023)  Housing: Low Risk  (11/21/2023)  Transportation Needs: No Transportation Needs (11/21/2023)  Utilities: Not At Risk (11/21/2023)  Depression (PHQ2-9): Low Risk  (10/26/2021)  Financial Resource Strain: Low Risk  (12/03/2021)  Social Connections: Moderately Isolated (11/21/2023)  Tobacco Use: Medium Risk (11/21/2023)    Readmission Risk Interventions    11/16/2023   10:02 AM 05/12/2023    4:15 PM 12/28/2022   11:28 AM  Readmission Risk Prevention Plan  Transportation Screening Complete Complete Complete  PCP or Specialist Appt within 3-5 Days Complete  Complete  HRI or Home Care Consult Complete  Complete  Social Work Consult for Recovery Care Planning/Counseling Complete  Complete  Palliative Care Screening Not Applicable  Not Applicable  Medication Review Oceanographer)  Complete Complete  HRI or Home Care Consult  Complete   Palliative Care Screening  Not Applicable   Skilled Nursing Facility  Not Applicable

## 2023-11-23 NOTE — Progress Notes (Signed)
 [facility] PROGRESS NOTE    Progress Note  Christine Jacobson  FMW:[id number] DOB: 1956/01/09 DOA: 11/21/2023 PCP: [name], NP     Brief Narrative:   Christine Jacobson is an 68 y.o. female past medical history of hypertrophic obstructive cardiomyopathy, HFpEF, essential hypertension recently discharged from the hospital for severe sepsis due to vulvar cellulitis and yeast infection along with A-fib with RVR during that time she was found to have an ovarian mass comes in for syncopal episode.  Assessment/Plan:   Sepsis left leg cellulitis and buttock area: Continue empirically on IV antibiotics. Blood cultures have been negative till date. Continue IV vancomycin  and Rocephin . Consult wound care. Anticipate she will need skilled nursing facility. Consult physical therapy. She needs to be out of bed to chair, she cannot take care of herself at home. TOC consulted for skilled nursing facility placement.  Acute kidney injury: Likely due to sepsis improving with IV fluids continue IV fluids for an additional 24 hours. With a baseline creatinine of around 1 on admission 1.3. KVO IV fluids creatinine has returned to baseline  A-fib with RVR: Was continued on Eliquis  and low-dose metoprolol . Now rate controlled.  Syncope and collapse Secondary to hypotension in the setting with A-fib with RVR. KVO IV fluids.  Chronic diastolic dysfunction: KVO IV fluids Continue strict I's and O's and daily weights.  Bilateral ovarian cystic mass Need to follow-up with GYN as an outpatient.    DVT prophylaxis: Eliquis  Family Communication:none Status is: Inpatient Remains inpatient appropriate because: Sepsis    Code Status:     Code Status Orders  (From admission, onward)           Start     Ordered   11/21/23 1509  Full code  Continuous       Question:  By:  Answer:  Other   11/21/23 1509           Code Status History     Date Active Date  Inactive Code Status Order ID Comments User Context   11/13/2023 1602 11/18/2023 1738 Full Code [id number]  Georgina Basket, MD ED   10/11/2023 1412 10/15/2023 1705 Full Code [id number]  Zella Katha HERO, MD ED   05/11/2023 0051 05/13/2023 1704 Full Code [id number]  Debby Camila LABOR, MD ED   12/27/2022 2021 12/31/2022 2329 Full Code [id number]  Kathrin Mignon DASEN, MD ED   09/29/2022 1809 09/30/2022 2129 Full Code [id number]  Ruthell Lonni FALCON, PA-C ED   09/15/2022 1704 09/21/2022 1746 Limited: Do not attempt resuscitation (DNR) -DNR-LIMITED -Do Not Intubate/DNI  [id number]  Stoney Blizzard, DO ED   08/04/2022 1638 08/14/2022 1927 DNR [id number]  Zella Katha HERO, MD ED   07/13/2022 1635 07/21/2022 1958 DNR [id number]  Celinda Alm Lot, MD Inpatient   07/13/2022 1534 07/13/2022 1635 Full Code [id number]  Celinda Alm Lot, MD ED   06/02/2022 1711 06/09/2022 1845 DNR [id number]  Sebastian Toribio GAILS, MD ED   10/13/2021 1632 10/15/2021 1929 Full Code [id number]  Barbarann Nest, MD ED         IV Access:   Peripheral IV   Procedures and diagnostic studies:   CT ABDOMEN PELVIS W CONTRAST Result Date: 11/21/2023 EXAM: CT ABDOMEN AND PELVIS WITH CONTRAST 11/21/2023 02:33:50 PM TECHNIQUE: CT of the abdomen and pelvis was performed with the administration of 75 mL of iohexol  (OMNIPAQUE ) 350 MG/ML injection. Multiplanar reformatted images are provided for review. Automated exposure control, iterative reconstruction, and/or weight-based adjustment of the mA/kV was utilized  to reduce the radiation dose to as low as reasonably achievable. COMPARISON: CT 11/13/2023. CLINICAL HISTORY: Sepsis. FINDINGS: LOWER CHEST: No acute abnormality. LIVER: The liver is unremarkable. GALLBLADDER AND BILE DUCTS: Gallbladder is unremarkable. No biliary ductal dilatation. SPLEEN: No acute abnormality. PANCREAS: No acute abnormality. ADRENAL GLANDS: No acute abnormality. KIDNEYS, URETERS AND BLADDER: No stones in the kidneys or ureters. No  hydronephrosis. No perinephric or periureteral stranding. Urinary bladder is unremarkable. GI AND BOWEL: Stomach demonstrates no acute abnormality. There is no bowel obstruction. Normal appendix. PERITONEUM AND RETROPERITONEUM: No ascites. No free air. VASCULATURE: Aorta is normal in caliber. LYMPH NODES: No lymphadenopathy. REPRODUCTIVE ORGANS: Large cystic lesions of the ovaries are redemonstrated. The cystic lesion in the right ovary measures 11.8 cm and in the left ovary measures 9.0 cm. These are not substantially changed from 11/13/2023. BONES AND SOFT TISSUES: No acute osseous abnormality. No focal soft tissue abnormality. IMPRESSION: 1. No acute abnormality in the abdomen or pelvis. 2. Large bilateral ovarian cystic lesions, right 11.8 cm and left 9.0 cm, not substantially changed from 11/13/23. Findings are concerning for low-grade cystic neoplasm; recommend gynecology consultation if not already obtained. Consider nonemergent pelvic MR with IV contrast if clinically appropriate. Electronically signed by: Norman Gatlin MD 11/21/2023 03:01 PM EST RP Workstation: [id number]   DG Chest Portable 1 View Result Date: 11/21/2023 EXAM: 1 VIEW(S) XRAY OF THE CHEST 11/21/2023 10:34:00 AM COMPARISON: None available. CLINICAL HISTORY: sepsis, cough? FINDINGS: LUNGS AND PLEURA: No focal pulmonary opacity. No pulmonary edema. No pleural effusion. No pneumothorax. HEART AND MEDIASTINUM: Mild cardiomegaly. BONES AND SOFT TISSUES: No acute osseous abnormality. IMPRESSION: 1. No acute cardiopulmonary process. Electronically signed by: Norleen Boxer MD 11/21/2023 11:45 AM EST RP Workstation: [id number]     Medical Consultants:   None.   Subjective:    Sarh Beth Johannsen relates she feels better, overwhelmed by everything that is going on  Objective:    Vitals:   11/22/23 2044 11/22/23 2315 11/23/23 0353 11/23/23 0737  BP: 124/85 134/89 (!) 141/102 136/76  Pulse: 86 88 78 80  Resp: 20 19 18    Temp:  98.6 F (37 C) 98.3 F (36.8 C) 98.2 F (36.8 C) 97.9 F (36.6 C)  TempSrc: Oral Oral Oral Oral  SpO2:      Weight:      Height:       SpO2: 99 %   Intake/Output Summary (Last 24 hours) at 11/23/2023 0840 Last data filed at 11/23/2023 0600 Gross per 24 hour  Intake 1308.27 ml  Output 860 ml  Net 448.27 ml   Filed Weights   11/21/23 2243  Weight: 83.3 kg    Exam: General exam: In no acute distress. Respiratory system: Good air movement and clear to auscultation. Cardiovascular system: S1 & S2 heard, RRR. No JVD. Gastrointestinal system: Abdomen is nondistended, soft and nontender.  Extremities: No pedal edema. Skin:     Psychiatry: Judgement and insight appear normal. Mood & affect appropriate.  Data Reviewed:    Labs: Basic Metabolic Panel: Recent Labs  Lab 11/21/23 0925 11/21/23 1720 11/22/23 0501 11/23/23 0433  NA 137  --  138 140  K 4.4  --  4.2 3.8  CL 106  --  108 109  CO2 14*  --  18* 21*  GLUCOSE 97  --  146* 98  BUN 23  --  24* 22  CREATININE 1.33*  --  1.15* 1.00  CALCIUM  9.2  --  9.0 8.9  MG  --  1.8  --   --  GFR Estimated Creatinine Clearance: 54.6 mL/min (by C-G formula based on SCr of 1 mg/dL). Liver Function Tests: Recent Labs  Lab 11/21/23 0925 11/22/23 0501  AST 30 18  ALT 25 17  ALKPHOS 120 103  BILITOT 1.0 0.7  PROT 5.9* 5.4*  ALBUMIN  2.6* 2.4*   No results for input(s): LIPASE, AMYLASE in the last 168 hours. No results for input(s): AMMONIA in the last 168 hours. Coagulation profile Recent Labs  Lab 11/21/23 1747 11/22/23 0501  INR 1.4* 1.6*   COVID-19 Labs  No results for input(s): DDIMER, FERRITIN, LDH, CRP in the last 72 hours.  Lab Results  Component Value Date   SARSCOV2NAA NEGATIVE 11/21/2023   SARSCOV2NAA NEGATIVE 11/13/2023   SARSCOV2NAA NEGATIVE 02/14/2023    CBC: Recent Labs  Lab 11/21/23 0925 11/22/23 0501 11/23/23 0433  WBC 16.7* 13.3* 6.4  NEUTROABS 15.9*  --   --   HGB  12.8 11.1* 9.4*  HCT 40.4 35.0* 29.8*  MCV 91.0 91.1 91.4  PLT 203 227 189   Cardiac Enzymes: Recent Labs  Lab 11/21/23 1215  CKTOTAL 38   BNP (last 3 results) Recent Labs    10/11/23 0935 10/12/23 0458  PROBNP 4,647.0* 4,823.0*   CBG: No results for input(s): GLUCAP in the last 168 hours. D-Dimer: No results for input(s): DDIMER in the last 72 hours. Hgb A1c: No results for input(s): HGBA1C in the last 72 hours. Lipid Profile: No results for input(s): CHOL, HDL, LDLCALC, TRIG, CHOLHDL, LDLDIRECT in the last 72 hours. Thyroid function studies: No results for input(s): TSH, T4TOTAL, T3FREE, THYROIDAB in the last 72 hours.  Invalid input(s): FREET3 Anemia work up: Recent Labs    11/22/23 0501  VITAMINB12 518   Sepsis Labs: Recent Labs  Lab 11/21/23 0925 11/21/23 0949 11/21/23 1225 11/21/23 1726 11/22/23 0501 11/23/23 0433  WBC 16.7*  --   --   --  13.3* 6.4  LATICACIDVEN  --  4.4* 2.6* 1.8  --   --    Microbiology Recent Results (from the past 240 hours)  MRSA Next Gen by PCR, Nasal     Status: None   Collection Time: 11/14/23  3:42 PM   Specimen: Nasal Mucosa; Nasal Swab  Result Value Ref Range Status   MRSA by PCR Next Gen NOT DETECTED NOT DETECTED Final    Comment: (NOTE) The GeneXpert MRSA Assay (FDA approved for NASAL specimens only), is one component of a comprehensive MRSA colonization surveillance program. It is not intended to diagnose MRSA infection nor to guide or monitor treatment for MRSA infections. Test performance is not FDA approved in patients less than 59 years old. Performed at [facility], [address] [place], [place] [address]   Blood culture (routine x 2)     Status: None (Preliminary result)   Collection Time: 11/21/23  9:25 AM   Specimen: BLOOD  Result Value Ref Range Status   Specimen Description BLOOD SITE NOT SPECIFIED  Final   Special Requests   Final    BOTTLES DRAWN AEROBIC ONLY  Blood Culture results may not be optimal due to an inadequate volume of blood received in culture bottles   Culture   Final    NO GROWTH 2 DAYS Performed at [facility], [address] [place], [place] [address]    Report Status PENDING  Incomplete  Resp panel by RT-PCR (RSV, Flu A&B, Covid) Anterior Nasal Swab     Status: None   Collection Time: 11/21/23 10:18  AM   Specimen: Anterior Nasal Swab  Result Value Ref Range Status   SARS Coronavirus 2 by RT PCR NEGATIVE NEGATIVE Final   Influenza A by PCR NEGATIVE NEGATIVE Final   Influenza B by PCR NEGATIVE NEGATIVE Final    Comment: (NOTE) The Xpert Xpress SARS-CoV-2/FLU/RSV plus assay is intended as an aid in the diagnosis of influenza from Nasopharyngeal swab specimens and should not be used as a sole basis for treatment. Nasal washings and aspirates are unacceptable for Xpert Xpress SARS-CoV-2/FLU/RSV testing.  Fact Sheet for Patients: bloggercourse.com  Fact Sheet for Healthcare Providers: seriousbroker.it  This test is not yet approved or cleared by the [place]  FDA and has been authorized for detection and/or diagnosis of SARS-CoV-2 by FDA under an Emergency Use Authorization (EUA). This EUA will remain in effect (meaning this test can be used) for the duration of the COVID-19 declaration under Section 564(b)(1) of the Act, 21 U.S.C. section 360bbb-3(b)(1), unless the authorization is terminated or revoked.     Resp Syncytial Virus by PCR NEGATIVE NEGATIVE Final    Comment: (NOTE) Fact Sheet for Patients: bloggercourse.com  Fact Sheet for Healthcare Providers: seriousbroker.it  This test is not yet approved or cleared by the [place]  FDA and has been authorized for detection and/or diagnosis of SARS-CoV-2 by FDA under an Emergency Use Authorization (EUA). This EUA will remain in effect (meaning this test  can be used) for the duration of the COVID-19 declaration under Section 564(b)(1) of the Act, 21 U.S.C. section 360bbb-3(b)(1), unless the authorization is terminated or revoked.  Performed at [facility], [address], [place], [place] [address]   Blood culture (routine x 2)     Status: None (Preliminary result)   Collection Time: 11/21/23  5:20 PM   Specimen: BLOOD RIGHT ARM  Result Value Ref Range Status   Specimen Description BLOOD RIGHT ARM  Final   Special Requests   Final    BOTTLES DRAWN AEROBIC AND ANAEROBIC Blood Culture results may not be optimal due to an inadequate volume of blood received in culture bottles   Culture   Final    NO GROWTH 2 DAYS Performed at [facility], [address] [place], [place] [address]    Report Status PENDING  Incomplete     Medications:    apixaban   5 mg Oral BID   atorvastatin   80 mg Oral Daily   ezetimibe   10 mg Oral Daily   fluconazole  100 mg Oral Daily   Gerhardt's butt cream   Topical BID   metoprolol  succinate  50 mg Oral Daily   thiamine (VITAMIN B1) injection  100 mg Intravenous Daily   Continuous Infusions:  cefTRIAXone  (ROCEPHIN )  IV     vancomycin  750 mg (11/22/23 1231)      LOS: 2 days   Erle Odell Castor  [facility]  11/23/2023, 8:40 AM

## 2023-11-23 NOTE — Progress Notes (Signed)
 Mobility Specialist Progress Note;   11/23/23 1144  Mobility  Activity Pivoted/transferred from bed to chair  Level of Assistance Moderate assist, patient does 50-74%  Assistive Device Front wheel walker  Distance Ambulated (ft) 5 ft  Activity Response Tolerated well  Mobility Referral Yes  Mobility visit 1 Mobility  Mobility Specialist Start Time (ACUTE ONLY) 1144  Mobility Specialist Stop Time (ACUTE ONLY) 1203  Mobility Specialist Time Calculation (min) (ACUTE ONLY) 19 min   Pt agreeable to mobility. States pain is tolerable today. Required ModA for bed mobility, MinA+2 (for safety) to stand and pivot to chair. Upon standing noticed pt was soiled w/ BM requiring pericare and linen change. Able to tolerate standing ~61min. Pt in chair with all needs met, call bell in reach. RN notified.   Lauraine Erm Mobility Specialist Please contact via SecureChat or [employer] [phone]

## 2023-11-24 ENCOUNTER — Other Ambulatory Visit (HOSPITAL_BASED_OUTPATIENT_CLINIC_OR_DEPARTMENT_OTHER): Payer: Self-pay

## 2023-11-24 ENCOUNTER — Other Ambulatory Visit (HOSPITAL_COMMUNITY): Payer: Self-pay

## 2023-11-24 DIAGNOSIS — R55 Syncope and collapse: Secondary | ICD-10-CM | POA: Diagnosis not present

## 2023-11-24 DIAGNOSIS — I4891 Unspecified atrial fibrillation: Secondary | ICD-10-CM | POA: Diagnosis not present

## 2023-11-24 DIAGNOSIS — L03317 Cellulitis of buttock: Secondary | ICD-10-CM | POA: Diagnosis not present

## 2023-11-24 LAB — BASIC METABOLIC PANEL WITH GFR
Anion gap: 11 (ref 5–15)
BUN: 20 mg/dL (ref 8–23)
CO2: 19 mmol/L — ABNORMAL LOW (ref 22–32)
Calcium: 8.8 mg/dL — ABNORMAL LOW (ref 8.9–10.3)
Chloride: 109 mmol/L (ref 98–111)
Creatinine, Ser: 0.94 mg/dL (ref 0.44–1.00)
GFR, Estimated: >60 mL/min (ref >60)
Glucose, Bld: 117 mg/dL — ABNORMAL HIGH (ref 70–99)
Potassium: 4.2 mmol/L (ref 3.5–5.1)
Sodium: 139 mmol/L (ref 135–145)

## 2023-11-24 LAB — CBC
HCT: 31 % — ABNORMAL LOW (ref 36.0–46.0)
Hemoglobin: 9.8 g/dL — ABNORMAL LOW (ref 12.0–15.0)
MCH: 28.9 pg (ref 26.0–34.0)
MCHC: 31.6 g/dL (ref 30.0–36.0)
MCV: 91.4 fL (ref 80.0–100.0)
Platelets: 232 K/uL (ref 150–400)
RBC: 3.39 MIL/uL — ABNORMAL LOW (ref 3.87–5.11)
RDW: 16.5 % — ABNORMAL HIGH (ref 11.5–15.5)
WBC: 6.6 K/uL (ref 4.0–10.5)
nRBC: 0.5 % — ABNORMAL HIGH (ref 0.0–0.2)

## 2023-11-24 MED ORDER — AMOXICILLIN-POT CLAVULANATE 875-125 MG PO TABS
1.0000 | ORAL_TABLET | Freq: Two times a day (BID) | ORAL | Status: DC
  Administered 2023-11-24 (×2): 1 via ORAL
  Filled 2023-11-24 (×2): qty 1

## 2023-11-24 MED ORDER — LINEZOLID 600 MG PO TABS: 600.0000 mg | ORAL_TABLET | Freq: Two times a day (BID) | ORAL | Status: AC

## 2023-11-24 MED ORDER — AMOXICILLIN-POT CLAVULANATE 875-125 MG PO TABS: 1.0000 | ORAL_TABLET | Freq: Two times a day (BID) | ORAL | Status: AC

## 2023-11-24 MED ORDER — FLUCONAZOLE 100 MG PO TABS
100.0000 mg | ORAL_TABLET | Freq: Every day | ORAL | 0 refills | Status: AC
  Filled 2023-11-24: qty 14, 14d supply, fill #0

## 2023-11-24 NOTE — Plan of Care (Signed)

## 2023-11-24 NOTE — Progress Notes (Signed)
 Physical Therapy Treatment Patient Details Name: Christine Jacobson MRN: [id number] DOB: August 16, 1955 Today's Date: 11/24/2023   History of Present Illness Christine Jacobson is a 68 y.o. female presenting for syncope and fall. Pt with a-fib RVR.  Pt with noted extensive sever cellutlits on both thighs posteriorly and bilat LEs. PMH: HOCM, HFpEF, HTN and HLD, pt with 2 recent admissions since 10/11/2023 with severe sepsis secondary to yeast infection and vulvar cellulitis, found to have an ovarian mass.    PT Comments  Pt is progressing toward goals, but doesn't accept verbal/physical assist well and struggles through activities slowly and finally completing the basic task.     If plan is discharge home, recommend the following: A lot of help with walking and/or transfers   Can travel by private vehicle     No  Equipment Recommendations  Rolling walker (2 wheels)    Recommendations for Other Services       Precautions / Restrictions Precautions Precautions: Fall Recall of Precautions/Restrictions: Intact Restrictions Weight Bearing Restrictions Per Provider Order: No     Mobility  Bed Mobility Overal bed mobility: Needs Assistance Bed Mobility: Supine to Sit Rolling: Used rails (HOB up.  Pt CLEAR there is not to be help or comment during her mobility, so CGA for safety and pt laboriously moves, but shows that if time and a few compensations she can get to EOB.)         General bed mobility comments: cued and assisted appropriately when allowed    Transfers Overall transfer level: Needs assistance   Transfers: Sit to/from Stand Sit to Stand: Min assist           General transfer comment: held the RW tightly as pt using RW to pull then push up on, 1 hand appropriately on the bed.  Feet sliding, but pt refusing socks.    Ambulation/Gait   Gait Distance (Feet): 5 Feet (forward and back with RW  and CGA for whatever safety could be provided.) Assistive device:  Rolling walker (2 wheels) Gait Pattern/deviations: Decreased step length - right, Decreased step length - left, Decreased stride length       General Gait Details: short, tentative stepswith RW  Limited today to staying around the bed  during peri-care for copious loose stool   Stairs             Wheelchair Mobility     Tilt Bed    Modified Rankin (Stroke Patients Only)       Balance Overall balance assessment: Needs assistance Sitting-balance support: Feet supported, Bilateral upper extremity supported Sitting balance-Leahy Scale: Fair     Standing balance support: Bilateral upper extremity supported, During functional activity, Reliant on assistive device for balance Standing balance-Leahy Scale: Poor Standing balance comment: reliant on RW or external support                            Communication    Cognition Arousal: Alert Behavior During Therapy: Anxious   PT - Cognitive impairments: No family/caregiver present to determine baseline                       PT - Cognition Comments: pt very anxious regarding mobility, pt refusing to don socks, pt with noted poor hygiene Following commands: Impaired Following commands impaired: Follows one step commands with increased time    Cueing Cueing Techniques: Verbal cues  Exercises  General Comments General comments (skin integrity, edema, etc.): VSS during >5-6 min standing during peri-care.      Pertinent Vitals/Pain Pain Assessment Pain Assessment: Faces Faces Pain Scale: Hurts even more Pain Location: back side Pain Descriptors / Indicators: Tender    Home Living                          Prior Function            PT Goals (current goals can now be found in the care plan section) Acute Rehab PT Goals PT Goal Formulation: With patient Potential to Achieve Goals: Fair Progress towards PT goals: Progressing toward goals    Frequency    Min 1X/week       PT Plan      Co-evaluation              AM-PAC PT 6 Clicks Mobility   Outcome Measure  Help needed turning from your back to your side while in a flat bed without using bedrails?: A Lot Help needed moving from lying on your back to sitting on the side of a flat bed without using bedrails?: A Lot Help needed moving to and from a bed to a chair (including a wheelchair)?: A Lot Help needed standing up from a chair using your arms (e.g., wheelchair or bedside chair)?: A Lot Help needed to walk in hospital room?: A Little Help needed climbing 3-5 steps with a railing? : A Lot 6 Click Score: 13    End of Session     Patient left: with nursing/sitter in room;with call bell/phone within reach Nurse Communication: Mobility status PT Visit Diagnosis: Other abnormalities of gait and mobility (R26.89)     Time: 8788-8768 PT Time Calculation (min) (ACUTE ONLY): 20 min  Charges:    $Therapeutic Activity: 8-22 mins PT General Charges $$ ACUTE PT VISIT: 1 Visit                     11/24/2023  India HERO., PT Acute Rehabilitation Services [phone]  (office)   Vinie GAILS Salvador Coupe 11/24/2023, 1:14 PM

## 2023-11-24 NOTE — TOC Progression Note (Addendum)
 Transition of Care [facility]) - Progression Note    Patient Details  Name: Christine Jacobson MRN: [id number] Date of Birth: Oct 06, 1955  [facility] CM/SW Contact  Isaiah Public, LCSWA Phone Number: 11/24/2023, 10:12 AM  Clinical Narrative:      CSW spoke with patient at bedside. Patient accepted SNF bed offer with [facility] . Patient requested and gave CSW permission to reach out to financial counseling to screen her for Medicaid. CSW emailed Vina with Financial Counseling and requested to screen patient for Medicaid. All questions answered. No further questions reported at this time.Tanya with [facility] confirmed SNF bed for patient. CSW requested for Jon CMA to start insurance authorization for patient.    Update- Jon informed CSW that patient not managed by Home and community. Facility would start insurance authorization for SNF for patient. CSW spoke with [facility] and requested for facility to start auth.                 Expected Discharge Plan and Services         Expected Discharge Date: 11/24/23                                     Social Drivers of Health (SDOH) Interventions SDOH Screenings   Food Insecurity: No Food Insecurity (11/21/2023)  Housing: Low Risk  (11/21/2023)  Transportation Needs: No Transportation Needs (11/21/2023)  Utilities: Not At Risk (11/21/2023)  Depression (PHQ2-9): Low Risk  (10/26/2021)  Financial Resource Strain: Low Risk  (12/03/2021)  Social Connections: Moderately Isolated (11/21/2023)  Tobacco Use: Medium Risk (11/21/2023)    Readmission Risk Interventions    11/16/2023   10:02 AM 05/12/2023    4:15 PM 12/28/2022   11:28 AM  Readmission Risk Prevention Plan  Transportation Screening Complete Complete Complete  PCP or Specialist Appt within 3-5 Days Complete  Complete  HRI or Home Care Consult Complete  Complete  Social Work Consult for Recovery Care Planning/Counseling Complete  Complete  Palliative  Care Screening Not Applicable  Not Applicable  Medication Review [employer])  Complete Complete  HRI or Home Care Consult  Complete   Palliative Care Screening  Not Applicable   Skilled Nursing Facility  Not Applicable

## 2023-11-24 NOTE — Care Management Important Message (Signed)
 Important Message  Patient Details  Name: Christine Jacobson MRN: [id number] Date of Birth: Dec 28, 1955   Important Message Given:  Yes - Medicare IM     Vonzell Arrie Sharps 11/24/2023, 10:38 AM

## 2023-11-24 NOTE — Discharge Summary (Signed)
 Physician Discharge Summary  Christine Jacobson FMW:[id number] DOB: Apr 14, 1955 DOA: 11/21/2023  PCP: [name], NP  Admit date: 11/21/2023 Discharge date: 11/24/2023  Admitted From: Home Disposition:  SNF  Recommendations for Outpatient Follow-up:  Follow up with PCP in 1-2 weeks Please obtain BMP/CBC in one week   Home Health:No Equipment/Devices:None  Discharge Condition:Stable CODE STATUS:Full Diet recommendation: Heart Healthy  Brief/Interim Summary: 67 y.o. female past medical history of hypertrophic obstructive cardiomyopathy, HFpEF, essential hypertension recently discharged from the hospital for severe sepsis due to vulvar cellulitis and yeast infection along with A-fib with RVR during that time she was found to have an ovarian mass comes in for syncopal episode.    Discharge Diagnoses:  Principal Problem:   Syncope and collapse Active Problems:   Left leg cellulitis   Atrial fibrillation with RVR (HCC)   Ovarian mass   (HFpEF) heart failure with preserved ejection fraction (HCC)  Sepsis due to left lower extremity cellulitis and buttock area: She will start empirically on IV linezolid and Rocephin  she was also continued on IV Diflucan. Blood cultures were obtained which remain negative till date. Wound care was consulted and recommended daily dressing changes. I am not sure if she was taking her medications at home as she was just recently discharged she was restarted back on her regimen and her sepsis is resolved. She will continue the Nasalide and Augmentin  as an outpatient for 7 days. She also continue Diflucan.  PT evaluated the patient recommended skilled nursing facility. As the patient cannot take care of herself at home.  Acute kidney injury: Likely due to sepsis resolved with IV fluids.  A-fib with RVR: Was continue low-dose metoprolol  and Eliquis  no changes made to her medication.  Syncope and collapse:  likely due to hypotension in the  setting of sepsis and RVR  Chronic diastolic dysfunction: No change made to her medication.  Bilateral ovarian cystic mass: Follow-up with GYN as an outpatient. Discharge Instructions  Discharge Instructions     Diet - low sodium heart healthy   Complete by: As directed    Discharge wound care:   Complete by: As directed    Continue wound dressings per wound care instructions.   Increase activity slowly   Complete by: As directed       Allergies as of 11/24/2023       Reactions   Porcine (pork) Protein-containing Drug Products Other (See Comments)   Religious preference        Medication List     STOP taking these medications    ezetimibe  10 MG tablet Commonly known as: Zetia        TAKE these medications    acetaminophen  325 MG tablet Commonly known as: TYLENOL  Take 2 tablets (650 mg total) by mouth every 6 (six) hours as needed for mild pain (pain score 1-3) or fever (or Fever >/= 101).   amoxicillin -clavulanate 875-125 MG tablet Commonly known as: AUGMENTIN  Take 1 tablet by mouth every 12 (twelve) hours for 7 days.   apixaban  5 MG Tabs tablet Commonly known as: Eliquis  Take 1 tablet (5 mg total) by mouth 2 (two) times daily.   atorvastatin  80 MG tablet Commonly known as: LIPITOR Take 1 tablet (80 mg total) by mouth daily.   fluconazole 100 MG tablet Commonly known as: DIFLUCAN Take 1 tablet (100 mg total) by mouth daily for 7 days.   furosemide  40 MG tablet Commonly known as: Lasix  Take 1 tablet (40 mg total) by mouth in the  morning.   linezolid 600 MG tablet Commonly known as: ZYVOX Take 1 tablet (600 mg total) by mouth every 12 (twelve) hours for 7 days.   metoprolol  succinate 50 MG 24 hr tablet Commonly known as: TOPROL -XL Take 1 tablet (50 mg total) by mouth daily. Take with or immediately following a meal.   pantoprazole  40 MG tablet Commonly known as: PROTONIX  Take 1 tablet (40 mg total) by mouth daily.   Potassium Chloride  ER 20  MEQ Tbcr Take 1 tablet (20 mEq total) by mouth every other day.   Zinc  Sulfate 220 (50 Zn) MG Tabs Take 1 tablet (220 mg total) by mouth daily for 30 days               Discharge Care Instructions  (From admission, onward)           Start     Ordered   11/24/23 0000  Discharge wound care:       Comments: Continue wound dressings per wound care instructions.   11/24/23 0743            Allergies  Allergen Reactions   Porcine (Pork) Protein-Containing Drug Products Other (See Comments)    Religious preference    Consultations: None   Procedures/Studies: CT ABDOMEN PELVIS W CONTRAST Result Date: 11/21/2023 EXAM: CT ABDOMEN AND PELVIS WITH CONTRAST 11/21/2023 02:33:50 PM TECHNIQUE: CT of the abdomen and pelvis was performed with the administration of 75 mL of iohexol  (OMNIPAQUE ) 350 MG/ML injection. Multiplanar reformatted images are provided for review. Automated exposure control, iterative reconstruction, and/or weight-based adjustment of the mA/kV was utilized to reduce the radiation dose to as low as reasonably achievable. COMPARISON: CT 11/13/2023. CLINICAL HISTORY: Sepsis. FINDINGS: LOWER CHEST: No acute abnormality. LIVER: The liver is unremarkable. GALLBLADDER AND BILE DUCTS: Gallbladder is unremarkable. No biliary ductal dilatation. SPLEEN: No acute abnormality. PANCREAS: No acute abnormality. ADRENAL GLANDS: No acute abnormality. KIDNEYS, URETERS AND BLADDER: No stones in the kidneys or ureters. No hydronephrosis. No perinephric or periureteral stranding. Urinary bladder is unremarkable. GI AND BOWEL: Stomach demonstrates no acute abnormality. There is no bowel obstruction. Normal appendix. PERITONEUM AND RETROPERITONEUM: No ascites. No free air. VASCULATURE: Aorta is normal in caliber. LYMPH NODES: No lymphadenopathy. REPRODUCTIVE ORGANS: Large cystic lesions of the ovaries are redemonstrated. The cystic lesion in the right ovary measures 11.8 cm and in the left  ovary measures 9.0 cm. These are not substantially changed from 11/13/2023. BONES AND SOFT TISSUES: No acute osseous abnormality. No focal soft tissue abnormality. IMPRESSION: 1. No acute abnormality in the abdomen or pelvis. 2. Large bilateral ovarian cystic lesions, right 11.8 cm and left 9.0 cm, not substantially changed from 11/13/23. Findings are concerning for low-grade cystic neoplasm; recommend gynecology consultation if not already obtained. Consider nonemergent pelvic MR with IV contrast if clinically appropriate. Electronically signed by: Norman Gatlin MD 11/21/2023 03:01 PM EST RP Workstation: [id number]   DG Chest Portable 1 View Result Date: 11/21/2023 EXAM: 1 VIEW(S) XRAY OF THE CHEST 11/21/2023 10:34:00 AM COMPARISON: None available. CLINICAL HISTORY: sepsis, cough? FINDINGS: LUNGS AND PLEURA: No focal pulmonary opacity. No pulmonary edema. No pleural effusion. No pneumothorax. HEART AND MEDIASTINUM: Mild cardiomegaly. BONES AND SOFT TISSUES: No acute osseous abnormality. IMPRESSION: 1. No acute cardiopulmonary process. Electronically signed by: Norleen Boxer MD 11/21/2023 11:45 AM EST RP Workstation: [id number]   DG Tibia/Fibula Right Result Date: 11/13/2023 EXAM: _VIEWS_ VIEW(S) XRAY OF THE _LATERALITY_ TIBIA AND FIBULA 11/13/2023 02:35:48 PM COMPARISON: None available. CLINICAL HISTORY: wound FINDINGS: BONES  AND JOINTS: No acute fracture. No focal osseous lesion. No joint dislocation. SOFT TISSUES: Diffuse subcutaneous soft tissue edema. IMPRESSION: 1. Diffuse subcutaneous soft tissue edema. 2. No acute osseous findings. Electronically signed by: Waddell Calk MD 11/13/2023 03:37 PM EDT RP Workstation: [id number]   CT CHEST ABDOMEN PELVIS W CONTRAST Result Date: 11/13/2023 CLINICAL DATA:  Sepsis * Tracking Code: BO * EXAM: CT CHEST, ABDOMEN, AND PELVIS WITH CONTRAST TECHNIQUE: Multidetector CT imaging of the chest, abdomen and pelvis was performed following the standard protocol during  bolus administration of intravenous contrast. RADIATION DOSE REDUCTION: This exam was performed according to the departmental dose-optimization program which includes automated exposure control, adjustment of the mA and/or kV according to patient size and/or use of iterative reconstruction technique. CONTRAST:  75mL OMNIPAQUE  IOHEXOL  350 MG/ML SOLN COMPARISON:  CT chest angiogram, 05/10/2023 FINDINGS: CT CHEST FINDINGS Cardiovascular: Scattered aortic atherosclerosis. Cardiomegaly. Three-vessel coronary artery calcifications. No pericardial effusion. Mediastinum/Nodes: Unchanged prominent mediastinal lymph nodes, high right paratracheal node measuring 1.6 x 0.8 cm (series 3, image 13). Heterogeneous nodular thyroid and isthmus (series 3, image 12). Trachea, and esophagus demonstrate no significant findings. Lungs/Pleura: Diffuse interlobular septal thickening, mosaic attenuation of the airspaces, and diffuse bilateral bronchial wall thickening. Small benign calcified nodule of the medial right lung base requiring no further follow-up or characterization (series 5, image 110). No pleural effusion or pneumothorax. Musculoskeletal: No chest wall abnormality. No acute osseous findings. CT ABDOMEN PELVIS FINDINGS Hepatobiliary: No solid liver abnormality is seen. No gallstones, gallbladder wall thickening, or biliary dilatation. Pancreas: Unremarkable. No pancreatic ductal dilatation or surrounding inflammatory changes. Spleen: Normal in size without significant abnormality. Adrenals/Urinary Tract: Adrenal glands are unremarkable. Kidneys are normal, without renal calculi, solid lesion, or hydronephrosis. Bladder is unremarkable. Stomach/Bowel: Stomach is within normal limits. Appendix appears normal. No evidence of bowel wall thickening, distention, or inflammatory changes. Vascular/Lymphatic: Aortic atherosclerosis. No enlarged abdominal or pelvic lymph nodes. Reproductive: Large cystic lesions of the ovaries, on the  right, situated in the right lower quadrant measuring 12.2 x 8.1 x 8.4 cm (series 3, image 93) and arising from the left ovary situated in the posterior pelvis with a visible internal septation measuring 9.0 x 7.9 x 6.6 cm (series 3, image 100). Other: No abdominal wall hernia or abnormality. No ascites. Musculoskeletal: No acute osseous findings. IMPRESSION: 1. Diffuse interlobular septal thickening, mosaic attenuation of the airspaces, and diffuse bilateral bronchial wall thickening, consistent with pulmonary edema. 2. Cardiomegaly and coronary artery disease. 3. Large cystic lesions of the ovaries, on the right, situated in the right lower quadrant measuring 12.2 x 8.1 x 8.4 cm and arising from the left ovary situated in the posterior pelvis with a visible internal septation measuring 9.0 x 7.9 x 6.6 cm. 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 Recommend gynecologic referral and consideration of further evaluation with pelvic ultrasound and/or contrast enhanced pelvic MRI. 4. Unchanged prominent mediastinal lymph nodes, nonspecific and likely reactive. 5. Heterogeneous nodular thyroid and isthmus. Recommend dedicated thyroid ultrasound on a nonemergent, outpatient basis if and when clinically appropriate. Aortic Atherosclerosis (ICD10-I70.0). Electronically Signed   By: Marolyn JONETTA Jaksch M.D.   On: 11/13/2023 13:41   DG Chest Port 1 View Result Date: 11/13/2023 EXAM: 1 VIEW(S) XRAY OF THE CHEST 11/13/2023 08:47:00 AM COMPARISON: 10/11/2023 CLINICAL HISTORY: Reason for exam: questionable sepsis; SHOB/vaginal bleeding. Upon ems arrival pt tachypnea 22, Afib rvr 120-160. EMS reports edema throughout , weeping edema r leg. FINDINGS: LUNGS AND PLEURA: No focal pulmonary opacity. No pulmonary edema.  No pleural effusion. No pneumothorax. HEART AND MEDIASTINUM: Stable cardiomegaly. BONES AND SOFT TISSUES: No acute osseous abnormality. IMPRESSION: 1. Stable cardiomegaly. 2. No acute lung disease. Electronically  signed by: Norleen Kil MD 11/13/2023 09:18 AM EDT RP Workstation: [id number]   (Echo, Carotid, EGD, Colonoscopy, ERCP)    Subjective: No complaints.  Discharge Exam: Vitals:   11/24/23 0029 11/24/23 0350  BP: 127/81 (!) 146/81  Pulse: 70 66  Resp: 19 16  Temp: 98.3 F (36.8 C) 98.4 F (36.9 C)  SpO2: 94% 100%   Vitals:   11/23/23 1556 11/23/23 2056 11/24/23 0029 11/24/23 0350  BP: 122/87 124/76 127/81 (!) 146/81  Pulse: 71  70 66  Resp: 18 18 19 16   Temp: 98.3 F (36.8 C) 98 F (36.7 C) 98.3 F (36.8 C) 98.4 F (36.9 C)  TempSrc: Oral Oral Oral Oral  SpO2: 96% 97% 94% 100%  Weight:      Height:        General: Pt is alert, awake, not in acute distress Cardiovascular: RRR, S1/S2 +, no rubs, no gallops Respiratory: CTA bilaterally, no wheezing, no rhonchi Abdominal: Soft, NT, ND, bowel sounds + Extremities: no edema, no cyanosis    The results of significant diagnostics from this hospitalization (including imaging, microbiology, ancillary and laboratory) are listed below for reference.     Microbiology: Recent Results (from the past 240 hours)  MRSA Next Gen by PCR, Nasal     Status: None   Collection Time: 11/14/23  3:42 PM   Specimen: Nasal Mucosa; Nasal Swab  Result Value Ref Range Status   MRSA by PCR Next Gen NOT DETECTED NOT DETECTED Final    Comment: (NOTE) The GeneXpert MRSA Assay (FDA approved for NASAL specimens only), is one component of a comprehensive MRSA colonization surveillance program. It is not intended to diagnose MRSA infection nor to guide or monitor treatment for MRSA infections. Test performance is not FDA approved in patients less than 8 years old. Performed at [facility], [address], [place], [place] [address]   Blood culture (routine x 2)     Status: None (Preliminary result)   Collection Time: 11/21/23  9:25 AM   Specimen: BLOOD  Result Value Ref Range Status   Specimen Description BLOOD SITE NOT SPECIFIED  Final    Special Requests   Final    BOTTLES DRAWN AEROBIC ONLY Blood Culture results may not be optimal due to an inadequate volume of blood received in culture bottles   Culture   Final    NO GROWTH 3 DAYS Performed at [facility], [address], [place], [place] [address]    Report Status PENDING  Incomplete  Resp panel by RT-PCR (RSV, Flu A&B, Covid) Anterior Nasal Swab     Status: None   Collection Time: 11/21/23 10:18 AM   Specimen: Anterior Nasal Swab  Result Value Ref Range Status   SARS Coronavirus 2 by RT PCR NEGATIVE NEGATIVE Final   Influenza A by PCR NEGATIVE NEGATIVE Final   Influenza B by PCR NEGATIVE NEGATIVE Final    Comment: (NOTE) The Xpert Xpress SARS-CoV-2/FLU/RSV plus assay is intended as an aid in the diagnosis of influenza from Nasopharyngeal swab specimens and should not be used as a sole basis for treatment. Nasal washings and aspirates are unacceptable for Xpert Xpress SARS-CoV-2/FLU/RSV testing.  Fact Sheet for Patients: bloggercourse.com  Fact Sheet for Healthcare Providers: seriousbroker.it  This test is not yet approved or cleared by the [place]  [place] FDA and has been authorized for detection and/or diagnosis of SARS-CoV-2 by FDA under an Emergency Use Authorization (EUA). This EUA will remain in effect (meaning this test can be used) for the duration of the COVID-19 declaration under Section 564(b)(1) of the Act, 21 U.S.C. section 360bbb-3(b)(1), unless the authorization is terminated or revoked.     Resp Syncytial Virus by PCR NEGATIVE NEGATIVE Final    Comment: (NOTE) Fact Sheet for Patients: bloggercourse.com  Fact Sheet for Healthcare Providers: seriousbroker.it  This test is not yet approved or cleared by the [place]  FDA and has been authorized for detection and/or diagnosis of SARS-CoV-2 by FDA under an Emergency Use  Authorization (EUA). This EUA will remain in effect (meaning this test can be used) for the duration of the COVID-19 declaration under Section 564(b)(1) of the Act, 21 U.S.C. section 360bbb-3(b)(1), unless the authorization is terminated or revoked.  Performed at [facility], [address] [place], [place] [address]   Blood culture (routine x 2)     Status: None (Preliminary result)   Collection Time: 11/21/23  5:20 PM   Specimen: BLOOD RIGHT ARM  Result Value Ref Range Status   Specimen Description BLOOD RIGHT ARM  Final   Special Requests   Final    BOTTLES DRAWN AEROBIC AND ANAEROBIC Blood Culture results may not be optimal due to an inadequate volume of blood received in culture bottles   Culture   Final    NO GROWTH 3 DAYS Performed at [facility], [address], [place], [place] [address]    Report Status PENDING  Incomplete     Labs: BNP (last 3 results) Recent Labs    05/11/23 0306 11/13/23 0920 11/21/23 1215  BNP 551.0* 915.1* 741.8*   Basic Metabolic Panel: Recent Labs  Lab 11/21/23 0925 11/21/23 1720 11/22/23 0501 11/23/23 0433 11/24/23 0414  NA 137  --  138 140 139  K 4.4  --  4.2 3.8 4.2  CL 106  --  108 109 109  CO2 14*  --  18* 21* 19*  GLUCOSE 97  --  146* 98 117*  BUN 23  --  24* 22 20  CREATININE 1.33*  --  1.15* 1.00 0.94  CALCIUM  9.2  --  9.0 8.9 8.8*  MG  --  1.8  --   --   --    Liver Function Tests: Recent Labs  Lab 11/21/23 0925 11/22/23 0501  AST 30 18  ALT 25 17  ALKPHOS 120 103  BILITOT 1.0 0.7  PROT 5.9* 5.4*  ALBUMIN  2.6* 2.4*   No results for input(s): LIPASE, AMYLASE in the last 168 hours. No results for input(s): AMMONIA in the last 168 hours. CBC: Recent Labs  Lab 11/21/23 0925 11/22/23 0501 11/23/23 0433 11/24/23 0414  WBC 16.7* 13.3* 6.4 6.6  NEUTROABS 15.9*  --   --   --   HGB 12.8 11.1* 9.4* 9.8*  HCT 40.4 35.0* 29.8* 31.0*  MCV 91.0 91.1 91.4 91.4  PLT 203 227 189 232   Cardiac  Enzymes: Recent Labs  Lab 11/21/23 1215  CKTOTAL 38   BNP: Invalid input(s): POCBNP CBG: No results for input(s): GLUCAP in the last 168 hours. D-Dimer No results for input(s): DDIMER in the last 72 hours. Hgb A1c No results for input(s): HGBA1C in the last 72 hours. Lipid Profile No results for input(s): CHOL, HDL, LDLCALC, TRIG, CHOLHDL, LDLDIRECT in the last 72 hours. Thyroid function studies No  results for input(s): TSH, T4TOTAL, T3FREE, THYROIDAB in the last 72 hours.  Invalid input(s): FREET3 Anemia work up Recent Labs    11/22/23 0501  VITAMINB12 518   Urinalysis    Component Value Date/Time   COLORURINE YELLOW 11/21/2023 1124   APPEARANCEUR CLEAR 11/21/2023 1124   LABSPEC >1.030 (H) 11/21/2023 1124   PHURINE 6.0 11/21/2023 1124   GLUCOSEU NEGATIVE 11/21/2023 1124   HGBUR TRACE (A) 11/21/2023 1124   BILIRUBINUR MODERATE (A) 11/21/2023 1124   KETONESUR NEGATIVE 11/21/2023 1124   PROTEINUR 30 (A) 11/21/2023 1124   NITRITE NEGATIVE 11/21/2023 1124   LEUKOCYTESUR NEGATIVE 11/21/2023 1124   Sepsis Labs Recent Labs  Lab 11/21/23 0925 11/22/23 0501 11/23/23 0433 11/24/23 0414  WBC 16.7* 13.3* 6.4 6.6   Microbiology Recent Results (from the past 240 hours)  MRSA Next Gen by PCR, Nasal     Status: None   Collection Time: 11/14/23  3:42 PM   Specimen: Nasal Mucosa; Nasal Swab  Result Value Ref Range Status   MRSA by PCR Next Gen NOT DETECTED NOT DETECTED Final    Comment: (NOTE) The GeneXpert MRSA Assay (FDA approved for NASAL specimens only), is one component of a comprehensive MRSA colonization surveillance program. It is not intended to diagnose MRSA infection nor to guide or monitor treatment for MRSA infections. Test performance is not FDA approved in patients less than 61 years old. Performed at [facility], [address] [place], [place] [address]   Blood culture (routine x 2)     Status: None (Preliminary  result)   Collection Time: 11/21/23  9:25 AM   Specimen: BLOOD  Result Value Ref Range Status   Specimen Description BLOOD SITE NOT SPECIFIED  Final   Special Requests   Final    BOTTLES DRAWN AEROBIC ONLY Blood Culture results may not be optimal due to an inadequate volume of blood received in culture bottles   Culture   Final    NO GROWTH 3 DAYS Performed at [facility], [address] [place], [place] [address]    Report Status PENDING  Incomplete  Resp panel by RT-PCR (RSV, Flu A&B, Covid) Anterior Nasal Swab     Status: None   Collection Time: 11/21/23 10:18 AM   Specimen: Anterior Nasal Swab  Result Value Ref Range Status   SARS Coronavirus 2 by RT PCR NEGATIVE NEGATIVE Final   Influenza A by PCR NEGATIVE NEGATIVE Final   Influenza B by PCR NEGATIVE NEGATIVE Final    Comment: (NOTE) The Xpert Xpress SARS-CoV-2/FLU/RSV plus assay is intended as an aid in the diagnosis of influenza from Nasopharyngeal swab specimens and should not be used as a sole basis for treatment. Nasal washings and aspirates are unacceptable for Xpert Xpress SARS-CoV-2/FLU/RSV testing.  Fact Sheet for Patients: bloggercourse.com  Fact Sheet for Healthcare Providers: seriousbroker.it  This test is not yet approved or cleared by the [place]  FDA and has been authorized for detection and/or diagnosis of SARS-CoV-2 by FDA under an Emergency Use Authorization (EUA). This EUA will remain in effect (meaning this test can be used) for the duration of the COVID-19 declaration under Section 564(b)(1) of the Act, 21 U.S.C. section 360bbb-3(b)(1), unless the authorization is terminated or revoked.     Resp Syncytial Virus by PCR NEGATIVE NEGATIVE Final    Comment: (NOTE) Fact Sheet for Patients: bloggercourse.com  Fact Sheet for Healthcare Providers: seriousbroker.it  This test is not yet  approved or cleared by the [place]  [place] FDA and has been authorized for detection and/or diagnosis of SARS-CoV-2 by FDA under an Emergency Use Authorization (EUA). This EUA will remain in effect (meaning this test can be used) for the duration of the COVID-19 declaration under Section 564(b)(1) of the Act, 21 U.S.C. section 360bbb-3(b)(1), unless the authorization is terminated or revoked.  Performed at [facility], [address] [place], [place] [address]   Blood culture (routine x 2)     Status: None (Preliminary result)   Collection Time: 11/21/23  5:20 PM   Specimen: BLOOD RIGHT ARM  Result Value Ref Range Status   Specimen Description BLOOD RIGHT ARM  Final   Special Requests   Final    BOTTLES DRAWN AEROBIC AND ANAEROBIC Blood Culture results may not be optimal due to an inadequate volume of blood received in culture bottles   Culture   Final    NO GROWTH 3 DAYS Performed at [facility], [address], [place], [place] [address]    Report Status PENDING  Incomplete     Time coordinating discharge: Over 35 minutes  SIGNED:   Erle Odell Castor, MD  [facility] 11/24/2023, 7:44 AM Pager   If 7PM-7AM, please contact night-coverage [email or web address] Password [email or web address]

## 2023-11-25 DIAGNOSIS — R55 Syncope and collapse: Secondary | ICD-10-CM | POA: Diagnosis not present

## 2023-11-25 LAB — CBC
HCT: 33.9 % — ABNORMAL LOW (ref 36.0–46.0)
Hemoglobin: 10.6 g/dL — ABNORMAL LOW (ref 12.0–15.0)
MCH: 28.9 pg (ref 26.0–34.0)
MCHC: 31.3 g/dL (ref 30.0–36.0)
MCV: 92.4 fL (ref 80.0–100.0)
Platelets: 249 K/uL (ref 150–400)
RBC: 3.67 MIL/uL — ABNORMAL LOW (ref 3.87–5.11)
RDW: 16.4 % — ABNORMAL HIGH (ref 11.5–15.5)
WBC: 11 K/uL — ABNORMAL HIGH (ref 4.0–10.5)
nRBC: 0.3 % — ABNORMAL HIGH (ref 0.0–0.2)

## 2023-11-25 LAB — BASIC METABOLIC PANEL WITH GFR
Anion gap: 11 (ref 5–15)
BUN: 16 mg/dL (ref 8–23)
CO2: 18 mmol/L — ABNORMAL LOW (ref 22–32)
Calcium: 9.1 mg/dL (ref 8.9–10.3)
Chloride: 110 mmol/L (ref 98–111)
Creatinine, Ser: 0.92 mg/dL (ref 0.44–1.00)
GFR, Estimated: >60 mL/min (ref >60)
Glucose, Bld: 112 mg/dL — ABNORMAL HIGH (ref 70–99)
Potassium: 4.1 mmol/L (ref 3.5–5.1)
Sodium: 139 mmol/L (ref 135–145)

## 2023-11-25 MED ORDER — THIAMINE MONONITRATE 100 MG PO TABS
100.0000 mg | ORAL_TABLET | Freq: Every day | ORAL | Status: DC
  Administered 2023-11-25 – 2023-11-26 (×2): 100 mg via ORAL
  Filled 2023-11-25 (×2): qty 1

## 2023-11-25 MED ORDER — SODIUM CHLORIDE 0.9 % IV SOLN
2.0000 g | INTRAVENOUS | Status: DC
  Administered 2023-11-25: 2 g via INTRAVENOUS
  Filled 2023-11-25: qty 20

## 2023-11-25 NOTE — Plan of Care (Signed)

## 2023-11-25 NOTE — TOC Progression Note (Signed)
 Transition of Care [facility]) - Progression Note    Patient Details  Name: Christine Jacobson MRN: [id number] Date of Birth: 1955/10/28  Transition of Care [facility]) CM/SW Contact  [address], [place], [place] Phone Number: 11/25/2023, 11:40 AM  Clinical Narrative:     Phone call to [facility] spoke with Glenys regarding status of SNF authorization. Per Glenys, authorization started but has not received yet. Per Glenys, may not come back until Monday.   CSW will continue to follow for status update on SNF authorization.  Kristiana Jacko, LCSW Transition of Care                     Expected Discharge Plan and Services         Expected Discharge Date: 11/24/23                                     Social Drivers of Health (SDOH) Interventions SDOH Screenings   Food Insecurity: No Food Insecurity (11/21/2023)  Housing: Low Risk  (11/21/2023)  Transportation Needs: No Transportation Needs (11/21/2023)  Utilities: Not At Risk (11/21/2023)  Depression (PHQ2-9): Low Risk  (10/26/2021)  Financial Resource Strain: Low Risk  (12/03/2021)  Social Connections: Moderately Isolated (11/21/2023)  Tobacco Use: Medium Risk (11/21/2023)    Readmission Risk Interventions    11/16/2023   10:02 AM 05/12/2023    4:15 PM 12/28/2022   11:28 AM  Readmission Risk Prevention Plan  Transportation Screening Complete Complete Complete  PCP or Specialist Appt within 3-5 Days Complete  Complete  HRI or Home Care Consult Complete  Complete  Social Work Consult for Recovery Care Planning/Counseling Complete  Complete  Palliative Care Screening Not Applicable  Not Applicable  Medication Review [employer])  Complete Complete  HRI or Home Care Consult  Complete   Palliative Care Screening  Not Applicable   Skilled Nursing Facility  Not Applicable

## 2023-11-25 NOTE — Plan of Care (Signed)
  Problem: Clinical Measurements: Goal: Signs and symptoms of infection will decrease Outcome: Progressing   Problem: Clinical Measurements: Goal: Diagnostic test results will improve Outcome: Progressing   Problem: Education: Goal: Knowledge of General Education information will improve Description: Including pain rating scale, medication(s)/side effects and non-pharmacologic comfort measures Outcome: Progressing   Problem: Health Behavior/Discharge Planning: Goal: Ability to manage health-related needs will improve Outcome: Progressing   Problem: Clinical Measurements: Goal: Ability to maintain clinical measurements within normal limits will improve Outcome: Progressing   Problem: Clinical Measurements: Goal: Will remain free from infection Outcome: Progressing   Problem: Clinical Measurements: Goal: Diagnostic test results will improve Outcome: Progressing   Problem: Activity: Goal: Risk for activity intolerance will decrease Outcome: Progressing   Problem: Coping: Goal: Level of anxiety will decrease Outcome: Progressing   Problem: Elimination: Goal: Will not experience complications related to bowel motility Outcome: Progressing   Problem: Skin Integrity: Goal: Risk for impaired skin integrity will decrease Outcome: Progressing

## 2023-11-25 NOTE — Progress Notes (Signed)
 [facility] PROGRESS NOTE    Progress Note  Christine Jacobson  FMW:[id number] DOB: [birth year] DOA: 11/21/2023 PCP: [name], NP     Brief Narrative:   Christine Jacobson is an 68 y.o. female past medical history of hypertrophic obstructive cardiomyopathy, HFpEF, essential hypertension recently discharged from the hospital for severe sepsis due to vulvar cellulitis and yeast infection along with A-fib with RVR during that time she was found to have an ovarian mass comes in for syncopal episode.  Assessment/Plan:   Sepsis left leg cellulitis and buttock area: Continue empirically on IV antibiotics. Blood cultures have been negative till date. Continue IV linezolid and Rocephin . Consult wound care. Anticipate she will need skilled nursing facility. Consult physical therapy. She needs to be out of bed to chair, she cannot take care of herself at home. TOC consulted for skilled nursing facility placement. Awaiting insurance authorization.  Acute kidney injury: Likely due KVO fluids. With a baseline creatinine of around 1 on admission 1.3. KVO IV fluids creatinine has returned to baseline  A-fib with RVR: Was continued on Eliquis  and low-dose metoprolol . Now rate controlled.  Syncope and collapse Secondary to hypotension in the setting with A-fib with RVR. KVO IV fluids.  Chronic diastolic dysfunction: KVO IV fluids Continue strict I's and O's and Christine weights.  Bilateral ovarian cystic mass Need to follow-up with GYN as an outpatient.    DVT prophylaxis: Eliquis  Family Communication:none Status is: Inpatient Remains inpatient appropriate because: Sepsis    Code Status:     Code Status Orders  (From admission, onward)           Start     Ordered   11/21/23 1509  Full code  Continuous       Question:  By:  Answer:  Other   11/21/23 1509           Code Status History     Date Active Date Inactive Code Status Order ID Comments  User Context   11/13/2023 1602 11/18/2023 1738 Full Code [id number]  Georgina Basket, MD ED   10/11/2023 1412 10/15/2023 1705 Full Code [id number]  Zella Katha HERO, MD ED   05/11/2023 0051 05/13/2023 1704 Full Code [id number]  Debby Camila LABOR, MD ED   12/27/2022 2021 12/31/2022 2329 Full Code [id number]  Kathrin Mignon DASEN, MD ED   09/29/2022 1809 09/30/2022 2129 Full Code [id number]  Ruthell Lonni FALCON, PA-C ED   09/15/2022 1704 09/21/2022 1746 Limited: Do not attempt resuscitation (DNR) -DNR-LIMITED -Do Not Intubate/DNI  [id number]  Stoney Blizzard, DO ED   08/04/2022 1638 08/14/2022 1927 DNR [id number]  Zella Katha HERO, MD ED   07/13/2022 1635 07/21/2022 1958 DNR [id number]  Celinda Alm Lot, MD Inpatient   07/13/2022 1534 07/13/2022 1635 Full Code [id number]  Celinda Alm Lot, MD ED   06/02/2022 1711 06/09/2022 1845 DNR [id number]  Sebastian Toribio GAILS, MD ED   10/13/2021 1632 10/15/2021 1929 Full Code [id number]  Barbarann Nest, MD ED         IV Access:   Peripheral IV   Procedures and diagnostic studies:   No results found.    Medical Consultants:   None.   Subjective:    Christine Jacobson no complaints today.  Objective:    Vitals:   11/24/23 1937 11/25/23 0012 11/25/23 0500 11/25/23 0834  BP: 124/75 139/88 (!) 141/93 (!) 148/87  Pulse: 72 (!) 101 73 78  Resp: 19 16 16 16   Temp: 97.9 F (36.6 C) 97.8 F (  36.6 C) 98 F (36.7 C) 97.9 F (36.6 C)  TempSrc: Oral Oral Oral Oral  SpO2: 97% 99% 97% 97%  Weight:      Height:       SpO2: 97 %   Intake/Output Summary (Last 24 hours) at 11/25/2023 0914 Last data filed at 11/24/2023 1941 Gross per 24 hour  Intake --  Output 350 ml  Net -350 ml   Filed Weights   11/21/23 2243  Weight: 83.3 kg    Exam: General exam: In no acute distress. Respiratory system: Good air movement and clear to auscultation. Cardiovascular system: S1 & S2 heard, RRR. No JVD. Gastrointestinal system: Abdomen is nondistended, soft and nontender.   Extremities: No pedal edema. Skin:     Psychiatry: Judgement and insight appear normal. Mood & affect appropriate.  Data Reviewed:    Labs: Basic Metabolic Panel: Recent Labs  Lab 11/21/23 0925 11/21/23 1720 11/22/23 0501 11/23/23 0433 11/24/23 0414 11/25/23 0452  NA 137  --  138 140 139 139  K 4.4  --  4.2 3.8 4.2 4.1  CL 106  --  108 109 109 110  CO2 14*  --  18* 21* 19* 18*  GLUCOSE 97  --  146* 98 117* 112*  BUN 23  --  24* 22 20 16   CREATININE 1.33*  --  1.15* 1.00 0.94 0.92  CALCIUM  9.2  --  9.0 8.9 8.8* 9.1  MG  --  1.8  --   --   --   --    GFR Estimated Creatinine Clearance: 59.4 mL/min (by C-G formula based on SCr of 0.92 mg/dL). Liver Function Tests: Recent Labs  Lab 11/21/23 0925 11/22/23 0501  AST 30 18  ALT 25 17  ALKPHOS 120 103  BILITOT 1.0 0.7  PROT 5.9* 5.4*  ALBUMIN  2.6* 2.4*   No results for input(s): LIPASE, AMYLASE in the last 168 hours. No results for input(s): AMMONIA in the last 168 hours. Coagulation profile Recent Labs  Lab 11/21/23 1747 11/22/23 0501  INR 1.4* 1.6*   COVID-19 Labs  No results for input(s): DDIMER, FERRITIN, LDH, CRP in the last 72 hours.  Lab Results  Component Value Date   SARSCOV2NAA NEGATIVE 11/21/2023   SARSCOV2NAA NEGATIVE 11/13/2023   SARSCOV2NAA NEGATIVE 02/14/2023    CBC: Recent Labs  Lab 11/21/23 0925 11/22/23 0501 11/23/23 0433 11/24/23 0414 11/25/23 0452  WBC 16.7* 13.3* 6.4 6.6 11.0*  NEUTROABS 15.9*  --   --   --   --   HGB 12.8 11.1* 9.4* 9.8* 10.6*  HCT 40.4 35.0* 29.8* 31.0* 33.9*  MCV 91.0 91.1 91.4 91.4 92.4  PLT 203 227 189 232 249   Cardiac Enzymes: Recent Labs  Lab 11/21/23 1215  CKTOTAL 38   BNP (last 3 results) Recent Labs    10/11/23 0935 10/12/23 0458  PROBNP 4,647.0* 4,823.0*   CBG: No results for input(s): GLUCAP in the last 168 hours. D-Dimer: No results for input(s): DDIMER in the last 72 hours. Hgb A1c: No results for input(s):  HGBA1C in the last 72 hours. Lipid Profile: No results for input(s): CHOL, HDL, LDLCALC, TRIG, CHOLHDL, LDLDIRECT in the last 72 hours. Thyroid function studies: No results for input(s): TSH, T4TOTAL, T3FREE, THYROIDAB in the last 72 hours.  Invalid input(s): FREET3 Anemia work up: No results for input(s): VITAMINB12, FOLATE, FERRITIN, TIBC, IRON , RETICCTPCT in the last 72 hours.  Sepsis Labs: Recent Labs  Lab 11/21/23 9050 11/21/23 1225 11/21/23 1726 11/22/23 0501 11/23/23  9566 11/24/23 0414 11/25/23 0452  WBC  --   --   --  13.3* 6.4 6.6 11.0*  LATICACIDVEN 4.4* 2.6* 1.8  --   --   --   --    Microbiology Recent Results (from the past 240 hours)  Blood culture (routine x 2)     Status: None (Preliminary result)   Collection Time: 11/21/23  9:25 AM   Specimen: BLOOD  Result Value Ref Range Status   Specimen Description BLOOD SITE NOT SPECIFIED  Final   Special Requests   Final    BOTTLES DRAWN AEROBIC ONLY Blood Culture results may not be optimal due to an inadequate volume of blood received in culture bottles   Culture   Final    NO GROWTH 4 DAYS Performed at [facility], [address], [place], [place] [address]    Report Status PENDING  Incomplete  Resp panel by RT-PCR (RSV, Flu A&B, Covid) Anterior Nasal Swab     Status: None   Collection Time: 11/21/23 10:18 AM   Specimen: Anterior Nasal Swab  Result Value Ref Range Status   SARS Coronavirus 2 by RT PCR NEGATIVE NEGATIVE Final   Influenza A by PCR NEGATIVE NEGATIVE Final   Influenza B by PCR NEGATIVE NEGATIVE Final    Comment: (NOTE) The Xpert Xpress SARS-CoV-2/FLU/RSV plus assay is intended as an aid in the diagnosis of influenza from Nasopharyngeal swab specimens and should not be used as a sole basis for treatment. Nasal washings and aspirates are unacceptable for Xpert Xpress SARS-CoV-2/FLU/RSV testing.  Fact Sheet for  Patients: bloggercourse.com  Fact Sheet for Healthcare Providers: seriousbroker.it  This test is not yet approved or cleared by the [place]  FDA and has been authorized for detection and/or diagnosis of SARS-CoV-2 by FDA under an Emergency Use Authorization (EUA). This EUA will remain in effect (meaning this test can be used) for the duration of the COVID-19 declaration under Section 564(b)(1) of the Act, 21 U.S.C. section 360bbb-3(b)(1), unless the authorization is terminated or revoked.     Resp Syncytial Virus by PCR NEGATIVE NEGATIVE Final    Comment: (NOTE) Fact Sheet for Patients: bloggercourse.com  Fact Sheet for Healthcare Providers: seriousbroker.it  This test is not yet approved or cleared by the [place]  FDA and has been authorized for detection and/or diagnosis of SARS-CoV-2 by FDA under an Emergency Use Authorization (EUA). This EUA will remain in effect (meaning this test can be used) for the duration of the COVID-19 declaration under Section 564(b)(1) of the Act, 21 U.S.C. section 360bbb-3(b)(1), unless the authorization is terminated or revoked.  Performed at [facility], [address] [place], [place] [address]   Blood culture (routine x 2)     Status: None (Preliminary result)   Collection Time: 11/21/23  5:20 PM   Specimen: BLOOD RIGHT ARM  Result Value Ref Range Status   Specimen Description BLOOD RIGHT ARM  Final   Special Requests   Final    BOTTLES DRAWN AEROBIC AND ANAEROBIC Blood Culture results may not be optimal due to an inadequate volume of blood received in culture bottles   Culture   Final    NO GROWTH 4 DAYS Performed at [facility], [address], [place], [place] [address]    Report Status PENDING  Incomplete     Medications:    amoxicillin -clavulanate  1 tablet Oral Q12H   apixaban   5 mg Oral BID    atorvastatin   80  mg Oral Christine   ezetimibe   10 mg Oral Christine   fluconazole  100 mg Oral Christine   Gerhardt's butt cream   Topical BID   linezolid  600 mg Oral Q12H   metoprolol  succinate  50 mg Oral Christine   thiamine (VITAMIN B1) injection  100 mg Intravenous Christine   Continuous Infusions:      LOS: 4 days   Erle Odell Castor  [facility]  11/25/2023, 9:14 AM

## 2023-11-26 DIAGNOSIS — L03317 Cellulitis of buttock: Secondary | ICD-10-CM | POA: Diagnosis not present

## 2023-11-26 DIAGNOSIS — W19XXXA Unspecified fall, initial encounter: Secondary | ICD-10-CM | POA: Diagnosis not present

## 2023-11-26 DIAGNOSIS — I4891 Unspecified atrial fibrillation: Secondary | ICD-10-CM | POA: Diagnosis not present

## 2023-11-26 DIAGNOSIS — R55 Syncope and collapse: Secondary | ICD-10-CM | POA: Diagnosis not present

## 2023-11-26 LAB — CBC
HCT: 33.8 % — ABNORMAL LOW (ref 36.0–46.0)
Hemoglobin: 10.7 g/dL — ABNORMAL LOW (ref 12.0–15.0)
MCH: 28.5 pg (ref 26.0–34.0)
MCHC: 31.7 g/dL (ref 30.0–36.0)
MCV: 90.1 fL (ref 80.0–100.0)
Platelets: 285 K/uL (ref 150–400)
RBC: 3.75 MIL/uL — ABNORMAL LOW (ref 3.87–5.11)
RDW: 16.2 % — ABNORMAL HIGH (ref 11.5–15.5)
WBC: 12.6 K/uL — ABNORMAL HIGH (ref 4.0–10.5)
nRBC: 0.2 % (ref 0.0–0.2)

## 2023-11-26 LAB — CULTURE, BLOOD (ROUTINE X 2)
Culture: NO GROWTH
Culture: NO GROWTH

## 2023-11-26 MED ORDER — HYDROXYZINE HCL 10 MG PO TABS
10.0000 mg | ORAL_TABLET | Freq: Three times a day (TID) | ORAL | Status: DC | PRN
  Administered 2023-11-26 – 2023-11-27 (×2): 10 mg via ORAL
  Filled 2023-11-26 (×2): qty 1

## 2023-11-26 MED ORDER — HYDROCORTISONE 1 % EX CREA
1.0000 | TOPICAL_CREAM | Freq: Three times a day (TID) | CUTANEOUS | Status: DC | PRN
  Administered 2023-11-26: 1 via TOPICAL
  Filled 2023-11-26: qty 28

## 2023-11-26 NOTE — Plan of Care (Signed)
  Problem: Clinical Measurements: Goal: Diagnostic test results will improve Outcome: Progressing   Problem: Education: Goal: Knowledge of General Education information will improve Description: Including pain rating scale, medication(s)/side effects and non-pharmacologic comfort measures Outcome: Progressing   Problem: Health Behavior/Discharge Planning: Goal: Ability to manage health-related needs will improve Outcome: Progressing   Problem: Clinical Measurements: Goal: Ability to maintain clinical measurements within normal limits will improve Outcome: Progressing   Problem: Clinical Measurements: Goal: Diagnostic test results will improve Outcome: Progressing   Problem: Clinical Measurements: Goal: Will remain free from infection Outcome: Progressing   Problem: Clinical Measurements: Goal: Cardiovascular complication will be avoided Outcome: Progressing   Problem: Activity: Goal: Risk for activity intolerance will decrease Outcome: Progressing   Problem: Coping: Goal: Level of anxiety will decrease Outcome: Progressing   Problem: Safety: Goal: Ability to remain free from injury will improve Outcome: Progressing   Problem: Pain Managment: Goal: General experience of comfort will improve and/or be controlled Outcome: Progressing   Problem: Skin Integrity: Goal: Risk for impaired skin integrity will decrease Outcome: Progressing   Problem: Activity: Goal: Capacity to carry out activities will improve Outcome: Progressing

## 2023-11-26 NOTE — Plan of Care (Signed)

## 2023-11-26 NOTE — Progress Notes (Signed)
 [facility] PROGRESS NOTE    Progress Note  Christine Jacobson  FMW:[id number] DOB: 1955-05-30 DOA: 11/21/2023 PCP: [name], NP     Brief Narrative:   Christine Jacobson is an 67 y.o. female past medical history of hypertrophic obstructive cardiomyopathy, HFpEF, essential hypertension recently discharged from the hospital for severe sepsis due to vulvar cellulitis and yeast infection along with A-fib with RVR during that time she was found to have an ovarian mass comes in for syncopal episode.  Assessment/Plan:   Sepsis left leg cellulitis and buttock area: Blood cultures have been negative till date. Continue IV linezolid and Rocephin .  Now with a new rash discontinue Rocephin . Consult wound care. Consult physical therapy. She needs to be out of bed to chair, she cannot take care of herself at home. TOC consulted for skilled nursing facility placement. Awaiting insurance authorization.  New diffuse maculopapular rash/leukocytosis: Question antibiotic reaction.  On admission she was initially on IV vancomycin  and pip-tazo de-escalated to Rocephin  She is currently on linezolid and Rocephin .  She did receive 1 dose of Augmentin  11/24/2023. Consult ID.  Hold Rocephin   Acute kidney injury: Likely due KVO fluids. With a baseline creatinine of around 1 on admission 1.3. KVO IV fluids creatinine has returned to baseline  A-fib with RVR: Was continued on Eliquis  and low-dose metoprolol . Now rate controlled.  Syncope and collapse Secondary to hypotension in the setting with A-fib with RVR. KVO IV fluids.  Chronic diastolic dysfunction: KVO IV fluids Continue strict I's and O's and daily weights.  Bilateral ovarian cystic mass Need to follow-up with GYN as an outpatient.    DVT prophylaxis: Eliquis  Family Communication:none Status is: Inpatient Remains inpatient appropriate because: Sepsis    Code Status:     Code Status Orders  (From admission,  onward)           Start     Ordered   11/21/23 1509  Full code  Continuous       Question:  By:  Answer:  Other   11/21/23 1509           Code Status History     Date Active Date Inactive Code Status Order ID Comments User Context   11/13/2023 1602 11/18/2023 1738 Full Code [id number]  Georgina Basket, MD ED   10/11/2023 1412 10/15/2023 1705 Full Code [id number]  Zella Katha HERO, MD ED   05/11/2023 0051 05/13/2023 1704 Full Code [id number]  Debby Camila LABOR, MD ED   12/27/2022 2021 12/31/2022 2329 Full Code [id number]  Kathrin Mignon DASEN, MD ED   09/29/2022 1809 09/30/2022 2129 Full Code [id number]  Ruthell Lonni FALCON, PA-C ED   09/15/2022 1704 09/21/2022 1746 Limited: Do not attempt resuscitation (DNR) -DNR-LIMITED -Do Not Intubate/DNI  [id number]  Stoney Blizzard, DO ED   08/04/2022 1638 08/14/2022 1927 DNR [id number]  Zella Katha HERO, MD ED   07/13/2022 1635 07/21/2022 1958 DNR [id number]  Celinda Alm Lot, MD Inpatient   07/13/2022 1534 07/13/2022 1635 Full Code [id number]  Celinda Alm Lot, MD ED   06/02/2022 1711 06/09/2022 1845 DNR [id number]  Sebastian Toribio GAILS, MD ED   10/13/2021 1632 10/15/2021 1929 Full Code [id number]  Barbarann Nest, MD ED         IV Access:   Peripheral IV   Procedures and diagnostic studies:   No results found.    Medical Consultants:   None.   Subjective:    Christine Jacobson no complaints today.  Objective:    Vitals:  11/25/23 1624 11/25/23 1947 11/25/23 2315 11/26/23 0531  BP: (!) 144/93 129/77 (!) 145/81 (!) 166/92  Pulse: 84 73 68 73  Resp: 20 18 18 16   Temp: 98 F (36.7 C) 98.2 F (36.8 C) 98.3 F (36.8 C) 97.6 F (36.4 C)  TempSrc: Oral Oral Oral Oral  SpO2: 98% 98% 98% 94%  Weight:      Height:       SpO2: 94 %   Intake/Output Summary (Last 24 hours) at 11/26/2023 0752 Last data filed at 11/26/2023 0304 Gross per 24 hour  Intake 1180 ml  Output 100 ml  Net 1080 ml   Filed Weights   11/21/23 2243  Weight: 83.3  kg    Exam: General exam: In no acute distress. Respiratory system: Good air movement and clear to auscultation. Cardiovascular system: S1 & S2 heard, RRR. No JVD. Gastrointestinal system: Abdomen is nondistended, soft and nontender.  Central nervous system: Alert and oriented. No focal neurological deficits. Extremities: No pedal edema. Skin:  Skin:      Data Reviewed:    Labs: Basic Metabolic Panel: Recent Labs  Lab 11/21/23 0925 11/21/23 1720 11/22/23 0501 11/23/23 0433 11/24/23 0414 11/25/23 0452  NA 137  --  138 140 139 139  K 4.4  --  4.2 3.8 4.2 4.1  CL 106  --  108 109 109 110  CO2 14*  --  18* 21* 19* 18*  GLUCOSE 97  --  146* 98 117* 112*  BUN 23  --  24* 22 20 16   CREATININE 1.33*  --  1.15* 1.00 0.94 0.92  CALCIUM  9.2  --  9.0 8.9 8.8* 9.1  MG  --  1.8  --   --   --   --    GFR Estimated Creatinine Clearance: 59.4 mL/min (by C-G formula based on SCr of 0.92 mg/dL). Liver Function Tests: Recent Labs  Lab 11/21/23 0925 11/22/23 0501  AST 30 18  ALT 25 17  ALKPHOS 120 103  BILITOT 1.0 0.7  PROT 5.9* 5.4*  ALBUMIN  2.6* 2.4*   No results for input(s): LIPASE, AMYLASE in the last 168 hours. No results for input(s): AMMONIA in the last 168 hours. Coagulation profile Recent Labs  Lab 11/21/23 1747 11/22/23 0501  INR 1.4* 1.6*   COVID-19 Labs  No results for input(s): DDIMER, FERRITIN, LDH, CRP in the last 72 hours.  Lab Results  Component Value Date   SARSCOV2NAA NEGATIVE 11/21/2023   SARSCOV2NAA NEGATIVE 11/13/2023   SARSCOV2NAA NEGATIVE 02/14/2023    CBC: Recent Labs  Lab 11/21/23 0925 11/22/23 0501 11/23/23 0433 11/24/23 0414 11/25/23 0452 11/26/23 0348  WBC 16.7* 13.3* 6.4 6.6 11.0* 12.6*  NEUTROABS 15.9*  --   --   --   --   --   HGB 12.8 11.1* 9.4* 9.8* 10.6* 10.7*  HCT 40.4 35.0* 29.8* 31.0* 33.9* 33.8*  MCV 91.0 91.1 91.4 91.4 92.4 90.1  PLT 203 227 189 232 249 285   Cardiac Enzymes: Recent Labs  Lab  11/21/23 1215  CKTOTAL 38   BNP (last 3 results) Recent Labs    10/11/23 0935 10/12/23 0458  PROBNP 4,647.0* 4,823.0*   CBG: No results for input(s): GLUCAP in the last 168 hours. D-Dimer: No results for input(s): DDIMER in the last 72 hours. Hgb A1c: No results for input(s): HGBA1C in the last 72 hours. Lipid Profile: No results for input(s): CHOL, HDL, LDLCALC, TRIG, CHOLHDL, LDLDIRECT in the last 72 hours. Thyroid function studies: No  results for input(s): TSH, T4TOTAL, T3FREE, THYROIDAB in the last 72 hours.  Invalid input(s): FREET3 Anemia work up: No results for input(s): VITAMINB12, FOLATE, FERRITIN, TIBC, IRON , RETICCTPCT in the last 72 hours.  Sepsis Labs: Recent Labs  Lab 11/21/23 0949 11/21/23 1225 11/21/23 1726 11/22/23 0501 11/23/23 0433 11/24/23 0414 11/25/23 0452 11/26/23 0348  WBC  --   --   --    < > 6.4 6.6 11.0* 12.6*  LATICACIDVEN 4.4* 2.6* 1.8  --   --   --   --   --    < > = values in this interval not displayed.   Microbiology Recent Results (from the past 240 hours)  Blood culture (routine x 2)     Status: None (Preliminary result)   Collection Time: 11/21/23  9:25 AM   Specimen: BLOOD  Result Value Ref Range Status   Specimen Description BLOOD SITE NOT SPECIFIED  Final   Special Requests   Final    BOTTLES DRAWN AEROBIC ONLY Blood Culture results may not be optimal due to an inadequate volume of blood received in culture bottles   Culture   Final    NO GROWTH 4 DAYS Performed at [facility], [address], [place], [place] [address]    Report Status PENDING  Incomplete  Resp panel by RT-PCR (RSV, Flu A&B, Covid) Anterior Nasal Swab     Status: None   Collection Time: 11/21/23 10:18 AM   Specimen: Anterior Nasal Swab  Result Value Ref Range Status   SARS Coronavirus 2 by RT PCR NEGATIVE NEGATIVE Final   Influenza A by PCR NEGATIVE NEGATIVE Final   Influenza B by PCR NEGATIVE NEGATIVE  Final    Comment: (NOTE) The Xpert Xpress SARS-CoV-2/FLU/RSV plus assay is intended as an aid in the diagnosis of influenza from Nasopharyngeal swab specimens and should not be used as a sole basis for treatment. Nasal washings and aspirates are unacceptable for Xpert Xpress SARS-CoV-2/FLU/RSV testing.  Fact Sheet for Patients: bloggercourse.com  Fact Sheet for Healthcare Providers: seriousbroker.it  This test is not yet approved or cleared by the [place]  FDA and has been authorized for detection and/or diagnosis of SARS-CoV-2 by FDA under an Emergency Use Authorization (EUA). This EUA will remain in effect (meaning this test can be used) for the duration of the COVID-19 declaration under Section 564(b)(1) of the Act, 21 U.S.C. section 360bbb-3(b)(1), unless the authorization is terminated or revoked.     Resp Syncytial Virus by PCR NEGATIVE NEGATIVE Final    Comment: (NOTE) Fact Sheet for Patients: bloggercourse.com  Fact Sheet for Healthcare Providers: seriousbroker.it  This test is not yet approved or cleared by the [place]  FDA and has been authorized for detection and/or diagnosis of SARS-CoV-2 by FDA under an Emergency Use Authorization (EUA). This EUA will remain in effect (meaning this test can be used) for the duration of the COVID-19 declaration under Section 564(b)(1) of the Act, 21 U.S.C. section 360bbb-3(b)(1), unless the authorization is terminated or revoked.  Performed at [facility], [address], [place], [place] [address]   Blood culture (routine x 2)     Status: None (Preliminary result)   Collection Time: 11/21/23  5:20 PM   Specimen: BLOOD RIGHT ARM  Result Value Ref Range Status   Specimen Description BLOOD RIGHT ARM  Final   Special Requests   Final    BOTTLES DRAWN AEROBIC AND ANAEROBIC Blood Culture results may not be optimal  due to  an inadequate volume of blood received in culture bottles   Culture   Final    NO GROWTH 4 DAYS Performed at [facility], [address], [place], [place] [address]    Report Status PENDING  Incomplete     Medications:    apixaban   5 mg Oral BID   atorvastatin   80 mg Oral Daily   ezetimibe   10 mg Oral Daily   fluconazole  100 mg Oral Daily   Gerhardt's butt cream   Topical BID   linezolid  600 mg Oral Q12H   metoprolol  succinate  50 mg Oral Daily   thiamine  100 mg Oral Daily   Continuous Infusions:  cefTRIAXone  (ROCEPHIN )  IV Stopped (11/25/23 1107)       LOS: 5 days   Erle Odell Castor  [facility]  11/26/2023, 7:52 AM

## 2023-11-26 NOTE — Consult Note (Signed)
 [facility]  Total days of antibiotics 6 Reason for Consult:rash, concern for abtx rash    Referring Physician: odell castor  Principal Problem:   Syncope and collapse Active Problems:   Left leg cellulitis   Atrial fibrillation with RVR (HCC)   Ovarian mass   (HFpEF) heart failure with preserved ejection fraction (HCC)    HPI: Christine Jacobson is a 68 y.o. female with HFpEF, atrial fib with RVR, ovarian mass,chronic venous stasis, admitted on 11/4 for syncope. Found to have possible cellulitis, right leg tissue injury/pressure ulcer as source of her infection. On exam, also found to have candidal skin infection by pannus. She was started on ceftriaxone  and linezolid and fluconazole. She was doing well but started to have pruritic diffuse rash to torso and arms that started yesterday. She also noticed to have small increase in her wbc from 11 to 12.6K. ID asked to weigh in on managment  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   2D ECHO - EF >55%, normal   HTN (hypertension)    Hyperprolactinemia    dx in her 18, took meds, self d/c a while back   Liver function test abnormality    normal when repeated   Mild hyperlipidemia    Obesity    Palpitations    negative stress echo in November 2012 with normal LV function; mild LVH, proximal septal thickening with narrow LVOT and mild gradient; mild MR and TR; Cardionet showed PACs in November 2012   Pyoderma gangrenosa (HCC)    Shortness of breath 07/11/2011   MET TEST    Allergies:  Allergies  Allergen Reactions   Porcine (Pork) Protein-Containing Drug Products Other (See Comments)    Religious preference    Current antibiotics:   MEDICATIONS:  apixaban   5 mg Oral BID    atorvastatin   80 mg Oral Daily   ezetimibe   10 mg Oral Daily   fluconazole  100 mg Oral Daily   Gerhardt's butt cream   Topical BID   linezolid  600 mg Oral Q12H   metoprolol  succinate  50 mg Oral Daily   thiamine  100 mg Oral Daily    Social History   Tobacco Use   Smoking status: Former    Current packs/day: 1.00    Average packs/day: 1 pack/day for 4.0 years (4.0 ttl pk-yrs)    Types: Cigarettes   Smokeless tobacco: Never  Vaping Use   Vaping status: Never Used  Substance Use Topics   Alcohol use: Not Currently    Alcohol/week: 1.0 standard drink of alcohol    Types: 1 Glasses of wine per week    Comment: I do have my wine every day, has cut down from 2 bottles at a time, currently 2-3 glasses   Drug use: No    Family History  Problem Relation Age of Onset   Heart disease Mother        endocarditis   Ovarian cancer Mother    Emphysema Father    Coronary artery disease Other        F in his 21   Diabetes Other        GP   Cancer Neg Hx     Review of Systems -  +itching to torso. Otherwise  12 point ros is otherwise negative  OBJECTIVE: Temp:  [97.6 F (36.4 C)-98.3 F (36.8 C)] 97.7 F (36.5 C) (11/09 1143) Pulse Rate:  [68-84] 73 (11/09 1143) Resp:  [16-20] 16 (11/09 1143) BP: (119-166)/(77-93) 119/87 (11/09 1143) SpO2:  [94 %-98 %] 94 % (11/09 0531) Physical Exam  Constitutional:  oriented to person, place, and time. appears well-developed and well-nourished. No distress.  HENT: [place]/AT, PERRLA, no scleral icterus Mouth/Throat: Oropharynx is clear and moist. No oropharyngeal exudate.  Cardiovascular: Normal rate, regular rhythm and normal heart sounds. Exam reveals no gallop and no friction rub.  No murmur heard.  Pulmonary/Chest: Effort normal and breath sounds normal. No respiratory distress.  has no wheezes.  Neck = supple, no nuchal rigidity Abdominal: Soft. Bowel sounds are normal.  exhibits no distension. There is no tenderness.  Ext: less pedal  edema bilaterally, some chronic venous stasis changes. Shallow ulcer clean base to right calf. See photos.   Skin: diffuse small maculpapular rash to arms and torso predominantly Psychiatric: a normal mood and affect.  behavior is normal.   LABS: Results for orders placed or performed during the hospital encounter of 11/21/23 (from the past 48 hours)  Basic metabolic panel with GFR     Status: Abnormal   Collection Time: 11/25/23  4:52 AM  Result Value Ref Range   Sodium 139 135 - 145 mmol/L   Potassium 4.1 3.5 - 5.1 mmol/L   Chloride 110 98 - 111 mmol/L   CO2 18 (L) 22 - 32 mmol/L   Glucose, Bld 112 (H) 70 - 99 mg/dL    Comment: Glucose reference range applies only to samples taken after fasting for at least 8 hours.   BUN 16 8 - 23 mg/dL   Creatinine, Ser 9.07 0.44 - 1.00 mg/dL   Calcium  9.1 8.9 - 10.3 mg/dL   GFR, Estimated >39 >39 mL/min    Comment: (NOTE) Calculated using the CKD-EPI Creatinine Equation (2021)    Anion gap 11 5 - 15    Comment: Performed at [facility], [address] [place], [place] [address]  CBC     Status: Abnormal   Collection Time: 11/25/23  4:52 AM  Result Value Ref Range   WBC 11.0 (H) 4.0 - 10.5 K/uL   RBC 3.67 (L) 3.87 - 5.11 MIL/uL   Hemoglobin 10.6 (L) 12.0 - 15.0 g/dL   HCT 66.0 (L) 63.9 - 53.9 %   MCV 92.4 80.0 - 100.0 fL   MCH 28.9 26.0 - 34.0 pg   MCHC 31.3 30.0 - 36.0 g/dL   RDW 83.5 (H) 88.4 - 84.4 %   Platelets 249 150 - 400 K/uL   nRBC 0.3 (H) 0.0 - 0.2 %    Comment: Performed at [facility], [address], [place], [place] [address]  CBC     Status: Abnormal   Collection Time: 11/26/23  3:48 AM  Result Value Ref Range   WBC 12.6 (H) 4.0 - 10.5 K/uL   RBC 3.75 (L) 3.87 - 5.11 MIL/uL   Hemoglobin 10.7 (L) 12.0 - 15.0 g/dL   HCT 66.1 (L) 63.9 - 53.9 %   MCV 90.1 80.0 - 100.0 fL   MCH 28.5 26.0 - 34.0 pg   MCHC 31.7 30.0 - 36.0 g/dL   RDW 83.7 (H) 88.4 - 84.4 %   Platelets 285 150 - 400 K/uL   nRBC 0.2 0.0 - 0.2  %    Comment: Performed at [facility], [address] [place], [place] [address]    MICRO: Blood cx 11/4 NGTD IMAGING: No results found.  HISTORICAL MICRO/IMAGING  Assessment/Plan:  68yo F with pressure ulcer/surrounding cellulitis to right leg complicated by developing cephalosporin rash, most likely  - agree with stopping cephalosporin - abtx rash = recommend scheduling 2 days of atarax  to prevent excoriation, then PRN -would recommend 3 days of prednisone  40mg  daily to help curtail extent of spread of rash, watch for associated hyperglycemia - cellulitis/ulcer = continue with linezolid for 2 more days then just need to continue with local wound care - candidal skin infection = continue with topical agents. Can use the silver impregnated cloth to absorb moisture - leukocytosis = in response to allergy reaction rather than infection  evaluation of this patient requires complex antimicrobial therapy evaluation and counseling and isolation needs for disease transmission risk assessment and mitigation.   Will sign off.  I have personally spent 80 minutes involved in face-to-face and non-face-to-face activities for this patient on the day of the visit. Professional time spent includes the following activities: Preparing to see the patient (review of tests), Obtaining and/or reviewing separately obtained history (admission/discharge record), Performing a medically appropriate examination and/or evaluation , Ordering medications/tests/procedures, referring and communicating with other health care professionals, Documenting clinical information in the EMR, Independently interpreting results (not separately reported), Communicating results to the patient, Counseling and educating the patient.

## 2023-11-27 DIAGNOSIS — L03317 Cellulitis of buttock: Secondary | ICD-10-CM | POA: Diagnosis not present

## 2023-11-27 DIAGNOSIS — R55 Syncope and collapse: Secondary | ICD-10-CM | POA: Diagnosis not present

## 2023-11-27 DIAGNOSIS — I4891 Unspecified atrial fibrillation: Secondary | ICD-10-CM | POA: Diagnosis not present

## 2023-11-27 MED ORDER — PREDNISONE 20 MG PO TABS: 40.0000 mg | ORAL_TABLET | Freq: Every day | ORAL | Status: AC

## 2023-11-27 MED ORDER — PREDNISONE 20 MG PO TABS
40.0000 mg | ORAL_TABLET | Freq: Every day | ORAL | Status: DC
  Administered 2023-11-27: 40 mg via ORAL
  Filled 2023-11-27: qty 2

## 2023-11-27 MED ORDER — HYDROXYZINE HCL 10 MG PO TABS: 10.0000 mg | ORAL_TABLET | Freq: Three times a day (TID) | ORAL | Status: DC | PRN

## 2023-11-27 MED ORDER — HYDROCORTISONE 1 % EX CREA: 1.0000 | TOPICAL_CREAM | Freq: Three times a day (TID) | CUTANEOUS | Status: AC | PRN

## 2023-11-27 NOTE — TOC Progression Note (Signed)
 Transition of Care [facility]) - Progression Note    Patient Details  Name: Christine Jacobson MRN: [id number] Date of Birth: [birth year]  [facility] CM/SW Contact  Isaiah Public, LCSWA Phone Number: 11/27/2023, 10:25 AM  Clinical Narrative:      Glenys with [facility] informed CSW that patients insurance authorization has been approved for SNF. CSW informed MD.                   Expected Discharge Plan and Services         Expected Discharge Date: 11/27/23                                     Social Drivers of Health (SDOH) Interventions SDOH Screenings   Food Insecurity: No Food Insecurity (11/21/2023)  Housing: Low Risk  (11/21/2023)  Transportation Needs: No Transportation Needs (11/21/2023)  Utilities: Not At Risk (11/21/2023)  Depression (PHQ2-9): Low Risk  (10/26/2021)  Financial Resource Strain: Low Risk  (12/03/2021)  Social Connections: Moderately Isolated (11/21/2023)  Tobacco Use: Medium Risk (11/21/2023)    Readmission Risk Interventions    11/16/2023   10:02 AM 05/12/2023    4:15 PM 12/28/2022   11:28 AM  Readmission Risk Prevention Plan  Transportation Screening Complete Complete Complete  PCP or Specialist Appt within 3-5 Days Complete  Complete  HRI or Home Care Consult Complete  Complete  Social Work Consult for Recovery Care Planning/Counseling Complete  Complete  Palliative Care Screening Not Applicable  Not Applicable  Medication Review [employer])  Complete Complete  HRI or Home Care Consult  Complete   Palliative Care Screening  Not Applicable   Skilled Nursing Facility  Not Applicable

## 2023-11-27 NOTE — Care Management Important Message (Signed)
 Important Message  Patient Details  Name: Christine Jacobson MRN: [id number] Date of Birth: Jan 10, 1956   Important Message Given:  Yes - Medicare IM     Vonzell Arrie Sharps 11/27/2023, 10:45 AM

## 2023-11-27 NOTE — Discharge Summary (Signed)
 Physician Discharge Summary  Christine Jacobson FMW:[id number] DOB: 03-20-1955 DOA: 11/21/2023  PCP: [name], NP  Admit date: 11/21/2023 Discharge date: 11/27/2023  Admitted From: Home Disposition:  SNF  Recommendations for Outpatient Follow-up:  Follow up with PCP in 1-2 weeks Please obtain BMP/CBC in one week   Home Health:No Equipment/Devices:None  Discharge Condition:Stable CODE STATUS:Full Diet recommendation: Heart Healthy  Brief/Interim Summary: 68 y.o. female past medical history of hypertrophic obstructive cardiomyopathy, HFpEF, essential hypertension recently discharged from the hospital for severe sepsis due to vulvar cellulitis and yeast infection along with A-fib with RVR during that time she was found to have an ovarian mass comes in for syncopal episode.    Discharge Diagnoses:  Principal Problem:   Syncope and collapse Active Problems:   Left leg cellulitis   Atrial fibrillation with RVR (HCC)   Ovarian mass   (HFpEF) heart failure with preserved ejection fraction (HCC)  Sepsis due to left lower extremity cellulitis and buttock area: She will start empirically on IV linezolid, and Rocephin . Blood cultures were obtained which remain negative till date. Wound care was consulted and recommended daily dressing changes. I am not sure if she was taking her medications at home as she was just recently discharged she was restarted back on her regimen and her sepsis is resolved. She developed a rash to Rocephin  so cephalosporins was discontinued. She will continue linezolid and Diflucan. PT evaluated the patient recommended skilled nursing facility. As the patient cannot take care of herself at home.  New maculopapular rash/leukocytosis: Likely due to Rocephin  this was discontinued she was started on Atarax  and prednisone  she will continue this for 3 days postdischarge.  Acute kidney injury: Likely due to sepsis resolved with IV fluids.  A-fib with  RVR: Was continue low-dose metoprolol  and Eliquis  no changes made to her medication.  Syncope and collapse:  likely due to hypotension in the setting of sepsis and RVR  Chronic diastolic dysfunction: No change made to her medication.  Bilateral ovarian cystic mass: Follow-up with GYN as an outpatient. Discharge Instructions  Discharge Instructions     Diet - low sodium heart healthy   Complete by: As directed    Diet - low sodium heart healthy   Complete by: As directed    Discharge wound care:   Complete by: As directed    Continue wound dressings per wound care instructions.   Discharge wound care:   Complete by: As directed    Per wound care instructions   Increase activity slowly   Complete by: As directed    Increase activity slowly   Complete by: As directed       Allergies as of 11/27/2023       Reactions   Porcine (pork) Protein-containing Drug Products Other (See Comments)   Religious preference        Medication List     TAKE these medications    acetaminophen  325 MG tablet Commonly known as: TYLENOL  Take 2 tablets (650 mg total) by mouth every 6 (six) hours as needed for mild pain (pain score 1-3) or fever (or Fever >/= 101).   amoxicillin -clavulanate 875-125 MG tablet Commonly known as: AUGMENTIN  Take 1 tablet by mouth every 12 (twelve) hours for 7 days.   apixaban  5 MG Tabs tablet Commonly known as: Eliquis  Take 1 tablet (5 mg total) by mouth 2 (two) times daily.   atorvastatin  80 MG tablet Commonly known as: LIPITOR Take 1 tablet (80 mg total) by mouth daily.  ezetimibe  10 MG tablet Commonly known as: Zetia  Take 1 tablet (10 mg total) by mouth daily for cholesterol.   fluconazole 100 MG tablet Commonly known as: DIFLUCAN Take 1 tablet (100 mg total) by mouth daily for 7 days.   furosemide  40 MG tablet Commonly known as: Lasix  Take 1 tablet (40 mg total) by mouth in the morning.   hydrocortisone  cream 1 % Apply 1 Application  topically 3 (three) times daily as needed for itching (minor skin irritation).   hydrOXYzine  10 MG tablet Commonly known as: ATARAX  Take 1 tablet (10 mg total) by mouth 3 (three) times daily as needed for itching.   linezolid 600 MG tablet Commonly known as: ZYVOX Take 1 tablet (600 mg total) by mouth every 12 (twelve) hours for 7 days.   metoprolol  succinate 50 MG 24 hr tablet Commonly known as: TOPROL -XL Take 1 tablet (50 mg total) by mouth daily. Take with or immediately following a meal.   pantoprazole  40 MG tablet Commonly known as: PROTONIX  Take 1 tablet (40 mg total) by mouth daily.   Potassium Chloride  ER 20 MEQ Tbcr Take 1 tablet (20 mEq total) by mouth every other day.   predniSONE  20 MG tablet Commonly known as: DELTASONE  Take 2 tablets (40 mg total) by mouth daily with breakfast for 4 days.   Zinc  Sulfate 220 (50 Zn) MG Tabs Take 1 tablet (220 mg total) by mouth daily for 30 days               Discharge Care Instructions  (From admission, onward)           Start     Ordered   11/27/23 0000  Discharge wound care:       Comments: Per wound care instructions   11/27/23 0817   11/24/23 0000  Discharge wound care:       Comments: Continue wound dressings per wound care instructions.   11/24/23 0743            Contact information for after-discharge care     Destination     [facility] [place], [place] .   Service: Skilled Nursing Contact information: [address] [place]  [address] [phone]                    Allergies  Allergen Reactions   Porcine (Pork) Protein-Containing Drug Products Other (See Comments)    Religious preference    Consultations: Infectious disease   Procedures/Studies: CT ABDOMEN PELVIS W CONTRAST Result Date: 11/21/2023 EXAM: CT ABDOMEN AND PELVIS WITH CONTRAST 11/21/2023 02:33:50 PM TECHNIQUE: CT of the abdomen and pelvis was performed with the administration of 75 mL of  iohexol  (OMNIPAQUE ) 350 MG/ML injection. Multiplanar reformatted images are provided for review. Automated exposure control, iterative reconstruction, and/or weight-based adjustment of the mA/kV was utilized to reduce the radiation dose to as low as reasonably achievable. COMPARISON: CT 11/13/2023. CLINICAL HISTORY: Sepsis. FINDINGS: LOWER CHEST: No acute abnormality. LIVER: The liver is unremarkable. GALLBLADDER AND BILE DUCTS: Gallbladder is unremarkable. No biliary ductal dilatation. SPLEEN: No acute abnormality. PANCREAS: No acute abnormality. ADRENAL GLANDS: No acute abnormality. KIDNEYS, URETERS AND BLADDER: No stones in the kidneys or ureters. No hydronephrosis. No perinephric or periureteral stranding. Urinary bladder is unremarkable. GI AND BOWEL: Stomach demonstrates no acute abnormality. There is no bowel obstruction. Normal appendix. PERITONEUM AND RETROPERITONEUM: No ascites. No free air. VASCULATURE: Aorta is normal in caliber. LYMPH NODES: No lymphadenopathy. REPRODUCTIVE ORGANS: Large cystic lesions of the  ovaries are redemonstrated. The cystic lesion in the right ovary measures 11.8 cm and in the left ovary measures 9.0 cm. These are not substantially changed from 11/13/2023. BONES AND SOFT TISSUES: No acute osseous abnormality. No focal soft tissue abnormality. IMPRESSION: 1. No acute abnormality in the abdomen or pelvis. 2. Large bilateral ovarian cystic lesions, right 11.8 cm and left 9.0 cm, not substantially changed from 11/13/23. Findings are concerning for low-grade cystic neoplasm; recommend gynecology consultation if not already obtained. Consider nonemergent pelvic MR with IV contrast if clinically appropriate. Electronically signed by: Norman Gatlin MD 11/21/2023 03:01 PM EST RP Workstation: [id number]   DG Chest Portable 1 View Result Date: 11/21/2023 EXAM: 1 VIEW(S) XRAY OF THE CHEST 11/21/2023 10:34:00 AM COMPARISON: None available. CLINICAL HISTORY: sepsis, cough? FINDINGS: LUNGS  AND PLEURA: No focal pulmonary opacity. No pulmonary edema. No pleural effusion. No pneumothorax. HEART AND MEDIASTINUM: Mild cardiomegaly. BONES AND SOFT TISSUES: No acute osseous abnormality. IMPRESSION: 1. No acute cardiopulmonary process. Electronically signed by: Norleen Boxer MD 11/21/2023 11:45 AM EST RP Workstation: [id number]   DG Tibia/Fibula Right Result Date: 11/13/2023 EXAM: _VIEWS_ VIEW(S) XRAY OF THE _LATERALITY_ TIBIA AND FIBULA 11/13/2023 02:35:48 PM COMPARISON: None available. CLINICAL HISTORY: wound FINDINGS: BONES AND JOINTS: No acute fracture. No focal osseous lesion. No joint dislocation. SOFT TISSUES: Diffuse subcutaneous soft tissue edema. IMPRESSION: 1. Diffuse subcutaneous soft tissue edema. 2. No acute osseous findings. Electronically signed by: Waddell Calk MD 11/13/2023 03:37 PM EDT RP Workstation: [id number]   CT CHEST ABDOMEN PELVIS W CONTRAST Result Date: 11/13/2023 CLINICAL DATA:  Sepsis * Tracking Code: BO * EXAM: CT CHEST, ABDOMEN, AND PELVIS WITH CONTRAST TECHNIQUE: Multidetector CT imaging of the chest, abdomen and pelvis was performed following the standard protocol during bolus administration of intravenous contrast. RADIATION DOSE REDUCTION: This exam was performed according to the departmental dose-optimization program which includes automated exposure control, adjustment of the mA and/or kV according to patient size and/or use of iterative reconstruction technique. CONTRAST:  75mL OMNIPAQUE  IOHEXOL  350 MG/ML SOLN COMPARISON:  CT chest angiogram, 05/10/2023 FINDINGS: CT CHEST FINDINGS Cardiovascular: Scattered aortic atherosclerosis. Cardiomegaly. Three-vessel coronary artery calcifications. No pericardial effusion. Mediastinum/Nodes: Unchanged prominent mediastinal lymph nodes, high right paratracheal node measuring 1.6 x 0.8 cm (series 3, image 13). Heterogeneous nodular thyroid and isthmus (series 3, image 12). Trachea, and esophagus demonstrate no significant  findings. Lungs/Pleura: Diffuse interlobular septal thickening, mosaic attenuation of the airspaces, and diffuse bilateral bronchial wall thickening. Small benign calcified nodule of the medial right lung base requiring no further follow-up or characterization (series 5, image 110). No pleural effusion or pneumothorax. Musculoskeletal: No chest wall abnormality. No acute osseous findings. CT ABDOMEN PELVIS FINDINGS Hepatobiliary: No solid liver abnormality is seen. No gallstones, gallbladder wall thickening, or biliary dilatation. Pancreas: Unremarkable. No pancreatic ductal dilatation or surrounding inflammatory changes. Spleen: Normal in size without significant abnormality. Adrenals/Urinary Tract: Adrenal glands are unremarkable. Kidneys are normal, without renal calculi, solid lesion, or hydronephrosis. Bladder is unremarkable. Stomach/Bowel: Stomach is within normal limits. Appendix appears normal. No evidence of bowel wall thickening, distention, or inflammatory changes. Vascular/Lymphatic: Aortic atherosclerosis. No enlarged abdominal or pelvic lymph nodes. Reproductive: Large cystic lesions of the ovaries, on the right, situated in the right lower quadrant measuring 12.2 x 8.1 x 8.4 cm (series 3, image 93) and arising from the left ovary situated in the posterior pelvis with a visible internal septation measuring 9.0 x 7.9 x 6.6 cm (series 3, image 100). Other: No abdominal wall hernia or abnormality.  No ascites. Musculoskeletal: No acute osseous findings. IMPRESSION: 1. Diffuse interlobular septal thickening, mosaic attenuation of the airspaces, and diffuse bilateral bronchial wall thickening, consistent with pulmonary edema. 2. Cardiomegaly and coronary artery disease. 3. Large cystic lesions of the ovaries, on the right, situated in the right lower quadrant measuring 12.2 x 8.1 x 8.4 cm and arising from the left ovary situated in the posterior pelvis with a visible internal septation measuring 9.0 x 7.9 x  6.6 cm. 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 Recommend gynecologic referral and consideration of further evaluation with pelvic ultrasound and/or contrast enhanced pelvic MRI. 4. Unchanged prominent mediastinal lymph nodes, nonspecific and likely reactive. 5. Heterogeneous nodular thyroid and isthmus. Recommend dedicated thyroid ultrasound on a nonemergent, outpatient basis if and when clinically appropriate. Aortic Atherosclerosis (ICD10-I70.0). Electronically Signed   By: Marolyn JONETTA Jaksch M.D.   On: 11/13/2023 13:41   DG Chest Port 1 View Result Date: 11/13/2023 EXAM: 1 VIEW(S) XRAY OF THE CHEST 11/13/2023 08:47:00 AM COMPARISON: 10/11/2023 CLINICAL HISTORY: Reason for exam: questionable sepsis; SHOB/vaginal bleeding. Upon ems arrival pt tachypnea 22, Afib rvr 120-160. EMS reports edema throughout , weeping edema r leg. FINDINGS: LUNGS AND PLEURA: No focal pulmonary opacity. No pulmonary edema. No pleural effusion. No pneumothorax. HEART AND MEDIASTINUM: Stable cardiomegaly. BONES AND SOFT TISSUES: No acute osseous abnormality. IMPRESSION: 1. Stable cardiomegaly. 2. No acute lung disease. Electronically signed by: Norleen Kil MD 11/13/2023 09:18 AM EDT RP Workstation: [id number]   (Echo, Carotid, EGD, Colonoscopy, ERCP)    Subjective: No complaints.  Discharge Exam: Vitals:   11/27/23 0459 11/27/23 0739  BP: (!) 152/91 (!) 147/82  Pulse: 78 78  Resp: 17   Temp: 98.1 F (36.7 C) 98.3 F (36.8 C)  SpO2: 95% 97%   Vitals:   11/26/23 2028 11/27/23 0019 11/27/23 0459 11/27/23 0739  BP: 126/74 (!) 150/88 (!) 152/91 (!) 147/82  Pulse: 78 66 78 78  Resp: 20 18 17    Temp: 98.5 F (36.9 C) 97.8 F (36.6 C) 98.1 F (36.7 C) 98.3 F (36.8 C)  TempSrc: Axillary Oral Oral Oral  SpO2: 99% 98% 95% 97%  Weight:      Height:        General: Pt is alert, awake, not in acute distress Cardiovascular: RRR, S1/S2 +, no rubs, no gallops Respiratory: CTA bilaterally, no wheezing, no  rhonchi Abdominal: Soft, NT, ND, bowel sounds + Extremities: no edema, no cyanosis    The results of significant diagnostics from this hospitalization (including imaging, microbiology, ancillary and laboratory) are listed below for reference.     Microbiology: Recent Results (from the past 240 hours)  Blood culture (routine x 2)     Status: None   Collection Time: 11/21/23  9:25 AM   Specimen: BLOOD  Result Value Ref Range Status   Specimen Description BLOOD SITE NOT SPECIFIED  Final   Special Requests   Final    BOTTLES DRAWN AEROBIC ONLY Blood Culture results may not be optimal due to an inadequate volume of blood received in culture bottles   Culture   Final    NO GROWTH 5 DAYS Performed at [facility], [address] [place], [place] [address]    Report Status 11/26/2023 FINAL  Final  Resp panel by RT-PCR (RSV, Flu A&B, Covid) Anterior Nasal Swab     Status: None   Collection Time: 11/21/23 10:18 AM   Specimen: Anterior Nasal Swab  Result Value Ref Range Status   SARS Coronavirus  2 by RT PCR NEGATIVE NEGATIVE Final   Influenza A by PCR NEGATIVE NEGATIVE Final   Influenza B by PCR NEGATIVE NEGATIVE Final    Comment: (NOTE) The Xpert Xpress SARS-CoV-2/FLU/RSV plus assay is intended as an aid in the diagnosis of influenza from Nasopharyngeal swab specimens and should not be used as a sole basis for treatment. Nasal washings and aspirates are unacceptable for Xpert Xpress SARS-CoV-2/FLU/RSV testing.  Fact Sheet for Patients: bloggercourse.com  Fact Sheet for Healthcare Providers: seriousbroker.it  This test is not yet approved or cleared by the [place]  FDA and has been authorized for detection and/or diagnosis of SARS-CoV-2 by FDA under an Emergency Use Authorization (EUA). This EUA will remain in effect (meaning this test can be used) for the duration of the COVID-19 declaration under Section 564(b)(1) of  the Act, 21 U.S.C. section 360bbb-3(b)(1), unless the authorization is terminated or revoked.     Resp Syncytial Virus by PCR NEGATIVE NEGATIVE Final    Comment: (NOTE) Fact Sheet for Patients: bloggercourse.com  Fact Sheet for Healthcare Providers: seriousbroker.it  This test is not yet approved or cleared by the [place]  FDA and has been authorized for detection and/or diagnosis of SARS-CoV-2 by FDA under an Emergency Use Authorization (EUA). This EUA will remain in effect (meaning this test can be used) for the duration of the COVID-19 declaration under Section 564(b)(1) of the Act, 21 U.S.C. section 360bbb-3(b)(1), unless the authorization is terminated or revoked.  Performed at [facility], [address], [place], [place] [address]   Blood culture (routine x 2)     Status: None   Collection Time: 11/21/23  5:20 PM   Specimen: BLOOD RIGHT ARM  Result Value Ref Range Status   Specimen Description BLOOD RIGHT ARM  Final   Special Requests   Final    BOTTLES DRAWN AEROBIC AND ANAEROBIC Blood Culture results may not be optimal due to an inadequate volume of blood received in culture bottles   Culture   Final    NO GROWTH 5 DAYS Performed at [facility], [address], [place], [place] [address]    Report Status 11/26/2023 FINAL  Final     Labs: BNP (last 3 results) Recent Labs    05/11/23 0306 11/13/23 0920 11/21/23 1215  BNP 551.0* 915.1* 741.8*   Basic Metabolic Panel: Recent Labs  Lab 11/21/23 0925 11/21/23 1720 11/22/23 0501 11/23/23 0433 11/24/23 0414 11/25/23 0452  NA 137  --  138 140 139 139  K 4.4  --  4.2 3.8 4.2 4.1  CL 106  --  108 109 109 110  CO2 14*  --  18* 21* 19* 18*  GLUCOSE 97  --  146* 98 117* 112*  BUN 23  --  24* 22 20 16   CREATININE 1.33*  --  1.15* 1.00 0.94 0.92  CALCIUM  9.2  --  9.0 8.9 8.8* 9.1  MG  --  1.8  --   --   --   --    Liver Function  Tests: Recent Labs  Lab 11/21/23 0925 11/22/23 0501  AST 30 18  ALT 25 17  ALKPHOS 120 103  BILITOT 1.0 0.7  PROT 5.9* 5.4*  ALBUMIN  2.6* 2.4*   No results for input(s): LIPASE, AMYLASE in the last 168 hours. No results for input(s): AMMONIA in the last 168 hours. CBC: Recent Labs  Lab 11/21/23 0925 11/22/23 0501 11/23/23 0433 11/24/23 0414 11/25/23 0452 11/26/23 0348  WBC 16.7*  13.3* 6.4 6.6 11.0* 12.6*  NEUTROABS 15.9*  --   --   --   --   --   HGB 12.8 11.1* 9.4* 9.8* 10.6* 10.7*  HCT 40.4 35.0* 29.8* 31.0* 33.9* 33.8*  MCV 91.0 91.1 91.4 91.4 92.4 90.1  PLT 203 227 189 232 249 285   Cardiac Enzymes: Recent Labs  Lab 11/21/23 1215  CKTOTAL 38   BNP: Invalid input(s): POCBNP CBG: No results for input(s): GLUCAP in the last 168 hours. D-Dimer No results for input(s): DDIMER in the last 72 hours. Hgb A1c No results for input(s): HGBA1C in the last 72 hours. Lipid Profile No results for input(s): CHOL, HDL, LDLCALC, TRIG, CHOLHDL, LDLDIRECT in the last 72 hours. Thyroid function studies No results for input(s): TSH, T4TOTAL, T3FREE, THYROIDAB in the last 72 hours.  Invalid input(s): FREET3 Anemia work up No results for input(s): VITAMINB12, FOLATE, FERRITIN, TIBC, IRON , RETICCTPCT in the last 72 hours.  Urinalysis    Component Value Date/Time   COLORURINE YELLOW 11/21/2023 1124   APPEARANCEUR CLEAR 11/21/2023 1124   LABSPEC >1.030 (H) 11/21/2023 1124   PHURINE 6.0 11/21/2023 1124   GLUCOSEU NEGATIVE 11/21/2023 1124   HGBUR TRACE (A) 11/21/2023 1124   BILIRUBINUR MODERATE (A) 11/21/2023 1124   KETONESUR NEGATIVE 11/21/2023 1124   PROTEINUR 30 (A) 11/21/2023 1124   NITRITE NEGATIVE 11/21/2023 1124   LEUKOCYTESUR NEGATIVE 11/21/2023 1124   Sepsis Labs Recent Labs  Lab 11/23/23 0433 11/24/23 0414 11/25/23 0452 11/26/23 0348  WBC 6.4 6.6 11.0* 12.6*   Microbiology Recent Results (from the past 240  hours)  Blood culture (routine x 2)     Status: None   Collection Time: 11/21/23  9:25 AM   Specimen: BLOOD  Result Value Ref Range Status   Specimen Description BLOOD SITE NOT SPECIFIED  Final   Special Requests   Final    BOTTLES DRAWN AEROBIC ONLY Blood Culture results may not be optimal due to an inadequate volume of blood received in culture bottles   Culture   Final    NO GROWTH 5 DAYS Performed at [facility], [address], [place], [place] [address]    Report Status 11/26/2023 FINAL  Final  Resp panel by RT-PCR (RSV, Flu A&B, Covid) Anterior Nasal Swab     Status: None   Collection Time: 11/21/23 10:18 AM   Specimen: Anterior Nasal Swab  Result Value Ref Range Status   SARS Coronavirus 2 by RT PCR NEGATIVE NEGATIVE Final   Influenza A by PCR NEGATIVE NEGATIVE Final   Influenza B by PCR NEGATIVE NEGATIVE Final    Comment: (NOTE) The Xpert Xpress SARS-CoV-2/FLU/RSV plus assay is intended as an aid in the diagnosis of influenza from Nasopharyngeal swab specimens and should not be used as a sole basis for treatment. Nasal washings and aspirates are unacceptable for Xpert Xpress SARS-CoV-2/FLU/RSV testing.  Fact Sheet for Patients: bloggercourse.com  Fact Sheet for Healthcare Providers: seriousbroker.it  This test is not yet approved or cleared by the [place]  FDA and has been authorized for detection and/or diagnosis of SARS-CoV-2 by FDA under an Emergency Use Authorization (EUA). This EUA will remain in effect (meaning this test can be used) for the duration of the COVID-19 declaration under Section 564(b)(1) of the Act, 21 U.S.C. section 360bbb-3(b)(1), unless the authorization is terminated or revoked.     Resp Syncytial Virus by PCR NEGATIVE NEGATIVE Final    Comment: (NOTE) Fact Sheet for Patients: bloggercourse.com  Fact Sheet  for Healthcare  Providers: seriousbroker.it  This test is not yet approved or cleared by the [place]  FDA and has been authorized for detection and/or diagnosis of SARS-CoV-2 by FDA under an Emergency Use Authorization (EUA). This EUA will remain in effect (meaning this test can be used) for the duration of the COVID-19 declaration under Section 564(b)(1) of the Act, 21 U.S.C. section 360bbb-3(b)(1), unless the authorization is terminated or revoked.  Performed at [facility], [address] [place], [place] [address]   Blood culture (routine x 2)     Status: None   Collection Time: 11/21/23  5:20 PM   Specimen: BLOOD RIGHT ARM  Result Value Ref Range Status   Specimen Description BLOOD RIGHT ARM  Final   Special Requests   Final    BOTTLES DRAWN AEROBIC AND ANAEROBIC Blood Culture results may not be optimal due to an inadequate volume of blood received in culture bottles   Culture   Final    NO GROWTH 5 DAYS Performed at [facility], [address], [place], [place] [address]    Report Status 11/26/2023 FINAL  Final     Time coordinating discharge: Over 35 minutes  SIGNED:   Erle Odell Castor, MD  [facility] 11/27/2023, 8:19 AM Pager   If 7PM-7AM, please contact night-coverage [email or web address] Password [email or web address]

## 2023-11-27 NOTE — Plan of Care (Signed)

## 2023-11-27 NOTE — Progress Notes (Signed)
 Pt d/c to [facility]. Called to give report to [facility] RN taking care of pt. Pt transported by [facility] around 1215. Pt belongings transported with pt to facility.

## 2023-11-27 NOTE — TOC Transition Note (Signed)
 Transition of Care [facility]) - Discharge Note   Patient Details  Name: Christine Jacobson MRN: [id number] Date of Birth: 25-Nov-1955  [facility] CM/SW Contact:  Isaiah Public, LCSWA Phone Number: 11/27/2023, 10:39 AM   Clinical Narrative:     Patient will DC to: [facility]  Anticipated DC date: 11/27/2023  Family notified: Rock (friend)  Transport by: [facility]  ?  Per MD patient ready for DC to [facility] . RN, patient, patient's family, and facility notified of DC. Discharge Summary sent to facility. RN given number for report [phone] RM# 112. DC packet on chart. Ambulance transport requested for patient.  CSW signing off.   Final next level of care: Skilled Nursing Facility Barriers to Discharge: No Barriers Identified   Patient Goals and CMS Choice Patient states their goals for this hospitalization and ongoing recovery are:: SNF   Choice offered to / list presented to : Patient      Discharge Placement              Patient chooses bed at: [facility] Patient to be transferred to facility by: [facility] Name of family member notified: Rock Patient and family notified of of transfer: 11/27/23  Discharge Plan and Services Additional resources added to the After Visit Summary for                                       Social Drivers of Health (SDOH) Interventions SDOH Screenings   Food Insecurity: No Food Insecurity (11/21/2023)  Housing: Low Risk  (11/21/2023)  Transportation Needs: No Transportation Needs (11/21/2023)  Utilities: Not At Risk (11/21/2023)  Depression (PHQ2-9): Low Risk  (10/26/2021)  Financial Resource Strain: Low Risk  (12/03/2021)  Social Connections: Moderately Isolated (11/21/2023)  Tobacco Use: Medium Risk (11/21/2023)     Readmission Risk Interventions    11/16/2023   10:02 AM 05/12/2023    4:15 PM 12/28/2022   11:28 AM  Readmission Risk Prevention Plan   Transportation Screening Complete Complete Complete  PCP or Specialist Appt within 3-5 Days Complete  Complete  HRI or Home Care Consult Complete  Complete  Social Work Consult for Recovery Care Planning/Counseling Complete  Complete  Palliative Care Screening Not Applicable  Not Applicable  Medication Review [employer])  Complete Complete  HRI or Home Care Consult  Complete   Palliative Care Screening  Not Applicable   Skilled Nursing Facility  Not Applicable

## 2023-12-12 ENCOUNTER — Telehealth: Payer: Self-pay | Admitting: *Deleted

## 2023-12-12 NOTE — Telephone Encounter (Signed)
 Spoke with the patient regarding the referral to GYN oncology. Patient scheduled as new patient with Dr Viktoria on 1/9 at 10:30 am. Patient given an arrival time of 10 am.  Explained to the patient the the doctor will perform a pelvic exam at this visit. Patient given the policy that only one visitor allowed and that visitor must be over 16 yrs are allowed in the Cancer Center. Patient given the address/phone number for the clinic and that the center offers free valet service. Patient aware that masks optional.  Patient offered earlier appts but declined

## 2023-12-13 ENCOUNTER — Other Ambulatory Visit: Payer: Self-pay | Admitting: Cardiology

## 2023-12-19 NOTE — Telephone Encounter (Signed)
 Prescription refill request for Eliquis  received. Indication:afib Last office visit:needs appt Scr: Age:  Weight:  Prescription refilled

## 2024-01-04 ENCOUNTER — Other Ambulatory Visit: Payer: Self-pay

## 2024-01-04 ENCOUNTER — Emergency Department (HOSPITAL_COMMUNITY)

## 2024-01-04 ENCOUNTER — Inpatient Hospital Stay (HOSPITAL_COMMUNITY): Admission: EM | Admit: 2024-01-04 | Discharge: 2024-01-10 | Disposition: A | Discharge status: Home / Self Care

## 2024-01-04 DIAGNOSIS — E875 Hyperkalemia: Secondary | ICD-10-CM | POA: Diagnosis present

## 2024-01-04 DIAGNOSIS — I110 Hypertensive heart disease with heart failure: Secondary | ICD-10-CM | POA: Diagnosis present

## 2024-01-04 DIAGNOSIS — I5033 Acute on chronic diastolic (congestive) heart failure: Secondary | ICD-10-CM | POA: Diagnosis not present

## 2024-01-04 DIAGNOSIS — I421 Obstructive hypertrophic cardiomyopathy: Secondary | ICD-10-CM | POA: Diagnosis present

## 2024-01-04 DIAGNOSIS — L03116 Cellulitis of left lower limb: Secondary | ICD-10-CM | POA: Diagnosis present

## 2024-01-04 DIAGNOSIS — L03115 Cellulitis of right lower limb: Secondary | ICD-10-CM | POA: Diagnosis present

## 2024-01-04 DIAGNOSIS — Z79899 Other long term (current) drug therapy: Secondary | ICD-10-CM | POA: Diagnosis not present

## 2024-01-04 DIAGNOSIS — E785 Hyperlipidemia, unspecified: Secondary | ICD-10-CM | POA: Diagnosis present

## 2024-01-04 DIAGNOSIS — Z87891 Personal history of nicotine dependence: Secondary | ICD-10-CM

## 2024-01-04 DIAGNOSIS — I272 Pulmonary hypertension, unspecified: Secondary | ICD-10-CM | POA: Diagnosis present

## 2024-01-04 DIAGNOSIS — Z66 Do not resuscitate: Secondary | ICD-10-CM | POA: Diagnosis present

## 2024-01-04 DIAGNOSIS — I5030 Unspecified diastolic (congestive) heart failure: Secondary | ICD-10-CM | POA: Diagnosis present

## 2024-01-04 DIAGNOSIS — Z6831 Body mass index (BMI) 31.0-31.9, adult: Secondary | ICD-10-CM | POA: Diagnosis not present

## 2024-01-04 DIAGNOSIS — R748 Abnormal levels of other serum enzymes: Secondary | ICD-10-CM | POA: Diagnosis present

## 2024-01-04 DIAGNOSIS — A419 Sepsis, unspecified organism: Principal | ICD-10-CM | POA: Diagnosis present

## 2024-01-04 DIAGNOSIS — E669 Obesity, unspecified: Secondary | ICD-10-CM | POA: Diagnosis present

## 2024-01-04 DIAGNOSIS — Z91014 Allergy to mammalian meats: Secondary | ICD-10-CM | POA: Diagnosis not present

## 2024-01-04 DIAGNOSIS — R531 Weakness: Secondary | ICD-10-CM | POA: Diagnosis present

## 2024-01-04 DIAGNOSIS — Z833 Family history of diabetes mellitus: Secondary | ICD-10-CM

## 2024-01-04 DIAGNOSIS — N839 Noninflammatory disorder of ovary, fallopian tube and broad ligament, unspecified: Secondary | ICD-10-CM | POA: Diagnosis present

## 2024-01-04 DIAGNOSIS — E872 Acidosis, unspecified: Secondary | ICD-10-CM | POA: Diagnosis present

## 2024-01-04 DIAGNOSIS — Z7901 Long term (current) use of anticoagulants: Secondary | ICD-10-CM | POA: Diagnosis not present

## 2024-01-04 DIAGNOSIS — Z8041 Family history of malignant neoplasm of ovary: Secondary | ICD-10-CM

## 2024-01-04 DIAGNOSIS — Z825 Family history of asthma and other chronic lower respiratory diseases: Secondary | ICD-10-CM

## 2024-01-04 DIAGNOSIS — Z8249 Family history of ischemic heart disease and other diseases of the circulatory system: Secondary | ICD-10-CM

## 2024-01-04 DIAGNOSIS — L0390 Cellulitis, unspecified: Secondary | ICD-10-CM | POA: Diagnosis present

## 2024-01-04 DIAGNOSIS — I4819 Other persistent atrial fibrillation: Secondary | ICD-10-CM | POA: Diagnosis present

## 2024-01-04 DIAGNOSIS — N838 Other noninflammatory disorders of ovary, fallopian tube and broad ligament: Secondary | ICD-10-CM | POA: Diagnosis present

## 2024-01-04 DIAGNOSIS — R54 Age-related physical debility: Secondary | ICD-10-CM | POA: Diagnosis present

## 2024-01-04 DIAGNOSIS — I4891 Unspecified atrial fibrillation: Secondary | ICD-10-CM | POA: Diagnosis present

## 2024-01-04 LAB — CBC
HCT: 38.3 % (ref 36.0–46.0)
Hemoglobin: 11.7 g/dL — ABNORMAL LOW (ref 12.0–15.0)
MCH: 27.6 pg (ref 26.0–34.0)
MCHC: 30.5 g/dL (ref 30.0–36.0)
MCV: 90.3 fL (ref 80.0–100.0)
Platelets: 278 K/uL (ref 150–400)
RBC: 4.24 MIL/uL (ref 3.87–5.11)
RDW: 18.1 % — ABNORMAL HIGH (ref 11.5–15.5)
WBC: 13.4 K/uL — ABNORMAL HIGH (ref 4.0–10.5)
nRBC: 0 % (ref 0.0–0.2)

## 2024-01-04 LAB — COMPREHENSIVE METABOLIC PANEL WITH GFR
ALT: 20 U/L (ref 0–44)
AST: 44 U/L — ABNORMAL HIGH (ref 15–41)
Albumin: 3.7 g/dL (ref 3.5–5.0)
Alkaline Phosphatase: 251 U/L — ABNORMAL HIGH (ref 38–126)
Anion gap: 15 (ref 5–15)
BUN: 21 mg/dL (ref 8–23)
CO2: 16 mmol/L — ABNORMAL LOW (ref 22–32)
Calcium: 10.1 mg/dL (ref 8.9–10.3)
Chloride: 106 mmol/L (ref 98–111)
Creatinine, Ser: 0.9 mg/dL (ref 0.44–1.00)
GFR, Estimated: >60 mL/min (ref >60)
Glucose, Bld: 82 mg/dL (ref 70–99)
Potassium: 5.5 mmol/L — ABNORMAL HIGH (ref 3.5–5.1)
Sodium: 137 mmol/L (ref 135–145)
Total Bilirubin: 1.2 mg/dL (ref 0.0–1.2)
Total Protein: 6.7 g/dL (ref 6.5–8.1)

## 2024-01-04 LAB — URINALYSIS, ROUTINE W REFLEX MICROSCOPIC
Bacteria, UA: NONE SEEN
Bilirubin Urine: NEGATIVE
Glucose, UA: NEGATIVE mg/dL
Hgb urine dipstick: NEGATIVE
Ketones, ur: 5 mg/dL — AB
Nitrite: NEGATIVE
Protein, ur: 100 mg/dL — AB
Specific Gravity, Urine: 1.025 (ref 1.005–1.030)
pH: 5 (ref 5.0–8.0)

## 2024-01-04 LAB — I-STAT CHEM 8, ED
BUN: 24 mg/dL — ABNORMAL HIGH (ref 8–23)
Calcium, Ion: 1.23 mmol/L (ref 1.15–1.40)
Chloride: 111 mmol/L (ref 98–111)
Creatinine, Ser: 0.9 mg/dL (ref 0.44–1.00)
Glucose, Bld: 85 mg/dL (ref 70–99)
HCT: 39 % (ref 36.0–46.0)
Hemoglobin: 13.3 g/dL (ref 12.0–15.0)
Potassium: 5.3 mmol/L — ABNORMAL HIGH (ref 3.5–5.1)
Sodium: 138 mmol/L (ref 135–145)
TCO2: 18 mmol/L — ABNORMAL LOW (ref 22–32)

## 2024-01-04 LAB — I-STAT CG4 LACTIC ACID, ED
Lactic Acid, Venous: 3.6 mmol/L (ref 0.5–1.9)
Lactic Acid, Venous: 2.5 mmol/L (ref 0.5–1.9)

## 2024-01-04 LAB — PROTIME-INR
INR: 1 (ref 0.8–1.2)
Prothrombin Time: 13.9 s (ref 11.4–15.2)

## 2024-01-04 LAB — PRO BRAIN NATRIURETIC PEPTIDE: Pro Brain Natriuretic Peptide: 2651 pg/mL — ABNORMAL HIGH (ref <300.0)

## 2024-01-04 MED ORDER — SODIUM ZIRCONIUM CYCLOSILICATE 10 G PO PACK
10.0000 g | PACK | Freq: Once | ORAL | Status: AC
  Administered 2024-01-04: 10:00:00 10 g via ORAL
  Filled 2024-01-04: qty 1

## 2024-01-04 MED ORDER — SODIUM CHLORIDE 0.9 % IV SOLN
2.0000 g | Freq: Once | INTRAVENOUS | Status: AC
  Administered 2024-01-04: 09:00:00 2 g via INTRAVENOUS
  Filled 2024-01-04: qty 20

## 2024-01-04 MED ORDER — APIXABAN 5 MG PO TABS
5.0000 mg | ORAL_TABLET | Freq: Two times a day (BID) | ORAL | Status: DC
  Administered 2024-01-04 – 2024-01-10 (×13): 5 mg via ORAL
  Filled 2024-01-04 (×13): qty 1

## 2024-01-04 MED ORDER — PANTOPRAZOLE SODIUM 40 MG PO TBEC
40.0000 mg | DELAYED_RELEASE_TABLET | Freq: Every day | ORAL | Status: DC
  Administered 2024-01-04 – 2024-01-10 (×7): 40 mg via ORAL
  Filled 2024-01-04 (×7): qty 1

## 2024-01-04 MED ORDER — METOPROLOL SUCCINATE ER 25 MG PO TB24
25.0000 mg | ORAL_TABLET | Freq: Once | ORAL | Status: AC
  Administered 2024-01-04: 13:00:00 25 mg via ORAL
  Filled 2024-01-04: qty 1

## 2024-01-04 MED ORDER — SODIUM CHLORIDE 0.9 % IV SOLN
1.0000 g | INTRAVENOUS | Status: DC
  Administered 2024-01-05 – 2024-01-07 (×3): 1 g via INTRAVENOUS
  Filled 2024-01-04 (×4): qty 10

## 2024-01-04 MED ORDER — ONDANSETRON HCL 4 MG/2ML IJ SOLN: 4.0000 mg | Freq: Four times a day (QID) | INTRAMUSCULAR | Status: DC | PRN

## 2024-01-04 MED ORDER — ACETAMINOPHEN 325 MG PO TABS
650.0000 mg | ORAL_TABLET | Freq: Four times a day (QID) | ORAL | Status: DC | PRN
  Administered 2024-01-04 – 2024-01-09 (×6): 650 mg via ORAL
  Filled 2024-01-04 (×7): qty 2

## 2024-01-04 MED ORDER — SODIUM CHLORIDE 0.9 % IV BOLUS
1000.0000 mL | Freq: Once | INTRAVENOUS | Status: AC
  Administered 2024-01-04: 09:00:00 1000 mL via INTRAVENOUS

## 2024-01-04 MED ORDER — DIPHENHYDRAMINE HCL 25 MG PO CAPS
25.0000 mg | ORAL_CAPSULE | Freq: Once | ORAL | Status: AC
  Administered 2024-01-04: 16:00:00 25 mg via ORAL
  Filled 2024-01-04: qty 1

## 2024-01-04 MED ORDER — ZINC SULFATE 220 (50 ZN) MG PO CAPS
220.0000 mg | ORAL_CAPSULE | Freq: Every day | ORAL | Status: DC
  Administered 2024-01-04 – 2024-01-10 (×7): 220 mg via ORAL
  Filled 2024-01-04 (×7): qty 1

## 2024-01-04 MED ORDER — FENTANYL CITRATE (PF) 50 MCG/ML IJ SOSY
50.0000 ug | PREFILLED_SYRINGE | Freq: Once | INTRAMUSCULAR | Status: AC
  Administered 2024-01-04: 08:00:00 50 ug via INTRAVENOUS
  Filled 2024-01-04: qty 1

## 2024-01-04 MED ORDER — POLYETHYLENE GLYCOL 3350 17 G PO PACK
17.0000 g | PACK | Freq: Every day | ORAL | Status: DC | PRN
  Administered 2024-01-07: 17 g via ORAL
  Filled 2024-01-04: qty 1

## 2024-01-04 MED ORDER — ONDANSETRON HCL 4 MG/2ML IJ SOLN
4.0000 mg | Freq: Once | INTRAMUSCULAR | Status: AC
  Administered 2024-01-04: 08:00:00 4 mg via INTRAVENOUS
  Filled 2024-01-04: qty 2

## 2024-01-04 MED ORDER — METOPROLOL SUCCINATE ER 50 MG PO TB24
50.0000 mg | ORAL_TABLET | Freq: Every day | ORAL | Status: DC
  Administered 2024-01-05 – 2024-01-10 (×6): 50 mg via ORAL
  Filled 2024-01-04 (×6): qty 1

## 2024-01-04 MED ORDER — METOPROLOL SUCCINATE ER 25 MG PO TB24
25.0000 mg | ORAL_TABLET | Freq: Every day | ORAL | Status: DC
  Administered 2024-01-04: 11:00:00 25 mg via ORAL
  Filled 2024-01-04: qty 1

## 2024-01-04 MED ORDER — ONDANSETRON HCL 4 MG PO TABS
4.0000 mg | ORAL_TABLET | Freq: Four times a day (QID) | ORAL | Status: DC | PRN
  Administered 2024-01-09: 4 mg via ORAL
  Filled 2024-01-04: qty 1

## 2024-01-04 NOTE — Consult Note (Addendum)
 WOC Nurse Consult Note: Reason for Consult: Requested to assess cellulitis on RLE. Last ABI in 2023 - Normal range (1.22) on right.  Left leg 1.32 - non-compressible lower extremities arteries. Wound type: Cellulitis, full thickness. Rash, redness and yellow slough covering the anterior medial malleolar. Pressure Injury POA: NA Measurement: see nursing flowsheet. Wound bed: 100% yellow slough. Drainage (amount, consistency, odor) Minimum amount, serous. Periwound: erythema. Peeling skin, hemosiderin staining. Dressing procedure/placement/frequency: Cleanse with Vashe [id number], allow to dry, not rinse. Apply a single layer of Xeroform to the wound bed, top with non adherent gauze. Moist the intact skin with barrier cream. Wrap with Kerlix gauze and ACE wrap. Starting from the base of the toes to the distal notch of knee, including the heels. Change daily.  May this pt be a good candidate for Foot locker. [place] with the physician to a possible ABI be perform.    WOC team will not plan to follow further. Please reconsult if further assistance is needed. Thank-you,  Lela Holm MSN, RN, CWCN, CNS.  (Phone [phone])

## 2024-01-04 NOTE — Sepsis Progress Note (Signed)
 Sepsis protocol monitored by eLink

## 2024-01-04 NOTE — ED Provider Notes (Signed)
 [place] EMERGENCY DEPARTMENT AT [facility] Provider Note   CSN: [id number] Arrival date & time: 01/04/24  9355     Patient presents with: Fall and Leg Pain  HPI Christine Jacobson is a 68 y.o. female with HFpEF, HOCM, persistent afib on Eliquis , HTN, ovarian mass, recent admission for LLE cellulitis presenting for fall.  Occurred this morning.  Patient cannot remember.  She states she was having difficulty getting off the couch.  Stating she is feeling generally weak and then fell down to the ground landing on her right buttock primarily.  She is endorsing right buttock and lower back pain.  Denies head injury or loss of consciousness.  States she is compliant taking her Eliquis .  Denies chest pain or shortness of breath.  Also reporting cellulitis in the right lower leg.  She is not currently taking antibiotics but states she has been cleaning it daily.   HPI     Prior to Admission medications  Medication Sig Start Date End Date Taking? Authorizing Provider  acetaminophen  (TYLENOL ) 325 MG tablet Take 2 tablets (650 mg total) by mouth every 6 (six) hours as needed for mild pain (pain score 1-3) or fever (or Fever >/= 101). 10/14/23  Yes Sebastian Toribio GAILS, MD  apixaban  (ELIQUIS ) 5 MG TABS tablet Take 1 tablet (5 mg total) by mouth 2 (two) times daily. NEEDS CARDIOLOGY APPT FOR REFILLS, CALL OFFICE [phone].  THANK YOU 12/19/23  Yes Tobb, Kardie, DO  furosemide  (LASIX ) 40 MG tablet Take 1 tablet (40 mg total) by mouth in the morning. 10/14/23  Yes Sebastian Toribio GAILS, MD  hydrocortisone  cream 1 % Apply 1 Application topically 3 (three) times daily as needed for itching (minor skin irritation). 11/27/23  Yes Odell Celinda Balo, MD  metoprolol  succinate (TOPROL -XL) 25 MG 24 hr tablet Take 25 mg by mouth every morning. 12/08/23  Yes [provider]  Potassium Chloride  ER 20 MEQ TBCR Take 1 tablet (20 mEq total) by mouth every other day. 05/31/23  Yes Tobb, Kardie, DO   Zinc  Sulfate 220 (50 Zn) MG TABS Take 1 tablet (220 mg total) by mouth daily for 30 days 06/10/22  Yes [name], MD  allopurinol  (ZYLOPRIM ) 100 MG tablet Take 100 mg by mouth every morning. Patient not taking: Reported on 01/04/2024 12/08/23   [provider]  atorvastatin  (LIPITOR ) 80 MG tablet Take 1 tablet (80 mg total) by mouth daily. Patient not taking: Reported on 11/14/2023 10/14/23   Sebastian Toribio GAILS, MD  ezetimibe  (ZETIA ) 10 MG tablet Take 1 tablet (10 mg total) by mouth daily for cholesterol. Patient not taking: Reported on 01/04/2024 10/30/23     hydrOXYzine  (ATARAX ) 10 MG tablet Take 1 tablet (10 mg total) by mouth 3 (three) times daily as needed for itching. Patient not taking: Reported on 01/04/2024 11/27/23   Odell Celinda Balo, MD  metoprolol  succinate (TOPROL -XL) 50 MG 24 hr tablet Take 1 tablet (50 mg total) by mouth daily. Take with or immediately following a meal. Patient not taking: Reported on 01/04/2024 11/16/23   Gonfa, Taye T, MD  pantoprazole  (PROTONIX ) 40 MG tablet Take 1 tablet (40 mg total) by mouth daily. Patient not taking: Reported on 01/04/2024 10/14/23   Sebastian Toribio GAILS, MD  QUEtiapine  (SEROQUEL ) 25 MG tablet Take 0.5 tablets (12.5 mg total) by mouth at bedtime for 7 days, THEN 1 tablet (25 mg total) at bedtime for mood and insomnia. Patient not taking: Reported on 05/11/2023 04/20/23 07/13/23  Allergies: Porcine (pork) protein-containing drug products    Review of Systems See HPI   Physical Exam   Vitals:   01/04/24 0644 01/04/24 0735  BP: 132/71 (!) 123/93  Pulse: (!) 122 (!) 115  Resp: 20 19  Temp: 98 F (36.7 C) 98.1 F (36.7 C)  SpO2: 96% 100%    CONSTITUTIONAL:  wel-appearing, NAD NEURO:  Alert and oriented x 3, CN 3-12 grossly intact EYES:  eyes equal and reactive ENT/NECK:  Supple, no stridor  CARDIO:  Tachycardia and irregular rhythm, appears well-perfused, 2+ pitting edema in lower extremeties PULM:  No  respiratory distress, CTAB GI/GU:  non-distended, soft, non tender MSK/SPINE:  No gross deformities, no edema, moves all extremities, spinous tenderness, mild erythema about the buttock but no observable skin break down SKIN: erythema, edema and sink breakdown, weeping serosanguinous discharge in RLE see pic below     *Additional and/or pertinent findings included in MDM below    (all labs ordered are listed, but only abnormal results are displayed) Labs Reviewed  COMPREHENSIVE METABOLIC PANEL WITH GFR - Abnormal; Notable for the following components:      Result Value   Potassium 5.5 (*)    CO2 16 (*)    AST 44 (*)    Alkaline Phosphatase 251 (*)    All other components within normal limits  CBC - Abnormal; Notable for the following components:   WBC 13.4 (*)    Hemoglobin 11.7 (*)    RDW 18.1 (*)    All other components within normal limits  PRO BRAIN NATRIURETIC PEPTIDE - Abnormal; Notable for the following components:   Pro Brain Natriuretic Peptide 2,651.0 (*)    All other components within normal limits  I-STAT CHEM 8, ED - Abnormal; Notable for the following components:   Potassium 5.3 (*)    BUN 24 (*)    TCO2 18 (*)    All other components within normal limits  I-STAT CG4 LACTIC ACID, ED - Abnormal; Notable for the following components:   Lactic Acid, Venous 3.6 (*)    All other components within normal limits  CULTURE, BLOOD (ROUTINE X 2)  CULTURE, BLOOD (ROUTINE X 2)  PROTIME-INR  URINALYSIS, ROUTINE W REFLEX MICROSCOPIC    EKG: EKG Interpretation Date/Time:  Thursday January 04 2024 07:33:51 EST Ventricular Rate:  149 PR Interval:    QRS Duration:  90 QT Interval:  303 QTC Calculation: 477 R Axis:   11  Text Interpretation: Atrial fibrillation ST depression, probably rate related afib with RVR Confirmed by Patsey Lot [phone]) on 01/04/2024 7:45:16 AM  Radiology: [facility] Pelvis Portable Result Date: 01/04/2024 CLINICAL DATA:  Trauma EXAM: DG  PORTABLE PELVIS COMPARISON:  None Available. 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 Mild left hip osteoarthritis. Multilevel degenerative disc disease of the spine. Soft tissues are unremarkable. IMPRESSION: No acute fracture, pelvic bone diastasis, or dislocation. Electronically Signed   By: Rogelia Myers M.D.   On: 01/04/2024 08:07   DG Chest Port 1 View Result Date: 01/04/2024 EXAM: 1 VIEW(S) XRAY OF THE CHEST 01/04/2024 07:46:00 AM COMPARISON: 11/21/2023 CLINICAL HISTORY: Trauma FINDINGS: LUNGS AND PLEURA: No focal pulmonary opacity. No pleural effusion. No pneumothorax. HEART AND MEDIASTINUM: Cardiomegaly, unchanged. BONES AND SOFT TISSUES: No acute osseous abnormality. IMPRESSION: 1. No acute process. Electronically signed by: Waddell Calk MD 01/04/2024 08:06 AM EST RP Workstation: [id number]     .Critical Care  Performed by: Lang,  Abbegail Matuska K, PA-C Authorized by: Lang Norleen POUR, PA-C   Critical care provider statement:    Critical care time (minutes):  30   Critical care was necessary to treat or prevent imminent or life-threatening deterioration of the following conditions:  Sepsis (Sepsis with likely source of right lower extremity cellulitis.  Also in A-fib with RVR)   Critical care was time spent personally by me on the following activities:  Development of treatment plan with patient or surrogate, discussions with consultants, evaluation of patient's response to treatment, examination of patient, ordering and review of laboratory studies, ordering and review of radiographic studies, ordering and performing treatments and interventions, pulse oximetry, re-evaluation of patient's condition and review of old charts    Medications Ordered in the ED  metoprolol  succinate (TOPROL -XL) 24 hr tablet 25 mg (has no administration in time range)  fentaNYL  (SUBLIMAZE ) injection  50 mcg (50 mcg Intravenous Given 01/04/24 0823)  ondansetron  (ZOFRAN ) injection 4 mg (4 mg Intravenous Given 01/04/24 0823)  cefTRIAXone  (ROCEPHIN ) 2 g in sodium chloride  0.9 % 100 mL IVPB (0 g Intravenous Stopped 01/04/24 1000)  sodium chloride  0.9 % bolus 1,000 mL (1,000 mLs Intravenous New Bag/Given 01/04/24 0929)  sodium zirconium cyclosilicate  (LOKELMA ) packet 10 g (10 g Oral Given 01/04/24 9040)                                    Medical Decision Making Amount and/or Complexity of Data Reviewed Labs: ordered. Radiology: ordered.  Risk Prescription drug management.   Initial Impression and Ddx 68 year old well-appearing female presenting for general weakness and fall.  Exam notable for irregular tachycardia, cellulitic findings in the right lower leg.  DDx includes sepsis, A-fib with RVR, cellulitis, spinal fracture, cord injury, intra-abdominal trauma, CHF exacerbation, other. Patient PMH that increases complexity of ED encounter:  HFpEF, HOCM, persistent afib on Eliquis , HTN, ovarian mass, recent admission for LLE cellulitis   Interpretation of Diagnostics I independent reviewed and interpreted the labs as followed: WBC 13.4, lactic acid 3.6, potassium 5.5, normal renal function, alk phos 251  - I independently visualized the following imaging with scope of interpretation limited to determining acute life threatening conditions related to emergency care: Chest and pelvic x-rays, which revealed no acute findings  -I personally reviewed interpret EKG which revealed A-fib with RVR  Patient Reassessment and Ultimate Disposition/Management Workup overall suggestive of sepsis with likely etiology right lower extremities cellulitis.  Suspect this is the cause of her persistent tachycardia here today as well.  Ordered blood cultures, IV ceftriaxone  and fluids.  Admitted to hospital service.  Has remained stable on room air.  Patient management required discussion with the following  services or consulting groups:  None  Complexity of Problems Addressed Acute complicated illness or Injury  Additional Data Reviewed and Analyzed Further history obtained from: Past medical history and medications listed in the EMR and Prior ED visit notes  Patient Encounter Risk Assessment Consideration of hospitalization      Final diagnoses:  Sepsis, due to unspecified organism, unspecified whether acute organ dysfunction present [facility])  Cellulitis, unspecified cellulitis site    ED Discharge Orders     None          Marshaun Lortie K, PA-C 01/04/24 1017    Patsey Lot, MD 01/04/24 [phone]

## 2024-01-04 NOTE — ED Triage Notes (Signed)
 Pt arriving with fall from home. Pt was received by night RN who did not triage the patient nor enter in an EMS report. She has swelling and pain to the right leg.

## 2024-01-04 NOTE — ED Notes (Signed)
 Hospitalist aware of patient HR and rhythm.

## 2024-01-04 NOTE — Progress Notes (Signed)
 Dressing placed on RLE as per wound care orders.  Cleansed with vashe, applied xeroform, non adherent gauze, kerlix and ace wrap.

## 2024-01-04 NOTE — H&P (Signed)
 History and Physical    Christine Jacobson FMW:[id number] DOB: [birth year] DOA: 01/04/2024  Referring MD/NP/PA: EDP PCP:  Patient coming from: Home  Chief Complaint: Fall, weakness, leg pain  HPI: Christine Jacobson is a chronically ill obese female with history of diastolic CHF, HOCM, persistent A-fib on Eliquis , ovarian mass, history of recurrent cellulitis presented to the ED with weakness and fall.  Frail at baseline, weaker in the last few days, had pain redness and swelling in her right leg, had some difficulty getting up from the couch and then subsequently fell on the ground, landed on her right buttock, subsequently called EMS and presented to the ED. ED Course: Afebrile, persistent A-fib with RVR, labs noted lactic acidosis, proBNP 2651, but seen 5.5, creatinine 0.9, WBC 13.4  Review of Systems: As per HPI otherwise 14 point review of systems negative.   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   2D ECHO - EF >55%, normal   HTN (hypertension)    Hyperprolactinemia    dx in her 18, took meds, self d/c a while back   Liver function test abnormality    normal when repeated   Mild hyperlipidemia    Obesity    Palpitations    negative stress echo in November 2012 with normal LV function; mild LVH, proximal septal thickening with narrow LVOT and mild gradient; mild MR and TR; Cardionet showed PACs in November 2012   Pyoderma gangrenosa (HCC)    Shortness of breath 07/11/2011   MET TEST    Past Surgical History:  Procedure Laterality Date   BIOPSY  08/10/2022   Procedure: BIOPSY;  Surgeon: Burnette Fallow, MD;  Location: [facility];  Service: Gastroenterology;;   CARDIOVERSION N/A 02/01/2022   Procedure: CARDIOVERSION;  Surgeon: Sheena Pugh, DO;  Location:  [facility];  Service: Cardiovascular;  Laterality: N/A;   CARDIOVERSION N/A 09/20/2022   Procedure: CARDIOVERSION;  Surgeon: Cherrie Toribio SAUNDERS, MD;  Location: [facility];  Service: Cardiovascular;  Laterality: N/A;   COLONOSCOPY WITH PROPOFOL  Left 08/10/2022   Procedure: COLONOSCOPY WITH PROPOFOL ;  Surgeon: Burnette Fallow, MD;  Location: [facility];  Service: Gastroenterology;  Laterality: Left;   ESOPHAGOGASTRODUODENOSCOPY (EGD) WITH PROPOFOL  Left 08/10/2022   Procedure: ESOPHAGOGASTRODUODENOSCOPY (EGD) WITH PROPOFOL ;  Surgeon: Burnette Fallow, MD;  Location: [facility];  Service: Gastroenterology;  Laterality: Left;   POLYPECTOMY  08/10/2022   Procedure: POLYPECTOMY;  Surgeon: Burnette Fallow, MD;  Location: [facility];  Service: Gastroenterology;;   RIGHT HEART CATH N/A 10/15/2021   Procedure: RIGHT HEART CATH;  Surgeon: Elmira Newman PARAS, MD;  Location: [facility];  Service: Cardiovascular;  Laterality: N/A;   RIGHT/LEFT HEART CATH AND CORONARY ANGIOGRAPHY N/A 10/13/2023   Procedure: RIGHT/LEFT HEART CATH AND CORONARY ANGIOGRAPHY;  Surgeon: Rolan Ezra RAMAN, MD;  Location: [facility];  Service: Cardiovascular;  Laterality: N/A;   SKIN GRAFT  2006   porcine, R leg   TEE WITHOUT CARDIOVERSION N/A 09/20/2022   Procedure: TRANSESOPHAGEAL ECHOCARDIOGRAM;  Surgeon: Cherrie Toribio SAUNDERS, MD;  Location: [facility];  Service: Cardiovascular;  Laterality: N/A;     reports that she has quit smoking. Her smoking use included cigarettes. She has a 4 pack-year smoking history. She has never used smokeless tobacco. She reports that she does not  currently use alcohol after a past usage of about 1.0 standard drink of alcohol per week. She reports that she does not use drugs.  Allergies[1]  Family History  Problem Relation Age of Onset   Heart disease Mother        endocarditis   Ovarian cancer Mother    Emphysema Father    Coronary artery disease Other        F in  his 22   Diabetes Other        GP   Cancer Neg Hx      Prior to Admission medications  Medication Sig Start Date End Date Taking? Authorizing Provider  acetaminophen  (TYLENOL ) 325 MG tablet Take 2 tablets (650 mg total) by mouth every 6 (six) hours as needed for mild pain (pain score 1-3) or fever (or Fever >/= 101). 10/14/23  Yes Sebastian Toribio GAILS, MD  apixaban  (ELIQUIS ) 5 MG TABS tablet Take 1 tablet (5 mg total) by mouth 2 (two) times daily. NEEDS CARDIOLOGY APPT FOR REFILLS, CALL OFFICE [phone].  THANK YOU 12/19/23  Yes Tobb, Kardie, DO  furosemide  (LASIX ) 40 MG tablet Take 1 tablet (40 mg total) by mouth in the morning. 10/14/23  Yes Sebastian Toribio GAILS, MD  hydrocortisone  cream 1 % Apply 1 Application topically 3 (three) times daily as needed for itching (minor skin irritation). 11/27/23  Yes Odell Celinda Balo, MD  metoprolol  succinate (TOPROL -XL) 25 MG 24 hr tablet Take 25 mg by mouth every morning. 12/08/23  Yes [provider]  Potassium Chloride  ER 20 MEQ TBCR Take 1 tablet (20 mEq total) by mouth every other day. 05/31/23  Yes Tobb, Kardie, DO  Zinc  Sulfate 220 (50 Zn) MG TABS Take 1 tablet (220 mg total) by mouth daily for 30 days 06/10/22  Yes [name], MD  allopurinol  (ZYLOPRIM ) 100 MG tablet Take 100 mg by mouth every morning. Patient not taking: Reported on 01/04/2024 12/08/23   [provider]  atorvastatin  (LIPITOR ) 80 MG tablet Take 1 tablet (80 mg total) by mouth daily. Patient not taking: Reported on 11/14/2023 10/14/23   Sebastian Toribio GAILS, MD  ezetimibe  (ZETIA ) 10 MG tablet Take 1 tablet (10 mg total) by mouth daily for cholesterol. Patient not taking: Reported on 01/04/2024 10/30/23     hydrOXYzine  (ATARAX ) 10 MG tablet Take 1 tablet (10 mg total) by mouth 3 (three) times daily as needed for itching. Patient not taking: Reported on 01/04/2024 11/27/23   Odell Celinda Balo, MD  metoprolol  succinate (TOPROL -XL) 50 MG 24 hr tablet Take 1  tablet (50 mg total) by mouth daily. Take with or immediately following a meal. Patient not taking: Reported on 01/04/2024 11/16/23   Gonfa, Taye T, MD  pantoprazole  (PROTONIX ) 40 MG tablet Take 1 tablet (40 mg total) by mouth daily. Patient not taking: Reported on 01/04/2024 10/14/23   Sebastian Toribio GAILS, MD  QUEtiapine  (SEROQUEL ) 25 MG tablet Take 0.5 tablets (12.5 mg total) by mouth at bedtime for 7 days, THEN 1 tablet (25 mg total) at bedtime for mood and insomnia. Patient not taking: Reported on 05/11/2023 04/20/23 07/13/23      Physical Exam: Vitals:   01/04/24 0644 01/04/24 0735 01/04/24 1100  BP: 132/71 (!) 123/93 (!) 129/90  Pulse: (!) 122 (!) 115 (!) 147  Resp: 20 19 20   Temp: 98 F (36.7 C) 98.1 F (36.7 C)   TempSrc: Oral Oral   SpO2: 96% 100% 100%      Constitutional: Obese, chronically ill, AAO  x 3  HEENT: Neck obese unable to assess JVD Respiratory: clear to auscultation bilaterally Cardiovascular: S1S2/RRR Abdomen: soft, non tender, Bowel sounds positive.  Ext: Redness erythema tenderness and skin breakdown of right lower extremity Skin: Moisture associated skin damage to perineal and sacral skin Neurologic: Moves all extremities, no localizing signs Psychiatric: Angry, irritable  Labs on Admission: I have personally reviewed following labs and imaging studies  CBC: Recent Labs  Lab 01/04/24 0832 01/04/24 0850  WBC  --  13.4*  HGB 13.3 11.7*  HCT 39.0 38.3  MCV  --  90.3  PLT  --  278   Basic Metabolic Panel: Recent Labs  Lab 01/04/24 0832 01/04/24 0850  NA 138 137  K 5.3* 5.5*  CL 111 106  CO2  --  16*  GLUCOSE 85 82  BUN 24* 21  CREATININE 0.90 0.90  CALCIUM   --  10.1   GFR: CrCl cannot be calculated (Unknown ideal weight.). Liver Function Tests: Recent Labs  Lab 01/04/24 0850  AST 44*  ALT 20  ALKPHOS 251*  BILITOT 1.2  PROT 6.7  ALBUMIN  3.7   No results for input(s): LIPASE, AMYLASE in the last 168 hours. No results for  input(s): AMMONIA in the last 168 hours. Coagulation Profile: Recent Labs  Lab 01/04/24 0850  INR 1.0   Cardiac Enzymes: No results for input(s): CKTOTAL, CKMB, CKMBINDEX, TROPONINI in the last 168 hours. BNP (last 3 results) Recent Labs    10/11/23 0935 10/12/23 0458 01/04/24 0850  PROBNP 4,647.0* 4,823.0* 2,651.0*   HbA1C: No results for input(s): HGBA1C in the last 72 hours. CBG: No results for input(s): GLUCAP in the last 168 hours. Lipid Profile: No results for input(s): CHOL, HDL, LDLCALC, TRIG, CHOLHDL, LDLDIRECT in the last 72 hours. Thyroid Function Tests: No results for input(s): TSH, T4TOTAL, FREET4, T3FREE, THYROIDAB in the last 72 hours. Anemia Panel: No results for input(s): VITAMINB12, FOLATE, FERRITIN, TIBC, IRON , RETICCTPCT in the last 72 hours. Urine analysis:    Component Value Date/Time   COLORURINE YELLOW 01/04/2024 0945   APPEARANCEUR CLEAR 01/04/2024 0945   LABSPEC 1.025 01/04/2024 0945   PHURINE 5.0 01/04/2024 0945   GLUCOSEU NEGATIVE 01/04/2024 0945   HGBUR NEGATIVE 01/04/2024 0945   BILIRUBINUR NEGATIVE 01/04/2024 0945   KETONESUR 5 (A) 01/04/2024 0945   PROTEINUR 100 (A) 01/04/2024 0945   NITRITE NEGATIVE 01/04/2024 0945   LEUKOCYTESUR TRACE (A) 01/04/2024 0945   Sepsis Labs: @LABRCNTIP (procalcitonin:4,lacticidven:4) )No results found for this or any previous visit (from the past 240 hours).   Radiological Exams on Admission: DG Pelvis Portable Result Date: 01/04/2024 CLINICAL DATA:  Trauma EXAM: DG PORTABLE PELVIS COMPARISON:  None Available. 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 Mild left hip osteoarthritis. Multilevel degenerative disc disease of the spine. Soft tissues are unremarkable. IMPRESSION: No acute fracture, pelvic bone diastasis, or dislocation. Electronically  Signed   By: Rogelia Myers M.D.   On: 01/04/2024 08:07   DG Chest Port 1 View Result Date: 01/04/2024 EXAM: 1 VIEW(S) XRAY OF THE CHEST 01/04/2024 07:46:00 AM COMPARISON: 11/21/2023 CLINICAL HISTORY: Trauma FINDINGS: LUNGS AND PLEURA: No focal pulmonary opacity. No pleural effusion. No pneumothorax. HEART AND MEDIASTINUM: Cardiomegaly, unchanged. BONES AND SOFT TISSUES: No acute osseous abnormality. IMPRESSION: 1. No acute process. Electronically signed by: Waddell Calk MD 01/04/2024 08:06 AM EST RP Workstation: [id number]    EKG: Independently reviewed.  Atrial fibrillation with RVR  Assessment/Plan  Sepsis,  POA Right lower extremity cellulitis - Continue IV ceftriaxone  - Follow-up blood cultures - Wound consult  Chronic diastolic CHF Pulmonary hypertension HOCM - Overtly appears euvolemic, fairly obese, volume status difficult to assess - Holding Lasix  today in the setting of sepsis, resume soon - Continue metoprolol   Persistent atrial fibrillation with RVR -Heart rate higher at this time, likely driven by infectious process, -Continue Toprol , dose increased to 50 mg, continue Eliquis    Ovarian mass -Needs GYN follow-up   DVT prophylaxis: apixaban  Code Status: DNR per patient's wishes Family Communication: No family at bedside Disposition Plan: None present Consults called: None Admission status: Inpatient  Sigurd Pac MD [facility]   01/04/2024, 11:30 AM       [1]  Allergies Allergen Reactions   Porcine (Pork) Protein-Containing Drug Products Other (See Comments)    Religious preference

## 2024-01-04 NOTE — ED Notes (Signed)
 EDP aware of patient HR and rhythm.

## 2024-01-04 NOTE — ED Notes (Signed)
 Lang PA aware that patient IV infiltrated and IV team consult placed - Will delay repeat lactic. Pt is a difficult stick, multiple attempts.

## 2024-01-04 NOTE — Progress Notes (Signed)
° °  Brief Progress Note   _____________________________________________________________________________________________________________  Patient Name: Christine Jacobson Patient DOB: 10-Aug-1955 Date: @TODAY @      Data: Reviewed vital signs, labs, and clinical notes.    Action: No action required at this time.    Response:  Tachycardia HR in the 120's-140's.  _____________________________________________________________________________________________________________  [facility] Clete Kuch S Meghna Hagmann Please contact us  directly via secure chat (search for [facility]) or by calling us  at [phone] [facility]).

## 2024-01-04 NOTE — Plan of Care (Signed)

## 2024-01-05 DIAGNOSIS — L03115 Cellulitis of right lower limb: Secondary | ICD-10-CM | POA: Diagnosis not present

## 2024-01-05 LAB — CBC
HCT: 35.4 % — ABNORMAL LOW (ref 36.0–46.0)
Hemoglobin: 10.9 g/dL — ABNORMAL LOW (ref 12.0–15.0)
MCH: 27.8 pg (ref 26.0–34.0)
MCHC: 30.8 g/dL (ref 30.0–36.0)
MCV: 90.3 fL (ref 80.0–100.0)
Platelets: 284 K/uL (ref 150–400)
RBC: 3.92 MIL/uL (ref 3.87–5.11)
RDW: 18.6 % — ABNORMAL HIGH (ref 11.5–15.5)
WBC: 7.7 K/uL (ref 4.0–10.5)
nRBC: 0.3 % — ABNORMAL HIGH (ref 0.0–0.2)

## 2024-01-05 LAB — COMPREHENSIVE METABOLIC PANEL WITH GFR
ALT: 14 U/L (ref 0–44)
AST: 30 U/L (ref 15–41)
Albumin: 3.3 g/dL — ABNORMAL LOW (ref 3.5–5.0)
Alkaline Phosphatase: 204 U/L — ABNORMAL HIGH (ref 38–126)
Anion gap: 9 (ref 5–15)
BUN: 23 mg/dL (ref 8–23)
CO2: 20 mmol/L — ABNORMAL LOW (ref 22–32)
Calcium: 9.8 mg/dL (ref 8.9–10.3)
Chloride: 108 mmol/L (ref 98–111)
Creatinine, Ser: 1.1 mg/dL — ABNORMAL HIGH (ref 0.44–1.00)
GFR, Estimated: 54 mL/min — ABNORMAL LOW
Glucose, Bld: 90 mg/dL (ref 70–99)
Potassium: 4.8 mmol/L (ref 3.5–5.1)
Sodium: 137 mmol/L (ref 135–145)
Total Bilirubin: 0.5 mg/dL (ref 0.0–1.2)
Total Protein: 5.8 g/dL — ABNORMAL LOW (ref 6.5–8.1)

## 2024-01-05 MED ORDER — DIPHENHYDRAMINE HCL 25 MG PO CAPS
25.0000 mg | ORAL_CAPSULE | Freq: Once | ORAL | Status: AC
  Administered 2024-01-05: 25 mg via ORAL
  Filled 2024-01-05: qty 1

## 2024-01-05 MED ORDER — FUROSEMIDE 10 MG/ML IJ SOLN
40.0000 mg | Freq: Two times a day (BID) | INTRAMUSCULAR | Status: DC
  Administered 2024-01-05 – 2024-01-07 (×5): 40 mg via INTRAVENOUS
  Filled 2024-01-05 (×5): qty 4

## 2024-01-05 NOTE — Progress Notes (Signed)
 " PROGRESS NOTE    Christine Jacobson  FMW:[id number] DOB: [birth year] DOA: 01/04/2024 PCP: [name], NP  Christine Jacobson is a chronically ill obese female with history of diastolic CHF, HOCM, persistent A-fib on Eliquis , ovarian mass, history of recurrent cellulitis presented to the ED with weakness and fall.  Frail at baseline, weaker in the last few days, had pain redness and swelling in her right leg, had some difficulty getting up from the couch and then subsequently fell on the ground, landed on her right buttock, subsequently called EMS and presented to the ED. ED Course: Afebrile, persistent A-fib with RVR, labs noted lactic acidosis, proBNP 2651, potassium 5.5, creatinine 0.9, WBC 13.4   Subjective: - Feels better today, has a large dressing on right lower leg  Assessment and Plan:  Sepsis, POA Right lower extremity cellulitis - Continue IV ceftriaxone  - Cultures negative thus far - Wound consult, continue wound care   Chronic diastolic CHF Pulmonary hypertension HOCM - Edematous, fairly obese, volume status difficult to assess - Start IV Lasix  today - Continue metoprolol   Hyperkalemia Resolved   Persistent atrial fibrillation with RVR -Heart rate higher at this time, likely driven by infectious process, -Continue Toprol , dose increased to 50 mg, continue Eliquis    Ovarian mass -Needs GYN follow-up  Elevated alkaline phosphatase - Needs follow-up   DVT prophylaxis: Apixaban  Code Status: DNR Family Communication: None present Disposition Plan: Home likely 1 to 2 days  Consultants:    Procedures:   Antimicrobials:    Objective: Vitals:   01/04/24 1452 01/04/24 1930 01/05/24 0357 01/05/24 0724  BP: 120/83 118/80 137/82 114/86  Pulse: (!) 106 90 90 94  Resp: 20 16 18 16   Temp: 98 F (36.7 C) 98.3 F (36.8 C) 98 F (36.7 C) 97.8 F (36.6 C)  TempSrc: Oral Oral Oral   SpO2: 99% 96% 100% 97%    Intake/Output Summary (Last 24 hours) at  01/05/2024 1146 Last data filed at 01/05/2024 0700 Gross per 24 hour  Intake 120 ml  Output --  Net 120 ml   There were no vitals filed for this visit.  Examination:  General exam: Appears calm and comfortable, AO x 3, no distress Respiratory system: Clear to auscultation Cardiovascular system: S1 & S2 heard, irregular Abd: nondistended, soft and nontender.Normal bowel sounds heard. Central nervous system: Alert and oriented. No focal neurological deficits. Extremities: 1+ edema, right lower leg with dressing Skin: No rashes Psychiatry:  Mood & affect appropriate.     Data Reviewed:   CBC: Recent Labs  Lab 01/04/24 0832 01/04/24 0850 01/05/24 0416  WBC  --  13.4* 7.7  HGB 13.3 11.7* 10.9*  HCT 39.0 38.3 35.4*  MCV  --  90.3 90.3  PLT  --  278 284   Basic Metabolic Panel: Recent Labs  Lab 01/04/24 0832 01/04/24 0850 01/05/24 0416  NA 138 137 137  K 5.3* 5.5* 4.8  CL 111 106 108  CO2  --  16* 20*  GLUCOSE 85 82 90  BUN 24* 21 23  CREATININE 0.90 0.90 1.10*  CALCIUM   --  10.1 9.8   GFR: CrCl cannot be calculated (Unknown ideal weight.). Liver Function Tests: Recent Labs  Lab 01/04/24 0850 01/05/24 0416  AST 44* 30  ALT 20 14  ALKPHOS 251* 204*  BILITOT 1.2 0.5  PROT 6.7 5.8*  ALBUMIN  3.7 3.3*   No results for input(s): LIPASE, AMYLASE in the last 168 hours. No results for input(s): AMMONIA in  the last 168 hours. Coagulation Profile: Recent Labs  Lab 01/04/24 0850  INR 1.0   Cardiac Enzymes: No results for input(s): CKTOTAL, CKMB, CKMBINDEX, TROPONINI in the last 168 hours. BNP (last 3 results) Recent Labs    10/11/23 0935 10/12/23 0458 01/04/24 0850  PROBNP 4,647.0* 4,823.0* 2,651.0*   HbA1C: No results for input(s): HGBA1C in the last 72 hours. CBG: No results for input(s): GLUCAP in the last 168 hours. Lipid Profile: No results for input(s): CHOL, HDL, LDLCALC, TRIG, CHOLHDL, LDLDIRECT in the last 72  hours. Thyroid Function Tests: No results for input(s): TSH, T4TOTAL, FREET4, T3FREE, THYROIDAB in the last 72 hours. Anemia Panel: No results for input(s): VITAMINB12, FOLATE, FERRITIN, TIBC, IRON , RETICCTPCT in the last 72 hours. Urine analysis:    Component Value Date/Time   COLORURINE YELLOW 01/04/2024 0945   APPEARANCEUR CLEAR 01/04/2024 0945   LABSPEC 1.025 01/04/2024 0945   PHURINE 5.0 01/04/2024 0945   GLUCOSEU NEGATIVE 01/04/2024 0945   HGBUR NEGATIVE 01/04/2024 0945   BILIRUBINUR NEGATIVE 01/04/2024 0945   KETONESUR 5 (A) 01/04/2024 0945   PROTEINUR 100 (A) 01/04/2024 0945   NITRITE NEGATIVE 01/04/2024 0945   LEUKOCYTESUR TRACE (A) 01/04/2024 0945   Sepsis Labs: @LABRCNTIP (procalcitonin:4,lacticidven:4)  ) Recent Results (from the past 240 hours)  Culture, blood (routine x 2)     Status: None (Preliminary result)   Collection Time: 01/04/24  8:37 AM   Specimen: BLOOD LEFT ARM  Result Value Ref Range Status   Specimen Description BLOOD LEFT ARM  Final   Special Requests   Final    AEROBIC BOTTLE ONLY Blood Culture results may not be optimal due to an inadequate volume of blood received in culture bottles   Culture   Final    NO GROWTH < 24 HOURS Performed at [facility], [address] [place], [place] [address]    Report Status PENDING  Incomplete  Culture, blood (routine x 2)     Status: None (Preliminary result)   Collection Time: 01/04/24  8:42 AM   Specimen: BLOOD RIGHT ARM  Result Value Ref Range Status   Specimen Description BLOOD RIGHT ARM  Final   Special Requests   Final    BOTTLES DRAWN AEROBIC AND ANAEROBIC Blood Culture results may not be optimal due to an inadequate volume of blood received in culture bottles   Culture   Final    NO GROWTH < 24 HOURS Performed at [facility], [address], [place], [place] [address]    Report Status PENDING  Incomplete     Radiology Studies: DG Pelvis Portable Result  Date: 01/04/2024 CLINICAL DATA:  Trauma EXAM: DG PORTABLE PELVIS COMPARISON:  None Available. 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 Mild left hip osteoarthritis. Multilevel degenerative disc disease of the spine. Soft tissues are unremarkable. IMPRESSION: No acute fracture, pelvic bone diastasis, or dislocation. Electronically Signed   By: Rogelia Myers M.D.   On: 01/04/2024 08:07   DG Chest Port 1 View Result Date: 01/04/2024 EXAM: 1 VIEW(S) XRAY OF THE CHEST 01/04/2024 07:46:00 AM COMPARISON: 11/21/2023 CLINICAL HISTORY: Trauma FINDINGS: LUNGS AND PLEURA: No focal pulmonary opacity. No pleural effusion. No pneumothorax. HEART AND MEDIASTINUM: Cardiomegaly, unchanged. BONES AND SOFT TISSUES: No acute osseous abnormality. IMPRESSION: 1. No acute process. Electronically signed by: Waddell Calk MD 01/04/2024 08:06 AM EST RP Workstation: [id number]     Scheduled Meds:  apixaban   5  mg Oral BID   furosemide   40 mg Intravenous BID   metoprolol  succinate  50 mg Oral Daily   pantoprazole   40 mg Oral Daily   zinc  sulfate (50mg  elemental zinc )  220 mg Oral Daily   Continuous Infusions:  cefTRIAXone  (ROCEPHIN )  IV       LOS: 1 day    Time spent:    Sigurd Pac, MD [facility]   01/05/2024, 11:46 AM    "

## 2024-01-05 NOTE — Evaluation (Signed)
 Physical Therapy Evaluation Patient Details Name: Christine Jacobson MRN: [id number] DOB: 15-Jan-1956 Today's Date: 01/05/2024  History of Present Illness  Pt is a 68 y.o female admitted 12/18 for fall at home. Pain in RLE, imaging negative for fx.  PMH: obese, CHF, afib, ovarian mass, chronic cellulitis.  Clinical Impression  Pt is currently presenting at Min A for bed mobility, sit to stand and CGA to Min A for gait due to poor balance and LE strength. Pt demonstrates a shuffling gait with drifting and heavy reliance on UE for balance. Pt gets irritable with assistance or questions; difficult to ascertain whether pt is just irritable with therapist or trying to cover up for memory loss; deflecting when asked questions about home/mobility. Would benefit from cognition evaluation; OT is ordered. Due to pt current functional status, home set up and available assistance at home recommending skilled physical therapy services < 3 hours/day in order to address strength, balance and functional mobility to decrease risk for falls, injury, immobility, skin break down and re-hospitalization.        If plan is discharge home, recommend the following: Assist for transportation;Assistance with cooking/housework   Can travel by private vehicle   No    Equipment Recommendations None recommended by PT     Functional Status Assessment Patient has had a recent decline in their functional status and demonstrates the ability to make significant improvements in function in a reasonable and predictable amount of time.     Precautions / Restrictions Precautions Precautions: Fall Recall of Precautions/Restrictions: Impaired Restrictions Weight Bearing Restrictions Per Provider Order: No      Mobility  Bed Mobility Overal bed mobility: Needs Assistance Bed Mobility: Supine to Sit, Sit to Supine     Supine to sit: Min assist Sit to supine: Min assist   General bed mobility comments: Pt requires Min A  for LE mobility to EOB for supine<>sitting. Gets agitated with assist.    Transfers Overall transfer level: Needs assistance Equipment used: Rolling walker (2 wheels) Transfers: Sit to/from Stand Sit to Stand: Min assist, Contact guard assist           General transfer comment: sit to stand 2x from EOB. Pt requires Min A with narrow BOS and cues for safe hand placement. Pt gets agitated with assist. with heavy sighs and stating i know how to move    Ambulation/Gait Ambulation/Gait assistance: Contact guard assist, Min assist Gait Distance (Feet): 40 Feet Assistive device: Rolling walker (2 wheels) Gait Pattern/deviations: Narrow base of support, Step-to pattern, Shuffle Gait velocity: decreased Gait velocity interpretation: <1.31 ft/sec, indicative of household ambulator   General Gait Details: very narrow BOS with shuffling for steps; pt does not clear the floor with either foot. Requires UE support on RW and MIn A for balance though pt very irritable when offerint asisstance for safety     Balance Overall balance assessment: Needs assistance Sitting-balance support: Single extremity supported, Feet supported Sitting balance-Leahy Scale: Good     Standing balance support: Bilateral upper extremity supported, During functional activity, Reliant on assistive device for balance Standing balance-Leahy Scale: Poor Standing balance comment: up to Min A for balance.       Pertinent Vitals/Pain Pain Assessment Pain Assessment: Faces Faces Pain Scale: Hurts little more Pain Location: bil LE with movement/touch Pain Descriptors / Indicators: Discomfort, Grimacing Pain Intervention(s): Monitored during session, Repositioned    Home Living Family/patient expects to be discharged to:: Private residence Living Arrangements: Alone Available Help at Discharge: Friend(s);Available  PRN/intermittently Type of Home: Apartment Home Access: Stairs to enter Entrance Stairs-Rails:  Left Entrance Stairs-Number of Steps: 6   Home Layout: One level Home Equipment: Cane - single Librarian, Academic (2 wheels) Additional Comments: Home set up achieved from previous admission and patient    Prior Function Prior Level of Function : Independent/Modified Independent             Mobility Comments: uses SPC, doesn't drive, has groceries delivered ADLs Comments: bathes at sink, eats in room due to [employer]     Extremity/Trunk Assessment   Upper Extremity Assessment Upper Extremity Assessment: Generalized weakness;Defer to OT evaluation    Lower Extremity Assessment Lower Extremity Assessment: Generalized weakness    Cervical / Trunk Assessment Cervical / Trunk Assessment: Normal  Communication   Communication Communication: Impaired Factors Affecting Communication: Difficulty expressing self    Cognition Arousal: Alert Behavior During Therapy: Flat affect, Agitated   PT - Cognitive impairments: No family/caregiver present to determine baseline, Difficult to assess, Safety/Judgement, Problem solving, Sequencing, Initiation, Attention Difficult to assess due to:  (pt not answering questions)     Following commands: Impaired Following commands impaired: Follows one step commands inconsistently, Follows one step commands with increased time     Cueing Cueing Techniques: Verbal cues, Tactile cues, Visual cues            Assessment/Plan    PT Assessment Patient needs continued PT services  PT Problem List Decreased strength;Decreased activity tolerance;Decreased balance;Decreased mobility;Decreased safety awareness;Pain       PT Treatment Interventions DME instruction;Balance training;Gait training;Neuromuscular re-education;Stair training;Functional mobility training;Patient/family education;Therapeutic activities;Therapeutic exercise    PT Goals (Current goals can be found in the Care Plan section)  Acute Rehab PT Goals PT Goal  Formulation: Patient unable to participate in goal setting Time For Goal Achievement: 01/19/24 Potential to Achieve Goals: Fair    Frequency Min 2X/week        AM-PAC PT 6 Clicks Mobility  Outcome Measure Help needed turning from your back to your side while in a flat bed without using bedrails?: A Little Help needed moving from lying on your back to sitting on the side of a flat bed without using bedrails?: A Little Help needed moving to and from a bed to a chair (including a wheelchair)?: A Little Help needed standing up from a chair using your arms (e.g., wheelchair or bedside chair)?: A Little Help needed to walk in hospital room?: A Little Help needed climbing 3-5 steps with a railing? : A Lot 6 Click Score: 17    End of Session Equipment Utilized During Treatment: Gait belt Activity Tolerance: Other (comment) (pt intermittently irritable and agitated throughout session; does not want assistance even when needed.) Patient left: in bed;with call bell/phone within reach;with bed alarm set Nurse Communication: Mobility status PT Visit Diagnosis: Unsteadiness on feet (R26.81);Other abnormalities of gait and mobility (R26.89);Muscle weakness (generalized) (M62.81);Pain Pain - Right/Left:  (bil) Pain - part of body: Leg    Time: 8751-8683 PT Time Calculation (min) (ACUTE ONLY): 28 min   Charges:   PT Evaluation $PT Eval Low Complexity: 1 Low PT Treatments $Therapeutic Activity: 8-22 mins PT General Charges $$ ACUTE PT VISIT: 1 Visit        Dorothyann Maier, DPT, CLT  Acute Rehabilitation Services Office: [phone] (Secure chat preferred)   Dorothyann VEAR Maier 01/05/2024, 5:05 PM

## 2024-01-05 NOTE — Plan of Care (Signed)

## 2024-01-06 DIAGNOSIS — L03115 Cellulitis of right lower limb: Secondary | ICD-10-CM | POA: Diagnosis not present

## 2024-01-06 LAB — BASIC METABOLIC PANEL WITH GFR
Anion gap: 8 (ref 5–15)
BUN: 24 mg/dL — ABNORMAL HIGH (ref 8–23)
CO2: 27 mmol/L (ref 22–32)
Calcium: 9.2 mg/dL (ref 8.9–10.3)
Chloride: 105 mmol/L (ref 98–111)
Creatinine, Ser: 1.21 mg/dL — ABNORMAL HIGH (ref 0.44–1.00)
GFR, Estimated: 48 mL/min — ABNORMAL LOW
Glucose, Bld: 89 mg/dL (ref 70–99)
Potassium: 3.6 mmol/L (ref 3.5–5.1)
Sodium: 140 mmol/L (ref 135–145)

## 2024-01-06 LAB — CBC
HCT: 35.8 % — ABNORMAL LOW (ref 36.0–46.0)
Hemoglobin: 11.1 g/dL — ABNORMAL LOW (ref 12.0–15.0)
MCH: 27.6 pg (ref 26.0–34.0)
MCHC: 31 g/dL (ref 30.0–36.0)
MCV: 89.1 fL (ref 80.0–100.0)
Platelets: 284 K/uL (ref 150–400)
RBC: 4.02 MIL/uL (ref 3.87–5.11)
RDW: 18.6 % — ABNORMAL HIGH (ref 11.5–15.5)
WBC: 6.6 K/uL (ref 4.0–10.5)
nRBC: 0 % (ref 0.0–0.2)

## 2024-01-06 MED ORDER — POTASSIUM CHLORIDE CRYS ER 20 MEQ PO TBCR
40.0000 meq | EXTENDED_RELEASE_TABLET | Freq: Two times a day (BID) | ORAL | Status: AC
  Administered 2024-01-06 (×2): 40 meq via ORAL
  Filled 2024-01-06 (×2): qty 2

## 2024-01-06 NOTE — Evaluation (Signed)
 Occupational Therapy Evaluation Patient Details Name: Christine Jacobson MRN: [id number] DOB: Oct 08, 1955 Today's Date: 01/06/2024   History of Present Illness   Pt is a 68 y.o female admitted 12/18 for fall at home. Pain in RLE, imaging negative for fx.  PMH: obese, CHF, afib, ovarian mass, chronic cellulitis.     Clinical Impressions PTA Pt reports Mod I with SPC for functional mobility and independent with ADL tasks. Pt currently requires CGA for functional transfers and up to Max A for ADL tasks. Pt demonstrates executive functioning deficits, limiting safe independent engagement in functional tasks. Pt primarily limited by decreased activity tolerance, pain, generalized weakness, impaired cognition, and unsteadiness on feet. OT to continue to follow Pt acutely to facilitate progress towards goals. Patient will benefit from continued inpatient follow up therapy, <3 hours/day.      If plan is discharge home, recommend the following:   A little help with walking and/or transfers;A lot of help with bathing/dressing/bathroom;Assistance with cooking/housework;Direct supervision/assist for medications management;Assist for transportation;Help with stairs or ramp for entrance     Functional Status Assessment   Patient has had a recent decline in their functional status and demonstrates the ability to make significant improvements in function in a reasonable and predictable amount of time.     Equipment Recommendations   Other (comment) (defer to next venue)     Recommendations for Other Services         Precautions/Restrictions   Precautions Precautions: Fall Recall of Precautions/Restrictions: Impaired Restrictions Weight Bearing Restrictions Per Provider Order: No     Mobility Bed Mobility Overal bed mobility: Needs Assistance Bed Mobility: Supine to Sit, Sit to Supine     Supine to sit: Contact guard Sit to supine: Contact guard assist   General bed mobility  comments: CGA for safety and increased time needed for bed mobility. No physical assit required    Transfers Overall transfer level: Needs assistance Equipment used: Rolling walker (2 wheels) Transfers: Sit to/from Stand Sit to Stand: Contact guard assist           General transfer comment: CGA for safety, Pt requested OT does not assist with mobility. Pt noted to demonstrate effortful movements when taking side steps to the R. Pt required verbal cues for sequencing task.      Balance Overall balance assessment: Needs assistance Sitting-balance support: Bilateral upper extremity supported, Feet supported Sitting balance-Leahy Scale: Good     Standing balance support: Bilateral upper extremity supported, During functional activity, Reliant on assistive device for balance Standing balance-Leahy Scale: Poor Standing balance comment: Dependent on RW                           ADL either performed or assessed with clinical judgement   ADL Overall ADL's : Needs assistance/impaired Eating/Feeding: Independent   Grooming: Contact guard assist;Standing   Upper Body Bathing: Contact guard assist;Sitting   Lower Body Bathing: Maximal assistance   Upper Body Dressing : Supervision/safety;Sitting   Lower Body Dressing: Maximal assistance   Toilet Transfer: Contact guard assist;Ambulation;Rolling walker (2 wheels);Comfort height toilet   Toileting- Clothing Manipulation and Hygiene: Minimal assistance               Vision Patient Visual Report: No change from baseline;Blurring of vision Additional Comments: Pt reports blurry vision at baseline     Perception         Praxis         Pertinent Vitals/Pain Pain  Assessment Pain Assessment: 0-10 Pain Score: 7  Pain Location: RLE Pain Descriptors / Indicators: Grimacing, Guarding, Moaning, Sharp, Tender Pain Intervention(s): Limited activity within patient's tolerance, Monitored during session, Repositioned,  Patient requesting pain meds-RN notified     Extremity/Trunk Assessment Upper Extremity Assessment Upper Extremity Assessment: Generalized weakness   Lower Extremity Assessment Lower Extremity Assessment: Defer to PT evaluation   Cervical / Trunk Assessment Cervical / Trunk Assessment: Normal   Communication Communication Communication: Impaired Factors Affecting Communication: Difficulty expressing self   Cognition Arousal: Alert Behavior During Therapy: Anxious, Lability Cognition: Cognition impaired   Orientation impairments: Time Awareness: Online awareness impaired   Attention impairment (select first level of impairment): Sustained attention Executive functioning impairment (select all impairments): Reasoning, Problem solving OT - Cognition Comments: SBT: 2/28 indicating normal cognition. Pt unable to recall current month and had one error in stating months of year in reverse order. Pt emotionally labile throughout session, brief moments of calmness and then tearful. Pt perseverates on leg wrapping. Pt apologetic for tearful episodes. Slight executive functioning deficits noted during functional task                 Following commands: Impaired Following commands impaired: Follows one step commands inconsistently, Follows one step commands with increased time     Cueing  General Comments   Cueing Techniques: Verbal cues;Visual cues  Pt agreeable to short term rehab, questioned possibility of ALF following rehab.   Exercises     Shoulder Instructions      Home Living Family/patient expects to be discharged to:: Private residence Living Arrangements: Alone Available Help at Discharge: Friend(s);Available PRN/intermittently Type of Home: Apartment Home Access: Stairs to enter Entrance Stairs-Number of Steps: 6 Entrance Stairs-Rails: Left Home Layout: One level     Bathroom Shower/Tub: Sponge bathes at baseline   Bathroom Toilet: Standard Bathroom  Accessibility: Yes   Home Equipment: Cane - single Librarian, Academic (2 wheels)   Additional Comments: Home set up achieved from previous admission and patient      Prior Functioning/Environment Prior Level of Function : Independent/Modified Independent             Mobility Comments: uses SPC, doesn't drive, has groceries delivered ADLs Comments: bathes at sink    OT Problem List: Decreased strength;Decreased activity tolerance;Impaired balance (sitting and/or standing);Decreased safety awareness;Decreased knowledge of use of DME or AE;Decreased knowledge of precautions;Pain   OT Treatment/Interventions: Self-care/ADL training;Therapeutic exercise;Energy conservation;DME and/or AE instruction;Therapeutic activities;Cognitive remediation/compensation;Patient/family education;Balance training      OT Goals(Current goals can be found in the care plan section)   Acute Rehab OT Goals Patient Stated Goal: to go to rehab OT Goal Formulation: With patient Time For Goal Achievement: 01/20/24 Potential to Achieve Goals: Good ADL Goals Pt Will Perform Lower Body Bathing: with modified independence;with adaptive equipment Pt Will Perform Lower Body Dressing: with min assist;sitting/lateral leans Pt Will Transfer to Toilet: with modified independence;ambulating;bedside commode Additional ADL Goal #1: Pt will engage in cognitive pill box assessment.   OT Frequency:  Min 2X/week    Co-evaluation              AM-PAC OT 6 Clicks Daily Activity     Outcome Measure Help from another person eating meals?: None Help from another person taking care of personal grooming?: A Little Help from another person toileting, which includes using toliet, bedpan, or urinal?: A Little Help from another person bathing (including washing, rinsing, drying)?: A Lot Help from another person to put on and  taking off regular upper body clothing?: A Little Help from another person to put on and  taking off regular lower body clothing?: A Lot 6 Click Score: 17   End of Session Equipment Utilized During Treatment: Rolling walker (2 wheels) Nurse Communication: Mobility status;Other (comment) (Need for LE wrapping)  Activity Tolerance: Patient limited by pain Patient left: in bed;with call bell/phone within reach;with bed alarm set  OT Visit Diagnosis: Unsteadiness on feet (R26.81);Muscle weakness (generalized) (M62.81);History of falling (Z91.81);Other symptoms and signs involving cognitive function;Pain                Time: 8976-8957 OT Time Calculation (min): 19 min Charges:  OT General Charges $OT Visit: 1 Visit OT Evaluation $OT Eval Low Complexity: 1 Low  Christine CROME, OTR/L.  [facility]  Office: [phone]   Christine Jacobson 01/06/2024, 12:24 PM

## 2024-01-06 NOTE — Plan of Care (Signed)
" °  Problem: Education: Goal: Knowledge of General Education information will improve Description: Including pain rating scale, medication(s)/side effects and non-pharmacologic comfort measures 01/06/2024 0737 by Arnell Wyline RAMAN, RN Outcome: Progressing 01/06/2024 0737 by Arnell Wyline RAMAN, RN Outcome: Progressing 01/06/2024 0737 by Arnell Wyline RAMAN, RN Outcome: Progressing   Problem: Health Behavior/Discharge Planning: Goal: Ability to manage health-related needs will improve 01/06/2024 0737 by Arnell Wyline RAMAN, RN Outcome: Progressing 01/06/2024 0737 by Arnell Wyline RAMAN, RN Outcome: Progressing 01/06/2024 0737 by Arnell Wyline RAMAN, RN Outcome: Progressing   Problem: Clinical Measurements: Goal: Ability to maintain clinical measurements within normal limits will improve 01/06/2024 0737 by Arnell Wyline RAMAN, RN Outcome: Progressing 01/06/2024 0737 by Arnell Wyline RAMAN, RN Outcome: Progressing 01/06/2024 0737 by Arnell Wyline RAMAN, RN Outcome: Progressing Goal: Will remain free from infection 01/06/2024 0737 by Arnell Wyline RAMAN, RN Outcome: Progressing 01/06/2024 0737 by Arnell Wyline RAMAN, RN Outcome: Progressing 01/06/2024 0737 by Arnell Wyline RAMAN, RN Outcome: Progressing Goal: Diagnostic test results will improve 01/06/2024 0737 by Arnell Wyline RAMAN, RN Outcome: Progressing 01/06/2024 0737 by Arnell Wyline RAMAN, RN Outcome: Progressing 01/06/2024 0737 by Arnell Wyline RAMAN, RN Outcome: Progressing Goal: Respiratory complications will improve 01/06/2024 0737 by Arnell Wyline RAMAN, RN Outcome: Progressing 01/06/2024 0737 by Arnell Wyline RAMAN, RN Outcome: Progressing 01/06/2024 0737 by Arnell Wyline RAMAN, RN Outcome: Progressing Goal: Cardiovascular complication will be avoided 01/06/2024 0737 by Arnell Wyline RAMAN, RN Outcome: Progressing 01/06/2024 0737 by Arnell Wyline RAMAN, RN Outcome: Progressing 01/06/2024 0737 by Arnell Wyline RAMAN,  RN Outcome: Progressing   Problem: Activity: Goal: Risk for activity intolerance will decrease 01/06/2024 0737 by Arnell Wyline RAMAN, RN Outcome: Progressing 01/06/2024 0737 by Arnell Wyline RAMAN, RN Outcome: Progressing 01/06/2024 0737 by Arnell Wyline RAMAN, RN Outcome: Progressing   Problem: Nutrition: Goal: Adequate nutrition will be maintained 01/06/2024 0737 by Arnell Wyline RAMAN, RN Outcome: Progressing 01/06/2024 0737 by Arnell Wyline RAMAN, RN Outcome: Progressing 01/06/2024 0737 by Arnell Wyline RAMAN, RN Outcome: Progressing   Problem: Coping: Goal: Level of anxiety will decrease 01/06/2024 0737 by Arnell Wyline RAMAN, RN Outcome: Progressing 01/06/2024 0737 by Arnell Wyline RAMAN, RN Outcome: Progressing 01/06/2024 0737 by Arnell Wyline RAMAN, RN Outcome: Progressing   Problem: Elimination: Goal: Will not experience complications related to bowel motility 01/06/2024 0737 by Arnell Wyline RAMAN, RN Outcome: Progressing 01/06/2024 0737 by Arnell Wyline RAMAN, RN Outcome: Progressing 01/06/2024 0737 by Arnell Wyline RAMAN, RN Outcome: Progressing Goal: Will not experience complications related to urinary retention 01/06/2024 0737 by Arnell Wyline RAMAN, RN Outcome: Progressing 01/06/2024 0737 by Arnell Wyline RAMAN, RN Outcome: Progressing 01/06/2024 0737 by Arnell Wyline RAMAN, RN Outcome: Progressing   Problem: Pain Managment: Goal: General experience of comfort will improve and/or be controlled 01/06/2024 0737 by Arnell Wyline RAMAN, RN Outcome: Progressing 01/06/2024 0737 by Arnell Wyline RAMAN, RN Outcome: Progressing 01/06/2024 0737 by Arnell Wyline RAMAN, RN Outcome: Progressing   Problem: Safety: Goal: Ability to remain free from injury will improve 01/06/2024 0737 by Arnell Wyline RAMAN, RN Outcome: Progressing 01/06/2024 0737 by Arnell Wyline RAMAN, RN Outcome: Progressing 01/06/2024 0737 by Arnell Wyline RAMAN, RN Outcome: Progressing   Problem: Skin  Integrity: Goal: Risk for impaired skin integrity will decrease 01/06/2024 0737 by Arnell Wyline RAMAN, RN Outcome: Progressing 01/06/2024 0737 by Arnell Wyline RAMAN, RN Outcome: Progressing 01/06/2024 0737 by Arnell Wyline RAMAN, RN Outcome: Progressing   "

## 2024-01-06 NOTE — Plan of Care (Signed)

## 2024-01-06 NOTE — TOC Initial Note (Addendum)
 Transition of Care [facility]) - Initial/Assessment Note    Patient Details  Name: Christine Jacobson MRN: [id number] Date of Birth: [birth year]  Transition of Care [facility]) CM/SW Contact:    Lendia Dais, LCSWA Phone Number: 01/06/2024, 12:02 PM  Clinical Narrative: CSW spoke to pt at bedside and introduced self and role.   Pt is from home alone and has the DME of a rolling walker. Pt has a PCP, no hx of HH/SNF.   CSW spoke to pt and informed them of PT recs of SNF. CSW asked if the pt would be willing to attend STR and the pt was agreeable. CSW explained the SNF process and left a medicare.gov list at bedside.  CSW pending OT consult for FL2.                  Expected Discharge Plan: Skilled Nursing Facility Barriers to Discharge: Continued Medical Work up   Patient Goals and CMS Choice   CMS Medicare.gov Compare Post Acute Care list provided to:: Patient Choice offered to / list presented to : Patient      Expected Discharge Plan and Services In-house Referral: Clinical Social Work, Orthoptist                                            Prior Living Arrangements/Services   Lives with:: Self Patient language and need for interpreter reviewed:: Yes Do you feel safe going back to the place where you live?: Yes      Need for Family Participation in Patient Care: Yes (Comment) Care giver support system in place?: No (comment) Current home services: DME Criminal Activity/Legal Involvement Pertinent to Current Situation/Hospitalization: No - Comment as needed  Activities of Daily Living   ADL Screening (condition at time of admission) Independently performs ADLs?: Yes (appropriate for developmental age) Is the patient deaf or have difficulty hearing?: No Does the patient have difficulty seeing, even when wearing glasses/contacts?: No Does the patient have difficulty concentrating, remembering, or making decisions?: No  Permission Sought/Granted Permission sought  to share information with : Family Supports Permission granted to share information with : Yes, Verbal Permission Granted  Share Information with NAME: Rock Angus     Permission granted to share info w Relationship: friend  Permission granted to share info w Contact Information: [phone]  Emotional Assessment Appearance:: Appears stated age Attitude/Demeanor/Rapport: Engaged Affect (typically observed): Sad Orientation: : Oriented to Self, Oriented to Place, Oriented to  Time, Oriented to Situation Alcohol / Substance Use: Not Applicable Psych Involvement: No (comment)  Admission diagnosis:  Cellulitis [L03.90] Cellulitis, unspecified cellulitis site [L03.90] Sepsis, due to unspecified organism, unspecified whether acute organ dysfunction present [facility]) [A41.9] Patient Active Problem List   Diagnosis Date Noted   (HFpEF) heart failure with preserved ejection fraction (HCC) 11/22/2023   Syncope and collapse 11/21/2023   Vulvar cellulitis 11/13/2023   Ovarian mass 11/13/2023   Ovarian mass, left 11/13/2023   Sepsis (HCC) 11/13/2023   Coronary artery calcification seen on CAT scan 10/13/2023   Elevated troponin 10/12/2023   Abnormal stress test 10/12/2023   Acute diastolic (congestive) heart failure (HCC) 10/11/2023   Persistent atrial fibrillation (HCC) 12/28/2022   Acute on chronic diastolic CHF (congestive heart failure) (HCC) 12/27/2022   Major depressive disorder, 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   MDD (major depressive disorder) 09/29/2022   Anxiety 09/16/2022  Acute exacerbation of CHF (congestive heart failure) (HCC) 09/15/2022   Atrial fibrillation with RVR (HCC) 09/15/2022   Leg swelling 09/15/2022   GIB (gastrointestinal bleeding) 08/05/2022   Acquired thrombophilia 08/05/2022   Chronic anticoagulation 08/05/2022   Acute blood loss anemia 08/04/2022   Left leg cellulitis 07/13/2022   Hypocarbia 07/13/2022   Normocytic anemia  07/13/2022   Cellulitis 06/02/2022   Sepsis due to cellulitis (HCC) 06/02/2022   AKI (acute kidney injury) 06/02/2022   Hyponatremia 06/02/2022   Dehydration 06/02/2022   Metabolic acidosis 06/02/2022   Pulmonary HTN (HCC)    Cellulitis and abscess of left lower extremity 10/13/2021   PAF (paroxysmal atrial fibrillation) (HCC) 07/28/2021   Obesity (BMI 30-39.9)    Liver function test abnormality    Primary hypertension    Chronic diastolic heart failure (HCC) 12/15/2016   HOCM (hypertrophic obstructive cardiomyopathy) (HCC) 01/22/2016   Lower extremity edema 09/17/2014   History of syncope 12/05/2013   Syncope 12/05/2013   Pneumonia 11/23/2012   Shortness of breath 07/11/2011   Hypertension 11/23/2010   Chest pain of uncertain etiology 10/18/2010   Murmur 10/18/2010   Palpitations 09/23/2010   Mild hyperlipidemia    Elevated BP    Hyperprolactinemia (HCC)    Pyoderma gangrenosa (HCC)    PCP:  [name], NP Pharmacy:   [facility] - [facility] [address] [place] [address] Phone: [phone] Fax: [phone]  [facility] - [place], [place] - [address] [place] [address] Phone: [phone] Fax: [phone]     Social Drivers of Health (SDOH) Social History: SDOH Screenings   Food Insecurity: No Food Insecurity (01/04/2024)  Housing: Low Risk (01/04/2024)  Transportation Needs: No Transportation Needs (01/04/2024)  Utilities: Not At Risk (01/04/2024)  Depression (PHQ2-9): Low Risk (10/26/2021)  Financial Resource Strain: Low Risk (12/03/2021)  Social Connections: Moderately Isolated (01/04/2024)  Tobacco Use: Medium Risk (11/21/2023)   SDOH Interventions:     Readmission Risk Interventions    11/16/2023   10:02 AM 05/12/2023    4:15 PM 12/28/2022   11:28 AM  Readmission Risk Prevention Plan  Transportation Screening Complete Complete Complete  PCP or Specialist Appt within  3-5 Days Complete  Complete  HRI or Home Care Consult Complete  Complete  Social Work Consult for Recovery Care Planning/Counseling Complete  Complete  Palliative Care Screening Not Applicable  Not Applicable  Medication Review Oceanographer)  Complete Complete  HRI or Home Care Consult  Complete   Palliative Care Screening  Not Applicable   Skilled Nursing Facility  Not Applicable

## 2024-01-06 NOTE — Progress Notes (Signed)
 " PROGRESS NOTE    Christine Jacobson  FMW:[id number] DOB: December 12, 1955 DOA: 01/04/2024 PCP: [name], NP  [name] is a chronically ill obese female with history of diastolic CHF, HOCM, persistent A-fib on Eliquis , ovarian mass, history of recurrent cellulitis presented to the ED with weakness and fall.  Frail at baseline, weaker in the last few days, had pain redness and swelling in her right leg, had some difficulty getting up from the couch and then subsequently fell on the ground, landed on her right buttock, subsequently called EMS and presented to the ED. ED Course: Afebrile, persistent A-fib with RVR, labs noted lactic acidosis, proBNP 2651, potassium 5.5, creatinine 0.9, WBC 13.4 -Also noted to be volume overloaded, started on diuretics   Subjective: - Crying and anxious, unable to make decisions  Assessment and Plan:  Sepsis, POA Right lower extremity cellulitis - Continue IV ceftriaxone  - Cultures negative thus far - Continue wound care   Acute on chronic diastolic CHF Pulmonary hypertension HOCM - Edematous, fairly obese, volume status difficult to assess - Continue IV Lasix  today, metoprolol  - Add potassium  Hyperkalemia Resolved   Persistent atrial fibrillation with RVR -Heart rate higher at this time, likely driven by infectious process, -Continue Toprol , dose increased to 50 mg, continue Eliquis    Ovarian mass -Needs GYN follow-up  Elevated alkaline phosphatase - Needs follow-up   DVT prophylaxis: Apixaban  Code Status: DNR Family Communication: None present Disposition Plan: May need rehab, undecided  Consultants:    Procedures:   Antimicrobials:    Objective: Vitals:   01/05/24 0724 01/05/24 1433 01/05/24 1924 01/06/24 0748  BP: 114/86 107/72 (!) 116/54 107/66  Pulse: 94 90 92 94  Resp: 16 16 18 16   Temp: 97.8 F (36.6 C) 98.3 F (36.8 C) 97.9 F (36.6 C) 98.1 F (36.7 C)  TempSrc:  Oral    SpO2: 97% 96% 98% 99%     Intake/Output Summary (Last 24 hours) at 01/06/2024 1016 Last data filed at 01/06/2024 0900 Gross per 24 hour  Intake 720 ml  Output --  Net 720 ml   There were no vitals filed for this visit.  Examination:  General exam: Appears calm and comfortable, AO x 3, anxious, tearful Respiratory system: Decreased at the bases Cardiovascular system: S1 & S2 heard, irregular Abd: nondistended, soft and nontender.Normal bowel sounds heard. Central nervous system: Alert and oriented. No focal neurological deficits. Extremities: 1+ edema, erythema and swelling improving, skin breakdown Skin: As above Psychiatry:  Mood & affect appropriate.     Data Reviewed:   CBC: Recent Labs  Lab 01/04/24 0832 01/04/24 0850 01/05/24 0416 01/06/24 0652  WBC  --  13.4* 7.7 6.6  HGB 13.3 11.7* 10.9* 11.1*  HCT 39.0 38.3 35.4* 35.8*  MCV  --  90.3 90.3 89.1  PLT  --  278 284 284   Basic Metabolic Panel: Recent Labs  Lab 01/04/24 0832 01/04/24 0850 01/05/24 0416 01/06/24 0652  NA 138 137 137 140  K 5.3* 5.5* 4.8 3.6  CL 111 106 108 105  CO2  --  16* 20* 27  GLUCOSE 85 82 90 89  BUN 24* 21 23 24*  CREATININE 0.90 0.90 1.10* 1.21*  CALCIUM   --  10.1 9.8 9.2   GFR: CrCl cannot be calculated (Unknown ideal weight.). Liver Function Tests: Recent Labs  Lab 01/04/24 0850 01/05/24 0416  AST 44* 30  ALT 20 14  ALKPHOS 251* 204*  BILITOT 1.2 0.5  PROT 6.7 5.8*  ALBUMIN  3.7 3.3*   No results for input(s): LIPASE, AMYLASE in the last 168 hours. No results for input(s): AMMONIA in the last 168 hours. Coagulation Profile: Recent Labs  Lab 01/04/24 0850  INR 1.0   Cardiac Enzymes: No results for input(s): CKTOTAL, CKMB, CKMBINDEX, TROPONINI in the last 168 hours. BNP (last 3 results) Recent Labs    10/11/23 0935 10/12/23 0458 01/04/24 0850  PROBNP 4,647.0* 4,823.0* 2,651.0*   HbA1C: No results for input(s): HGBA1C in the last 72 hours. CBG: No results for  input(s): GLUCAP in the last 168 hours. Lipid Profile: No results for input(s): CHOL, HDL, LDLCALC, TRIG, CHOLHDL, LDLDIRECT in the last 72 hours. Thyroid Function Tests: No results for input(s): TSH, T4TOTAL, FREET4, T3FREE, THYROIDAB in the last 72 hours. Anemia Panel: No results for input(s): VITAMINB12, FOLATE, FERRITIN, TIBC, IRON , RETICCTPCT in the last 72 hours. Urine analysis:    Component Value Date/Time   COLORURINE YELLOW 01/04/2024 0945   APPEARANCEUR CLEAR 01/04/2024 0945   LABSPEC 1.025 01/04/2024 0945   PHURINE 5.0 01/04/2024 0945   GLUCOSEU NEGATIVE 01/04/2024 0945   HGBUR NEGATIVE 01/04/2024 0945   BILIRUBINUR NEGATIVE 01/04/2024 0945   KETONESUR 5 (A) 01/04/2024 0945   PROTEINUR 100 (A) 01/04/2024 0945   NITRITE NEGATIVE 01/04/2024 0945   LEUKOCYTESUR TRACE (A) 01/04/2024 0945   Sepsis Labs: @LABRCNTIP (procalcitonin:4,lacticidven:4)  ) Recent Results (from the past 240 hours)  Culture, blood (routine x 2)     Status: None (Preliminary result)   Collection Time: 01/04/24  8:37 AM   Specimen: BLOOD LEFT ARM  Result Value Ref Range Status   Specimen Description BLOOD LEFT ARM  Final   Special Requests   Final    AEROBIC BOTTLE ONLY Blood Culture results may not be optimal due to an inadequate volume of blood received in culture bottles   Culture   Final    NO GROWTH 2 DAYS Performed at [facility], [address] [place], [place] [address]    Report Status PENDING  Incomplete  Culture, blood (routine x 2)     Status: None (Preliminary result)   Collection Time: 01/04/24  8:42 AM   Specimen: BLOOD RIGHT ARM  Result Value Ref Range Status   Specimen Description BLOOD RIGHT ARM  Final   Special Requests   Final    BOTTLES DRAWN AEROBIC AND ANAEROBIC Blood Culture results may not be optimal due to an inadequate volume of blood received in culture bottles   Culture   Final    NO GROWTH 2 DAYS Performed at [facility], [address], [place], [place] [address]    Report Status PENDING  Incomplete     Radiology Studies: No results found.    Scheduled Meds:  apixaban   5 mg Oral BID   furosemide   40 mg Intravenous BID   metoprolol  succinate  50 mg Oral Daily   pantoprazole   40 mg Oral Daily   zinc  sulfate (50mg  elemental zinc )  220 mg Oral Daily   Continuous Infusions:  cefTRIAXone  (ROCEPHIN )  IV 1 g (01/05/24 1302)     LOS: 2 days    Time spent:    Sigurd Pac, MD [facility]   01/06/2024, 10:16 AM    "

## 2024-01-06 NOTE — Plan of Care (Signed)
" °  Problem: Education: Goal: Knowledge of General Education information will improve Description: Including pain rating scale, medication(s)/side effects and non-pharmacologic comfort measures 01/06/2024 0737 by Arnell Wyline RAMAN, RN Outcome: Progressing 01/06/2024 0737 by Arnell Wyline RAMAN, RN Outcome: Progressing   Problem: Health Behavior/Discharge Planning: Goal: Ability to manage health-related needs will improve 01/06/2024 0737 by Arnell Wyline RAMAN, RN Outcome: Progressing 01/06/2024 0737 by Arnell Wyline RAMAN, RN Outcome: Progressing   Problem: Clinical Measurements: Goal: Ability to maintain clinical measurements within normal limits will improve 01/06/2024 0737 by Arnell Wyline RAMAN, RN Outcome: Progressing 01/06/2024 0737 by Arnell Wyline RAMAN, RN Outcome: Progressing Goal: Will remain free from infection 01/06/2024 0737 by Arnell Wyline RAMAN, RN Outcome: Progressing 01/06/2024 0737 by Arnell Wyline RAMAN, RN Outcome: Progressing Goal: Diagnostic test results will improve 01/06/2024 0737 by Arnell Wyline RAMAN, RN Outcome: Progressing 01/06/2024 0737 by Arnell Wyline RAMAN, RN Outcome: Progressing Goal: Respiratory complications will improve 01/06/2024 0737 by Arnell Wyline RAMAN, RN Outcome: Progressing 01/06/2024 0737 by Arnell Wyline RAMAN, RN Outcome: Progressing Goal: Cardiovascular complication will be avoided 01/06/2024 0737 by Arnell Wyline RAMAN, RN Outcome: Progressing 01/06/2024 0737 by Arnell Wyline RAMAN, RN Outcome: Progressing   Problem: Activity: Goal: Risk for activity intolerance will decrease 01/06/2024 0737 by Arnell Wyline RAMAN, RN Outcome: Progressing 01/06/2024 0737 by Arnell Wyline RAMAN, RN Outcome: Progressing   Problem: Nutrition: Goal: Adequate nutrition will be maintained 01/06/2024 0737 by Arnell Wyline RAMAN, RN Outcome: Progressing 01/06/2024 0737 by Arnell Wyline RAMAN, RN Outcome: Progressing   Problem: Coping: Goal: Level  of anxiety will decrease 01/06/2024 0737 by Arnell Wyline RAMAN, RN Outcome: Progressing 01/06/2024 0737 by Arnell Wyline RAMAN, RN Outcome: Progressing   Problem: Elimination: Goal: Will not experience complications related to bowel motility 01/06/2024 0737 by Arnell Wyline RAMAN, RN Outcome: Progressing 01/06/2024 0737 by Arnell Wyline RAMAN, RN Outcome: Progressing Goal: Will not experience complications related to urinary retention 01/06/2024 0737 by Arnell Wyline RAMAN, RN Outcome: Progressing 01/06/2024 0737 by Arnell Wyline RAMAN, RN Outcome: Progressing   Problem: Pain Managment: Goal: General experience of comfort will improve and/or be controlled 01/06/2024 0737 by Arnell Wyline RAMAN, RN Outcome: Progressing 01/06/2024 0737 by Arnell Wyline RAMAN, RN Outcome: Progressing   Problem: Safety: Goal: Ability to remain free from injury will improve 01/06/2024 0737 by Arnell Wyline RAMAN, RN Outcome: Progressing 01/06/2024 0737 by Arnell Wyline RAMAN, RN Outcome: Progressing   Problem: Skin Integrity: Goal: Risk for impaired skin integrity will decrease 01/06/2024 0737 by Arnell Wyline RAMAN, RN Outcome: Progressing 01/06/2024 0737 by Arnell Wyline RAMAN, RN Outcome: Progressing   "

## 2024-01-06 NOTE — Progress Notes (Addendum)
 CSW attempted to complete initial assessment. CSW informed the pt of PT recs. Pt was teaful and stated they were unable to make a decision at the time and the pt was agreeable. CSW offered to return at a later time. Pt requested a chaplain visit, CSW place spiritual consult orders.  CSW will re-attempt visit.

## 2024-01-07 DIAGNOSIS — L03115 Cellulitis of right lower limb: Secondary | ICD-10-CM | POA: Diagnosis not present

## 2024-01-07 MED ORDER — FUROSEMIDE 10 MG/ML IJ SOLN
40.0000 mg | Freq: Two times a day (BID) | INTRAMUSCULAR | Status: AC
  Administered 2024-01-07: 40 mg via INTRAVENOUS
  Filled 2024-01-07: qty 4

## 2024-01-07 MED ORDER — POTASSIUM CHLORIDE CRYS ER 20 MEQ PO TBCR
40.0000 meq | EXTENDED_RELEASE_TABLET | Freq: Once | ORAL | Status: AC
  Administered 2024-01-07: 40 meq via ORAL
  Filled 2024-01-07: qty 2

## 2024-01-07 NOTE — Progress Notes (Signed)
 " PROGRESS NOTE    Christine Jacobson  FMW:[id number] DOB: 1955/06/21 DOA: 01/04/2024 PCP: [name], NP  onnie Beth Jacobson is a chronically ill obese female with history of diastolic CHF, HOCM, persistent A-fib on Eliquis , ovarian mass, history of recurrent cellulitis presented to the ED with weakness and fall.  Frail at baseline, weaker in the last few days, had pain redness and swelling in her right leg, had some difficulty getting up from the couch and then subsequently fell on the ground, landed on her right buttock, subsequently called EMS and presented to the ED. ED Course: Afebrile, persistent A-fib with RVR, labs noted lactic acidosis, proBNP 2651, potassium 5.5, creatinine 0.9, WBC 13.4 -Also noted to be volume overloaded, started on diuretics   Subjective: - Feels fair overall, no events overnight, somewhat undecided on rehab  Assessment and Plan:  Sepsis, POA Right lower extremity cellulitis - Continue IV ceftriaxone , switch to oral Keflex  tomorrow - Cultures negative thus far - Continue wound care   Acute on chronic diastolic CHF Pulmonary hypertension HOCM - Edematous, fairly obese, volume status difficult to assess - Continue IV Lasix  today, metoprolol , switch to oral diuretics tomorrow - Add potassium, aldactone   Hyperkalemia Resolved   Persistent atrial fibrillation with RVR -Heart rate higher at this time, likely driven by infectious process, -Continue Toprol , dose increased to 50 mg, continue Eliquis    Ovarian mass -Needs GYN follow-up  Elevated alkaline phosphatase - Needs follow-up   DVT prophylaxis: Apixaban  Code Status: DNR Family Communication: None present Disposition Plan: May need rehab, undecided  Consultants:    Procedures:   Antimicrobials:    Objective: Vitals:   01/06/24 2138 01/07/24 0423 01/07/24 0434 01/07/24 0741  BP: 98/68  130/80 116/86  Pulse: 86 79 81 81  Resp: 15  18 16   Temp: 98.3 F (36.8 C) 98.9 F (37.2  C) 97.8 F (36.6 C) 98.3 F (36.8 C)  TempSrc: Oral Oral Oral Oral  SpO2: 97% 100% 94% 96%    Intake/Output Summary (Last 24 hours) at 01/07/2024 1211 Last data filed at 01/06/2024 1700 Gross per 24 hour  Intake 120 ml  Output --  Net 120 ml   There were no vitals filed for this visit.  Examination:  General exam: Appears calm and comfortable, AO x 3, no stress Respiratory system: Air, poor air movement Cardiovascular system: S1 & S2 heard, irregular Abd: nondistended, soft and nontender.Normal bowel sounds heard. Central nervous system: Alert and oriented. No focal neurological deficits. Extremities: 1+ edema, erythema and swelling improving, skin breakdown: Overall improving Skin: As above Psychiatry:  Mood & affect appropriate.     Data Reviewed:   CBC: Recent Labs  Lab 01/04/24 0832 01/04/24 0850 01/05/24 0416 01/06/24 0652  WBC  --  13.4* 7.7 6.6  HGB 13.3 11.7* 10.9* 11.1*  HCT 39.0 38.3 35.4* 35.8*  MCV  --  90.3 90.3 89.1  PLT  --  278 284 284   Basic Metabolic Panel: Recent Labs  Lab 01/04/24 0832 01/04/24 0850 01/05/24 0416 01/06/24 0652  NA 138 137 137 140  K 5.3* 5.5* 4.8 3.6  CL 111 106 108 105  CO2  --  16* 20* 27  GLUCOSE 85 82 90 89  BUN 24* 21 23 24*  CREATININE 0.90 0.90 1.10* 1.21*  CALCIUM   --  10.1 9.8 9.2   GFR: CrCl cannot be calculated (Unknown ideal weight.). Liver Function Tests: Recent Labs  Lab 01/04/24 0850 01/05/24 0416  AST 44* 30  ALT 20 14  ALKPHOS 251* 204*  BILITOT 1.2 0.5  PROT 6.7 5.8*  ALBUMIN  3.7 3.3*   No results for input(s): LIPASE, AMYLASE in the last 168 hours. No results for input(s): AMMONIA in the last 168 hours. Coagulation Profile: Recent Labs  Lab 01/04/24 0850  INR 1.0   Cardiac Enzymes: No results for input(s): CKTOTAL, CKMB, CKMBINDEX, TROPONINI in the last 168 hours. BNP (last 3 results) Recent Labs    10/11/23 0935 10/12/23 0458 01/04/24 0850  PROBNP 4,647.0*  4,823.0* 2,651.0*   HbA1C: No results for input(s): HGBA1C in the last 72 hours. CBG: No results for input(s): GLUCAP in the last 168 hours. Lipid Profile: No results for input(s): CHOL, HDL, LDLCALC, TRIG, CHOLHDL, LDLDIRECT in the last 72 hours. Thyroid Function Tests: No results for input(s): TSH, T4TOTAL, FREET4, T3FREE, THYROIDAB in the last 72 hours. Anemia Panel: No results for input(s): VITAMINB12, FOLATE, FERRITIN, TIBC, IRON , RETICCTPCT in the last 72 hours. Urine analysis:    Component Value Date/Time   COLORURINE YELLOW 01/04/2024 0945   APPEARANCEUR CLEAR 01/04/2024 0945   LABSPEC 1.025 01/04/2024 0945   PHURINE 5.0 01/04/2024 0945   GLUCOSEU NEGATIVE 01/04/2024 0945   HGBUR NEGATIVE 01/04/2024 0945   BILIRUBINUR NEGATIVE 01/04/2024 0945   KETONESUR 5 (A) 01/04/2024 0945   PROTEINUR 100 (A) 01/04/2024 0945   NITRITE NEGATIVE 01/04/2024 0945   LEUKOCYTESUR TRACE (A) 01/04/2024 0945   Sepsis Labs: @LABRCNTIP (procalcitonin:4,lacticidven:4)  ) Recent Results (from the past 240 hours)  Culture, blood (routine x 2)     Status: None (Preliminary result)   Collection Time: 01/04/24  8:37 AM   Specimen: BLOOD LEFT ARM  Result Value Ref Range Status   Specimen Description BLOOD LEFT ARM  Final   Special Requests   Final    AEROBIC BOTTLE ONLY Blood Culture results may not be optimal due to an inadequate volume of blood received in culture bottles   Culture   Final    NO GROWTH 3 DAYS Performed at [facility], [address], [place], [place] [address]    Report Status PENDING  Incomplete  Culture, blood (routine x 2)     Status: None (Preliminary result)   Collection Time: 01/04/24  8:42 AM   Specimen: BLOOD RIGHT ARM  Result Value Ref Range Status   Specimen Description BLOOD RIGHT ARM  Final   Special Requests   Final    BOTTLES DRAWN AEROBIC AND ANAEROBIC Blood Culture results may not be optimal due to an inadequate  volume of blood received in culture bottles   Culture   Final    NO GROWTH 3 DAYS Performed at [facility], [address], [place], [place] [address]    Report Status PENDING  Incomplete     Radiology Studies: No results found.    Scheduled Meds:  apixaban   5 mg Oral BID   furosemide   40 mg Intravenous BID   metoprolol  succinate  50 mg Oral Daily   pantoprazole   40 mg Oral Daily   potassium chloride   40 mEq Oral Once   zinc  sulfate (50mg  elemental zinc )  220 mg Oral Daily   Continuous Infusions:  cefTRIAXone  (ROCEPHIN )  IV 1 g (01/06/24 1409)     LOS: 3 days    Time spent:    Sigurd Pac, MD [facility]   01/07/2024, 12:11 PM    "

## 2024-01-07 NOTE — Progress Notes (Signed)
 Mobility Specialist: Progress Note   01/07/24 1500  Mobility  Activity Ambulated with assistance  Level of Assistance Standby assist, set-up cues, supervision of patient - no hands on  Assistive Device Front wheel walker  Distance Ambulated (ft) 140 ft  Activity Response Tolerated well  Mobility Referral Yes  Mobility visit 1 Mobility  Mobility Specialist Start Time (ACUTE ONLY) 1055  Mobility Specialist Stop Time (ACUTE ONLY) 1108  Mobility Specialist Time Calculation (min) (ACUTE ONLY) 13 min    Pt received in bed, c/o being tired but reluctantly agreeable. Started getting emotional about discharge plans and appreciative of MS listening and offering support. Very pleasant afterwards and continued with hallway ambulation. SV throughout. No complaints. Returned to room without fault. Left on EOB with all needs met, call bell in reach.   Ileana Lute Mobility Specialist Please contact via SecureChat or Rehab office at [phone]

## 2024-01-07 NOTE — Plan of Care (Signed)

## 2024-01-07 NOTE — [place] FL2 (Signed)
 " [place]  MEDICAID FL2 LEVEL OF CARE FORM     IDENTIFICATION  Patient Name: Christine Jacobson Birthdate: Jan 23, 1955 Sex: female Admission Date (Current Location): 01/04/2024  [facility] and [place] Number:  Producer, Television/film/video and Address:  [facility], [address] [place], [place] [address]      Provider Number: [id number]  Attending Physician Name and Address:  Fairy Frames, MD  Relative Name and Phone Number:  gari rock Benne, Emergency Contact  [phone]    Current Level of Care: Hospital Recommended Level of Care: Skilled Nursing Facility Prior Approval Number:    Date Approved/Denied:   PASRR Number: [id number]  Discharge Plan: SNF    Current Diagnoses: Patient Active Problem List   Diagnosis Date Noted   (HFpEF) heart failure with preserved ejection fraction (HCC) 11/22/2023   Syncope and collapse 11/21/2023   Vulvar cellulitis 11/13/2023   Ovarian mass 11/13/2023   Ovarian mass, left 11/13/2023   Sepsis (HCC) 11/13/2023   Coronary artery calcification seen on CAT scan 10/13/2023   Elevated troponin 10/12/2023   Abnormal stress test 10/12/2023   Acute diastolic (congestive) heart failure (HCC) 10/11/2023   Persistent atrial fibrillation (HCC) 12/28/2022   Acute on chronic diastolic CHF (congestive heart failure) (HCC) 12/27/2022   Major depressive disorder, 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   MDD (major depressive disorder) 09/29/2022   Anxiety 09/16/2022   Acute exacerbation of CHF (congestive heart failure) (HCC) 09/15/2022   Atrial fibrillation with RVR (HCC) 09/15/2022   Leg swelling 09/15/2022   GIB (gastrointestinal bleeding) 08/05/2022   Acquired thrombophilia 08/05/2022   Chronic anticoagulation 08/05/2022   Acute blood loss anemia 08/04/2022   Left leg cellulitis 07/13/2022   Hypocarbia 07/13/2022   Normocytic anemia 07/13/2022   Cellulitis 06/02/2022   Sepsis due to cellulitis  (HCC) 06/02/2022   AKI (acute kidney injury) 06/02/2022   Hyponatremia 06/02/2022   Dehydration 06/02/2022   Metabolic acidosis 06/02/2022   Pulmonary HTN (HCC)    Cellulitis and abscess of left lower extremity 10/13/2021   PAF (paroxysmal atrial fibrillation) (HCC) 07/28/2021   Obesity (BMI 30-39.9)    Liver function test abnormality    Primary hypertension    Chronic diastolic heart failure (HCC) 12/15/2016   HOCM (hypertrophic obstructive cardiomyopathy) (HCC) 01/22/2016   Lower extremity edema 09/17/2014   History of syncope 12/05/2013   Syncope 12/05/2013   Pneumonia 11/23/2012   Shortness of breath 07/11/2011   Hypertension 11/23/2010   Chest pain of uncertain etiology 10/18/2010   Murmur 10/18/2010   Palpitations 09/23/2010   Mild hyperlipidemia    Elevated BP    Hyperprolactinemia (HCC)    Pyoderma gangrenosa (HCC)     Orientation RESPIRATION BLADDER Height & Weight     Self, Time, Situation, Place  Normal Incontinent Weight:   Height:     BEHAVIORAL SYMPTOMS/MOOD NEUROLOGICAL BOWEL NUTRITION STATUS      Incontinent Diet (DC summary)  AMBULATORY STATUS COMMUNICATION OF NEEDS Skin   Extensive Assist Verbally Other (Comment)                       Personal Care Assistance Level of Assistance  Bathing, Feeding, Dressing Bathing Assistance: Maximum assistance Feeding assistance: Limited assistance Dressing Assistance: Maximum assistance     Functional Limitations Info  Sight, Hearing, Speech Sight Info: Impaired Hearing Info: Adequate Speech Info: Adequate    SPECIAL CARE FACTORS FREQUENCY  PT (By licensed PT), OT (By licensed OT)  PT Frequency: 5x a week OT Frequency: 5x a week            Contractures Contractures Info: Not present    Additional Factors Info  Code Status, Allergies Code Status Info: DNR Allergies Info: Porcine (Pork) Protein-containing Drug Products           Current Medications (01/07/2024):  This is the current  hospital active medication list Current Facility-Administered Medications  Medication Dose Route Frequency Provider Last Rate Last Admin   acetaminophen  (TYLENOL ) tablet 650 mg  650 mg Oral Q6H PRN Joseph, Preetha, MD   650 mg at 01/06/24 2129   apixaban  (ELIQUIS ) tablet 5 mg  5 mg Oral BID Fairy Frames, MD   5 mg at 01/07/24 0933   cefTRIAXone  (ROCEPHIN ) 1 g in sodium chloride  0.9 % 100 mL IVPB  1 g Intravenous Q24H Fairy Frames, MD 200 mL/hr at 01/06/24 1409 1 g at 01/06/24 1409   furosemide  (LASIX ) injection 40 mg  40 mg Intravenous BID Joseph, Preetha, MD   40 mg at 01/07/24 0933   metoprolol  succinate (TOPROL -XL) 24 hr tablet 50 mg  50 mg Oral Daily Joseph, Preetha, MD   50 mg at 01/07/24 0933   ondansetron  (ZOFRAN ) tablet 4 mg  4 mg Oral Q6H PRN Joseph, Preetha, MD       Or   ondansetron  (ZOFRAN ) injection 4 mg  4 mg Intravenous Q6H PRN Fairy Frames, MD       pantoprazole  (PROTONIX ) EC tablet 40 mg  40 mg Oral Daily Joseph, Preetha, MD   40 mg at 01/07/24 0933   polyethylene glycol (MIRALAX  / GLYCOLAX ) packet 17 g  17 g Oral Daily PRN Joseph, Preetha, MD       zinc  sulfate (50mg  elemental zinc ) capsule 220 mg  220 mg Oral Daily Joseph, Preetha, MD   220 mg at 01/07/24 9066     Discharge Medications: Please see discharge summary for a list of discharge medications.  Relevant Imaging Results:  Relevant Lab Results:   Additional Information [id number]  Cena Ligas, LCSW     "

## 2024-01-08 DIAGNOSIS — L03115 Cellulitis of right lower limb: Secondary | ICD-10-CM | POA: Diagnosis not present

## 2024-01-08 LAB — BASIC METABOLIC PANEL WITH GFR
Anion gap: 9 (ref 5–15)
BUN: 21 mg/dL (ref 8–23)
CO2: 26 mmol/L (ref 22–32)
Calcium: 9.3 mg/dL (ref 8.9–10.3)
Chloride: 105 mmol/L (ref 98–111)
Creatinine, Ser: 0.97 mg/dL (ref 0.44–1.00)
GFR, Estimated: >60 mL/min
Glucose, Bld: 91 mg/dL (ref 70–99)
Potassium: 4.1 mmol/L (ref 3.5–5.1)
Sodium: 140 mmol/L (ref 135–145)

## 2024-01-08 MED ORDER — CEPHALEXIN 250 MG PO CAPS
250.0000 mg | ORAL_CAPSULE | Freq: Three times a day (TID) | ORAL | Status: DC
  Administered 2024-01-08 – 2024-01-10 (×7): 250 mg via ORAL
  Filled 2024-01-08 (×9): qty 1

## 2024-01-08 MED ORDER — SPIRONOLACTONE 25 MG PO TABS
25.0000 mg | ORAL_TABLET | Freq: Every day | ORAL | Status: DC
  Administered 2024-01-08 – 2024-01-10 (×3): 25 mg via ORAL
  Filled 2024-01-08 (×3): qty 1

## 2024-01-08 MED ORDER — FUROSEMIDE 40 MG PO TABS
40.0000 mg | ORAL_TABLET | Freq: Every day | ORAL | Status: DC
  Administered 2024-01-08 – 2024-01-09 (×2): 40 mg via ORAL
  Filled 2024-01-08 (×2): qty 1

## 2024-01-08 NOTE — Progress Notes (Signed)
 Pt refused dressing change to RLE, the nurse educated on importance of wound care routine

## 2024-01-08 NOTE — Care Management Important Message (Signed)
 Important Message  Patient Details  Name: Christine Jacobson MRN: [id number] Date of Birth: [birth year]   Important Message Given:  Yes - Medicare IM     Jennie Laneta Dragon 01/08/2024, 2:12 PM

## 2024-01-08 NOTE — TOC Progression Note (Signed)
 Transition of Care [facility]) - Progression Note    Patient Details  Name: Christine Jacobson MRN: [id number] Date of Birth: 08-25-1955  [facility]) CM/SW Contact  Bridget Cordella Simmonds, LCSW Phone Number: 01/08/2024, 1:06 PM  Clinical Narrative:   CSW spoke with pt regarding SNF bed offers.  Pt now stating plans to DC home with [facility], requesting [facility].  Current DME in home: walker, cane.  RNCM notified.     Expected Discharge Plan: Skilled Nursing Facility Barriers to Discharge: Continued Medical Work up               Expected Discharge Plan and Services In-house Referral: Clinical Social Work, Orthoptist                                             Social Drivers of Health (SDOH) Interventions SDOH Screenings   Food Insecurity: No Food Insecurity (01/04/2024)  Housing: Low Risk (01/04/2024)  Transportation Needs: No Transportation Needs (01/04/2024)  Utilities: Not At Risk (01/04/2024)  Depression (PHQ2-9): Low Risk (10/26/2021)  Financial Resource Strain: Low Risk (12/03/2021)  Social Connections: Moderately Isolated (01/04/2024)  Tobacco Use: Medium Risk (11/21/2023)    Readmission Risk Interventions    11/16/2023   10:02 AM 05/12/2023    4:15 PM 12/28/2022   11:28 AM  Readmission Risk Prevention Plan  Transportation Screening Complete Complete Complete  PCP or Specialist Appt within 3-5 Days Complete  Complete  HRI or Home Care Consult Complete  Complete  Social Work Consult for Recovery Care Planning/Counseling Complete  Complete  Palliative Care Screening Not Applicable  Not Applicable  Medication Review Oceanographer)  Complete Complete  HRI or Home Care Consult  Complete   Palliative Care Screening  Not Applicable   Skilled Nursing Facility  Not Applicable

## 2024-01-08 NOTE — Progress Notes (Signed)
" °   01/08/24 1048  Spiritual Encounters  Type of Visit Initial  Care provided to: Patient  Referral source Social worker/Care management/TOC  Reason for visit Routine spiritual support  OnCall Visit No  Spiritual Framework  Presenting Themes Impactful experiences and emotions  Community/Connection Spiritual leader;Faith community  Patient Stress Factors Exhausted;Health changes  Family Stress Factors None identified  Interventions  Spiritual Care Interventions Made Compassionate presence;Established relationship of care and support;Prayer;Normalization of emotions  Intervention Outcomes  Outcomes Reduced anxiety;Awareness of support   Chaplain met with the Pt, who shared concerns related to isolation, loneliness and ongoing stress, including moments of distress. Chaplain provided presence through active listening and offered spiritual and emotional support.  At the Pt's request, chaplain offered a word of prayer.  The Pt expressed gratitude for the visit. Chaplain reminded the Pt that chaplain services remain available at any time  should additional support be needed. "

## 2024-01-08 NOTE — Progress Notes (Addendum)
 Pt is refusing heart monitoring,states she is discharging the next day and wishes not to have. This nurse educated pt. notified MD and CCMD

## 2024-01-08 NOTE — Progress Notes (Signed)
 " PROGRESS NOTE    Christine Jacobson  FMW:[id number] DOB: [birth year] DOA: 01/04/2024 PCP: [name], NP  Christine Jacobson is a chronically ill obese female with history of diastolic CHF, HOCM, persistent A-fib on Eliquis , ovarian mass, history of recurrent cellulitis presented to the ED with weakness and fall.  Frail at baseline, weaker in the last few days, had pain redness and swelling in her right leg, had some difficulty getting up from the couch and then subsequently fell on the ground, landed on her right buttock, subsequently called EMS and presented to the ED. ED Course: Afebrile, persistent A-fib with RVR, labs noted lactic acidosis, proBNP 2651, potassium 5.5, creatinine 0.9, WBC 13.4 -Also noted to be volume overloaded, started on diuretics   Subjective: - Feels okay, no events overnight, ambulated with mobility tech yesterday and today, feels like she can go home  Assessment and Plan:  Sepsis, POA Right lower extremity cellulitis - Improving on IV ceftriaxone , switch to oral Keflex  today - Cultures negative thus far - Continue wound care -Ambulating fairly well, now considering discharge home, add home health PT and RN diarrhea   Acute on chronic diastolic CHF Pulmonary hypertension HOCM - Edematous, fairly obese, volume status difficult to assess - With IV Lasix , volume status is improved, switch to oral Lasix , continue Toprol  and Aldactone   Hyperkalemia Resolved   Persistent atrial fibrillation with RVR -Heart rate higher at this time, likely driven by infectious process, now improved -Continue Toprol  50 mg, continue Eliquis    Ovarian mass -Needs GYN follow-up  Elevated alkaline phosphatase - Needs follow-up   DVT prophylaxis: Apixaban  Code Status: DNR Family Communication: None present Disposition Plan: Home tomorrow  Consultants:    Procedures:   Antimicrobials:    Objective: Vitals:   01/07/24 1543 01/07/24 2021 01/08/24 0400  01/08/24 0806  BP: 132/83 131/80 (!) 130/93 123/86  Pulse: 92 91 81 86  Resp: 20 17    Temp: 97.9 F (36.6 C) 98.2 F (36.8 C) 97.8 F (36.6 C) 98.2 F (36.8 C)  TempSrc:   Oral Oral  SpO2: 99% 97% 95% 99%    Intake/Output Summary (Last 24 hours) at 01/08/2024 1207 Last data filed at 01/08/2024 9191 Gross per 24 hour  Intake 120 ml  Output --  Net 120 ml   There were no vitals filed for this visit.  Examination:  General exam: Obese, AAO x 3, no distress Respiratory system improved air movement Cardiovascular system: S1 & S2 heard, irregular Abd: nondistended, soft and nontender.Normal bowel sounds heard. Central nervous system: Alert and oriented. No focal neurological deficits. Extremities: Trace  edema, erythema and swelling improving, skin breakdown: Overall improving Skin: As above Psychiatry:  Mood & affect appropriate.     Data Reviewed:   CBC: Recent Labs  Lab 01/04/24 0832 01/04/24 0850 01/05/24 0416 01/06/24 0652  WBC  --  13.4* 7.7 6.6  HGB 13.3 11.7* 10.9* 11.1*  HCT 39.0 38.3 35.4* 35.8*  MCV  --  90.3 90.3 89.1  PLT  --  278 284 284   Basic Metabolic Panel: Recent Labs  Lab 01/04/24 0832 01/04/24 0850 01/05/24 0416 01/06/24 0652 01/08/24 0344  NA 138 137 137 140 140  K 5.3* 5.5* 4.8 3.6 4.1  CL 111 106 108 105 105  CO2  --  16* 20* 27 26  GLUCOSE 85 82 90 89 91  BUN 24* 21 23 24* 21  CREATININE 0.90 0.90 1.10* 1.21* 0.97  CALCIUM   --  10.1  9.8 9.2 9.3   GFR: CrCl cannot be calculated (Unknown ideal weight.). Liver Function Tests: Recent Labs  Lab 01/04/24 0850 01/05/24 0416  AST 44* 30  ALT 20 14  ALKPHOS 251* 204*  BILITOT 1.2 0.5  PROT 6.7 5.8*  ALBUMIN  3.7 3.3*   No results for input(s): LIPASE, AMYLASE in the last 168 hours. No results for input(s): AMMONIA in the last 168 hours. Coagulation Profile: Recent Labs  Lab 01/04/24 0850  INR 1.0   Cardiac Enzymes: No results for input(s): CKTOTAL, CKMB,  CKMBINDEX, TROPONINI in the last 168 hours. BNP (last 3 results) Recent Labs    10/11/23 0935 10/12/23 0458 01/04/24 0850  PROBNP 4,647.0* 4,823.0* 2,651.0*   HbA1C: No results for input(s): HGBA1C in the last 72 hours. CBG: No results for input(s): GLUCAP in the last 168 hours. Lipid Profile: No results for input(s): CHOL, HDL, LDLCALC, TRIG, CHOLHDL, LDLDIRECT in the last 72 hours. Thyroid Function Tests: No results for input(s): TSH, T4TOTAL, FREET4, T3FREE, THYROIDAB in the last 72 hours. Anemia Panel: No results for input(s): VITAMINB12, FOLATE, FERRITIN, TIBC, IRON , RETICCTPCT in the last 72 hours. Urine analysis:    Component Value Date/Time   COLORURINE YELLOW 01/04/2024 0945   APPEARANCEUR CLEAR 01/04/2024 0945   LABSPEC 1.025 01/04/2024 0945   PHURINE 5.0 01/04/2024 0945   GLUCOSEU NEGATIVE 01/04/2024 0945   HGBUR NEGATIVE 01/04/2024 0945   BILIRUBINUR NEGATIVE 01/04/2024 0945   KETONESUR 5 (A) 01/04/2024 0945   PROTEINUR 100 (A) 01/04/2024 0945   NITRITE NEGATIVE 01/04/2024 0945   LEUKOCYTESUR TRACE (A) 01/04/2024 0945   Sepsis Labs: @LABRCNTIP (procalcitonin:4,lacticidven:4)  ) Recent Results (from the past 240 hours)  Culture, blood (routine x 2)     Status: None (Preliminary result)   Collection Time: 01/04/24  8:37 AM   Specimen: BLOOD LEFT ARM  Result Value Ref Range Status   Specimen Description BLOOD LEFT ARM  Final   Special Requests   Final    AEROBIC BOTTLE ONLY Blood Culture results may not be optimal due to an inadequate volume of blood received in culture bottles   Culture   Final    NO GROWTH 4 DAYS Performed at [facility], [address], [place], [place] [address]    Report Status PENDING  Incomplete  Culture, blood (routine x 2)     Status: None (Preliminary result)   Collection Time: 01/04/24  8:42 AM   Specimen: BLOOD RIGHT ARM  Result Value Ref Range Status   Specimen Description  BLOOD RIGHT ARM  Final   Special Requests   Final    BOTTLES DRAWN AEROBIC AND ANAEROBIC Blood Culture results may not be optimal due to an inadequate volume of blood received in culture bottles   Culture   Final    NO GROWTH 4 DAYS Performed at [facility], [address] [place], [place] [address]    Report Status PENDING  Incomplete     Radiology Studies: No results found.    Scheduled Meds:  apixaban   5 mg Oral BID   cephALEXin   250 mg Oral Q8H   furosemide   40 mg Oral Daily   metoprolol  succinate  50 mg Oral Daily   pantoprazole   40 mg Oral Daily   spironolactone   25 mg Oral Daily   zinc  sulfate (50mg  elemental zinc )  220 mg Oral Daily   Continuous Infusions:     LOS: 4 days    Time spent:    Moyses Pavey  Fairy, MD [facility]   01/08/2024, 12:07 PM    "

## 2024-01-09 ENCOUNTER — Encounter (HOSPITAL_COMMUNITY): Payer: Self-pay | Admitting: Internal Medicine

## 2024-01-09 ENCOUNTER — Other Ambulatory Visit: Payer: Self-pay

## 2024-01-09 DIAGNOSIS — L03115 Cellulitis of right lower limb: Secondary | ICD-10-CM | POA: Diagnosis not present

## 2024-01-09 LAB — BASIC METABOLIC PANEL WITH GFR
Anion gap: 10 (ref 5–15)
BUN: 21 mg/dL (ref 8–23)
CO2: 25 mmol/L (ref 22–32)
Calcium: 9.6 mg/dL (ref 8.9–10.3)
Chloride: 104 mmol/L (ref 98–111)
Creatinine, Ser: 1.02 mg/dL — ABNORMAL HIGH (ref 0.44–1.00)
GFR, Estimated: 59 mL/min — ABNORMAL LOW
Glucose, Bld: 102 mg/dL — ABNORMAL HIGH (ref 70–99)
Potassium: 3.7 mmol/L (ref 3.5–5.1)
Sodium: 139 mmol/L (ref 135–145)

## 2024-01-09 LAB — CULTURE, BLOOD (ROUTINE X 2)
Culture: NO GROWTH
Culture: NO GROWTH

## 2024-01-09 MED ORDER — SPIRONOLACTONE 25 MG PO TABS
25.0000 mg | ORAL_TABLET | Freq: Every day | ORAL | 0 refills | Status: AC
  Filled 2024-01-09: qty 30, 30d supply, fill #0

## 2024-01-09 MED ORDER — CEPHALEXIN 250 MG PO CAPS
250.0000 mg | ORAL_CAPSULE | Freq: Three times a day (TID) | ORAL | 0 refills | Status: AC
  Filled 2024-01-09: qty 12, 4d supply, fill #0

## 2024-01-09 NOTE — TOC Transition Note (Signed)
 Transition of Care [facility]) - Discharge Note   Patient Details  Name: Christine Jacobson MRN: [id number] Date of Birth: 1955/07/20  [facility] CM/SW Contact:  Waddell Barnie Rama, RN Phone Number: 01/09/2024, 11:51 AM   Clinical Narrative:    For dc today, per previous note patient wants [facility].  NCM sent referral to [facility], they are able to take referral. Soc will begin 24 to 48 hrs post dc.      Barriers to Discharge: Continued Medical Work up   Patient Goals and CMS Choice   CMS Medicare.gov Compare Post Acute Care list provided to:: Patient Choice offered to / list presented to : Patient      Discharge Placement                       Discharge Plan and Services Additional resources added to the After Visit Summary for   In-house Referral: Clinical Social Work, Orthoptist                                   Social Drivers of Health (SDOH) Interventions SDOH Screenings   Food Insecurity: No Food Insecurity (01/04/2024)  Housing: Low Risk (01/04/2024)  Transportation Needs: No Transportation Needs (01/04/2024)  Utilities: Not At Risk (01/04/2024)  Depression (PHQ2-9): Low Risk (10/26/2021)  Financial Resource Strain: Low Risk (12/03/2021)  Social Connections: Moderately Isolated (01/04/2024)  Tobacco Use: Medium Risk (11/21/2023)     Readmission Risk Interventions    11/16/2023   10:02 AM 05/12/2023    4:15 PM 12/28/2022   11:28 AM  Readmission Risk Prevention Plan  Transportation Screening Complete Complete Complete  PCP or Specialist Appt within 3-5 Days Complete  Complete  HRI or Home Care Consult Complete  Complete  Social Work Consult for Recovery Care Planning/Counseling Complete  Complete  Palliative Care Screening Not Applicable  Not Applicable  Medication Review Oceanographer)  Complete Complete  HRI or Home Care Consult  Complete   Palliative Care Screening  Not Applicable   Skilled Nursing Facility  Not Applicable

## 2024-01-09 NOTE — Progress Notes (Signed)
 Pt continues to refuse telemetry even after education. Will continue to educate and attempt to place. Christine Sieving NP informed of pt refusal

## 2024-01-09 NOTE — Discharge Summary (Addendum)
 Physician Discharge Summary  Christine Jacobson FMW:[id number] DOB: 09/09/1955 DOA: 01/04/2024  PCP: [name], NP  Admit date: 01/04/2024 Discharge date: 01/09/2024  Time spent: 45 minutes  Recommendations for Outpatient Follow-up:  PCP in 1 week [facility] in 1 to 2 months   Discharge Diagnoses:  Right lower extremity cellulitis Sepsis, lactic acidosis Acute on chronic diastolic CHF   HOCM (hypertrophic obstructive cardiomyopathy) (HCC)   Pulmonary HTN (HCC)   Atrial fibrillation with RVR (HCC)   Ovarian mass   (HFpEF) heart failure with preserved ejection fraction [facility])   Discharge Condition: Improved  Diet recommendation: Low-sodium, heart healthy  There were no vitals filed for this visit.  History of present illness:  Christine Jacobson is a chronically ill obese female with history of diastolic CHF, HOCM, persistent A-fib on Eliquis , ovarian mass, history of recurrent cellulitis presented to the ED with weakness and fall.  Frail at baseline, weaker in the last few days, had pain redness and swelling in her right leg, had some difficulty getting up from the couch and then subsequently fell on the ground, landed on her right buttock, subsequently called EMS and presented to the ED. ED Course: Afebrile, persistent A-fib with RVR, labs noted lactic acidosis, proBNP 2651, potassium 5.5, creatinine 0.9, WBC 13.4 -Also noted to be volume overloaded, started on diuretics  Hospital Course:   Sepsis, POA Right lower extremity cellulitis - Improving on IV ceftriaxone , switch to oral Keflex  for 4 more days - Cultures negative thus far - Continue wound care -Ambulating fairly well, wants to go home instead of rehab, add home health PT and RN  - Follow-up with PCP in 1 week   Acute on chronic diastolic CHF Pulmonary hypertension HOCM - Edematous, fairly obese, volume status difficult to assess - Diuresed with IV Lasix  briefly, volume status has improved,  switch to oral Lasix , continue Toprol  and Aldactone    Hyperkalemia Resolved   Persistent atrial fibrillation with RVR -Heart rate higher at this time, likely driven by infectious process, now improved -Continue Toprol  50 mg, continue Eliquis    Ovarian mass -Needs GYN follow-up   Elevated alkaline phosphatase - Needs follow-up  Discharge Exam: Vitals:   01/09/24 0300 01/09/24 0756  BP: 118/71 127/77  Pulse: 79 84  Resp: 17 16  Temp: 98.1 F (36.7 C) (!) 97.4 F (36.3 C)  SpO2: 98% 98%    General exam: Obese, AAO x 3, no distress Respiratory system improved air movement Cardiovascular system: S1 & S2 heard, irregular Abd: nondistended, soft and nontender.Normal bowel sounds heard. Central nervous system: Alert and oriented. No focal neurological deficits. Extremities: Trace  edema, erythema and swelling improving, skin breakdown: Overall improving Skin: As above Psychiatry:  Mood & affect appropriate.    Discharge Instructions   Discharge Instructions     Discharge wound care:   Complete by: As directed    RLE: Cleanse with Vashe #[id number], allow to dry, not rinse. Apply a single layer of Xeroform to the wound bed, top with non adherent gauze. Moist the intact skin with barrier cream. Wrap with Kerlix gauze and ACE wrap. Starting from the base of the toes to the distal notch of knee, including the heels. Change daily.   Increase activity slowly   Complete by: As directed       Allergies as of 01/09/2024       Reactions   Porcine (pork) Protein-containing Drug Products Other (See Comments)   Religious preference  Medication List     TAKE these medications    acetaminophen  325 MG tablet Commonly known as: TYLENOL  Take 2 tablets (650 mg total) by mouth every 6 (six) hours as needed for mild pain (pain score 1-3) or fever (or Fever >/= 101).   allopurinol  100 MG tablet Commonly known as: ZYLOPRIM  Take 100 mg by mouth every morning.   apixaban  5 MG  Tabs tablet Commonly known as: Eliquis  Take 1 tablet (5 mg total) by mouth 2 (two) times daily. NEEDS CARDIOLOGY APPT FOR REFILLS, CALL OFFICE [phone].  THANK YOU   atorvastatin  80 MG tablet Commonly known as: LIPITOR  Take 1 tablet (80 mg total) by mouth daily.   cephALEXin  250 MG capsule Commonly known as: KEFLEX  Take 1 capsule (250 mg total) by mouth every 8 (eight) hours for 4 days.   ezetimibe  10 MG tablet Commonly known as: Zetia  Take 1 tablet (10 mg total) by mouth daily for cholesterol.   furosemide  40 MG tablet Commonly known as: Lasix  Take 1 tablet (40 mg total) by mouth in the morning.   hydrocortisone  cream 1 % Apply 1 Application topically 3 (three) times daily as needed for itching (minor skin irritation).   metoprolol  succinate 50 MG 24 hr tablet Commonly known as: TOPROL -XL Take 1 tablet (50 mg total) by mouth daily. Take with or immediately following a meal. What changed: Another medication with the same name was removed. Continue taking this medication, and follow the directions you see here.   pantoprazole  40 MG tablet Commonly known as: PROTONIX  Take 1 tablet (40 mg total) by mouth daily.   Potassium Chloride  ER 20 MEQ Tbcr Take 1 tablet (20 mEq total) by mouth every other day.   spironolactone  25 MG tablet Commonly known as: ALDACTONE  Take 1 tablet (25 mg total) by mouth daily. Start taking on: January 10, 2024   Zinc  Sulfate 220 (50 Zn) MG Tabs Take 1 tablet (220 mg total) by mouth daily for 30 days               Discharge Care Instructions  (From admission, onward)           Start     Ordered   01/09/24 0000  Discharge wound care:       Comments: RLE: Cleanse with Vashe #[id number], allow to dry, not rinse. Apply a single layer of Xeroform to the wound bed, top with non adherent gauze. Moist the intact skin with barrier cream. Wrap with Kerlix gauze and ACE wrap. Starting from the base of the toes to the distal notch of knee,  including the heels. Change daily.   01/09/24 1142           Allergies[1]    The results of significant diagnostics from this hospitalization (including imaging, microbiology, ancillary and laboratory) are listed below for reference.    Significant Diagnostic Studies: DG Pelvis Portable Result Date: 01/04/2024 CLINICAL DATA:  Trauma EXAM: DG PORTABLE PELVIS COMPARISON:  None Available. 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 Mild left hip osteoarthritis. Multilevel degenerative disc disease of the spine. Soft tissues are unremarkable. IMPRESSION: No acute fracture, pelvic bone diastasis, or dislocation. Electronically Signed   By: Rogelia Myers M.D.   On: 01/04/2024 08:07   DG Chest Port 1 View Result Date: 01/04/2024 EXAM: 1 VIEW(S) XRAY OF THE CHEST 01/04/2024 07:46:00 AM COMPARISON: 11/21/2023 CLINICAL HISTORY: Trauma FINDINGS: LUNGS AND PLEURA: No focal pulmonary opacity.  No pleural effusion. No pneumothorax. HEART AND MEDIASTINUM: Cardiomegaly, unchanged. BONES AND SOFT TISSUES: No acute osseous abnormality. IMPRESSION: 1. No acute process. Electronically signed by: Waddell Calk MD 01/04/2024 08:06 AM EST RP Workstation: [id number]    Microbiology: Recent Results (from the past 240 hours)  Culture, blood (routine x 2)     Status: None   Collection Time: 01/04/24  8:37 AM   Specimen: BLOOD LEFT ARM  Result Value Ref Range Status   Specimen Description BLOOD LEFT ARM  Final   Special Requests   Final    AEROBIC BOTTLE ONLY Blood Culture results may not be optimal due to an inadequate volume of blood received in culture bottles   Culture   Final    NO GROWTH 5 DAYS Performed at [facility], [address], [place], [place] [address]    Report Status 01/09/2024 FINAL  Final  Culture, blood (routine x 2)     Status: None   Collection Time: 01/04/24  8:42  AM   Specimen: BLOOD RIGHT ARM  Result Value Ref Range Status   Specimen Description BLOOD RIGHT ARM  Final   Special Requests   Final    BOTTLES DRAWN AEROBIC AND ANAEROBIC Blood Culture results may not be optimal due to an inadequate volume of blood received in culture bottles   Culture   Final    NO GROWTH 5 DAYS Performed at [facility], [address] [place], [place] [address]    Report Status 01/09/2024 FINAL  Final     Labs: Basic Metabolic Panel: Recent Labs  Lab 01/04/24 0850 01/05/24 0416 01/06/24 0652 01/08/24 0344 01/09/24 0441  NA 137 137 140 140 139  K 5.5* 4.8 3.6 4.1 3.7  CL 106 108 105 105 104  CO2 16* 20* 27 26 25   GLUCOSE 82 90 89 91 102*  BUN 21 23 24* 21 21  CREATININE 0.90 1.10* 1.21* 0.97 1.02*  CALCIUM  10.1 9.8 9.2 9.3 9.6   Liver Function Tests: Recent Labs  Lab 01/04/24 0850 01/05/24 0416  AST 44* 30  ALT 20 14  ALKPHOS 251* 204*  BILITOT 1.2 0.5  PROT 6.7 5.8*  ALBUMIN  3.7 3.3*   No results for input(s): LIPASE, AMYLASE in the last 168 hours. No results for input(s): AMMONIA in the last 168 hours. CBC: Recent Labs  Lab 01/04/24 0832 01/04/24 0850 01/05/24 0416 01/06/24 0652  WBC  --  13.4* 7.7 6.6  HGB 13.3 11.7* 10.9* 11.1*  HCT 39.0 38.3 35.4* 35.8*  MCV  --  90.3 90.3 89.1  PLT  --  278 284 284   Cardiac Enzymes: No results for input(s): CKTOTAL, CKMB, CKMBINDEX, TROPONINI in the last 168 hours. BNP: BNP (last 3 results) Recent Labs    05/11/23 0306 11/13/23 0920 11/21/23 1215  BNP 551.0* 915.1* 741.8*    ProBNP (last 3 results) Recent Labs    10/11/23 0935 10/12/23 0458 01/04/24 0850  PROBNP 4,647.0* 4,823.0* 2,651.0*    CBG: No results for input(s): GLUCAP in the last 168 hours.     Signed:  Sigurd Pac MD.  [facility] 01/09/2024, 11:43 AM        [1]  Allergies Allergen Reactions   Porcine (Pork) Protein-Containing Drug Products Other (See Comments)     Religious preference

## 2024-01-09 NOTE — Progress Notes (Signed)
 Physical Therapy Treatment Patient Details Name: Christine Jacobson MRN: [id number] DOB: [birth year] Today's Date: 01/09/2024   History of Present Illness Pt is a 68 y.o female admitted 12/18 for fall at home. Pain in RLE, imaging negative for fx. Pt found to have right lower extremity cellulitis. Note symptomatic orthostatic hypotension during PT session 12/23, RN/MD notified.  PMH: obese, CHF, afib, ovarian mass, chronic cellulitis.    PT Comments  Pt received in supine, anxious and perseverating on DC today but reporting she is not ready for this due to nausea. Pt agreeable to transfer training and vitals assessment to see if this is related to her symptoms. Pt noted to have symptomatic orthostatic hypotension from supine to standing postures, pt unable to stand for second standing BP after 3 minutes due to increased dizziness/nausea after initial standing BP taken. RN/MD notified of pt symptoms and request for another day in hospital prior to DC. Patient will benefit from continued inpatient follow up therapy, <3 hours/day however likely to refuse SNF, in which case recommend max HH services to work on home safety and safe mobility progression.  Orthostatic Lying   BP- Lying (!) 140/94  Pulse- Lying 80  Orthostatic Sitting  BP- Sitting (!) 124/93 (mildly nauseated/dizzy)  Pulse- Sitting 86  Orthostatic Standing at 0 minutes  BP- Standing at 0 minutes 117/79 (nausea/dizzy)  Pulse- Standing at 0 minutes 100  Oxygen Therapy  SpO2 99 %  O2 Device Room Air  Patient Activity (if Appropriate) In bed       If plan is discharge home, recommend the following: Assist for transportation;Assistance with cooking/housework;A little help with walking and/or transfers;A little help with bathing/dressing/bathroom   Can travel by private vehicle     No  Equipment Recommendations  None recommended by PT    Recommendations for Other Services       Precautions / Restrictions  Precautions Precautions: Fall Recall of Precautions/Restrictions: Impaired Precaution/Restrictions Comments: watch BP, orthostatic on 12/23 Restrictions Weight Bearing Restrictions Per Provider Order: No     Mobility  Bed Mobility Overal bed mobility: Needs Assistance Bed Mobility: Supine to Sit, Sit to Supine     Supine to sit: Supervision, Used rails Sit to supine: Supervision, Used rails   General bed mobility comments: cues for safety and self-monitoring for symptoms.    Transfers Overall transfer level: Needs assistance Equipment used: Rolling walker (2 wheels) Transfers: Sit to/from Stand Sit to Stand: Contact guard assist           General transfer comment: CGA for safety. PTA holding RW for safety, cues to push up from bed with one hand as pt attempting to maintain BUE on RW when standing. Pt denies need for BSC/toileting prior to return to supine.    Ambulation/Gait               General Gait Details: pt too dizzy/nauseated to ambulate during session   Stairs             Wheelchair Mobility     Tilt Bed    Modified Rankin (Stroke Patients Only)       Balance Overall balance assessment: Needs assistance Sitting-balance support: Feet supported, No upper extremity supported Sitting balance-Leahy Scale: Good     Standing balance support: Bilateral upper extremity supported, During functional activity, Reliant on assistive device for balance Standing balance-Leahy Scale: Poor Standing balance comment: Dependent on RW  Communication Communication Communication: Impaired Factors Affecting Communication: Difficulty expressing self  Cognition Arousal: Alert Behavior During Therapy: Flat affect, Anxious   PT - Cognitive impairments: No family/caregiver present to determine baseline, Difficult to assess, Safety/Judgement, Problem solving, Sequencing, Initiation, Attention                        PT - Cognition Comments: Pt perseverating on upcoming discharge and c/o nausea and dizziness today limiting her, MD/RN notified pt also found to be orthostatic. Pt adamant that she will not try BLE compression socks/TED hose or abdominal binder to help BP stability, RN/MD notified. Pt lives alone and states she will call a taxi to take her home when she does DC. Following commands: Impaired      Cueing Cueing Techniques: Verbal cues, Gestural cues  Exercises      General Comments General comments (skin integrity, edema, etc.): BP 140/94 (107) HR 80 bpm supine; BP 117/79 (93) HR 100 bpm standing, dizzy/nausea, RN/MD notified of pt symptomatic drop in BP with postural changes. Pt encouraged to sit in chair but she defers at time of attempt.      Pertinent Vitals/Pain Pain Assessment Pain Assessment: 0-10 Pain Score: 5  Pain Location: RLE Pain Descriptors / Indicators: Discomfort, Grimacing Pain Intervention(s): Limited activity within patient's tolerance, Monitored during session, Repositioned    Home Living                          Prior Function            PT Goals (current goals can now be found in the care plan section) Acute Rehab PT Goals Patient Stated Goal: To wait another day until I'm not nauseated then go home. PT Goal Formulation: Patient unable to participate in goal setting Time For Goal Achievement: 01/19/24 Progress towards PT goals: Not progressing toward goals - comment    Frequency    Min 2X/week      PT Plan      Co-evaluation              AM-PAC PT 6 Clicks Mobility   Outcome Measure  Help needed turning from your back to your side while in a flat bed without using bedrails?: None Help needed moving from lying on your back to sitting on the side of a flat bed without using bedrails?: None Help needed moving to and from a bed to a chair (including a wheelchair)?: A Little Help needed standing up from a chair using your arms  (e.g., wheelchair or bedside chair)?: A Little Help needed to walk in hospital room?: A Little Help needed climbing 3-5 steps with a railing? : Total 6 Click Score: 18    End of Session Equipment Utilized During Treatment:  (pt refusing gait belt) Activity Tolerance: Other (comment);Treatment limited secondary to medical complications (Comment) (symptomatic orthostatic hypotension limiting standing tolerance) Patient left: in bed;with call bell/phone within reach;with bed alarm set;Other (comment) (pt agreeable to PTA getting her some ice water  to drink to try to hydrate) Nurse Communication: Mobility status;Other (comment);Precautions (symptomatic drop in BP with standing; pt reports she is not ready to DC yet.) PT Visit Diagnosis: Unsteadiness on feet (R26.81);Other abnormalities of gait and mobility (R26.89);Muscle weakness (generalized) (M62.81);Pain Pain - part of body: Leg     Time: 1253-1306 PT Time Calculation (min) (ACUTE ONLY): 13 min  Charges:    $Therapeutic Activity: 8-22 mins PT General Charges $$ ACUTE  PT VISIT: 1 Visit                     Zamia Tyminski P., PTA Acute Rehabilitation Services Secure Chat Preferred 9a-5:30pm Office: [phone]    [facility] 01/09/2024, 1:44 PM

## 2024-01-09 NOTE — Progress Notes (Signed)
 Addendum: reported to have dizziness and nausea after ambulation, checking orthostatics Will hold DC today and plan for tomorrow if stable, already got diuretics today, hold tomorrows dose and resume 1day after DC  Sigurd Pac, MD

## 2024-01-09 NOTE — Progress Notes (Signed)
 Physical Therapy Treatment Patient Details Name: Christine Jacobson MRN: [id number] DOB: 10-Aug-1955 Today's Date: 01/09/2024   History of Present Illness Pt is a 68 y.o female admitted 12/18 for fall at home. Pain in RLE, imaging negative for fx. Pt found to have right lower extremity cellulitis. Note symptomatic orthostatic hypotension during PT session 12/23, RN/MD notified.  PMH: obese, CHF, afib, ovarian mass, chronic cellulitis.    PT Comments  Pt received with bed alarm sounding and pt getting up to RW to ambulate to bathroom, pt agreeable to PTA assisting her with Supervision to CGA for safety as pt still with dizziness/nausea with standing activity, no nursing staff present who can assist at time of second session. PTA reinforced fall risk prevention in hospital and need to use call bell and wait for assist prior to getting up to bathroom or ambulating. Pt agreeable to transfer to chair once done toileting, chair alarm placed for pt safety and activated, RN notified pt still symptomatic when standing and pt unable to perform typical household distance gait trials at this time due to dizziness/nausea, still recommend <3 hours per day post-acute rehab at skilled facility, though pt likely to refuse.     If plan is discharge home, recommend the following: Assist for transportation;Assistance with cooking/housework;A little help with walking and/or transfers;A little help with bathing/dressing/bathroom   Can travel by private vehicle     No  Equipment Recommendations  None recommended by PT    Recommendations for Other Services       Precautions / Restrictions Precautions Precautions: Fall Recall of Precautions/Restrictions: Impaired Precaution/Restrictions Comments: watch BP, orthostatic on 12/23 Restrictions Weight Bearing Restrictions Per Provider Order: No     Mobility  Bed Mobility Overal bed mobility: Needs Assistance Bed Mobility: Supine to Sit     Supine to sit: Used  rails, Modified independent (Device/Increase time) Sit to supine: Supervision, Used rails   General bed mobility comments: pt got herself up OOB unassisted, ambulating to bathroom as PTA arrived to her room    Transfers Overall transfer level: Needs assistance Equipment used: Rolling walker (2 wheels) Transfers: Sit to/from Stand Sit to Stand: Supervision           General transfer comment: cues for safety when standing from toilet and to chair, to reach back prior to sitting; pt tends to keep hands on RW when standing when not cued.    Ambulation/Gait Ambulation/Gait assistance: Contact guard assist, Min assist Gait Distance (Feet): 15 Feet (69ft, seated break on toilet, 32ft) Assistive device: Rolling walker (2 wheels) Gait Pattern/deviations: Narrow base of support, Step-to pattern, Shuffle Gait velocity: decreased     General Gait Details: cues for RW proximity and safety navigating in narrow spaces of room; pt self-limiting distance due to nausea/dizziness. PTA CGA for safety in bathroom and assisting her to navigate around large bathroom door to get to chair.   Stairs             Wheelchair Mobility     Tilt Bed    Modified Rankin (Stroke Patients Only)       Balance Overall balance assessment: Needs assistance Sitting-balance support: Feet supported, No upper extremity supported Sitting balance-Leahy Scale: Good     Standing balance support: Bilateral upper extremity supported, During functional activity, Reliant on assistive device for balance Standing balance-Leahy Scale: Poor Standing balance comment: Dependent on RW support  Communication Communication Communication: Impaired Factors Affecting Communication: Difficulty expressing self  Cognition Arousal: Alert Behavior During Therapy: Flat affect, Anxious   PT - Cognitive impairments: No family/caregiver present to determine baseline, Difficult to  assess, Safety/Judgement, Problem solving, Sequencing, Initiation, Attention                       PT - Cognition Comments: Pt impulsively transferring OOB using RW and ambulating to bathroom as PTA arrived to her room due to bed alarm sounding. PTA reinforced with pt that due to her symptoms of dizziness and drop in BP when standing, she will need to request assist for OOB at this time, to reduce risk of falls/injury, pt receptive but will need reinforcement likely. Chair alarm placed for pt safety as pt now agrees to try sitting up in recliner and RN notified. Following commands: Impaired      Cueing Cueing Techniques: Verbal cues, Gestural cues  Exercises      General Comments General comments (skin integrity, edema, etc.): Still dizzy so defer longer distance gait trial; chair alarm on for pt safety, tray table and belongings within her reach per patient request.      Pertinent Vitals/Pain Pain Assessment Pain Assessment: 0-10 Pain Score: 5  Pain Location: RLE Pain Descriptors / Indicators: Discomfort, Grimacing Pain Intervention(s): Limited activity within patient's tolerance, Monitored during session, Repositioned    Home Living                          Prior Function            PT Goals (current goals can now be found in the care plan section) Acute Rehab PT Goals Patient Stated Goal: To wait another day until I'm not nauseated then go home. PT Goal Formulation: Patient unable to participate in goal setting Time For Goal Achievement: 01/19/24 Progress towards PT goals: Progressing toward goals (slowly)    Frequency    Min 2X/week      PT Plan      Co-evaluation              AM-PAC PT 6 Clicks Mobility   Outcome Measure  Help needed turning from your back to your side while in a flat bed without using bedrails?: None Help needed moving from lying on your back to sitting on the side of a flat bed without using bedrails?: None Help  needed moving to and from a bed to a chair (including a wheelchair)?: A Little Help needed standing up from a chair using your arms (e.g., wheelchair or bedside chair)?: A Little Help needed to walk in hospital room?: A Little Help needed climbing 3-5 steps with a railing? : Total 6 Click Score: 18    End of Session Equipment Utilized During Treatment:  (pt refusing gait belt) Activity Tolerance: Other (comment);Treatment limited secondary to medical complications (Comment) (symptomatic orthostatic hypotension limiting gait distance) Patient left: with call bell/phone within reach;Other (comment);in chair;with chair alarm set (pillow under LE in recliner to elevate them slightly; pt encouraged to sit up 1-2 hours) Nurse Communication: Mobility status;Other (comment);Precautions (dizzy/lightheaded ambulating to bathroom, nausea) PT Visit Diagnosis: Unsteadiness on feet (R26.81);Other abnormalities of gait and mobility (R26.89);Muscle weakness (generalized) (M62.81);Pain Pain - part of body: Leg     Time: 8679-8662 PT Time Calculation (min) (ACUTE ONLY): 17 min  Charges:    $Gait Training: 8-22 mins $Therapeutic Activity: 8-22 mins PT General Charges $$ ACUTE PT  VISIT: 1 Visit                     Christine Valdivia P., PTA Acute Rehabilitation Services Secure Chat Preferred 9a-5:30pm Office: [phone]    [facility] 01/09/2024, 1:55 PM

## 2024-01-10 DIAGNOSIS — I5033 Acute on chronic diastolic (congestive) heart failure: Secondary | ICD-10-CM

## 2024-01-10 NOTE — Progress Notes (Signed)
 Patient visibly upset but unwilling to say why, refusing to wear a clean gown put on her dirty clothes that she brought with her. Attempted to sit with her but seemed to add to her discomfort.

## 2024-01-10 NOTE — Plan of Care (Signed)

## 2024-01-10 NOTE — Plan of Care (Signed)

## 2024-01-10 NOTE — Progress Notes (Signed)
 Occupational Therapy Treatment Patient Details Name: Christine Jacobson MRN: [id number] DOB: [birth year] Today's Date: 01/10/2024   History of present illness Pt is a 68 y.o female admitted 12/18 for fall at home. Pain in RLE, imaging negative for fx. Pt found to have right lower extremity cellulitis. Note symptomatic orthostatic hypotension during PT session 12/23, RN/MD notified.  PMH: obese, CHF, afib, ovarian mass, chronic cellulitis.   OT comments  Pt has made significant improvement and is overall modified independent with mobility and ADL tasks @ RW level. Attempted to educate pt on strategies to reduce risk for falls and recommend fall alert system as pt lives alone. Recommend HHOT to maximize independence with IADL tasks and reduce risk of falls. Anticipate DC today. Will follow acutely.       If plan is discharge home, recommend the following:  A little help with walking and/or transfers;A lot of help with bathing/dressing/bathroom;Assistance with cooking/housework;Direct supervision/assist for medications management;Assist for transportation;Help with stairs or ramp for entrance   Equipment Recommendations  Other (comment)    Recommendations for Other Services      Precautions / Restrictions Precautions Precautions: Fall Recall of Precautions/Restrictions: Impaired Precaution/Restrictions Comments: watch BP, orthostatic on 12/23 Restrictions Weight Bearing Restrictions Per Provider Order: No       Mobility Bed Mobility               General bed mobility comments: OOB in chair    Transfers Overall transfer level: Modified independent Equipment used: Rolling walker (2 wheels)                     Balance Overall balance assessment: Needs assistance Sitting-balance support: Feet supported, No upper extremity supported Sitting balance-Leahy Scale: Good     Standing balance support: Bilateral upper extremity supported, During functional activity,  Reliant on assistive device for balance Standing balance-Leahy Scale: Fair Standing balance comment: Dependent on RW support                           ADL either performed or assessed with clinical judgement   ADL Overall ADL's : Needs assistance/impaired                                       General ADL Comments: overall modified independent withADL tasks; Educated on stratgies to reduce risk of falls; attempted to educate on home safety with home management and item transport @ RW level.    Extremity/Trunk Assessment Upper Extremity Assessment Upper Extremity Assessment: Generalized weakness   Lower Extremity Assessment Lower Extremity Assessment: Defer to PT evaluation        Vision       Perception     Praxis     Communication Communication Communication: Impaired Factors Affecting Communication: Difficulty expressing self (esp when standing)   Cognition Arousal: Alert Behavior During Therapy: Flat affect, Agitated Cognition: No family/caregiver present to determine baseline (impaired reasoning)                               Following commands: Intact        Cueing   Cueing Techniques: Verbal cues, Gestural cues  Exercises Exercises:  (pt not receptive due to symptoms)    Shoulder Instructions       General Comments able to retrieve items form  floor without LOB; Discussed last fall and recommendation for fall alert system    Pertinent Vitals/ Pain       Pain Assessment Pain Assessment: Faces Faces Pain Scale: Hurts a little bit Pain Location: no specific c/o, but appears to grimace when ascending step with RLE Pain Descriptors / Indicators: Grimacing Pain Intervention(s): Limited activity within patient's tolerance  Home Living                                          Prior Functioning/Environment              Frequency  Min 2X/week        Progress Toward Goals  OT  Goals(current goals can now be found in the care plan section)  Progress towards OT goals: Goals met/education completed, patient discharged from OT (follow up with HHOT)  Acute Rehab OT Goals Patient Stated Goal: home OT Goal Formulation: With patient Time For Goal Achievement: 01/20/24 Potential to Achieve Goals: Good ADL Goals Pt Will Perform Lower Body Bathing: with modified independence;with adaptive equipment Pt Will Perform Lower Body Dressing: with min assist;sitting/lateral leans Pt Will Transfer to Toilet: with modified independence;ambulating;bedside commode Additional ADL Goal #1: Pt will engage in cognitive pill box assessment.  Plan      Co-evaluation                 AM-PAC OT 6 Clicks Daily Activity     Outcome Measure   Help from another person eating meals?: None Help from another person taking care of personal grooming?: None Help from another person toileting, which includes using toliet, bedpan, or urinal?: None Help from another person bathing (including washing, rinsing, drying)?: None Help from another person to put on and taking off regular upper body clothing?: None Help from another person to put on and taking off regular lower body clothing?: None 6 Click Score: 24    End of Session Equipment Utilized During Treatment: Rolling walker (2 wheels)  OT Visit Diagnosis: Unsteadiness on feet (R26.81);Muscle weakness (generalized) (M62.81);History of falling (Z91.81);Other symptoms and signs involving cognitive function;Pain   Activity Tolerance Patient tolerated treatment well   Patient Left in chair;with call bell/phone within reach;with chair alarm set   Nurse Communication Mobility status        Time: 1332 [phone] OT Time Calculation (min): 12 min  Charges: OT General Charges $OT Visit: 1 Visit OT Treatments $Self Care/Home Management : 8-22 mins  Kreg Sink, OT/L   Acute OT Clinical Specialist Acute Rehabilitation  Services Pager [phone] Office [phone]   [facility] 01/10/2024, 1:57 PM

## 2024-01-10 NOTE — Discharge Summary (Signed)
 Physician Discharge Summary  Wanna Gully FMW:[id number] DOB: 17-Jul-1955 DOA: 01/04/2024  PCP: [name], NP  Admit date: 01/04/2024 Discharge date: 01/10/2024  Time spent: 45 minutes  Recommendations for Outpatient Follow-up:  Resume lasix  oral from 01/11/24 PCP in 1 week [facility] care in 1 to 2 months   Discharge Diagnoses:  Right lower extremity cellulitis Sepsis, lactic acidosis Acute on chronic diastolic CHF   HOCM (hypertrophic obstructive cardiomyopathy) (HCC)   Pulmonary HTN (HCC)   Atrial fibrillation with RVR (HCC)   Ovarian mass   (HFpEF) heart failure with preserved ejection fraction [facility])   Discharge Condition: Improved  Diet recommendation: Low-sodium, heart healthy  Filed Weights   01/09/24 1100  Weight: 83.3 kg    History of present illness:  Christine Jacobson is a chronically ill obese female with history of diastolic CHF, HOCM, persistent A-fib on Eliquis , ovarian mass, history of recurrent cellulitis presented to the ED with weakness and fall.  Frail at baseline, weaker in the last few days, had pain redness and swelling in her right leg, had some difficulty getting up from the couch and then subsequently fell on the ground, landed on her right buttock, subsequently called EMS and presented to the ED. ED Course: Afebrile, persistent A-fib with RVR, labs noted lactic acidosis, proBNP 2651, potassium 5.5, creatinine 0.9, WBC 13.4.   Admitted for acute RLE cellulitis and acute CHF exacerbation.   Hospital Course:   Sepsis, POA Right lower extremity cellulitis Responded well to IV ceftriaxone , switched to oral Keflex  for 4 more days. Cultures remain negative to date - Continue wound care -Ambulating fairly well, wants to go home instead of rehab, added home health PT and RN  - Follow-up with PCP in 1 week   Acute on chronic diastolic CHF Pulmonary hypertension HOCM Edematous, fairly obese, volume status difficult to assess.  Diuresed with IV Lasix  briefly until volume status improved - given diuretic holiday due to dizziness with positive orthostatic vitals that resolved on repeat the day of discharge - plan to resume oral Lasix  on 01/11/24 - continue Toprol  and Aldactone    Hyperkalemia Resolved   Persistent atrial fibrillation with RVR -Heart rate higher at this time, likely driven by infectious process, now improved -Continue Toprol  50 mg, continue Eliquis    Ovarian mass -Needs GYN follow-up   Elevated alkaline phosphatase - Needs follow-up  Discharge Exam: Vitals:   01/10/24 0353 01/10/24 0731  BP: (!) 144/84 102/72  Pulse: 87 100  Resp: 19 18  Temp: 98.4 F (36.9 C) 98 F (36.7 C)  SpO2: 97% 99%    Physical Exam Vitals and nursing note reviewed.  Constitutional:      General: She is not in acute distress.    Appearance: She is ill-appearing (chronically).  HENT:     Head: Normocephalic and atraumatic.  Cardiovascular:     Rate and Rhythm: Normal rate and regular rhythm.     Pulses: Normal pulses.     Heart sounds: Normal heart sounds.  Pulmonary:     Effort: Pulmonary effort is normal.     Breath sounds: Normal breath sounds.  Abdominal:     General: Bowel sounds are normal.     Palpations: Abdomen is soft.  Musculoskeletal:     Right lower leg: No edema.     Left lower leg: No edema.  Neurological:     Mental Status: She is alert. Mental status is at baseline.       Discharge Instructions  Discharge Instructions     Diet - low sodium heart healthy   Complete by: As directed    Discharge wound care:   Complete by: As directed    RLE: Cleanse with Vashe #[id number], allow to dry, not rinse. Apply a single layer of Xeroform to the wound bed, top with non adherent gauze. Moist the intact skin with barrier cream. Wrap with Kerlix gauze and ACE wrap. Starting from the base of the toes to the distal notch of knee, including the heels. Change daily.   Discharge wound care:    Complete by: As directed    RLE: Cleanse with Vashe #[id number], allow to dry, not rinse. Apply a single layer of Xeroform to the wound bed, top with non adherent gauze. Moist the intact skin with barrier cream. Wrap with Kerlix gauze and ACE wrap. Starting from the base of the toes to the distal notch of knee, including the heels. Change daily.   Increase activity slowly   Complete by: As directed    Increase activity slowly   Complete by: As directed       Allergies as of 01/10/2024       Reactions   Porcine (pork) Protein-containing Drug Products Other (See Comments)   Religious preference        Medication List     TAKE these medications    acetaminophen  325 MG tablet Commonly known as: TYLENOL  Take 2 tablets (650 mg total) by mouth every 6 (six) hours as needed for mild pain (pain score 1-3) or fever (or Fever >/= 101).   allopurinol  100 MG tablet Commonly known as: ZYLOPRIM  Take 100 mg by mouth every morning.   apixaban  5 MG Tabs tablet Commonly known as: Eliquis  Take 1 tablet (5 mg total) by mouth 2 (two) times daily. NEEDS CARDIOLOGY APPT FOR REFILLS, CALL OFFICE [phone].  THANK YOU   atorvastatin  80 MG tablet Commonly known as: LIPITOR  Take 1 tablet (80 mg total) by mouth daily.   cephALEXin  250 MG capsule Commonly known as: KEFLEX  Take 1 capsule (250 mg total) by mouth every 8 (eight) hours for 4 days.   ezetimibe  10 MG tablet Commonly known as: Zetia  Take 1 tablet (10 mg total) by mouth daily for cholesterol.   furosemide  40 MG tablet Commonly known as: Lasix  Take 1 tablet (40 mg total) by mouth in the morning.   hydrocortisone  cream 1 % Apply 1 Application topically 3 (three) times daily as needed for itching (minor skin irritation).   metoprolol  succinate 50 MG 24 hr tablet Commonly known as: TOPROL -XL Take 1 tablet (50 mg total) by mouth daily. Take with or immediately following a meal. What changed: Another medication with the same name was  removed. Continue taking this medication, and follow the directions you see here.   pantoprazole  40 MG tablet Commonly known as: PROTONIX  Take 1 tablet (40 mg total) by mouth daily.   Potassium Chloride  ER 20 MEQ Tbcr Take 1 tablet (20 mEq total) by mouth every other day.   spironolactone  25 MG tablet Commonly known as: ALDACTONE  Take 1 tablet (25 mg total) by mouth daily.   Zinc  Sulfate 220 (50 Zn) MG Tabs Take 1 tablet (220 mg total) by mouth daily for 30 days               Discharge Care Instructions  (From admission, onward)           Start     Ordered   01/10/24 0000  Discharge wound  care:       Comments: RLE: Cleanse with Vashe [id number], allow to dry, not rinse. Apply a single layer of Xeroform to the wound bed, top with non adherent gauze. Moist the intact skin with barrier cream. Wrap with Kerlix gauze and ACE wrap. Starting from the base of the toes to the distal notch of knee, including the heels. Change daily.   01/10/24 1315   01/09/24 0000  Discharge wound care:       Comments: RLE: Cleanse with Vashe #[id number], allow to dry, not rinse. Apply a single layer of Xeroform to the wound bed, top with non adherent gauze. Moist the intact skin with barrier cream. Wrap with Kerlix gauze and ACE wrap. Starting from the base of the toes to the distal notch of knee, including the heels. Change daily.   01/09/24 1142           Allergies[1]  Contact information for after-discharge care     Home Medical Care     [facility]) .   Service: Home Health Services Why: Agency will call you to set up apt times Contact information: [address] [place]  [address] [phone]                      The results of significant diagnostics from this hospitalization (including imaging, microbiology, ancillary and laboratory) are listed below for reference.    Significant Diagnostic Studies: DG Pelvis  Portable Result Date: 01/04/2024 CLINICAL DATA:  Trauma EXAM: DG PORTABLE PELVIS COMPARISON:  None Available. 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 Mild left hip osteoarthritis. Multilevel degenerative disc disease of the spine. Soft tissues are unremarkable. IMPRESSION: No acute fracture, pelvic bone diastasis, or dislocation. Electronically Signed   By: Rogelia Myers M.D.   On: 01/04/2024 08:07   DG Chest Port 1 View Result Date: 01/04/2024 EXAM: 1 VIEW(S) XRAY OF THE CHEST 01/04/2024 07:46:00 AM COMPARISON: 11/21/2023 CLINICAL HISTORY: Trauma FINDINGS: LUNGS AND PLEURA: No focal pulmonary opacity. No pleural effusion. No pneumothorax. HEART AND MEDIASTINUM: Cardiomegaly, unchanged. BONES AND SOFT TISSUES: No acute osseous abnormality. IMPRESSION: 1. No acute process. Electronically signed by: Waddell Calk MD 01/04/2024 08:06 AM EST RP Workstation: [id number]    Microbiology: Recent Results (from the past 240 hours)  Culture, blood (routine x 2)     Status: None   Collection Time: 01/04/24  8:37 AM   Specimen: BLOOD LEFT ARM  Result Value Ref Range Status   Specimen Description BLOOD LEFT ARM  Final   Special Requests   Final    AEROBIC BOTTLE ONLY Blood Culture results may not be optimal due to an inadequate volume of blood received in culture bottles   Culture   Final    NO GROWTH 5 DAYS Performed at [facility], [address], [place], [place] [address]    Report Status 01/09/2024 FINAL  Final  Culture, blood (routine x 2)     Status: None   Collection Time: 01/04/24  8:42 AM   Specimen: BLOOD RIGHT ARM  Result Value Ref Range Status   Specimen Description BLOOD RIGHT ARM  Final   Special Requests   Final    BOTTLES DRAWN AEROBIC AND ANAEROBIC Blood Culture results may not be optimal due to an inadequate volume of blood received in culture bottles    Culture   Final  NO GROWTH 5 DAYS Performed at [facility], [address], [place], [place] [address]    Report Status 01/09/2024 FINAL  Final     Labs: Basic Metabolic Panel: Recent Labs  Lab 01/04/24 0850 01/05/24 0416 01/06/24 0652 01/08/24 0344 01/09/24 0441  NA 137 137 140 140 139  K 5.5* 4.8 3.6 4.1 3.7  CL 106 108 105 105 104  CO2 16* 20* 27 26 25   GLUCOSE 82 90 89 91 102*  BUN 21 23 24* 21 21  CREATININE 0.90 1.10* 1.21* 0.97 1.02*  CALCIUM  10.1 9.8 9.2 9.3 9.6   Liver Function Tests: Recent Labs  Lab 01/04/24 0850 01/05/24 0416  AST 44* 30  ALT 20 14  ALKPHOS 251* 204*  BILITOT 1.2 0.5  PROT 6.7 5.8*  ALBUMIN  3.7 3.3*   No results for input(s): LIPASE, AMYLASE in the last 168 hours. No results for input(s): AMMONIA in the last 168 hours. CBC: Recent Labs  Lab 01/04/24 0832 01/04/24 0850 01/05/24 0416 01/06/24 0652  WBC  --  13.4* 7.7 6.6  HGB 13.3 11.7* 10.9* 11.1*  HCT 39.0 38.3 35.4* 35.8*  MCV  --  90.3 90.3 89.1  PLT  --  278 284 284   Cardiac Enzymes: No results for input(s): CKTOTAL, CKMB, CKMBINDEX, TROPONINI in the last 168 hours. BNP: BNP (last 3 results) Recent Labs    05/11/23 0306 11/13/23 0920 11/21/23 1215  BNP 551.0* 915.1* 741.8*    ProBNP (last 3 results) Recent Labs    10/11/23 0935 10/12/23 0458 01/04/24 0850  PROBNP 4,647.0* 4,823.0* 2,651.0*    CBG: No results for input(s): GLUCAP in the last 168 hours.     Signed:  Norval Bar MD.  [facility] 01/10/2024, 1:16 PM         [1]  Allergies Allergen Reactions   Porcine (Pork) Protein-Containing Drug Products Other (See Comments)    Religious preference

## 2024-01-10 NOTE — Progress Notes (Signed)
 Physical Therapy Treatment Patient Details Name: Christine Jacobson MRN: [id number] DOB: 25-Jun-1955 Today's Date: 01/10/2024   History of Present Illness Pt is a 68 y.o female admitted 12/18 for fall at home. Pain in RLE, imaging negative for fx. Pt found to have right lower extremity cellulitis. Note symptomatic orthostatic hypotension during PT session 12/23, RN/MD notified.  PMH: obese, CHF, afib, ovarian mass, chronic cellulitis.    PT Comments  Pt received in supine, agreeable to therapy session with encouragement and discussion with PTA on benefits of mobility and sitting up OOB to improve pulmonary clearance and BP stability. Pt needing up to CGA for stair negotiation with bil UE support to simulate home entry and chair follow for safety with gait due to pt fatiguing abruptly and unable to make it back to room before she fatigues. BP checked sitting/standing with pt symptomatic and SBP dropped but <20 points pt not able to tolerate 3 min standing BP due to fatigue and needing to sit. Pt would benefit from some sort of fall alert system for home, such as Life Alert, MD notified. Pt could trial abdominal binder next session with therapies to see if this improves standing/gait tolerance. Pt c/o BLE pain and defers to trial compression socks, due to chronic cellulitis. Patient will benefit from continued inpatient follow up therapy, <3 hours/day, although pt likely to refuse, care team aware. Pt reports plan to DC home via taxi once medically cleared.   If plan is discharge home, recommend the following: Assist for transportation;Assistance with cooking/housework;A little help with walking and/or transfers;A little help with bathing/dressing/bathroom   Can travel by private vehicle      (if symptoms of low BP improve, may be able to tolerate)  Equipment Recommendations  None recommended by PT (pt may benefit from Life Alert type system to call for assistance in case of falls at home, MD notified)     Recommendations for Other Services       Precautions / Restrictions Precautions Precautions: Fall Recall of Precautions/Restrictions: Impaired Precaution/Restrictions Comments: watch BP, orthostatic on 12/23 Restrictions Weight Bearing Restrictions Per Provider Order: No     Mobility  Bed Mobility Overal bed mobility: Needs Assistance Bed Mobility: Supine to Sit     Supine to sit: Used rails, Modified independent (Device/Increase time)     General bed mobility comments: Pt reports she sleeps on her couch at home    Transfers Overall transfer level: Needs assistance Equipment used: Rolling walker (2 wheels) Transfers: Sit to/from Stand Sit to Stand: Supervision           General transfer comment: cues for safety from EOB and to chair    Ambulation/Gait Ambulation/Gait assistance: Contact guard assist, +2 safety/equipment Gait Distance (Feet): 50 Feet (80ft, seated break, 9ft, seated break, 21ft) Assistive device: Rolling walker (2 wheels) Gait Pattern/deviations: Narrow base of support, Decreased dorsiflexion - right, Decreased dorsiflexion - left, Step-through pattern, Decreased stride length Gait velocity: decreased     General Gait Details: cues for RW proximity and frequent check-ins for symptoms/fatigue, pt inconsistent as she denies fatigue but then abruptly reports need to sit down. BP checked sitting/standing and some drop but <20 points SBP drop.   Stairs Stairs: Yes Stairs assistance: Contact guard assist Stair Management: Step to pattern, Forwards (RW to simulate bil rails) Number of Stairs: 4 General stair comments: 6 step in room x4 reps, cues for safe technique with fair carryover, no imbalance but pt appears fatigued after 4th rep.   Wheelchair  Mobility     Tilt Bed    Modified Rankin (Stroke Patients Only)       Balance Overall balance assessment: Needs assistance Sitting-balance support: Feet supported, No upper extremity  supported Sitting balance-Leahy Scale: Good     Standing balance support: Bilateral upper extremity supported, During functional activity, Reliant on assistive device for balance Standing balance-Leahy Scale: Poor Standing balance comment: Dependent on RW support                            Communication Communication Communication: Impaired Factors Affecting Communication: Difficulty expressing self (esp when standing)  Cognition Arousal: Alert Behavior During Therapy: Flat affect   PT - Cognitive impairments: No family/caregiver present to determine baseline, Difficult to assess, Safety/Judgement, Problem solving, Sequencing, Initiation, Attention                       PT - Cognition Comments: Pt often stating I'm not tired then less than one minute later, pt requesting to sit down, but does not verbalize specific symptoms such as nausea or lightheadedness this session. Following commands: Impaired      Cueing Cueing Techniques: Verbal cues, Gestural cues  Exercises      General Comments General comments (skin integrity, edema, etc.): see BP in comments above      Pertinent Vitals/Pain Pain Assessment Pain Assessment: Faces Faces Pain Scale: Hurts a little bit Pain Location: no specific c/o, but appears to grimace when ascending step with RLE Pain Descriptors / Indicators: Grimacing Pain Intervention(s): Limited activity within patient's tolerance, Monitored during session, Repositioned    Home Living                          Prior Function            PT Goals (current goals can now be found in the care plan section) Acute Rehab PT Goals Patient Stated Goal: none stated, pt wanting to go home PT Goal Formulation: Patient unable to participate in goal setting Time For Goal Achievement: 01/19/24 Progress towards PT goals: Progressing toward goals    Frequency    Min 2X/week      PT Plan      Co-evaluation               AM-PAC PT 6 Clicks Mobility   Outcome Measure  Help needed turning from your back to your side while in a flat bed without using bedrails?: None Help needed moving from lying on your back to sitting on the side of a flat bed without using bedrails?: A Little (flat bed without rail) Help needed moving to and from a bed to a chair (including a wheelchair)?: A Little Help needed standing up from a chair using your arms (e.g., wheelchair or bedside chair)?: A Little Help needed to walk in hospital room?: A Little Help needed climbing 3-5 steps with a railing? : A Little 6 Click Score: 19    End of Session Equipment Utilized During Treatment: Gait belt Activity Tolerance: Other (comment);Treatment limited secondary to medical complications (Comment);Patient tolerated treatment well;Patient limited by fatigue (possible orthostatic symptoms, but improved participation today) Patient left: with call bell/phone within reach;Other (comment);in chair;with chair alarm set (pillow under LE in recliner to elevate them slightly; pt encouraged to sit up 1-2 hours) Nurse Communication: Mobility status;Other (comment);Precautions (lightheaded/fatigues quickly) PT Visit Diagnosis: Unsteadiness on feet (R26.81);Other abnormalities of gait  and mobility (R26.89);Muscle weakness (generalized) (M62.81);Pain Pain - part of body: Leg     Time: 8693-8670 PT Time Calculation (min) (ACUTE ONLY): 23 min  Charges:    $Gait Training: 8-22 mins $Therapeutic Activity: 8-22 mins PT General Charges $$ ACUTE PT VISIT: 1 Visit                     Adlee Paar P., PTA Acute Rehabilitation Services Secure Chat Preferred 9a-5:30pm Office: [phone]    Connell HERO [facility] 01/10/2024, 1:48 PM

## 2024-01-15 ENCOUNTER — Other Ambulatory Visit (HOSPITAL_COMMUNITY): Payer: Self-pay

## 2024-01-17 ENCOUNTER — Other Ambulatory Visit: Payer: Self-pay | Admitting: Cardiology

## 2024-01-17 DIAGNOSIS — I480 Paroxysmal atrial fibrillation: Secondary | ICD-10-CM

## 2024-01-17 NOTE — Telephone Encounter (Signed)
 Prescription refill request for Eliquis  received. Indication: afib  Last office visit: 11/24/2022, Tobb  Scr:  1.02, 01/09/2024 Age:68 yo  Weight: 83.3 kg   Pt is overdue for an office visit. Msg sent to schedulers.

## 2024-01-23 NOTE — Telephone Encounter (Signed)
 Per office refill policy with 2nd refill request send 15 day supply.  Pt is overdue to see Provider-last seen 11/24/22  69 yrs old, wt-83.3kg, Crea-1.02 on 1.02  Will send another to Schedulers to reach out.

## 2024-01-24 ENCOUNTER — Telehealth: Payer: Self-pay | Admitting: *Deleted

## 2024-01-24 NOTE — Telephone Encounter (Signed)
 Spoke with Christine Jacobson to remind patient of her new patient appointment with Dr. Viktoria this Friday, 1/9. Pt states she doesn't remember making the appointment, has no idea why she is seeing Dr. Viktoria. Reminded patient that the office called on 11/25 to schedule her an appointment from referral by Dr. Izell ob/gyn because her CT scan showed pelvic mass. Pt doesn't recall any of this information and states she has been in the hospital and would like to reschedule this appointment. Pt was rescheduled for Friday, January 23 rd at 11:15. Pt is aware to arrive by 10:45 am for check in and the office would call a few days prior to this appointment as a reminder.

## 2024-01-26 ENCOUNTER — Inpatient Hospital Stay: Admitting: Gynecologic Oncology

## 2024-02-02 ENCOUNTER — Inpatient Hospital Stay (HOSPITAL_COMMUNITY)
Admission: EM | Admit: 2024-02-02 | Discharge: 2024-02-15 | DRG: 602 | Disposition: A | Discharge status: Skilled Nursing Facility | Attending: Internal Medicine | Admitting: Internal Medicine

## 2024-02-02 ENCOUNTER — Emergency Department (HOSPITAL_COMMUNITY)

## 2024-02-02 ENCOUNTER — Encounter (HOSPITAL_COMMUNITY): Payer: Self-pay

## 2024-02-02 DIAGNOSIS — Z8249 Family history of ischemic heart disease and other diseases of the circulatory system: Secondary | ICD-10-CM

## 2024-02-02 DIAGNOSIS — Z7901 Long term (current) use of anticoagulants: Secondary | ICD-10-CM | POA: Diagnosis not present

## 2024-02-02 DIAGNOSIS — I5033 Acute on chronic diastolic (congestive) heart failure: Secondary | ICD-10-CM | POA: Diagnosis present

## 2024-02-02 DIAGNOSIS — Z91014 Allergy to mammalian meats: Secondary | ICD-10-CM | POA: Diagnosis not present

## 2024-02-02 DIAGNOSIS — Z825 Family history of asthma and other chronic lower respiratory diseases: Secondary | ICD-10-CM | POA: Diagnosis not present

## 2024-02-02 DIAGNOSIS — E669 Obesity, unspecified: Secondary | ICD-10-CM | POA: Diagnosis present

## 2024-02-02 DIAGNOSIS — Z6831 Body mass index (BMI) 31.0-31.9, adult: Secondary | ICD-10-CM

## 2024-02-02 DIAGNOSIS — I890 Lymphedema, not elsewhere classified: Secondary | ICD-10-CM | POA: Diagnosis present

## 2024-02-02 DIAGNOSIS — I421 Obstructive hypertrophic cardiomyopathy: Secondary | ICD-10-CM | POA: Diagnosis present

## 2024-02-02 DIAGNOSIS — K219 Gastro-esophageal reflux disease without esophagitis: Secondary | ICD-10-CM | POA: Diagnosis present

## 2024-02-02 DIAGNOSIS — I110 Hypertensive heart disease with heart failure: Secondary | ICD-10-CM | POA: Diagnosis present

## 2024-02-02 DIAGNOSIS — M7989 Other specified soft tissue disorders: Secondary | ICD-10-CM | POA: Diagnosis present

## 2024-02-02 DIAGNOSIS — F419 Anxiety disorder, unspecified: Secondary | ICD-10-CM | POA: Diagnosis present

## 2024-02-02 DIAGNOSIS — Z751 Person awaiting admission to adequate facility elsewhere: Secondary | ICD-10-CM | POA: Diagnosis not present

## 2024-02-02 DIAGNOSIS — M1A9XX0 Chronic gout, unspecified, without tophus (tophi): Secondary | ICD-10-CM | POA: Diagnosis present

## 2024-02-02 DIAGNOSIS — I10 Essential (primary) hypertension: Secondary | ICD-10-CM | POA: Diagnosis not present

## 2024-02-02 DIAGNOSIS — Z87891 Personal history of nicotine dependence: Secondary | ICD-10-CM | POA: Diagnosis not present

## 2024-02-02 DIAGNOSIS — L03115 Cellulitis of right lower limb: Principal | ICD-10-CM | POA: Diagnosis present

## 2024-02-02 DIAGNOSIS — E782 Mixed hyperlipidemia: Secondary | ICD-10-CM | POA: Diagnosis present

## 2024-02-02 DIAGNOSIS — Z79899 Other long term (current) drug therapy: Secondary | ICD-10-CM | POA: Diagnosis not present

## 2024-02-02 DIAGNOSIS — Z66 Do not resuscitate: Secondary | ICD-10-CM | POA: Diagnosis present

## 2024-02-02 DIAGNOSIS — I480 Paroxysmal atrial fibrillation: Secondary | ICD-10-CM

## 2024-02-02 DIAGNOSIS — D6859 Other primary thrombophilia: Secondary | ICD-10-CM | POA: Diagnosis present

## 2024-02-02 DIAGNOSIS — Z833 Family history of diabetes mellitus: Secondary | ICD-10-CM

## 2024-02-02 DIAGNOSIS — I4819 Other persistent atrial fibrillation: Secondary | ICD-10-CM | POA: Diagnosis present

## 2024-02-02 DIAGNOSIS — R252 Cramp and spasm: Secondary | ICD-10-CM | POA: Diagnosis present

## 2024-02-02 DIAGNOSIS — Z8041 Family history of malignant neoplasm of ovary: Secondary | ICD-10-CM

## 2024-02-02 LAB — COMPREHENSIVE METABOLIC PANEL WITH GFR
ALT: 33 U/L (ref 0–44)
AST: 68 U/L — ABNORMAL HIGH (ref 15–41)
Albumin: 3.5 g/dL (ref 3.5–5.0)
Alkaline Phosphatase: 316 U/L — ABNORMAL HIGH (ref 38–126)
Anion gap: 11 (ref 5–15)
BUN: 17 mg/dL (ref 8–23)
CO2: 20 mmol/L — ABNORMAL LOW (ref 22–32)
Calcium: 10.1 mg/dL (ref 8.9–10.3)
Chloride: 107 mmol/L (ref 98–111)
Creatinine, Ser: 0.96 mg/dL (ref 0.44–1.00)
GFR, Estimated: >60 mL/min
Glucose, Bld: 91 mg/dL (ref 70–99)
Potassium: 4.6 mmol/L (ref 3.5–5.1)
Sodium: 138 mmol/L (ref 135–145)
Total Bilirubin: 0.4 mg/dL (ref 0.0–1.2)
Total Protein: 7.5 g/dL (ref 6.5–8.1)

## 2024-02-02 LAB — CBC WITH DIFFERENTIAL/PLATELET
Abs Immature Granulocytes: 0.11 K/uL — ABNORMAL HIGH (ref 0.00–0.07)
Basophils Absolute: 0.1 K/uL (ref 0.0–0.1)
Basophils Relative: 1 %
Eosinophils Absolute: 0.1 K/uL (ref 0.0–0.5)
Eosinophils Relative: 1 %
HCT: 42.6 % (ref 36.0–46.0)
Hemoglobin: 13 g/dL (ref 12.0–15.0)
Immature Granulocytes: 1 %
Lymphocytes Relative: 7 %
Lymphs Abs: 0.8 K/uL (ref 0.7–4.0)
MCH: 26.3 pg (ref 26.0–34.0)
MCHC: 30.5 g/dL (ref 30.0–36.0)
MCV: 86.1 fL (ref 80.0–100.0)
Monocytes Absolute: 0.5 K/uL (ref 0.1–1.0)
Monocytes Relative: 4 %
Neutro Abs: 10.1 K/uL — ABNORMAL HIGH (ref 1.7–7.7)
Neutrophils Relative %: 86 %
Platelets: 356 K/uL (ref 150–400)
RBC: 4.95 MIL/uL (ref 3.87–5.11)
RDW: 17.5 % — ABNORMAL HIGH (ref 11.5–15.5)
WBC: 11.7 K/uL — ABNORMAL HIGH (ref 4.0–10.5)
nRBC: 0 % (ref 0.0–0.2)

## 2024-02-02 LAB — I-STAT CG4 LACTIC ACID, ED
Lactic Acid, Venous: 2 mmol/L (ref 0.5–1.9)
Lactic Acid, Venous: 1.5 mmol/L (ref 0.5–1.9)

## 2024-02-02 LAB — PRO BRAIN NATRIURETIC PEPTIDE: Pro Brain Natriuretic Peptide: 3554 pg/mL — ABNORMAL HIGH

## 2024-02-02 MED ORDER — PANTOPRAZOLE SODIUM 40 MG PO TBEC
40.0000 mg | DELAYED_RELEASE_TABLET | Freq: Every day | ORAL | Status: DC
  Administered 2024-02-03 – 2024-02-15 (×13): 40 mg via ORAL
  Filled 2024-02-02 (×13): qty 1

## 2024-02-02 MED ORDER — FUROSEMIDE 10 MG/ML IJ SOLN
40.0000 mg | Freq: Every day | INTRAMUSCULAR | Status: DC
  Administered 2024-02-02 – 2024-02-03 (×2): 40 mg via INTRAVENOUS
  Filled 2024-02-02 (×3): qty 4

## 2024-02-02 MED ORDER — VANCOMYCIN HCL 1250 MG/250ML IV SOLN
1250.0000 mg | INTRAVENOUS | Status: DC
  Administered 2024-02-03 – 2024-02-04 (×2): 1250 mg via INTRAVENOUS
  Filled 2024-02-02 (×3): qty 250

## 2024-02-02 MED ORDER — ONDANSETRON HCL 4 MG/2ML IJ SOLN: 4.0000 mg | Freq: Four times a day (QID) | INTRAMUSCULAR | Status: DC | PRN

## 2024-02-02 MED ORDER — VANCOMYCIN HCL IN DEXTROSE 1-5 GM/200ML-% IV SOLN
1000.0000 mg | Freq: Once | INTRAVENOUS | Status: AC
  Administered 2024-02-02: 1000 mg via INTRAVENOUS
  Filled 2024-02-02: qty 200

## 2024-02-02 MED ORDER — SENNOSIDES-DOCUSATE SODIUM 8.6-50 MG PO TABS: 1.0000 | ORAL_TABLET | Freq: Every evening | ORAL | Status: DC | PRN

## 2024-02-02 MED ORDER — BISACODYL 5 MG PO TBEC: 5.0000 mg | DELAYED_RELEASE_TABLET | Freq: Every day | ORAL | Status: DC | PRN

## 2024-02-02 MED ORDER — APIXABAN 5 MG PO TABS
5.0000 mg | ORAL_TABLET | Freq: Two times a day (BID) | ORAL | Status: DC
  Administered 2024-02-02 – 2024-02-15 (×26): 5 mg via ORAL
  Filled 2024-02-02 (×26): qty 1

## 2024-02-02 MED ORDER — ACETAMINOPHEN 650 MG RE SUPP: 650.0000 mg | Freq: Four times a day (QID) | RECTAL | Status: DC | PRN

## 2024-02-02 MED ORDER — ACETAMINOPHEN 325 MG PO TABS
650.0000 mg | ORAL_TABLET | Freq: Four times a day (QID) | ORAL | Status: DC | PRN
  Administered 2024-02-02 – 2024-02-15 (×21): 650 mg via ORAL
  Filled 2024-02-02 (×21): qty 2

## 2024-02-02 MED ORDER — ALLOPURINOL 100 MG PO TABS
100.0000 mg | ORAL_TABLET | Freq: Every morning | ORAL | Status: DC
  Administered 2024-02-03 – 2024-02-15 (×13): 100 mg via ORAL
  Filled 2024-02-02 (×13): qty 1

## 2024-02-02 MED ORDER — ATORVASTATIN CALCIUM 40 MG PO TABS
80.0000 mg | ORAL_TABLET | Freq: Every day | ORAL | Status: DC
  Administered 2024-02-03 – 2024-02-15 (×13): 80 mg via ORAL
  Filled 2024-02-02 (×13): qty 2

## 2024-02-02 MED ORDER — VANCOMYCIN HCL 1250 MG/250ML IV SOLN
1250.0000 mg | INTRAVENOUS | Status: DC
  Filled 2024-02-02: qty 250

## 2024-02-02 MED ORDER — ONDANSETRON HCL 4 MG PO TABS: 4.0000 mg | ORAL_TABLET | Freq: Four times a day (QID) | ORAL | Status: DC | PRN

## 2024-02-02 MED ORDER — METOPROLOL TARTRATE 5 MG/5ML IV SOLN
5.0000 mg | Freq: Once | INTRAVENOUS | Status: AC
  Administered 2024-02-02: 5 mg via INTRAVENOUS
  Filled 2024-02-02: qty 5

## 2024-02-02 MED ORDER — POTASSIUM CHLORIDE 20 MEQ PO PACK
20.0000 meq | PACK | ORAL | Status: DC
  Administered 2024-02-03 – 2024-02-15 (×7): 20 meq via ORAL
  Filled 2024-02-02 (×7): qty 1

## 2024-02-02 MED ORDER — METOPROLOL SUCCINATE ER 50 MG PO TB24
50.0000 mg | ORAL_TABLET | Freq: Every day | ORAL | Status: DC
  Administered 2024-02-02 – 2024-02-15 (×14): 50 mg via ORAL
  Filled 2024-02-02 (×14): qty 1

## 2024-02-02 NOTE — Progress Notes (Addendum)
 Pharmacy Antibiotic Note  Christine Jacobson is a 69 y.o. female admitted on 02/02/2024 with cellulitis.  Pharmacy has been consulted for Vanco dosing.  Active Problem(s): right leg wound and swelling with drainage.  - admitted to the hospital in December with sepsis from cellulitis of her right leg and was discharged on Keflex  which she has completed.   AC/Heme: Eliquis  as PTA - CBC WNL  Vanco 1/16>>  Plan: Vancomycin  1g IV x 1 in ED then Vancomycin  1250 mg IV Q 24 hrs. Goal AUC 400-550. Expected AUC: 479 SCr used: 0.96  Weight: 83.3 kg (183 lb 9.6 oz)  Temp (24hrs), Avg:97.7 F (36.5 C), Min:97.6 F (36.4 C), Max:97.8 F (36.6 C)  Recent Labs  Lab 02/02/24 1412 02/02/24 1420  WBC 11.7*  --   CREATININE 0.96  --   LATICACIDVEN  --  2.0*    Estimated Creatinine Clearance: 56.9 mL/min (by C-G formula based on SCr of 0.96 mg/dL).    Allergies[1]  Aishwarya Shiplett Karoline Marina, PharmD, BCPS Clinical Staff Pharmacist  [facility] 02/02/2024 5:57 PM     [1]  Allergies Allergen Reactions   Porcine (Pork) Protein-Containing Drug Products Other (See Comments)    Religious preference

## 2024-02-02 NOTE — Consult Note (Signed)
 WOC Nurse Consult Note: patient is well known to WOC team from previous admissions with venous insufficiency and recurrent ulcerations  Reason for Consult: wounds R lower extremity  Wound type: partial and full thickness r/t venous insufficiency  Pressure Injury POA: NA not pressure  Measurement: see nursing flowsheet  Wound bed: appears largely red moist  Drainage (amount, consistency, odor) weeping clear fluid per MD note  Periwound:erythema and edema; being treated for cellulitis  Dressing procedure/placement/frequency: Cleanse R lower leg and foot (wounds and intact skin) with Vashe, do not rinse.  Apply Xeroform gauze ([place] [phone]) to wound beds daily, cover with ABD pad and secure with Kerlix roll gauze beginning right above toes and ending right below knee. Apply Ace bandage wrapped in same fashion as Kerlix for light compression.   WOC team will not follow. According to notes home health comes out and provides wound care as outpatient.    Thank you,    Powell Bar MSN, RN-BC, [employer]

## 2024-02-02 NOTE — ED Notes (Signed)
PT provided with sandwich.

## 2024-02-02 NOTE — ED Provider Notes (Signed)
 " [place] EMERGENCY DEPARTMENT AT [facility] Provider Note   CSN: [id number] Arrival date & time: 02/02/24  1114     Patient presents with: Leg Swelling   Christine Jacobson is a 69 y.o. female.   Patient is a 68 year old female with a past medical history of HOCM, CHF, hypertension, A-fib on Eliquis  presenting to the emergency department with right leg wound and swelling.  The patient states for about the last month she has had a home health nurse come to dressing changes on her leg.  She states that when the home health nurse came today she was concerned that her leg looks significantly worse and recommended she come to the ED for evaluation. She reports wound care RN reported green drainage.  The patient states that she has had no fevers, nausea or vomiting.  She states that she occasionally feels short of breath which she relates to her anxiety.  Of note she was admitted to the hospital in December with sepsis from cellulitis of her right leg and was discharged on Keflex  which she has completed.  The history is provided by the patient.       Prior to Admission medications  Medication Sig Start Date End Date Taking? Authorizing Provider  acetaminophen  (TYLENOL ) 325 MG tablet Take 2 tablets (650 mg total) by mouth every 6 (six) hours as needed for mild pain (pain score 1-3) or fever (or Fever >/= 101). 10/14/23   Sebastian Toribio GAILS, MD  allopurinol  (ZYLOPRIM ) 100 MG tablet Take 100 mg by mouth every morning. Patient not taking: Reported on 01/04/2024 12/08/23   [provider]  apixaban  (ELIQUIS ) 5 MG TABS tablet TAKE ONE TABLET (5 MG TOTAL) BY MOUTH TWICE DAILY AT 9AM & 5PM (NEEDS CARDIOLOGY APPOINTMENT FOR REFILLS, CALL OFFICE [phone]. THANK YOU) 01/23/24   Tobb, Kardie, DO  atorvastatin  (LIPITOR ) 80 MG tablet Take 1 tablet (80 mg total) by mouth daily. Patient not taking: Reported on 11/14/2023 10/14/23   Sebastian Toribio GAILS, MD  ezetimibe  (ZETIA ) 10 MG tablet  Take 1 tablet (10 mg total) by mouth daily for cholesterol. Patient not taking: Reported on 01/04/2024 10/30/23     furosemide  (LASIX ) 40 MG tablet Take 1 tablet (40 mg total) by mouth in the morning. 10/14/23   Sebastian Toribio GAILS, MD  hydrocortisone  cream 1 % Apply 1 Application topically 3 (three) times daily as needed for itching (minor skin irritation). 11/27/23   Odell Celinda Balo, MD  metoprolol  succinate (TOPROL -XL) 50 MG 24 hr tablet Take 1 tablet (50 mg total) by mouth daily. Take with or immediately following a meal. Patient not taking: Reported on 01/04/2024 11/16/23   Gonfa, Taye T, MD  pantoprazole  (PROTONIX ) 40 MG tablet Take 1 tablet (40 mg total) by mouth daily. Patient not taking: Reported on 01/04/2024 10/14/23   Sebastian Toribio GAILS, MD  Potassium Chloride  ER 20 MEQ TBCR Take 1 tablet (20 mEq total) by mouth every other day. 05/31/23   Tobb, Kardie, DO  spironolactone  (ALDACTONE ) 25 MG tablet Take 1 tablet (25 mg total) by mouth daily. 01/10/24   Fairy Frames, MD  QUEtiapine  (SEROQUEL ) 25 MG tablet Take 0.5 tablets (12.5 mg total) by mouth at bedtime for 7 days, THEN 1 tablet (25 mg total) at bedtime for mood and insomnia. Patient not taking: Reported on 05/11/2023 04/20/23 07/13/23      Allergies: Porcine (pork) protein-containing drug products    Review of Systems  Updated Vital Signs BP (!) 140/103  Pulse 85   Temp 97.8 F (36.6 C) (Oral)   Resp 20   SpO2 100%   Physical Exam Vitals and nursing note reviewed.  Constitutional:      General: She is not in acute distress.    Appearance: Normal appearance. She is obese.  HENT:     Head: Normocephalic and atraumatic.     Nose: Nose normal.     Mouth/Throat:     Mouth: Mucous membranes are moist.  Eyes:     Extraocular Movements: Extraocular movements intact.     Conjunctiva/sclera: Conjunctivae normal.  Cardiovascular:     Rate and Rhythm: Normal rate and regular rhythm.     Heart sounds: Normal heart sounds.   Pulmonary:     Effort: Pulmonary effort is normal.     Breath sounds: Normal breath sounds.  Abdominal:     General: Abdomen is flat.  Musculoskeletal:        General: Normal range of motion.     Cervical back: Normal range of motion.     Right lower leg: Edema present.     Left lower leg: Edema (1+ pitting in foot, chronic skin changes) present.  Skin:    Comments: Erythema, warmth and peeling of the skin of the R foot and shin, no active drainage  Neurological:     General: No focal deficit present.     Mental Status: She is alert and oriented to person, place, and time.  Psychiatric:        Mood and Affect: Mood normal.        Behavior: Behavior normal.     (all labs ordered are listed, but only abnormal results are displayed) Labs Reviewed  CBC WITH DIFFERENTIAL/PLATELET - Abnormal; Notable for the following components:      Result Value   WBC 11.7 (*)    RDW 17.5 (*)    Neutro Abs 10.1 (*)    Abs Immature Granulocytes 0.11 (*)    All other components within normal limits  COMPREHENSIVE METABOLIC PANEL WITH GFR - Abnormal; Notable for the following components:   CO2 20 (*)    AST 68 (*)    Alkaline Phosphatase 316 (*)    All other components within normal limits  PRO BRAIN NATRIURETIC PEPTIDE - Abnormal; Notable for the following components:   Pro Brain Natriuretic Peptide 3,554.0 (*)    All other components within normal limits  I-STAT CG4 LACTIC ACID, ED - Abnormal; Notable for the following components:   Lactic Acid, Venous 2.0 (*)    All other components within normal limits  I-STAT CG4 LACTIC ACID, ED    EKG: None  Radiology: [facility] Chest Port 1 View Result Date: 02/02/2024 CLINICAL DATA:  Swelling.  Shortness of breath. EXAM: PORTABLE CHEST 1 VIEW COMPARISON:  None Available. FINDINGS: Both lungs are clear. Heart size is upper limits of normal but stable. Trachea is midline. Negative for a pneumothorax. No acute bone abnormality. IMPRESSION: No active disease.  Electronically Signed   By: Juliene Balder M.D.   On: 02/02/2024 13:58     Procedures   Medications Ordered in the ED  vancomycin  (VANCOCIN ) IVPB 1000 mg/200 mL premix (1,000 mg Intravenous New Bag/Given 02/02/24 1411)    Clinical Course as of 02/02/24 1610  Fri Feb 02, 2024  1413 RN did notify me of macular papular rash on patient's bilateral arms and back. Patient reports this is non-itchy and is unsure how long it has been present, states she thought it  was related to dry skin. Rash was noted before given vancomycin  or any other drugs in the ED.  [VK]  1544 Labs with mildly elevated lactic and leukocytosis, also increased BNP with likely mild volume overload. With worsening cellulitis, recommended admission for IV antibiotics. [VK]    Clinical Course User Index [VK] Kingsley, Kennedy Brines K, DO                                 Medical Decision Making This patient presents to the ED with chief complaint(s) of LE wound/swelling with pertinent past medical history of HOCM, CHF, A fib, HTN which further complicates the presenting complaint. The complaint involves an extensive differential diagnosis and also carries with it a high risk of complications and morbidity.    The differential diagnosis includes cellulitis, sepsis, chronicity of the wound and no palpable crepitus and necrotizing fasciitis is unlikely, CHF exacerbation  Additional history obtained: Additional history obtained from N/A Records reviewed previous admission documents  ED Course and Reassessment: On patient's arrival she is mildly tachycardic and otherwise hemodynamically stable in no acute distress.  She does have erythema and warmth to her leg that appears worse compared to pictures from her last admission.  She will have labs and will be started on antibiotics and will be closely reassessed.  Independent labs interpretation:  The following labs were independently interpreted: elevated BNP, mildly elevated lactic and  leukocytosis  Independent visualization of imaging: - I independently visualized the following imaging with scope of interpretation limited to determining acute life threatening conditions related to emergency care: CXR, which revealed no acute disease  Consultation: - Consulted or discussed management/test interpretation w/ external professional: hospitalist  Consideration for admission or further workup: patient requires admission for worsening antibiotics  Social Determinants of health: N/A    Amount and/or Complexity of Data Reviewed Labs: ordered. Radiology: ordered.  Risk Prescription drug management. Decision regarding hospitalization.       Final diagnoses:  Cellulitis of right lower extremity    ED Discharge Orders     None          Ellouise Richerd POUR, DO 02/02/24 1610  "

## 2024-02-02 NOTE — ED Notes (Signed)
 Walked into the room to take the patient upstairs. Patient heart rate is 140-150. Patient denies chest pain and reports the same shortness of breath. Paged the

## 2024-02-02 NOTE — ED Triage Notes (Signed)
 Pt presents with c/o bilateral leg swelling, worse on the right leg that has a wound on the back side of the leg. Home health nurse reports that the wound has been bandaged but has changed in swelling and redness since her visit last week. Nurse reported that the wound had some green drainage to it.

## 2024-02-02 NOTE — H&P (Signed)
 " History and Physical  Krystel Fletchall FMW:[id number] DOB: 06-Jul-1955 DOA: 02/02/2024  PCP: [name], NP   Chief Complaint: Lower extremity swelling, right leg redness  HPI: Christine Jacobson is a 69 y.o. female with medical history significant for diastolic CHF, HOCM, HLD, moderate TR, A-fib on Eliquis , HTN and obesity, gout and recurrent cellulitis who presented to the ED for evaluation of worsening lower extremity swelling and erythema.  Patient reports she has a nurse that comes once a week to check on her and do wound care by wrapping her legs. The wrap fell off a few days ago and over the last 2 days, she has noticed worsening redness to her right lower extremity with associated worsening swelling to both legs. She endorsed mild shortness of breath, leg pain/spasms and intermittent chills but denies any fevers, chest pain, nausea, vomiting, abdominal pain, dizziness or headache. Her home health nurse noticed her right leg looked worse and was weeping purulent, greenish drainage so she advised patient to present to the ED for further evaluation.  ED Course: Initial vitals show patient afebrile, HR 80-100, SBP 110-140s, SpO2 100% on room air. Initial labs significant for proBNP 3554, WBC 11.7, alk phos 316, normal renal function. Pt received IV vancomycin . TRH was consulted for admission.   Review of Systems: Please see HPI for pertinent positives and negatives. A complete 10 system review of systems are otherwise negative.  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   2D ECHO - EF >55%, normal   HTN (hypertension)    Hyperprolactinemia    dx in her 18, took meds, self d/c a while back   Liver function test abnormality    normal when repeated   Mild  hyperlipidemia    Obesity    Palpitations    negative stress echo in November 2012 with normal LV function; mild LVH, proximal septal thickening with narrow LVOT and mild gradient; mild MR and TR; Cardionet showed PACs in November 2012   Pyoderma gangrenosa (HCC)    Shortness of breath 07/11/2011   MET TEST   Past Surgical History:  Procedure Laterality Date   BIOPSY  08/10/2022   Procedure: BIOPSY;  Surgeon: Burnette Fallow, MD;  Location: [facility];  Service: Gastroenterology;;   CARDIOVERSION N/A 02/01/2022   Procedure: CARDIOVERSION;  Surgeon: Sheena Pugh, DO;  Location: [facility];  Service: Cardiovascular;  Laterality: N/A;   CARDIOVERSION N/A 09/20/2022   Procedure: CARDIOVERSION;  Surgeon: Cherrie Toribio SAUNDERS, MD;  Location: [facility];  Service: Cardiovascular;  Laterality: N/A;   COLONOSCOPY WITH PROPOFOL  Left 08/10/2022   Procedure: COLONOSCOPY WITH PROPOFOL ;  Surgeon: Burnette Fallow, MD;  Location: [facility];  Service: Gastroenterology;  Laterality: Left;   ESOPHAGOGASTRODUODENOSCOPY (EGD) WITH PROPOFOL  Left 08/10/2022   Procedure: ESOPHAGOGASTRODUODENOSCOPY (EGD) WITH PROPOFOL ;  Surgeon: Burnette Fallow, MD;  Location: [facility];  Service: Gastroenterology;  Laterality: Left;   POLYPECTOMY  08/10/2022   Procedure: POLYPECTOMY;  Surgeon: Burnette Fallow, MD;  Location: [facility];  Service: Gastroenterology;;   RIGHT HEART CATH N/A 10/15/2021   Procedure: RIGHT HEART CATH;  Surgeon: Elmira Newman PARAS, MD;  Location: [facility];  Service: Cardiovascular;  Laterality: N/A;   RIGHT/LEFT HEART CATH AND CORONARY ANGIOGRAPHY N/A 10/13/2023   Procedure: RIGHT/LEFT HEART CATH AND  CORONARY ANGIOGRAPHY;  Surgeon: Rolan Ezra RAMAN, MD;  Location: [facility];  Service: Cardiovascular;  Laterality: N/A;   SKIN GRAFT  2006   porcine, R leg   TEE WITHOUT CARDIOVERSION N/A 09/20/2022   Procedure: TRANSESOPHAGEAL ECHOCARDIOGRAM;  Surgeon: Cherrie Toribio SAUNDERS,  MD;  Location: [facility];  Service: Cardiovascular;  Laterality: N/A;   Social History:  reports that she has quit smoking. Her smoking use included cigarettes. She has a 4 pack-year smoking history. She has never used smokeless tobacco. She reports that she does not currently use alcohol after a past usage of about 1.0 standard drink of alcohol per week. She reports that she does not use drugs.  Allergies[1]  Family History  Problem Relation Age of Onset   Heart disease Mother        endocarditis   Ovarian cancer Mother    Emphysema Father    Coronary artery disease Other        F in his 42   Diabetes Other        GP   Cancer Neg Hx      Prior to Admission medications  Medication Sig Start Date End Date Taking? Authorizing Provider  acetaminophen  (TYLENOL ) 325 MG tablet Take 2 tablets (650 mg total) by mouth every 6 (six) hours as needed for mild pain (pain score 1-3) or fever (or Fever >/= 101). 10/14/23   Sebastian Toribio GAILS, MD  allopurinol  (ZYLOPRIM ) 100 MG tablet Take 100 mg by mouth every morning. 12/08/23   [provider]  apixaban  (ELIQUIS ) 5 MG TABS tablet TAKE ONE TABLET (5 MG TOTAL) BY MOUTH TWICE DAILY AT 9AM & 5PM (NEEDS CARDIOLOGY APPOINTMENT FOR REFILLS, CALL OFFICE [phone]. THANK YOU) 01/23/24   Tobb, Kardie, DO  atorvastatin  (LIPITOR ) 80 MG tablet Take 1 tablet (80 mg total) by mouth daily. Patient not taking: Reported on 11/14/2023 10/14/23   Sebastian Toribio GAILS, MD  ezetimibe  (ZETIA ) 10 MG tablet Take 1 tablet (10 mg total) by mouth daily for cholesterol. Patient not taking: Reported on 01/04/2024 10/30/23     furosemide  (LASIX ) 40 MG tablet Take 1 tablet (40 mg total) by mouth in the morning. 10/14/23   Sebastian Toribio GAILS, MD  hydrocortisone  cream 1 % Apply 1 Application topically 3 (three) times daily as needed for itching (minor skin irritation). 11/27/23   Odell Celinda Balo, MD  metoprolol  succinate (TOPROL -XL) 50 MG 24 hr tablet Take 1 tablet  (50 mg total) by mouth daily. Take with or immediately following a meal. 11/16/23   Gonfa, Taye T, MD  pantoprazole  (PROTONIX ) 40 MG tablet Take 1 tablet (40 mg total) by mouth daily. 10/14/23   Sebastian Toribio GAILS, MD  Potassium Chloride  ER 20 MEQ TBCR Take 1 tablet (20 mEq total) by mouth every other day. 05/31/23   Tobb, Kardie, DO  spironolactone  (ALDACTONE ) 25 MG tablet Take 1 tablet (25 mg total) by mouth daily. 01/10/24   Fairy Frames, MD  QUEtiapine  (SEROQUEL ) 25 MG tablet Take 0.5 tablets (12.5 mg total) by mouth at bedtime for 7 days, THEN 1 tablet (25 mg total) at bedtime for mood and insomnia. Patient not taking: Reported on 05/11/2023 04/20/23 07/13/23      Physical Exam: BP (!) 113/102   Pulse 100   Temp 97.6 F (36.4 C) (Oral)   Resp 20   SpO2 100%  General: Pleasant, well-appearing  laying in bed. No acute distress. HEENT: [place]/AT. Anicteric sclera CV: Regular rate. Irregular rhythm. Harsh  holosystolic murmur. No LE edema Pulmonary: Lungs CTAB. Normal effort. No wheezing or rales. Abdominal: Soft, nontender, nondistended. Normal bowel sounds. Extremities: 2+ BLE pitting edema up to knee. Faint DP pulses. Normal ROM. Skin: Warm and dry. Lower right leg with significant erythema and edema with some weeping of clear fluid, mild warmth and tender to palpation. LLE with some chronic skin changes Neuro: A&Ox3. Moves all extremities. Normal sensation to light touch. No focal deficit. Psych: Mildly anxious              Labs on Admission:  Basic Metabolic Panel: Recent Labs  Lab 02/02/24 1412  NA 138  K 4.6  CL 107  CO2 20*  GLUCOSE 91  BUN 17  CREATININE 0.96  CALCIUM  10.1   Liver Function Tests: Recent Labs  Lab 02/02/24 1412  AST 68*  ALT 33  ALKPHOS 316*  BILITOT 0.4  PROT 7.5  ALBUMIN  3.5   No results for input(s): LIPASE, AMYLASE in the last 168 hours. No results for input(s): AMMONIA in the last 168 hours. CBC: Recent Labs  Lab 02/02/24 1412   WBC 11.7*  NEUTROABS 10.1*  HGB 13.0  HCT 42.6  MCV 86.1  PLT 356   Cardiac Enzymes: No results for input(s): CKTOTAL, CKMB, CKMBINDEX, TROPONINI in the last 168 hours. BNP (last 3 results) Recent Labs    05/11/23 0306 11/13/23 0920 11/21/23 1215  BNP 551.0* 915.1* 741.8*    ProBNP (last 3 results) Recent Labs    10/12/23 0458 01/04/24 0850 02/02/24 1412  PROBNP 4,823.0* 2,651.0* 3,554.0*    CBG: No results for input(s): GLUCAP in the last 168 hours.  Radiological Exams on Admission: DG Chest Port 1 View Result Date: 02/02/2024 CLINICAL DATA:  Swelling.  Shortness of breath. EXAM: PORTABLE CHEST 1 VIEW COMPARISON:  None Available. FINDINGS: Both lungs are clear. Heart size is upper limits of normal but stable. Trachea is midline. Negative for a pneumothorax. No acute bone abnormality. IMPRESSION: No active disease. Electronically Signed   By: Juliene Balder M.D.   On: 02/02/2024 13:58   Assessment/Plan Maebry Asa Fath is a 69 y.o. female with medical history significant for diastolic CHF, HOCM, HLD, moderate TR, A-fib on Eliquis , HTN and obesity, gout and recurrent cellulitis who presented to the ED for evaluation of worsening lower extremity swelling and erythema.   # Cellulitis of the right lower extremity - Patient with history of cellulitis presented with worsening right lower extremity redness, swelling and drainage - On exam patient has significant edema, erythema with some clear drainage to her right lower leg and foot - Patient with evidence of cellulitis likely worsened by her lower extremity edema - Greenish drainage noted by [facility] nurse at home prior to arrival, will continue IV vancomycin  - Wound care consulted, appreciate recs; patient would likely benefit from [employer] boot - Trend CBC, fever curve - Wound consider imaging if no further improvement in cellulitis with IV antibiotics  # Acute on chronic diastolic HF # Hx of HOCM - Last TTE on 09/2023  shows severe LVH with prominent in the proximal septum, chordal SAM, EF >75%, and severely dilated LA - Pt presented with mild shortness of breath significantly extremity edema, proBNP elevated to 3500 - Start IV lasix  40 mg daily - Continue Toprol -XL and potassium supplementation - Strict I&O, daily weights - Maintain K+ > 4.0, Mag > 2.0 - Telemetry  # HTN - BP stable with SBP in the 120-140s - Continue on Toprol -XL  # Persistent  A-fib - Remains in A-fib but rate controlled - Continue Eliquis  and Toprol -XL - Telemetry  # HLD - Continue atorvastatin   # Gout - Continue allopurinol   # GERD - Resume Protonix   # Obesity  DVT prophylaxis: Eliquis     Code Status: Limited: Do not attempt resuscitation (DNR) -DNR-LIMITED -Do Not Intubate/DNI   Consults called: None  Family Communication: No family at bedside  Severity of Illness: The appropriate patient status for this patient is INPATIENT. Inpatient status is judged to be reasonable and necessary in order to provide the required intensity of service to ensure the patient's safety. The patient's presenting symptoms, physical exam findings, and initial radiographic and laboratory data in the context of their chronic comorbidities is felt to place them at high risk for further clinical deterioration. Furthermore, it is not anticipated that the patient will be medically stable for discharge from the hospital within 2 midnights of admission.   * I certify that at the point of admission it is my clinical judgment that the patient will require inpatient hospital care spanning beyond 2 midnights from the point of admission due to high intensity of service, high risk for further deterioration and high frequency of surveillance required.*  Level of care: Telemetry   I personally spent a total of 75 minutes in the care of the patient today including preparing to see the patient, getting/reviewing separately obtained history, performing a  medically appropriate exam/evaluation, placing orders, and documenting clinical information in the EHR.   Lou Claretta HERO, MD 02/02/2024, 5:08 PM [facility] Pager: [phone] Isaiah 41:10   If 7PM-7AM, please contact night-coverage [email or web address] Password [email or web address]     [1]  Allergies Allergen Reactions   Porcine (Pork) Protein-Containing Drug Products Other (See Comments)    Religious preference   "

## 2024-02-03 ENCOUNTER — Other Ambulatory Visit: Payer: Self-pay

## 2024-02-03 DIAGNOSIS — L03115 Cellulitis of right lower limb: Secondary | ICD-10-CM | POA: Diagnosis not present

## 2024-02-03 LAB — CBC
HCT: 36.3 % (ref 36.0–46.0)
Hemoglobin: 11.2 g/dL — ABNORMAL LOW (ref 12.0–15.0)
MCH: 26.4 pg (ref 26.0–34.0)
MCHC: 30.9 g/dL (ref 30.0–36.0)
MCV: 85.4 fL (ref 80.0–100.0)
Platelets: 303 K/uL (ref 150–400)
RBC: 4.25 MIL/uL (ref 3.87–5.11)
RDW: 17.6 % — ABNORMAL HIGH (ref 11.5–15.5)
WBC: 11 K/uL — ABNORMAL HIGH (ref 4.0–10.5)
nRBC: 0 % (ref 0.0–0.2)

## 2024-02-03 LAB — BASIC METABOLIC PANEL WITH GFR
Anion gap: 10 (ref 5–15)
BUN: 17 mg/dL (ref 8–23)
CO2: 22 mmol/L (ref 22–32)
Calcium: 9.5 mg/dL (ref 8.9–10.3)
Chloride: 107 mmol/L (ref 98–111)
Creatinine, Ser: 1.03 mg/dL — ABNORMAL HIGH (ref 0.44–1.00)
GFR, Estimated: 58 mL/min — ABNORMAL LOW
Glucose, Bld: 99 mg/dL (ref 70–99)
Potassium: 3.7 mmol/L (ref 3.5–5.1)
Sodium: 138 mmol/L (ref 135–145)

## 2024-02-03 LAB — GLUCOSE, CAPILLARY: Glucose-Capillary: 148 mg/dL — ABNORMAL HIGH (ref 70–99)

## 2024-02-03 NOTE — Progress Notes (Signed)
 " PROGRESS NOTE    Christine Jacobson  FMW:[id number] DOB: [birth year] DOA: 02/02/2024 PCP: [name], NP    Brief Narrative:  69 year old with history of diastolic congestive heart failure, hokum, hyperlipidemia, A-fib on Eliquis , hypertension and obesity, gout and recurrent cellulitis presented for worsening lower extremity swelling and erythema also reported mild shortness of breath, mostly leg pain and spasms and intermittent chills.  In the emergency room initially controlled heart rate, developed rapid A-fib overnight improved with metoprolol .  proBNP 3554.  WC count was 11.7.  Electrolytes are adequate.  Admitted with IV antibiotics, also given IV diuresis with fluid overload.  Subjective: Patient seen and examined.  Overnight events noted.  Had developed rapid A-fib but was not that symptomatic.  Patient tells me she has moderate pain on her right leg and the medicine they gave her helped.  She used Tylenol .  Patient tells me that she always have leg swelling but they are slightly bigger than usual.  Denies any shortness of breath.  She thinks it is her anxiety with leg pain.   Assessment & Plan:   Cellulitis and erythema of the right lower extremity, chronic lymphedema: Presented with worsening lymphedema and evidence of infection. Blood cultures were not collected. Patient started on vancomycin  Clinically monitor, currently does not have need for surgical drainage. Aggressive wound care.  Acute on chronic diastolic congestive heart failure, history of hokum: Recent echocardiogram with severe LVH and prominent septum, EF more than 75% Presented with shortness of breath and significant extremity edema, proBNP 3500 At home on Lasix  40 mg daily.  Currently on Lasix  40 mg IV daily.  Patient started on gentle diuresis.  Will avoid aggressive diuresis given HOCM. Continue Toprol  XL.  Potassium is adequate.  Persistent A-fib with RVR: Rate controlled A-fib at home. On Toprol   XL.  On Eliquis .  Will keep patient on as needed beta-blockers to control his heart rate.  Probably aggravated by acute illness.  Hyperlipidemia: On a statin.  Gout: On allopurinol .  GERD: On Protonix .    DVT prophylaxis:  apixaban  (ELIQUIS ) tablet 5 mg   Code Status: DNR/DNI Family Communication: None at the bedside. Disposition Plan: Status is: Inpatient Remains inpatient appropriate because: IV antibiotics, IV diuresis     Consultants:  None  Procedures:  None  Antimicrobials:  Vancomycin  1/16---     Objective: Vitals:   02/03/24 0500 02/03/24 0548 02/03/24 0615 02/03/24 0700  BP: 113/88  110/82 119/75  Pulse: 94  90   Resp: 16 14 14 17   Temp:  97.9 F (36.6 C)    TempSrc:  Oral    SpO2: 98%  100%   Weight:        Intake/Output Summary (Last 24 hours) at 02/03/2024 0818 Last data filed at 02/02/2024 2054 Gross per 24 hour  Intake 200 ml  Output 900 ml  Net -700 ml   Filed Weights   02/02/24 1700  Weight: 83.3 kg    Examination:  General: Debilitated and chronically sick looking.  Not in any distress. Cardiovascular: S1-S2 normal.  Irregular.  Currently rate controlled. Respiratory: Bilateral clear.  On room air.  No added sounds. Gastrointestinal: Soft.  Nontender.  Bowel sound present. Ext: Right lower extremity with chronic lymphedematous changes, redness and erythema, dressing intact.  Picture in the chart. Left lower extremity with chronic edema, chronic skin changes and erythema.  No abscess or cellulitis. .     Data Reviewed: I have personally reviewed following labs and imaging studies  CBC: Recent Labs  Lab 02/02/24 1412 02/03/24 0442  WBC 11.7* 11.0*  NEUTROABS 10.1*  --   HGB 13.0 11.2*  HCT 42.6 36.3  MCV 86.1 85.4  PLT 356 303   Basic Metabolic Panel: Recent Labs  Lab 02/02/24 1412 02/03/24 0442  NA 138 138  K 4.6 3.7  CL 107 107  CO2 20* 22  GLUCOSE 91 99  BUN 17 17  CREATININE 0.96 1.03*  CALCIUM  10.1 9.5    GFR: Estimated Creatinine Clearance: 53 mL/min (A) (by C-G formula based on SCr of 1.03 mg/dL (H)). Liver Function Tests: Recent Labs  Lab 02/02/24 1412  AST 68*  ALT 33  ALKPHOS 316*  BILITOT 0.4  PROT 7.5  ALBUMIN  3.5   No results for input(s): LIPASE, AMYLASE in the last 168 hours. No results for input(s): AMMONIA in the last 168 hours. Coagulation Profile: No results for input(s): INR, PROTIME in the last 168 hours. Cardiac Enzymes: No results for input(s): CKTOTAL, CKMB, CKMBINDEX, TROPONINI in the last 168 hours. BNP (last 3 results) Recent Labs    10/12/23 0458 01/04/24 0850 02/02/24 1412  PROBNP 4,823.0* 2,651.0* 3,554.0*   HbA1C: No results for input(s): HGBA1C in the last 72 hours. CBG: No results for input(s): GLUCAP in the last 168 hours. Lipid Profile: No results for input(s): CHOL, HDL, LDLCALC, TRIG, CHOLHDL, LDLDIRECT in the last 72 hours. Thyroid Function Tests: No results for input(s): TSH, T4TOTAL, FREET4, T3FREE, THYROIDAB in the last 72 hours. Anemia Panel: No results for input(s): VITAMINB12, FOLATE, FERRITIN, TIBC, IRON , RETICCTPCT in the last 72 hours. Sepsis Labs: Recent Labs  Lab 02/02/24 1420 02/02/24 1854  LATICACIDVEN 2.0* 1.5    No results found for this or any previous visit (from the past 240 hours).       Radiology Studies: DG Chest Port 1 View Result Date: 02/02/2024 CLINICAL DATA:  Swelling.  Shortness of breath. EXAM: PORTABLE CHEST 1 VIEW COMPARISON:  None Available. FINDINGS: Both lungs are clear. Heart size is upper limits of normal but stable. Trachea is midline. Negative for a pneumothorax. No acute bone abnormality. IMPRESSION: No active disease. Electronically Signed   By: Juliene Balder M.D.   On: 02/02/2024 13:58        Scheduled Meds:  allopurinol   100 mg Oral q morning   apixaban   5 mg Oral BID   atorvastatin   80 mg Oral Daily   furosemide   40 mg  Intravenous Daily   metoprolol  succinate  50 mg Oral Daily   pantoprazole   40 mg Oral Daily   potassium chloride   20 mEq Oral QODAY   Continuous Infusions:  vancomycin        LOS: 1 day    Time spent: 51 minutes    Renato Applebaum, MD [facility]  "

## 2024-02-03 NOTE — Progress Notes (Signed)
" ° ° ° °  Patient Name: Christine Jacobson           DOB: 07/07/1955  MRN: [id number]      Admission Date: 02/02/2024  Attending Provider: Lou Claretta HERO, MD  Primary Diagnosis: Cellulitis of right lower extremity   Level of care: Progressive   OVERNIGHT EVENT   Notified by RN that patient has sustaining HR 140-150s.  BP stable.  EKG interpreted as A-fib with RVR. Patient denies pain, palpitations.  Endorses shortness of breath.  She has a known history of persistent A-fib, and takes Eliquis  and Toprol  XL.   Heart rate improved after 5 mg IV Lopressor  dose.  Patient endorses feeling better, HR is 100-110's.  Maintain K+ > 4.0, Mag > 2.0  Continue telemetry monitoring, level of care progressive.   Chrishawn Kring, DNP, ACNPC- AG [facility] [place]    "

## 2024-02-04 DIAGNOSIS — L03115 Cellulitis of right lower limb: Secondary | ICD-10-CM | POA: Diagnosis not present

## 2024-02-04 MED ORDER — HYDROXYZINE HCL 10 MG PO TABS
10.0000 mg | ORAL_TABLET | Freq: Four times a day (QID) | ORAL | Status: DC | PRN
  Administered 2024-02-04 – 2024-02-07 (×4): 10 mg via ORAL
  Filled 2024-02-04 (×4): qty 1

## 2024-02-04 MED ORDER — FUROSEMIDE 40 MG PO TABS
40.0000 mg | ORAL_TABLET | Freq: Every day | ORAL | Status: DC
  Administered 2024-02-04 – 2024-02-09 (×6): 40 mg via ORAL
  Filled 2024-02-04 (×6): qty 1

## 2024-02-04 NOTE — Evaluation (Signed)
 Occupational Therapy Evaluation Patient Details Name: Christine Jacobson MRN: [id number] DOB: 1955-02-18 Today's Date: 02/04/2024   History of Present Illness   69 year old female admitted on 02/02/24 for cellulitis and erythema of the right lower extremity, chronic lymphedema. PMH: diastolic congestive heart failure, HOCM, hyperlipidemia, A-fib on Eliquis , hypertension, obesity, gout and recurrent cellulitis     Clinical Impressions The pt is currently presenting below her baseline level of functioning for ADL management. She is limited by the below listed deficits (see OT problem list). During the session today, she required CGA to min assist for sit to stand, lower body dressing, simulated toileting and for taking lateral steps along the edge of the bed using a RW. She was further noted to be with BLE edema, RLE pain, and slight deconditioning. She will benefit from further OT services to maximize her ADL performance and to decrease the risk for restricted participation in meaningful activities. The pt stated she lives alone and does not feel as though she could safely manage her daily activities and mobility in the home, given her functional limitations. As such, she is interested in short-term SNF rehab. She also admitted she knows she is nearing the need to pursue longer term care options, such as ALF vs. ILF.       If plan is discharge home, recommend the following:   A little help with walking and/or transfers;A little help with bathing/dressing/bathroom;Assistance with cooking/housework;Help with stairs or ramp for entrance;Assist for transportation     Functional Status Assessment   Patient has had a recent decline in their functional status and demonstrates the ability to make significant improvements in function in a reasonable and predictable amount of time.     Equipment Recommendations   Tub/shower bench     Recommendations for Other Services          Precautions/Restrictions   Precautions Precautions: Fall Restrictions Weight Bearing Restrictions Per Provider Order: No     Mobility Bed Mobility Overal bed mobility: Needs Assistance Bed Mobility: Supine to Sit, Sit to Supine     Supine to sit: Supervision Sit to supine: Supervision        Transfers Overall transfer level: Needs assistance Equipment used: Rolling walker (2 wheels) Transfers: Sit to/from Stand Sit to Stand: Contact guard assist, From elevated surface                  Balance     Sitting balance-Leahy Scale: Good       Standing balance-Leahy Scale: Fair                  ADL either performed or assessed with clinical judgement   ADL Overall ADL's : Needs assistance/impaired Eating/Feeding: Independent;Sitting   Grooming: Set up;Sitting   Upper Body Bathing: Set up;Sitting   Lower Body Bathing: Minimal assistance;Sit to/from stand;Sitting/lateral leans   Upper Body Dressing : Set up;Sitting   Lower Body Dressing: Minimal assistance;Sit to/from stand   Toilet Transfer: Contact guard assist;Ambulation;Rolling walker (2 wheels);Grab bars   Toileting- Clothing Manipulation and Hygiene: Contact guard assist;Sit to/from stand;Minimal assistance Toileting - Clothing Manipulation Details (indicate cue type and reason): at bathroom level             Vision   Additional Comments: She correctly read the time depicted on the wall clock.            Pertinent Vitals/Pain Pain Assessment Pain Assessment: 0-10 Pain Score: 2  Pain Location: RLE Pain Intervention(s): Monitored during session, Limited  activity within patient's tolerance     Extremity/Trunk Assessment Upper Extremity Assessment Upper Extremity Assessment: RUE deficits/detail;LUE deficits/detail;Right hand dominant RUE Deficits / Details: AROM WFL. Gross strength 4/5 to 4+/5 LUE Deficits / Details: AROM WFL. Gross strength 4/5 to 4+/5   Lower Extremity  Assessment Lower Extremity Assessment: Generalized weakness;RLE deficits/detail;LLE deficits/detail RLE Deficits / Details: AROM WFL LLE Deficits / Details: AROM WFL      Communication Communication Communication: No apparent difficulties   Cognition Arousal: Alert Behavior During Therapy: Anxious               OT - Cognition Comments: Oriented to person and place. Pt looked at her phone to recall and state the date.                 Following commands: Intact       Cueing  General Comments   Cueing Techniques: Verbal cues              Home Living Family/patient expects to be discharged to:: Private residence Living Arrangements: Alone Available Help at Discharge: Friend(s);Available PRN/intermittently Type of Home: Apartment Home Access: Stairs to enter Entrance Stairs-Number of Steps: 4 Entrance Stairs-Rails: Left Home Layout: One level     Bathroom Shower/Tub: Tub/shower unit         Home Equipment: Cane - single Librarian, Academic (2 wheels)          Prior Functioning/Environment Prior Level of Function : Independent/Modified Independent             Mobility Comments:  (Occasional use of a cane and RW.) ADLs Comments:  (Modified independent to independent with ADLs, does not drive, and was able to do simple cooking and cleaning though some tasks have gotten more difficult to perform.)    OT Problem List: Decreased strength;Impaired balance (sitting and/or standing);Decreased knowledge of use of DME or AE;Pain;Increased edema   OT Treatment/Interventions: Self-care/ADL training;Therapeutic exercise;Energy conservation;DME and/or AE instruction;Therapeutic activities;Balance training;Patient/family education      OT Goals(Current goals can be found in the care plan section)   Acute Rehab OT Goals OT Goal Formulation: With patient Time For Goal Achievement: 02/18/24 Potential to Achieve Goals: Good ADL Goals Pt Will Perform  Grooming: with modified independence;standing Pt Will Perform Lower Body Dressing: with modified independence;sit to/from stand Pt Will Transfer to Toilet: with modified independence;ambulating;grab bars Pt Will Perform Toileting - Clothing Manipulation and hygiene: with modified independence;sit to/from stand   OT Frequency:  Min 2X/week       AM-PAC OT 6 Clicks Daily Activity     Outcome Measure Help from another person eating meals?: None Help from another person taking care of personal grooming?: A Little Help from another person toileting, which includes using toliet, bedpan, or urinal?: A Little Help from another person bathing (including washing, rinsing, drying)?: A Little Help from another person to put on and taking off regular upper body clothing?: A Little Help from another person to put on and taking off regular lower body clothing?: A Little 6 Click Score: 19   End of Session Equipment Utilized During Treatment: Rolling walker (2 wheels) Nurse Communication: Other (comment) (pt requesting medication for anxiety)  Activity Tolerance: Patient tolerated treatment well Patient left: in bed;with call bell/phone within reach;with bed alarm set  OT Visit Diagnosis: Muscle weakness (generalized) (M62.81);Pain Pain - Right/Left: Right Pain - part of body: Leg                Time: 8381-8362 OT  Time Calculation (min): 19 min Charges:  OT General Charges $OT Visit: 1 Visit OT Evaluation $OT Eval Moderate Complexity: 1 Mod    Delanna JINNY Lesches, OTR/L 02/04/2024, 4:58 PM

## 2024-02-04 NOTE — Progress Notes (Signed)
 " PROGRESS NOTE    Christine Jacobson  FMW:[id number] DOB: 12/31/1955 DOA: 02/02/2024 PCP: [name], NP    Brief Narrative:  69 year old with history of diastolic congestive heart failure, HOCM, hyperlipidemia, A-fib on Eliquis , hypertension and obesity, gout and recurrent cellulitis presented for worsening lower extremity swelling and erythema also reported mild shortness of breath, mostly leg pain and spasms and intermittent chills.  In the emergency room initially controlled heart rate, developed rapid A-fib overnight improved with metoprolol .  proBNP 3554.  WC count was 11.7.  Electrolytes are adequate.  Admitted with IV antibiotics, also given IV diuresis with fluid overload.  Subjective: Patient seen and examined.  Reported slight improvement of left leg swelling.  Denies any chest pain or shortness of breath.  On room air.  Eager to mobility today. Dressing opened and wound inspected.  Picture below.  Slight improvement today.  Hopefully we can arrange more wound care for her at home and discharge tomorrow.   Assessment & Plan:   Cellulitis and erythema of the right lower extremity, chronic lymphedema: Presented with worsening lymphedema and evidence of infection. Blood cultures were not collected. Patient started on vancomycin  Clinically monitor, currently does not have need for surgical drainage. Will need aggressive wound care ongoing.  Acute on chronic diastolic congestive heart failure, history of HOCM: Recent echocardiogram with severe LVH and prominent septum, EF more than 75% Presented with shortness of breath and significant extremity edema, proBNP 3500 At home on Lasix  40 mg daily.  Currently on Lasix  40 mg IV daily.  Will avoid aggressive diuresis given HOCM. Continue Toprol  XL.  Potassium is adequate.  Resume home dose of Lasix .  Persistent A-fib with RVR: Rate controlled A-fib at home. On Toprol  XL.  On Eliquis .  Will keep patient on as needed beta-blockers  to control his heart rate.  Probably aggravated by acute illness.  Hyperlipidemia: On a statin.  Gout: On allopurinol .  GERD: On Protonix .    DVT prophylaxis:  apixaban  (ELIQUIS ) tablet 5 mg   Code Status: DNR/DNI Family Communication: None at the bedside. Disposition Plan: Status is: Inpatient Remains inpatient appropriate because: IV antibiotics, wound care     Consultants:  None  Procedures:  None  Antimicrobials:  Vancomycin  1/16---     Objective: Vitals:   02/04/24 0032 02/04/24 0500 02/04/24 0549 02/04/24 1023  BP: 119/77  111/62 108/83  Pulse: 95  94 97  Resp: 19  19   Temp: 98.2 F (36.8 C)  98.2 F (36.8 C)   TempSrc: Oral  Oral   SpO2: 98%  96%   Weight:  80.7 kg    Height:        Intake/Output Summary (Last 24 hours) at 02/04/2024 1053 Last data filed at 02/04/2024 0532 Gross per 24 hour  Intake 730.3 ml  Output 400 ml  Net 330.3 ml   Filed Weights   02/02/24 1700 02/03/24 1603 02/04/24 0500  Weight: 83.3 kg 81.5 kg 80.7 kg    Examination:  General: Debilitated and chronically sick looking.  Not in any distress.  Pleasant interaction. Cardiovascular: S1-S2 normal.  Irregular.  Currently rate controlled. Respiratory: Bilateral clear.  On room air.  No added sounds. Gastrointestinal: Soft.  Nontender.  Bowel sound present. Ext: Right lower extremity with chronic lymphedematous changes, redness and erythema, clear drainage with some bleeding. Left lower extremity with chronic edema, chronic skin changes and erythema.  No abscess or cellulitis.   .     Data Reviewed: I have  personally reviewed following labs and imaging studies  CBC: Recent Labs  Lab 02/02/24 1412 02/03/24 0442  WBC 11.7* 11.0*  NEUTROABS 10.1*  --   HGB 13.0 11.2*  HCT 42.6 36.3  MCV 86.1 85.4  PLT 356 303   Basic Metabolic Panel: Recent Labs  Lab 02/02/24 1412 02/03/24 0442  NA 138 138  K 4.6 3.7  CL 107 107  CO2 20* 22  GLUCOSE 91 99  BUN 17 17   CREATININE 0.96 1.03*  CALCIUM  10.1 9.5   GFR: Estimated Creatinine Clearance: 52.2 mL/min (A) (by C-G formula based on SCr of 1.03 mg/dL (H)). Liver Function Tests: Recent Labs  Lab 02/02/24 1412  AST 68*  ALT 33  ALKPHOS 316*  BILITOT 0.4  PROT 7.5  ALBUMIN  3.5   No results for input(s): LIPASE, AMYLASE in the last 168 hours. No results for input(s): AMMONIA in the last 168 hours. Coagulation Profile: No results for input(s): INR, PROTIME in the last 168 hours. Cardiac Enzymes: No results for input(s): CKTOTAL, CKMB, CKMBINDEX, TROPONINI in the last 168 hours. BNP (last 3 results) Recent Labs    10/12/23 0458 01/04/24 0850 02/02/24 1412  PROBNP 4,823.0* 2,651.0* 3,554.0*   HbA1C: No results for input(s): HGBA1C in the last 72 hours. CBG: Recent Labs  Lab 02/03/24 2002  GLUCAP 148*   Lipid Profile: No results for input(s): CHOL, HDL, LDLCALC, TRIG, CHOLHDL, LDLDIRECT in the last 72 hours. Thyroid Function Tests: No results for input(s): TSH, T4TOTAL, FREET4, T3FREE, THYROIDAB in the last 72 hours. Anemia Panel: No results for input(s): VITAMINB12, FOLATE, FERRITIN, TIBC, IRON , RETICCTPCT in the last 72 hours. Sepsis Labs: Recent Labs  Lab 02/02/24 1420 02/02/24 1854  LATICACIDVEN 2.0* 1.5    No results found for this or any previous visit (from the past 240 hours).       Radiology Studies: DG Chest Port 1 View Result Date: 02/02/2024 CLINICAL DATA:  Swelling.  Shortness of breath. EXAM: PORTABLE CHEST 1 VIEW COMPARISON:  None Available. FINDINGS: Both lungs are clear. Heart size is upper limits of normal but stable. Trachea is midline. Negative for a pneumothorax. No acute bone abnormality. IMPRESSION: No active disease. Electronically Signed   By: Juliene Balder M.D.   On: 02/02/2024 13:58        Scheduled Meds:  allopurinol   100 mg Oral q morning   apixaban   5 mg Oral BID   atorvastatin   80 mg  Oral Daily   furosemide   40 mg Oral Daily   metoprolol  succinate  50 mg Oral Daily   pantoprazole   40 mg Oral Daily   potassium chloride   20 mEq Oral QODAY   Continuous Infusions:  vancomycin  Stopped (02/03/24 1521)     LOS: 2 days    Time spent: 51 minutes    Renato Applebaum, MD [facility]  "

## 2024-02-04 NOTE — Evaluation (Signed)
 Physical Therapy Evaluation Patient Details Name: Christine Jacobson MRN: [id number] DOB: Mar 20, 1955 Today's Date: 02/04/2024  History of Present Illness  69 year old female admitted on 02/02/24 for Cellulitis and erythema of the right lower extremity, chronic lymphedema. PMHx: diastolic congestive heart failure, HOCM, hyperlipidemia, A-fib on Eliquis , hypertension, obesity, gout and recurrent cellulitis  Clinical Impression  Pt admitted with above diagnosis.  Pt currently with functional limitations due to the deficits listed below (see PT Problem List). Pt will benefit from acute skilled PT to increase their independence and safety with mobility to allow discharge.  Pt agreeable to mobilize however upon initiating movement c/o feeling off however unable to describe further.  Pt did stand x2 and march in place once but feeling off as well as LE pain limiting farther progression of movement.  Pt requested to return to supine.  Pt lives alone and does not feel she can manage safely at home in current condition.  Patient will benefit from continued inpatient follow up therapy, <3 hours/day.          If plan is discharge home, recommend the following: A little help with walking and/or transfers;A little help with bathing/dressing/bathroom;Help with stairs or ramp for entrance   Can travel by private vehicle        Equipment Recommendations None recommended by PT  Recommendations for Other Services       Functional Status Assessment Patient has had a recent decline in their functional status and demonstrates the ability to make significant improvements in function in a reasonable and predictable amount of time.     Precautions / Restrictions Precautions Precautions: Fall      Mobility  Bed Mobility Overal bed mobility: Needs Assistance Bed Mobility: Supine to Sit, Sit to Supine     Supine to sit: Contact guard, HOB elevated Sit to supine: Contact guard assist, HOB elevated    General bed mobility comments: increased time and effort    Transfers Overall transfer level: Needs assistance Equipment used: Rolling walker (2 wheels) Transfers: Sit to/from Stand Sit to Stand: Min assist           General transfer comment: cues for hand placement, assist to rise and control descent    Ambulation/Gait Ambulation/Gait assistance: Min assist   Assistive device: Rolling walker (2 wheels)       Pre-gait activities: pregait activity of marching in place however limited by feeling off and also LE pain increased; HR 126 bpm in afib per telemetry    Stairs            Wheelchair Mobility     Tilt Bed    Modified Rankin (Stroke Patients Only)       Balance Overall balance assessment: Mild deficits observed, not formally tested, History of Falls                                           Pertinent Vitals/Pain Pain Assessment Pain Assessment: Faces Faces Pain Scale: Hurts even more Pain Location: LEs in dependent position Pain Descriptors / Indicators: Sore, Tender, Aching Pain Intervention(s): Monitored during session, Repositioned    Home Living Family/patient expects to be discharged to:: Private residence Living Arrangements: Alone Available Help at Discharge: Friend(s);Available PRN/intermittently Type of Home: Apartment Home Access: Stairs to enter Entrance Stairs-Rails: Left Entrance Stairs-Number of Steps: 4   Home Layout: One level Home Equipment: Cane - single point;Rolling  Walker (2 wheels)      Prior Function Prior Level of Function : Independent/Modified Independent;History of Falls (last six months)             Mobility Comments: uses [facility], doesn't drive, has groceries delivered       Extremity/Trunk Assessment        Lower Extremity Assessment Lower Extremity Assessment: Generalized weakness    Cervical / Trunk Assessment Cervical / Trunk Assessment: Normal  Communication    Communication Communication: No apparent difficulties    Cognition Arousal: Alert Behavior During Therapy: WFL for tasks assessed/performed, Anxious   PT - Cognitive impairments: No apparent impairments                         Following commands: Intact       Cueing       General Comments      Exercises     Assessment/Plan    PT Assessment Patient needs continued PT services  PT Problem List Decreased strength;Decreased mobility;Decreased activity tolerance;Decreased balance;Decreased knowledge of use of DME       PT Treatment Interventions DME instruction;Gait training;Therapeutic exercise;Stair training;Functional mobility training;Patient/family education;Therapeutic activities;Balance training    PT Goals (Current goals can be found in the Care Plan section)  Acute Rehab PT Goals PT Goal Formulation: With patient Time For Goal Achievement: 02/25/24 Potential to Achieve Goals: Good    Frequency Min 3X/week     Co-evaluation               AM-PAC PT 6 Clicks Mobility  Outcome Measure Help needed turning from your back to your side while in a flat bed without using bedrails?: A Little Help needed moving from lying on your back to sitting on the side of a flat bed without using bedrails?: A Little Help needed moving to and from a bed to a chair (including a wheelchair)?: A Lot Help needed standing up from a chair using your arms (e.g., wheelchair or bedside chair)?: A Lot Help needed to walk in hospital room?: A Lot Help needed climbing 3-5 steps with a railing? : Total 6 Click Score: 13    End of Session Equipment Utilized During Treatment: Gait belt Activity Tolerance: Patient limited by fatigue Patient left: in bed;with call bell/phone within reach;with bed alarm set Nurse Communication: Mobility status PT Visit Diagnosis: Difficulty in walking, not elsewhere classified (R26.2);Muscle weakness (generalized) (M62.81)    Time:  8769-8754 PT Time Calculation (min) (ACUTE ONLY): 15 min   Charges:   PT Evaluation $PT Eval Low Complexity: 1 Low   PT General Charges $$ ACUTE PT VISIT: 1 Visit        Tari PT, DPT Physical Therapist Acute Rehabilitation Services Office: [phone]   Tari CROME [place] 02/04/2024, 1:58 PM

## 2024-02-05 DIAGNOSIS — L03115 Cellulitis of right lower limb: Secondary | ICD-10-CM | POA: Diagnosis not present

## 2024-02-05 MED ORDER — DOXYCYCLINE HYCLATE 100 MG PO TABS
100.0000 mg | ORAL_TABLET | Freq: Two times a day (BID) | ORAL | Status: AC
  Administered 2024-02-05 – 2024-02-10 (×11): 100 mg via ORAL
  Filled 2024-02-05 (×11): qty 1

## 2024-02-05 NOTE — Progress Notes (Signed)
 Mobility Specialist - Progress Note   02/05/24 1208  Mobility  Activity Pivoted/transferred from bed to chair  Level of Assistance Minimal assist, patient does 75% or more  Assistive Device Other (Comment) (HHA)  Range of Motion/Exercises Active  Activity Response Tolerated well  Mobility visit 1 Mobility  Mobility Specialist Start Time (ACUTE ONLY) 1158  Mobility Specialist Stop Time (ACUTE ONLY) 1208  Mobility Specialist Time Calculation (min) (ACUTE ONLY) 10 min   Pt was found in bed and agreeable to mobilize with encouragement. C/o BLE pain. At EOS was left on recliner chair with all needs met. Call bell in reach.   Erminio Leos,  Mobility Specialist Can be reached via Secure Chat

## 2024-02-05 NOTE — TOC Initial Note (Signed)
 Transition of Care [facility]) - Initial/Assessment Note    Patient Details  Name: Christine Jacobson MRN: [id number] Date of Birth: 1955/12/05  Transition of Care [facility]) CM/SW Contact:    [name], LCSW Phone Number: 02/05/2024, 2:04 PM  Clinical Narrative:                 CSW met with the pt at the bedside to discuss recommendations for SNF placement. The pt agreed and stated no preference for a facility. CSW explained the SNF placement process, noting that the pt will require insurance authorization.ICM to follow.   Expected Discharge Plan: Skilled Nursing Facility Barriers to Discharge: Barriers Resolved   Patient Goals and CMS Choice Patient states their goals for this hospitalization and ongoing recovery are:: SNF to get stronger          Expected Discharge Plan and Services       Living arrangements for the past 2 months: Apartment                                      Prior Living Arrangements/Services Living arrangements for the past 2 months: Apartment Lives with:: Self Patient language and need for interpreter reviewed:: Yes Do you feel safe going back to the place where you live?: Yes      Need for Family Participation in Patient Care: No (Comment) Care giver support system in place?: No (comment)   Criminal Activity/Legal Involvement Pertinent to Current Situation/Hospitalization: No - Comment as needed  Activities of Daily Living   ADL Screening (condition at time of admission) Independently performs ADLs?: Yes (appropriate for developmental age) Is the patient deaf or have difficulty hearing?: No Does the patient have difficulty seeing, even when wearing glasses/contacts?: No Does the patient have difficulty concentrating, remembering, or making decisions?: No  Permission Sought/Granted                  Emotional Assessment Appearance:: Appears stated age Attitude/Demeanor/Rapport: Gracious Affect (typically observed):  Accepting Orientation: : Oriented to Place, Oriented to  Time, Oriented to Situation, Oriented to Self   Psych Involvement: No (comment)  Admission diagnosis:  PAF (paroxysmal atrial fibrillation) (HCC) [I48.0] Cellulitis of right lower extremity [L03.115] Patient Active Problem List   Diagnosis Date Noted   Cellulitis of right lower extremity 02/02/2024   Gastroesophageal reflux disease 02/02/2024   Chronic gout without tophus 02/02/2024   (HFpEF) heart failure with preserved ejection fraction (HCC) 11/22/2023   Syncope and collapse 11/21/2023   Vulvar cellulitis 11/13/2023   Ovarian mass 11/13/2023   Ovarian mass, left 11/13/2023   Sepsis (HCC) 11/13/2023   Coronary artery calcification seen on CAT scan 10/13/2023   Elevated troponin 10/12/2023   Abnormal stress test 10/12/2023   Acute diastolic (congestive) heart failure (HCC) 10/11/2023   Persistent atrial fibrillation (HCC) 12/28/2022   Acute on chronic diastolic heart failure (HCC) 12/27/2022   Major depressive disorder, 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   MDD (major depressive disorder) 09/29/2022   Anxiety 09/16/2022   Acute exacerbation of CHF (congestive heart failure) (HCC) 09/15/2022   Atrial fibrillation with RVR (HCC) 09/15/2022   Leg swelling 09/15/2022   GIB (gastrointestinal bleeding) 08/05/2022   Acquired thrombophilia 08/05/2022   Chronic anticoagulation 08/05/2022   Acute blood loss anemia 08/04/2022   Left leg cellulitis 07/13/2022   Hypocarbia 07/13/2022   Normocytic anemia 07/13/2022   Cellulitis 06/02/2022  Sepsis due to cellulitis (HCC) 06/02/2022   AKI (acute kidney injury) 06/02/2022   Hyponatremia 06/02/2022   Dehydration 06/02/2022   Metabolic acidosis 06/02/2022   Pulmonary HTN (HCC)    Cellulitis and abscess of left lower extremity 10/13/2021   PAF (paroxysmal atrial fibrillation) (HCC) 07/28/2021   Obesity (BMI 30-39.9)    Liver function test abnormality     Essential hypertension    Chronic diastolic heart failure (HCC) 12/15/2016   HOCM (hypertrophic obstructive cardiomyopathy) (HCC) 01/22/2016   Lower extremity edema 09/17/2014   History of syncope 12/05/2013   Syncope 12/05/2013   Pneumonia 11/23/2012   Shortness of breath 07/11/2011   Hypertension 11/23/2010   Chest pain of uncertain etiology 10/18/2010   Murmur 10/18/2010   Palpitations 09/23/2010   Mixed hyperlipidemia    Elevated BP    Hyperprolactinemia (HCC)    Pyoderma gangrenosa (HCC)    PCP:  [name], NP Pharmacy:   [facility] [address] [place] [address] Phone: [phone] Fax: [phone]     Social Drivers of Health (SDOH) Social History: SDOH Screenings   Food Insecurity: No Food Insecurity (02/03/2024)  Housing: Low Risk (02/03/2024)  Transportation Needs: No Transportation Needs (02/03/2024)  Utilities: Not At Risk (02/03/2024)  Depression (PHQ2-9): Low Risk (10/26/2021)  Financial Resource Strain: Low Risk (12/03/2021)  Social Connections: Socially Isolated (02/03/2024)  Tobacco Use: Medium Risk (02/02/2024)   SDOH Interventions:     Readmission Risk Interventions    11/16/2023   10:02 AM 05/12/2023    4:15 PM 12/28/2022   11:28 AM  Readmission Risk Prevention Plan  Transportation Screening Complete Complete Complete  PCP or Specialist Appt within 3-5 Days Complete  Complete  HRI or Home Care Consult Complete  Complete  Social Work Consult for Recovery Care Planning/Counseling Complete  Complete  Palliative Care Screening Not Applicable  Not Applicable  Medication Review [employer])  Complete Complete  HRI or Home Care Consult  Complete   Palliative Care Screening  Not Applicable   Skilled Nursing Facility  Not Applicable

## 2024-02-05 NOTE — Progress Notes (Signed)
 I spoke with Christine Jacobson to offer support.  She never expected to be disabled at this age and is grateful for any support that she is able to have.  I provided a prayer for healing and for peace of mind and plan to follow up with her tomorrow for additional support.

## 2024-02-05 NOTE — Progress Notes (Signed)
 " PROGRESS NOTE    Christine Jacobson  FMW:[id number] DOB: [birth year] DOA: 02/02/2024 PCP: [name], NP    Brief Narrative:  69 year old with history of diastolic congestive heart failure, HOCM, hyperlipidemia, A-fib on Eliquis , hypertension and obesity, gout and recurrent cellulitis presented for worsening lower extremity swelling and erythema also reported mild shortness of breath, mostly leg pain and spasms and intermittent chills.  In the emergency room initially controlled heart rate, developed rapid A-fib overnight improved with metoprolol .  proBNP 3554.  WC count was 11.7.  Electrolytes are adequate.  Admitted with IV antibiotics, also given IV diuresis with fluid overload.  Subjective: Patient seen and examined.  Anxious.  She is asking whether she is ever going to heal her right leg.  We discussed about poor circulation, chronic venous stasis and chronic skin issues that will hamper healing.  Recommended aggressive wound care.  Patient does not think she can take care of herself at home. Referred to SNF for wound care and therapies. Received vancomycin  x 3 days, can change to oral doxycycline .   Assessment & Plan:   Cellulitis and erythema of the right lower extremity, chronic lymphedema: Presented with worsening lymphedema and evidence of infection. Blood cultures were not collected. Patient on vancomycin  x 3 days. Clinically monitor, currently does not have need for surgical drainage. Will need aggressive wound care ongoing. Changing to doxycycline  for 11 days, will treat with 2 weeks of antibiotic therapy.  Acute on chronic diastolic congestive heart failure, history of HOCM: Recent echocardiogram with severe LVH and prominent septum, EF more than 75% Presented with shortness of breath and significant extremity edema, proBNP 3500 At home on Lasix  40 mg daily.  Resume home dose.  Toprol -XL.  Euvolemic today.  Persistent A-fib with RVR: Rate controlled A-fib at  home. On Toprol  XL.  On Eliquis .    Hyperlipidemia: On a statin.  Gout: On allopurinol .  GERD: On Protonix .    DVT prophylaxis:  apixaban  (ELIQUIS ) tablet 5 mg   Code Status: DNR/DNI Family Communication: None at the bedside. Disposition Plan: Status is: Inpatient Remains inpatient appropriate because: medically stable.  Needs SNF.     Consultants:  None  Procedures:  None  Antimicrobials:  Vancomycin  1/16--- 1/19 Doxycycline  1/19---     Objective: Vitals:   02/04/24 1023 02/04/24 1334 02/04/24 2115 02/05/24 0549  BP: 108/83 (!) 104/59 99/68 119/86  Pulse: 97 98 81 84  Resp:  18 16 14   Temp:  98.9 F (37.2 C) 98.1 F (36.7 C) 98.1 F (36.7 C)  TempSrc:  Oral Oral Oral  SpO2:  98% 97% 97%  Weight:    80.6 kg  Height:        Intake/Output Summary (Last 24 hours) at 02/05/2024 1059 Last data filed at 02/04/2024 1300 Gross per 24 hour  Intake 240 ml  Output --  Net 240 ml   Filed Weights   02/03/24 1603 02/04/24 0500 02/05/24 0549  Weight: 81.5 kg 80.7 kg 80.6 kg    Examination:  General: Debilitated and chronically sick looking.  Not in any distress.  Anxious. Cardiovascular: S1-S2 normal.  Irregular.  Currently rate controlled. Respiratory: Bilateral clear.  On room air.  No added sounds. Gastrointestinal: Soft.  Nontender.  Bowel sound present. Ext: Right lower extremity with chronic lymphedematous changes, redness and erythema, clear drainage with some bleeding. Left lower extremity with chronic edema, chronic skin changes and erythema.  No abscess or cellulitis.   .     Data Reviewed:  I have personally reviewed following labs and imaging studies  CBC: Recent Labs  Lab 02/02/24 1412 02/03/24 0442  WBC 11.7* 11.0*  NEUTROABS 10.1*  --   HGB 13.0 11.2*  HCT 42.6 36.3  MCV 86.1 85.4  PLT 356 303   Basic Metabolic Panel: Recent Labs  Lab 02/02/24 1412 02/03/24 0442  NA 138 138  K 4.6 3.7  CL 107 107  CO2 20* 22  GLUCOSE 91 99   BUN 17 17  CREATININE 0.96 1.03*  CALCIUM  10.1 9.5   GFR: Estimated Creatinine Clearance: 52.2 mL/min (A) (by C-G formula based on SCr of 1.03 mg/dL (H)). Liver Function Tests: Recent Labs  Lab 02/02/24 1412  AST 68*  ALT 33  ALKPHOS 316*  BILITOT 0.4  PROT 7.5  ALBUMIN  3.5   No results for input(s): LIPASE, AMYLASE in the last 168 hours. No results for input(s): AMMONIA in the last 168 hours. Coagulation Profile: No results for input(s): INR, PROTIME in the last 168 hours. Cardiac Enzymes: No results for input(s): CKTOTAL, CKMB, CKMBINDEX, TROPONINI in the last 168 hours. BNP (last 3 results) Recent Labs    10/12/23 0458 01/04/24 0850 02/02/24 1412  PROBNP 4,823.0* 2,651.0* 3,554.0*   HbA1C: No results for input(s): HGBA1C in the last 72 hours. CBG: Recent Labs  Lab 02/03/24 2002  GLUCAP 148*   Lipid Profile: No results for input(s): CHOL, HDL, LDLCALC, TRIG, CHOLHDL, LDLDIRECT in the last 72 hours. Thyroid Function Tests: No results for input(s): TSH, T4TOTAL, FREET4, T3FREE, THYROIDAB in the last 72 hours. Anemia Panel: No results for input(s): VITAMINB12, FOLATE, FERRITIN, TIBC, IRON , RETICCTPCT in the last 72 hours. Sepsis Labs: Recent Labs  Lab 02/02/24 1420 02/02/24 1854  LATICACIDVEN 2.0* 1.5    No results found for this or any previous visit (from the past 240 hours).       Radiology Studies: No results found.       Scheduled Meds:  allopurinol   100 mg Oral q morning   apixaban   5 mg Oral BID   atorvastatin   80 mg Oral Daily   doxycycline   100 mg Oral Q12H   furosemide   40 mg Oral Daily   metoprolol  succinate  50 mg Oral Daily   pantoprazole   40 mg Oral Daily   potassium chloride   20 mEq Oral QODAY   Continuous Infusions:     LOS: 3 days    Time spent: 40 minutes    Renato Applebaum, MD [facility]  "

## 2024-02-06 DIAGNOSIS — L03115 Cellulitis of right lower limb: Secondary | ICD-10-CM | POA: Diagnosis not present

## 2024-02-06 MED ORDER — ORAL CARE MOUTH RINSE: 15.0000 mL | OROMUCOSAL | Status: DC | PRN

## 2024-02-06 NOTE — Progress Notes (Signed)
 30 Day PASRR Note   Patient Details  Name: Christine Jacobson Date of Birth: May 03, 1955   Transition of Care [facility]) CM/SW Contact:    [name], LCSW Phone Number: 02/06/2024, 10:21 AM  To Whom It May Concern:  Please be advised that this patient will require a short-term nursing home stay - anticipated 30 days or less for rehabilitation and strengthening.   The plan is for return home.

## 2024-02-06 NOTE — NC FL2 (Signed)
 " [place]  MEDICAID FL2 LEVEL OF CARE FORM     IDENTIFICATION  Patient Name: Christine Jacobson Birthdate: [birth year] Sex: female Admission Date (Current Location): 02/02/2024  [facility] and [place] Number:  Producer, Television/film/video and Address:  [facility],  [address] [place] [address]      Provider Number: [id number]  Attending Physician Name and Address:  Raenelle Coria, MD  Relative Name and Phone Number:       Current Level of Care: Hospital Recommended Level of Care: Skilled Nursing Facility Prior Approval Number:    Date Approved/Denied:   PASRR Number: Pending  Discharge Plan: SNF    Current Diagnoses: Patient Active Problem List   Diagnosis Date Noted   Cellulitis of right lower extremity 02/02/2024   Gastroesophageal reflux disease 02/02/2024   Chronic gout without tophus 02/02/2024   (HFpEF) heart failure with preserved ejection fraction (HCC) 11/22/2023   Syncope and collapse 11/21/2023   Vulvar cellulitis 11/13/2023   Ovarian mass 11/13/2023   Ovarian mass, left 11/13/2023   Sepsis (HCC) 11/13/2023   Coronary artery calcification seen on CAT scan 10/13/2023   Elevated troponin 10/12/2023   Abnormal stress test 10/12/2023   Acute diastolic (congestive) heart failure (HCC) 10/11/2023   Persistent atrial fibrillation (HCC) 12/28/2022   Acute on chronic diastolic heart failure (HCC) 12/27/2022   Major depressive disorder, 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   MDD (major depressive disorder) 09/29/2022   Anxiety 09/16/2022   Acute exacerbation of CHF (congestive heart failure) (HCC) 09/15/2022   Atrial fibrillation with RVR (HCC) 09/15/2022   Leg swelling 09/15/2022   GIB (gastrointestinal bleeding) 08/05/2022   Acquired thrombophilia 08/05/2022   Chronic anticoagulation 08/05/2022   Acute blood loss anemia 08/04/2022   Left leg cellulitis 07/13/2022   Hypocarbia 07/13/2022   Normocytic anemia 07/13/2022    Cellulitis 06/02/2022   Sepsis due to cellulitis (HCC) 06/02/2022   AKI (acute kidney injury) 06/02/2022   Hyponatremia 06/02/2022   Dehydration 06/02/2022   Metabolic acidosis 06/02/2022   Pulmonary HTN (HCC)    Cellulitis and abscess of left lower extremity 10/13/2021   PAF (paroxysmal atrial fibrillation) (HCC) 07/28/2021   Obesity (BMI 30-39.9)    Liver function test abnormality    Essential hypertension    Chronic diastolic heart failure (HCC) 12/15/2016   HOCM (hypertrophic obstructive cardiomyopathy) (HCC) 01/22/2016   Lower extremity edema 09/17/2014   History of syncope 12/05/2013   Syncope 12/05/2013   Pneumonia 11/23/2012   Shortness of breath 07/11/2011   Hypertension 11/23/2010   Chest pain of uncertain etiology 10/18/2010   Murmur 10/18/2010   Palpitations 09/23/2010   Mixed hyperlipidemia    Elevated BP    Hyperprolactinemia (HCC)    Pyoderma gangrenosa (HCC)     Orientation RESPIRATION BLADDER Height & Weight     Self, Time, Situation, Place  Normal Incontinent Weight: 177 lb 11.1 oz (80.6 kg) Height:  5' 4 (162.6 cm)  BEHAVIORAL SYMPTOMS/MOOD NEUROLOGICAL BOWEL NUTRITION STATUS      Continent Diet (heart healthy)  AMBULATORY STATUS COMMUNICATION OF NEEDS Skin   Limited Assist Verbally Normal                       Personal Care Assistance Level of Assistance  Bathing, Feeding, Dressing Bathing Assistance: Limited assistance Feeding assistance: Independent Dressing Assistance: Limited assistance     Functional Limitations Info  Sight, Hearing, Speech Sight Info: Adequate Hearing Info: Adequate Speech Info: Adequate  SPECIAL CARE FACTORS FREQUENCY  PT (By licensed PT), OT (By licensed OT)     PT Frequency: 5 x a week OT Frequency: 5 x a week            Contractures Contractures Info: Not present    Additional Factors Info  Code Status, Allergies Code Status Info: DNR Allergies Info: Porcine (Pork) Protein-containing Drug  Products           Current Medications (02/06/2024):  This is the current hospital active medication list Current Facility-Administered Medications  Medication Dose Route Frequency Provider Last Rate Last Admin   acetaminophen  (TYLENOL ) tablet 650 mg  650 mg Oral Q6H PRN Amponsah, Prosper M, MD   650 mg at 02/06/24 9072   Or   acetaminophen  (TYLENOL ) suppository 650 mg  650 mg Rectal Q6H PRN Amponsah, Prosper M, MD       allopurinol  (ZYLOPRIM ) tablet 100 mg  100 mg Oral q morning Amponsah, Prosper M, MD   100 mg at 02/06/24 0919   apixaban  (ELIQUIS ) tablet 5 mg  5 mg Oral BID Amponsah, Prosper M, MD   5 mg at 02/06/24 9080   atorvastatin  (LIPITOR ) tablet 80 mg  80 mg Oral Daily Amponsah, Prosper M, MD   80 mg at 02/06/24 9080   bisacodyl  (DULCOLAX) EC tablet 5 mg  5 mg Oral Daily PRN Amponsah, Prosper M, MD       doxycycline  (VIBRA -TABS) tablet 100 mg  100 mg Oral Q12H Raenelle Coria, MD   100 mg at 02/06/24 0919   furosemide  (LASIX ) tablet 40 mg  40 mg Oral Daily Ghimire, Kuber, MD   40 mg at 02/06/24 0919   hydrOXYzine  (ATARAX ) tablet 10 mg  10 mg Oral Q6H PRN Ghimire, Kuber, MD   10 mg at 02/06/24 0344   metoprolol  succinate (TOPROL -XL) 24 hr tablet 50 mg  50 mg Oral Daily Amponsah, Prosper M, MD   50 mg at 02/06/24 0919   ondansetron  (ZOFRAN ) tablet 4 mg  4 mg Oral Q6H PRN Amponsah, Prosper M, MD       Or   ondansetron  (ZOFRAN ) injection 4 mg  4 mg Intravenous Q6H PRN Amponsah, Prosper M, MD       pantoprazole  (PROTONIX ) EC tablet 40 mg  40 mg Oral Daily Lou Claretta HERO, MD   40 mg at 02/06/24 9080   potassium chloride  (KLOR-CON ) packet 20 mEq  20 mEq Oral QODAY Amponsah, Prosper M, MD   20 mEq at 02/05/24 0829   senna-docusate (Senokot-S) tablet 1 tablet  1 tablet Oral QHS PRN Amponsah, Prosper M, MD         Discharge Medications: Please see discharge summary for a list of discharge medications.  Relevant Imaging Results:  Relevant Lab Results:   Additional  Information [id number]  Tawni HERO Kolbey Teichert, LCSW     "

## 2024-02-06 NOTE — Progress Notes (Signed)
 Physical Therapy Treatment Patient Details Name: Christine Jacobson MRN: [id number] DOB: 1955/03/10 Today's Date: 02/06/2024   History of Present Illness 69 year old female admitted on 02/02/24 for Cellulitis and erythema of the right lower extremity, chronic lymphedema. PMHx: diastolic congestive heart failure, HOCM, hyperlipidemia, A-fib on Eliquis , hypertension, obesity, gout and recurrent cellulitis    PT Comments  Pt resting in bed with RN present completing dressing change to RLE. Pt reports pain controlled for the most part. Pt declines wanting to get out of bed again today as she spent most of her morning up. PT provided pt with supine and seated HEP for BLE to continue improve her strength and maintain joint ROM, red theraband provided for additional resistance. PT will continue to follow while pt admitted to acute hospital to work on strength, mobility and activity tolerance.     If plan is discharge home, recommend the following: A little help with walking and/or transfers;A little help with bathing/dressing/bathroom;Help with stairs or ramp for entrance   Can travel by private vehicle        Equipment Recommendations  None recommended by PT    Recommendations for Other Services       Precautions / Restrictions Precautions Precautions: Fall Restrictions Weight Bearing Restrictions Per Provider Order: No     Balance        Communication Communication Communication: No apparent difficulties  Cognition Arousal: Alert Behavior During Therapy: WFL for tasks assessed/performed   PT - Cognitive impairments: No apparent impairments                         Following commands: Intact      Cueing Cueing Techniques: Verbal cues  Exercises Other Exercises Other Exercises: pt deferred OOB mobility today as she had already been up to recliner and now back to bed with dressing change to RLE just finished. PT provided pt with supine and seated HEP with red theraband  for continued strengthening of BLE. Goal for 3x10 daily as tolerated. Pt understanding and appreciative of exercises    General Comments        Pertinent Vitals/Pain Pain Assessment Pain Assessment: Faces Faces Pain Scale: Hurts a little bit Pain Location: RLE Pain Descriptors / Indicators: Sore, Tender, Aching Pain Intervention(s): Premedicated before session    PT Goals (current goals can now be found in the care plan section) Acute Rehab PT Goals Patient Stated Goal: get home and walk again PT Goal Formulation: With patient Time For Goal Achievement: 02/25/24 Potential to Achieve Goals: Good    Frequency    Min 3X/week      PT Plan      Co-evaluation              AM-PAC PT 6 Clicks Mobility   Outcome Measure  Help needed turning from your back to your side while in a flat bed without using bedrails?: A Little Help needed moving from lying on your back to sitting on the side of a flat bed without using bedrails?: A Little Help needed moving to and from a bed to a chair (including a wheelchair)?: A Lot Help needed standing up from a chair using your arms (e.g., wheelchair or bedside chair)?: A Lot Help needed to walk in hospital room?: A Lot Help needed climbing 3-5 steps with a railing? : Total 6 Click Score: 13    End of Session   Activity Tolerance: Patient limited by fatigue Patient left: in bed;with call bell/phone  within reach;with bed alarm set Nurse Communication: Mobility status PT Visit Diagnosis: Difficulty in walking, not elsewhere classified (R26.2);Muscle weakness (generalized) (M62.81)     Time: 8653-8597 PT Time Calculation (min) (ACUTE ONLY): 16 min  Charges:    $Therapeutic Exercise: 8-22 mins                       Isaiah DEL. Batoul Limes, PT, DPT   [employer] 02/06/2024, 3:26 PM

## 2024-02-06 NOTE — Progress Notes (Signed)
 " PROGRESS NOTE    Christine Jacobson  FMW:[id number] DOB: [birth year] DOA: 02/02/2024 PCP: [name], NP    Brief Narrative:  69 year old with history of diastolic congestive heart failure, HOCM, hyperlipidemia, A-fib on Eliquis , hypertension and obesity, gout and recurrent cellulitis presented for worsening lower extremity swelling and erythema also reported mild shortness of breath, mostly leg pain and spasms and intermittent chills.  In the emergency room initially controlled heart rate, developed rapid A-fib overnight improved with metoprolol .  proBNP 3554.  WC count was 11.7.  Electrolytes are adequate.  Admitted with IV antibiotics, also given IV diuresis with fluid overload.  Subjective: Patient seen and examined.  She is usually anxious about her condition, becoming more disabled due to leg pain and ulcers.  No other overnight events.  She has agreed to go to a SNF.  Bed search in process.   Assessment & Plan:   Cellulitis and erythema of the right lower extremity, chronic lymphedema: Presented with worsening lymphedema and evidence of infection. Blood cultures were not collected. Patient on vancomycin  x 3 days. Clinically monitor, currently does not have need for surgical drainage. Will need aggressive wound care ongoing. Changing to doxycycline , will treat with 2 weeks of antibiotic therapy.  Acute on chronic diastolic congestive heart failure, history of HOCM: Recent echocardiogram with severe LVH and prominent septum, EF more than 75% Presented with shortness of breath and significant extremity edema, proBNP 3500 At home on Lasix  40 mg daily.  Resume home dose.  Toprol -XL.  Euvolemic today.  Persistent A-fib with RVR: Rate controlled A-fib at home. On Toprol  XL.  On Eliquis .    Hyperlipidemia: On a statin.  Gout: On allopurinol .  GERD: On Protonix .    DVT prophylaxis:  apixaban  (ELIQUIS ) tablet 5 mg   Code Status: DNR/DNI Family Communication: None at  the bedside. Disposition Plan: Status is: Inpatient Remains inpatient appropriate because: medically stable.  Needs SNF.     Consultants:  None  Procedures:  None  Antimicrobials:  Vancomycin  1/16--- 1/19 Doxycycline  1/19---     Objective: Vitals:   02/05/24 0549 02/05/24 1339 02/05/24 2057 02/06/24 0534  BP: 119/86 97/71 105/74 (!) 128/103  Pulse: 84 85 89 89  Resp: 14 16 18 18   Temp: 98.1 F (36.7 C) 97.9 F (36.6 C) 98.1 F (36.7 C) 98.2 F (36.8 C)  TempSrc: Oral Oral Oral Oral  SpO2: 97% 96% 97% 97%  Weight: 80.6 kg     Height:        Intake/Output Summary (Last 24 hours) at 02/06/2024 1031 Last data filed at 02/06/2024 0600 Gross per 24 hour  Intake --  Output 600 ml  Net -600 ml   Filed Weights   02/03/24 1603 02/04/24 0500 02/05/24 0549  Weight: 81.5 kg 80.7 kg 80.6 kg    Examination:  General: Chronically sick looking and debilitated.  Not in any distress. Cardiovascular: S1-S2 normal.  Irregular.  Currently rate controlled. Respiratory: Bilateral clear.  On room air.  No added sounds. Gastrointestinal: Soft.  Nontender.  Bowel sound present. Ext: Right lower extremity with chronic lymphedematous changes, redness and erythema, clear drainage with some bleeding. Left lower extremity with chronic edema, chronic skin changes and erythema.  No abscess or cellulitis. Redness and some areas of weeping on the right leg.  .     Data Reviewed: I have personally reviewed following labs and imaging studies  CBC: Recent Labs  Lab 02/02/24 1412 02/03/24 0442  WBC 11.7* 11.0*  NEUTROABS 10.1*  --  HGB 13.0 11.2*  HCT 42.6 36.3  MCV 86.1 85.4  PLT 356 303   Basic Metabolic Panel: Recent Labs  Lab 02/02/24 1412 02/03/24 0442  NA 138 138  K 4.6 3.7  CL 107 107  CO2 20* 22  GLUCOSE 91 99  BUN 17 17  CREATININE 0.96 1.03*  CALCIUM  10.1 9.5   GFR: Estimated Creatinine Clearance: 52.2 mL/min (A) (by C-G formula based on SCr of 1.03 mg/dL  (H)). Liver Function Tests: Recent Labs  Lab 02/02/24 1412  AST 68*  ALT 33  ALKPHOS 316*  BILITOT 0.4  PROT 7.5  ALBUMIN  3.5   No results for input(s): LIPASE, AMYLASE in the last 168 hours. No results for input(s): AMMONIA in the last 168 hours. Coagulation Profile: No results for input(s): INR, PROTIME in the last 168 hours. Cardiac Enzymes: No results for input(s): CKTOTAL, CKMB, CKMBINDEX, TROPONINI in the last 168 hours. BNP (last 3 results) Recent Labs    10/12/23 0458 01/04/24 0850 02/02/24 1412  PROBNP 4,823.0* 2,651.0* 3,554.0*   HbA1C: No results for input(s): HGBA1C in the last 72 hours. CBG: Recent Labs  Lab 02/03/24 2002  GLUCAP 148*   Lipid Profile: No results for input(s): CHOL, HDL, LDLCALC, TRIG, CHOLHDL, LDLDIRECT in the last 72 hours. Thyroid Function Tests: No results for input(s): TSH, T4TOTAL, FREET4, T3FREE, THYROIDAB in the last 72 hours. Anemia Panel: No results for input(s): VITAMINB12, FOLATE, FERRITIN, TIBC, IRON , RETICCTPCT in the last 72 hours. Sepsis Labs: Recent Labs  Lab 02/02/24 1420 02/02/24 1854  LATICACIDVEN 2.0* 1.5    No results found for this or any previous visit (from the past 240 hours).       Radiology Studies: No results found.       Scheduled Meds:  allopurinol   100 mg Oral q morning   apixaban   5 mg Oral BID   atorvastatin   80 mg Oral Daily   doxycycline   100 mg Oral Q12H   furosemide   40 mg Oral Daily   metoprolol  succinate  50 mg Oral Daily   pantoprazole   40 mg Oral Daily   potassium chloride   20 mEq Oral QODAY   Continuous Infusions:     LOS: 4 days    Time spent: 40 minutes    Renato Applebaum, MD [facility]  "

## 2024-02-07 ENCOUNTER — Telehealth: Payer: Self-pay | Admitting: *Deleted

## 2024-02-07 DIAGNOSIS — L03115 Cellulitis of right lower limb: Secondary | ICD-10-CM | POA: Diagnosis not present

## 2024-02-07 MED ORDER — MORPHINE SULFATE (PF) 2 MG/ML IV SOLN
1.0000 mg | INTRAVENOUS | Status: DC | PRN
  Administered 2024-02-07 – 2024-02-11 (×6): 1 mg via INTRAVENOUS
  Filled 2024-02-07 (×6): qty 1

## 2024-02-07 MED ORDER — GABAPENTIN 100 MG PO CAPS
100.0000 mg | ORAL_CAPSULE | Freq: Three times a day (TID) | ORAL | Status: DC
  Administered 2024-02-07 – 2024-02-15 (×26): 100 mg via ORAL
  Filled 2024-02-07 (×26): qty 1

## 2024-02-07 NOTE — Progress Notes (Signed)
 Heart Failure Navigator Progress Note Assessed for Heart & Vascular TOC clinic readiness. [facility] placed a consult because patient has a history of CHF. Patient irritated with receiving phone call in her room 1435 @ [facility].   Refused CHF education or TOC appointment at this time.  Shared that she will be going to SNF and has had home health providing her with care at home prior to this admission.  She shared that she has not been to see Dr. Sheena in awhile and did not need anything at this time.  Navigator will sign off at this time.  Charmaine Pines, RN, BSN [facility] Heart Failure Navigator Secure Chat Only

## 2024-02-07 NOTE — Progress Notes (Signed)
 " PROGRESS NOTE    Christine Jacobson  FMW:[id number] DOB: Jun 08, 1955 DOA: 02/02/2024 PCP: [name], NP     Brief Narrative:  Christine Jacobson is a 69 year old with history of diastolic congestive heart failure, HOCM, hyperlipidemia, A-fib on Eliquis , hypertension and obesity, gout and recurrent cellulitis presented for worsening lower extremity swelling and erythema also reported mild shortness of breath, mostly leg pain and spasms and intermittent chills.  In the emergency room initially controlled heart rate, developed rapid A-fib overnight improved with metoprolol .  proBNP 3554.  WC count was 11.7.  Electrolytes are adequate.  Admitted with IV antibiotics, also given IV diuresis with fluid overload.   New events last 24 hours / Subjective: Patient feeling anxious this morning, overwhelmed.  Patient seen with RN to examine wound.  Awaiting SNF bed placement.  Assessment & Plan:   Principal Problem:   Cellulitis of right lower extremity Active Problems:   Mixed hyperlipidemia   Essential hypertension   Acute on chronic diastolic heart failure (HCC)   Gastroesophageal reflux disease   Chronic gout without tophus   Right lower extremity cellulitis Chronic lymphedema - Appreciate wound care RN recommendation for local wound care - Now on doxycycline   Acute on chronic diastolic CHF History of HOCM - Lasix , Toprol   Persistent A-fib - Toprol , Eliquis   Hyperlipidemia - Lipitor   Gout - Allopurinol   GERD - Protonix   DVT prophylaxis:  apixaban  (ELIQUIS ) tablet 5 mg  Code Status: DNR Family Communication: None Disposition Plan: SNF pending placement Status is: Inpatient Remains inpatient appropriate because: Pending placement    Antimicrobials:  Anti-infectives (From admission, onward)    Start     Dose/Rate Route Frequency Ordered Stop   02/05/24 1145  doxycycline  (VIBRA -TABS) tablet 100 mg        100 mg Oral Every 12 hours 02/05/24 1058 02/10/24 2159    02/03/24 1400  vancomycin  (VANCOREADY) IVPB 1250 mg/250 mL  Status:  Discontinued        1,250 mg 166.7 mL/hr over 90 Minutes Intravenous Every 24 hours 02/02/24 1848 02/05/24 1058   02/02/24 1830  vancomycin  (VANCOREADY) IVPB 1250 mg/250 mL  Status:  Discontinued        1,250 mg 166.7 mL/hr over 90 Minutes Intravenous Every 24 hours 02/02/24 1755 02/02/24 1848   02/02/24 1315  vancomycin  (VANCOCIN ) IVPB 1000 mg/200 mL premix        1,000 mg 200 mL/hr over 60 Minutes Intravenous  Once 02/02/24 1314 02/02/24 1518        Objective: Vitals:   02/06/24 2007 02/07/24 0500 02/07/24 0539 02/07/24 1346  BP: 101/67  (!) 107/56 113/82  Pulse: 85  89 80  Resp: 18  16 18   Temp: 98.4 F (36.9 C)  98.6 F (37 C) 97.9 F (36.6 C)  TempSrc: Oral  Oral Oral  SpO2: 99%  96% 94%  Weight:  80 kg    Height:        Intake/Output Summary (Last 24 hours) at 02/07/2024 1430 Last data filed at 02/07/2024 0800 Gross per 24 hour  Intake 20 ml  Output 900 ml  Net -880 ml   Filed Weights   02/04/24 0500 02/05/24 0549 02/07/24 0500  Weight: 80.7 kg 80.6 kg 80 kg    Examination:  General exam: Appears anxious Skin:      Data Reviewed: I have personally reviewed following labs and imaging studies  CBC: Recent Labs  Lab 02/02/24 1412 02/03/24 0442  WBC 11.7* 11.0*  NEUTROABS 10.1*  --  HGB 13.0 11.2*  HCT 42.6 36.3  MCV 86.1 85.4  PLT 356 303   Basic Metabolic Panel: Recent Labs  Lab 02/02/24 1412 02/03/24 0442  NA 138 138  K 4.6 3.7  CL 107 107  CO2 20* 22  GLUCOSE 91 99  BUN 17 17  CREATININE 0.96 1.03*  CALCIUM  10.1 9.5   GFR: Estimated Creatinine Clearance: 52 mL/min (A) (by C-G formula based on SCr of 1.03 mg/dL (H)). Liver Function Tests: Recent Labs  Lab 02/02/24 1412  AST 68*  ALT 33  ALKPHOS 316*  BILITOT 0.4  PROT 7.5  ALBUMIN  3.5   No results for input(s): LIPASE, AMYLASE in the last 168 hours. No results for input(s): AMMONIA in the last  168 hours. Coagulation Profile: No results for input(s): INR, PROTIME in the last 168 hours. Cardiac Enzymes: No results for input(s): CKTOTAL, CKMB, CKMBINDEX, TROPONINI in the last 168 hours. BNP (last 3 results) Recent Labs    10/12/23 0458 01/04/24 0850 02/02/24 1412  PROBNP 4,823.0* 2,651.0* 3,554.0*   HbA1C: No results for input(s): HGBA1C in the last 72 hours. CBG: Recent Labs  Lab 02/03/24 2002  GLUCAP 148*   Lipid Profile: No results for input(s): CHOL, HDL, LDLCALC, TRIG, CHOLHDL, LDLDIRECT in the last 72 hours. Thyroid Function Tests: No results for input(s): TSH, T4TOTAL, FREET4, T3FREE, THYROIDAB in the last 72 hours. Anemia Panel: No results for input(s): VITAMINB12, FOLATE, FERRITIN, TIBC, IRON , RETICCTPCT in the last 72 hours. Sepsis Labs: Recent Labs  Lab 02/02/24 1420 02/02/24 1854  LATICACIDVEN 2.0* 1.5    No results found for this or any previous visit (from the past 240 hours).    Radiology Studies: No results found.    Scheduled Meds:  allopurinol   100 mg Oral q morning   apixaban   5 mg Oral BID   atorvastatin   80 mg Oral Daily   doxycycline   100 mg Oral Q12H   furosemide   40 mg Oral Daily   gabapentin   100 mg Oral TID   metoprolol  succinate  50 mg Oral Daily   pantoprazole   40 mg Oral Daily   potassium chloride   20 mEq Oral QODAY   Continuous Infusions:   LOS: 5 days   Time spent: 25 minutes   Delon Hoe, DO [facility] 02/07/2024, 2:30 PM   Available via Epic secure chat 7am-7pm After these hours, please refer to coverage provider listed on [email or web address]  "

## 2024-02-07 NOTE — Progress Notes (Addendum)
 SPIRITUAL CARE AND COUNSELING CONSULT NOTE   VISIT SUMMARY I met with Christine Jacobson to provide emotional and spiritual support. She is very isolated and feels the grief over how difficult the isolation is. She was tearful at times.  I provided supportive listening as she shared about how her health changes have impacted her life.  She is open to resources for support, including counseling. With her permission, I connected her with [facility] who have a engineer, water, case manager and transportation services and she was very grateful for this connection.  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 She is overwhelmed by all of the changes in her life, but is feeling some hope that she will feel more settled and more connected soon.    SPIRITUAL ENCOUNTER                                                                                                                                                                      Type of Visit: Initial Care provided to:: Patient Referral source: Patient request Reason for visit: Urgent spiritual support OnCall Visit: No   SPIRITUAL FRAMEWORK  Presenting Themes: Meaning/purpose/sources of inspiration, Significant life change, Coping tools, Courage hope and growth, Community and relationships, Impactful experiences and emotions Community/Connection: Limited Patient Stress Factors: Exhausted, Financial concerns, Health changes, Loss of control, Major life changes   GOALS   Clinical Care Goals: She is aware that she cannot care for herself at home and will be transitioning to SNF.   INTERVENTIONS   Spiritual Care Interventions Made: Established relationship of care and support, Compassionate presence, Reflective listening, Normalization of emotions, Narrative/life review, Explored values/beliefs/practices/strengths, Bereavement/grief support, Self-care teaching, Encouragement,  Supported grief process    INTERVENTION OUTCOMES   Outcomes: Connection to spiritual care, Awareness around self/spiritual resourses, Connection to values and goals of care, Reduced isolation, Patient family open to resources  [facility] CARE PLAN   Spiritual Care Issues Still Outstanding: Elia will continue to follow    If immediate needs arise, please contact [facility] Law / Behavioral Health 24 hour on call [phone]   Evins Lamarr Caldron, Chaplain  02/07/2024 5:00 PM

## 2024-02-07 NOTE — Progress Notes (Signed)
 OT Cancellation Note  Patient Details Name: Omar Orrego MRN: [id number] DOB: 11/25/1955   Cancelled Treatment:    Reason Eval/Treat Not Completed: Patient declined, no reason specified. Pt up in recliner although states that she did not sleep well last night and politely declined participating in OT this afternoon. Will follow up with pt at a later time as able.   Leita Howell, OTR/L,CBIS  Supplemental OT - [facility] and [facility] Secure Chat Preferred   02/07/2024, 3:39 PM

## 2024-02-07 NOTE — Telephone Encounter (Signed)
 Spoke with the patient and canceled appt for 1/23. Patient currently in the hospital and possible discharged to nursing home/assisted living. Patient ask to cancel appt and for our office to call her back the end of February/beginning of March for an appt.   Referred closed for now and will reopen if patient schedules an appt in the future.   Message routed to referring provider

## 2024-02-07 NOTE — TOC Progression Note (Addendum)
 Transition of Care [facility]) - Progression Note    Patient Details  Name: Christine Jacobson MRN: [id number] Date of Birth: 03-13-1955  [facility] CM/SW Contact  [name], LCSW Phone Number: 02/07/2024, 9:20 AM  Clinical Narrative:     CSW presented pt with bed offer, pt reports not happy with bed offer. She is requesting CSW to reach out to considering facilities. ICM to follow.   Adden 1:10pm CSW followed back up with the pt regarding additional bed offers. The pt stated, Im very overwhelmed. CSW explained that she can only provide the Medicare star ratings of each facility that has offered a bed. CSW suggested for the pt to contact the facilities and ask any questions she may have. CSW also informed the pt that insurance authorization would still be required. The pt requested that CSW follow back up with her. ICM to follow.   2:30pm Pt has chosen [facility]. CSW sent  message to [facility] to inform her of pt's choice. CSW to start insurance auth, ICM to follow.    3:30pm Pt is not managed by [facility]. CSW reached out to [place] at [facility] to inform her that insurance authorization would need to be initiated byt the facility. [facility] stated that they cannot accept pts who are not managed by [facility] and therefore rescinded the bed offer.  CSW spoke with the pt and provided an update. The pt chose [facility]. CSW informed [place] of the pts choice. Facility to [facility] authorization. ICM to follow.  Expected Discharge Plan: Skilled Nursing Facility Barriers to Discharge: Barriers Resolved               Expected Discharge Plan and Services       Living arrangements for the past 2 months: Apartment                                       Social Drivers of Health (SDOH) Interventions SDOH Screenings   Food Insecurity: No Food Insecurity (02/03/2024)  Housing: Low Risk (02/03/2024)  Transportation Needs: No Transportation Needs (02/03/2024)   Utilities: Not At Risk (02/03/2024)  Depression (PHQ2-9): Low Risk (10/26/2021)  Financial Resource Strain: Low Risk (12/03/2021)  Social Connections: Socially Isolated (02/03/2024)  Tobacco Use: Medium Risk (02/02/2024)    Readmission Risk Interventions    11/16/2023   10:02 AM 05/12/2023    4:15 PM 12/28/2022   11:28 AM  Readmission Risk Prevention Plan  Transportation Screening Complete Complete Complete  PCP or Specialist Appt within 3-5 Days Complete  Complete  HRI or Home Care Consult Complete  Complete  Social Work Consult for Recovery Care Planning/Counseling Complete  Complete  Palliative Care Screening Not Applicable  Not Applicable  Medication Review Oceanographer)  Complete Complete  HRI or Home Care Consult  Complete   Palliative Care Screening  Not Applicable   Skilled Nursing Facility  Not Applicable

## 2024-02-07 NOTE — Plan of Care (Signed)
  Problem: Education: Goal: Knowledge of General Education information will improve Description: Including pain rating scale, medication(s)/side effects and non-pharmacologic comfort measures Outcome: Progressing   Problem: Clinical Measurements: Goal: Ability to maintain clinical measurements within normal limits will improve Outcome: Progressing Goal: Will remain free from infection Outcome: Progressing Goal: Diagnostic test results will improve Outcome: Progressing Goal: Respiratory complications will improve Outcome: Progressing Goal: Cardiovascular complication will be avoided Outcome: Progressing   Problem: Pain Managment: Goal: General experience of comfort will improve and/or be controlled Outcome: Progressing   Problem: Safety: Goal: Ability to remain free from injury will improve Outcome: Progressing   Problem: Skin Integrity: Goal: Risk for impaired skin integrity will decrease Outcome: Progressing

## 2024-02-08 DIAGNOSIS — L03115 Cellulitis of right lower limb: Secondary | ICD-10-CM | POA: Diagnosis not present

## 2024-02-08 MED ORDER — DIPHENHYDRAMINE HCL 25 MG PO CAPS
25.0000 mg | ORAL_CAPSULE | Freq: Four times a day (QID) | ORAL | Status: DC | PRN
  Administered 2024-02-08 (×2): 25 mg via ORAL
  Filled 2024-02-08 (×2): qty 1

## 2024-02-08 NOTE — TOC Progression Note (Signed)
 Transition of Care [facility]) - Progression Note    Patient Details  Name: Christine Jacobson MRN: [id number] Date of Birth: Jul 12, 1955  [facility]) CM/SW Contact  [name], LCSW Phone Number: 02/08/2024, 8:36 AM  Clinical Narrative:     Received message from [place] with [facility], pt's insurance auth still pending. ICM to follow  Expected Discharge Plan: Skilled Nursing Facility Barriers to Discharge: Barriers Resolved               Expected Discharge Plan and Services       Living arrangements for the past 2 months: Apartment                                       Social Drivers of Health (SDOH) Interventions SDOH Screenings   Food Insecurity: No Food Insecurity (02/03/2024)  Housing: Low Risk (02/03/2024)  Transportation Needs: No Transportation Needs (02/03/2024)  Utilities: Not At Risk (02/03/2024)  Depression (PHQ2-9): Low Risk (10/26/2021)  Financial Resource Strain: Low Risk (12/03/2021)  Social Connections: Socially Isolated (02/03/2024)  Tobacco Use: Medium Risk (02/02/2024)    Readmission Risk Interventions    11/16/2023   10:02 AM 05/12/2023    4:15 PM 12/28/2022   11:28 AM  Readmission Risk Prevention Plan  Transportation Screening Complete Complete Complete  PCP or Specialist Appt within 3-5 Days Complete  Complete  HRI or Home Care Consult Complete  Complete  Social Work Consult for Recovery Care Planning/Counseling Complete  Complete  Palliative Care Screening Not Applicable  Not Applicable  Medication Review Oceanographer)  Complete Complete  HRI or Home Care Consult  Complete   Palliative Care Screening  Not Applicable   Skilled Nursing Facility  Not Applicable

## 2024-02-08 NOTE — Plan of Care (Signed)
  Problem: Education: Goal: Knowledge of General Education information will improve Description: Including pain rating scale, medication(s)/side effects and non-pharmacologic comfort measures Outcome: Progressing   Problem: Clinical Measurements: Goal: Ability to maintain clinical measurements within normal limits will improve Outcome: Progressing Goal: Will remain free from infection Outcome: Progressing Goal: Diagnostic test results will improve Outcome: Progressing Goal: Respiratory complications will improve Outcome: Progressing Goal: Cardiovascular complication will be avoided Outcome: Progressing   Problem: Pain Managment: Goal: General experience of comfort will improve and/or be controlled Outcome: Progressing   Problem: Safety: Goal: Ability to remain free from injury will improve Outcome: Progressing   Problem: Skin Integrity: Goal: Risk for impaired skin integrity will decrease Outcome: Progressing

## 2024-02-08 NOTE — Progress Notes (Signed)
 " PROGRESS NOTE    Christine Jacobson  FMW:[id number] DOB: December 27, 1955 DOA: 02/02/2024 PCP: [name], NP     Brief Narrative:  Christine Jacobson is a 69 year old with history of diastolic congestive heart failure, HOCM, hyperlipidemia, A-fib on Eliquis , hypertension and obesity, gout and recurrent cellulitis presented for worsening lower extremity swelling and erythema also reported mild shortness of breath, mostly leg pain and spasms and intermittent chills.  In the emergency room initially controlled heart rate, developed rapid A-fib overnight improved with metoprolol .  proBNP 3554.  WC count was 11.7.  Electrolytes are adequate.  Admitted with IV antibiotics, also given IV diuresis with fluid overload.   New events last 24 hours / Subjective: Feeling better this morning.  Denies any leg pain today.  Awaiting SNF placement.  Assessment & Plan:   Principal Problem:   Cellulitis of right lower extremity Active Problems:   Mixed hyperlipidemia   Essential hypertension   Acute on chronic diastolic heart failure (HCC)   Gastroesophageal reflux disease   Chronic gout without tophus   Right lower extremity cellulitis Chronic lymphedema - Appreciate wound care RN recommendation for local wound care - Now on doxycycline   Acute on chronic diastolic CHF History of HOCM - Lasix , Toprol   Persistent A-fib - Toprol , Eliquis   Hyperlipidemia - Lipitor   Gout - Allopurinol   GERD - Protonix   DVT prophylaxis:  apixaban  (ELIQUIS ) tablet 5 mg  Code Status: DNR Family Communication: None Disposition Plan: SNF pending placement Status is: Inpatient Remains inpatient appropriate because: Pending placement    Antimicrobials:  Anti-infectives (From admission, onward)    Start     Dose/Rate Route Frequency Ordered Stop   02/05/24 1145  doxycycline  (VIBRA -TABS) tablet 100 mg        100 mg Oral Every 12 hours 02/05/24 1058 02/10/24 2159   02/03/24 1400  vancomycin   (VANCOREADY) IVPB 1250 mg/250 mL  Status:  Discontinued        1,250 mg 166.7 mL/hr over 90 Minutes Intravenous Every 24 hours 02/02/24 1848 02/05/24 1058   02/02/24 1830  vancomycin  (VANCOREADY) IVPB 1250 mg/250 mL  Status:  Discontinued        1,250 mg 166.7 mL/hr over 90 Minutes Intravenous Every 24 hours 02/02/24 1755 02/02/24 1848   02/02/24 1315  vancomycin  (VANCOCIN ) IVPB 1000 mg/200 mL premix        1,000 mg 200 mL/hr over 60 Minutes Intravenous  Once 02/02/24 1314 02/02/24 1518        Objective: Vitals:   02/07/24 1346 02/07/24 2026 02/08/24 0500 02/08/24 0534  BP: 113/82 101/80  (!) 142/78  Pulse: 80 87  78  Resp: 18 17  16   Temp: 97.9 F (36.6 C) 98.4 F (36.9 C)  97.9 F (36.6 C)  TempSrc: Oral Oral  Oral  SpO2: 94% 98%  96%  Weight:   80.9 kg   Height:        Intake/Output Summary (Last 24 hours) at 02/08/2024 1026 Last data filed at 02/08/2024 1022 Gross per 24 hour  Intake 1220 ml  Output 300 ml  Net 920 ml   Filed Weights   02/05/24 0549 02/07/24 0500 02/08/24 0500  Weight: 80.6 kg 80 kg 80.9 kg   Examination: General exam: Appears calm and comfortable  Respiratory system: Clear to auscultation. Respiratory effort normal. Cardiovascular system: S1 & S2 heard, RRR. + Systolic murmur.   Gastrointestinal system: Abdomen is nondistended, soft Central nervous system: Alert and oriented. Non focal exam. Speech clear  Psychiatry: Judgement and insight appear stable. Mood & affect appropriate.     Data Reviewed: I have personally reviewed following labs and imaging studies  CBC: Recent Labs  Lab 02/02/24 1412 02/03/24 0442  WBC 11.7* 11.0*  NEUTROABS 10.1*  --   HGB 13.0 11.2*  HCT 42.6 36.3  MCV 86.1 85.4  PLT 356 303   Basic Metabolic Panel: Recent Labs  Lab 02/02/24 1412 02/03/24 0442  NA 138 138  K 4.6 3.7  CL 107 107  CO2 20* 22  GLUCOSE 91 99  BUN 17 17  CREATININE 0.96 1.03*  CALCIUM  10.1 9.5   GFR: Estimated Creatinine  Clearance: 52.3 mL/min (A) (by C-G formula based on SCr of 1.03 mg/dL (H)). Liver Function Tests: Recent Labs  Lab 02/02/24 1412  AST 68*  ALT 33  ALKPHOS 316*  BILITOT 0.4  PROT 7.5  ALBUMIN  3.5   No results for input(s): LIPASE, AMYLASE in the last 168 hours. No results for input(s): AMMONIA in the last 168 hours. Coagulation Profile: No results for input(s): INR, PROTIME in the last 168 hours. Cardiac Enzymes: No results for input(s): CKTOTAL, CKMB, CKMBINDEX, TROPONINI in the last 168 hours. BNP (last 3 results) Recent Labs    10/12/23 0458 01/04/24 0850 02/02/24 1412  PROBNP 4,823.0* 2,651.0* 3,554.0*   HbA1C: No results for input(s): HGBA1C in the last 72 hours. CBG: Recent Labs  Lab 02/03/24 2002  GLUCAP 148*   Lipid Profile: No results for input(s): CHOL, HDL, LDLCALC, TRIG, CHOLHDL, LDLDIRECT in the last 72 hours. Thyroid Function Tests: No results for input(s): TSH, T4TOTAL, FREET4, T3FREE, THYROIDAB in the last 72 hours. Anemia Panel: No results for input(s): VITAMINB12, FOLATE, FERRITIN, TIBC, IRON , RETICCTPCT in the last 72 hours. Sepsis Labs: Recent Labs  Lab 02/02/24 1420 02/02/24 1854  LATICACIDVEN 2.0* 1.5    No results found for this or any previous visit (from the past 240 hours).    Radiology Studies: No results found.    Scheduled Meds:  allopurinol   100 mg Oral q morning   apixaban   5 mg Oral BID   atorvastatin   80 mg Oral Daily   doxycycline   100 mg Oral Q12H   furosemide   40 mg Oral Daily   gabapentin   100 mg Oral TID   metoprolol  succinate  50 mg Oral Daily   pantoprazole   40 mg Oral Daily   potassium chloride   20 mEq Oral QODAY   Continuous Infusions:   LOS: 6 days   Time spent: 25 minutes   Delon Hoe, DO [facility] 02/08/2024, 10:26 AM   Available via Epic secure chat 7am-7pm After these hours, please refer to coverage provider listed on  [email or web address]  "

## 2024-02-08 NOTE — Progress Notes (Signed)
 Physical Therapy Treatment Patient Details Name: Christine Jacobson MRN: [id number] DOB: 1955-06-12 Today's Date: 02/08/2024   History of Present Illness 69 year old female admitted on 02/02/24 for Cellulitis and erythema of the right lower extremity, chronic lymphedema. PMHx: diastolic congestive heart failure, HOCM, hyperlipidemia, A-fib on Eliquis , hypertension, obesity, gout and recurrent cellulitis    PT Comments  PT - Cognition Comments: AxO x 3  following all instructions.  Max encouragement to participate. Assisted OOB to amb with max encouragement.  General Gait Details: VERY unsteady gait with heavy leanon walker.  Prior, Pt amb with a walker sometimes, stated Pt.  Limited activity tolerance.  Deconditioned.  HIGH FALL RISK. Assisted to recliner and positioned to comfort. Prior Pt lived home alone and was IND amb with no AD.  LPT has rec Pt will need ST Rehab at SNF to address mobility and functional decline prior to safely returning home.    If plan is discharge home, recommend the following: A little help with walking and/or transfers;A little help with bathing/dressing/bathroom;Help with stairs or ramp for entrance   Can travel by private vehicle        Equipment Recommendations       Recommendations for Other Services       Precautions / Restrictions Precautions Precautions: Fall Restrictions Weight Bearing Restrictions Per Provider Order: No     Mobility  Bed Mobility Overal bed mobility: Needs Assistance Bed Mobility: Supine to Sit, Sit to Supine           General bed mobility comments: increased time and effort esp with scooting to EOB    Transfers Overall transfer level: Needs assistance Equipment used: Rolling walker (2 wheels) Transfers: Sit to/from Stand Sit to Stand: Contact guard assist, From elevated surface           General transfer comment: cues for hand placement, assist to rise and control descent     Ambulation/Gait Ambulation/Gait assistance: Min assist Gait Distance (Feet): 47 Feet Assistive device: Rolling walker (2 wheels) Gait Pattern/deviations: Step-to pattern, Staggering left, Staggering right, Trunk flexed Gait velocity: decreased     General Gait Details: VERY unsteady gait with heavy leanon walker.  Prior, Pt amb with a walker sometimes, stated Pt.  Limited activity tolerance.  Deconditioned.  HIGH FALL RISK.   Stairs             Wheelchair Mobility     Tilt Bed    Modified Rankin (Stroke Patients Only)       Balance                                            Communication Communication Communication: No apparent difficulties  Cognition Arousal: Alert Behavior During Therapy: WFL for tasks assessed/performed   PT - Cognitive impairments: No apparent impairments                       PT - Cognition Comments: AxO x 3  following all instructions.  Max encouragement to participate. Following commands: Intact      Cueing Cueing Techniques: Verbal cues  Exercises      General Comments        Pertinent Vitals/Pain Pain Assessment Pain Assessment: No/denies pain Pain Location: RLE Pain Descriptors / Indicators: Sore, Tender, Aching Pain Intervention(s): Monitored during session, Repositioned    Home Living  Prior Function            PT Goals (current goals can now be found in the care plan section) Progress towards PT goals: Progressing toward goals    Frequency    Min 3X/week      PT Plan      Co-evaluation              AM-PAC PT 6 Clicks Mobility   Outcome Measure  Help needed turning from your back to your side while in a flat bed without using bedrails?: A Little Help needed moving from lying on your back to sitting on the side of a flat bed without using bedrails?: A Little Help needed moving to and from a bed to a chair (including a  wheelchair)?: A Little Help needed standing up from a chair using your arms (e.g., wheelchair or bedside chair)?: A Lot Help needed to walk in hospital room?: A Lot Help needed climbing 3-5 steps with a railing? : A Lot 6 Click Score: 15    End of Session Equipment Utilized During Treatment: Gait belt Activity Tolerance: Patient limited by fatigue Patient left: in chair;with call bell/phone within reach;with chair alarm set Nurse Communication: Mobility status PT Visit Diagnosis: Difficulty in walking, not elsewhere classified (R26.2);Muscle weakness (generalized) (M62.81)     Time: 8950-8890 PT Time Calculation (min) (ACUTE ONLY): 20 min  Charges:    $Gait Training: 8-22 mins PT General Charges $$ ACUTE PT VISIT: 1 Visit                     Katheryn Leap  PTA Acute  Rehabilitation Services Office M-F          [phone]

## 2024-02-09 ENCOUNTER — Inpatient Hospital Stay: Admitting: Gynecologic Oncology

## 2024-02-09 DIAGNOSIS — L03115 Cellulitis of right lower limb: Secondary | ICD-10-CM | POA: Diagnosis not present

## 2024-02-09 MED ORDER — HYDROXYZINE HCL 10 MG PO TABS
10.0000 mg | ORAL_TABLET | Freq: Once | ORAL | Status: AC
  Administered 2024-02-09: 10 mg via ORAL
  Filled 2024-02-09: qty 1

## 2024-02-09 NOTE — TOC Progression Note (Signed)
 Transition of Care [facility]) - Progression Note    Patient Details  Name: Christine Jacobson MRN: [id number] Date of Birth: 10-03-1955  Transition of Care [facility] CM/SW Contact  [name], LCSW Phone Number: 02/09/2024, 12:55 PM  Clinical Narrative:     Pt's insurance shara is pending. ICM to follow.   Expected Discharge Plan: Skilled Nursing Facility Barriers to Discharge: Barriers Resolved               Expected Discharge Plan and Services       Living arrangements for the past 2 months: Apartment                                       Social Drivers of Health (SDOH) Interventions SDOH Screenings   Food Insecurity: No Food Insecurity (02/03/2024)  Housing: Low Risk (02/03/2024)  Transportation Needs: No Transportation Needs (02/03/2024)  Utilities: Not At Risk (02/03/2024)  Depression (PHQ2-9): Low Risk (10/26/2021)  Financial Resource Strain: Low Risk (12/03/2021)  Social Connections: Socially Isolated (02/03/2024)  Tobacco Use: Medium Risk (02/02/2024)    Readmission Risk Interventions    11/16/2023   10:02 AM 05/12/2023    4:15 PM 12/28/2022   11:28 AM  Readmission Risk Prevention Plan  Transportation Screening Complete Complete Complete  PCP or Specialist Appt within 3-5 Days Complete  Complete  HRI or Home Care Consult Complete  Complete  Social Work Consult for Recovery Care Planning/Counseling Complete  Complete  Palliative Care Screening Not Applicable  Not Applicable  Medication Review [employer])  Complete Complete  HRI or Home Care Consult  Complete   Palliative Care Screening  Not Applicable   Skilled Nursing Facility  Not Applicable

## 2024-02-09 NOTE — Progress Notes (Signed)
 PT Cancellation Note  Patient Details Name: Christine Jacobson MRN: [id number] DOB: 12/20/1955   Cancelled Treatment:    Reason Eval/Treat Not Completed: Patient declined, n attempted x 2 , patient both times had been up  and ambulated in room. PT will follow. Darice Potters PT Acute Rehabilitation Services Office [phone]    Potters Darice Norris 02/09/2024, 4:19 PM

## 2024-02-09 NOTE — Progress Notes (Signed)
 " PROGRESS NOTE    Christine Jacobson  FMW:[id number] DOB: July 18, 1955 DOA: 02/02/2024 PCP: [name], NP     Brief Narrative:  Reygan Heagle is a 69 year old with history of diastolic congestive heart failure, HOCM, hyperlipidemia, A-fib on Eliquis , hypertension and obesity, gout and recurrent cellulitis presented for worsening lower extremity swelling and erythema also reported mild shortness of breath, mostly leg pain and spasms and intermittent chills.  In the emergency room initially controlled heart rate, developed rapid A-fib overnight improved with metoprolol .  proBNP 3554.  WC count was 11.7.  Electrolytes are adequate.  Admitted with IV antibiotics, also given IV diuresis with fluid overload.   New events last 24 hours / Subjective: No new complaints today.  Awaiting SNF placement  Assessment & Plan:   Principal Problem:   Cellulitis of right lower extremity Active Problems:   Mixed hyperlipidemia   Essential hypertension   Acute on chronic diastolic heart failure (HCC)   Gastroesophageal reflux disease   Chronic gout without tophus   Right lower extremity cellulitis Chronic lymphedema - Appreciate wound care RN recommendation for local wound care - Now on doxycycline   Acute on chronic diastolic CHF History of HOCM - Lasix , Toprol   Persistent A-fib - Toprol , Eliquis   Hyperlipidemia - Lipitor   Gout - Allopurinol   GERD - Protonix   DVT prophylaxis:  apixaban  (ELIQUIS ) tablet 5 mg  Code Status: DNR Family Communication: None Disposition Plan: SNF pending placement Status is: Inpatient Remains inpatient appropriate because: Pending placement    Antimicrobials:  Anti-infectives (From admission, onward)    Start     Dose/Rate Route Frequency Ordered Stop   02/05/24 1145  doxycycline  (VIBRA -TABS) tablet 100 mg        100 mg Oral Every 12 hours 02/05/24 1058 02/10/24 2159   02/03/24 1400  vancomycin  (VANCOREADY) IVPB 1250 mg/250 mL  Status:   Discontinued        1,250 mg 166.7 mL/hr over 90 Minutes Intravenous Every 24 hours 02/02/24 1848 02/05/24 1058   02/02/24 1830  vancomycin  (VANCOREADY) IVPB 1250 mg/250 mL  Status:  Discontinued        1,250 mg 166.7 mL/hr over 90 Minutes Intravenous Every 24 hours 02/02/24 1755 02/02/24 1848   02/02/24 1315  vancomycin  (VANCOCIN ) IVPB 1000 mg/200 mL premix        1,000 mg 200 mL/hr over 60 Minutes Intravenous  Once 02/02/24 1314 02/02/24 1518        Objective: Vitals:   02/08/24 0534 02/08/24 1403 02/08/24 2024 02/09/24 0521  BP: (!) 142/78 (!) 119/96 106/75 104/84  Pulse: 78 84 88 80  Resp: 16 18 15 18   Temp: 97.9 F (36.6 C) 98.1 F (36.7 C) 98.4 F (36.9 C) 98.2 F (36.8 C)  TempSrc: Oral Oral Oral Oral  SpO2: 96% 97% 95% 97%  Weight:    79.8 kg  Height:        Intake/Output Summary (Last 24 hours) at 02/09/2024 1233 Last data filed at 02/09/2024 0900 Gross per 24 hour  Intake 1080 ml  Output 1150 ml  Net -70 ml   Filed Weights   02/07/24 0500 02/08/24 0500 02/09/24 0521  Weight: 80 kg 80.9 kg 79.8 kg   Examination: General exam: Appears calm and comfortable  Respiratory system: Clear to auscultation. Respiratory effort normal. Cardiovascular system: S1 & S2 heard, RRR. + Systolic murmur.   Gastrointestinal system: Abdomen is nondistended, soft Central nervous system: Alert and oriented. Non focal exam. Speech clear  Psychiatry: Judgement  and insight appear stable. Mood & affect appropriate.     Data Reviewed: I have personally reviewed following labs and imaging studies  CBC: Recent Labs  Lab 02/02/24 1412 02/03/24 0442  WBC 11.7* 11.0*  NEUTROABS 10.1*  --   HGB 13.0 11.2*  HCT 42.6 36.3  MCV 86.1 85.4  PLT 356 303   Basic Metabolic Panel: Recent Labs  Lab 02/02/24 1412 02/03/24 0442  NA 138 138  K 4.6 3.7  CL 107 107  CO2 20* 22  GLUCOSE 91 99  BUN 17 17  CREATININE 0.96 1.03*  CALCIUM  10.1 9.5   GFR: Estimated Creatinine  Clearance: 51.9 mL/min (A) (by C-G formula based on SCr of 1.03 mg/dL (H)). Liver Function Tests: Recent Labs  Lab 02/02/24 1412  AST 68*  ALT 33  ALKPHOS 316*  BILITOT 0.4  PROT 7.5  ALBUMIN  3.5   No results for input(s): LIPASE, AMYLASE in the last 168 hours. No results for input(s): AMMONIA in the last 168 hours. Coagulation Profile: No results for input(s): INR, PROTIME in the last 168 hours. Cardiac Enzymes: No results for input(s): CKTOTAL, CKMB, CKMBINDEX, TROPONINI in the last 168 hours. BNP (last 3 results) Recent Labs    10/12/23 0458 01/04/24 0850 02/02/24 1412  PROBNP 4,823.0* 2,651.0* 3,554.0*   HbA1C: No results for input(s): HGBA1C in the last 72 hours. CBG: Recent Labs  Lab 02/03/24 2002  GLUCAP 148*   Lipid Profile: No results for input(s): CHOL, HDL, LDLCALC, TRIG, CHOLHDL, LDLDIRECT in the last 72 hours. Thyroid Function Tests: No results for input(s): TSH, T4TOTAL, FREET4, T3FREE, THYROIDAB in the last 72 hours. Anemia Panel: No results for input(s): VITAMINB12, FOLATE, FERRITIN, TIBC, IRON , RETICCTPCT in the last 72 hours. Sepsis Labs: Recent Labs  Lab 02/02/24 1420 02/02/24 1854  LATICACIDVEN 2.0* 1.5    No results found for this or any previous visit (from the past 240 hours).    Radiology Studies: No results found.    Scheduled Meds:  allopurinol   100 mg Oral q morning   apixaban   5 mg Oral BID   atorvastatin   80 mg Oral Daily   doxycycline   100 mg Oral Q12H   furosemide   40 mg Oral Daily   gabapentin   100 mg Oral TID   metoprolol  succinate  50 mg Oral Daily   pantoprazole   40 mg Oral Daily   potassium chloride   20 mEq Oral QODAY   Continuous Infusions:   LOS: 7 days   Time spent: 20 minutes   Delon Hoe, DO [facility] 02/09/2024, 12:33 PM   Available via Epic secure chat 7am-7pm After these hours, please refer to coverage provider listed on  [email or web address]  "

## 2024-02-09 NOTE — Progress Notes (Signed)
 Wound care completed to right lower leg per instructions. Kerlix and ace wrap applied to left lower leg based on patient's verbal concerns. Wound care pics obtained per MD request and placed into EMR for MD's review. PRN Tylenol  given to patient due to increased lower leg pain after wound care was completed.

## 2024-02-09 NOTE — Plan of Care (Signed)
  Problem: Clinical Measurements: Goal: Cardiovascular complication will be avoided Outcome: Progressing   Problem: Pain Managment: Goal: General experience of comfort will improve and/or be controlled Outcome: Progressing   Problem: Safety: Goal: Ability to remain free from injury will improve Outcome: Progressing

## 2024-02-10 DIAGNOSIS — L03115 Cellulitis of right lower limb: Secondary | ICD-10-CM | POA: Diagnosis not present

## 2024-02-10 MED ORDER — FUROSEMIDE 40 MG PO TABS
40.0000 mg | ORAL_TABLET | Freq: Every day | ORAL | Status: DC
  Administered 2024-02-11 – 2024-02-15 (×5): 40 mg via ORAL
  Filled 2024-02-10 (×6): qty 1

## 2024-02-10 MED ORDER — HYDROXYZINE HCL 25 MG PO TABS
25.0000 mg | ORAL_TABLET | Freq: Three times a day (TID) | ORAL | Status: DC | PRN
  Administered 2024-02-10 – 2024-02-12 (×4): 25 mg via ORAL
  Filled 2024-02-10 (×4): qty 1

## 2024-02-10 NOTE — Progress Notes (Signed)
 " PROGRESS NOTE    Christine Jacobson  FMW:[id number] DOB: 02-20-1955 DOA: 02/02/2024 PCP: [name], NP     Brief Narrative:  Christine Jacobson is a 69 year old with history of diastolic congestive heart failure, HOCM, hyperlipidemia, A-fib on Eliquis , hypertension and obesity, gout and recurrent cellulitis presented for worsening lower extremity swelling and erythema also reported mild shortness of breath, mostly leg pain and spasms and intermittent chills.  In the emergency room initially controlled heart rate, developed rapid A-fib overnight improved with metoprolol .  proBNP 3554.  WC count was 11.7.  Electrolytes are adequate.  Admitted with IV antibiotics, also given IV diuresis with fluid overload.   New events last 24 hours / Subjective: No new complaints today.  Awaiting SNF placement  Assessment & Plan:   Principal Problem:   Cellulitis of right lower extremity Active Problems:   Mixed hyperlipidemia   Essential hypertension   Acute on chronic diastolic heart failure (HCC)   Gastroesophageal reflux disease   Chronic gout without tophus   Right lower extremity cellulitis Chronic lymphedema - Appreciate wound care RN recommendation for local wound care - Now on doxycycline   Acute on chronic diastolic CHF History of HOCM - Lasix , Toprol   Persistent A-fib - Toprol , Eliquis   Hyperlipidemia - Lipitor   Gout - Allopurinol   GERD - Protonix   DVT prophylaxis:  apixaban  (ELIQUIS ) tablet 5 mg  Code Status: DNR Family Communication: None Disposition Plan: SNF pending placement Status is: Inpatient Remains inpatient appropriate because: Pending placement    Antimicrobials:  Anti-infectives (From admission, onward)    Start     Dose/Rate Route Frequency Ordered Stop   02/05/24 1145  doxycycline  (VIBRA -TABS) tablet 100 mg        100 mg Oral Every 12 hours 02/05/24 1058 02/10/24 2159   02/03/24 1400  vancomycin  (VANCOREADY) IVPB 1250 mg/250 mL  Status:   Discontinued        1,250 mg 166.7 mL/hr over 90 Minutes Intravenous Every 24 hours 02/02/24 1848 02/05/24 1058   02/02/24 1830  vancomycin  (VANCOREADY) IVPB 1250 mg/250 mL  Status:  Discontinued        1,250 mg 166.7 mL/hr over 90 Minutes Intravenous Every 24 hours 02/02/24 1755 02/02/24 1848   02/02/24 1315  vancomycin  (VANCOCIN ) IVPB 1000 mg/200 mL premix        1,000 mg 200 mL/hr over 60 Minutes Intravenous  Once 02/02/24 1314 02/02/24 1518        Objective: Vitals:   02/09/24 1936 02/09/24 1959 02/10/24 0415 02/10/24 0416  BP: (!) 84/58 110/70 (!) 85/62 107/77  Pulse: 91 82 79 82  Resp: 16  17   Temp: 98.6 F (37 C)  98.2 F (36.8 C)   TempSrc: Oral  Oral   SpO2: 98% 98% 97% 94%  Weight:    78.2 kg  Height:        Intake/Output Summary (Last 24 hours) at 02/10/2024 1046 Last data filed at 02/10/2024 0900 Gross per 24 hour  Intake 480 ml  Output --  Net 480 ml   Filed Weights   02/08/24 0500 02/09/24 0521 02/10/24 0416  Weight: 80.9 kg 79.8 kg 78.2 kg   Examination: General exam: Appears calm and comfortable  Respiratory system: Clear to auscultation. Respiratory effort normal. Cardiovascular system: S1 & S2 heard, RRR. + Systolic murmur.   Gastrointestinal system: Abdomen is nondistended, soft Central nervous system: Alert and oriented. Non focal exam. Speech clear  Psychiatry: Judgement and insight appear stable. Mood & affect  appropriate.     Data Reviewed: I have personally reviewed following labs and imaging studies  CBC: No results for input(s): WBC, NEUTROABS, HGB, HCT, MCV, PLT in the last 168 hours.  Basic Metabolic Panel: No results for input(s): NA, K, CL, CO2, GLUCOSE, BUN, CREATININE, CALCIUM , MG, PHOS in the last 168 hours.  GFR: Estimated Creatinine Clearance: 51.4 mL/min (A) (by C-G formula based on SCr of 1.03 mg/dL (H)). Liver Function Tests: No results for input(s): AST, ALT, ALKPHOS, BILITOT,  PROT, ALBUMIN  in the last 168 hours.  No results for input(s): LIPASE, AMYLASE in the last 168 hours. No results for input(s): AMMONIA in the last 168 hours. Coagulation Profile: No results for input(s): INR, PROTIME in the last 168 hours. Cardiac Enzymes: No results for input(s): CKTOTAL, CKMB, CKMBINDEX, TROPONINI in the last 168 hours. BNP (last 3 results) Recent Labs    10/12/23 0458 01/04/24 0850 02/02/24 1412  PROBNP 4,823.0* 2,651.0* 3,554.0*   HbA1C: No results for input(s): HGBA1C in the last 72 hours. CBG: Recent Labs  Lab 02/03/24 2002  GLUCAP 148*   Lipid Profile: No results for input(s): CHOL, HDL, LDLCALC, TRIG, CHOLHDL, LDLDIRECT in the last 72 hours. Thyroid Function Tests: No results for input(s): TSH, T4TOTAL, FREET4, T3FREE, THYROIDAB in the last 72 hours. Anemia Panel: No results for input(s): VITAMINB12, FOLATE, FERRITIN, TIBC, IRON , RETICCTPCT in the last 72 hours. Sepsis Labs: No results for input(s): PROCALCITON, LATICACIDVEN in the last 168 hours.   No results found for this or any previous visit (from the past 240 hours).    Radiology Studies: No results found.    Scheduled Meds:  allopurinol   100 mg Oral q morning   apixaban   5 mg Oral BID   atorvastatin   80 mg Oral Daily   doxycycline   100 mg Oral Q12H   [START ON 02/11/2024] furosemide   40 mg Oral Daily   gabapentin   100 mg Oral TID   metoprolol  succinate  50 mg Oral Daily   pantoprazole   40 mg Oral Daily   potassium chloride   20 mEq Oral QODAY   Continuous Infusions:   LOS: 8 days   Time spent: 20 minutes   Delon Hoe, DO [facility] 02/10/2024, 10:46 AM   Available via Epic secure chat 7am-7pm After these hours, please refer to coverage provider listed on [email or web address]  "

## 2024-02-10 NOTE — Plan of Care (Signed)
" °  Problem: Pain Managment: Goal: General experience of comfort will improve and/or be controlled Outcome: Progressing   Problem: Clinical Measurements: Goal: Cardiovascular complication will be avoided Outcome: Progressing   Problem: Clinical Measurements: Goal: Respiratory complications will improve Outcome: Progressing   Problem: Clinical Measurements: Goal: Diagnostic test results will improve Outcome: Progressing   Problem: Skin Integrity: Goal: Risk for impaired skin integrity will decrease Outcome: Progressing   "

## 2024-02-10 NOTE — Progress Notes (Signed)
 Wound care completed to right lower leg as instructed. PRN IV Morphine  1 mg given per patient request.

## 2024-02-10 NOTE — Plan of Care (Signed)
  Problem: Clinical Measurements: Goal: Will remain free from infection Outcome: Progressing Goal: Respiratory complications will improve Outcome: Progressing Goal: Cardiovascular complication will be avoided Outcome: Progressing   Problem: Pain Managment: Goal: General experience of comfort will improve and/or be controlled Outcome: Progressing   Problem: Safety: Goal: Ability to remain free from injury will improve Outcome: Progressing   Problem: Skin Integrity: Goal: Risk for impaired skin integrity will decrease Outcome: Progressing

## 2024-02-11 DIAGNOSIS — L03115 Cellulitis of right lower limb: Secondary | ICD-10-CM | POA: Diagnosis not present

## 2024-02-11 NOTE — TOC Progression Note (Addendum)
 Transition of Care [facility]) - Progression Note    Patient Details  Name: Christine Jacobson MRN: [id number] Date of Birth: 22-Jul-1955  [facility]) CM/SW Contact  Sonda Manuella Quill, RN Phone Number: 02/11/2024, 8:21 AM  Clinical Narrative:    LVM for Shona at [facility] to check status of ins auth; awaiting return call.  [phone]- return call from [facility]; she said ins shara has been denied.  Expected Discharge Plan: Skilled Nursing Facility Barriers to Discharge: Barriers Resolved               Expected Discharge Plan and Services       Living arrangements for the past 2 months: Apartment                                       Social Drivers of Health (SDOH) Interventions SDOH Screenings   Food Insecurity: No Food Insecurity (02/03/2024)  Housing: Low Risk (02/03/2024)  Transportation Needs: No Transportation Needs (02/03/2024)  Utilities: Not At Risk (02/03/2024)  Depression (PHQ2-9): Low Risk (10/26/2021)  Financial Resource Strain: Low Risk (12/03/2021)  Social Connections: Socially Isolated (02/03/2024)  Tobacco Use: Medium Risk (02/02/2024)    Readmission Risk Interventions    11/16/2023   10:02 AM 05/12/2023    4:15 PM 12/28/2022   11:28 AM  Readmission Risk Prevention Plan  Transportation Screening Complete Complete Complete  PCP or Specialist Appt within 3-5 Days Complete  Complete  HRI or Home Care Consult Complete  Complete  Social Work Consult for Recovery Care Planning/Counseling Complete  Complete  Palliative Care Screening Not Applicable  Not Applicable  Medication Review Oceanographer)  Complete Complete  HRI or Home Care Consult  Complete   Palliative Care Screening  Not Applicable   Skilled Nursing Facility  Not Applicable

## 2024-02-11 NOTE — Progress Notes (Signed)
 " PROGRESS NOTE    Christine Jacobson  FMW:[id number] DOB: [birth year] DOA: 02/02/2024 PCP: [name], NP     Brief Narrative:  Christine Jacobson is a 69 year old with history of diastolic congestive heart failure, HOCM, hyperlipidemia, A-fib on Eliquis , hypertension and obesity, gout and recurrent cellulitis presented for worsening lower extremity swelling and erythema also reported mild shortness of breath, mostly leg pain and spasms and intermittent chills.  In the emergency room initially controlled heart rate, developed rapid A-fib overnight improved with metoprolol .  proBNP 3554.  WC count was 11.7.  Electrolytes are adequate.  Admitted with IV antibiotics, also given IV diuresis with fluid overload.   New events last 24 hours / Subjective: No new complaints today.  Awaiting SNF placement  Assessment & Plan:   Principal Problem:   Cellulitis of right lower extremity Active Problems:   Mixed hyperlipidemia   Essential hypertension   Acute on chronic diastolic heart failure (HCC)   Gastroesophageal reflux disease   Chronic gout without tophus   Right lower extremity cellulitis Chronic lymphedema - Appreciate wound care RN recommendation for local wound care - Now on doxycycline   Acute on chronic diastolic CHF History of HOCM - Lasix , Toprol   Persistent A-fib - Toprol , Eliquis   Hyperlipidemia - Lipitor   Gout - Allopurinol   GERD - Protonix   DVT prophylaxis:  apixaban  (ELIQUIS ) tablet 5 mg  Code Status: DNR Family Communication: None Disposition Plan: SNF pending placement Status is: Inpatient Remains inpatient appropriate because: Pending placement    Antimicrobials:  Anti-infectives (From admission, onward)    Start     Dose/Rate Route Frequency Ordered Stop   02/05/24 1145  doxycycline  (VIBRA -TABS) tablet 100 mg        100 mg Oral Every 12 hours 02/05/24 1058 02/10/24 1046   02/03/24 1400  vancomycin  (VANCOREADY) IVPB 1250 mg/250 mL  Status:   Discontinued        1,250 mg 166.7 mL/hr over 90 Minutes Intravenous Every 24 hours 02/02/24 1848 02/05/24 1058   02/02/24 1830  vancomycin  (VANCOREADY) IVPB 1250 mg/250 mL  Status:  Discontinued        1,250 mg 166.7 mL/hr over 90 Minutes Intravenous Every 24 hours 02/02/24 1755 02/02/24 1848   02/02/24 1315  vancomycin  (VANCOCIN ) IVPB 1000 mg/200 mL premix        1,000 mg 200 mL/hr over 60 Minutes Intravenous  Once 02/02/24 1314 02/02/24 1518        Objective: Vitals:   02/10/24 1942 02/11/24 0409 02/11/24 0500 02/11/24 0948  BP: 110/64 121/84  127/83  Pulse: 92 72  71  Resp: 17 15    Temp: 98.4 F (36.9 C) 98.5 F (36.9 C)    TempSrc:      SpO2: 97% 97%    Weight:   78 kg   Height:        Intake/Output Summary (Last 24 hours) at 02/11/2024 0955 Last data filed at 02/10/2024 1750 Gross per 24 hour  Intake 440 ml  Output --  Net 440 ml   Filed Weights   02/09/24 0521 02/10/24 0416 02/11/24 0500  Weight: 79.8 kg 78.2 kg 78 kg   Examination: General exam: Appears calm and comfortable  Psychiatry: Judgement and insight appear stable. Mood & affect appropriate.     Data Reviewed: I have personally reviewed following labs and imaging studies  CBC: No results for input(s): WBC, NEUTROABS, HGB, HCT, MCV, PLT in the last 168 hours.  Basic Metabolic Panel: No results for  input(s): NA, K, CL, CO2, GLUCOSE, BUN, CREATININE, CALCIUM , MG, PHOS in the last 168 hours.  GFR: Estimated Creatinine Clearance: 51.3 mL/min (A) (by C-G formula based on SCr of 1.03 mg/dL (H)). Liver Function Tests: No results for input(s): AST, ALT, ALKPHOS, BILITOT, PROT, ALBUMIN  in the last 168 hours.  No results for input(s): LIPASE, AMYLASE in the last 168 hours. No results for input(s): AMMONIA in the last 168 hours. Coagulation Profile: No results for input(s): INR, PROTIME in the last 168 hours. Cardiac Enzymes: No results for  input(s): CKTOTAL, CKMB, CKMBINDEX, TROPONINI in the last 168 hours. BNP (last 3 results) Recent Labs    10/12/23 0458 01/04/24 0850 02/02/24 1412  PROBNP 4,823.0* 2,651.0* 3,554.0*   HbA1C: No results for input(s): HGBA1C in the last 72 hours. CBG: No results for input(s): GLUCAP in the last 168 hours.  Lipid Profile: No results for input(s): CHOL, HDL, LDLCALC, TRIG, CHOLHDL, LDLDIRECT in the last 72 hours. Thyroid Function Tests: No results for input(s): TSH, T4TOTAL, FREET4, T3FREE, THYROIDAB in the last 72 hours. Anemia Panel: No results for input(s): VITAMINB12, FOLATE, FERRITIN, TIBC, IRON , RETICCTPCT in the last 72 hours. Sepsis Labs: No results for input(s): PROCALCITON, LATICACIDVEN in the last 168 hours.   No results found for this or any previous visit (from the past 240 hours).    Radiology Studies: No results found.    Scheduled Meds:  allopurinol   100 mg Oral q morning   apixaban   5 mg Oral BID   atorvastatin   80 mg Oral Daily   furosemide   40 mg Oral Daily   gabapentin   100 mg Oral TID   metoprolol  succinate  50 mg Oral Daily   pantoprazole   40 mg Oral Daily   potassium chloride   20 mEq Oral QODAY   Continuous Infusions:   LOS: 9 days   Time spent: 15 minutes   Delon Hoe, DO [facility] 02/11/2024, 9:55 AM   Available via Epic secure chat 7am-7pm After these hours, please refer to coverage provider listed on [email or web address]  "

## 2024-02-12 DIAGNOSIS — L03115 Cellulitis of right lower limb: Secondary | ICD-10-CM | POA: Diagnosis not present

## 2024-02-12 MED ORDER — OXYCODONE HCL 5 MG PO TABS
5.0000 mg | ORAL_TABLET | ORAL | Status: DC | PRN
  Administered 2024-02-14: 5 mg via ORAL
  Filled 2024-02-12: qty 1

## 2024-02-12 NOTE — Progress Notes (Signed)
 " PROGRESS NOTE    Christine Jacobson  FMW:[id number] DOB: [birth year] DOA: 02/02/2024 PCP: [name], NP     Brief Narrative:  Christine Jacobson is a 69 year old with history of diastolic congestive heart failure, HOCM, hyperlipidemia, A-fib on Eliquis , hypertension and obesity, gout and recurrent cellulitis presented for worsening lower extremity swelling and erythema also reported mild shortness of breath, mostly leg pain and spasms and intermittent chills.  In the emergency room initially controlled heart rate, developed rapid A-fib overnight improved with metoprolol .  proBNP 3554.  WC count was 11.7.  Electrolytes are adequate.  Admitted with IV antibiotics, also given IV diuresis with fluid overload.   New events last 24 hours / Subjective: Pt reports doing well.  No complaints, waiting on placement.  Told later in the day that she was denied by insurance for SNF.  She plans to appeal this.  Assessment & Plan:   Principal Problem:   Cellulitis of right lower extremity Active Problems:   Mixed hyperlipidemia   Essential hypertension   Acute on chronic diastolic heart failure (HCC)   Gastroesophageal reflux disease   Chronic gout without tophus   Right lower extremity cellulitis Chronic lymphedema - Appreciate wound care RN recommendation for local wound care - Now on doxycycline  - Changed to oral oxycodone  for pain control from morphine    Acute on chronic diastolic CHF History of HOCM - Lasix , Toprol    Persistent A-fib - Toprol , Eliquis  5 mg twice daily   Hyperlipidemia - Lipitor  80 mg daily   Gout - Allopurinol  100 mg daily   GERD - Protonix  40 mg daily   DVT prophylaxis:  apixaban  (ELIQUIS ) tablet 5 mg  Code Status: DNR Family Communication: None Disposition Plan: SNF pending placement appeal Status is: Inpatient Remains inpatient appropriate because: Pending placement/appeal Appreciate TOC team    Antimicrobials:  Anti-infectives (From  admission, onward)    Start     Dose/Rate Route Frequency Ordered Stop   02/05/24 1145  doxycycline  (VIBRA -TABS) tablet 100 mg        100 mg Oral Every 12 hours 02/05/24 1058 02/10/24 1046   02/03/24 1400  vancomycin  (VANCOREADY) IVPB 1250 mg/250 mL  Status:  Discontinued        1,250 mg 166.7 mL/hr over 90 Minutes Intravenous Every 24 hours 02/02/24 1848 02/05/24 1058   02/02/24 1830  vancomycin  (VANCOREADY) IVPB 1250 mg/250 mL  Status:  Discontinued        1,250 mg 166.7 mL/hr over 90 Minutes Intravenous Every 24 hours 02/02/24 1755 02/02/24 1848   02/02/24 1315  vancomycin  (VANCOCIN ) IVPB 1000 mg/200 mL premix        1,000 mg 200 mL/hr over 60 Minutes Intravenous  Once 02/02/24 1314 02/02/24 1518        Objective: Vitals:   02/11/24 2110 02/12/24 0500 02/12/24 0520 02/12/24 1330  BP: 101/69  114/75 96/72  Pulse: 81  74 90  Resp:    18  Temp: 97.9 F (36.6 C)  97.9 F (36.6 C) 98 F (36.7 C)  TempSrc: Oral  Oral Oral  SpO2: 96%  94% 95%  Weight:  80.6 kg    Height:        Intake/Output Summary (Last 24 hours) at 02/12/2024 1436 Last data filed at 02/12/2024 1024 Gross per 24 hour  Intake 560 ml  Output 200 ml  Net 360 ml   Filed Weights   02/10/24 0416 02/11/24 0500 02/12/24 0500  Weight: 78.2 kg 78 kg 80.6 kg  Examination:  General exam: Appears calm and comfortable  Respiratory system: Clear to auscultation. Respiratory effort normal. No respiratory distress. No conversational dyspnea.  Cardiovascular system: S1 & S2 heard, RRR.  Central nervous system: Alert and oriented. No focal neurological deficits. Speech clear.  Extremities: Symmetric in appearance  Skin: No rashes, lesions or ulcers on exposed skin, lower extremities bandaged/dressed Psychiatry: Judgement and insight appear normal. Mood & affect appropriate.   Data Reviewed: I have personally reviewed following labs and imaging studies  No recent/pertinent studies.  Scheduled Meds:  allopurinol    100 mg Oral q morning   apixaban   5 mg Oral BID   atorvastatin   80 mg Oral Daily   furosemide   40 mg Oral Daily   gabapentin   100 mg Oral TID   metoprolol  succinate  50 mg Oral Daily   pantoprazole   40 mg Oral Daily   potassium chloride   20 mEq Oral QODAY   Continuous Infusions:   LOS: 10 days    Time spent: 25 minutes   [name], DO [facility] 02/12/2024, 2:36 PM   Available via Epic secure chat 7am-7pm After these hours, please refer to coverage provider listed on [email or web address]  "

## 2024-02-12 NOTE — TOC Progression Note (Signed)
 Transition of Care [facility]) - Progression Note    Patient Details  Name: Christine Jacobson MRN: [id number] Date of Birth: [birth year]  [facility]) CM/SW Contact  [name], LCSW Phone Number: 02/12/2024, 11:27 AM  Clinical Narrative:    CSW spoke with the pt to inform her of the insurance denial. CSW suggested that the pt contact [employer] for additional information regarding the appeal process, as the facility that initiated the authorization reported that no information was provided.  The pt reported that she was told they would contact the MD to discuss the need for rehabilitation.   CSW contacted Shona at [facility]. Shona reported that she did not receive a call regarding the pts denial but did receive an email. She stated that a peer-to-peer review was not offered; however, she will call and attempt to resubmit the authorization. At this time, the pts authorization is pending. ICM to follow.   Expected Discharge Plan: Skilled Nursing Facility Barriers to Discharge: Barriers Resolved               Expected Discharge Plan and Services       Living arrangements for the past 2 months: Apartment                                       Social Drivers of Health (SDOH) Interventions SDOH Screenings   Food Insecurity: No Food Insecurity (02/03/2024)  Housing: Low Risk (02/03/2024)  Transportation Needs: No Transportation Needs (02/03/2024)  Utilities: Not At Risk (02/03/2024)  Depression (PHQ2-9): Low Risk (10/26/2021)  Financial Resource Strain: Low Risk (12/03/2021)  Social Connections: Socially Isolated (02/03/2024)  Tobacco Use: Medium Risk (02/02/2024)    Readmission Risk Interventions    11/16/2023   10:02 AM 05/12/2023    4:15 PM 12/28/2022   11:28 AM  Readmission Risk Prevention Plan  Transportation Screening Complete Complete Complete  PCP or Specialist Appt within 3-5 Days Complete  Complete  HRI or Home Care Consult Complete   Complete  Social Work Consult for Recovery Care Planning/Counseling Complete  Complete  Palliative Care Screening Not Applicable  Not Applicable  Medication Review Oceanographer)  Complete Complete  HRI or Home Care Consult  Complete   Palliative Care Screening  Not Applicable   Skilled Nursing Facility  Not Applicable

## 2024-02-12 NOTE — Progress Notes (Signed)
 PT Cancellation Note  Patient Details Name: Christine Jacobson MRN: [id number] DOB: 1955/02/09   Cancelled Treatment:     End of the day, Pt c/o fatigue requesting to come back tomorrow.  LPT has rec [facility].   Katheryn Leap  PTA Acute  Rehabilitation Services Office M-F          [phone]

## 2024-02-13 DIAGNOSIS — L03115 Cellulitis of right lower limb: Secondary | ICD-10-CM | POA: Diagnosis not present

## 2024-02-13 NOTE — Progress Notes (Signed)
 Occupational Therapy Treatment Patient Details Name: Christine Jacobson MRN: [id number] DOB: [birth year] Today's Date: 02/13/2024   History of present illness 69 year old female admitted on 02/02/24 for Cellulitis and erythema of the right lower extremity, chronic lymphedema. PMHx: diastolic congestive heart failure, HOCM, hyperlipidemia, A-fib on Eliquis , hypertension, obesity, gout and recurrent cellulitis   OT comments  Pt c/o RLE pain, increases with OOB activities. Pt agreeable to OOB activities. Pt finishing lunch upon entry. Pt requires no physical assist to get in/out of bed, able to don/doff socks sitting EOB, mild difficulty due to dressing on RLE. Pt stands with CGA using RW for support. Able to ambulate 80 feet with increased time, RW for support, noticeable R limp due to RLE pain that worsened with time. Pt educated on BUE exercises with red theraband. Pt would benefit from continued acute OT to maximize independence with ADLs, functional strength, and activity tolerance, DC plan to postacute rehab <3hrs/day still appropriate.       If plan is discharge home, recommend the following:  A little help with walking and/or transfers;A little help with bathing/dressing/bathroom;Assistance with cooking/housework;Help with stairs or ramp for entrance;Assist for transportation   Equipment Recommendations  Tub/shower bench    Recommendations for Other Services      Precautions / Restrictions Precautions Precautions: Fall Recall of Precautions/Restrictions: Intact Restrictions Weight Bearing Restrictions Per Provider Order: No       Mobility Bed Mobility Overal bed mobility: Needs Assistance Bed Mobility: Supine to Sit     Supine to sit: Supervision     General bed mobility comments: supervision for safety    Transfers Overall transfer level: Needs assistance Equipment used: Rolling walker (2 wheels) Transfers: Sit to/from Stand Sit to Stand: Contact guard assist                  Balance Overall balance assessment: Mild deficits observed, not formally tested                                         ADL either performed or assessed with clinical judgement   ADL Overall ADL's : Needs assistance/impaired Eating/Feeding: Independent   Grooming: Supervision/safety;Standing           Upper Body Dressing : Set up;Sitting;Standing   Lower Body Dressing: Minimal assistance;Sitting/lateral leans;Sit to/from stand   Toilet Transfer: Contact guard assist;Rolling walker (2 wheels)           Functional mobility during ADLs: Contact guard assist;Rolling walker (2 wheels) General ADL Comments: Pt min A for LB ADLs, BLE swelling and RLE pain with upright activities. CGA for STS and ambulation with RW 80 feet.    Extremity/Trunk Assessment Upper Extremity Assessment Upper Extremity Assessment: Overall WFL for tasks assessed            Vision       Perception     Praxis     Communication Communication Communication: No apparent difficulties   Cognition Arousal: Alert Behavior During Therapy: WFL for tasks assessed/performed                                 Following commands: Intact        Cueing   Cueing Techniques: Verbal cues  Exercises      Shoulder Instructions       General Comments BLE swelling/redness,  LLE turned red/purple after sitting/standing for a few seconds, but Pt able to participate as needed.    Pertinent Vitals/ Pain          Home Living                                          Prior Functioning/Environment              Frequency  Min 2X/week        Progress Toward Goals  OT Goals(current goals can now be found in the care plan section)  Progress towards OT goals: Progressing toward goals  Acute Rehab OT Goals Patient Stated Goal: to manage pain OT Goal Formulation: With patient Time For Goal Achievement: 02/18/24 Potential to Achieve  Goals: Good ADL Goals Pt Will Perform Grooming: with modified independence;standing Pt Will Perform Lower Body Dressing: with modified independence;sit to/from stand Pt Will Transfer to Toilet: with modified independence;ambulating;grab bars Pt Will Perform Toileting - Clothing Manipulation and hygiene: with modified independence;sit to/from stand  Plan      Co-evaluation                 AM-PAC OT 6 Clicks Daily Activity     Outcome Measure   Help from another person eating meals?: None Help from another person taking care of personal grooming?: A Little Help from another person toileting, which includes using toliet, bedpan, or urinal?: A Little Help from another person bathing (including washing, rinsing, drying)?: A Little Help from another person to put on and taking off regular upper body clothing?: A Little Help from another person to put on and taking off regular lower body clothing?: A Little 6 Click Score: 19    End of Session Equipment Utilized During Treatment: Rolling walker (2 wheels)  OT Visit Diagnosis: Muscle weakness (generalized) (M62.81);Pain Pain - Right/Left: Right Pain - part of body: Leg   Activity Tolerance Patient tolerated treatment well   Patient Left in chair;with call bell/phone within reach   Nurse Communication Mobility status        Time: 8780-8751 OT Time Calculation (min): 29 min  Charges: OT General Charges $OT Visit: 1 Visit OT Treatments $Self Care/Home Management : 8-22 mins $Therapeutic Activity: 8-22 mins  Christine Jacobson, OTR/L   Christine Jacobson 02/13/2024, 12:55 PM

## 2024-02-13 NOTE — Progress Notes (Signed)
 " PROGRESS NOTE    Christine Jacobson  FMW:[id number] DOB: Jan 01, 1956 DOA: 02/02/2024 PCP: [name], NP     Brief Narrative:  Christine Jacobson is a 69 year old with history of diastolic congestive heart failure, HOCM, hyperlipidemia, A-fib on Eliquis , hypertension and obesity, gout and recurrent cellulitis presented for worsening lower extremity swelling and erythema also reported mild shortness of breath, mostly leg pain and spasms and intermittent chills.  In the emergency room initially controlled heart rate, developed rapid A-fib overnight improved with metoprolol .  proBNP 3554.  WC count was 11.7.  Electrolytes are adequate.  Admitted with IV antibiotics, also given IV diuresis with fluid overload.   New events last 24 hours / Subjective: Insurance auth denied for SNF. Appeal in process. Discussed with patient that her last PT eval was 1/22 and needs to work with them for updated progress note. If patient is improved, she might be able to go home with home health instead of SNF.   Assessment & Plan:   Principal Problem:   Cellulitis of right lower extremity Active Problems:   Mixed hyperlipidemia   Essential hypertension   Acute on chronic diastolic heart failure (HCC)   Gastroesophageal reflux disease   Chronic gout without tophus   Right lower extremity cellulitis Chronic lymphedema - Appreciate wound care RN recommendation for local wound care - Now on doxycycline   Acute on chronic diastolic CHF History of HOCM - Lasix , Toprol   Persistent A-fib - Toprol , Eliquis   Hyperlipidemia - Lipitor   Gout - Allopurinol   GERD - Protonix   DVT prophylaxis:  apixaban  (ELIQUIS ) tablet 5 mg  Code Status: DNR Family Communication: None Disposition Plan: SNF pending placement, insurance auth denied  Status is: Inpatient Remains inpatient appropriate because: PT re-eval needed     Antimicrobials:  Anti-infectives (From admission, onward)    Start     Dose/Rate  Route Frequency Ordered Stop   02/05/24 1145  doxycycline  (VIBRA -TABS) tablet 100 mg        100 mg Oral Every 12 hours 02/05/24 1058 02/10/24 1046   02/03/24 1400  vancomycin  (VANCOREADY) IVPB 1250 mg/250 mL  Status:  Discontinued        1,250 mg 166.7 mL/hr over 90 Minutes Intravenous Every 24 hours 02/02/24 1848 02/05/24 1058   02/02/24 1830  vancomycin  (VANCOREADY) IVPB 1250 mg/250 mL  Status:  Discontinued        1,250 mg 166.7 mL/hr over 90 Minutes Intravenous Every 24 hours 02/02/24 1755 02/02/24 1848   02/02/24 1315  vancomycin  (VANCOCIN ) IVPB 1000 mg/200 mL premix        1,000 mg 200 mL/hr over 60 Minutes Intravenous  Once 02/02/24 1314 02/02/24 1518        Objective: Vitals:   02/12/24 1930 02/13/24 0454 02/13/24 0500 02/13/24 0938  BP: 130/87 135/79  98/74  Pulse: 83 75  76  Resp: 16 18  17   Temp: 98.1 F (36.7 C) 98.3 F (36.8 C)  97.9 F (36.6 C)  TempSrc: Oral   Oral  SpO2: 96% 93%  97%  Weight:   81.1 kg   Height:        Intake/Output Summary (Last 24 hours) at 02/13/2024 1247 Last data filed at 02/12/2024 1300 Gross per 24 hour  Intake 240 ml  Output --  Net 240 ml   Filed Weights   02/11/24 0500 02/12/24 0500 02/13/24 0500  Weight: 78 kg 80.6 kg 81.1 kg   Examination: General exam: Appears calm and comfortable  Respiratory system:  Clear to auscultation. Respiratory effort normal. Cardiovascular system: S1 & S2 heard, RRR. No pedal edema. Gastrointestinal system: Abdomen is nondistended, soft and nontender. Normal bowel sounds heard. Central nervous system: Alert and oriented. Non focal exam. Speech clear  Extremities: Symmetric in appearance bilaterally  Psychiatry: Judgement and insight appear stable. Mood & affect appropriate.     Data Reviewed: I have personally reviewed following labs and imaging studies  CBC: No results for input(s): WBC, NEUTROABS, HGB, HCT, MCV, PLT in the last 168 hours.  Basic Metabolic Panel: No results  for input(s): NA, K, CL, CO2, GLUCOSE, BUN, CREATININE, CALCIUM , MG, PHOS in the last 168 hours.  GFR: Estimated Creatinine Clearance: 52.4 mL/min (A) (by C-G formula based on SCr of 1.03 mg/dL (H)). Liver Function Tests: No results for input(s): AST, ALT, ALKPHOS, BILITOT, PROT, ALBUMIN  in the last 168 hours.  No results for input(s): LIPASE, AMYLASE in the last 168 hours. No results for input(s): AMMONIA in the last 168 hours. Coagulation Profile: No results for input(s): INR, PROTIME in the last 168 hours. Cardiac Enzymes: No results for input(s): CKTOTAL, CKMB, CKMBINDEX, TROPONINI in the last 168 hours. BNP (last 3 results) Recent Labs    10/12/23 0458 01/04/24 0850 02/02/24 1412  PROBNP 4,823.0* 2,651.0* 3,554.0*   HbA1C: No results for input(s): HGBA1C in the last 72 hours. CBG: No results for input(s): GLUCAP in the last 168 hours.  Lipid Profile: No results for input(s): CHOL, HDL, LDLCALC, TRIG, CHOLHDL, LDLDIRECT in the last 72 hours. Thyroid Function Tests: No results for input(s): TSH, T4TOTAL, FREET4, T3FREE, THYROIDAB in the last 72 hours. Anemia Panel: No results for input(s): VITAMINB12, FOLATE, FERRITIN, TIBC, IRON , RETICCTPCT in the last 72 hours. Sepsis Labs: No results for input(s): PROCALCITON, LATICACIDVEN in the last 168 hours.   No results found for this or any previous visit (from the past 240 hours).    Radiology Studies: No results found.    Scheduled Meds:  allopurinol   100 mg Oral q morning   apixaban   5 mg Oral BID   atorvastatin   80 mg Oral Daily   furosemide   40 mg Oral Daily   gabapentin   100 mg Oral TID   metoprolol  succinate  50 mg Oral Daily   pantoprazole   40 mg Oral Daily   potassium chloride   20 mEq Oral QODAY   Continuous Infusions:   LOS: 11 days   Time spent: 25 minutes   Delon Hoe, DO [facility] 02/13/2024,  12:47 PM   Available via Epic secure chat 7am-7pm After these hours, please refer to coverage provider listed on [email or web address]  "

## 2024-02-13 NOTE — Progress Notes (Signed)
 PT Cancellation Note  Patient Details Name: Christine Jacobson MRN: [id number] DOB: September 27, 1955   Cancelled Treatment:     Pt just got back to bed from bathroom.  I had a little of a mess.  Pt requesting to rest.  Did work with OT earlier.  Rehab Team to continue to follow as LPT has rec [facility] Rehab at [facility].  Katheryn Leap  PTA Acute  Rehabilitation Services Office M-F          [phone]

## 2024-02-14 MED ORDER — GABAPENTIN 100 MG PO CAPS: 100.0000 mg | ORAL_CAPSULE | Freq: Three times a day (TID) | ORAL | Status: AC

## 2024-02-14 MED ORDER — OXYCODONE HCL 5 MG PO TABS: 5.0000 mg | ORAL_TABLET | ORAL | 0 refills | Status: AC | PRN

## 2024-02-14 NOTE — Plan of Care (Signed)

## 2024-02-14 NOTE — Plan of Care (Signed)
  Problem: Education: Goal: Knowledge of General Education information will improve Description: Including pain rating scale, medication(s)/side effects and non-pharmacologic comfort measures Outcome: Progressing   Problem: Clinical Measurements: Goal: Ability to maintain clinical measurements within normal limits will improve Outcome: Progressing Goal: Will remain free from infection Outcome: Progressing Goal: Diagnostic test results will improve Outcome: Progressing Goal: Respiratory complications will improve Outcome: Progressing Goal: Cardiovascular complication will be avoided Outcome: Progressing   Problem: Pain Managment: Goal: General experience of comfort will improve and/or be controlled Outcome: Progressing   Problem: Safety: Goal: Ability to remain free from injury will improve Outcome: Progressing   Problem: Skin Integrity: Goal: Risk for impaired skin integrity will decrease Outcome: Progressing

## 2024-02-14 NOTE — Progress Notes (Signed)
 Physical Therapy Treatment Patient Details Name: Christine Jacobson MRN: [id number] DOB: [birth year] Today's Date: 02/14/2024   History of Present Illness 69 year old female admitted on 02/02/24 for Cellulitis and erythema of the right lower extremity, chronic lymphedema. PMHx: diastolic congestive heart failure, HOCM, hyperlipidemia, A-fib on Eliquis , hypertension, obesity, gout and recurrent cellulitis    PT Comments  AxO x 3  following all instructions.  Max encouragement to participate. Presented with increased frustration.  Stated she was suppose to walk at 3 pm.  Pt self able to rise OOB.  Declined to wear footie socks (Hx Cellulitis) and declined to wear safety belt.  Tolerated a functional distance with walker. Self able to get back into bed.  Pt asking for red weights.  Did issue her a RED [employer]. Per chart review, Pt awaiting INS approval for SNF.    If plan is discharge home, recommend the following: A little help with walking and/or transfers;A little help with bathing/dressing/bathroom;Help with stairs or ramp for entrance   Can travel by private vehicle     Yes  Equipment Recommendations  None recommended by PT    Recommendations for Other Services       Precautions / Restrictions Precautions Precautions: Fall Restrictions Weight Bearing Restrictions Per Provider Order: No     Mobility  Bed Mobility Overal bed mobility: Modified Independent             General bed mobility comments: self able    Transfers Overall transfer level: Modified independent                 General transfer comment: self able    Ambulation/Gait Ambulation/Gait assistance: Supervision Gait Distance (Feet): 30 Feet Assistive device: Rolling walker (2 wheels) Gait Pattern/deviations: Step-through pattern Gait velocity: WNL     General Gait Details: Limited activity tolerance.  Required one standing rest break.  Dyspnea 2/4.  Deconditioned.  HIGH FALL RISK due to  impaired self safety.  Declined to wear socks and declined to have Therapist apply safety belt.  I don't need that.  Let's go, stated Pt.   Stairs             Wheelchair Mobility     Tilt Bed    Modified Rankin (Stroke Patients Only)       Balance                                            Communication Communication Communication: No apparent difficulties  Cognition Arousal: Alert Behavior During Therapy: WFL for tasks assessed/performed   PT - Cognitive impairments: No apparent impairments                       PT - Cognition Comments: AxO x 3  following all instructions.  Max encouragement to participate. Following commands: Intact      Cueing Cueing Techniques: Verbal cues  Exercises      General Comments        Pertinent Vitals/Pain Pain Assessment Pain Assessment: No/denies pain    Home Living                          Prior Function            PT Goals (current goals can now be found in the care plan section) Progress towards PT  goals: Progressing toward goals    Frequency    Min 2X/week      PT Plan      Co-evaluation              AM-PAC PT 6 Clicks Mobility   Outcome Measure  Help needed turning from your back to your side while in a flat bed without using bedrails?: None Help needed moving from lying on your back to sitting on the side of a flat bed without using bedrails?: None Help needed moving to and from a bed to a chair (including a wheelchair)?: None Help needed standing up from a chair using your arms (e.g., wheelchair or bedside chair)?: None Help needed to walk in hospital room?: None Help needed climbing 3-5 steps with a railing? : A Little 6 Click Score: 23    End of Session   Activity Tolerance: Patient limited by fatigue Patient left: in bed;with call bell/phone within reach Nurse Communication: Mobility status PT Visit Diagnosis: Difficulty in walking, not  elsewhere classified (R26.2);Muscle weakness (generalized) (M62.81)     Time: 8443-8390 PT Time Calculation (min) (ACUTE ONLY): 13 min  Charges:    $Gait Training: 8-22 mins PT General Charges $$ ACUTE PT VISIT: 1 Visit                     Katheryn Leap  PTA Acute  Rehabilitation Services Office M-F          [phone]

## 2024-02-14 NOTE — TOC Progression Note (Signed)
 Transition of Care [facility]) - Progression Note    Patient Details  Name: Christine Jacobson MRN: [id number] Date of Birth: [birth year]  [facility] CM/SW Contact  [name] DASEN, RN Phone Number: 02/14/2024, 10:49 AM  Clinical Narrative:    CM called Jame Givens to updated that patient states she received a call from [facility] stating approved for SNF.  CM called [employer] - was not able to verify approval for SNF.   Expected Discharge Plan: Skilled Nursing Facility Barriers to Discharge: Barriers Resolved               Expected Discharge Plan and Services       Living arrangements for the past 2 months: Apartment                                       Social Drivers of Health (SDOH) Interventions SDOH Screenings   Food Insecurity: No Food Insecurity (02/03/2024)  Housing: Low Risk (02/03/2024)  Transportation Needs: No Transportation Needs (02/03/2024)  Utilities: Not At Risk (02/03/2024)  Depression (PHQ2-9): Low Risk (10/26/2021)  Financial Resource Strain: Low Risk (12/03/2021)  Social Connections: Socially Isolated (02/03/2024)  Tobacco Use: Medium Risk (02/02/2024)    Readmission Risk Interventions    11/16/2023   10:02 AM 05/12/2023    4:15 PM 12/28/2022   11:28 AM  Readmission Risk Prevention Plan  Transportation Screening Complete Complete Complete  PCP or Specialist Appt within 3-5 Days Complete  Complete  HRI or Home Care Consult Complete  Complete  Social Work Consult for Recovery Care Planning/Counseling Complete  Complete  Palliative Care Screening Not Applicable  Not Applicable  Medication Review Oceanographer)  Complete Complete  HRI or Home Care Consult  Complete   Palliative Care Screening  Not Applicable   Skilled Nursing Facility  Not Applicable

## 2024-02-14 NOTE — Discharge Summary (Signed)
 "  Physician Discharge Summary  Christine Jacobson FMW:[id number] DOB: [birth year] DOA: 02/02/2024  PCP: [name], NP  Admit date: 02/02/2024 Discharge date: 02/15/2024  Admitted From: home Disposition:  SNF  Recommendations for Outpatient Follow-up:  Follow up with PCP in 1-2 weeks  Home Health: none Equipment/Devices: none  Discharge Condition: stable CODE STATUS: DNR Diet Orders (From admission, onward)     Start     Ordered   02/02/24 1658  Diet Heart Room service appropriate? Yes; Fluid consistency: Thin  Diet effective now       Question Answer Comment  Room service appropriate? Yes   Fluid consistency: Thin      02/02/24 1658           Brief Narrative:  Christine Jacobson is a 69 year old with history of diastolic congestive heart failure, HOCM, hyperlipidemia, A-fib on Eliquis , hypertension and obesity, gout and recurrent cellulitis presented for worsening lower extremity swelling and erythema also reported mild shortness of breath, mostly leg pain and spasms and intermittent chills.  In the emergency room initially controlled heart rate, developed rapid A-fib overnight improved with metoprolol .  proBNP 3554.  WC count was 11.7.  Electrolytes are adequate.  Admitted with IV antibiotics, also given IV diuresis with fluid overload.   Hospital Course / Discharge diagnoses: Principal problem Right lower extremity cellulitis, chronic lymphedema - Appreciate wound care RN recommendation for local wound care, patient was also placed on antibiotics.  Cellulitis improved, he has completed a course of antibiotics with the last doxycycline  dose on 1/24.  He remains stable, afebrile, continue local wound care  Active problems Acute on chronic diastolic CHF, history of HOCM -peers euvolemic, continue home Lasix , Toprol  Persistent A-fib- Toprol , Eliquis  Hyperlipidemia- Lipitor  Gout- Allopurinol  GERD- Protonix   Sepsis ruled out   Discharge Instructions   Allergies  as of 02/15/2024       Reactions   Porcine (pork) Protein-containing Drug Products Other (See Comments)   Religious preference        Medication List     TAKE these medications    acetaminophen  325 MG tablet Commonly known as: TYLENOL  Take 2 tablets (650 mg total) by mouth every 6 (six) hours as needed for mild pain (pain score 1-3) or fever (or Fever >/= 101).   allopurinol  100 MG tablet Commonly known as: ZYLOPRIM  Take 100 mg by mouth every morning.   atorvastatin  80 MG tablet Commonly known as: LIPITOR  Take 1 tablet (80 mg total) by mouth daily.   Eliquis  5 MG Tabs tablet Generic drug: apixaban  TAKE ONE TABLET (5 MG TOTAL) BY MOUTH TWICE DAILY AT 9AM & 5PM (NEEDS CARDIOLOGY APPOINTMENT FOR REFILLS, CALL OFFICE [phone]. THANK YOU) What changed: See the new instructions.   ezetimibe  10 MG tablet Commonly known as: Zetia  Take 1 tablet (10 mg total) by mouth daily for cholesterol.   furosemide  40 MG tablet Commonly known as: Lasix  Take 1 tablet (40 mg total) by mouth in the morning.   gabapentin  100 MG capsule Commonly known as: NEURONTIN  Take 1 capsule (100 mg total) by mouth 3 (three) times daily.   hydrocortisone  cream 1 % Apply 1 Application topically 3 (three) times daily as needed for itching (minor skin irritation).   metoprolol  succinate 50 MG 24 hr tablet Commonly known as: TOPROL -XL Take 1 tablet (50 mg total) by mouth daily. Take with or immediately following a meal.   oxyCODONE  5 MG immediate release tablet Commonly known as: Oxy IR/ROXICODONE  Take 1 tablet (5 mg  total) by mouth every 4 (four) hours as needed for moderate pain (pain score 4-6) or severe pain (pain score 7-10).   pantoprazole  40 MG tablet Commonly known as: PROTONIX  Take 1 tablet (40 mg total) by mouth daily.   Potassium Chloride  ER 20 MEQ Tbcr Take 1 tablet (20 mEq total) by mouth every other day.   spironolactone  25 MG tablet Commonly known as: ALDACTONE  Take 1 tablet (25  mg total) by mouth daily.        Contact information for after-discharge care     Destination     [facility]   Service: Skilled Nursing Contact information: [address] [place]  [phone]                     Consultations: none  Procedures/Studies:  DG Chest Port 1 View Result Date: 02/02/2024 CLINICAL DATA:  Swelling.  Shortness of breath. EXAM: PORTABLE CHEST 1 VIEW COMPARISON:  None Available. FINDINGS: Both lungs are clear. Heart size is upper limits of normal but stable. Trachea is midline. Negative for a pneumothorax. No acute bone abnormality. IMPRESSION: No active disease. Electronically Signed   By: Juliene Balder M.D.   On: 02/02/2024 13:58     Subjective: - no chest pain, shortness of breath, no abdominal pain, nausea or vomiting.   Discharge Exam: BP 97/74 (BP Location: Left Arm)   Pulse 69   Temp 97.6 F (36.4 C) (Oral)   Resp 17   Ht 5' 4 (1.626 m)   Wt 81.2 kg   SpO2 97%   BMI 30.73 kg/m   General: Pt is alert, awake, not in acute distress Cardiovascular: RRR, S1/S2 +, no rubs, no gallops Respiratory: CTA bilaterally, no wheezing, no rhonchi Abdominal: Soft, NT, ND, bowel sounds + Extremities: no edema, no cyanosis    The results of significant diagnostics from this hospitalization (including imaging, microbiology, ancillary and laboratory) are listed below for reference.     Microbiology: No results found for this or any previous visit (from the past 240 hours).   Labs: Basic Metabolic Panel: No results for input(s): NA, K, CL, CO2, GLUCOSE, BUN, CREATININE, CALCIUM , MG, PHOS in the last 168 hours. Liver Function Tests: No results for input(s): AST, ALT, ALKPHOS, BILITOT, PROT, ALBUMIN  in the last 168 hours. CBC: No results for input(s): WBC, NEUTROABS, HGB, HCT, MCV, PLT in the last 168 hours. CBG: No results for input(s):  GLUCAP in the last 168 hours. Hgb A1c No results for input(s): HGBA1C in the last 72 hours. Lipid Profile No results for input(s): CHOL, HDL, LDLCALC, TRIG, CHOLHDL, LDLDIRECT in the last 72 hours. Thyroid function studies No results for input(s): TSH, T4TOTAL, T3FREE, THYROIDAB in the last 72 hours.  Invalid input(s): FREET3 Urinalysis    Component Value Date/Time   COLORURINE YELLOW 01/04/2024 0945   APPEARANCEUR CLEAR 01/04/2024 0945   LABSPEC 1.025 01/04/2024 0945   PHURINE 5.0 01/04/2024 0945   GLUCOSEU NEGATIVE 01/04/2024 0945   HGBUR NEGATIVE 01/04/2024 0945   BILIRUBINUR NEGATIVE 01/04/2024 0945   KETONESUR 5 (A) 01/04/2024 0945   PROTEINUR 100 (A) 01/04/2024 0945   NITRITE NEGATIVE 01/04/2024 0945   LEUKOCYTESUR TRACE (A) 01/04/2024 0945    FURTHER DISCHARGE INSTRUCTIONS:   Get Medicines reviewed and adjusted: Please take all your medications with you for your next visit with your Primary MD   Laboratory/radiological data: Please request your Primary MD to go over all hospital tests and procedure/radiological results at the  follow up, please ask your Primary MD to get all Hospital records sent to his/her office.   In some cases, they will be blood work, cultures and biopsy results pending at the time of your discharge. Please request that your primary care M.D. goes through all the records of your hospital data and follows up on these results.   Also Note the following: If you experience worsening of your admission symptoms, develop shortness of breath, life threatening emergency, suicidal or homicidal thoughts you must seek medical attention immediately by calling 911 or calling your MD immediately  if symptoms less severe.   You must read complete instructions/literature along with all the possible adverse reactions/side effects for all the Medicines you take and that have been prescribed to you. Take any new Medicines after you have  completely understood and accpet all the possible adverse reactions/side effects.    Do not drive when taking Pain medications or sleeping medications (Benzodaizepines)   Do not take more than prescribed Pain, Sleep and Anxiety Medications. It is not advisable to combine anxiety,sleep and pain medications without talking with your primary care practitioner   Special Instructions: If you have smoked or chewed Tobacco  in the last 2 yrs please stop smoking, stop any regular Alcohol  and or any Recreational drug use.   Wear Seat belts while driving.   Please note: You were cared for by a hospitalist during your hospital stay. Once you are discharged, your primary care physician will handle any further medical issues. Please note that NO REFILLS for any discharge medications will be authorized once you are discharged, as it is imperative that you return to your primary care physician (or establish a relationship with a primary care physician if you do not have one) for your post hospital discharge needs so that they can reassess your need for medications and monitor your lab values.  Time coordinating discharge: 35 minutes  SIGNED:  Nilda Fendt, MD, PhD 02/15/2024, 9:06 AM   "

## 2024-02-14 NOTE — NC FL2 (Signed)
 " Christine Jacobson  MEDICAID FL2 LEVEL OF CARE FORM     IDENTIFICATION  Patient Name: Christine Jacobson Birthdate: 05/16/1955 Sex: female Admission Date (Current Location): 02/02/2024  [facility] and [place] Number:  Producer, Television/film/video and Address:  [facility],  [address], [place] [address]      Provider Number: [id number]  Attending Physician Name and Address:  Trixie Nilda HERO, MD  Relative Name and Phone Number:       Current Level of Care: Hospital Recommended Level of Care: Skilled Nursing Facility Prior Approval Number:    Date Approved/Denied:   PASRR Number: [id number] E Expired 03/08/2024  Discharge Plan: SNF    Current Diagnoses: Patient Active Problem List   Diagnosis Date Noted   Cellulitis of right lower extremity 02/02/2024   Gastroesophageal reflux disease 02/02/2024   Chronic gout without tophus 02/02/2024   (HFpEF) heart failure with preserved ejection fraction (HCC) 11/22/2023   Syncope and collapse 11/21/2023   Vulvar cellulitis 11/13/2023   Ovarian mass 11/13/2023   Ovarian mass, left 11/13/2023   Sepsis (HCC) 11/13/2023   Coronary artery calcification seen on CAT scan 10/13/2023   Elevated troponin 10/12/2023   Abnormal stress test 10/12/2023   Acute diastolic (congestive) heart failure (HCC) 10/11/2023   Persistent atrial fibrillation (HCC) 12/28/2022   Acute on chronic diastolic heart failure (HCC) 12/27/2022   Major depressive disorder, 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   MDD (major depressive disorder) 09/29/2022   Anxiety 09/16/2022   Acute exacerbation of CHF (congestive heart failure) (HCC) 09/15/2022   Atrial fibrillation with RVR (HCC) 09/15/2022   Leg swelling 09/15/2022   GIB (gastrointestinal bleeding) 08/05/2022   Acquired thrombophilia 08/05/2022   Chronic anticoagulation 08/05/2022   Acute blood loss anemia 08/04/2022   Left leg cellulitis 07/13/2022   Hypocarbia 07/13/2022    Normocytic anemia 07/13/2022   Cellulitis 06/02/2022   Sepsis due to cellulitis (HCC) 06/02/2022   AKI (acute kidney injury) 06/02/2022   Hyponatremia 06/02/2022   Dehydration 06/02/2022   Metabolic acidosis 06/02/2022   Pulmonary HTN (HCC)    Cellulitis and abscess of left lower extremity 10/13/2021   PAF (paroxysmal atrial fibrillation) (HCC) 07/28/2021   Obesity (BMI 30-39.9)    Liver function test abnormality    Essential hypertension    Chronic diastolic heart failure (HCC) 12/15/2016   HOCM (hypertrophic obstructive cardiomyopathy) (HCC) 01/22/2016   Lower extremity edema 09/17/2014   History of syncope 12/05/2013   Syncope 12/05/2013   Pneumonia 11/23/2012   Shortness of breath 07/11/2011   Hypertension 11/23/2010   Chest pain of uncertain etiology 10/18/2010   Murmur 10/18/2010   Palpitations 09/23/2010   Mixed hyperlipidemia    Elevated BP    Hyperprolactinemia (HCC)    Pyoderma gangrenosa (HCC)     Orientation RESPIRATION BLADDER Height & Weight     Self, Time, Situation, Place  Normal Incontinent Weight: 81.2 kg Height:  5' 4 (162.6 cm)  BEHAVIORAL SYMPTOMS/MOOD NEUROLOGICAL BOWEL NUTRITION STATUS      Continent Diet (heart healthy)  AMBULATORY STATUS COMMUNICATION OF NEEDS Skin   Limited Assist Verbally Normal                       Personal Care Assistance Level of Assistance  Bathing, Feeding, Dressing Bathing Assistance: Limited assistance Feeding assistance: Independent Dressing Assistance: Limited assistance     Functional Limitations Info  Sight, Hearing, Speech Sight Info: Adequate Hearing Info: Adequate Speech Info: Adequate  SPECIAL CARE FACTORS FREQUENCY  PT (By licensed PT), OT (By licensed OT)     PT Frequency: 5 x a week OT Frequency: 5 x a week            Contractures Contractures Info: Not present    Additional Factors Info  Code Status, Allergies Code Status Info: DNR Allergies Info: Porcine (Pork)  Protein-containing Drug Products           Current Medications (02/14/2024):  This is the current hospital active medication list Current Facility-Administered Medications  Medication Dose Route Frequency Provider Last Rate Last Admin   acetaminophen  (TYLENOL ) tablet 650 mg  650 mg Oral Q6H PRN Amponsah, Prosper M, MD   650 mg at 02/13/24 1353   Or   acetaminophen  (TYLENOL ) suppository 650 mg  650 mg Rectal Q6H PRN Amponsah, Prosper M, MD       allopurinol  (ZYLOPRIM ) tablet 100 mg  100 mg Oral q morning Amponsah, Prosper M, MD   100 mg at 02/14/24 9070   apixaban  (ELIQUIS ) tablet 5 mg  5 mg Oral BID Amponsah, Prosper M, MD   5 mg at 02/14/24 9070   atorvastatin  (LIPITOR ) tablet 80 mg  80 mg Oral Daily Amponsah, Prosper M, MD   80 mg at 02/14/24 9070   bisacodyl  (DULCOLAX) EC tablet 5 mg  5 mg Oral Daily PRN Lou Claretta HERO, MD       furosemide  (LASIX ) tablet 40 mg  40 mg Oral Daily Rojelio Nest, DO   40 mg at 02/14/24 9070   gabapentin  (NEURONTIN ) capsule 100 mg  100 mg Oral TID Alto Isaiah CROME, NP   100 mg at 02/14/24 9070   hydrOXYzine  (ATARAX ) tablet 25 mg  25 mg Oral TID PRN Rojelio Nest, DO   25 mg at 02/12/24 2149   metoprolol  succinate (TOPROL -XL) 24 hr tablet 50 mg  50 mg Oral Daily Amponsah, Prosper M, MD   50 mg at 02/14/24 9070   ondansetron  (ZOFRAN ) tablet 4 mg  4 mg Oral Q6H PRN Amponsah, Prosper M, MD       Or   ondansetron  (ZOFRAN ) injection 4 mg  4 mg Intravenous Q6H PRN Amponsah, Prosper M, MD       Oral care mouth rinse  15 mL Mouth Rinse PRN Raenelle Coria, MD       oxyCODONE  (Oxy IR/ROXICODONE ) immediate release tablet 5 mg  5 mg Oral Q4H PRN Frann Mabel Mt, DO   5 mg at 02/14/24 0112   pantoprazole  (PROTONIX ) EC tablet 40 mg  40 mg Oral Daily Amponsah, Prosper M, MD   40 mg at 02/14/24 9070   potassium chloride  (KLOR-CON ) packet 20 mEq  20 mEq Oral QODAY Amponsah, Prosper M, MD   20 mEq at 02/13/24 0856   senna-docusate (Senokot-S) tablet 1 tablet  1  tablet Oral QHS PRN Amponsah, Prosper M, MD         Discharge Medications: Please see discharge summary for a list of discharge medications.  Relevant Imaging Results:  Relevant Lab Results:   Additional Information [id number]  [name] DASEN, RN     "

## 2024-02-15 NOTE — Plan of Care (Addendum)
 RN called to give report to [facility] [phone] for pt to transfer to rm 312; no answer  --------------  Attempted to call a second time; no answer; ambulance picked up pt, pt discharged from hospital.

## 2024-02-15 NOTE — Progress Notes (Signed)
 Occupational Therapy Treatment Patient Details Name: Christine Jacobson MRN: [id number] DOB: Jul 12, 1955 Today's Date: 02/15/2024   History of present illness 69 year old female admitted on 02/02/24 for cellulitis and erythema of the right lower extremity, chronic lymphedema. PMH: diastolic congestive heart failure, HOCM, hyperlipidemia, A-fib on Eliquis , hypertension, obesity, gout and recurrent cellulitis   OT comments  The pt was seen for functional strengthening, functional transfers and progression of standing tolerance needed to facilitate progressive ADL performance. Pt transferred into sitting edge of bed without the need for assistance. She declined to donn socks on her feet for sit to stand and out of bed activity. She subsequently required SBA for functional ambulation into the hall using a RW. She required 1 standing rest break, and she also reported dizziness and nausea with activity. She is making gradual functional progress. Continue OT plan of are. The pt expressed a readiness and desire to discharge to short-term SNF rehab as soon as possible.       If plan is discharge home, recommend the following:  A little help with walking and/or transfers;A little help with bathing/dressing/bathroom;Assistance with cooking/housework;Help with stairs or ramp for entrance;Assist for transportation   Equipment Recommendations  Tub/shower bench    Recommendations for Other Services      Precautions / Restrictions Precautions Precautions: Fall Restrictions Weight Bearing Restrictions Per Provider Order: No       Mobility Bed Mobility Overal bed mobility: Needs Assistance Bed Mobility: Supine to Sit     Supine to sit: Supervision Sit to supine: Supervision        Transfers Overall transfer level: Needs assistance Equipment used: Rolling walker (2 wheels) Transfers: Sit to/from Stand Sit to Stand: Supervision                 Balance     Sitting balance-Leahy Scale:  Good       Standing balance-Leahy Scale: Fair          ADL either performed or assessed with clinical judgement   ADL Overall ADL's : Needs assistance/impaired                 Upper Body Dressing : Set up;Sitting                     Communication Communication Communication: No apparent difficulties   Cognition Arousal: Alert Behavior During Therapy: WFL for tasks assessed/performed      Following commands: Intact        Cueing   Cueing Techniques: Verbal cues             Pertinent Vitals/ Pain       Pain Assessment Pain Assessment: No/denies pain   Frequency  Min 2X/week        Progress Toward Goals  OT Goals(current goals can now be found in the care plan section)  Progress towards OT goals: Progressing toward goals  Acute Rehab OT Goals Patient Stated Goal: to continue to make functional progress OT Goal Formulation: With patient Time For Goal Achievement: 02/18/24 Potential to Achieve Goals: Good  Plan         AM-PAC OT 6 Clicks Daily Activity     Outcome Measure   Help from another person eating meals?: None Help from another person taking care of personal grooming?: A Little Help from another person toileting, which includes using toliet, bedpan, or urinal?: A Little Help from another person bathing (including washing, rinsing, drying)?: A Little Help from another person to put  on and taking off regular upper body clothing?: None Help from another person to put on and taking off regular lower body clothing?: A Little 6 Click Score: 20    End of Session Equipment Utilized During Treatment: Rolling walker (2 wheels)  OT Visit Diagnosis: Muscle weakness (generalized) (M62.81)   Activity Tolerance Patient tolerated treatment well   Patient Left in bed;with call bell/phone within reach   Nurse Communication Mobility status        Time: 8781-8767 OT Time Calculation (min): 14 min  Charges: OT General Charges $OT  Visit: 1 Visit OT Treatments $Therapeutic Activity: 8-22 mins    Delanna JINNY Lesches, OTR/L 02/15/2024, 12:53 PM

## 2024-02-15 NOTE — Plan of Care (Signed)
   Problem: Education: Goal: Knowledge of General Education information will improve Description: Including pain rating scale, medication(s)/side effects and non-pharmacologic comfort measures Outcome: Progressing   Problem: Clinical Measurements: Goal: Will remain free from infection Outcome: Progressing   Problem: Clinical Measurements: Goal: Respiratory complications will improve Outcome: Progressing

## 2024-02-15 NOTE — TOC Transition Note (Signed)
 Transition of Care [facility]) - Discharge Note   Patient Details  Name: Christine Jacobson MRN: [id number] Date of Birth: [birth year]  Transition of [facility]) CM/SW Contact:  [name] DASEN, RN Phone Number: 02/15/2024, 9:45 AM   Clinical Narrative:    Per MD patient ready for DC to [facility]. RN to call report prior to discharge [phone] ext. [phone] Rm 312). RN, patient, patient's family, and facility notified of DC. Discharge Summary and FL2 sent to facility via HUB. DC packet on chart facesheet, medical necessity, DNR, discharge summary and 1 signed prescription. Ambulance [facility] transport requested for patient.   CM signing off. Please consult us  again if new needs arise.     Final next level of care: Skilled Nursing Facility Barriers to Discharge: Barriers Resolved   Patient Goals and CMS Choice Patient states their goals for this hospitalization and ongoing recovery are:: [facility].gov Compare Post Acute Care list provided to:: Patient Choice offered to / list presented to : Patient [place] ownership interest in [facility].provided to:: Patient    Discharge Placement   Existing PASRR number confirmed : 02/15/24          Patient chooses bed at: [facility] Patient to be transferred to facility by: [facility] Name of family member notified: gari rock Benne, Emergency Contact  [phone] per pt Patient and family notified of of transfer: 02/15/24  Discharge Plan and Services Additional resources added to the After Visit Summary for                  DME Arranged: N/A DME Agency: NA       HH Arranged: NA HH Agency: NA        Social Drivers of Health (SDOH) Interventions SDOH Screenings   Food Insecurity: No Food Insecurity (02/03/2024)  Housing: Low Risk (02/03/2024)  Transportation Needs: No Transportation Needs (02/03/2024)  Utilities: Not At Risk (02/03/2024)  Depression (PHQ2-9): Low Risk (10/26/2021)  Financial  Resource Strain: Low Risk (12/03/2021)  Social Connections: Socially Isolated (02/03/2024)  Tobacco Use: Medium Risk (02/02/2024)     Readmission Risk Interventions    11/16/2023   10:02 AM 05/12/2023    4:15 PM 12/28/2022   11:28 AM  Readmission Risk Prevention Plan  Transportation Screening Complete Complete Complete  PCP or Specialist Appt within 3-5 Days Complete  Complete  HRI or Home Care Consult Complete  Complete  Social Work Consult for Recovery Care Planning/Counseling Complete  Complete  Palliative Care Screening Not Applicable  Not Applicable  Medication Review Oceanographer)  Complete Complete  HRI or Home Care Consult  Complete   Palliative Care Screening  Not Applicable   Skilled Nursing Facility  Not Applicable

## 2024-02-15 NOTE — Progress Notes (Signed)
 Seen and examined this morning, stable for discharge, please refer to discharge summary signed yesterday.  Khyson Sebesta M. Trixie, MD, PhD [facility]  Between 7 am - 7 pm you can contact me via Amion (for emergencies) or Securechat (non urgent matters).  I am not available 7 pm - 7 am, please contact night coverage MD/APP via Amion
